# Patient Record
Sex: Male | Born: 1970
Health system: Southern US, Community
[De-identification: ages and names within clinical notes are randomized; demographics above are authoritative.]

## PROBLEM LIST (undated history)

## (undated) DIAGNOSIS — M51369 Other intervertebral disc degeneration, lumbar region without mention of lumbar back pain or lower extremity pain: Secondary | ICD-10-CM

## (undated) DIAGNOSIS — M722 Plantar fascial fibromatosis: Secondary | ICD-10-CM

## (undated) DIAGNOSIS — D1803 Hemangioma of intra-abdominal structures: Secondary | ICD-10-CM

## (undated) DIAGNOSIS — M1990 Unspecified osteoarthritis, unspecified site: Secondary | ICD-10-CM

## (undated) DIAGNOSIS — G4733 Obstructive sleep apnea (adult) (pediatric): Secondary | ICD-10-CM

## (undated) DIAGNOSIS — IMO0002 Reserved for concepts with insufficient information to code with codable children: Secondary | ICD-10-CM

## (undated) DIAGNOSIS — I10 Essential (primary) hypertension: Secondary | ICD-10-CM

## (undated) DIAGNOSIS — B350 Tinea barbae and tinea capitis: Secondary | ICD-10-CM

## (undated) DIAGNOSIS — G4730 Sleep apnea, unspecified: Secondary | ICD-10-CM

## (undated) DIAGNOSIS — M5136 Other intervertebral disc degeneration, lumbar region: Secondary | ICD-10-CM

## (undated) DIAGNOSIS — E663 Overweight: Secondary | ICD-10-CM

## (undated) HISTORY — DX: Other intervertebral disc degeneration, lumbar region without mention of lumbar back pain or lower extremity pain: M51.369

## (undated) HISTORY — PX: SPINE SURGERY: SHX786

## (undated) HISTORY — DX: Tinea barbae and tinea capitis: B35.0

## (undated) HISTORY — DX: Morbid (severe) obesity due to excess calories: E66.01

## (undated) HISTORY — DX: Other intervertebral disc degeneration, lumbar region: M51.36

## (undated) HISTORY — DX: Unspecified osteoarthritis, unspecified site: M19.90

## (undated) HISTORY — DX: Obstructive sleep apnea (adult) (pediatric): G47.33

## (undated) HISTORY — DX: Sleep apnea, unspecified: G47.30

## (undated) HISTORY — DX: Hemangioma of intra-abdominal structures: D18.03

## (undated) HISTORY — DX: Overweight: E66.3

## (undated) HISTORY — PX: APPENDECTOMY: SHX54

---

## 2003-12-27 HISTORY — PX: ROTATOR CUFF REPAIR: SHX139

## 2004-08-06 ENCOUNTER — Emergency Department (HOSPITAL_COMMUNITY): Admission: EM | Admit: 2004-08-06 | Discharge: 2004-08-06 | Payer: Self-pay | Admitting: Emergency Medicine

## 2005-07-15 ENCOUNTER — Ambulatory Visit: Payer: Self-pay | Admitting: Pulmonary Disease

## 2005-12-26 HISTORY — PX: LAPAROSCOPIC APPENDECTOMY: SHX408

## 2006-03-11 ENCOUNTER — Emergency Department (HOSPITAL_COMMUNITY): Admission: AD | Admit: 2006-03-11 | Discharge: 2006-03-11 | Payer: Self-pay | Admitting: Family Medicine

## 2006-05-25 ENCOUNTER — Observation Stay (HOSPITAL_COMMUNITY): Admission: EM | Admit: 2006-05-25 | Discharge: 2006-05-26 | Payer: Self-pay | Admitting: Family Medicine

## 2006-05-25 ENCOUNTER — Encounter (INDEPENDENT_AMBULATORY_CARE_PROVIDER_SITE_OTHER): Payer: Self-pay | Admitting: *Deleted

## 2006-06-02 ENCOUNTER — Encounter: Admission: RE | Admit: 2006-06-02 | Discharge: 2006-06-02 | Payer: Self-pay

## 2007-05-01 ENCOUNTER — Ambulatory Visit: Payer: Self-pay | Admitting: Pulmonary Disease

## 2007-06-13 ENCOUNTER — Ambulatory Visit (HOSPITAL_BASED_OUTPATIENT_CLINIC_OR_DEPARTMENT_OTHER): Admission: RE | Admit: 2007-06-13 | Discharge: 2007-06-13 | Payer: Self-pay | Admitting: Pulmonary Disease

## 2007-06-30 ENCOUNTER — Ambulatory Visit: Payer: Self-pay | Admitting: Pulmonary Disease

## 2007-08-13 ENCOUNTER — Ambulatory Visit: Payer: Self-pay | Admitting: Pulmonary Disease

## 2008-02-25 ENCOUNTER — Emergency Department (HOSPITAL_COMMUNITY): Admission: EM | Admit: 2008-02-25 | Discharge: 2008-02-25 | Payer: Self-pay | Admitting: Emergency Medicine

## 2009-03-04 ENCOUNTER — Ambulatory Visit: Payer: Self-pay | Admitting: Pulmonary Disease

## 2009-03-04 DIAGNOSIS — M51379 Other intervertebral disc degeneration, lumbosacral region without mention of lumbar back pain or lower extremity pain: Secondary | ICD-10-CM | POA: Insufficient documentation

## 2009-03-04 DIAGNOSIS — D1803 Hemangioma of intra-abdominal structures: Secondary | ICD-10-CM

## 2009-03-04 DIAGNOSIS — M5137 Other intervertebral disc degeneration, lumbosacral region: Secondary | ICD-10-CM

## 2009-03-04 DIAGNOSIS — G4733 Obstructive sleep apnea (adult) (pediatric): Secondary | ICD-10-CM | POA: Insufficient documentation

## 2009-03-04 DIAGNOSIS — E663 Overweight: Secondary | ICD-10-CM | POA: Insufficient documentation

## 2009-03-04 DIAGNOSIS — B350 Tinea barbae and tinea capitis: Secondary | ICD-10-CM

## 2009-03-04 DIAGNOSIS — M25569 Pain in unspecified knee: Secondary | ICD-10-CM | POA: Insufficient documentation

## 2009-07-27 ENCOUNTER — Encounter: Payer: Self-pay | Admitting: Pulmonary Disease

## 2009-12-28 ENCOUNTER — Encounter: Admission: RE | Admit: 2009-12-28 | Discharge: 2009-12-28 | Payer: Self-pay | Admitting: Occupational Medicine

## 2009-12-29 ENCOUNTER — Encounter: Admission: RE | Admit: 2009-12-29 | Discharge: 2009-12-29 | Payer: Self-pay | Admitting: Occupational Medicine

## 2010-06-09 ENCOUNTER — Ambulatory Visit: Payer: Self-pay | Admitting: Pulmonary Disease

## 2010-06-23 ENCOUNTER — Encounter: Payer: Self-pay | Admitting: Pulmonary Disease

## 2010-09-06 ENCOUNTER — Telehealth (INDEPENDENT_AMBULATORY_CARE_PROVIDER_SITE_OTHER): Payer: Self-pay | Admitting: *Deleted

## 2011-01-16 ENCOUNTER — Encounter: Payer: Self-pay | Admitting: Pulmonary Disease

## 2011-01-25 NOTE — Progress Notes (Signed)
Summary: requesting rx - LMTCB x 2  Phone Note Call from Patient Call back at [phone]   Caller: Spouse-Laura Call For: Nicholas Stevens Reason for Call: Talk to Nurse Summary of Call: Drainage at throat,light headache, sinus infection, cough.  Can you call something in? [facility] Initial call taken by: Eugene Gavia,  September 06, 2010 3:27 PM  Follow-up for Phone Call        ATC above number - [facility] Crystal Yetta Barre RN  September 06, 2010 3:30 PM  LMTCBx2 at number given. Carron Curie CMA  September 07, 2010 10:38 AM  Pt c/o sinus headache, yellow nasal dranage, dry hacky cough for 2-3 days. Would like RX called to pharmacy. Please advise.Michel Bickers [facility]  September 07, 2010 12:18 PM  Additional Follow-up for Phone Call Additional follow up Details #1::        per SN----ok for pt to have augmentin 875mg   #20  1 by mouth two times a day until gone, use mucinex max otc  1 by mouth two times a day with plenty of fluids.  thanks. Randell Loop [facility]  September 07, 2010 1:42 PM     Additional Follow-up for Phone Call Additional follow up Details #2::    Rx was sent.  Spoke with pt's spouse Vernona Rieger and notified of the above recs per SN. Follow-up by: Vernie Murders,  September 07, 2010 2:00 PM  New/Updated Medications: AUGMENTIN 875-125 MG TABS (AMOXICILLIN-POT CLAVULANATE) 1 by mouth two times a day until gone Prescriptions: AUGMENTIN 875-125 MG TABS (AMOXICILLIN-POT CLAVULANATE) 1 by mouth two times a day until gone  #20 x 0   Entered by:   Vernie Murders   Authorized by:   [name] MD   Signed by:   Vernie Murders on 09/07/2010   Method used:   [facility]        [facility]  [address] [phone] (retail)       [address]       [place], [place]  [address]       Ph: [phone]       Fax: [phone]   RxID:   [id number]
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
## 2011-01-25 NOTE — Letter (Signed)
Summary: [facility]  [facility]   Imported By: Lester Franklin 07/08/2010 09:40:55  _____________________________________________________________________  External Attachment:    Type:   Image     Comment:   External Document

## 2011-04-06 ENCOUNTER — Ambulatory Visit: Payer: Self-pay | Admitting: Pulmonary Disease

## 2011-04-25 ENCOUNTER — Encounter: Payer: Self-pay | Admitting: Pulmonary Disease

## 2011-04-27 ENCOUNTER — Ambulatory Visit (INDEPENDENT_AMBULATORY_CARE_PROVIDER_SITE_OTHER): Payer: 59 | Admitting: Pulmonary Disease

## 2011-04-27 ENCOUNTER — Encounter: Payer: Self-pay | Admitting: Pulmonary Disease

## 2011-04-27 DIAGNOSIS — M5137 Other intervertebral disc degeneration, lumbosacral region: Secondary | ICD-10-CM

## 2011-04-27 DIAGNOSIS — D1803 Hemangioma of intra-abdominal structures: Secondary | ICD-10-CM

## 2011-04-27 DIAGNOSIS — M51379 Other intervertebral disc degeneration, lumbosacral region without mention of lumbar back pain or lower extremity pain: Secondary | ICD-10-CM

## 2011-04-27 DIAGNOSIS — G4733 Obstructive sleep apnea (adult) (pediatric): Secondary | ICD-10-CM

## 2011-04-27 DIAGNOSIS — R41840 Attention and concentration deficit: Secondary | ICD-10-CM | POA: Insufficient documentation

## 2011-04-27 DIAGNOSIS — Z0000 Encounter for general adult medical examination without abnormal findings: Secondary | ICD-10-CM

## 2011-04-27 DIAGNOSIS — M25569 Pain in unspecified knee: Secondary | ICD-10-CM

## 2011-04-27 DIAGNOSIS — E663 Overweight: Secondary | ICD-10-CM

## 2011-04-27 NOTE — Patient Instructions (Signed)
Big-D, it was good seeing you again...  We decided to check your CPAP apparatus & we will ask [facility] to contact you about this...  Please return to our lab one morning soon for FASTING blood work...    Then call the PHONE TREE in a few days for your results...    Dial [id number] & when prompted enter your patient number followed by the # symbol...    Your patient number is:  [id number]#  Try some of our tricks regarding weight loss... Call for any concerns or if you want to proceed w/ the Neurology eval at any time.Marland KitchenMarland Kitchen

## 2011-05-08 ENCOUNTER — Encounter: Payer: Self-pay | Admitting: Pulmonary Disease
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
## 2011-05-10 NOTE — Assessment & Plan Note (Signed)
[facility]                             PULMONARY OFFICE NOTE   NAME:Nicholas Stevens, Bean                  MRN:          [id number]  DATE:08/13/2007                            DOB:          04/18/1971    HISTORY OF PRESENT ILLNESS:  Patient is a very pleasant 40 year old  black gentleman who I have been asked to see for sleep apnea.  Patient  underwent nocturnal polysomnography on June 13, 2007 where he was found  to have an apnea-hypopnea index of 53 events per hour and O2  desaturation as low as 76% with his events.  Patient was placed on CPAP  at a level of 10 and asked to follow up with me.  Prior to CPAP  initiation patient had loud snoring as well as pauses in sleep according  to his wife.  He typically went to bed between 11 and 12 and got up at 4  a.m. to start his day.  He felt rested and that he did not have any  inappropriate daytime sleepiness.  The patient works as a Physicist, medical.  Of note, the patient's weight is up about 40 pounds from 2  years ago.  Patient was started on CPAP at a pressure of 10 with a nasal  CPAP mask.  Despite this he feels like he has smothering.  He has the  ramp button set to 45 minutes but still can not initiate sleep even over  this time period.  He has only used 3 nights over the last 1 month.  Patient feels the mask is comfortable and without significant leaks.   PAST MEDICAL HISTORY:  1. Appendectomy in 2007.  2. Sleep apnea, stated above.  3. Hypertension.   CURRENT MEDICATIONS:  None.   PATIENT HAS NO KNOWN DRUG ALLERGIES.   SOCIAL HISTORY:  He has never smoked, he is married, he works as a  Emergency planning/management officer.   FAMILY HISTORY:  Noncontributory in first degree relatives.   REVIEW OF SYSTEMS:  As per history of present illness.  Also, see  patient intake form documented in the chart.   PHYSICAL EXAMINATION:  GENERAL:  He is an obese black male in no acute  distress.  Blood pressure is 132/94,  pulse 67, temperature 98, weight is  301 pounds, he is 6 foot 2-1/2, O2 saturation on room air is 96%.  HEENT:  Pupils equal, round and reactive to light and accommodation.  Extraocular muscles are intact.  Nares with deviated septum to the left  but patent.  Oropharynx shows moderate elongation of the soft palate  with a normal uvula.  There was significant tissue redundancy in the  posterior pharynx.  NECK:  Supple with JVD or lymphadenopathy, no palpitations thyromegaly.  CHEST:  Totally clear.  CARDIO:  Reveals regular rate and rhythm; no murmurs, rubs or gallops.  ABDOMEN:  Soft, nontender with good bowel sounds.  Genital exam, rectal exam, breast exam was not done and not indicated.  LOWER EXTREMITIES:  Without edema, pulses are intact distally.  NEUROLOGICALLY:  Alert and oriented with no obvious motor deficits.   IMPRESSION:  Severe obstructive sleep apnea.  I have had a long  conversation with him about the pathophysiology of sleep apnea and how  it is related to his weight gain.  I have talked with him about the long  term cardiovascular side effects and that we need to treat this  aggressively.  His best treatment options are continuous positive airway  pressure coupled with weight loss.  At this point in time I think we  need to work on continuous positive airway pressure tolerance and  desensitization.  We also need to optimize his pressure   PLAN:  1. Will use a sedative hypnotic in the form of Ambien 12.5 mg CR      nightly for the next 14 days to help with sleep initiation.  2. Patient is to do desensitization in the early evening hours for      approximately 30-45 minutes while he is watching TV and before he      goes to bed.  3. Will get an auto titrate device for the next 2 weeks to see if he      tolerates that better, but also to optimize his pressure.  4. The patient understands that he needs to work aggressively on      weight loss.  5. I will call the  patient after his 2 week auto titration once I get      his downloaded result.     Barbaraann Share, MD,FCCP  Electronically Signed    [facility]  DD: 08/13/2007  DT: 08/13/2007  Job #: [id number]   cc:   Lonzo Cloud. Kriste Basque, MD

## 2011-05-10 NOTE — Procedures (Signed)
Nicholas, Stevens           ACCOUNT NO.:  [id number]   MEDICAL RECORD NO.:  [id number]          PATIENT TYPE:  OUT   LOCATION:  SLEEP CENTER                 FACILITY[facility]  [facility]   PHYSICIAN:  Barbaraann Share, MD,FCCPDATE OF BIRTH:  November 06[birth year] 1969   DATE OF STUDY:  06/13/2007                            NOCTURNAL POLYSOMNOGRAM   REFERRING PHYSICIAN:  Lonzo Cloud. Kriste Basque, MD   INDICATION FOR STUDY:  Hypersomnia with sleep apnea.   EPWORTH SLEEPINESS SCORE:  1   MEDICATIONS:   SLEEP ARCHITECTURE:  The patient had total sleep time of 335 minutes  with only 4.5 minutes of slow-wave sleep and 50 minutes of REM, which is  decreased.  Sleep onset latency is mildly prolonged at 35 minutes and  REM onset was normal.  Sleep efficiency was fairly good at 92%.   RESPIRATORY DATA:  The patient was found to have 90 obstructive  hypopneas and 173 obstructive apneas with 32 central apneas for an  apnea/hypopnea index of 53 events per hour.  The events were not  positional and there was loud snoring noted throughout.   OXYGEN DATA:  The patient had O2 desaturation as low as 76% with his  obstructive events.   CARDIAC DATA:  No clinically significant cardiac arrhythmias were noted.   MOVEMENT-PARASOMNIA:  No significant leg movements or abnormal behaviors  were noted.   IMPRESSIONS-RECOMMENDATIONS:  Severe obstructive sleep apnea/hypopnea  syndrome with an apnea/hypopnea index of 53 events per hour and O2  desaturation as low as 76%.  Treatment for this degree of sleep apnea  should focus primarily on weight-loss, if applicable, as well as C-PAP.      Barbaraann Share, MD,FCCP  Diplomate, American Board of Sleep  Medicine  Electronically Signed     [facility]  D:  06/28/2007 18:07:39  T:  06/29/2007 10:54:02  Job:  [id number]   cc:   Lonzo Cloud. Kriste Basque, MD  [address]  [place]  [place] [address]

## 2011-05-13 NOTE — Op Note (Signed)
Nicholas Stevens, Nicholas Stevens           ACCOUNT NO.:  [id number]   MEDICAL RECORD NO.:  [id number]          PATIENT TYPE:  INP   LOCATION:  5008                         FACILITY:  [facility]   PHYSICIAN:  Ollen Gross. Vernell Morgans, M.D. DATE OF BIRTH:  October 26, 1971   DATE OF PROCEDURE:  05/28/2006  DATE OF DISCHARGE:  05/26/2006                                 OPERATIVE REPORT   PREOPERATIVE DIAGNOSIS:  Appendicitis and liver lesions.   POSTOPERATIVE DIAGNOSIS:  Appendicitis and liver lesions.   PROCEDURE:  Laparoscopic appendectomy.   SURGEON:  Dr. Carolynne Edouard.   ANESTHESIA:  General endotracheal.   PROCEDURE:  After informed consent was obtained, the patient brought to the  operating room, placed in supine position operating room table.  After  induction of general anesthesia, the patient's abdomen was prepped with  Betadine and draped in usual sterile manner.  The area below the umbilicus  infiltrated with 0.25% Marcaine and a small incision was made with a 15  blade knife.  This incision was carried down through the subcutaneous tissue  bluntly with a hemostat and [employer] retractors until the linea alba was  identified.  The linea alba was incised with 15 blade knife and each side  was grasped Kocher clamps, elevated anteriorly.  The preperitoneal space was  then probed bluntly hemostat to the peritoneum was opened and access was  gained to the abdominal cavity.  The 0 Vicryl pursestring stitch was placed  on the fascia surrounding the opening.  A Hasson cannula was placed through  the opening and anchored in place with the previously placed Vicryl  pursestring stitch.  The abdomen was then insufflated carbon dioxide without  difficulty.  The laparoscope was inserted through the Xylocaine and  initially the liver was examined.  There were no external signs of any  abnormality to the liver, although the patient had several liver lesions on  a CT scan.  At this point the patient was placed in  Trendelenburg position  rotated slightly with the right side up.  The suprapubic region was then  infiltrated 0.25% Marcaine.  A small incision was made with a 15 blade  knife.  A 10 mm port was then placed bluntly through this incision into the  abdominal cavity under direct vision just above the umbilicus.  Another site  was chosen for 5 mV port.  This area was infiltrated 0.25% Marcaine.  A  small stab incision was made with a 15 blade knife and a 5 mm port was  placed bluntly through this incision into the abdominal cavity under direct  vision.  The laparoscope was then moved to the suprapubic port.  The right  lower quadrant was inspected and enlarged.  An inflamed appendix was readily  identified.  It was easily able to be held up in the air with a Glassman  grasper.  The mesoappendix was then taken down sharply with the Harmonic  scalpel until the base of the appendix at its junction with the cecum was  readily identified and cleared of any tissue debris.  Laparoscopic ATB 45  blue load stapler was then placed through  the Hasson cannula and across the  base of the appendix at its junction with the cecum clamped and fired,  thereby dividing the base the appendix between staple lines.  Laparoscopic  bag was then inserted through the Hasson cannula.  The appendix placed  within the bag and bag was sealed.  The abdomen was then irrigated with  copious amounts of saline.  The appendix in the bag was then removed through  the infraumbilical port with the Hasson cannula without difficulty.  The  fascial defect was closed with previously placed Vicryl pursestring stitch  as well with another interrupted figure-of-eight 0 Vicryl stitch.  The rest  of the staple line again was examined and was found be hemostatic and intact  and healthy.  The rest of ports removed under direct vision.  The gas was  allowed to escape.  The skin incisions were all closed with interrupted 4-  0 Monocryl  subcuticular stitches.  Benzoin, Steri-Strips were then applied.  The patient tolerated well.  At the end of the case, all needle, sponge and  instrument counts correct.  The patient was awakened, taken to recovery in  stable condition.      Ollen Gross. Vernell Morgans, M.D.  Electronically Signed     PST/MEDQ  D:  05/28/2006  T:  05/29/2006  Job:  [id number]

## 2011-05-13 NOTE — H&P (Signed)
NAMEROGERIO, BOUTELLE           ACCOUNT NO.:  [id number]   MEDICAL RECORD NO.:  [id number]          PATIENT TYPE:  INP   LOCATION:  2550                         FACILITY:  [facility]   PHYSICIAN:  Ollen Gross. Vernell Morgans, M.D. DATE OF BIRTH:  October 11, 1971   DATE OF ADMISSION:  05/24/2006  DATE OF DISCHARGE:                                HISTORY & PHYSICAL   HISTORY OF PRESENT ILLNESS:  Mr. Probert is a 40 year old black male who  presents tonight with right lower quadrant pain for the last day.  He denies  any fevers at home.  He has had no nausea or vomiting at home.  The pain has  been fairly severe for him, to the point where he came to the emergency  department for more evaluation.  No chest pain or shortness of breath.  No  diarrhea or dysuria.  His other review of systems are unremarkable.   PAST MEDICAL HISTORY:  None.   PAST SURGICAL HISTORY:  Significant for arthroscopic surgery on his right  shoulder.   MEDICATIONS:  None.   ALLERGIES:  No known drug allergies..   SOCIAL HISTORY:  He denies the use of alcohol or tobacco products.   FAMILY HISTORY:  Noncontributory.   PHYSICAL EXAMINATION:  VITAL SIGNS:  Temperature is 97.3, blood pressure  135/87, pulse 75.  GENERAL:  He is a well-developed, well-nourished, black male in no acute  distress.  SKIN:  Warm and dry with no jaundice.  HEENT:  Eyes - extraocular muscles are intact.  Pupils equal, round and  reactive to light.  Sclerae are non-icteric.  LUNGS:  Clear bilaterally with no use of accessory respiratory muscles.  HEART:  Regular rate and rhythm with an impulse in the left chest.  ABDOMEN:  Soft with focal right lower quadrant pain but no evidence of  peritonitis, no palpable mass or hepatosplenomegaly.  EXTREMITIES:  No clubbing, cyanosis, or edema.  Good strength in his arms  and legs.  PSYCHOLOGICALLY:  He is alert and oriented x3 with no history of anxiety or  depression.   LABORATORY DATA:  On review of his lab  work, it was significant for a normal  white count of 5.7, hemoglobin 14.5, platelets 271,000.  Urinalysis was  negative.  CT scan was reviewed with the radiologist and did show  appendicitis.  He also has some fairly large, ill-defined lesions in his  liver.  These are difficult to characterize at this point.   ASSESSMENT AND PLAN:  This is a 40 year old black male with what appears to  be acute appendicitis.  Because of the risk of perforation and sepsis, I do  think he would benefit from having his appendix removed tonight.  I have  explained to him in detail the risks and benefits of the operation to remove  the appendix, as well as some of the technical aspects, and he understands  and wishes to proceed.  He also has some ill-defined liver lesions on the CT  scan.  These will need further work-up with MRI, possibly tomorrow, and will  follow up on this.  Ollen Gross. Vernell Morgans, M.D.  Electronically Signed     PST/MEDQ  D:  05/25/2006  T:  05/25/2006  Job:  [id number]

## 2011-09-28 ENCOUNTER — Telehealth: Payer: Self-pay | Admitting: Pulmonary Disease

## 2011-09-28 NOTE — Telephone Encounter (Signed)
Pt son Farhad Burleson [birth year]03[birth year] scheduled to see SN on 11-02-11 as new patient.  Carron Curie, CMA

## 2011-09-28 NOTE — Telephone Encounter (Signed)
Per SN---ok to see this pt for primary care--we see his dad and mom.  thanks

## 2012-02-15 ENCOUNTER — Telehealth: Payer: Self-pay | Admitting: Pulmonary Disease

## 2012-02-15 MED ORDER — NEOMYCIN-POLYMYXIN-GRAMICIDIN 2.5-10000-0.025 OP SOLN: OPHTHALMIC | Status: DC

## 2012-02-15 NOTE — Telephone Encounter (Signed)
Per SN call in neosporin opthalmic solution 1-2 gtts in the affected eye QID 1 bottle x 1 refill. I have spoke with pt and is aware of SN recs. RX has been called in and nothing further was needed

## 2012-02-15 NOTE — Telephone Encounter (Signed)
Called spoke with patient who c/o left eye pinkness/redness, irritation and drainage with yellow and puss x2days.  Reported has tried an OTC eye allergy solution with no relief.  Requesting rx.  Dr Kriste Basque please advise, thanks.  NKDA.  [facility].  Last ov with SN 5.2.12, follow up in 1 year with no pending appts.

## 2013-03-22 ENCOUNTER — Ambulatory Visit (INDEPENDENT_AMBULATORY_CARE_PROVIDER_SITE_OTHER): Payer: 59 | Admitting: Adult Health

## 2013-03-22 ENCOUNTER — Ambulatory Visit: Payer: 59 | Admitting: Adult Health

## 2013-03-22 ENCOUNTER — Encounter: Payer: Self-pay | Admitting: Adult Health

## 2013-03-22 ENCOUNTER — Other Ambulatory Visit (INDEPENDENT_AMBULATORY_CARE_PROVIDER_SITE_OTHER): Payer: 59

## 2013-03-22 VITALS — BP 142/92 | HR 73 | Temp 98.5°F | Ht 74.5 in | Wt 340.8 lb

## 2013-03-22 DIAGNOSIS — R358 Other polyuria: Secondary | ICD-10-CM

## 2013-03-22 DIAGNOSIS — N529 Male erectile dysfunction, unspecified: Secondary | ICD-10-CM

## 2013-03-22 DIAGNOSIS — R51 Headache: Secondary | ICD-10-CM

## 2013-03-22 DIAGNOSIS — G4733 Obstructive sleep apnea (adult) (pediatric): Secondary | ICD-10-CM

## 2013-03-22 DIAGNOSIS — E663 Overweight: Secondary | ICD-10-CM

## 2013-03-22 LAB — CBC WITH DIFFERENTIAL/PLATELET
Basophils Relative: 0.4 % (ref 0.0–3.0)
Eosinophils Relative: 1.6 % (ref 0.0–5.0)
MCHC: 34.2 g/dL (ref 30.0–36.0)
MCV: 82.4 fl (ref 78.0–100.0)
Monocytes Absolute: 0.4 10*3/uL (ref 0.1–1.0)
Monocytes Relative: 8.7 % (ref 3.0–12.0)
Neutro Abs: 2.6 10*3/uL (ref 1.4–7.7)
Neutrophils Relative %: 50.2 % (ref 43.0–77.0)

## 2013-03-22 LAB — BASIC METABOLIC PANEL
BUN: 9 mg/dL (ref 6–23)
CO2: 28 mEq/L (ref 19–32)
Chloride: 102 mEq/L (ref 96–112)
Glucose, Bld: 89 mg/dL (ref 70–99)

## 2013-03-22 LAB — URINALYSIS, ROUTINE W REFLEX MICROSCOPIC
Specific Gravity, Urine: 1.02 (ref 1.000–1.030)
Total Protein, Urine: NEGATIVE
Urine Glucose: NEGATIVE
Urobilinogen, UA: 0.2 (ref 0.0–1.0)
pH: 7 (ref 5.0–8.0)

## 2013-03-22 LAB — PSA: PSA: 0.56 ng/mL (ref 0.10–4.00)

## 2013-03-22 LAB — HEPATIC FUNCTION PANEL
Alkaline Phosphatase: 66 U/L (ref 39–117)
Bilirubin, Direct: 0.2 mg/dL (ref 0.0–0.3)
Total Bilirubin: 0.7 mg/dL (ref 0.3–1.2)
Total Protein: 7.6 g/dL (ref 6.0–8.3)

## 2013-03-22 LAB — TESTOSTERONE: Testosterone: 250.67 ng/dL — ABNORMAL LOW (ref 350.00–890.00)

## 2013-03-22 LAB — TSH: TSH: 1.57 u[IU]/mL (ref 0.35–5.50)

## 2013-03-22 NOTE — Assessment & Plan Note (Signed)
Cont on CPAP  Cont on wt loss
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
## 2013-03-22 NOTE — Patient Instructions (Addendum)
Check blood pressure 3 times weekly , call if >140/90 on consistent basis.  Avoid decongestants, NSAIDS (ie advil, motrin, aleve, etc)  We will refer you to [facility] for evaluation for weight loss options.  Continue with exercise and diet.  Low salt diet.  I will call with lab results.  follow up Dr. Kriste Basque  In 2months and As needed

## 2013-03-22 NOTE — Assessment & Plan Note (Signed)
Persistent wt gain despite aggressive exercise and diet  Labs pending with tsh and testosterone.  We will refer you to [facility] for evaluation for weight loss options.  Continue with exercise and diet.  Low salt diet.  I will call with lab results.  follow up Dr. Kriste Basque  In 2months and As needed

## 2013-03-22 NOTE — Assessment & Plan Note (Signed)
Isolated episode of ED  Check PSA and testosterone  If persists refer to URO

## 2013-03-27 NOTE — Progress Notes (Signed)
Quick Note:  LMOM TCB x1. ______ 

## 2013-03-28 ENCOUNTER — Telehealth: Payer: Self-pay | Admitting: Adult Health

## 2013-03-28 NOTE — Telephone Encounter (Signed)
Result Note    Labs are ok    BS are nml   UA and PSA ok    Testosterone is slightly low, if continues to have symptoms    Will repeat on return ov with am fasting labs to confirm low level.    Make sure he follows up for CPX with Dr. Kriste Basque    -- I spoke with patient about results and he verbalized understanding and had no questions`

## 2013-04-03 ENCOUNTER — Telehealth: Payer: Self-pay | Admitting: Pulmonary Disease

## 2013-04-03 MED ORDER — TESTOSTERONE 20.25 MG/ACT (1.62%) TD GEL: 2.0000 | Freq: Every day | TRANSDERMAL | Status: DC

## 2013-04-03 MED ORDER — SILDENAFIL CITRATE 100 MG PO TABS: ORAL_TABLET | ORAL | Status: DC

## 2013-04-03 NOTE — Telephone Encounter (Signed)
Erectile dysfunction - Julio Sicks, NP at 03/22/2013  1:48 PM    Status: Written Related Problem: Erectile dysfunction           Isolated episode of ED   Check PSA and testosterone   If persists refer to URO     ----  I spoke with pt and he states he is still have the ED problem and was told to call back if this persists. Since TP is not here please advise SN thanks Pending 05/22/13 APPT

## 2013-04-03 NOTE — Telephone Encounter (Signed)
Per SN----   Testosterone level is low at 250---normal range is 350-890.  PSA was normal.  Needs a trial of  androgel 1.62   2 pumps rubbed into the skin daily and a trial of viagra 100 mg   #10  As needed with 1 refill.   Pt will need rov with SN in 6-8 weeks for recheck.  thanks

## 2013-04-03 NOTE — Addendum Note (Signed)
Addended by: Boone Master E on: 04/03/2013 04:03 PM   Modules accepted: Orders

## 2013-04-03 NOTE — Telephone Encounter (Signed)
Note signed in error Called spoke with patient, advised of SN's recs as stated below Pt okay with these recs and verbalized his understanding Pt has ov already scheduled with SN for 5.28.14 @ 2pm > will keep appt Rx's sent to verified pharmacy Pt to call if anything further is needed

## 2013-04-05 ENCOUNTER — Telehealth: Payer: Self-pay | Admitting: Adult Health

## 2013-04-05 MED ORDER — TESTOSTERONE 20.25 MG/ACT (1.62%) TD GEL: 2.0000 | Freq: Every day | TRANSDERMAL | Status: DC

## 2013-04-05 NOTE — Telephone Encounter (Signed)
rx has been called to the pharmacy---called and spoke with pt and he is aware of rx called to the pharmacy. Nothing further is needed.

## 2013-04-29 ENCOUNTER — Telehealth: Payer: Self-pay | Admitting: Pulmonary Disease

## 2013-04-29 ENCOUNTER — Encounter: Payer: Self-pay | Admitting: Pulmonary Disease

## 2013-04-29 NOTE — Telephone Encounter (Signed)
I spoke with pt. He stated [facility] is requesting SN to fax a letter of medical necessity for weight loss surgery. He has appt with the surgeon on 05/10/13 so he needs the letter  ASAP. Please advise SN thanks

## 2013-04-30 NOTE — Telephone Encounter (Signed)
Per SN---  Letter has been done and pt is aware.  Letter has been faxed over to Dr. Daphine Deutscher.  Nothing further is needed.

## 2013-05-10 ENCOUNTER — Ambulatory Visit (INDEPENDENT_AMBULATORY_CARE_PROVIDER_SITE_OTHER): Payer: 59 | Admitting: Surgery

## 2013-05-10 ENCOUNTER — Other Ambulatory Visit (INDEPENDENT_AMBULATORY_CARE_PROVIDER_SITE_OTHER): Payer: Self-pay

## 2013-05-10 ENCOUNTER — Encounter (INDEPENDENT_AMBULATORY_CARE_PROVIDER_SITE_OTHER): Payer: Self-pay | Admitting: Surgery

## 2013-05-10 VITALS — BP 142/80 | HR 60 | Temp 97.8°F | Resp 16 | Ht 75.0 in | Wt 335.4 lb

## 2013-05-10 DIAGNOSIS — G4733 Obstructive sleep apnea (adult) (pediatric): Secondary | ICD-10-CM

## 2013-05-10 DIAGNOSIS — E663 Overweight: Secondary | ICD-10-CM

## 2013-05-10 DIAGNOSIS — Z6841 Body Mass Index (BMI) 40.0 and over, adult: Secondary | ICD-10-CM

## 2013-05-10 NOTE — Patient Instructions (Signed)
Sleeve Gastrectomy A sleeve gastrectomy is an operation that removes a large portion of your stomach. This operation is performed to help you lose weight. You lose weight with this operation because it restricts the amount of food you can eat. Your stomach will be a narrow tube after the operation (the size of a banana). Your stomach will hold much less food than your normal stomach. Also, the portion of your stomach that is removed produces a hormone that causes hunger. You are a candidate for this operation if you have morbid obesity, defined as a body mass index (BMI) greater than 40. You may also be a candidate if you have severe obesity related diseases such as: diabetes mellitus 2, obstructive sleep apnea, or cardiopulmonary disease (heart and lung) with a BMI greater than 35. You will need to talk with your surgeon and insurance company to find out if this surgery is right for you.  Sleeve gastrectomy is a good alternative to other treatments of obesity (bariatric) operations. It does not require any adjustments after the operation compared with an adjustable gastric band. Also, it is safer than a gastric bypass. RISKS AND COMPLICATIONS Some of the problems that can occur from this procedure include:  Infection. A germ starts growing in the incision sites. This can usually be treated with antibiotics.  Bleeding. This can occur with any surgery. Your surgeon will take all precautions to minimize this risk.  Damage to tissue or organs in the area may occur. If there is excessive damage, the surgeon may need to change to an open surgery. In this case, one large incision will be made in the center of your abdomen.  Leakage. The fluid in your stomach may leak into the abdominal cavity. If this happens, you may need another surgery to fix the leak. BEFORE THE PROCEDURE Before your operation you will meet with your surgeon and their team for the treatment of obesity. Here you will find out if you are  a candidate for bariatric surgery. The risks and the benefits of the operation will be explained. You will also meet a:  Dietician who will guide you with your preoperative and postoperative diet.  An internal medical doctor to manage your obesity related illnesses.  A psychology team to help with cravings or other mental difficulties. In addition:  You will be directed to have certain lab work and x-rays performed.  You will schedule a special test called a manometry. This test evaluates your esophagus and how it moves.  You will be placed on a special liquid diet two to three weeks before your operation. This diet helps you lose weight before the operation and decrease the amount of fat in the abdomen. It makes the operation easier for the surgeon and safer for the patient. The dietician will share the details of this with you. Before your operation:  Make sure you follow your surgeon's instructions exactly. Stop or continue medications they recommend.  Do not eat or drink anything after midnight.  Arrive at the hospital 1 hour before your surgery for check in.  Shower the morning of your operation. PROCEDURE  Most sleeve gastrectomies are performed using a laparoscope. A laparoscope is a thin, lighted, pencil-sized tube. Once you are anesthetized (asleep), the surgeon inflates your belly (abdomen) with a gas (carbon dioxide) that makes room to operate. It also makes your organs easier to see. The laparoscope is inserted into the abdomen through a small incision. Other small instruments are inserted into the abdomen  through other small incisions. During the operation, the stomach is divided using a stapler. Part of the stomach is removed through one of the incisions. The remaining stomach is reinforced using a stitch (suture) and surgical glue to prevent leakage of the gastric contents. At the end of the procedure, the gas is removed from the inside of your abdomen. The incisions are closed  with stitches. These may be covered with a dressing or left open. Because the incisions are small, there is usually minimal discomfort. You will wake up in a recovery room. Once your anesthesia has worn off, you will be moved to your hospital room. AFTER THE PROCEDURE  You will stay in the hospital, on average, for two days.  You will be given pain medication and anti-nausea medication.  You may have a drain from one of the incisions in your abdomen. This drain will stay in place until your first postoperative visit.  The nursing staff will assist you in getting out of bed the day of, or one day after, your surgery.  You will start on a liquid diet, the first day after your operation. The dietician will recommend this diet.  Taking deep breaths and coughing is very important to avoid pneumonia. Document Released: 10/09/2009 Document Revised: 03/05/2012 Document Reviewed: 10/09/2009 [facility] Patient Information 2013 [place], [place].

## 2013-05-10 NOTE — Progress Notes (Signed)
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 After discussions of both with pluses and minuses being laid up he is leaning strongly toward a laparoscopic sleeve gastrectomy. He is oriented a sleep study which the tonsil obstructive sleep apnea. He does not have diabetes. We will begin him on the pathway toward a sleeve gastrectomy.  Past Medical History  Diagnosis Date  . OSA (obstructive sleep apnea)   . Overweight   . Liver hemangioma   . Knee pain   . DDD (degenerative disc disease), lumbar   . Tinea barbae     Past Surgical History  Procedure Laterality Date  . Rotator cuff repair  2005    Dr Lajoyce Corners  . Laparoscopic appendectomy  2007    Dr Carolynne Edouard    Current Outpatient Prescriptions  Medication Sig Dispense Refill  . Multiple Vitamins-Minerals (MENS MULTIVITAMIN PLUS PO) Take 1 tablet by mouth daily.        . sildenafil (VIAGRA) 100 MG tablet Take 1 tablet by mouth when needed 1 hour prior to activity  10 tablet  1  . Testosterone (ANDROGEL PUMP) 20.25 MG/ACT (1.62%) GEL Place 2 Squirts onto the skin daily.  75 g  1   No current facility-administered medications for this visit.   Review of patient's allergies indicates no known allergies. Family History  Problem Relation Age of Onset  . Liver disease Father     ETOH, Hep C  . Hypertension Father   . Other Mother     hx of DJD   Social History:   reports that he has never smoked. He has never used smokeless tobacco. He reports that he does not drink alcohol or use illicit drugs.   REVIEW OF SYSTEMS - PERTINENT POSITIVES ONLY: Noncontributory  Physical Exam:   Blood pressure 142/80, pulse 60, temperature 97.8 F (36.6 C), temperature source Oral, resp. rate 16, height 6\' 3"  (1.905 m), weight 335 lb 6.4 oz  (152.136 kg). Body mass index is 41.92 kg/(m^2).  Gen:  WDWN African American male NAD  Neurological: Alert and oriented to person, place, and time. Motor and sensory function is grossly intact  Head: Normocephalic and atraumatic.  Abdomen: Obese and nontender.   LABORATORY RESULTS: No results found for this or any previous visit (from the past 48 hour(s)).  RADIOLOGY RESULTS: No results found.  Problem List: Patient Active Problem List   Diagnosis Date Noted  . Erectile dysfunction 03/22/2013  . Concentration deficit 04/27/2011  . TINEA BARBAE 03/04/2009  . LIVER HEMANGIOMA 03/04/2009  . OVERWEIGHT 03/04/2009  . OBSTRUCTIVE SLEEP APNEA 03/04/2009  . KNEE PAIN 03/04/2009  . DEGENERATIVE DISC DISEASE, LUMBAR SPINE 03/04/2009    Assessment & Plan: Morbid obesity; will work toward sleeve gastrectomy    [name], MD, [facility] [phone] beeper [phone]  05/10/2013 4:24 PM

## 2013-05-22 ENCOUNTER — Ambulatory Visit: Payer: 59 | Admitting: Pulmonary Disease

## 2013-06-03 ENCOUNTER — Encounter: Payer: 59 | Attending: Surgery | Admitting: *Deleted

## 2013-06-03 ENCOUNTER — Encounter: Payer: Self-pay | Admitting: *Deleted

## 2013-06-03 VITALS — Ht 75.0 in | Wt 327.7 lb

## 2013-06-03 DIAGNOSIS — E663 Overweight: Secondary | ICD-10-CM

## 2013-06-03 DIAGNOSIS — Z713 Dietary counseling and surveillance: Secondary | ICD-10-CM | POA: Insufficient documentation

## 2013-06-03 DIAGNOSIS — Z01818 Encounter for other preprocedural examination: Secondary | ICD-10-CM | POA: Insufficient documentation

## 2013-06-03 NOTE — Patient Instructions (Addendum)
   Follow Pre-Op Nutrition Goals to prepare for Sleeve Gastrectomy Surgery.   Call the [facility] at [phone] once you have been given your surgery date to enrolled in the Pre-Op Nutrition Class. You will need to attend this nutrition class 3-4 weeks prior to your surgery.  

## 2013-06-03 NOTE — Progress Notes (Signed)
  Pre-Op Assessment Visit:  Pre-Operative Sleeve Gastrectomy Surgery  Medical Nutrition Therapy:  Appt start time: 1500   End time:  1600.  Patient was seen on 06/03/2013 for Pre-Operative Sleeve Gastrectomy Nutrition Assessment. Assessment and letter of approval faxed to [facility] Bariatric Surgery Program coordinator on 06/03/2013.  Approval letter sent to [facility] Scan center and will be available in the chart under the media tab.  Handouts given during visit include:  Pre-Op Goals   Bariatric Surgery Protein Shakes  Patient to call for Pre-Op and Post-Op Nutrition Education at the [facility] when surgery is scheduled.

## 2013-06-06 ENCOUNTER — Ambulatory Visit (HOSPITAL_COMMUNITY)
Admission: RE | Admit: 2013-06-06 | Discharge: 2013-06-06 | Disposition: A | Discharge status: Home / Self Care | Payer: 59 | Source: Ambulatory Visit | Attending: Surgery | Admitting: Surgery

## 2013-06-06 ENCOUNTER — Other Ambulatory Visit: Payer: Self-pay

## 2013-06-06 DIAGNOSIS — B350 Tinea barbae and tinea capitis: Secondary | ICD-10-CM | POA: Insufficient documentation

## 2013-06-06 DIAGNOSIS — G4733 Obstructive sleep apnea (adult) (pediatric): Secondary | ICD-10-CM | POA: Insufficient documentation

## 2013-06-06 DIAGNOSIS — D1803 Hemangioma of intra-abdominal structures: Secondary | ICD-10-CM | POA: Insufficient documentation

## 2013-06-06 DIAGNOSIS — Z6841 Body Mass Index (BMI) 40.0 and over, adult: Secondary | ICD-10-CM | POA: Insufficient documentation

## 2013-06-06 DIAGNOSIS — IMO0002 Reserved for concepts with insufficient information to code with codable children: Secondary | ICD-10-CM | POA: Insufficient documentation

## 2013-06-06 DIAGNOSIS — M51379 Other intervertebral disc degeneration, lumbosacral region without mention of lumbar back pain or lower extremity pain: Secondary | ICD-10-CM | POA: Insufficient documentation

## 2013-06-06 DIAGNOSIS — K769 Liver disease, unspecified: Secondary | ICD-10-CM | POA: Insufficient documentation

## 2013-06-06 DIAGNOSIS — M25569 Pain in unspecified knee: Secondary | ICD-10-CM | POA: Insufficient documentation

## 2013-06-06 DIAGNOSIS — M5137 Other intervertebral disc degeneration, lumbosacral region: Secondary | ICD-10-CM | POA: Insufficient documentation

## 2013-06-06 DIAGNOSIS — K7689 Other specified diseases of liver: Secondary | ICD-10-CM | POA: Insufficient documentation

## 2013-06-06 DIAGNOSIS — G4730 Sleep apnea, unspecified: Secondary | ICD-10-CM | POA: Insufficient documentation

## 2013-06-11 ENCOUNTER — Ambulatory Visit: Payer: 59 | Admitting: Pulmonary Disease

## 2013-06-11 ENCOUNTER — Telehealth: Payer: Self-pay | Admitting: Pulmonary Disease

## 2013-06-11 MED ORDER — TESTOSTERONE 20.25 MG/ACT (1.62%) TD GEL: 2.0000 | Freq: Every day | TRANSDERMAL | Status: DC

## 2013-06-11 NOTE — Telephone Encounter (Signed)
Called pt and verified the msg  Pt aware to keep upcoming appt with SN He verbalized understanding and nothing further needed Rx was refilled

## 2013-07-17 ENCOUNTER — Ambulatory Visit (INDEPENDENT_AMBULATORY_CARE_PROVIDER_SITE_OTHER): Payer: 59 | Admitting: Pulmonary Disease

## 2013-07-17 ENCOUNTER — Encounter: Payer: Self-pay | Admitting: Pulmonary Disease

## 2013-07-17 VITALS — BP 140/90 | HR 73 | Temp 98.4°F | Resp 18 | Ht 75.0 in | Wt 323.0 lb

## 2013-07-17 DIAGNOSIS — E663 Overweight: Secondary | ICD-10-CM

## 2013-07-17 DIAGNOSIS — E291 Testicular hypofunction: Secondary | ICD-10-CM | POA: Insufficient documentation

## 2013-07-17 DIAGNOSIS — M5137 Other intervertebral disc degeneration, lumbosacral region: Secondary | ICD-10-CM

## 2013-07-17 DIAGNOSIS — Z0000 Encounter for general adult medical examination without abnormal findings: Secondary | ICD-10-CM

## 2013-07-17 DIAGNOSIS — G4733 Obstructive sleep apnea (adult) (pediatric): Secondary | ICD-10-CM

## 2013-07-17 DIAGNOSIS — D1803 Hemangioma of intra-abdominal structures: Secondary | ICD-10-CM

## 2013-07-17 DIAGNOSIS — M25569 Pain in unspecified knee: Secondary | ICD-10-CM

## 2013-07-17 MED ORDER — TESTOSTERONE 20.25 MG/ACT (1.62%) TD GEL: 2.0000 | Freq: Every day | TRANSDERMAL | Status: DC
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
## 2013-07-17 NOTE — Patient Instructions (Addendum)
Today we updated your med list in our EPIC system...    Continue your current medications the same...    We refilled the meds you requested...  We reviewed your recent lab work, XRays, & EKG...  Good luck w/ the upcoming "sleeve" surgery...  Call for any questions or if we can be of service in any way...  Let's plan a follow up visit in 49yr, sooner if needed for problems.Marland KitchenMarland Kitchen

## 2013-08-20 ENCOUNTER — Ambulatory Visit (HOSPITAL_COMMUNITY)
Admission: RE | Admit: 2013-08-20 | Discharge: 2013-08-20 | Disposition: A | Discharge status: Home / Self Care | Payer: 59 | Source: Ambulatory Visit | Attending: Surgery | Admitting: Surgery

## 2013-08-20 ENCOUNTER — Encounter (HOSPITAL_COMMUNITY)
Admission: RE | Disposition: A | Discharge status: Home / Self Care | Payer: Self-pay | Source: Ambulatory Visit | Attending: Surgery

## 2013-08-20 DIAGNOSIS — Z01818 Encounter for other preprocedural examination: Secondary | ICD-10-CM | POA: Insufficient documentation

## 2013-08-20 HISTORY — PX: BREATH TEK H PYLORI: SHX5422

## 2013-08-20 LAB — CBC WITH DIFFERENTIAL/PLATELET
Basophils Absolute: 0 10*3/uL (ref 0.0–0.1)
Basophils Relative: 1 % (ref 0–1)
Eosinophils Absolute: 0.1 10*3/uL (ref 0.0–0.7)
Eosinophils Relative: 2 % (ref 0–5)
HCT: 44.9 % (ref 39.0–52.0)
Hemoglobin: 15 g/dL (ref 13.0–17.0)
Lymphocytes Relative: 48 % — ABNORMAL HIGH (ref 12–46)
Lymphs Abs: 2.1 10*3/uL (ref 0.7–4.0)
MCH: 27.5 pg (ref 26.0–34.0)
MCHC: 33.4 g/dL (ref 30.0–36.0)
MCV: 82.4 fL (ref 78.0–100.0)
Monocytes Absolute: 0.3 10*3/uL (ref 0.1–1.0)
Monocytes Relative: 8 % (ref 3–12)
Neutro Abs: 1.8 10*3/uL (ref 1.7–7.7)
Neutrophils Relative %: 41 % — ABNORMAL LOW (ref 43–77)
Platelets: 242 10*3/uL (ref 150–400)
RBC: 5.45 MIL/uL (ref 4.22–5.81)
RDW: 15.3 % (ref 11.5–15.5)
WBC: 4.3 10*3/uL (ref 4.0–10.5)

## 2013-08-20 LAB — T4: T4, Total: 6.9 ug/dL (ref 5.0–12.5)

## 2013-08-20 LAB — H. PYLORI ANTIBODY, IGG: H Pylori IgG: <0.4 {ISR}

## 2013-08-20 SURGERY — BREATH TEST, FOR HELICOBACTER PYLORI

## 2013-08-21 ENCOUNTER — Encounter (HOSPITAL_COMMUNITY): Payer: Self-pay | Admitting: Surgery

## 2013-09-16 ENCOUNTER — Telehealth: Payer: Self-pay | Admitting: Pulmonary Disease

## 2013-09-16 NOTE — Telephone Encounter (Signed)
lmomtcb x1 for pt 

## 2013-09-17 NOTE — Telephone Encounter (Signed)
Per SN---  They will do the type and cross prior to his surgery.    Or later donate blood to the red cross and find out type for free.    Wait till next regular lab draw.

## 2013-09-17 NOTE — Telephone Encounter (Signed)
ATC patient no answer LMOMTCB 

## 2013-09-17 NOTE — Telephone Encounter (Signed)
Pt is scheduled to have the sleeve next month Labs likely needed for this LMOM TCB x1 for pt  In the meantime, Dr Kriste Basque may pt have labs to determine blood type?  Thank you.

## 2013-09-18 NOTE — Telephone Encounter (Signed)
Spoke with the pt and notified of recs per SN Pt verbalized understanding

## 2013-09-23 NOTE — Progress Notes (Signed)
Need orders in EPIC.  Surgery scheduled for 10/14.  Preop 10/03/13 at 100pm.  Thank You.

## 2013-09-24 ENCOUNTER — Encounter (HOSPITAL_COMMUNITY): Payer: Self-pay | Admitting: Pharmacy Technician

## 2013-09-25 ENCOUNTER — Other Ambulatory Visit (INDEPENDENT_AMBULATORY_CARE_PROVIDER_SITE_OTHER): Payer: Self-pay | Admitting: Surgery

## 2013-09-25 ENCOUNTER — Ambulatory Visit (INDEPENDENT_AMBULATORY_CARE_PROVIDER_SITE_OTHER): Payer: 59 | Admitting: Surgery

## 2013-09-25 ENCOUNTER — Encounter (INDEPENDENT_AMBULATORY_CARE_PROVIDER_SITE_OTHER): Payer: Self-pay | Admitting: Surgery

## 2013-09-25 VITALS — BP 140/90 | HR 82 | Temp 97.8°F | Resp 16 | Ht 75.0 in | Wt 333.2 lb

## 2013-09-25 DIAGNOSIS — Z6841 Body Mass Index (BMI) 40.0 and over, adult: Secondary | ICD-10-CM

## 2013-09-25 DIAGNOSIS — E663 Overweight: Secondary | ICD-10-CM

## 2013-09-25 NOTE — Progress Notes (Signed)
Chief Complaint:  Morbid obesity with a BMI of 41, weight 333   History of Present Illness:  Nicholas Stevens is an 42 y.o. male who has been to our seminar and is adjusted in a sleeve gastrectomy. His wife floor had a lap band several years ago. After looking at his options he has chosen to have a  sleeve gastrectomy.  On preoperative evaluation he had a negative upper GI series. His ultrasound showed some hemangiomas in both right and left lobes which were noted back several years ago on MR scan were characteristic of hemangiomas.  I went over the procedure with him again in some detail. He shows an awareness and his and his research on this. He is ready to go ahead and proceed with upper scopic sleeve gastrectomy.  Past Medical History  Diagnosis Date  . OSA (obstructive sleep apnea)   . Overweight(278.02)   . Liver hemangioma   . Knee pain   . DDD (degenerative disc disease), lumbar   . Tinea barbae   . Morbid obesity     Past Surgical History  Procedure Laterality Date  . Rotator cuff repair  2005    Dr Duda  . Laparoscopic appendectomy  2007    Dr Toth  . Breath tek h pylori N/A 08/20/2013    Procedure: BREATH TEK H PYLORI;  Surgeon: Yailin Biederman B Annelie Boak, MD;  Location: [facility];  Service: General;  Laterality: N/A;    Current Outpatient Prescriptions  Medication Sig Dispense Refill  . Multiple Vitamin (MULTIVITAMIN WITH MINERALS) TABS tablet Take 1 tablet by mouth daily.      . Testosterone (ANDROGEL PUMP) 20.25 MG/ACT (1.62%) GEL Place 2 Squirts onto the skin daily.  75 g  5   No current facility-administered medications for this visit.   Review of patient's allergies indicates no known allergies. Family History  Problem Relation Age of Onset  . Liver disease Father     ETOH, Hep C  . Hypertension Father   . Other Mother     hx of DJD   Social History:   reports that he has never smoked. He has never used smokeless tobacco. He reports that he does not drink  alcohol or use illicit drugs.   REVIEW OF SYSTEMS - PERTINENT POSITIVES ONLY: Negative for DVT  Physical Exam:   Blood pressure 140/90, pulse 82, temperature 97.8 F (36.6 C), temperature source Oral, resp. rate 16, height 6' 3" (1.905 m), weight 333 lb 3.2 oz (151.139 kg). Body mass index is 41.65 kg/(m^2).  Gen:  WDWN male NAD  Neurological: Alert and oriented to person, place, and time. Motor and sensory function is grossly intact  Head: Normocephalic and atraumatic.  Eyes: Conjunctivae are normal. Pupils are equal, round, and reactive to light. No scleral icterus.  Neck: Normal range of motion. Neck supple. No tracheal deviation or thyromegaly present.  Cardiovascular:  SR without murmurs or gallops.  No carotid bruits Respiratory: Effort normal.  No respiratory distress. No chest wall tenderness. Breath sounds normal.  No wheezes, rales or rhonchi.  Abdomen:  nontender GU: Musculoskeletal: Normal range of motion. Extremities are nontender. No cyanosis, edema or clubbing noted Lymphadenopathy: No cervical, preauricular, postauricular or axillary adenopathy is present Skin: Skin is warm and dry. No rash noted. No diaphoresis. No erythema. No pallor. Pscyh: Normal mood and affect. Behavior is normal. Judgment and thought content normal.   LABORATORY RESULTS: No results found for this or any previous visit (from the past 48 hour(s)).    RADIOLOGY RESULTS: No results found.  Problem List: Patient Active Problem List   Diagnosis Date Noted  . Hypogonadism, male 07/17/2013  . Erectile dysfunction 03/22/2013  . Concentration deficit 04/27/2011  . TINEA BARBAE 03/04/2009  . LIVER HEMANGIOMA 03/04/2009  . OVERWEIGHT-BMI 42 03/04/2009  . OBSTRUCTIVE SLEEP APNEA 03/04/2009  . KNEE PAIN 03/04/2009  . DEGENERATIVE DISC DISEASE, LUMBAR SPINE 03/04/2009    Assessment & Plan: Morbid obesity for sleeve gastrectomy. Patient does proceed on the low carbohydrate diet  preoperatively.    Matt B. Anuhea Gassner, MD, FACS  [facility] [phone] beeper [phone]  09/25/2013 2:32 PM     

## 2013-09-25 NOTE — Patient Instructions (Signed)

## 2013-09-26 ENCOUNTER — Ambulatory Visit (INDEPENDENT_AMBULATORY_CARE_PROVIDER_SITE_OTHER): Payer: 59 | Admitting: Surgery

## 2013-10-03 ENCOUNTER — Encounter (HOSPITAL_COMMUNITY)
Admission: RE | Admit: 2013-10-03 | Discharge: 2013-10-03 | Disposition: A | Discharge status: Home / Self Care | Payer: 59 | Source: Ambulatory Visit | Attending: Surgery | Admitting: Surgery

## 2013-10-03 ENCOUNTER — Encounter (HOSPITAL_COMMUNITY): Payer: Self-pay

## 2013-10-03 DIAGNOSIS — Z01812 Encounter for preprocedural laboratory examination: Secondary | ICD-10-CM | POA: Insufficient documentation

## 2013-10-03 LAB — COMPREHENSIVE METABOLIC PANEL
ALT: 28 U/L (ref 0–53)
AST: 22 U/L (ref 0–37)
Albumin: 4.2 g/dL (ref 3.5–5.2)
CO2: 25 mEq/L (ref 19–32)
Chloride: 102 mEq/L (ref 96–112)
Creatinine, Ser: 0.8 mg/dL (ref 0.50–1.35)
GFR calc Af Amer: >90 mL/min (ref >90)
GFR calc non Af Amer: >90 mL/min (ref >90)
Potassium: 3.9 mEq/L (ref 3.5–5.1)
Sodium: 138 mEq/L (ref 135–145)
Total Bilirubin: 0.4 mg/dL (ref 0.3–1.2)

## 2013-10-03 LAB — CBC WITH DIFFERENTIAL/PLATELET
Basophils Absolute: 0 10*3/uL (ref 0.0–0.1)
Basophils Relative: 1 % (ref 0–1)
HCT: 44.2 % (ref 39.0–52.0)
Lymphocytes Relative: 42 % (ref 12–46)
Lymphs Abs: 1.8 10*3/uL (ref 0.7–4.0)
MCHC: 34.2 g/dL (ref 30.0–36.0)
Neutro Abs: 2.1 10*3/uL (ref 1.7–7.7)
Neutrophils Relative %: 47 % (ref 43–77)
Platelets: 220 10*3/uL (ref 150–400)
RBC: 5.46 MIL/uL (ref 4.22–5.81)
RDW: 14.5 % (ref 11.5–15.5)
WBC: 4.4 10*3/uL (ref 4.0–10.5)

## 2013-10-03 NOTE — Patient Instructions (Signed)
[address] JOSTIN RUE  10/03/2013   Your procedure is scheduled on: 10/08/13  Report to [facility] at 8:15AM.  Call this number if you have problems the morning of surgery 336-: [phone]   Remember:   Do not eat food or drink liquids After Midnight.   Do not wear jewelry, make-up or nail polish.  Do not wear lotions, powders, or perfumes. You may wear deodorant.  Do not shave 48 hours prior to surgery. Men may shave face and neck.  Do not bring valuables to the hospital.  Contacts, dentures or bridgework may not be worn into surgery.  Leave suitcase in the car. After surgery it may be brought to your room.  For patients admitted to the hospital, checkout time is 11:00 AM the day of discharge.    Please read over the following fact sheets that you were given: incentive spirometry fact sheet Birdie Sons, RN  pre op nurse call if needed [phone]    FAILURE TO FOLLOW THESE INSTRUCTIONS MAY RESULT IN CANCELLATION OF YOUR SURGERY   Patient Signature: ___________________________________________

## 2013-10-03 NOTE — Progress Notes (Signed)
Chest x-ray 04/30/13 on EPIC, EKG 06/06/13 on EPIC

## 2013-10-08 ENCOUNTER — Encounter (HOSPITAL_COMMUNITY)
Admission: RE | Disposition: A | Discharge status: Home / Self Care | Payer: Self-pay | Source: Ambulatory Visit | Attending: Surgery

## 2013-10-08 ENCOUNTER — Encounter (HOSPITAL_COMMUNITY): Payer: 59 | Admitting: Anesthesiology

## 2013-10-08 ENCOUNTER — Encounter (HOSPITAL_COMMUNITY): Payer: Self-pay | Admitting: *Deleted

## 2013-10-08 ENCOUNTER — Ambulatory Visit (HOSPITAL_COMMUNITY): Payer: 59 | Admitting: Anesthesiology

## 2013-10-08 ENCOUNTER — Inpatient Hospital Stay (HOSPITAL_COMMUNITY)
Admission: RE | Admit: 2013-10-08 | Discharge: 2013-10-10 | DRG: 621 | Disposition: A | Discharge status: Home / Self Care | Payer: 59 | Source: Ambulatory Visit | Attending: Surgery | Admitting: Surgery

## 2013-10-08 DIAGNOSIS — Z903 Acquired absence of stomach [part of]: Secondary | ICD-10-CM

## 2013-10-08 DIAGNOSIS — Z01812 Encounter for preprocedural laboratory examination: Secondary | ICD-10-CM

## 2013-10-08 DIAGNOSIS — M25569 Pain in unspecified knee: Secondary | ICD-10-CM

## 2013-10-08 DIAGNOSIS — D1803 Hemangioma of intra-abdominal structures: Secondary | ICD-10-CM | POA: Diagnosis present

## 2013-10-08 DIAGNOSIS — IMO0002 Reserved for concepts with insufficient information to code with codable children: Secondary | ICD-10-CM

## 2013-10-08 DIAGNOSIS — Z9884 Bariatric surgery status: Secondary | ICD-10-CM

## 2013-10-08 DIAGNOSIS — G4733 Obstructive sleep apnea (adult) (pediatric): Secondary | ICD-10-CM

## 2013-10-08 DIAGNOSIS — Z6841 Body Mass Index (BMI) 40.0 and over, adult: Secondary | ICD-10-CM

## 2013-10-08 HISTORY — PX: LAPAROSCOPIC GASTRIC SLEEVE RESECTION: SHX5895

## 2013-10-08 LAB — CBC
HCT: 44.1 % (ref 39.0–52.0)
MCH: 27.7 pg (ref 26.0–34.0)
MCV: 82 fL (ref 78.0–100.0)
RBC: 5.38 MIL/uL (ref 4.22–5.81)
RDW: 14.5 % (ref 11.5–15.5)
WBC: 10 10*3/uL (ref 4.0–10.5)

## 2013-10-08 LAB — CREATININE, SERUM
Creatinine, Ser: 0.83 mg/dL (ref 0.50–1.35)
GFR calc Af Amer: >90 mL/min (ref >90)
GFR calc non Af Amer: >90 mL/min (ref >90)

## 2013-10-08 SURGERY — GASTRECTOMY, SLEEVE, LAPAROSCOPIC
Anesthesia: General | Site: Abdomen | Wound class: Clean Contaminated

## 2013-10-08 MED ORDER — LACTATED RINGERS IV SOLN
INTRAVENOUS | Status: DC | PRN
  Administered 2013-10-08 (×3): via INTRAVENOUS

## 2013-10-08 MED ORDER — HYDROMORPHONE HCL PF 1 MG/ML IJ SOLN: 0.2500 mg | INTRAMUSCULAR | Status: DC | PRN

## 2013-10-08 MED ORDER — KCL IN DEXTROSE-NACL 20-5-0.45 MEQ/L-%-% IV SOLN
INTRAVENOUS | Status: DC
  Administered 2013-10-08 – 2013-10-10 (×3): via INTRAVENOUS
  Filled 2013-10-08 (×5): qty 1000

## 2013-10-08 MED ORDER — UNJURY CHOCOLATE CLASSIC POWDER: 2.0000 [oz_av] | Freq: Four times a day (QID) | ORAL | Status: DC

## 2013-10-08 MED ORDER — ACETAMINOPHEN 160 MG/5ML PO SOLN: 650.0000 mg | ORAL | Status: DC | PRN

## 2013-10-08 MED ORDER — CHLORHEXIDINE GLUCONATE 0.12 % MT SOLN
15.0000 mL | Freq: Two times a day (BID) | OROMUCOSAL | Status: DC
  Administered 2013-10-08 – 2013-10-09 (×3): 15 mL via OROMUCOSAL
  Filled 2013-10-08 (×6): qty 15

## 2013-10-08 MED ORDER — LIP MEDEX EX OINT
TOPICAL_OINTMENT | CUTANEOUS | Status: AC
  Administered 2013-10-08: 17:00:00
  Filled 2013-10-08: qty 7

## 2013-10-08 MED ORDER — PROPOFOL 10 MG/ML IV BOLUS
INTRAVENOUS | Status: DC | PRN
  Administered 2013-10-08: 140 mg via INTRAVENOUS
  Administered 2013-10-08: 200 mg via INTRAVENOUS

## 2013-10-08 MED ORDER — BIOTENE DRY MOUTH MT LIQD
15.0000 mL | Freq: Two times a day (BID) | OROMUCOSAL | Status: DC
  Administered 2013-10-08 – 2013-10-09 (×2): 15 mL via OROMUCOSAL

## 2013-10-08 MED ORDER — GLYCOPYRROLATE 0.2 MG/ML IJ SOLN
INTRAMUSCULAR | Status: DC | PRN
  Administered 2013-10-08: 0.6 mg via INTRAVENOUS

## 2013-10-08 MED ORDER — UNJURY CHICKEN SOUP POWDER: 2.0000 [oz_av] | Freq: Four times a day (QID) | ORAL | Status: DC

## 2013-10-08 MED ORDER — UNJURY VANILLA POWDER: 2.0000 [oz_av] | Freq: Four times a day (QID) | ORAL | Status: DC

## 2013-10-08 MED ORDER — SUCCINYLCHOLINE CHLORIDE 20 MG/ML IJ SOLN
INTRAMUSCULAR | Status: DC | PRN
  Administered 2013-10-08: 200 mg via INTRAVENOUS

## 2013-10-08 MED ORDER — NEOSTIGMINE METHYLSULFATE 1 MG/ML IJ SOLN
INTRAMUSCULAR | Status: DC | PRN
Start: 2013-10-08 — End: 2013-10-08
  Administered 2013-10-08: 5 mg via INTRAVENOUS

## 2013-10-08 MED ORDER — 0.9 % SODIUM CHLORIDE (POUR BTL) OPTIME
TOPICAL | Status: DC | PRN
  Administered 2013-10-08: 1000 mL

## 2013-10-08 MED ORDER — ONDANSETRON HCL 4 MG/2ML IJ SOLN
4.0000 mg | INTRAMUSCULAR | Status: DC | PRN
  Administered 2013-10-08: 4 mg via INTRAVENOUS
  Filled 2013-10-08: qty 2

## 2013-10-08 MED ORDER — HEPARIN SODIUM (PORCINE) 5000 UNIT/ML IJ SOLN
5000.0000 [IU] | Freq: Three times a day (TID) | INTRAMUSCULAR | Status: DC
  Administered 2013-10-08 – 2013-10-10 (×5): 5000 [IU] via SUBCUTANEOUS
  Filled 2013-10-08 (×8): qty 1

## 2013-10-08 MED ORDER — FENTANYL CITRATE 0.05 MG/ML IJ SOLN
INTRAMUSCULAR | Status: DC | PRN
  Administered 2013-10-08: 100 ug via INTRAVENOUS
  Administered 2013-10-08: 50 ug via INTRAVENOUS
  Administered 2013-10-08: 100 ug via INTRAVENOUS

## 2013-10-08 MED ORDER — LIDOCAINE HCL (CARDIAC) 20 MG/ML IV SOLN
INTRAVENOUS | Status: DC | PRN
  Administered 2013-10-08: 100 mg via INTRAVENOUS

## 2013-10-08 MED ORDER — TISSEEL VH 10 ML EX KIT
PACK | CUTANEOUS | Status: DC | PRN
  Administered 2013-10-08: 1

## 2013-10-08 MED ORDER — BUPIVACAINE LIPOSOME 1.3 % IJ SUSP
20.0000 mL | Freq: Once | INTRAMUSCULAR | Status: AC
  Administered 2013-10-08: 20 mL
  Filled 2013-10-08: qty 20

## 2013-10-08 MED ORDER — CEFOXITIN SODIUM 1 G IV SOLR
2.0000 g | INTRAVENOUS | Status: AC
  Administered 2013-10-08: 2 g via INTRAVENOUS
  Filled 2013-10-08: qty 2

## 2013-10-08 MED ORDER — DEXTROSE 5 % IV SOLN
INTRAVENOUS | Status: AC
  Filled 2013-10-08 (×2): qty 1

## 2013-10-08 MED ORDER — ROCURONIUM BROMIDE 100 MG/10ML IV SOLN
INTRAVENOUS | Status: DC | PRN
  Administered 2013-10-08: 50 mg via INTRAVENOUS
  Administered 2013-10-08: 10 mg via INTRAVENOUS
  Administered 2013-10-08: 30 mg via INTRAVENOUS
  Administered 2013-10-08: 20 mg via INTRAVENOUS

## 2013-10-08 MED ORDER — LACTATED RINGERS IR SOLN
Status: DC | PRN
  Administered 2013-10-08: 3000 mL

## 2013-10-08 MED ORDER — LACTATED RINGERS IV SOLN: INTRAVENOUS | Status: DC

## 2013-10-08 MED ORDER — ONDANSETRON HCL 4 MG/2ML IJ SOLN
INTRAMUSCULAR | Status: DC | PRN
  Administered 2013-10-08: 4 mg via INTRAMUSCULAR

## 2013-10-08 MED ORDER — HEPARIN SODIUM (PORCINE) 5000 UNIT/ML IJ SOLN
5000.0000 [IU] | INTRAMUSCULAR | Status: AC
  Administered 2013-10-08: 5000 [IU] via SUBCUTANEOUS
  Filled 2013-10-08: qty 1

## 2013-10-08 MED ORDER — TISSEEL VH 10 ML EX KIT
PACK | CUTANEOUS | Status: AC
  Filled 2013-10-08: qty 2

## 2013-10-08 MED ORDER — MIDAZOLAM HCL 5 MG/5ML IJ SOLN
INTRAMUSCULAR | Status: DC | PRN
  Administered 2013-10-08: 2 mg via INTRAVENOUS

## 2013-10-08 MED ORDER — MORPHINE SULFATE 2 MG/ML IJ SOLN
2.0000 mg | INTRAMUSCULAR | Status: DC | PRN
  Administered 2013-10-08 (×2): 4 mg via INTRAVENOUS
  Administered 2013-10-09 (×3): 6 mg via INTRAVENOUS
  Administered 2013-10-09: 4 mg via INTRAVENOUS
  Filled 2013-10-08 (×2): qty 2
  Filled 2013-10-08: qty 3
  Filled 2013-10-08: qty 2
  Filled 2013-10-08 (×2): qty 3

## 2013-10-08 MED ORDER — HYDROMORPHONE HCL PF 1 MG/ML IJ SOLN
INTRAMUSCULAR | Status: DC | PRN
  Administered 2013-10-08 (×2): 1 mg via INTRAVENOUS

## 2013-10-08 SURGICAL SUPPLY — 49 items
APPLICATOR COTTON TIP 6IN STRL (MISCELLANEOUS) ×2 IMPLANT
APPLIER CLIP ROT 10 11.4 M/L (STAPLE)
CABLE HIGH FREQUENCY MONO STRZ (ELECTRODE) ×2 IMPLANT
CANISTER SUCTION 2500CC (MISCELLANEOUS) ×4 IMPLANT
CLIP APPLIE ROT 10 11.4 M/L (STAPLE) IMPLANT
DERMABOND ADVANCED (GAUZE/BANDAGES/DRESSINGS) ×2
DERMABOND ADVANCED .7 DNX12 (GAUZE/BANDAGES/DRESSINGS) ×2 IMPLANT
DEVICE SUTURE ENDOST 10MM (ENDOMECHANICALS) IMPLANT
DEVICE TROCAR PUNCTURE CLOSURE (ENDOMECHANICALS) ×2 IMPLANT
DRAIN CHANNEL 19F RND (DRAIN) IMPLANT
DRAPE CAMERA CLOSED 9X96 (DRAPES) ×2 IMPLANT
ELECT REM PT RETURN 9FT ADLT (ELECTROSURGICAL) ×2
ELECTRODE REM PT RTRN 9FT ADLT (ELECTROSURGICAL) ×1 IMPLANT
EVACUATOR DRAINAGE 10X20 100CC (DRAIN) IMPLANT
EVACUATOR SILICONE 100CC (DRAIN)
GLOVE BIO SURGEON STRL SZ8 (GLOVE) ×2 IMPLANT
GOWN STRL REIN XL XLG (GOWN DISPOSABLE) ×8 IMPLANT
HOVERMATT SINGLE USE (MISCELLANEOUS) ×2 IMPLANT
KIT BASIN OR (CUSTOM PROCEDURE TRAY) ×2 IMPLANT
MARKER SKIN DUAL TIP RULER LAB (MISCELLANEOUS) ×2 IMPLANT
NEEDLE SPNL 22GX3.5 QUINCKE BK (NEEDLE) ×2 IMPLANT
NS IRRIG 1000ML POUR BTL (IV SOLUTION) ×2 IMPLANT
PACK UNIVERSAL I (CUSTOM PROCEDURE TRAY) ×2 IMPLANT
PENCIL BUTTON HOLSTER BLD 10FT (ELECTRODE) IMPLANT
POUCH SPECIMEN RETRIEVAL 10MM (ENDOMECHANICALS) IMPLANT
RELOAD BLUE (STAPLE) ×12 IMPLANT
RELOAD ENDO STITCH (ENDOMECHANICALS) IMPLANT
RELOAD GREEN (STAPLE) ×2 IMPLANT
SCISSORS LAP 5X45 EPIX DISP (ENDOMECHANICALS) ×2 IMPLANT
SEALANT SURGICAL APPL DUAL CAN (MISCELLANEOUS) IMPLANT
SET IRRIG TUBING LAPAROSCOPIC (IRRIGATION / IRRIGATOR) ×2 IMPLANT
SHEARS CURVED HARMONIC AC 45CM (MISCELLANEOUS) ×2 IMPLANT
SLEEVE XCEL OPT CAN 5 100 (ENDOMECHANICALS) ×8 IMPLANT
SOLUTION ANTI FOG 6CC (MISCELLANEOUS) ×2 IMPLANT
SPONGE GAUZE 4X4 12PLY (GAUZE/BANDAGES/DRESSINGS) IMPLANT
SPONGE LAP 18X18 X RAY DECT (DISPOSABLE) ×2 IMPLANT
STAPLE ECHEON FLEX 60 POW ENDO (STAPLE) ×2 IMPLANT
SUT ETHILON 2 0 PS N (SUTURE) IMPLANT
SUT VIC AB 0 UR5 27 (SUTURE) ×2 IMPLANT
SUT VIC AB 4-0 SH 18 (SUTURE) ×2 IMPLANT
SYR 50ML LL SCALE MARK (SYRINGE) ×2 IMPLANT
TRAY FOLEY CATH 14FRSI W/METER (CATHETERS) ×2 IMPLANT
TRAY LAP CHOLE (CUSTOM PROCEDURE TRAY) ×2 IMPLANT
TROCAR BLADELESS 15MM (ENDOMECHANICALS) ×2 IMPLANT
TROCAR XCEL 12X100 BLDLESS (ENDOMECHANICALS) ×2 IMPLANT
TROCAR XCEL NON-BLD 5MMX100MML (ENDOMECHANICALS) ×2 IMPLANT
TUBING CONNECTING 10 (TUBING) ×2 IMPLANT
TUBING ENDO SMARTCAP (MISCELLANEOUS) ×2 IMPLANT
TUBING FILTER THERMOFLATOR (ELECTROSURGICAL) ×2 IMPLANT

## 2013-10-08 NOTE — Anesthesia Preprocedure Evaluation (Addendum)
Anesthesia Evaluation  Patient identified by MRN, date of birth, ID band Patient awake    Reviewed: Allergy & Precautions, H&P , NPO status , Patient's Chart, lab work & pertinent test results  Airway Mallampati: III TM Distance: >3 FB Neck ROM: full    Dental no notable dental hx. (+) Teeth Intact and Dental Advisory Given   Pulmonary sleep apnea and Continuous Positive Airway Pressure Ventilation ,  breath sounds clear to auscultation  Pulmonary exam normal       Cardiovascular Exercise Tolerance: Good negative cardio ROS  Rhythm:regular Rate:Normal     Neuro/Psych negative neurological ROS  negative psych ROS   GI/Hepatic negative GI ROS, Neg liver ROS,   Endo/Other  negative endocrine ROSMorbid obesity  Renal/GU negative Renal ROS  negative genitourinary   Musculoskeletal   Abdominal (+) + obese,   Peds  Hematology negative hematology ROS (+)   Anesthesia Other Findings   Reproductive/Obstetrics negative OB ROS                         Anesthesia Physical Anesthesia Plan  ASA: III  Anesthesia Plan: General   Post-op Pain Management:    Induction: Intravenous  Airway Management Planned: Oral ETT  Additional Equipment:   Intra-op Plan:   Post-operative Plan: Extubation in OR  Informed Consent: I have reviewed the patients History and Physical, chart, labs and discussed the procedure including the risks, benefits and alternatives for the proposed anesthesia with the patient or authorized representative who has indicated his/her understanding and acceptance.   Dental Advisory Given  Plan Discussed with: CRNA and Surgeon  Anesthesia Plan Comments:         Anesthesia Quick Evaluation

## 2013-10-08 NOTE — Anesthesia Postprocedure Evaluation (Signed)
  Anesthesia Post-op Note  Patient: Nicholas Stevens  Procedure(s) Performed: Procedure(s) (LRB): LAPAROSCOPIC SLEEVE GASTRECTOMY (N/A)  Patient Location: PACU  Anesthesia Type: General  Level of Consciousness: awake and alert   Airway and Oxygen Therapy: Patient Spontanous Breathing  Post-op Pain: mild  Post-op Assessment: Post-op Vital signs reviewed, Patient's Cardiovascular Status Stable, Respiratory Function Stable, Patent Airway and No signs of Nausea or vomiting  Last Vitals:  Filed Vitals:   10/08/13 1500  BP: 149/89  Pulse: 97  Temp: 37.2 C  Resp: 18    Post-op Vital Signs: stable   Complications: No apparent anesthesia complications

## 2013-10-08 NOTE — Interval H&P Note (Signed)
History and Physical Interval Note:  10/08/2013 10:57 AM  Nicholas Stevens  has presented today for surgery, with the diagnosis of MORBID OBESITY  The various methods of treatment have been discussed with the patient and family. After consideration of risks, benefits and other options for treatment, the patient has consented to  Procedure(s): LAPAROSCOPIC SLEEVE GASTRECTOMY (N/A) as a surgical intervention .  The patient's history has been reviewed, patient examined, no change in status, stable for surgery.  I have reviewed the patient's chart and labs.  Questions were answered to the patient's satisfaction.     Shaelin Lalley B

## 2013-10-08 NOTE — Op Note (Signed)
Surgeon: Wenda Low, MD, FACS  Asst:  Jaclynn Guarneri, M.D. FACS  Anes:  Gen.  Procedure: Laparoscopic sleeve gastrectomy  Diagnosis: Morbid obesity and large scrotal hemangiomata the caudate lobe  Complications: none  EBL:   25  cc  Description of Procedure:  The patient was taken to or 1 and given general anesthesia. The abdomen was prepped with PCMX and draped sterilely. Access to the abdomen was achieved to the left upper quadrant using a 5 mm 0 Optiview technique without difficulty. Following insufflation the abdomen was evaluated and a total of 7 trochars were used including a 15 slightly to the left of midline 12 the rod midline the rest fives.  Nathanson retractor was inserted and these large left lateral segment elevated. To our surprise there was a very ugly-looking mass visible through the gastrohepatic window which seemed to be pedunculated and appeared to be coming off the caudate lobe. We reviewed his per previous MR scan which showed this to be probably a hemangioma although it has grown. He elected not to biopsy it. Dr. Donell Beers  came by and looked at it and we will repeat the MRI as an outpatient.  Next we measured about 5 cm from the pylorus and began opening along the greater curvature. This dissection was done with the harmonic scalpel and went all the way to the left crus. When this had been completed we began the procedure. The large mass and the caudate lobe was actually creating some angulation at the incisura. He fired into the along the greater curvature with a green load and then passed the 36 dilator into the pylorus. With the dilator in place we then began marching up the stomach staying about 1 bougie distance from the obvious margins and each time check for movement of the bougie to make sure we were not pinching it. I used an Energy manager with the green load as mentioned before and then multiple firings using blue load. Once detached it was completed Dr. Johna Sheriff  performed an upper endoscopy revealing a nice uniform sleeve without any evidence of hourglass or pinching. Staple lines did not appear to be bleeding. And there were no bubbles or evidence of leak on the inside the abdomen.  The port sites were injected with Exparel. Tisseel was applied to the staple line. The Satira Mccallum was removed and abdomen was deflated. Wounds were closed 4-0 Vicryl and Dermabond.  [name], MD, [facility], [place] [phone]

## 2013-10-08 NOTE — H&P (View-Only) (Signed)
Chief Complaint:  Morbid obesity with a BMI of 41, weight 333   History of Present Illness:  Nicholas Stevens is an 42 y.o. male who has been to our seminar and is adjusted in a sleeve gastrectomy. His wife floor had a lap band several years ago. After looking at his options he has chosen to have a  sleeve gastrectomy.  On preoperative evaluation he had a negative upper GI series. His ultrasound showed some hemangiomas in both right and left lobes which were noted back several years ago on MR scan were characteristic of hemangiomas.  I went over the procedure with him again in some detail. He shows an awareness and his and his research on this. He is ready to go ahead and proceed with upper scopic sleeve gastrectomy.  Past Medical History  Diagnosis Date  . OSA (obstructive sleep apnea)   . Overweight(278.02)   . Liver hemangioma   . Knee pain   . DDD (degenerative disc disease), lumbar   . Tinea barbae   . Morbid obesity     Past Surgical History  Procedure Laterality Date  . Rotator cuff repair  2005    Dr Lajoyce Corners  . Laparoscopic appendectomy  2007    Dr Carolynne Edouard  . Breath tek h pylori N/A 08/20/2013    Procedure: BREATH TEK H PYLORI;  Surgeon: Valarie Merino, MD;  Location: [facility];  Service: General;  Laterality: N/A;    Current Outpatient Prescriptions  Medication Sig Dispense Refill  . Multiple Vitamin (MULTIVITAMIN WITH MINERALS) TABS tablet Take 1 tablet by mouth daily.      . Testosterone (ANDROGEL PUMP) 20.25 MG/ACT (1.62%) GEL Place 2 Squirts onto the skin daily.  75 g  5   No current facility-administered medications for this visit.   Review of patient's allergies indicates no known allergies. Family History  Problem Relation Age of Onset  . Liver disease Father     ETOH, Hep C  . Hypertension Father   . Other Mother     hx of DJD   Social History:   reports that he has never smoked. He has never used smokeless tobacco. He reports that he does not drink  alcohol or use illicit drugs.   REVIEW OF SYSTEMS - PERTINENT POSITIVES ONLY: Negative for DVT  Physical Exam:   Blood pressure 140/90, pulse 82, temperature 97.8 F (36.6 C), temperature source Oral, resp. rate 16, height 6\' 3"  (1.905 m), weight 333 lb 3.2 oz (151.139 kg). Body mass index is 41.65 kg/(m^2).  Gen:  WDWN male NAD  Neurological: Alert and oriented to person, place, and time. Motor and sensory function is grossly intact  Head: Normocephalic and atraumatic.  Eyes: Conjunctivae are normal. Pupils are equal, round, and reactive to light. No scleral icterus.  Neck: Normal range of motion. Neck supple. No tracheal deviation or thyromegaly present.  Cardiovascular:  SR without murmurs or gallops.  No carotid bruits Respiratory: Effort normal.  No respiratory distress. No chest wall tenderness. Breath sounds normal.  No wheezes, rales or rhonchi.  Abdomen:  nontender GU: Musculoskeletal: Normal range of motion. Extremities are nontender. No cyanosis, edema or clubbing noted Lymphadenopathy: No cervical, preauricular, postauricular or axillary adenopathy is present Skin: Skin is warm and dry. No rash noted. No diaphoresis. No erythema. No pallor. Pscyh: Normal mood and affect. Behavior is normal. Judgment and thought content normal.   LABORATORY RESULTS: No results found for this or any previous visit (from the past 48 hour(s)).  RADIOLOGY RESULTS: No results found.  Problem List: Patient Active Problem List   Diagnosis Date Noted  . Hypogonadism, male 07/17/2013  . Erectile dysfunction 03/22/2013  . Concentration deficit 04/27/2011  . TINEA BARBAE 03/04/2009  . LIVER HEMANGIOMA 03/04/2009  . OVERWEIGHT-BMI 42 03/04/2009  . OBSTRUCTIVE SLEEP APNEA 03/04/2009  . KNEE PAIN 03/04/2009  . DEGENERATIVE DISC DISEASE, LUMBAR SPINE 03/04/2009    Assessment & Plan: Morbid obesity for sleeve gastrectomy. Patient does proceed on the low carbohydrate diet  preoperatively.    [name], MD, [facility] [phone] beeper [phone]  09/25/2013 2:32 PM

## 2013-10-08 NOTE — Transfer of Care (Signed)
Immediate Anesthesia Transfer of Care Note  Patient: Nicholas Stevens  Procedure(s) Performed: Procedure(s): LAPAROSCOPIC SLEEVE GASTRECTOMY (N/A)  Patient Location: PACU  Anesthesia Type:General  Level of Consciousness: awake and alert   Airway & Oxygen Therapy: Patient Spontanous Breathing and Patient connected to face mask oxygen  Post-op Assessment: Report given to PACU RN and Post -op Vital signs reviewed and stable  Post vital signs: Reviewed and stable  Complications: No apparent anesthesia complications

## 2013-10-08 NOTE — Anesthesia Procedure Notes (Signed)
Procedure Name: Intubation Date/Time: 10/08/2013 11:29 AM Performed by: [place], Nhi Butrum C Pre-anesthesia Checklist: Timeout performed, Patient identified, Emergency Drugs available, Suction available and Patient being monitored Patient Re-evaluated:Patient Re-evaluated prior to inductionOxygen Delivery Method: Circle system utilized Preoxygenation: Pre-oxygenation with 100% oxygen Intubation Type: Combination inhalational/ intravenous induction Ventilation: Mask ventilation without difficulty and Oral airway inserted - appropriate to patient size Laryngoscope Size: Mac and 4 Tube type: Oral Tube size: 7.5 mm Number of attempts: 3 Airway Equipment and Method: Video-laryngoscopy and Rigid stylet Placement Confirmation: ETT inserted through vocal cords under direct vision,  breath sounds checked- equal and bilateral,  positive ETCO2 and CO2 detector Secured at: 22 cm Tube secured with: Tape Dental Injury: Teeth and Oropharynx as per pre-operative assessment  Difficulty Due To: Difficult Airway- due to large tongue Future Recommendations: Recommend- induction with short-acting agent, and alternative techniques readily available Comments: DL x 1 by CRNA, epiglottis only seen.  DL x 1 by Dr. Leta Jungling MAC 4, unable to visualize VC. DL with Mac 4 Glidescope.  Cords visualized, 7,5 Ett placed with rigid stylet in place.

## 2013-10-09 ENCOUNTER — Encounter (HOSPITAL_COMMUNITY): Payer: Self-pay | Admitting: Surgery

## 2013-10-09 ENCOUNTER — Inpatient Hospital Stay (HOSPITAL_COMMUNITY): Payer: 59

## 2013-10-09 LAB — CBC WITH DIFFERENTIAL/PLATELET
Basophils Absolute: 0 10*3/uL (ref 0.0–0.1)
Basophils Relative: 0 % (ref 0–1)
Eosinophils Absolute: 0 10*3/uL (ref 0.0–0.7)
HCT: 42.3 % (ref 39.0–52.0)
Hemoglobin: 14.4 g/dL (ref 13.0–17.0)
Lymphocytes Relative: 16 % (ref 12–46)
MCH: 27.7 pg (ref 26.0–34.0)
MCHC: 34 g/dL (ref 30.0–36.0)
MCV: 81.3 fL (ref 78.0–100.0)
Monocytes Absolute: 0.6 10*3/uL (ref 0.1–1.0)
Neutro Abs: 6.5 10*3/uL (ref 1.7–7.7)
Neutrophils Relative %: 77 % (ref 43–77)
RDW: 14.6 % (ref 11.5–15.5)
WBC: 8.4 10*3/uL (ref 4.0–10.5)

## 2013-10-09 LAB — HEMOGLOBIN AND HEMATOCRIT, BLOOD: HCT: 43 % (ref 39.0–52.0)

## 2013-10-09 MED ORDER — HYDROCODONE-ACETAMINOPHEN 7.5-325 MG/15ML PO SOLN
10.0000 mL | ORAL | Status: DC | PRN
  Administered 2013-10-09 – 2013-10-10 (×4): 10 mL via ORAL
  Administered 2013-10-10: 13:00:00 via ORAL
  Filled 2013-10-09 (×5): qty 15

## 2013-10-09 MED ORDER — HYDROCODONE-ACETAMINOPHEN 5-325 MG PO TABS: 1.0000 | ORAL_TABLET | ORAL | Status: DC | PRN

## 2013-10-09 NOTE — Care Management Note (Signed)
    Page 1 of 1   10/09/2013     11:04:30 AM   CARE MANAGEMENT NOTE 10/09/2013  Patient:  Nicholas Stevens, Nicholas Stevens   Account Number:  [id number]  Date Initiated:  10/09/2013  Documentation initiated by:  Lorenda Ishihara  Subjective/Objective Assessment:   41 yo male admitted s/p lap gastric sleeve. PTA lived at home with spouse.     Action/Plan:   Home when stable   Anticipated DC Date:  10/10/2013   Anticipated DC Plan:  HOME/SELF CARE      DC Planning Services  CM consult      Choice offered to / List presented to:             Status of service:  Completed, signed off Medicare Important Message given?   (If response is "NO", the following Medicare IM given date fields will be blank) Date Medicare IM given:   Date Additional Medicare IM given:    Discharge Disposition:  HOME/SELF CARE  Per UR Regulation:  Reviewed for med. necessity/level of care/duration of stay  If discussed at Long Length of Stay Meetings, dates discussed:    Comments:

## 2013-10-09 NOTE — Progress Notes (Signed)
Verbal order from [facility] for norco tabs 5/325 as needed every 4 hours for pain Stanford Breed RN 10-09-2013 12;07pm

## 2013-10-09 NOTE — Progress Notes (Signed)
Patient ID: Nicholas Stevens, male   DOB: [birth year], 42 y.o.   MRN: [id number] [facility] Progress Note:   1 Day Post-Op  Subjective: Mental status is clear.   Objective: Vital signs in last 24 hours: Temp:  [98.1 F (36.7 C)-98.9 F (37.2 C)] 98.7 F (37.1 C) (10/15 1000) Pulse Rate:  [90-116] 90 (10/15 1000) Resp:  [14-21] 17 (10/15 1000) BP: (133-180)/(72-108) 160/99 mmHg (10/15 1000) SpO2:  [96 %-100 %] 98 % (10/15 1000) Weight:  [329 lb 8.3 oz (149.469 kg)] 329 lb 8.3 oz (149.469 kg) (10/14 1548)  Intake/Output from previous day: 10/14 0701 - 10/15 0700 In: 4046.7 [I.V.:4046.7] Out: 1875 [Urine:1875] Intake/Output this shift: Total I/O In: -  Out: 600 [Urine:600]  Physical Exam: Work of breathing is normal.  Minimal soreness  Lab Results:  Results for orders placed during the hospital encounter of 10/08/13 (from the past 48 hour(s))  CBC     Status: None   Collection Time    10/08/13  4:50 PM      Result Value Range   WBC 10.0  4.0 - 10.5 K/uL   RBC 5.38  4.22 - 5.81 MIL/uL   Hemoglobin 14.9  13.0 - 17.0 g/dL   HCT 78.2  95.6 - 21.3 %   MCV 82.0  78.0 - 100.0 fL   MCH 27.7  26.0 - 34.0 pg   MCHC 33.8  30.0 - 36.0 g/dL   RDW 08.6  57.8 - 46.9 %   Platelets 241  150 - 400 K/uL  CREATININE, SERUM     Status: None   Collection Time    10/08/13  4:50 PM      Result Value Range   Creatinine, Ser 0.83  0.50 - 1.35 mg/dL   GFR calc non Af Amer >90  >90 mL/min   GFR calc Af Amer >90  >90 mL/min   Comment: (NOTE)     The eGFR has been calculated using the CKD EPI equation.     This calculation has not been validated in all clinical situations.     eGFR's persistently <90 mL/min signify possible Chronic Kidney     Disease.  CBC WITH DIFFERENTIAL     Status: None   Collection Time    10/09/13  4:55 AM      Result Value Range   WBC 8.4  4.0 - 10.5 K/uL   RBC 5.20  4.22 - 5.81 MIL/uL   Hemoglobin 14.4  13.0 - 17.0 g/dL   HCT 62.9  52.8 - 41.3 %   MCV 81.3  78.0 - 100.0 fL   MCH 27.7  26.0 - 34.0 pg   MCHC 34.0  30.0 - 36.0 g/dL   RDW 24.4  01.0 - 27.2 %   Platelets 191  150 - 400 K/uL   Neutrophils Relative % 77  43 - 77 %   Neutro Abs 6.5  1.7 - 7.7 K/uL   Lymphocytes Relative 16  12 - 46 %   Lymphs Abs 1.3  0.7 - 4.0 K/uL   Monocytes Relative 7  3 - 12 %   Monocytes Absolute 0.6  0.1 - 1.0 K/uL   Eosinophils Relative 0  0 - 5 %   Eosinophils Absolute 0.0  0.0 - 0.7 K/uL   Basophils Relative 0  0 - 1 %   Basophils Absolute 0.0  0.0 - 0.1 K/uL    Radiology/Results: Dg Ugi W/water Sol Cm  10/09/2013   CLINICAL DATA:  Status post gastric sleeve procedure with gastrectomy  EXAM: WATER SOLUBLE UPPER GI SERIES  TECHNIQUE: Single-column upper GI series was performed using water soluble contrast.  CONTRAST:  50 cc water-soluble contrast material.  COMPARISON:  06/06/2013  FINDINGS: Fluoro time: 0 min 35 seconds.  Pre-procedure KUB shows mild diffuse gaseous small bowel distention, suggesting a component of underlying ileus.  Water-soluble contrast material was administered PO. Contrast material passes readily into the gastric sleeve. There is no evidence for contrast extravasation from the sleeve to suggest gastric leak. There is prompt emptying through the pylorus into the duodenum C loop. Duodenal motility is sluggish, not unexpected on postoperative day 1.  IMPRESSION: No evidence for contrast extravasation from the stomach. No gastric distention and no evidence for delayed gastric emptying.   Electronically Signed   By: Kennith Center M.D.   On: 10/09/2013 10:58    Anti-infectives: Anti-infectives   Start     Dose/Rate Route Frequency Ordered Stop   10/08/13 0820  cefOXitin (MEFOXIN) 2 g in dextrose 5 % 50 mL IVPB     2 g 100 mL/hr over 30 Minutes Intravenous On call to O.R. 10/08/13 0820 10/08/13 1139      Assessment/Plan: Problem List: Patient Active Problem List   Diagnosis Date Noted  . Laparoscopic sleeve gastrectomy Oct  2014 10/08/2013  . Hypogonadism, male 07/17/2013  . Erectile dysfunction 03/22/2013  . Concentration deficit 04/27/2011  . TINEA BARBAE 03/04/2009  . LIVER HEMANGIOMA 03/04/2009  . OVERWEIGHT-BMI 42 03/04/2009  . OBSTRUCTIVE SLEEP APNEA 03/04/2009  . KNEE PAIN 03/04/2009  . DEGENERATIVE DISC DISEASE, LUMBAR SPINE 03/04/2009    Swallow OK.  Begin PD 1 diet.  1 Day Post-Op    LOS: 1 day   [name], MD, [facility] [phone] beeper [phone]  10/09/2013 11:22 AM

## 2013-10-10 LAB — CBC WITH DIFFERENTIAL/PLATELET
Basophils Absolute: 0 10*3/uL (ref 0.0–0.1)
Hemoglobin: 14.7 g/dL (ref 13.0–17.0)
Lymphocytes Relative: 20 % (ref 12–46)
Lymphs Abs: 1.3 10*3/uL (ref 0.7–4.0)
Neutro Abs: 4.3 10*3/uL (ref 1.7–7.7)
Neutrophils Relative %: 70 % (ref 43–77)
Platelets: 153 10*3/uL (ref 150–400)
RBC: 5.25 MIL/uL (ref 4.22–5.81)
RDW: 14.6 % (ref 11.5–15.5)
WBC: 6.1 10*3/uL (ref 4.0–10.5)

## 2013-10-10 MED ORDER — HYDROCODONE-ACETAMINOPHEN 7.5-325 MG/15ML PO SOLN: 10.0000 mL | ORAL | Status: DC | PRN

## 2013-10-10 NOTE — Discharge Summary (Signed)
Physician Discharge Summary  Patient ID: Nicholas Stevens MRN: [id number] DOB/AGE: 12/27/1970 42 y.o.  Admit date: 10/08/2013 Discharge date: 10/10/2013  Admission Diagnoses:  Morbid obesity  Discharge Diagnoses:  Same with increasing "hemangiomata of the liver"  Active Problems:   Laparoscopic sleeve gastrectomy Oct 2014   Surgery:  Laparoscopic sleeve gastrectomy  Discharged Condition: improved  Hospital Course:   Had surgery.  Found large mass in caudate lobe felt to be prior hemangiomata from MRI in 2007.  Evaluated by Dr. Donell Beers in the OR.  Will work up as outpatient.  UGI was good.  Took liquids and ready for discharge  Consults: none  Significant Diagnostic Studies: UGI-no leaks    Discharge Exam: Blood pressure 152/94, pulse 94, temperature 98.1 F (36.7 C), temperature source Oral, resp. rate 18, height 6\' 3"  (1.905 m), weight 329 lb 8.3 oz (149.469 kg), SpO2 94.00%. Incisions look good-minimal pain  Disposition: 01-Home or Self Care  Discharge Orders   Future Appointments Provider Department Dept Phone   10/22/2013 3:30 PM [facility] Post-Op Class [facility] [phone]   10/25/2013 11:40 AM Valarie Merino, MD [facility], [place] [phone]   Future Orders Complete By Expires   Call MD for:  persistant nausea and vomiting  As directed    Call MD for:  severe uncontrolled pain  As directed    Call MD for:  temperature >100.4  As directed    Diet Carb Modified  As directed    Discharge instructions  As directed    Comments:     Shower and be up and active   Increase activity slowly  As directed    No wound care  As directed        Medication List         HYDROcodone-acetaminophen 7.5-325 mg/15 ml solution  Commonly known as:  HYCET  Take 10 mLs by mouth every 3 (three) hours as needed.     multivitamin with minerals Tabs tablet  Take 1 tablet by mouth daily.     Testosterone 20.25 MG/ACT  (1.62%) Gel  Commonly known as:  ANDROGEL PUMP  Place 2 Squirts onto the skin daily.           Follow-up Information   Follow up with Valarie Merino, MD.   Specialty:  General Surgery   Contact information:   [address] [place] [address] [phone]       Signed: Valarie Merino 10/10/2013, 8:21 AM

## 2013-10-10 NOTE — Progress Notes (Signed)
Patient alert and oriented, pain is controlled. Patient is tolerating fluids, plan to advance to protein shake today.  Reviewed Gastric sleeve discharge instructions with patient and patient is able to articulate understanding. Provided flyers for Support Group's, Outpatient Pharmacy, and BELT program.    GASTRIC BYPASS / SLEEVE  Home Care Instructions  These instructions are to help you care for yourself when you go home.  Call: If you have any problems.   Call [phone] and ask for the surgeon on call   If you need immediate assistance come to the ER at [facility]. Tell the ER staff that you are a new post-op gastric bypass or gastric sleeve patient   Signs and symptoms to report:   Severe vomiting or nausea o If you cannot handle clear liquids for longer than 1 day, call your surgeon    Abdominal pain which does not get better after taking your pain medication   Fever greater than 100.4 F and chills   Heart rate over 100 beats a minute   Trouble breathing   Chest pain    Redness, swelling, drainage, or foul odor at incision (surgical) sites    If your incisions open or pull apart   Swelling or pain in calf (lower leg)   Diarrhea (Loose bowel movements that happen often), frequent watery, uncontrolled bowel movements   Constipation, (no bowel movements for 3 days) if this happens:  o Take Milk of Magnesia, 2 tablespoons by mouth, 3 times a day for 2 days if needed o Stop taking Milk of Magnesia once you have had a bowel movement o Call your doctor if constipation continues Or o Take Miralax  (instead of Milk of Magnesia) following the label instructions o Stop taking Miralax once you have had a bowel movement o Call your doctor if constipation continues   Anything you think is "abnormal for you"   Normal side effects after surgery:   Unable to sleep at night or unable to concentrate   Irritability   Being tearful (crying) or depressed These are common complaints, possibly  related to your anesthesia, stress of surgery and change in lifestyle, that usually go away a few weeks after surgery.  If these feelings continue, call your medical doctor.  Wound Care: You may have surgical glue, steri-strips, or staples over your incisions after surgery   Surgical glue:  Looks like a clear film over your incisions and will wear off a little at a time   Steri-strips : Adhesive strips of tape over your incisions. You may notice a yellowish color on the skin under the steri-strips. This is used to make the   steri-strips stick better. Do not pull the steri-strips off - let them fall off   Staples: Staples may be removed before you leave the hospital o If you go home with staples, call [facility] at for an appointment with your surgeon's nurse to have staples removed 10 days after surgery, [phone]   Showering: You may shower two (2) days after your surgery unless your surgeon tells you differently o Wash gently around incisions with warm soapy water, rinse well, and gently pat dry  o If you have a drain (tube from your incision), you may need someone to hold this while you shower  o No tub baths until staples are removed and incisions are healed     Medications:   Medications should be liquid or crushed if larger than the size of a dime  Extended release pills (medication that releases a little bit at a time through the day) should not be crushed   Depending on the size and number of medications you take, you may need to space (take a few throughout the day)/change the time you take your medications so that you do not over-fill your pouch (smaller stomach)   Make sure you follow-up with your primary care physician to make medication changes needed during rapid weight loss and life-style changes   If you have diabetes, follow up with the doctor that orders your diabetes medication(s) within one week after surgery and check your blood sugar regularly.   Do not  drive while taking narcotics (pain medications)   Do not take acetaminophen (Tylenol) and Roxicet or Lortab Elixir at the same time since these pain medications contain acetaminophen  Diet:                    First 2 Weeks  You will see the nutritionist about two (2) weeks after your surgery. The nutritionist will increase the types of foods you can eat if you are handling liquids well:   If you have severe vomiting or nausea and cannot handle clear liquids lasting longer than 1 day, call your surgeon  Protein Shake   Drink at least 2 ounces of shake 5-6 times per day   Each serving of protein shakes (usually 8 - 12 ounces) should have a minimum of:  o 15 grams of protein  o And no more than 5 grams of carbohydrate    Goal for protein each day: o Men = 80 grams per day o Women = 60 grams per day   Protein powder may be added to fluids such as non-fat milk or Lactaid milk or Soy milk (limit to 35 grams added protein powder per serving)  Hydration   Slowly increase the amount of water and other clear liquids as tolerated (See Acceptable Fluids)   Slowly increase the amount of protein shake as tolerated     Sip fluids slowly and throughout the day   May use sugar substitutes in small amounts (no more than 6 - 8 packets per day; i.e. Splenda)  Fluid Goal   The first goal is to drink at least 8 ounces of protein shake/drink per day (or as directed by the nutritionist); some examples of protein shakes are [employer], Dillard's, EAS Edge HP, and Unjury. See handout from pre-op Bariatric Education Class: o Slowly increase the amount of protein shake you drink as tolerated o You may find it easier to slowly sip shakes throughout the day o It is important to get your proteins in first   Your fluid goal is to drink 64 - 100 ounces of fluid daily o It may take a few weeks to build up to this   32 oz (or more) should be clear liquids  And    32 oz (or more) should be full liquids (see  below for examples)   Liquids should not contain sugar, caffeine, or carbonation  Clear Liquids:   Water or Sugar-free flavored water (i.e. Fruit H2O, Propel)   Decaffeinated coffee or tea (sugar-free)   Crystal Lite, Wyler's Lite, Minute Maid Lite   Sugar-free Jell-O   Bouillon or broth   Sugar-free Popsicle:   *Less than 20 calories each; Limit 1 per day  Full Liquids: Protein Shakes/Drinks + 2 choices per day of other full liquids   Full liquids must be: o  No More Than 12 grams of Carbs per serving  o No More Than 3 grams of Fat per serving   Strained low-fat cream soup   Non-Fat milk   Fat-free Lactaid Milk   Sugar-free yogurt (Dannon Lite & Fit, Greek yogurt)      Vitamins and Minerals   Start 1 day after surgery unless otherwise directed by your surgeon   2 Chewable Multivitamin / Multimineral Supplement with iron (i.e. Centrum for Adults)   Vitamin B-12, 350 - 500 micrograms sub-lingual (place tablet under the tongue) each day   Chewable Calcium Citrate with Vitamin D-3 (Example: 3 Chewable Calcium Plus 600 with Vitamin D-3) o Take 500 mg three (3) times a day for a total of 1500 mg each day o Do not take all 3 doses of calcium at one time as it may cause constipation, and you can only absorb 500 mg  at a time  o Do not mix multivitamins containing iron with calcium supplements; take 2 hours apart o Do not substitute Tums (calcium carbonate) for your calcium   Menstruating women and those at risk for anemia (a blood disease that causes weakness) may need extra iron o Talk with your doctor to see if you need more iron   If you need extra iron: Total daily Iron recommendation (including Vitamins) is 50 to 100 mg Iron/day   Do not stop taking or change any vitamins or minerals until you talk to your nutritionist or surgeon   Your nutritionist and/or surgeon must approve all vitamin and mineral supplements   Activity and Exercise: It is important to continue walking at home.   Limit your physical activity as instructed by your doctor.  During this time, use these guidelines:   Do not lift anything greater than ten (10) pounds for at least two (2) weeks   Do not go back to work or drive until Designer, industrial/product says you can   You may have sex when you feel comfortable  o It is VERY important for male patients to use a reliable birth control method; fertility often increases after surgery  o Do not get pregnant for at least 18 months   Start exercising as soon as your doctor tells you that you can o Make sure your doctor approves any physical activity   Start with a simple walking program   Walk 5-15 minutes each day, 7 days per week.    Slowly increase until you are walking 30-45 minutes per day Consider joining our BELT program. [phone] or email [email or web address]   Special Instructions Things to remember:   Free counseling is available for you and your family through collaboration between [facility] and [place]. Please call [phone] and leave a message   Use your CPAP when sleeping if this applies to you   [facility] has a free Bariatric Surgery Support Group that meets monthly, the 3rd Thursday, 6 pm, [facility] Classrooms You can see classes online at [email or web address]   It is very important to keep all follow up appointments with your surgeon, nutritionist, primary care physician, and behavioral health practitioner o After the first year, please follow up with your bariatric surgeon and nutritionist at least once a year in order to maintain best weight loss results [facility]: [phone] [facility]: [phone] Bariatric Nurse Coordinator: [phone]

## 2013-10-25 ENCOUNTER — Ambulatory Visit (INDEPENDENT_AMBULATORY_CARE_PROVIDER_SITE_OTHER): Payer: 59 | Admitting: Surgery

## 2013-10-25 ENCOUNTER — Telehealth (INDEPENDENT_AMBULATORY_CARE_PROVIDER_SITE_OTHER): Payer: Self-pay | Admitting: *Deleted

## 2013-10-25 ENCOUNTER — Encounter (INDEPENDENT_AMBULATORY_CARE_PROVIDER_SITE_OTHER): Payer: Self-pay | Admitting: Surgery

## 2013-10-25 VITALS — BP 138/96 | HR 62 | Temp 98.1°F | Resp 16 | Ht 75.0 in | Wt 308.4 lb

## 2013-10-25 DIAGNOSIS — D1803 Hemangioma of intra-abdominal structures: Secondary | ICD-10-CM

## 2013-10-25 NOTE — Patient Instructions (Signed)
Sleeve Gastrectomy Care After Refer to this sheet in the next few weeks. These discharge instructions provide you with general information on caring for yourself after you leave the hospital. Your caregiver may also give you specific instructions. While your treatment has been planned according to the most current medical practices available, unavoidable complications sometimes occur. If you have any problems or questions after discharge, please call your caregiver. HOME CARE INSTRUCTIONS Activity  Take frequent rest periods throughout the day.  Take frequent walks throughout the day. This will help prevent blood clots.  Continue to do your coughing and deep breathing exercises once you get home. This will help prevent pneumonia.  No strenuous activities such as heavy lifting, pushing or pulling until after your follow-up visit with your caregiver. Do not lift anything heavier than 10 pounds (4.5 Kilograms).  Talk with your caregiver about when you may return to work and about your exercise routine.  Do not drive while taking prescription pain medicine. Nutrition It is very important that you drink in between meals. When you get home, you should drink one ounce of fluid as often as you can. Drink sugar free, clear liquid items after the surgery. You should stay on a full liquid diet until your follow-up doctor's visit. Items that you can drink include:  Fruit juices: apple, grape, cranberry (no juice cocktails - ONLY 100% juices).  Broths.  Others: Sugar-free Jell-O, sugar-free Popsicles, sugar-free frozen juice bars, water.  Decaffeinated coffee or tea  Nonfat or skim milk, protein shakes, sugar-free protein powder mix  You may need to take:  A multivitamin.  B-12.  A calcium supplement.  A dietician may also give you specific instructions. Hygiene  You may shower and wash your hair 1 day after surgery.  Pat incisions dry. Do not rub incisions with washcloth or  towel. Pain control  You may feel some chest, shoulder, or abdominal discomfort.  Assuming a knee-to-chest position may help.  Only take over-the-counter or prescription medicines for pain, discomfort, or fever as directed by your caregiver.  If the pain is not relieved by your medicine, you have difficulty breathing, or have severe calf pain, call your caregiver. Incision care  If there is no dressing, the incision should be open to air and kept dry.  You may have a dressing. Keep the dressing clean and dry. Remove the dressing according to your caregiver's instructions.  Your caregiver may use a drain to help you heal. If you have a drain:  Keep the area of the drain clean and dry.  Follow the instructions your healthcare team gave you at discharge for emptying the drain and recording the amount of fluid daily.  If the drain is accidentally pulled out, call your surgeon.  Remember to keep the drain supported by attaching it to your clothes by a safety pin to prevent premature accidental removal.  The normal drainage will be a light red color. If you notice the drainage to have a foul smell, turn green, or have bright red blood, call your surgeon. SEEK MEDICAL CARE IF:  If you feel feverish or have shaking chills, take your temperature. If your temperature is 101 F (38.3 C) or above, call your caregiver. The fever may mean there is an infection.  Check your incisions and surrounding area daily for any redness, swelling, discoloration, drainage or bleeding. If any of these are present, call your doctor. (Dark red, dried blood may appear under these coverings - this is normal). Document Released: 10/08/2009  Document Revised: 03/05/2012 Document Reviewed: 10/08/2009 [facility] Patient Information 2014 [place], [place].

## 2013-10-25 NOTE — Telephone Encounter (Signed)
I called and spoke with pt to inform him of his appt for his MRI at [facility] on 11/03/13 with an arrival time 3:30pm.  Pt agreeable.

## 2013-10-25 NOTE — Progress Notes (Signed)
Nicholas Stevens 42 y.o.  Body mass index is 38.55 kg/(m^2).  Patient Active Problem List   Diagnosis Date Noted  . Laparoscopic sleeve gastrectomy Oct 2014 10/08/2013  . Hypogonadism, male 07/17/2013  . Erectile dysfunction 03/22/2013  . Concentration deficit 04/27/2011  . TINEA BARBAE 03/04/2009  . LIVER HEMANGIOMA 03/04/2009  . OVERWEIGHT-BMI 42 03/04/2009  . OBSTRUCTIVE SLEEP APNEA 03/04/2009  . KNEE PAIN 03/04/2009  . DEGENERATIVE DISC DISEASE, LUMBAR SPINE 03/04/2009    No Known Allergies  Past Surgical History  Procedure Laterality Date  . Rotator cuff repair Right 2005    Dr Lajoyce Corners  . Laparoscopic appendectomy  2007    Dr Carolynne Edouard  . Breath tek h pylori N/A 08/20/2013    Procedure: BREATH TEK H PYLORI;  Surgeon: Valarie Merino, MD;  Location: [facility];  Service: General;  Laterality: N/A;  . Laparoscopic gastric sleeve resection N/A 10/08/2013    Procedure: LAPAROSCOPIC SLEEVE GASTRECTOMY;  Surgeon: Valarie Merino, MD;  Location: [facility];  Service: General;  Laterality: N/A;   [name], MD 1. Liver hemangioma     Post sleeve gastrectomy doing well.he has some incisional pain over his gastrectomy extraction site. There is a sutured therapy nothing is probably hurting him in a week holes. He is back to work. I'll see him back in a couple months. I've gone ahead and ordered an MR of his liver the we will to ask him how long we need to wait postop before he has an MR of his liver.  Return 2 months [name], MD, [facility] [phone] beeper [phone]  10/25/2013 12:12 PM

## 2013-10-29 ENCOUNTER — Encounter: Payer: 59 | Attending: Surgery | Admitting: Dietician

## 2013-10-29 VITALS — Ht 75.0 in | Wt 309.0 lb

## 2013-10-29 DIAGNOSIS — E669 Obesity, unspecified: Secondary | ICD-10-CM

## 2013-10-29 DIAGNOSIS — Z713 Dietary counseling and surveillance: Secondary | ICD-10-CM | POA: Insufficient documentation

## 2013-10-29 NOTE — Progress Notes (Signed)
Bariatric Class:  Appt start time: 1530 end time:  1630.  2 Week Post-Operative Nutrition Class  Patient was seen on 10/29/13 for Post-Operative Nutrition education [facility].   Surgery date: 10/14 Surgery type: Sleeve Gastrectomy  [facility] Weight at [facility]: 327 lbs on 6/9 Weight today: 309.0 lbs Weight change: 18 lbs Total weight lost: 18 lbs  TANITA  BODY COMP RESULTS  10/29/13   BMI (kg/m^2) 38.6   Fat Mass (lbs) 125.0   Fat Free Mass (lbs) 184.0   Total Body Water (lbs) 134.5   The following the learning objectives were met by the patient during this course:  Identifies Phase 3A (Soft, High Proteins) Dietary Goals and will begin from 2 weeks post-operatively to 2 months post-operatively  Identifies appropriate sources of fluids and proteins   States protein recommendations and appropriate sources post-operatively  Identifies the need for appropriate texture modifications, mastication, and bite sizes when consuming solids  Identifies appropriate multivitamin and calcium sources post-operatively  Describes the need for physical activity post-operatively and will follow MD recommendations  States when to call healthcare provider regarding medication questions or post-operative complications  Handouts given during class include:  Phase 3A: Soft, High Protein Diet Handout  Band Fill Guidelines Handout  Follow-Up Plan: Patient will follow-up at [facility] in 6 weeks for 8 week post-op nutrition visit for diet advancement per MD.

## 2013-10-29 NOTE — Patient Instructions (Signed)
Goals:  Follow Phase 3A: Soft High Protein Phase  Eat 3-6 small meals/snacks, every 3-5 hrs  Increase lean protein foods to meet 80g goal  Increase fluid intake to 64oz +  Avoid drinking 15 minutes before, during and 30 minutes after eating  Aim for >30 min of physical activity daily per MD   

## 2013-11-03 ENCOUNTER — Inpatient Hospital Stay: Admission: RE | Admit: 2013-11-03 | Payer: 59 | Source: Ambulatory Visit

## 2013-11-10 ENCOUNTER — Ambulatory Visit
Admission: RE | Admit: 2013-11-10 | Discharge: 2013-11-10 | Disposition: A | Discharge status: Home / Self Care | Payer: 59 | Source: Ambulatory Visit | Attending: Surgery | Admitting: Surgery

## 2013-11-10 DIAGNOSIS — D1803 Hemangioma of intra-abdominal structures: Secondary | ICD-10-CM

## 2013-11-10 MED ORDER — GADOBENATE DIMEGLUMINE 529 MG/ML IV SOLN
20.0000 mL | Freq: Once | INTRAVENOUS | Status: AC | PRN
  Administered 2013-11-10: 20 mL via INTRAVENOUS

## 2013-11-12 ENCOUNTER — Encounter (INDEPENDENT_AMBULATORY_CARE_PROVIDER_SITE_OTHER): Payer: 59 | Admitting: Surgery

## 2013-12-10 ENCOUNTER — Ambulatory Visit: Payer: 59 | Admitting: Dietician

## 2014-01-08 ENCOUNTER — Ambulatory Visit (INDEPENDENT_AMBULATORY_CARE_PROVIDER_SITE_OTHER): Payer: 59 | Admitting: Surgery

## 2014-01-21 ENCOUNTER — Telehealth: Payer: Self-pay | Admitting: Pulmonary Disease

## 2014-01-21 MED ORDER — TESTOSTERONE 20.25 MG/1.25GM (1.62%) TD GEL: 2.0000 | Freq: Every day | TRANSDERMAL | Status: DC

## 2014-01-21 NOTE — Telephone Encounter (Signed)
Pt is currently on Androgel daily. This had been removed from his active medications by another office. SN does prescribe this for him. Last OV was 06/2013. Refill has been called in. Nothing further was needed.

## 2014-01-29 ENCOUNTER — Ambulatory Visit (INDEPENDENT_AMBULATORY_CARE_PROVIDER_SITE_OTHER): Payer: 59 | Admitting: Surgery

## 2014-01-29 VITALS — BP 124/88 | HR 60 | Resp 14 | Ht 75.0 in | Wt 285.8 lb

## 2014-01-29 DIAGNOSIS — Z9884 Bariatric surgery status: Secondary | ICD-10-CM

## 2014-01-29 DIAGNOSIS — D1803 Hemangioma of intra-abdominal structures: Secondary | ICD-10-CM

## 2014-01-29 NOTE — Progress Notes (Signed)
Nicholas Stevens 43 y.o.  Body mass index is 35.72 kg/(m^2).  Patient Active Problem List   Diagnosis Date Noted  . Laparoscopic sleeve gastrectomy Oct 2014 10/08/2013  . Hypogonadism, male 07/17/2013  . Erectile dysfunction 03/22/2013  . Concentration deficit 04/27/2011  . TINEA BARBAE 03/04/2009  . LIVER HEMANGIOMA 03/04/2009  . OVERWEIGHT-BMI 42 03/04/2009  . OBSTRUCTIVE SLEEP APNEA 03/04/2009  . KNEE PAIN 03/04/2009  . DEGENERATIVE DISC DISEASE, LUMBAR SPINE 03/04/2009    No Known Allergies  Past Surgical History  Procedure Laterality Date  . Rotator cuff repair Right 2005    Dr Sharol Given  . Laparoscopic appendectomy  2007    Dr Marlou Starks  . Breath tek h pylori N/A 08/20/2013    Procedure: BREATH TEK H PYLORI;  Surgeon: Pedro Earls, MD;  Location: [facility];  Service: General;  Laterality: N/A;  . Laparoscopic gastric sleeve resection N/A 10/08/2013    Procedure: LAPAROSCOPIC SLEEVE GASTRECTOMY;  Surgeon: Pedro Earls, MD;  Location: [facility];  Service: General;  Laterality: N/A;   Noralee Space, MD No diagnosis found.  Has lost 47 lbs since sleeve gastrectomy.  Doing very well.  Will discuss long-term management of liver hemangiomata with Dr. Barry Dienes.    MRI report:  Multiple lesions scattered throughout the liver parenchyma with  imaging features consistent with cavernous hemangiomata. The largest  lesion is a giant hemangioma which essentially replaces much of the  right hepatic lobe and generates compression and mass effect on the  right portal vein. This lesion has progressed in the interval since  the prior study.  A 2nd progressive lesion replaces and expands the caudate lobe and  is also consistent with benign cavernous hemangioma. Despite the  proximity to the inferior vena cava and porta hepatis, this lesion  generates no substantial mass effect on the portal vein or IVC.  There is no associated intrahepatic biliary duct dilatation.  Other scattered smaller  lesions within the liver parenchyma also  have imaging features consistent with hemangiomata.  Matt B. Hassell Done, MD, [facility] [phone] beeper [phone]  01/29/2014 9:35 AM

## 2014-01-29 NOTE — Patient Instructions (Signed)
Thanks for your patience.  If you need further assistance after leaving the office, please call our office and speak with a [facility] nurse.  [phone].  If you want to leave a message for Dr. Hassell Done, please call his office phone at [phone]., and an

## 2014-05-27 ENCOUNTER — Encounter: Payer: 59 | Admitting: Internal Medicine

## 2014-06-13 ENCOUNTER — Ambulatory Visit
Admission: RE | Admit: 2014-06-13 | Discharge: 2014-06-13 | Disposition: A | Discharge status: Home / Self Care | Payer: Worker's Compensation | Source: Ambulatory Visit | Attending: Family | Admitting: Family

## 2014-06-13 ENCOUNTER — Other Ambulatory Visit: Payer: Self-pay | Admitting: Family

## 2014-06-13 DIAGNOSIS — T1490XA Injury, unspecified, initial encounter: Secondary | ICD-10-CM

## 2014-06-13 DIAGNOSIS — R52 Pain, unspecified: Secondary | ICD-10-CM

## 2014-06-13 DIAGNOSIS — R609 Edema, unspecified: Secondary | ICD-10-CM

## 2014-09-04 ENCOUNTER — Other Ambulatory Visit: Payer: Self-pay | Admitting: Pulmonary Disease

## 2014-11-04 ENCOUNTER — Ambulatory Visit (INDEPENDENT_AMBULATORY_CARE_PROVIDER_SITE_OTHER): Payer: 59 | Admitting: Internal Medicine

## 2014-11-04 ENCOUNTER — Encounter: Payer: Self-pay | Admitting: Internal Medicine

## 2014-11-04 VITALS — BP 122/80 | HR 77 | Temp 98.1°F | Ht 74.5 in | Wt 278.0 lb

## 2014-11-04 DIAGNOSIS — Z23 Encounter for immunization: Secondary | ICD-10-CM

## 2014-11-04 DIAGNOSIS — E291 Testicular hypofunction: Secondary | ICD-10-CM

## 2014-11-04 DIAGNOSIS — Z0000 Encounter for general adult medical examination without abnormal findings: Secondary | ICD-10-CM

## 2014-11-04 DIAGNOSIS — G4733 Obstructive sleep apnea (adult) (pediatric): Secondary | ICD-10-CM

## 2014-11-04 DIAGNOSIS — B350 Tinea barbae and tinea capitis: Secondary | ICD-10-CM

## 2014-11-04 DIAGNOSIS — Z9884 Bariatric surgery status: Secondary | ICD-10-CM

## 2014-11-04 MED ORDER — METRONIDAZOLE 0.75 % EX LOTN: TOPICAL_LOTION | CUTANEOUS | Status: DC

## 2014-11-04 MED ORDER — VITAMIN D 1000 UNITS PO TABS: 1000.0000 [IU] | ORAL_TABLET | Freq: Every day | ORAL | Status: AC

## 2014-11-04 NOTE — Progress Notes (Signed)
Pre visit review using our clinic review tool, if applicable. No additional management support is needed unless otherwise documented below in the visit note. 

## 2014-11-04 NOTE — Assessment & Plan Note (Signed)
Doing well 

## 2014-11-04 NOTE — Assessment & Plan Note (Signed)
We discussed age appropriate health related issues, including available/recomended screening tests and vaccinations. We discussed a need for adhering to healthy diet and exercise. Labs/EKG were reviewed/ordered. All questions were answered.  Declined flu tDAP

## 2014-11-04 NOTE — Progress Notes (Signed)
   Subjective:    Patient ID: Nicholas Stevens, male    DOB: 25-Jun-1971, 43 y.o.   MRN: [id number]  HPI  Former Dr Jeannine Kitten pt New pt  The patient is here for a wellness exam. The patient has been doing well overall without major physical or psychological issues going on lately. F/u hypogonadism - out of Androgel x 2 mo  Pt lost 50 lbs after a sleeve procedure in Oct 2014 Dr Roseanne Kaufman Readings from Last 3 Encounters:  11/04/14 278 lb (126.1 kg)  01/29/14 285 lb 12.8 oz (129.638 kg)  10/29/13 309 lb (140.161 kg)   BP Readings from Last 3 Encounters:  11/04/14 122/80  01/29/14 124/88  10/25/13 138/96    Review of Systems  Constitutional: Negative for appetite change, fatigue and unexpected weight change.  HENT: Negative for congestion, nosebleeds, sneezing, sore throat and trouble swallowing.   Eyes: Negative for itching and visual disturbance.  Respiratory: Negative for cough.   Cardiovascular: Negative for chest pain, palpitations and leg swelling.  Gastrointestinal: Negative for nausea, diarrhea, blood in stool and abdominal distention.  Genitourinary: Negative for frequency and hematuria.  Musculoskeletal: Negative for back pain, joint swelling, gait problem and neck pain.  Skin: Negative for rash.  Neurological: Negative for dizziness, tremors, speech difficulty and weakness.  Psychiatric/Behavioral: Negative for sleep disturbance, dysphoric mood and agitation. The patient is not nervous/anxious.        Objective:   Physical Exam  Constitutional: He is oriented to person, place, and time. He appears well-developed. No distress.  NAD  HENT:  Mouth/Throat: Oropharynx is clear and moist.  Eyes: Conjunctivae are normal. Pupils are equal, round, and reactive to light.  Neck: Normal range of motion. No JVD present. No thyromegaly present.  Cardiovascular: Normal rate, regular rhythm, normal heart sounds and intact distal pulses.  Exam reveals no gallop and no friction  rub.   No murmur heard. Pulmonary/Chest: Effort normal and breath sounds normal. No respiratory distress. He has no wheezes. He has no rales. He exhibits no tenderness.  Abdominal: Soft. Bowel sounds are normal. He exhibits no distension and no mass. There is no tenderness. There is no rebound and no guarding.  Musculoskeletal: Normal range of motion. He exhibits no edema or tenderness.  Lymphadenopathy:    He has no cervical adenopathy.  Neurological: He is alert and oriented to person, place, and time. He has normal reflexes. No cranial nerve deficit. He exhibits normal muscle tone. He displays a negative Romberg sign. Coordination and gait normal.  No meningeal signs  Skin: Skin is warm and dry. No rash noted.  Psychiatric: He has a normal mood and affect. His behavior is normal. Judgment and thought content normal.    Lab Results  Component Value Date   WBC 6.1 10/10/2013   HGB 14.7 10/10/2013   HCT 43.3 10/10/2013   PLT 153 10/10/2013   GLUCOSE 86 10/03/2013   ALT 28 10/03/2013   AST 22 10/03/2013   NA 138 10/03/2013   K 3.9 10/03/2013   CL 102 10/03/2013   CREATININE 0.83 10/08/2013   BUN 8 10/03/2013   CO2 25 10/03/2013   TSH 1.245 08/19/2013   PSA 0.56 03/22/2013         Assessment & Plan:

## 2014-11-04 NOTE — Assessment & Plan Note (Signed)
Chronic Off CPAP after his sleeve procedure 2014

## 2014-11-04 NOTE — Patient Instructions (Signed)

## 2014-11-04 NOTE — Assessment & Plan Note (Signed)
Copy  [employer] lotion Rx qhs

## 2014-11-04 NOTE — Assessment & Plan Note (Signed)
Re-check testosterone after wt loss

## 2014-11-21 ENCOUNTER — Other Ambulatory Visit (INDEPENDENT_AMBULATORY_CARE_PROVIDER_SITE_OTHER): Payer: 59

## 2014-11-21 DIAGNOSIS — Z0000 Encounter for general adult medical examination without abnormal findings: Secondary | ICD-10-CM

## 2014-11-21 DIAGNOSIS — Z9884 Bariatric surgery status: Secondary | ICD-10-CM

## 2014-11-21 DIAGNOSIS — E291 Testicular hypofunction: Secondary | ICD-10-CM

## 2014-11-21 LAB — BASIC METABOLIC PANEL
BUN: 9 mg/dL (ref 6–23)
CO2: 30 meq/L (ref 19–32)
Calcium: 9.5 mg/dL (ref 8.4–10.5)
Chloride: 105 mEq/L (ref 96–112)
Creatinine, Ser: 0.7 mg/dL (ref 0.4–1.5)
GFR: 147.05 mL/min (ref >60.00)
GLUCOSE: 78 mg/dL (ref 70–99)
Potassium: 4.5 mEq/L (ref 3.5–5.1)
Sodium: 140 mEq/L (ref 135–145)

## 2014-11-21 LAB — TSH: TSH: 0.87 u[IU]/mL (ref 0.35–4.50)

## 2014-11-21 LAB — VITAMIN D 25 HYDROXY (VIT D DEFICIENCY, FRACTURES): VITD: 21.54 ng/mL — AB (ref 30.00–100.00)

## 2014-11-21 LAB — URINALYSIS
Bilirubin Urine: NEGATIVE
Hgb urine dipstick: NEGATIVE
Ketones, ur: NEGATIVE
Leukocytes, UA: NEGATIVE
NITRITE: NEGATIVE
PH: 8 (ref 5.0–8.0)
SPECIFIC GRAVITY, URINE: 1.015 (ref 1.000–1.030)
Total Protein, Urine: NEGATIVE
UROBILINOGEN UA: 0.2 (ref 0.0–1.0)
Urine Glucose: NEGATIVE

## 2014-11-21 LAB — HEPATIC FUNCTION PANEL
ALBUMIN: 4.2 g/dL (ref 3.5–5.2)
ALT: 38 U/L (ref 0–53)
AST: 28 U/L (ref 0–37)
Alkaline Phosphatase: 62 U/L (ref 39–117)
BILIRUBIN TOTAL: 0.9 mg/dL (ref 0.2–1.2)
Bilirubin, Direct: 0.1 mg/dL (ref 0.0–0.3)
Total Protein: 7.2 g/dL (ref 6.0–8.3)

## 2014-11-21 LAB — CBC WITH DIFFERENTIAL/PLATELET
BASOS ABS: 0 10*3/uL (ref 0.0–0.1)
Basophils Relative: 0.3 % (ref 0.0–3.0)
EOS ABS: 0.1 10*3/uL (ref 0.0–0.7)
Eosinophils Relative: 1.9 % (ref 0.0–5.0)
HCT: 44 % (ref 39.0–52.0)
HEMOGLOBIN: 14.3 g/dL (ref 13.0–17.0)
Lymphocytes Relative: 32.4 % (ref 12.0–46.0)
Lymphs Abs: 1.5 10*3/uL (ref 0.7–4.0)
MCHC: 32.4 g/dL (ref 30.0–36.0)
MCV: 85.6 fl (ref 78.0–100.0)
MONOS PCT: 8.2 % (ref 3.0–12.0)
Monocytes Absolute: 0.4 10*3/uL (ref 0.1–1.0)
NEUTROS ABS: 2.7 10*3/uL (ref 1.4–7.7)
Neutrophils Relative %: 57.2 % (ref 43.0–77.0)
PLATELETS: 246 10*3/uL (ref 150.0–400.0)
RBC: 5.14 Mil/uL (ref 4.22–5.81)
RDW: 13.7 % (ref 11.5–15.5)
WBC: 4.6 10*3/uL (ref 4.0–10.5)

## 2014-11-21 LAB — LIPID PANEL
CHOLESTEROL: 163 mg/dL (ref 0–200)
HDL: 36.7 mg/dL — ABNORMAL LOW (ref >39.00)
LDL CALC: 112 mg/dL — AB (ref 0–99)
NonHDL: 126.3
TRIGLYCERIDES: 73 mg/dL (ref 0.0–149.0)
Total CHOL/HDL Ratio: 4
VLDL: 14.6 mg/dL (ref 0.0–40.0)

## 2014-11-21 LAB — VITAMIN B12: Vitamin B-12: 1382 pg/mL — ABNORMAL HIGH (ref 211–911)

## 2014-11-21 LAB — PSA: PSA: 0.41 ng/mL (ref 0.10–4.00)

## 2014-11-23 ENCOUNTER — Other Ambulatory Visit: Payer: Self-pay | Admitting: Internal Medicine

## 2014-11-23 MED ORDER — ERGOCALCIFEROL 1.25 MG (50000 UT) PO CAPS
50000.0000 [IU] | ORAL_CAPSULE | ORAL | Status: DC
Start: 2014-11-23 — End: 2015-04-15

## 2014-11-24 LAB — TESTOSTERONE, FREE, TOTAL, SHBG
SEX HORMONE BINDING: 37 nmol/L (ref 13–71)
TESTOSTERONE FREE: 68.2 pg/mL (ref 47.0–244.0)
TESTOSTERONE-% FREE: 1.9 % (ref 1.6–2.9)
Testosterone: 364 ng/dL (ref 300–890)

## 2015-04-08 ENCOUNTER — Telehealth: Payer: Self-pay | Admitting: Internal Medicine

## 2015-04-15 ENCOUNTER — Ambulatory Visit (INDEPENDENT_AMBULATORY_CARE_PROVIDER_SITE_OTHER): Payer: 59 | Admitting: Internal Medicine

## 2015-04-15 ENCOUNTER — Encounter: Payer: Self-pay | Admitting: Internal Medicine

## 2015-04-15 VITALS — BP 130/96 | HR 66 | Wt 288.0 lb

## 2015-04-15 DIAGNOSIS — G4733 Obstructive sleep apnea (adult) (pediatric): Secondary | ICD-10-CM | POA: Diagnosis not present

## 2015-04-15 DIAGNOSIS — E291 Testicular hypofunction: Secondary | ICD-10-CM

## 2015-04-15 DIAGNOSIS — G4700 Insomnia, unspecified: Secondary | ICD-10-CM

## 2015-04-15 MED ORDER — TRAZODONE HCL 50 MG PO TABS: 25.0000 mg | ORAL_TABLET | Freq: Every evening | ORAL | Status: DC | PRN

## 2015-04-15 NOTE — Patient Instructions (Addendum)
Try Valerian root for insomnia  There are natural ways to boost your testosterone:  1. Lose Weight If you're overweight, shedding the excess pounds may increase your testosterone levels, according to multiple research. Overweight men are more likely to have low testosterone levels to begin with, so this is an important trick to increase your body's testosterone production when you need it most.   2. Strength Training    Strength training is also known to boost testosterone levels, provided you are doing so intensely enough. When strength training to boost testosterone, you'll want to increase the weight and lower your number of reps, and then focus on exercises that work a large number of muscles.  3. Optimize Your Vitamin D Levels Vitamin D, a steroid hormone, is essential for the healthy development of the nucleus of the sperm cell, and helps maintain semen quality and sperm count. Vitamin D also increases levels of testosterone, which may boost libido. In one study, overweight men who were given vitamin D supplements had a significant increase in testosterone levels after one year.  4. Reduce Stress When you're under a lot of stress, your body releases high levels of the stress hormone cortisol. This hormone actually blocks the effects of testosterone, presumably because, from a biological standpoint, testosterone-associated behaviors (mating, competing, aggression) may have lowered your chances of survival in an emergency (hence, the "fight or flight" response is dominant, courtesy of cortisol).  5. Limit or Eliminate Sugar from Your Diet Testosterone levels decrease after you eat sugar, which is likely because the sugar leads to a high insulin level, another factor leading to low testosterone.  6. Eat Healthy Fats By healthy, this means not only mon- and polyunsaturated fats, like that found in avocadoes and nuts, but also saturated, as these are essential for building testosterone.  Research shows that a diet with less than 40 percent of energy as fat (and that mainly from animal sources, i.e. saturated) lead to a decrease in testosterone levels.  It's important to understand that your body requires saturated fats from animal and vegetable sources (such as meat, dairy, certain oils, and tropical plants like coconut) for optimal functioning, and if you neglect this important food group in favor of sugar, grains and other starchy carbs, your health and weight are almost guaranteed to suffer. Examples of healthy fats you can eat more of to give your testosterone levels a boost include:  Olives and Olive oil  Coconuts and coconut oil Butter made from organic milk  Raw nuts, such as, almonds or pecans Eggs Avocados   Meats Palm oil Unheated organic nut oils   7. "Testosterone boosters" containing Vitamin D-3, Niacin, Vitamin B-6, Vitamin B-12, Magnesium, Zinc, Selenium, D-Aspartic Acid, Fenugreed Seed Extract, Oystershell, [place] Extract, [place] Ginseng may be helpful as well.

## 2015-04-15 NOTE — Assessment & Plan Note (Signed)
Doing well 

## 2015-04-15 NOTE — Assessment & Plan Note (Signed)
4/16: insomnia x 3 mo - waking up a lot. No urinary issues and no nocturia. Working day shift. No caffeine. Will try Trazodone at hs Side effects discussed

## 2015-04-15 NOTE — Assessment & Plan Note (Signed)
See diet/supplements recommendations, wt loss Ff Androgel now

## 2015-04-15 NOTE — Progress Notes (Signed)
Pre visit review using our clinic review tool, if applicable. No additional management support is needed unless otherwise documented below in the visit note. 

## 2015-04-15 NOTE — Progress Notes (Signed)
   Subjective:    HPI  C/o insomnia x 3 mo - waking up a lot. No urinary issues and no nocturia. Working day shift. No caffeine. F/u decreased testosterone  Pt lost 50 lbs after a sleeve procedure in Oct 2014 Dr Roseanne Kaufman Readings from Last 3 Encounters:  04/15/15 288 lb (130.636 kg)  11/04/14 278 lb (126.1 kg)  01/29/14 285 lb 12.8 oz (129.638 kg)   BP Readings from Last 3 Encounters:  04/15/15 130/96  11/04/14 122/80  01/29/14 124/88    Review of Systems  Constitutional: Negative for appetite change, fatigue and unexpected weight change.  HENT: Negative for congestion, nosebleeds, sneezing, sore throat and trouble swallowing.   Eyes: Negative for itching and visual disturbance.  Respiratory: Negative for cough.   Cardiovascular: Negative for chest pain, palpitations and leg swelling.  Gastrointestinal: Negative for nausea, diarrhea, blood in stool and abdominal distention.  Genitourinary: Negative for frequency and hematuria.  Musculoskeletal: Negative for back pain, joint swelling, gait problem and neck pain.  Skin: Negative for rash.  Neurological: Negative for dizziness, tremors, speech difficulty and weakness.  Psychiatric/Behavioral: Positive for sleep disturbance. Negative for dysphoric mood and agitation. The patient is not nervous/anxious.        Objective:   Physical Exam  Constitutional: He is oriented to person, place, and time. He appears well-developed. No distress.  NAD  HENT:  Mouth/Throat: Oropharynx is clear and moist.  Eyes: Conjunctivae are normal. Pupils are equal, round, and reactive to light.  Neck: Normal range of motion. No JVD present. No thyromegaly present.  Cardiovascular: Normal rate, regular rhythm, normal heart sounds and intact distal pulses.  Exam reveals no gallop and no friction rub.   No murmur heard. Pulmonary/Chest: Effort normal and breath sounds normal. No respiratory distress. He has no wheezes. He has no rales. He exhibits no  tenderness.  Abdominal: Soft. Bowel sounds are normal. He exhibits no distension and no mass. There is no tenderness. There is no rebound and no guarding.  Musculoskeletal: Normal range of motion. He exhibits no edema or tenderness.  Lymphadenopathy:    He has no cervical adenopathy.  Neurological: He is alert and oriented to person, place, and time. He has normal reflexes. No cranial nerve deficit. He exhibits normal muscle tone. He displays a negative Romberg sign. Coordination and gait normal.  Skin: Skin is warm and dry. No rash noted.  Psychiatric: He has a normal mood and affect. His behavior is normal. Judgment and thought content normal.    Lab Results  Component Value Date   WBC 4.6 11/21/2014   HGB 14.3 11/21/2014   HCT 44.0 11/21/2014   PLT 246.0 11/21/2014   GLUCOSE 78 11/21/2014   CHOL 163 11/21/2014   TRIG 73.0 11/21/2014   HDL 36.70* 11/21/2014   LDLCALC 112* 11/21/2014   ALT 38 11/21/2014   AST 28 11/21/2014   NA 140 11/21/2014   K 4.5 11/21/2014   CL 105 11/21/2014   CREATININE 0.7 11/21/2014   BUN 9 11/21/2014   CO2 30 11/21/2014   TSH 0.87 11/21/2014   PSA 0.41 11/21/2014         Assessment & Plan:  Patient ID: Nicholas Stevens, male   DOB: [birth year], 44 y.o.   MRN: [id number]

## 2015-08-04 ENCOUNTER — Emergency Department (HOSPITAL_COMMUNITY): Payer: Worker's Compensation

## 2015-08-04 ENCOUNTER — Emergency Department (HOSPITAL_COMMUNITY)
Admission: EM | Admit: 2015-08-04 | Discharge: 2015-08-04 | Disposition: A | Discharge status: Home / Self Care | Payer: Worker's Compensation | Attending: Emergency Medicine | Admitting: Emergency Medicine

## 2015-08-04 ENCOUNTER — Encounter (HOSPITAL_COMMUNITY): Payer: Self-pay

## 2015-08-04 DIAGNOSIS — Z8669 Personal history of other diseases of the nervous system and sense organs: Secondary | ICD-10-CM | POA: Insufficient documentation

## 2015-08-04 DIAGNOSIS — S40811A Abrasion of right upper arm, initial encounter: Secondary | ICD-10-CM | POA: Insufficient documentation

## 2015-08-04 DIAGNOSIS — Z79899 Other long term (current) drug therapy: Secondary | ICD-10-CM | POA: Insufficient documentation

## 2015-08-04 DIAGNOSIS — Y998 Other external cause status: Secondary | ICD-10-CM | POA: Insufficient documentation

## 2015-08-04 DIAGNOSIS — S50312A Abrasion of left elbow, initial encounter: Secondary | ICD-10-CM | POA: Diagnosis not present

## 2015-08-04 DIAGNOSIS — Z862 Personal history of diseases of the blood and blood-forming organs and certain disorders involving the immune mechanism: Secondary | ICD-10-CM | POA: Insufficient documentation

## 2015-08-04 DIAGNOSIS — Z8619 Personal history of other infectious and parasitic diseases: Secondary | ICD-10-CM | POA: Diagnosis not present

## 2015-08-04 DIAGNOSIS — S50311A Abrasion of right elbow, initial encounter: Secondary | ICD-10-CM | POA: Diagnosis not present

## 2015-08-04 DIAGNOSIS — Y9389 Activity, other specified: Secondary | ICD-10-CM | POA: Diagnosis not present

## 2015-08-04 DIAGNOSIS — Y9289 Other specified places as the place of occurrence of the external cause: Secondary | ICD-10-CM | POA: Insufficient documentation

## 2015-08-04 DIAGNOSIS — S80212A Abrasion, left knee, initial encounter: Secondary | ICD-10-CM | POA: Diagnosis not present

## 2015-08-04 DIAGNOSIS — S40212A Abrasion of left shoulder, initial encounter: Secondary | ICD-10-CM | POA: Diagnosis not present

## 2015-08-04 DIAGNOSIS — S4991XA Unspecified injury of right shoulder and upper arm, initial encounter: Secondary | ICD-10-CM | POA: Diagnosis present

## 2015-08-04 MED ORDER — BACITRACIN ZINC 500 UNIT/GM EX OINT
1.0000 "application " | TOPICAL_OINTMENT | Freq: Two times a day (BID) | CUTANEOUS | Status: DC
  Administered 2015-08-04: 1 via TOPICAL

## 2015-08-04 MED ORDER — BACITRACIN ZINC 500 UNIT/GM EX OINT
TOPICAL_OINTMENT | CUTANEOUS | Status: AC
  Administered 2015-08-04: 1 via TOPICAL
  Filled 2015-08-04: qty 0.9

## 2015-08-04 MED ORDER — BACITRACIN ZINC 500 UNIT/GM EX OINT: 1.0000 "application " | TOPICAL_OINTMENT | Freq: Two times a day (BID) | CUTANEOUS | Status: DC
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
## 2015-08-04 NOTE — Discharge Instructions (Signed)

## 2015-08-04 NOTE — ED Notes (Signed)
MD at bedside. 

## 2015-09-05 ENCOUNTER — Emergency Department (HOSPITAL_COMMUNITY)
Admission: EM | Admit: 2015-09-05 | Discharge: 2015-09-05 | Disposition: A | Discharge status: Home / Self Care | Payer: 59 | Attending: Emergency Medicine | Admitting: Emergency Medicine

## 2015-09-05 ENCOUNTER — Encounter (HOSPITAL_COMMUNITY): Payer: Self-pay | Admitting: Emergency Medicine

## 2015-09-05 DIAGNOSIS — Z79899 Other long term (current) drug therapy: Secondary | ICD-10-CM | POA: Insufficient documentation

## 2015-09-05 DIAGNOSIS — Z86018 Personal history of other benign neoplasm: Secondary | ICD-10-CM | POA: Insufficient documentation

## 2015-09-05 DIAGNOSIS — E663 Overweight: Secondary | ICD-10-CM | POA: Insufficient documentation

## 2015-09-05 DIAGNOSIS — Z8619 Personal history of other infectious and parasitic diseases: Secondary | ICD-10-CM | POA: Insufficient documentation

## 2015-09-05 DIAGNOSIS — M5431 Sciatica, right side: Secondary | ICD-10-CM

## 2015-09-05 DIAGNOSIS — G588 Other specified mononeuropathies: Secondary | ICD-10-CM | POA: Insufficient documentation

## 2015-09-05 DIAGNOSIS — Z792 Long term (current) use of antibiotics: Secondary | ICD-10-CM | POA: Insufficient documentation

## 2015-09-05 DIAGNOSIS — I10 Essential (primary) hypertension: Secondary | ICD-10-CM

## 2015-09-05 MED ORDER — HYDROMORPHONE HCL 1 MG/ML IJ SOLN
2.0000 mg | Freq: Once | INTRAMUSCULAR | Status: AC
  Administered 2015-09-05: 2 mg via INTRAMUSCULAR
  Filled 2015-09-05: qty 2

## 2015-09-05 MED ORDER — ONDANSETRON 4 MG PO TBDP
8.0000 mg | ORAL_TABLET | Freq: Once | ORAL | Status: AC
  Administered 2015-09-05: 8 mg via ORAL
  Filled 2015-09-05: qty 2

## 2015-09-05 MED ORDER — OXYCODONE-ACETAMINOPHEN 5-325 MG PO TABS: 1.0000 | ORAL_TABLET | ORAL | Status: DC | PRN

## 2015-09-05 MED ORDER — DEXAMETHASONE SODIUM PHOSPHATE 10 MG/ML IJ SOLN
10.0000 mg | Freq: Once | INTRAMUSCULAR | Status: AC
  Administered 2015-09-05: 10 mg via INTRAMUSCULAR
  Filled 2015-09-05: qty 1

## 2015-09-05 MED ORDER — CYCLOBENZAPRINE HCL 10 MG PO TABS: 10.0000 mg | ORAL_TABLET | Freq: Two times a day (BID) | ORAL | Status: DC | PRN

## 2015-09-05 MED ORDER — NAPROXEN 500 MG PO TABS: 500.0000 mg | ORAL_TABLET | Freq: Two times a day (BID) | ORAL | Status: DC

## 2015-09-05 NOTE — ED Notes (Signed)
Pt reports right lower back pain that shoots down his right leg. Pt saw a chiropractor twice with relief. Pt decided to sleep on the floor last night because he thought it might be his mattress. Pt woke up with increased back pain and now his right foot is numb.
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
## 2015-09-05 NOTE — ED Notes (Signed)
pts vital signs updated pt awaiting discharge paperwork at bedside.  

## 2015-09-05 NOTE — Discharge Instructions (Signed)
Dr. Vickey Huger [place] [address] [phone] Please tell them you were referred by Margarita Mail   Sciatica Sciatica is pain, weakness, numbness, or tingling along the path of the sciatic nerve. The nerve starts in the lower back and runs down the back of each leg. The nerve controls the muscles in the lower leg and in the back of the knee, while also providing sensation to the back of the thigh, lower leg, and the sole of your foot. Sciatica is a symptom of another medical condition. For instance, nerve damage or certain conditions, such as a herniated disk or bone spur on the spine, pinch or put pressure on the sciatic nerve. This causes the pain, weakness, or other sensations normally associated with sciatica. Generally, sciatica only affects one side of the body. CAUSES   Herniated or slipped disc.  Degenerative disk disease.  A pain disorder involving the narrow muscle in the buttocks (piriformis syndrome).  Pelvic injury or fracture.  Pregnancy.  Tumor (rare). SYMPTOMS  Symptoms can vary from mild to very severe. The symptoms usually travel from the low back to the buttocks and down the back of the leg. Symptoms can include:  Mild tingling or dull aches in the lower back, leg, or hip.  Numbness in the back of the calf or sole of the foot.  Burning sensations in the lower back, leg, or hip.  Sharp pains in the lower back, leg, or hip.  Leg weakness.  Severe back pain inhibiting movement. These symptoms may get worse with coughing, sneezing, laughing, or prolonged sitting or standing. Also, being overweight may worsen symptoms. DIAGNOSIS  Your caregiver will perform a physical exam to look for common symptoms of sciatica. He or she may ask you to do certain movements or activities that would trigger sciatic nerve pain. Other tests may be performed to find the cause of the sciatica. These may include:  Blood tests.  X-rays.  Imaging tests,  such as an MRI or CT scan. TREATMENT  Treatment is directed at the cause of the sciatic pain. Sometimes, treatment is not necessary and the pain and discomfort goes away on its own. If treatment is needed, your caregiver may suggest:  Over-the-counter medicines to relieve pain.  Prescription medicines, such as anti-inflammatory medicine, muscle relaxants, or narcotics.  Applying heat or ice to the painful area.  Steroid injections to lessen pain, irritation, and inflammation around the nerve.  Reducing activity during periods of pain.  Exercising and stretching to strengthen your abdomen and improve flexibility of your spine. Your caregiver may suggest losing weight if the extra weight makes the back pain worse.  Physical therapy.  Surgery to eliminate what is pressing or pinching the nerve, such as a bone spur or part of a herniated disk. HOME CARE INSTRUCTIONS   Only take over-the-counter or prescription medicines for pain or discomfort as directed by your caregiver.  Apply ice to the affected area for 20 minutes, 3-4 times a day for the first 48-72 hours. Then try heat in the same way.  Exercise, stretch, or perform your usual activities if these do not aggravate your pain.  Attend physical therapy sessions as directed by your caregiver.  Keep all follow-up appointments as directed by your caregiver.  Do not wear high heels or shoes that do not provide proper support.  Check your mattress to see if it is too soft. A firm mattress may lessen your pain and discomfort. SEEK IMMEDIATE MEDICAL CARE IF:  You lose control of your bowel or bladder (incontinence).  You have increasing weakness in the lower back, pelvis, buttocks, or legs.  You have redness or swelling of your back.  You have a burning sensation when you urinate.  You have pain that gets worse when you lie down or awakens you at night.  Your pain is worse than you have experienced in the past.  Your pain is  lasting longer than 4 weeks.  You are suddenly losing weight without reason. MAKE SURE YOU:  Understand these instructions.  Will watch your condition.  Will get help right away if you are not doing well or get worse. Document Released: 12/06/2001 Document Revised: 06/12/2012 Document Reviewed: 04/22/2012 [facility] Patient Information 2015 [place], [place]. This information is not intended to replace advice given to you by your health care provider. Make sure you discuss any questions you have with your health care provider.  Sciatica with Rehab The sciatic nerve runs from the back down the leg and is responsible for sensation and control of the muscles in the back (posterior) side of the thigh, lower leg, and foot. Sciatica is a condition that is characterized by inflammation of this nerve.  SYMPTOMS   Signs of nerve damage, including numbness and/or weakness along the posterior side of the lower extremity.  Pain in the back of the thigh that may also travel down the leg.  Pain that worsens when sitting for long periods of time.  Occasionally, pain in the back or buttock. CAUSES  Inflammation of the sciatic nerve is the cause of sciatica. The inflammation is due to something irritating the nerve. Common sources of irritation include:  Sitting for long periods of time.  Direct trauma to the nerve.  Arthritis of the spine.  Herniated or ruptured disk.  Slipping of the vertebrae (spondylolisthesis).  Pressure from soft tissues, such as muscles or ligament-like tissue (fascia). RISK INCREASES WITH:  Sports that place pressure or stress on the spine (football or weightlifting).  Poor strength and flexibility.  Failure to warm up properly before activity.  Family history of low back pain or disk disorders.  Previous back injury or surgery.  Poor body mechanics, especially when lifting, or poor posture. PREVENTION   Warm up and stretch properly before  activity.  Maintain physical fitness:  Strength, flexibility, and endurance.  Cardiovascular fitness.  Learn and use proper technique, especially with posture and lifting. When possible, have coach correct improper technique.  Avoid activities that place stress on the spine. PROGNOSIS If treated properly, then sciatica usually resolves within 6 weeks. However, occasionally surgery is necessary.  RELATED COMPLICATIONS   Permanent nerve damage, including pain, numbness, tingle, or weakness.  Chronic back pain.  Risks of surgery: infection, bleeding, nerve damage, or damage to surrounding tissues. TREATMENT Treatment initially involves resting from any activities that aggravate your symptoms. The use of ice and medication may help reduce pain and inflammation. The use of strengthening and stretching exercises may help reduce pain with activity. These exercises may be performed at home or with referral to a therapist. A therapist may recommend further treatments, such as transcutaneous electronic nerve stimulation (TENS) or ultrasound. Your caregiver may recommend corticosteroid injections to help reduce inflammation of the sciatic nerve. If symptoms persist despite non-surgical (conservative) treatment, then surgery may be recommended. MEDICATION  If pain medication is necessary, then nonsteroidal anti-inflammatory medications, such as aspirin and ibuprofen, or other minor pain relievers, such as acetaminophen, are often recommended.  Do not take  pain medication for 7 days before surgery.  Prescription pain relievers may be given if deemed necessary by your caregiver. Use only as directed and only as much as you need.  Ointments applied to the skin may be helpful.  Corticosteroid injections may be given by your caregiver. These injections should be reserved for the most serious cases, because they may only be given a certain number of times. HEAT AND COLD  Cold treatment (icing)  relieves pain and reduces inflammation. Cold treatment should be applied for 10 to 15 minutes every 2 to 3 hours for inflammation and pain and immediately after any activity that aggravates your symptoms. Use ice packs or massage the area with a piece of ice (ice massage).  Heat treatment may be used prior to performing the stretching and strengthening activities prescribed by your caregiver, physical therapist, or athletic trainer. Use a heat pack or soak the injury in warm water. SEEK MEDICAL CARE IF:  Treatment seems to offer no benefit, or the condition worsens.  Any medications produce adverse side effects. EXERCISES  RANGE OF MOTION (ROM) AND STRETCHING EXERCISES - Sciatica Most people with sciatic will find that their symptoms worsen with either excessive bending forward (flexion) or arching at the low back (extension). The exercises which will help resolve your symptoms will focus on the opposite motion. Your physician, physical therapist or athletic trainer will help you determine which exercises will be most helpful to resolve your low back pain. Do not complete any exercises without first consulting with your clinician. Discontinue any exercises which worsen your symptoms until you speak to your clinician. If you have pain, numbness or tingling which travels down into your buttocks, leg or foot, the goal of the therapy is for these symptoms to move closer to your back and eventually resolve. Occasionally, these leg symptoms will get better, but your low back pain may worsen; this is typically an indication of progress in your rehabilitation. Be certain to be very alert to any changes in your symptoms and the activities in which you participated in the 24 hours prior to the change. Sharing this information with your clinician will allow him/her to most efficiently treat your condition. These exercises may help you when beginning to rehabilitate your injury. Your symptoms may resolve with or  without further involvement from your physician, physical therapist or athletic trainer. While completing these exercises, remember:   Restoring tissue flexibility helps normal motion to return to the joints. This allows healthier, less painful movement and activity.  An effective stretch should be held for at least 30 seconds.  A stretch should never be painful. You should only feel a gentle lengthening or release in the stretched tissue. FLEXION RANGE OF MOTION AND STRETCHING EXERCISES: STRETCH - Flexion, Single Knee to Chest   Lie on a firm bed or floor with both legs extended in front of you.  Keeping one leg in contact with the floor, bring your opposite knee to your chest. Hold your leg in place by either grabbing behind your thigh or at your knee.  Pull until you feel a gentle stretch in your low back. Hold __________ seconds.  Slowly release your grasp and repeat the exercise with the opposite side. Repeat __________ times. Complete this exercise __________ times per day.  STRETCH - Flexion, Double Knee to Chest  Lie on a firm bed or floor with both legs extended in front of you.  Keeping one leg in contact with the floor, bring your opposite knee  to your chest.  Tense your stomach muscles to support your back and then lift your other knee to your chest. Hold your legs in place by either grabbing behind your thighs or at your knees.  Pull both knees toward your chest until you feel a gentle stretch in your low back. Hold __________ seconds.  Tense your stomach muscles and slowly return one leg at a time to the floor. Repeat __________ times. Complete this exercise __________ times per day.  STRETCH - Low Trunk Rotation   Lie on a firm bed or floor. Keeping your legs in front of you, bend your knees so they are both pointed toward the ceiling and your feet are flat on the floor.  Extend your arms out to the side. This will stabilize your upper body by keeping your shoulders  in contact with the floor.  Gently and slowly drop both knees together to one side until you feel a gentle stretch in your low back. Hold for __________ seconds.  Tense your stomach muscles to support your low back as you bring your knees back to the starting position. Repeat the exercise to the other side. Repeat __________ times. Complete this exercise __________ times per day  EXTENSION RANGE OF MOTION AND FLEXIBILITY EXERCISES: STRETCH - Extension, Prone on Elbows  Lie on your stomach on the floor, a bed will be too soft. Place your palms about shoulder width apart and at the height of your head.  Place your elbows under your shoulders. If this is too painful, stack pillows under your chest.  Allow your body to relax so that your hips drop lower and make contact more completely with the floor.  Hold this position for __________ seconds.  Slowly return to lying flat on the floor. Repeat __________ times. Complete this exercise __________ times per day.  RANGE OF MOTION - Extension, Prone Press Ups  Lie on your stomach on the floor, a bed will be too soft. Place your palms about shoulder width apart and at the height of your head.  Keeping your back as relaxed as possible, slowly straighten your elbows while keeping your hips on the floor. You may adjust the placement of your hands to maximize your comfort. As you gain motion, your hands will come more underneath your shoulders.  Hold this position __________ seconds.  Slowly return to lying flat on the floor. Repeat __________ times. Complete this exercise __________ times per day.  STRENGTHENING EXERCISES - Sciatica  These exercises may help you when beginning to rehabilitate your injury. These exercises should be done near your "sweet spot." This is the neutral, low-back arch, somewhere between fully rounded and fully arched, that is your least painful position. When performed in this safe range of motion, these exercises can be  used for people who have either a flexion or extension based injury. These exercises may resolve your symptoms with or without further involvement from your physician, physical therapist or athletic trainer. While completing these exercises, remember:   Muscles can gain both the endurance and the strength needed for everyday activities through controlled exercises.  Complete these exercises as instructed by your physician, physical therapist or athletic trainer. Progress with the resistance and repetition exercises only as your caregiver advises.  You may experience muscle soreness or fatigue, but the pain or discomfort you are trying to eliminate should never worsen during these exercises. If this pain does worsen, stop and make certain you are following the directions exactly. If the pain is  still present after adjustments, discontinue the exercise until you can discuss the trouble with your clinician. STRENGTHENING - Deep Abdominals, Pelvic Tilt   Lie on a firm bed or floor. Keeping your legs in front of you, bend your knees so they are both pointed toward the ceiling and your feet are flat on the floor.  Tense your lower abdominal muscles to press your low back into the floor. This motion will rotate your pelvis so that your tail bone is scooping upwards rather than pointing at your feet or into the floor.  With a gentle tension and even breathing, hold this position for __________ seconds. Repeat __________ times. Complete this exercise __________ times per day.  STRENGTHENING - Abdominals, Crunches   Lie on a firm bed or floor. Keeping your legs in front of you, bend your knees so they are both pointed toward the ceiling and your feet are flat on the floor. Cross your arms over your chest.  Slightly tip your chin down without bending your neck.  Tense your abdominals and slowly lift your trunk high enough to just clear your shoulder blades. Lifting higher can put excessive stress on the  low back and does not further strengthen your abdominal muscles.  Control your return to the starting position. Repeat __________ times. Complete this exercise __________ times per day.  STRENGTHENING - Quadruped, Opposite UE/LE Lift  Assume a hands and knees position on a firm surface. Keep your hands under your shoulders and your knees under your hips. You may place padding under your knees for comfort.  Find your neutral spine and gently tense your abdominal muscles so that you can maintain this position. Your shoulders and hips should form a rectangle that is parallel with the floor and is not twisted.  Keeping your trunk steady, lift your right hand no higher than your shoulder and then your left leg no higher than your hip. Make sure you are not holding your breath. Hold this position __________ seconds.  Continuing to keep your abdominal muscles tense and your back steady, slowly return to your starting position. Repeat with the opposite arm and leg. Repeat __________ times. Complete this exercise __________ times per day.  STRENGTHENING - Abdominals and Quadriceps, Straight Leg Raise   Lie on a firm bed or floor with both legs extended in front of you.  Keeping one leg in contact with the floor, bend the other knee so that your foot can rest flat on the floor.  Find your neutral spine, and tense your abdominal muscles to maintain your spinal position throughout the exercise.  Slowly lift your straight leg off the floor about 6 inches for a count of 15, making sure to not hold your breath.  Still keeping your neutral spine, slowly lower your leg all the way to the floor. Repeat this exercise with each leg __________ times. Complete this exercise __________ times per day. POSTURE AND BODY MECHANICS CONSIDERATIONS - Sciatica Keeping correct posture when sitting, standing or completing your activities will reduce the stress put on different body tissues, allowing injured tissues a  chance to heal and limiting painful experiences. The following are general guidelines for improved posture. Your physician or physical therapist will provide you with any instructions specific to your needs. While reading these guidelines, remember:  The exercises prescribed by your provider will help you have the flexibility and strength to maintain correct postures.  The correct posture provides the optimal environment for your joints to work. All of your joints  have less wear and tear when properly supported by a spine with good posture. This means you will experience a healthier, less painful body.  Correct posture must be practiced with all of your activities, especially prolonged sitting and standing. Correct posture is as important when doing repetitive low-stress activities (typing) as it is when doing a single heavy-load activity (lifting). RESTING POSITIONS Consider which positions are most painful for you when choosing a resting position. If you have pain with flexion-based activities (sitting, bending, stooping, squatting), choose a position that allows you to rest in a less flexed posture. You would want to avoid curling into a fetal position on your side. If your pain worsens with extension-based activities (prolonged standing, working overhead), avoid resting in an extended position such as sleeping on your stomach. Most people will find more comfort when they rest with their spine in a more neutral position, neither too rounded nor too arched. Lying on a non-sagging bed on your side with a pillow between your knees, or on your back with a pillow under your knees will often provide some relief. Keep in mind, being in any one position for a prolonged period of time, no matter how correct your posture, can still lead to stiffness. PROPER SITTING POSTURE In order to minimize stress and discomfort on your spine, you must sit with correct posture Sitting with good posture should be effortless for  a healthy body. Returning to good posture is a gradual process. Many people can work toward this most comfortably by using various supports until they have the flexibility and strength to maintain this posture on their own. When sitting with proper posture, your ears will fall over your shoulders and your shoulders will fall over your hips. You should use the back of the chair to support your upper back. Your low back will be in a neutral position, just slightly arched. You may place a small pillow or folded towel at the base of your low back for support.  When working at a desk, create an environment that supports good, upright posture. Without extra support, muscles fatigue and lead to excessive strain on joints and other tissues. Keep these recommendations in mind: CHAIR:   A chair should be able to slide under your desk when your back makes contact with the back of the chair. This allows you to work closely.  The chair's height should allow your eyes to be level with the upper part of your monitor and your hands to be slightly lower than your elbows. BODY POSITION  Your feet should make contact with the floor. If this is not possible, use a foot rest.  Keep your ears over your shoulders. This will reduce stress on your neck and low back. INCORRECT SITTING POSTURES   If you are feeling tired and unable to assume a healthy sitting posture, do not slouch or slump. This puts excessive strain on your back tissues, causing more damage and pain. Healthier options include:  Using more support, like a lumbar pillow.  Switching tasks to something that requires you to be upright or walking.  Talking a brief walk.  Lying down to rest in a neutral-spine position. PROLONGED STANDING WHILE SLIGHTLY LEANING FORWARD  When completing a task that requires you to lean forward while standing in one place for a long time, place either foot up on a stationary 2-4 inch high object to help maintain the best  posture. When both feet are on the ground, the low back tends to  lose its slight inward curve. If this curve flattens (or becomes too large), then the back and your other joints will experience too much stress, fatigue more quickly and can cause pain.  CORRECT STANDING POSTURES Proper standing posture should be assumed with all daily activities, even if they only take a few moments, like when brushing your teeth. As in sitting, your ears should fall over your shoulders and your shoulders should fall over your hips. You should keep a slight tension in your abdominal muscles to brace your spine. Your tailbone should point down to the ground, not behind your body, resulting in an over-extended swayback posture.  INCORRECT STANDING POSTURES  Common incorrect standing postures include a forward head, locked knees and/or an excessive swayback. WALKING Walk with an upright posture. Your ears, shoulders and hips should all line-up. PROLONGED ACTIVITY IN A FLEXED POSITION When completing a task that requires you to bend forward at your waist or lean over a low surface, try to find a way to stabilize 3 of 4 of your limbs. You can place a hand or elbow on your thigh or rest a knee on the surface you are reaching across. This will provide you more stability so that your muscles do not fatigue as quickly. By keeping your knees relaxed, or slightly bent, you will also reduce stress across your low back. CORRECT LIFTING TECHNIQUES DO :   Assume a wide stance. This will provide you more stability and the opportunity to get as close as possible to the object which you are lifting.  Tense your abdominals to brace your spine; then bend at the knees and hips. Keeping your back locked in a neutral-spine position, lift using your leg muscles. Lift with your legs, keeping your back straight.  Test the weight of unknown objects before attempting to lift them.  Try to keep your elbows locked down at your sides in order get  the best strength from your shoulders when carrying an object.  Always ask for help when lifting heavy or awkward objects. INCORRECT LIFTING TECHNIQUES DO NOT:   Lock your knees when lifting, even if it is a small object.  Bend and twist. Pivot at your feet or move your feet when needing to change directions.  Assume that you cannot safely pick up a paperclip without proper posture. Document Released: 12/12/2005 Document Revised: 04/28/2014 Document Reviewed: 03/26/2009 [facility] Patient Information 2015 [place], [place]. This information is not intended to replace advice given to you by your health care provider. Make sure you discuss any questions you have with your health care provider.

## 2015-09-11 NOTE — Progress Notes (Signed)
Called patient to schedule pre-op appointment and patient states he wants to finish medications on Sunday , has an appointment to come back to see Dr. Tonita Cong on Tuesday 09/15/2015 to see if medication helped him. Offerred to schedule a pre-op appointment for Tuesday am but patient and wife did not want to make an appointment as wife states he has not been approved for surgery by [employer]. Patient refused any pre-op appointments until visit with Dr.  Tonita Cong.

## 2015-09-14 ENCOUNTER — Ambulatory Visit: Payer: Self-pay | Admitting: Orthopedic Surgery

## 2015-09-14 ENCOUNTER — Other Ambulatory Visit (HOSPITAL_COMMUNITY): Payer: Self-pay | Admitting: *Deleted

## 2015-09-14 NOTE — H&P (Signed)
Nicholas Stevens is an 44 y.o. male.   Chief Complaint: back and R leg pain, weakness HPI: The patient is a 45 year old male who presents today for follow up of their back. The patient is being followed for their back pain. They are now 2 weeks and 4 days out from when symptoms began. Symptoms reported today include: pain, numbness (right foot) and leg pain (right). The patient states that they are doing 30 percent better. Current treatment includes: activity modification, pain medications and Prednisone and use of ice. The following medication has been used for pain control: Percocet. The patient reports their current pain level to be moderate. The patient presents today following MRI. Note for "Follow-up back": The patient is out of work.  Rashied Shipes follows up with his wife. He is two days from finishing his Sterapred dosepak. He still reports no changes in his numbness in the foot, weakness, less pain. He is taking Percocet, which is better than the Norco. He has been out of work.  REVIEW OF SYSTEMS Review of systems is negative for fevers, chest pain, shortness of breath, unexplained recent weight loss, loss of bowel or bladder function, burning with urination, joint swelling, rashes, weakness or numbness, difficulty with balance, easy bruising, excessive thirst or frequent urination.  Past Medical History  Diagnosis Date  . OSA (obstructive sleep apnea)   . Overweight(278.02)   . Liver hemangioma   . DDD (degenerative disc disease), lumbar   . Tinea barbae   . Morbid obesity     Past Surgical History  Procedure Laterality Date  . Rotator cuff repair Right 2005    Dr Sharol Given  . Laparoscopic appendectomy  2007    Dr Marlou Starks  . Breath tek h pylori N/A 08/20/2013    Procedure: BREATH TEK H PYLORI;  Surgeon: Pedro Earls, MD;  Location: [facility];  Service: General;  Laterality: N/A;  . Laparoscopic gastric sleeve resection N/A 10/08/2013    Procedure: LAPAROSCOPIC SLEEVE  GASTRECTOMY;  Surgeon: Pedro Earls, MD;  Location: [facility];  Service: General;  Laterality: N/A;    Family History  Problem Relation Age of Onset  . Liver disease Father     ETOH, Hep C  . Hypertension Father   . Other Mother     hx of DJD   Social History:  reports that he has never smoked. He has never used smokeless tobacco. He reports that he does not drink alcohol or use illicit drugs.  Allergies: No Known Allergies   (Not in a hospital admission)  No results found for this or any previous visit (from the past 48 hour(s)). No results found.  Review of Systems  Constitutional: Negative.   HENT: Negative.   Eyes: Negative.   Respiratory: Negative.   Cardiovascular: Negative.   Gastrointestinal: Negative.   Genitourinary: Negative.   Musculoskeletal: Positive for back pain.  Skin: Negative.   Neurological: Positive for sensory change and focal weakness.    There were no vitals taken for this visit. Physical Exam  Constitutional: He is oriented to person, place, and time. He appears well-developed. He appears distressed.  HENT:  Head: Normocephalic.  Eyes: Pupils are equal, round, and reactive to light.  Neck: Normal range of motion.  Cardiovascular: Normal rate.   Respiratory: Effort normal.  GI: Soft.  Musculoskeletal:  Moderate to severe distress, walks with an antalgic gait. Mood and affect is anxious. Tender on the right proximal sciatic notch. His straight leg raise produces buttock,  thigh and calf pain on the right, exacerbated with dorsal augmentation maneuver. Negative on the left. He has decreased sensation. He has significant numbness in the S1 dermatome, particularly in the lateral aspect of the foot and the heel. He has an absent Achilles reflex on the left, 2+ on the right, 1+ in the knees bilaterally. His EHL is 3+/5 on the right compared to the left. His plantar flexion is 4/5 on the right compared to the left as well. His abductor strength is 5-/5 on  the left compared to the right. No instability to hips, knees and ankles. Pulses are intact. No flank pain with percussion. Abdomen is soft and nontender  Neurological: He is alert and oriented to person, place, and time.    MRI demonstrates a large extruded fragment at L5-S1, compressing the S1 nerve root. There is some disc degeneration noted at L5-S1, no pars defect.  Assessment/Plan HNP L5-S1 S1 radiculopathy with myotomal weakness, S1 dermatomal dysesthesias secondary to large disc herniation at L5-S1 compressing the S1 nerve root.  Given the persistence of his symptoms with the presence of profound neurologic deficit, a large neurocompressive lesion noted by MRI, I discussed with Mr. Liebman it would be wise to consider and proceed with lumbar decompression at this point in time given that he has been refractory to rest, activity modification, home exercise and prednisone pack.  I had an extensive discussion of the risks and benefits of the lumbar decompression with the patient including bleeding, infection, damage to neurovascular structures, epidural fibrosis, CSF leak requiring repair. We also discussed increase in pain, adjacent segment disease, recurrent disc herniation, need for future surgery including repeat decompression and/or fusion. We also discussed risks of postoperative hematoma, paralysis, anesthetic complications including DVT, PE, death, cardiopulmonary dysfunction. In addition, the perioperative and postoperative courses were discussed in detail including the rehabilitative time and return to functional activity and work. I provided the patient with an illustrated handout and utilized the appropriate surgical models.  I would like to reexamine him on Monday or Tuesday. We will proceed on scheduling him on Wednesday or Thursday. If by chance, he has a remarkable recovery after final completion of his Dosepak and rest, we can consider nonsurgical option; however, again, given the  persistence of his neurologic deficit, I would recommend urgent decompression. We discussed with he and his wife in extensive detail and provided them with handout. If he has any changes in the interim, he is to call. He has no bowel or bladder dysfunction at this point in time.  Plan microlumbar decompression L5-S1  Lacie Draft PA-C for Dr. Tonita Cong  09/14/2015, 8:57 AM

## 2015-09-14 NOTE — Patient Instructions (Addendum)
Makael MONTEY EBEL  09/14/2015   Your procedure is scheduled on: Wednesday September 21st, 2016  Report to [facility]  Entrance take [place]  elevators to 3rd floor to  [place] at  900 AM.  Call this number if you have problems the morning of surgery [phone]   Remember: ONLY 1 PERSON MAY GO WITH YOU TO SHORT STAY TO GET  READY MORNING OF [place].  Do not eat food or drink liquids :After Midnight.     Take these medicines the morning of surgery with A SIP OF WATER: oxyccodone if needed                               You may not have any metal on your body including hair pins and              piercings  Do not wear jewelry, make-up, lotions, powders or perfumes, deodorant             Do not wear nail polish.  Do not shave  48 hours prior to surgery.              Men may shave face and neck.   Do not bring valuables to the hospital. [place].  Contacts, dentures or bridgework may not be worn into surgery.  Leave suitcase in the car. After surgery it may be brought to your room.     Patients discharged the day of surgery will not be allowed to drive home.  Name and phone number of your driver:  Special Instructions: N/A              Please read over the following fact sheets you were given: _____________________________________________________________________             [facility] - Preparing for Surgery Before surgery, you can play an important role.  Because skin is not sterile, your skin needs to be as free of germs as possible.  You can reduce the number of germs on your skin by washing with CHG (chlorahexidine gluconate) soap before surgery.  CHG is an antiseptic cleaner which kills germs and bonds with the skin to continue killing germs even after washing. Please DO NOT use if you have an allergy to CHG or antibacterial soaps.  If your skin becomes reddened/irritated stop using the  CHG and inform your nurse when you arrive at Short Stay. Do not shave (including legs and underarms) for at least 48 hours prior to the first CHG shower.  You may shave your face/neck. Please follow these instructions carefully:  1.  Shower with CHG Soap the night before surgery and the  morning of Surgery.  2.  If you choose to wash your hair, wash your hair first as usual with your  normal  shampoo.  3.  After you shampoo, rinse your hair and body thoroughly to remove the  shampoo.                           4.  Use CHG as you would any other liquid soap.  You can apply chg directly  to the skin and wash  Gently with a scrungie or clean washcloth.  5.  Apply the CHG Soap to your body ONLY FROM THE NECK DOWN.   Do not use on face/ open                           Wound or open sores. Avoid contact with eyes, ears mouth and genitals (private parts).                       Wash face,  Genitals (private parts) with your normal soap.             6.  Wash thoroughly, paying special attention to the area where your surgery  will be performed.  7.  Thoroughly rinse your body with warm water from the neck down.  8.  DO NOT shower/wash with your normal soap after using and rinsing off  the CHG Soap.                9.  Pat yourself dry with a clean towel.            10.  Wear clean pajamas.            11.  Place clean sheets on your bed the night of your first shower and do not  sleep with pets. Day of Surgery : Do not apply any lotions/deodorants the morning of surgery.  Please wear clean clothes to the hospital/surgery center.  FAILURE TO FOLLOW THESE INSTRUCTIONS MAY RESULT IN THE CANCELLATION OF YOUR SURGERY PATIENT SIGNATURE_________________________________  NURSE SIGNATURE__________________________________  ________________________________________________________________________   Adam [place]ix  An incentive spirometer is a tool that can help keep your lungs clear  and active. This tool measures how well you are filling your lungs with each breath. Taking long deep breaths may help reverse or decrease the chance of developing breathing (pulmonary) problems (especially infection) following:  A long period of time when you are unable to move or be active. BEFORE THE PROCEDURE   If the spirometer includes an indicator to show your best effort, your nurse or respiratory therapist will set it to a desired goal.  If possible, sit up straight or lean slightly forward. Try not to slouch.  Hold the incentive spirometer in an upright position. INSTRUCTIONS FOR USE  1. Sit on the edge of your bed if possible, or sit up as far as you can in bed or on a chair. 2. Hold the incentive spirometer in an upright position. 3. Breathe out normally. 4. Place the mouthpiece in your mouth and seal your lips tightly around it. 5. Breathe in slowly and as deeply as possible, raising the piston or the ball toward the top of the column. 6. Hold your breath for 3-5 seconds or for as long as possible. Allow the piston or ball to fall to the bottom of the column. 7. Remove the mouthpiece from your mouth and breathe out normally. 8. Rest for a few seconds and repeat Steps 1 through 7 at least 10 times every 1-2 hours when you are awake. Take your time and take a few normal breaths between deep breaths. 9. The spirometer may include an indicator to show your best effort. Use the indicator as a goal to work toward during each repetition. 10. After each set of 10 deep breaths, practice coughing to be sure your lungs are clear. If you have an incision (the cut made at the time of surgery),  support your incision when coughing by placing a pillow or rolled up towels firmly against it. Once you are able to get out of bed, walk around indoors and cough well. You may stop using the incentive spirometer when instructed by your caregiver.  RISKS AND COMPLICATIONS  Take your time so you do not  get dizzy or light-headed.  If you are in pain, you may need to take or ask for pain medication before doing incentive spirometry. It is harder to take a deep breath if you are having pain. AFTER USE  Rest and breathe slowly and easily.  It can be helpful to keep track of a log of your progress. Your caregiver can provide you with a simple table to help with this. If you are using the spirometer at home, follow these instructions: [place] IF:   You are having difficultly using the spirometer.  You have trouble using the spirometer as often as instructed.  Your pain medication is not giving enough relief while using the spirometer.  You develop fever of 100.5 F (38.1 C) or higher. SEEK IMMEDIATE MEDICAL CARE IF:   You cough up bloody sputum that had not been present before.  You develop fever of 102 F (38.9 C) or greater.  You develop worsening pain at or near the incision site. MAKE SURE YOU:   Understand these instructions.  Will watch your condition.  Will get help right away if you are not doing well or get worse. Document Released: 04/24/2007 Document Revised: 03/05/2012 Document Reviewed: 06/25/2007 [facility] Patient Information 2014 [place], [place].   ________________________________________________________________________

## 2015-09-15 ENCOUNTER — Encounter (HOSPITAL_COMMUNITY)
Admission: RE | Admit: 2015-09-15 | Discharge: 2015-09-15 | Disposition: A | Discharge status: Home / Self Care | Payer: Commercial Managed Care - HMO | Source: Ambulatory Visit | Attending: Specialist | Admitting: Specialist

## 2015-09-15 ENCOUNTER — Other Ambulatory Visit (HOSPITAL_COMMUNITY): Payer: Self-pay

## 2015-09-15 ENCOUNTER — Encounter (HOSPITAL_COMMUNITY): Payer: Self-pay

## 2015-09-15 ENCOUNTER — Ambulatory Visit (HOSPITAL_COMMUNITY)
Admission: RE | Admit: 2015-09-15 | Discharge: 2015-09-15 | Disposition: A | Discharge status: Home / Self Care | Payer: Commercial Managed Care - HMO | Source: Ambulatory Visit | Attending: Orthopedic Surgery | Admitting: Orthopedic Surgery

## 2015-09-15 DIAGNOSIS — M5126 Other intervertebral disc displacement, lumbar region: Secondary | ICD-10-CM | POA: Insufficient documentation

## 2015-09-15 DIAGNOSIS — I10 Essential (primary) hypertension: Secondary | ICD-10-CM | POA: Insufficient documentation

## 2015-09-15 DIAGNOSIS — Q7649 Other congenital malformations of spine, not associated with scoliosis: Secondary | ICD-10-CM | POA: Insufficient documentation

## 2015-09-15 DIAGNOSIS — R9431 Abnormal electrocardiogram [ECG] [EKG]: Secondary | ICD-10-CM | POA: Insufficient documentation

## 2015-09-15 DIAGNOSIS — Z01812 Encounter for preprocedural laboratory examination: Secondary | ICD-10-CM | POA: Diagnosis not present

## 2015-09-15 DIAGNOSIS — Z01818 Encounter for other preprocedural examination: Secondary | ICD-10-CM | POA: Insufficient documentation

## 2015-09-15 HISTORY — DX: Essential (primary) hypertension: I10

## 2015-09-15 HISTORY — DX: Plantar fascial fibromatosis: M72.2

## 2015-09-15 HISTORY — DX: Reserved for concepts with insufficient information to code with codable children: IMO0002

## 2015-09-15 LAB — SURGICAL PCR SCREEN
MRSA, PCR: NEGATIVE
Staphylococcus aureus: NEGATIVE

## 2015-09-15 LAB — BASIC METABOLIC PANEL
Anion gap: 9 (ref 5–15)
BUN: 11 mg/dL (ref 6–20)
CHLORIDE: 102 mmol/L (ref 101–111)
CO2: 28 mmol/L (ref 22–32)
Calcium: 10 mg/dL (ref 8.9–10.3)
Creatinine, Ser: 0.8 mg/dL (ref 0.61–1.24)
GFR calc non Af Amer: >60 mL/min (ref >60)
Glucose, Bld: 99 mg/dL (ref 65–99)
Potassium: 3.7 mmol/L (ref 3.5–5.1)
Sodium: 139 mmol/L (ref 135–145)

## 2015-09-15 LAB — CBC
HCT: 47.7 % (ref 39.0–52.0)
Hemoglobin: 15.9 g/dL (ref 13.0–17.0)
MCH: 28.3 pg (ref 26.0–34.0)
MCHC: 33.3 g/dL (ref 30.0–36.0)
MCV: 84.9 fL (ref 78.0–100.0)
Platelets: 269 10*3/uL (ref 150–400)
RBC: 5.62 MIL/uL (ref 4.22–5.81)
RDW: 13.7 % (ref 11.5–15.5)
WBC: 5.9 10*3/uL (ref 4.0–10.5)

## 2015-09-15 MED ORDER — DEXTROSE 5 % IV SOLN
3.0000 g | INTRAVENOUS | Status: AC
  Administered 2015-09-16: 3 g via INTRAVENOUS
  Filled 2015-09-15 (×2): qty 3000

## 2015-09-16 ENCOUNTER — Ambulatory Visit (HOSPITAL_COMMUNITY): Payer: 59 | Admitting: Certified Registered Nurse Anesthetist

## 2015-09-16 ENCOUNTER — Encounter (HOSPITAL_COMMUNITY): Payer: Self-pay

## 2015-09-16 ENCOUNTER — Ambulatory Visit (HOSPITAL_COMMUNITY): Payer: 59

## 2015-09-16 ENCOUNTER — Encounter (HOSPITAL_COMMUNITY)
Admission: RE | Disposition: A | Discharge status: Home / Self Care | Payer: Self-pay | Source: Ambulatory Visit | Attending: Specialist

## 2015-09-16 ENCOUNTER — Ambulatory Visit (HOSPITAL_COMMUNITY)
Admission: RE | Admit: 2015-09-16 | Discharge: 2015-09-17 | Disposition: A | Discharge status: Home / Self Care | Payer: 59 | Source: Ambulatory Visit | Attending: Specialist | Admitting: Specialist

## 2015-09-16 DIAGNOSIS — M1990 Unspecified osteoarthritis, unspecified site: Secondary | ICD-10-CM | POA: Insufficient documentation

## 2015-09-16 DIAGNOSIS — G4733 Obstructive sleep apnea (adult) (pediatric): Secondary | ICD-10-CM | POA: Insufficient documentation

## 2015-09-16 DIAGNOSIS — M4806 Spinal stenosis, lumbar region: Secondary | ICD-10-CM | POA: Diagnosis not present

## 2015-09-16 DIAGNOSIS — X58XXXA Exposure to other specified factors, initial encounter: Secondary | ICD-10-CM | POA: Diagnosis not present

## 2015-09-16 DIAGNOSIS — Z6835 Body mass index (BMI) 35.0-35.9, adult: Secondary | ICD-10-CM | POA: Diagnosis not present

## 2015-09-16 DIAGNOSIS — M5136 Other intervertebral disc degeneration, lumbar region: Secondary | ICD-10-CM | POA: Diagnosis not present

## 2015-09-16 DIAGNOSIS — Y9389 Activity, other specified: Secondary | ICD-10-CM | POA: Insufficient documentation

## 2015-09-16 DIAGNOSIS — I10 Essential (primary) hypertension: Secondary | ICD-10-CM | POA: Diagnosis not present

## 2015-09-16 DIAGNOSIS — M549 Dorsalgia, unspecified: Secondary | ICD-10-CM

## 2015-09-16 DIAGNOSIS — M5126 Other intervertebral disc displacement, lumbar region: Secondary | ICD-10-CM | POA: Insufficient documentation

## 2015-09-16 DIAGNOSIS — Y9289 Other specified places as the place of occurrence of the external cause: Secondary | ICD-10-CM | POA: Diagnosis not present

## 2015-09-16 DIAGNOSIS — M48061 Spinal stenosis, lumbar region without neurogenic claudication: Secondary | ICD-10-CM | POA: Diagnosis present

## 2015-09-16 DIAGNOSIS — Y990 Civilian activity done for income or pay: Secondary | ICD-10-CM | POA: Diagnosis not present

## 2015-09-16 HISTORY — PX: LUMBAR LAMINECTOMY/DECOMPRESSION MICRODISCECTOMY: SHX5026

## 2015-09-16 SURGERY — LUMBAR LAMINECTOMY/DECOMPRESSION MICRODISCECTOMY
Anesthesia: General | Site: Back

## 2015-09-16 MED ORDER — HYDROMORPHONE HCL 1 MG/ML IJ SOLN: 0.5000 mg | INTRAMUSCULAR | Status: DC | PRN

## 2015-09-16 MED ORDER — LACTATED RINGERS IV SOLN
INTRAVENOUS | Status: DC
  Administered 2015-09-16 (×2): via INTRAVENOUS
  Administered 2015-09-16: 1000 mL via INTRAVENOUS

## 2015-09-16 MED ORDER — ROCURONIUM BROMIDE 100 MG/10ML IV SOLN
INTRAVENOUS | Status: AC
  Filled 2015-09-16: qty 1

## 2015-09-16 MED ORDER — HYDROMORPHONE HCL 1 MG/ML IJ SOLN
INTRAMUSCULAR | Status: AC
  Filled 2015-09-16: qty 1

## 2015-09-16 MED ORDER — METHOCARBAMOL 500 MG PO TABS
500.0000 mg | ORAL_TABLET | Freq: Four times a day (QID) | ORAL | Status: DC | PRN
  Administered 2015-09-16 – 2015-09-17 (×2): 500 mg via ORAL
  Filled 2015-09-16 (×2): qty 1

## 2015-09-16 MED ORDER — FENTANYL CITRATE (PF) 100 MCG/2ML IJ SOLN
INTRAMUSCULAR | Status: AC
  Filled 2015-09-16: qty 4

## 2015-09-16 MED ORDER — OXYCODONE-ACETAMINOPHEN 5-325 MG PO TABS: 1.0000 | ORAL_TABLET | ORAL | Status: DC | PRN

## 2015-09-16 MED ORDER — BUPIVACAINE-EPINEPHRINE (PF) 0.5% -1:200000 IJ SOLN
INTRAMUSCULAR | Status: AC
  Filled 2015-09-16: qty 30

## 2015-09-16 MED ORDER — NEOSTIGMINE METHYLSULFATE 10 MG/10ML IV SOLN
INTRAVENOUS | Status: AC
  Filled 2015-09-16: qty 1

## 2015-09-16 MED ORDER — ONDANSETRON HCL 4 MG/2ML IJ SOLN
4.0000 mg | INTRAMUSCULAR | Status: DC | PRN
Start: 2015-09-16 — End: 2015-09-17

## 2015-09-16 MED ORDER — BUPIVACAINE-EPINEPHRINE 0.5% -1:200000 IJ SOLN
INTRAMUSCULAR | Status: DC | PRN
  Administered 2015-09-16: 14 mL

## 2015-09-16 MED ORDER — PROPOFOL 10 MG/ML IV BOLUS
INTRAVENOUS | Status: DC | PRN
  Administered 2015-09-16: 200 mg via INTRAVENOUS
  Administered 2015-09-16 (×2): 50 mg via INTRAVENOUS

## 2015-09-16 MED ORDER — RISAQUAD PO CAPS
1.0000 | ORAL_CAPSULE | Freq: Every day | ORAL | Status: DC
  Administered 2015-09-16 – 2015-09-17 (×2): 1 via ORAL
  Filled 2015-09-16 (×2): qty 1

## 2015-09-16 MED ORDER — MIDAZOLAM HCL 2 MG/2ML IJ SOLN
INTRAMUSCULAR | Status: AC
  Filled 2015-09-16: qty 4

## 2015-09-16 MED ORDER — THROMBIN 5000 UNITS EX SOLR
OROMUCOSAL | Status: DC | PRN
  Administered 2015-09-16: 13:00:00 via TOPICAL

## 2015-09-16 MED ORDER — OXYCODONE HCL 5 MG/5ML PO SOLN: 5.0000 mg | Freq: Once | ORAL | Status: DC | PRN

## 2015-09-16 MED ORDER — METHOCARBAMOL 1000 MG/10ML IJ SOLN
500.0000 mg | Freq: Four times a day (QID) | INTRAVENOUS | Status: DC | PRN
  Administered 2015-09-16: 500 mg via INTRAVENOUS
  Filled 2015-09-16 (×2): qty 5

## 2015-09-16 MED ORDER — FENTANYL CITRATE (PF) 100 MCG/2ML IJ SOLN
INTRAMUSCULAR | Status: DC | PRN
  Administered 2015-09-16 (×6): 50 ug via INTRAVENOUS

## 2015-09-16 MED ORDER — EPHEDRINE SULFATE 50 MG/ML IJ SOLN
INTRAMUSCULAR | Status: AC
  Filled 2015-09-16: qty 1

## 2015-09-16 MED ORDER — SENNOSIDES-DOCUSATE SODIUM 8.6-50 MG PO TABS: 1.0000 | ORAL_TABLET | Freq: Every evening | ORAL | Status: DC | PRN

## 2015-09-16 MED ORDER — DEXAMETHASONE SODIUM PHOSPHATE 10 MG/ML IJ SOLN
INTRAMUSCULAR | Status: AC
  Filled 2015-09-16: qty 1

## 2015-09-16 MED ORDER — ACETAMINOPHEN 650 MG RE SUPP: 650.0000 mg | RECTAL | Status: DC | PRN

## 2015-09-16 MED ORDER — CEFAZOLIN SODIUM-DEXTROSE 2-3 GM-% IV SOLR
2.0000 g | Freq: Three times a day (TID) | INTRAVENOUS | Status: AC
  Administered 2015-09-16 – 2015-09-17 (×3): 2 g via INTRAVENOUS
  Filled 2015-09-16 (×3): qty 50

## 2015-09-16 MED ORDER — DEXAMETHASONE SODIUM PHOSPHATE 10 MG/ML IJ SOLN
INTRAMUSCULAR | Status: DC | PRN
Start: 2015-09-16 — End: 2015-09-16
  Administered 2015-09-16: 10 mg via INTRAVENOUS

## 2015-09-16 MED ORDER — PROPOFOL 10 MG/ML IV BOLUS
INTRAVENOUS | Status: AC
  Filled 2015-09-16: qty 20

## 2015-09-16 MED ORDER — NEOSTIGMINE METHYLSULFATE 10 MG/10ML IV SOLN
INTRAVENOUS | Status: DC | PRN
  Administered 2015-09-16: 4 mg via INTRAVENOUS

## 2015-09-16 MED ORDER — LACTATED RINGERS IV SOLN: INTRAVENOUS | Status: DC

## 2015-09-16 MED ORDER — ONDANSETRON HCL 4 MG/2ML IJ SOLN
INTRAMUSCULAR | Status: DC | PRN
  Administered 2015-09-16: 4 mg via INTRAVENOUS

## 2015-09-16 MED ORDER — DOCUSATE SODIUM 100 MG PO CAPS
100.0000 mg | ORAL_CAPSULE | Freq: Two times a day (BID) | ORAL | Status: DC
  Administered 2015-09-16 – 2015-09-17 (×2): 100 mg via ORAL

## 2015-09-16 MED ORDER — SODIUM CHLORIDE 0.9 % IR SOLN
Status: DC | PRN
  Administered 2015-09-16: 500 mL

## 2015-09-16 MED ORDER — SODIUM CHLORIDE 0.9 % IR SOLN
Status: AC
  Filled 2015-09-16: qty 1

## 2015-09-16 MED ORDER — GLYCOPYRROLATE 0.2 MG/ML IJ SOLN
INTRAMUSCULAR | Status: AC
  Filled 2015-09-16: qty 3

## 2015-09-16 MED ORDER — EPHEDRINE SULFATE 50 MG/ML IJ SOLN
INTRAMUSCULAR | Status: DC | PRN
  Administered 2015-09-16 (×5): 10 mg via INTRAVENOUS

## 2015-09-16 MED ORDER — ACETAMINOPHEN 325 MG PO TABS: 650.0000 mg | ORAL_TABLET | ORAL | Status: DC | PRN

## 2015-09-16 MED ORDER — ONDANSETRON HCL 4 MG/2ML IJ SOLN
INTRAMUSCULAR | Status: AC
  Filled 2015-09-16: qty 2

## 2015-09-16 MED ORDER — BISACODYL 5 MG PO TBEC: 5.0000 mg | DELAYED_RELEASE_TABLET | Freq: Every day | ORAL | Status: DC | PRN

## 2015-09-16 MED ORDER — MENTHOL 3 MG MT LOZG: 1.0000 | LOZENGE | OROMUCOSAL | Status: DC | PRN

## 2015-09-16 MED ORDER — SUCCINYLCHOLINE CHLORIDE 20 MG/ML IJ SOLN
INTRAMUSCULAR | Status: DC | PRN
  Administered 2015-09-16: 100 mg via INTRAVENOUS

## 2015-09-16 MED ORDER — ACETAMINOPHEN 160 MG/5ML PO SOLN: 325.0000 mg | ORAL | Status: DC | PRN

## 2015-09-16 MED ORDER — KCL IN DEXTROSE-NACL 20-5-0.45 MEQ/L-%-% IV SOLN
INTRAVENOUS | Status: DC
Start: 2015-09-16 — End: 2015-09-17
  Administered 2015-09-16: 17:00:00 via INTRAVENOUS
  Filled 2015-09-16 (×2): qty 1000

## 2015-09-16 MED ORDER — ALUM & MAG HYDROXIDE-SIMETH 200-200-20 MG/5ML PO SUSP: 30.0000 mL | Freq: Four times a day (QID) | ORAL | Status: DC | PRN

## 2015-09-16 MED ORDER — GLYCOPYRROLATE 0.2 MG/ML IJ SOLN
INTRAMUSCULAR | Status: DC | PRN
  Administered 2015-09-16: .5 mg via INTRAVENOUS

## 2015-09-16 MED ORDER — TRIAMTERENE-HCTZ 37.5-25 MG PO CAPS
1.0000 | ORAL_CAPSULE | Freq: Every morning | ORAL | Status: DC
  Administered 2015-09-17: 1 via ORAL
  Filled 2015-09-16: qty 1

## 2015-09-16 MED ORDER — ROCURONIUM BROMIDE 100 MG/10ML IV SOLN
INTRAVENOUS | Status: DC | PRN
  Administered 2015-09-16: 30 mg via INTRAVENOUS
  Administered 2015-09-16 (×3): 10 mg via INTRAVENOUS

## 2015-09-16 MED ORDER — HYDROMORPHONE HCL 1 MG/ML IJ SOLN
0.2500 mg | INTRAMUSCULAR | Status: DC | PRN
  Administered 2015-09-16 (×2): 0.5 mg via INTRAVENOUS

## 2015-09-16 MED ORDER — DOCUSATE SODIUM 100 MG PO CAPS: 100.0000 mg | ORAL_CAPSULE | Freq: Two times a day (BID) | ORAL | Status: DC | PRN

## 2015-09-16 MED ORDER — PHENYLEPHRINE HCL 10 MG/ML IJ SOLN
INTRAMUSCULAR | Status: DC | PRN
  Administered 2015-09-16: 40 ug via INTRAVENOUS
  Administered 2015-09-16: 120 ug via INTRAVENOUS
  Administered 2015-09-16: 40 ug via INTRAVENOUS
  Administered 2015-09-16 (×2): 80 ug via INTRAVENOUS

## 2015-09-16 MED ORDER — TRAZODONE HCL 50 MG PO TABS: 25.0000 mg | ORAL_TABLET | Freq: Every evening | ORAL | Status: DC | PRN

## 2015-09-16 MED ORDER — OXYCODONE HCL 5 MG PO TABS: 5.0000 mg | ORAL_TABLET | Freq: Once | ORAL | Status: DC | PRN

## 2015-09-16 MED ORDER — ACETAMINOPHEN 325 MG PO TABS
325.0000 mg | ORAL_TABLET | ORAL | Status: DC | PRN
Start: 2015-09-16 — End: 2015-09-16

## 2015-09-16 MED ORDER — METHOCARBAMOL 500 MG PO TABS: 500.0000 mg | ORAL_TABLET | Freq: Three times a day (TID) | ORAL | Status: DC

## 2015-09-16 MED ORDER — PHENYLEPHRINE 40 MCG/ML (10ML) SYRINGE FOR IV PUSH (FOR BLOOD PRESSURE SUPPORT)
PREFILLED_SYRINGE | INTRAVENOUS | Status: AC
  Filled 2015-09-16: qty 10

## 2015-09-16 MED ORDER — FENTANYL CITRATE (PF) 250 MCG/5ML IJ SOLN
INTRAMUSCULAR | Status: AC
  Filled 2015-09-16: qty 25

## 2015-09-16 MED ORDER — HYDROCODONE-ACETAMINOPHEN 5-325 MG PO TABS
1.0000 | ORAL_TABLET | ORAL | Status: DC | PRN
  Administered 2015-09-16 – 2015-09-17 (×5): 1 via ORAL
  Administered 2015-09-17: 2 via ORAL
  Filled 2015-09-16: qty 2
  Filled 2015-09-16 (×3): qty 1
  Filled 2015-09-16: qty 2

## 2015-09-16 MED ORDER — MIDAZOLAM HCL 5 MG/5ML IJ SOLN
INTRAMUSCULAR | Status: DC | PRN
  Administered 2015-09-16: 2 mg via INTRAVENOUS

## 2015-09-16 MED ORDER — THROMBIN 5000 UNITS EX SOLR
CUTANEOUS | Status: AC
  Filled 2015-09-16: qty 10000

## 2015-09-16 MED ORDER — PHENOL 1.4 % MT LIQD
1.0000 | OROMUCOSAL | Status: DC | PRN
  Filled 2015-09-16: qty 177

## 2015-09-16 SURGICAL SUPPLY — 44 items
BAG ZIPLOCK 12X15 (MISCELLANEOUS) IMPLANT
CLOSURE WOUND 1/2 X4 (GAUZE/BANDAGES/DRESSINGS) ×1
CLOTH 2% CHLOROHEXIDINE 3PK (PERSONAL CARE ITEMS) ×3 IMPLANT
DRAPE MICROSCOPE LEICA (MISCELLANEOUS) ×3 IMPLANT
DRAPE SHEET LG 3/4 BI-LAMINATE (DRAPES) ×3 IMPLANT
DRAPE SURG 17X11 SM STRL (DRAPES) ×3 IMPLANT
DRAPE UTILITY XL STRL (DRAPES) ×3 IMPLANT
DRSG AQUACEL AG ADV 3.5X 4 (GAUZE/BANDAGES/DRESSINGS) IMPLANT
DRSG AQUACEL AG ADV 3.5X 6 (GAUZE/BANDAGES/DRESSINGS) ×3 IMPLANT
DRSG TELFA 3X8 NADH (GAUZE/BANDAGES/DRESSINGS) ×3 IMPLANT
DURAPREP 26ML APPLICATOR (WOUND CARE) ×3 IMPLANT
DURASEAL SPINE SEALANT 3ML (MISCELLANEOUS) IMPLANT
ELECT BLADE TIP CTD 4 INCH (ELECTRODE) ×3 IMPLANT
ELECT REM PT RETURN 9FT ADLT (ELECTROSURGICAL) ×3
ELECTRODE REM PT RTRN 9FT ADLT (ELECTROSURGICAL) ×1 IMPLANT
GLOVE BIOGEL PI IND STRL 7.0 (GLOVE) ×1 IMPLANT
GLOVE BIOGEL PI INDICATOR 7.0 (GLOVE) ×2
GLOVE SURG SS PI 7.0 STRL IVOR (GLOVE) ×3 IMPLANT
GLOVE SURG SS PI 7.5 STRL IVOR (GLOVE) IMPLANT
GLOVE SURG SS PI 8.0 STRL IVOR (GLOVE) ×6 IMPLANT
GOWN STRL REUS W/TWL XL LVL3 (GOWN DISPOSABLE) ×6 IMPLANT
IV CATH 14GX2 1/4 (CATHETERS) ×3 IMPLANT
KIT BASIN OR (CUSTOM PROCEDURE TRAY) ×3 IMPLANT
KIT POSITIONING SURG ANDREWS (MISCELLANEOUS) ×3 IMPLANT
MANIFOLD NEPTUNE II (INSTRUMENTS) ×3 IMPLANT
NEEDLE SPNL 18GX3.5 QUINCKE PK (NEEDLE) ×6 IMPLANT
PACK LAMINECTOMY ORTHO (CUSTOM PROCEDURE TRAY) ×3 IMPLANT
PATTIES SURGICAL .5 X.5 (GAUZE/BANDAGES/DRESSINGS) IMPLANT
PATTIES SURGICAL .75X.75 (GAUZE/BANDAGES/DRESSINGS) ×3 IMPLANT
RUBBERBAND STERILE (MISCELLANEOUS) ×3 IMPLANT
SPONGE SURGIFOAM ABS GEL 100 (HEMOSTASIS) ×3 IMPLANT
STAPLER VISISTAT (STAPLE) IMPLANT
STRIP CLOSURE SKIN 1/2X4 (GAUZE/BANDAGES/DRESSINGS) ×2 IMPLANT
SUT NURALON 4 0 TR CR/8 (SUTURE) IMPLANT
SUT PROLENE 3 0 PS 2 (SUTURE) IMPLANT
SUT VIC AB 1 CT1 27 (SUTURE)
SUT VIC AB 1 CT1 27XBRD ANTBC (SUTURE) IMPLANT
SUT VIC AB 1-0 CT2 27 (SUTURE) ×3 IMPLANT
SUT VIC AB 2-0 CT1 27 (SUTURE)
SUT VIC AB 2-0 CT1 TAPERPNT 27 (SUTURE) IMPLANT
SUT VIC AB 2-0 CT2 27 (SUTURE) ×3 IMPLANT
SYR 3ML LL SCALE MARK (SYRINGE) ×3 IMPLANT
TOWEL OR 17X26 10 PK STRL BLUE (TOWEL DISPOSABLE) ×3 IMPLANT
YANKAUER SUCT BULB TIP NO VENT (SUCTIONS) ×3 IMPLANT

## 2015-09-16 NOTE — Brief Op Note (Addendum)
09/16/2015  1:05 PM  PATIENT:  Nicholas Stevens  44 y.o. male  PRE-OPERATIVE DIAGNOSIS:  HNP L5 - S1  POST-OPERATIVE DIAGNOSIS:  HNP L5 - S1  PROCEDURE:  Procedure(s): MICRO LUMBAR DECOMPRESSION, MICRODISCECTOMY L5 - S1 (N/A)  SURGEON:  Surgeon(s) and Role:    * Susa Day, MD - Primary  PHYSICIAN ASSISTANT:   ASSISTANTS: [place]   ANESTHESIA:   general  EBL:  Total I/O In: 1000 [I.V.:1000] Out: -   BLOOD ADMINISTERED:none  DRAINS: none   LOCAL MEDICATIONS USED:  MARCAINE     SPECIMEN:  Source of Specimen:  L5S1  DISPOSITION OF SPECIMEN:  PATHOLOGY  COUNTS:  YES  TOURNIQUET:  * No tourniquets in log *  DICTATION: .Other Dictation: Dictation Number  [id number]   PLAN OF CARE: Admit for overnight observation  PATIENT DISPOSITION:  PACU - hemodynamically stable.   Delay start of Pharmacological VTE agent (>24hrs) due to surgical blood loss or risk of bleeding: yes

## 2015-09-16 NOTE — Interval H&P Note (Signed)
History and Physical Interval Note:  09/16/2015 10:24 AM  Nicholas Stevens  has presented today for surgery, with the diagnosis of HNP L5 - S1  The various methods of treatment have been discussed with the patient and family. After consideration of risks, benefits and other options for treatment, the patient has consented to  Procedure(s): MICRO LUMBAR DECOMPRESSION L5 - S1 (N/A) as a surgical intervention .  The patient's history has been reviewed, patient examined, no change in status, stable for surgery.  I have reviewed the patient's chart and labs.  Questions were answered to the patient's satisfaction.     BEANE,JEFFREY C

## 2015-09-16 NOTE — Plan of Care (Signed)
Problem: Consults Goal: Diagnosis - Spinal Surgery Lumbar Laminectomy (Complex)     

## 2015-09-16 NOTE — Anesthesia Procedure Notes (Addendum)
Procedure Name: Intubation Date/Time: 09/16/2015 11:27 AM Performed by: Deliah Boston Pre-anesthesia Checklist: Patient identified, Emergency Drugs available, Suction available and Patient being monitored Patient Re-evaluated:Patient Re-evaluated prior to inductionOxygen Delivery Method: Circle System Utilized Preoxygenation: Pre-oxygenation with 100% oxygen Intubation Type: IV induction Ventilation: Mask ventilation without difficulty and Oral airway inserted - appropriate to patient size Laryngoscope Size: Mac and 4 Grade View: Grade III Tube type: Oral Tube size: 8.0 mm Number of attempts: 2 Airway Equipment and Method: Oral airway and Bougie stylet Placement Confirmation: positive ETCO2 and breath sounds checked- equal and bilateral Secured at: 22 cm Tube secured with: Tape Dental Injury: Teeth and Oropharynx as per pre-operative assessment  Difficulty Due To: Difficulty was anticipated, Difficult Airway- due to anterior larynx and Difficult Airway- due to large tongue Comments: DVL with MAC 4 by CRNA , visualize base of arytenoids attempt to pass ETT with bougie, - EtCo2, - breath sounds. ETT removed pt bagged with 100 % O2, DVL with Mac 4 by Dr. Ermalene Postin, grade 3 view with intubation with Bougie stylet, + EtCo2, + bil breath sounds.

## 2015-09-16 NOTE — Discharge Instructions (Signed)
Walk As Tolerated utilizing back precautions.  No bending, twisting, or lifting.  No driving for 2 weeks.   °Aquacel dressing may remain in place until follow up. May shower with aquacel dressing in place. If the dressing peels off or becomes saturated, you may remove aquacel dressing and place gauze and tape dressing which should be kept clean and dry and changed daily. Do not remove steri-strips if they are present. °See Dr. Casandra Dallaire in office in 10 to 14 days. Begin taking aspirin 81mg per day starting 4 days after your surgery if not allergic to aspirin or on another blood thinner. °Walk daily even outside. Use a cane or walker only if necessary. °Avoid sitting on soft sofas. ° °

## 2015-09-16 NOTE — H&P (View-Only) (Signed)
Nicholas Stevens is an 44 y.o. male.   Chief Complaint: back and R leg pain, weakness HPI: The patient is a 44 year old male who presents today for follow up of their back. The patient is being followed for their back pain. They are now 2 weeks and 4 days out from when symptoms began. Symptoms reported today include: pain, numbness (right foot) and leg pain (right). The patient states that they are doing 30 percent better. Current treatment includes: activity modification, pain medications and Prednisone and use of ice. The following medication has been used for pain control: Percocet. The patient reports their current pain level to be moderate. The patient presents today following MRI. Note for "Follow-up back": The patient is out of work.  Nicholas Stevens follows up with his wife. He is two days from finishing his Sterapred dosepak. He still reports no changes in his numbness in the foot, weakness, less pain. He is taking Percocet, which is better than the Norco. He has been out of work.  REVIEW OF SYSTEMS Review of systems is negative for fevers, chest pain, shortness of breath, unexplained recent weight loss, loss of bowel or bladder function, burning with urination, joint swelling, rashes, weakness or numbness, difficulty with balance, easy bruising, excessive thirst or frequent urination.  Past Medical History  Diagnosis Date  . OSA (obstructive sleep apnea)   . Overweight(278.02)   . Liver hemangioma   . DDD (degenerative disc disease), lumbar   . Tinea barbae   . Morbid obesity     Past Surgical History  Procedure Laterality Date  . Rotator cuff repair Right 2005    Dr Sharol Given  . Laparoscopic appendectomy  2007    Dr Marlou Starks  . Breath tek h pylori N/A 08/20/2013    Procedure: BREATH TEK H PYLORI;  Surgeon: Pedro Earls, MD;  Location: [facility];  Service: General;  Laterality: N/A;  . Laparoscopic gastric sleeve resection N/A 10/08/2013    Procedure: LAPAROSCOPIC SLEEVE  GASTRECTOMY;  Surgeon: Pedro Earls, MD;  Location: [facility];  Service: General;  Laterality: N/A;    Family History  Problem Relation Age of Onset  . Liver disease Father     ETOH, Hep C  . Hypertension Father   . Other Mother     hx of DJD   Social History:  reports that he has never smoked. He has never used smokeless tobacco. He reports that he does not drink alcohol or use illicit drugs.  Allergies: No Known Allergies   (Not in a hospital admission)  No results found for this or any previous visit (from the past 48 hour(s)). No results found.  Review of Systems  Constitutional: Negative.   HENT: Negative.   Eyes: Negative.   Respiratory: Negative.   Cardiovascular: Negative.   Gastrointestinal: Negative.   Genitourinary: Negative.   Musculoskeletal: Positive for back pain.  Skin: Negative.   Neurological: Positive for sensory change and focal weakness.    There were no vitals taken for this visit. Physical Exam  Constitutional: He is oriented to person, place, and time. He appears well-developed. He appears distressed.  HENT:  Head: Normocephalic.  Eyes: Pupils are equal, round, and reactive to light.  Neck: Normal range of motion.  Cardiovascular: Normal rate.   Respiratory: Effort normal.  GI: Soft.  Musculoskeletal:  Moderate to severe distress, walks with an antalgic gait. Mood and affect is anxious. Tender on the right proximal sciatic notch. His straight leg raise produces buttock,  thigh and calf pain on the right, exacerbated with dorsal augmentation maneuver. Negative on the left. He has decreased sensation. He has significant numbness in the S1 dermatome, particularly in the lateral aspect of the foot and the heel. He has an absent Achilles reflex on the left, 2+ on the right, 1+ in the knees bilaterally. His EHL is 3+/5 on the right compared to the left. His plantar flexion is 4/5 on the right compared to the left as well. His abductor strength is 5-/5 on  the left compared to the right. No instability to hips, knees and ankles. Pulses are intact. No flank pain with percussion. Abdomen is soft and nontender  Neurological: He is alert and oriented to person, place, and time.    MRI demonstrates a large extruded fragment at L5-S1, compressing the S1 nerve root. There is some disc degeneration noted at L5-S1, no pars defect.  Assessment/Plan HNP L5-S1 S1 radiculopathy with myotomal weakness, S1 dermatomal dysesthesias secondary to large disc herniation at L5-S1 compressing the S1 nerve root.  Given the persistence of his symptoms with the presence of profound neurologic deficit, a large neurocompressive lesion noted by MRI, I discussed with Nicholas Stevens it would be wise to consider and proceed with lumbar decompression at this point in time given that he has been refractory to rest, activity modification, home exercise and prednisone pack.  I had an extensive discussion of the risks and benefits of the lumbar decompression with the patient including bleeding, infection, damage to neurovascular structures, epidural fibrosis, CSF leak requiring repair. We also discussed increase in pain, adjacent segment disease, recurrent disc herniation, need for future surgery including repeat decompression and/or fusion. We also discussed risks of postoperative hematoma, paralysis, anesthetic complications including DVT, PE, death, cardiopulmonary dysfunction. In addition, the perioperative and postoperative courses were discussed in detail including the rehabilitative time and return to functional activity and work. I provided the patient with an illustrated handout and utilized the appropriate surgical models.  I would like to reexamine him on Monday or Tuesday. We will proceed on scheduling him on Wednesday or Thursday. If by chance, he has a remarkable recovery after final completion of his Dosepak and rest, we can consider nonsurgical option; however, again, given the  persistence of his neurologic deficit, I would recommend urgent decompression. We discussed with he and his wife in extensive detail and provided them with handout. If he has any changes in the interim, he is to call. He has no bowel or bladder dysfunction at this point in time.  Plan microlumbar decompression L5-S1  Lacie Draft PA-C for Dr. Tonita Cong  09/14/2015, 8:57 AM

## 2015-09-16 NOTE — Anesthesia Preprocedure Evaluation (Addendum)
Anesthesia Evaluation    Reviewed: Allergy & Precautions, NPO status , Patient's Chart, lab work & pertinent test results  History of Anesthesia Complications Negative for: history of anesthetic complications  Airway Mallampati: IV  TM Distance: >3 FB Neck ROM: Full    Dental  (+) Teeth Intact   Pulmonary neg shortness of breath, sleep apnea , neg COPD, neg recent URI,    breath sounds clear to auscultation       Cardiovascular hypertension, Pt. on medications (-) angina(-) Past MI and (-) CHF  Rhythm:Regular     Neuro/Psych neg Seizures  Neuromuscular disease negative psych ROS   GI/Hepatic negative GI ROS, Neg liver ROS,   Endo/Other  Morbid obesity  Renal/GU negative Renal ROS     Musculoskeletal  (+) Arthritis ,   Abdominal   Peds  Hematology negative hematology ROS (+)   Anesthesia Other Findings   Reproductive/Obstetrics                            Anesthesia Physical Anesthesia Plan  ASA: II  Anesthesia Plan: General   Post-op Pain Management:    Induction: Intravenous  Airway Management Planned: Oral ETT  Additional Equipment: None  Intra-op Plan:   Post-operative Plan: Extubation in OR  Informed Consent: I have reviewed the patients History and Physical, chart, labs and discussed the procedure including the risks, benefits and alternatives for the proposed anesthesia with the patient or authorized representative who has indicated his/her understanding and acceptance.   Dental advisory given  Plan Discussed with: CRNA and Surgeon  Anesthesia Plan Comments:        Anesthesia Quick Evaluation

## 2015-09-16 NOTE — Transfer of Care (Signed)
Immediate Anesthesia Transfer of Care Note  Patient: Nicholas Stevens  Procedure(s) Performed: Procedure(s): MICRO LUMBAR DECOMPRESSION, MICRODISCECTOMY L5 - S1 (N/A)  Patient Location: PACU  Anesthesia Type:General  Level of Consciousness: Patient easily awoken, sedated, comfortable, cooperative, following commands, responds to stimulation.   Airway & Oxygen Therapy: Patient spontaneously breathing, ventilating well, oxygen via simple oxygen mask.  Post-op Assessment: Report given to PACU RN, vital signs reviewed and stable, moving all extremities.   Post vital signs: Reviewed and stable.  Complications: No apparent anesthesia complications

## 2015-09-17 DIAGNOSIS — M5126 Other intervertebral disc displacement, lumbar region: Secondary | ICD-10-CM | POA: Diagnosis not present

## 2015-09-17 NOTE — Progress Notes (Signed)
Subjective: 1 Day Post-Op Procedure(s) (LRB): MICRO LUMBAR DECOMPRESSION, MICRODISCECTOMY L5 - S1 (N/A) Patient reports pain as mild.   Reports incisional back pain. Leg pain improved. Numbness improved, almost entirely resolved, still in 3 of the toes. Weakness feels better. Voiding without difficulty. No other c/o. Feels ready for D/C home.  Objective: Vital signs in last 24 hours: Temp:  [97.3 F (36.3 C)-99.2 F (37.3 C)] 99 F (37.2 C) (09/22 0943) Pulse Rate:  [70-108] 90 (09/22 0943) Resp:  [10-16] 16 (09/22 0943) BP: (125-147)/(66-98) 147/91 mmHg (09/22 0943) SpO2:  [96 %-100 %] 97 % (09/22 0943)  Intake/Output from previous day: 09/21 0701 - 09/22 0700 In: 3530 [P.O.:480; I.V.:2950; IV Piggyback:100] Out: 2200 [Urine:2200] Intake/Output this shift:     Recent Labs  09/15/15 1520  HGB 15.9    Recent Labs  09/15/15 1520  WBC 5.9  RBC 5.62  HCT 47.7  PLT 269    Recent Labs  09/15/15 1520  NA 139  K 3.7  CL 102  CO2 28  BUN 11  CREATININE 0.80  GLUCOSE 99  CALCIUM 10.0   No results for input(s): LABPT, INR in the last 72 hours.  Neurologically intact ABD soft Neurovascular intact Sensation intact distally Intact pulses distally Dorsiflexion/Plantar flexion intact Incision: dressing C/D/I and no drainage No cellulitis present Compartment soft no sign of DVT  EHL 4-/5 now improved from 3/5 pre-op Improved distal sensation  Assessment/Plan: 1 Day Post-Op Procedure(s) (LRB): MICRO LUMBAR DECOMPRESSION, MICRODISCECTOMY L5 - S1 (N/A) Advance diet Up with therapy D/C IV fluids  Discussed D/C instructions, Lspine precautions D/C home today F/up in 2 weeks for suture removal Seen by myself and Dr. Mliss Fritz, [place] Rolls. 09/17/2015, 9:58 AM

## 2015-09-17 NOTE — Plan of Care (Signed)
Problem: Consults Goal: Diagnosis - Spinal Surgery Outcome: Completed/Met Date Met:  09/17/15 Microdiscectomy

## 2015-09-17 NOTE — Evaluation (Signed)
Physical Therapy Evaluation Patient Details Name: Nicholas Stevens MRN: [id number] DOB: March 17, 1971 Today's Date: 09/17/2015   History of Present Illness  L5 - S1 micro-lumbar decompression  Clinical Impression  Pt s/p back surgery presents with functional mobility limitations 2* post op pain and back precautions.  Pt mobilizing well and with good awareness of post op restrictions.  Pt plans dc home this date with family assist.   Follow Up Recommendations No PT follow up    Equipment Recommendations  None recommended by PT    Recommendations for Other Services       Precautions / Restrictions Precautions Precautions: Back Precaution Booklet Issued: Yes (comment) Precaution Comments: Back precautions reviewed x 2 Restrictions Weight Bearing Restrictions: No      Mobility  Bed Mobility Overal bed mobility: Needs Assistance Bed Mobility: Supine to Sit     Supine to sit: Supervision     General bed mobility comments: cues for sequence and correct log roll technique  Transfers Overall transfer level: Needs assistance Equipment used: None Transfers: Sit to/from Stand Sit to Stand: Min guard;Supervision         General transfer comment: cues for transition position, use of UEs to self assist and adherence to back precautions  Ambulation/Gait Ambulation/Gait assistance: Min guard;Supervision Ambulation Distance (Feet): 500 Feet Assistive device: None Gait Pattern/deviations: Step-through pattern     General Gait Details: mod pace  Stairs Stairs: Yes Stairs assistance: Supervision Stair Management: One rail Left;Alternating pattern;Forwards Number of Stairs: 4    Wheelchair Mobility    Modified Rankin (Stroke Patients Only)       Balance                                             Pertinent Vitals/Pain Pain Assessment: 0-10 Pain Score: 3  Pain Location: Back with min residual numbness L LE Pain Descriptors / Indicators:  Sore Pain Intervention(s): Limited activity within patient's tolerance;Monitored during session;Premedicated before session    Home Living Family/patient expects to be discharged to:: Private residence Living Arrangements: Spouse/significant other Available Help at Discharge: Family Type of Home: House Home Access: Stairs to enter   Technical brewer of Steps: 1 Home Layout: Two level Home Equipment: None      Prior Function Level of Independence: Independent               Hand Dominance        Extremity/Trunk Assessment   Upper Extremity Assessment: Overall WFL for tasks assessed           Lower Extremity Assessment: Overall WFL for tasks assessed      Cervical / Trunk Assessment: Normal  Communication   Communication: No difficulties  Cognition Arousal/Alertness: Awake/alert Behavior During Therapy: WFL for tasks assessed/performed Overall Cognitive Status: Within Functional Limits for tasks assessed                      General Comments      Exercises        Assessment/Plan    PT Assessment Patent does not need any further PT services  PT Diagnosis Difficulty walking   PT Problem List    PT Treatment Interventions     PT Goals (Current goals can be found in the Care Plan section) Acute Rehab PT Goals Patient Stated Goal: Resume active lifestye with decreased pain PT Goal Formulation:  All assessment and education complete, DC therapy    Frequency     Barriers to discharge        Co-evaluation               End of Session   Activity Tolerance: Patient tolerated treatment well Patient left: in chair;with call bell/phone within reach;with family/visitor present Nurse Communication: Mobility status    Functional Assessment Tool Used: clinical judgement Functional Limitation: Mobility: Walking and moving around Mobility: Walking and Moving Around Current Status (J8841): At least 1 percent but less than 20 percent  impaired, limited or restricted Mobility: Walking and Moving Around Goal Status [phone]): At least 1 percent but less than 20 percent impaired, limited or restricted Mobility: Walking and Moving Around Discharge Status [phone]): At least 1 percent but less than 20 percent impaired, limited or restricted    Time: 0905-0921 PT Time Calculation (min) (ACUTE ONLY): 16 min   Charges:   PT Evaluation $Initial PT Evaluation Tier I: 1 Procedure     PT G Codes:   PT G-Codes **NOT FOR INPATIENT CLASS** Functional Assessment Tool Used: clinical judgement Functional Limitation: Mobility: Walking and moving around Mobility: Walking and Moving Around Current Status (U9323): At least 1 percent but less than 20 percent impaired, limited or restricted Mobility: Walking and Moving Around Goal Status [phone]): At least 1 percent but less than 20 percent impaired, limited or restricted Mobility: Walking and Moving Around Discharge Status [phone]): At least 1 percent but less than 20 percent impaired, limited or restricted    BRADSHAW,HUNTER 09/17/2015, 10:05 AM

## 2015-09-17 NOTE — Evaluation (Signed)
Occupational Therapy One Time Evaluation Patient Details Name: Nicholas Stevens MRN: [id number] DOB: 06-27-1971 Today's Date: 09/17/2015    History of Present Illness L5 - S1 micro-lumbar decompression   Clinical Impression   Pt doing very well--overall at supervision level with ADL. Wife able to assist PRN at home. All education completed this session. Reviewed all back precautions and handout reviewed.    Follow Up Recommendations  Supervision - Intermittent;No OT follow up    Equipment Recommendations  None recommended by OT    Recommendations for Other Services       Precautions / Restrictions Precautions Precautions: Back Precaution Booklet Issued: Yes (comment) Precaution Comments: handout in room. reviewed handout and all precautions. Pt stated 3/3 on his own. Restrictions Weight Bearing Restrictions: No      Mobility Bed Mobility           General bed mobility comments: in chair.   Transfers Overall transfer level: Needs assistance Equipment used: None Transfers: Sit to/from Stand Sit to Stand: Supervision         General transfer comment: cues for back precautions.    Balance                                            ADL Overall ADL's : Needs assistance/impaired Eating/Feeding: Independent;Sitting   Grooming: Wash/dry hands;Set up;Sitting   Upper Body Bathing: Set up;Sitting   Lower Body Bathing: Supervison/ safety;Sit to/from stand   Upper Body Dressing : Set up;Supervision/safety;Standing   Lower Body Dressing: Supervision/safety;Sit to/from stand   Toilet Transfer: Supervision/safety;Ambulation;Comfort height toilet;Grab bars   Toileting- Clothing Manipulation and Hygiene: Supervision/safety;Sit to/from stand   Tub/ Shower Transfer: Walk-in shower;Supervision/safety     General ADL Comments: Wife was present for session. Reviewed all back precautions and handout information. Pt able to cross LEs up to don  elastic waistband shorts today and don shirt in standing with min cues. Discussed LHS to make washing LEs eaiser in shower. Pt able to descend to and stand from comfort height commode with bar without difficulty. He has a vanity next to commode at home he states he can use and he states he doesnt think a lower commode will be any problem at home. He did well with side stepping in and out of shower. Min cues for correct technique to scoot forward in chair.      Vision     Perception     Praxis      Pertinent Vitals/Pain Pain Assessment: 0-10 Pain Score: 4  Pain Location: back Pain Descriptors / Indicators: Sore Pain Intervention(s): Repositioned;Monitored during session     Hand Dominance     Extremity/Trunk Assessment Upper Extremity Assessment Upper Extremity Assessment: Overall WFL for tasks assessed      Cervical / Trunk Assessment Cervical / Trunk Assessment: Normal   Communication Communication Communication: No difficulties   Cognition Arousal/Alertness: Awake/alert Behavior During Therapy: WFL for tasks assessed/performed Overall Cognitive Status: Within Functional Limits for tasks assessed                     General Comments       Exercises       Shoulder Instructions      Home Living Family/patient expects to be discharged to:: [facility] home Living Arrangements: Spouse/significant other Available Help at Discharge: Family Type of Home: House Home Access: Stairs to enter  Entrance Stairs-Number of Steps: 1   Home Layout: Two level Alternate Level Stairs-Number of Steps: 14 Alternate Level Stairs-Rails: Left Bathroom Shower/Tub: Occupational psychologist: Standard     Home Equipment: None          Prior Functioning/Environment Level of Independence: Independent             OT Diagnosis: Generalized weakness   OT Problem List:     OT Treatment/Interventions:      OT Goals(Current goals can be found in the care plan  section) Acute Rehab OT Goals Patient Stated Goal: home OT Goal Formulation: With patient  OT Frequency:     Barriers to D/C:            Co-evaluation              End of Session    Activity Tolerance: Patient tolerated treatment well Patient left: in chair;with call bell/phone within reach;with family/visitor present   Time: 0981-1914 OT Time Calculation (min): 17 min Charges:  OT General Charges $OT Visit: 1 Procedure OT Evaluation $Initial OT Evaluation Tier I: 1 Procedure G-Codes: OT G-codes **NOT FOR INPATIENT CLASS** Functional Assessment Tool Used: clinical judgement Functional Limitation: Self care Self Care Current Status (N8295): At least 1 percent but less than 20 percent impaired, limited or restricted Self Care Goal Status (A2130): At least 1 percent but less than 20 percent impaired, limited or restricted Self Care Discharge Status [phone] At least 1 percent but less than 20 percent impaired, limited or restricted  Jules Schick  [phone] 09/17/2015, 11:38 AM

## 2015-09-17 NOTE — Discharge Summary (Signed)
Physician Discharge Summary   Patient ID: Nicholas Stevens MRN: [id number] DOB/AGE: [birth year] 46 y.o.  Admit date: 09/16/2015 Discharge date: 09/17/2015  Primary Diagnosis:   HNP L5 - S1  Admission Diagnoses:  Past Medical History  Diagnosis Date  . Overweight(278.02)   . Liver hemangioma   . DDD (degenerative disc disease), lumbar   . Tinea barbae   . Morbid obesity   . Hypertension   . OSA (obstructive sleep apnea)     no cpap used since weight loss  . HNP (herniated nucleus pulposus)   . Plantar fasciitis of right foot    Discharge Diagnoses:   Active Problems:   Spinal stenosis of lumbar region  Procedure:  Procedure(s) (LRB): MICRO LUMBAR DECOMPRESSION, MICRODISCECTOMY L5 - S1 (N/A)   Consults: None  HPI:  see H&P    Laboratory Data: Hospital Outpatient Visit on 09/15/2015  Component Date Value Ref Range Status  . MRSA, PCR 09/15/2015 NEGATIVE  NEGATIVE Final  . Staphylococcus aureus 09/15/2015 NEGATIVE  NEGATIVE Final   Comment:        The Xpert SA Assay (FDA approved for NASAL specimens in patients over 77 years of age), is one component of a comprehensive surveillance program.  Test performance has been validated by [facility] for patients greater than or equal to 15 year old. It is not intended to diagnose infection nor to guide or monitor treatment.   . Sodium 09/15/2015 139  135 - 145 mmol/L Final  . Potassium 09/15/2015 3.7  3.5 - 5.1 mmol/L Final  . Chloride 09/15/2015 102  101 - 111 mmol/L Final  . CO2 09/15/2015 28  22 - 32 mmol/L Final  . Glucose, Bld 09/15/2015 99  65 - 99 mg/dL Final  . BUN 09/15/2015 11  6 - 20 mg/dL Final  . Creatinine, Ser 09/15/2015 0.80  0.61 - 1.24 mg/dL Final  . Calcium 09/15/2015 10.0  8.9 - 10.3 mg/dL Final  . GFR calc non Af Amer 09/15/2015 >60  >60 mL/min Final  . GFR calc Af Amer 09/15/2015 >60  >60 mL/min Final   Comment: (NOTE) The eGFR has been calculated using the CKD EPI equation. This  calculation has not been validated in all clinical situations. eGFR's persistently <60 mL/min signify possible Chronic Kidney Disease.   . Anion gap 09/15/2015 9  5 - 15 Final  . WBC 09/15/2015 5.9  4.0 - 10.5 K/uL Final  . RBC 09/15/2015 5.62  4.22 - 5.81 MIL/uL Final  . Hemoglobin 09/15/2015 15.9  13.0 - 17.0 g/dL Final  . HCT 09/15/2015 47.7  39.0 - 52.0 % Final  . MCV 09/15/2015 84.9  78.0 - 100.0 fL Final  . MCH 09/15/2015 28.3  26.0 - 34.0 pg Final  . MCHC 09/15/2015 33.3  30.0 - 36.0 g/dL Final  . RDW 09/15/2015 13.7  11.5 - 15.5 % Final  . Platelets 09/15/2015 269  150 - 400 K/uL Final    Recent Labs  09/15/15 1520  HGB 15.9    Recent Labs  09/15/15 1520  WBC 5.9  RBC 5.62  HCT 47.7  PLT 269    Recent Labs  09/15/15 1520  NA 139  K 3.7  CL 102  CO2 28  BUN 11  CREATININE 0.80  GLUCOSE 99  CALCIUM 10.0   No results for input(s): LABPT, INR in the last 72 hours.  X-Rays:Dg Lumbar Spine 2-3 Views  09/15/2015   CLINICAL DATA:  44 year old male with herniated nucleus pulposis of the  lumbar spine. Preoperative radiograph prior to surgery.  EXAM: LUMBAR SPINE - 2-3 VIEW  COMPARISON:  None.  FINDINGS: Transitional anatomy with sacralization of the L5 vertebral body. No evidence of acute fracture or malalignment. Mild loss of disc space height at L4-L5. Unremarkable bowel gas pattern. Normal bony mineralization without evidence of lytic or sclerotic lesion.  IMPRESSION: 1. Transitional anatomy with sacralization at L5. 2. Loss of disc space height at L4-L5 consistent with degenerative disc disease.   Electronically Signed   By: Jacqulynn Cadet M.D.   On: 09/15/2015 17:00   Dg Spine Portable 1 View  09/16/2015   CLINICAL DATA:  Surgical level L5-S1. Dr Tonita Cong request the spaces between the vertebrae to be #.  EXAM: PORTABLE SPINE - 1 VIEW  COMPARISON:  Earlier the same date.  Radiographs 09/15/2015.  FINDINGS: 1238 hours. Same numbering applied with transitional  partially sacralized L5 segment. The skin spreaders are present posteriorly at the L5 level. Blunt surgical instrument is directed towards the posterior aspect of the L4-5 disc space.  IMPRESSION: Surgical instruments at the L4-5 disc space level. Note the patient has transitional anatomy with L5 designated the transitional segment. Correlation with prior cross-sectional imaging recommended.   Electronically Signed   By: Richardean Sale M.D.   On: 09/16/2015 12:54   Dg Spine Portable 1 View  09/16/2015   CLINICAL DATA:  Surgical level L5-S1. Request spinous processes be numbered  EXAM: PORTABLE SPINE - 1 VIEW  COMPARISON:  Plain film from earlier same day and lumbar spine plain film dated 09/15/2015.  FINDINGS: A single lateral projection of the lumbosacral spine, intraoperative, is provided. Based on the numbering on the AP and lateral lumbar spine exam of 09/15/2015, surgical hardware overlies the L5 spinous process.  IMPRESSION: Based on the numbering of the lumbar spine on exam of 09/15/2015, surgical hardware overlies the L5 spinous process.   Electronically Signed   By: Franki Cabot M.D.   On: 09/16/2015 12:09   Dg Spine Portable 1 View  09/16/2015   CLINICAL DATA:  Surgical level L5-S1.  EXAM: PORTABLE SPINE - 1 VIEW  COMPARISON:  09/15/2015  FINDINGS: Transitional anatomy at the lumbosacral junction. Numbering in the same fashion as on preoperative plain films, the posterior needles are directed at the L5 vertebral body and L4 vertebral body.  IMPRESSION: Transitional anatomy. Numbering follows preoperative plain films. Intraoperative localization as above. Care should be taken to correlate closely with any outside cross-sectional imaging.   Electronically Signed   By: Rolm Baptise M.D.   On: 09/16/2015 11:53    EKG: Orders placed or performed during the hospital encounter of 09/15/15  . EKG 12-Lead  . EKG 12-Lead     Hospital Course: Patient was admitted to [facility] and taken to  the OR and underwent the above state procedure without complications.  Patient tolerated the procedure well and was later transferred to the recovery room and then to the orthopaedic floor for postoperative care.  They were given PO and IV analgesics for pain control following their surgery.  They were given 24 hours of postoperative antibiotics.   PT was consulted postop to assist with mobility and transfers.  The patient was allowed to be WBAT with therapy and was taught back precautions. Discharge planning was consulted to help with postop disposition and equipment needs.  Patient had a good night on the evening of surgery and started to get up OOB with therapy on day one. Patient was seen in  rounds and was ready to go home on day one.  They were given discharge instructions and dressing directions.  They were instructed on when to follow up in the office with Dr. Tonita Cong.   Diet: Regular diet Activity:WBAT; Lspine precautions Follow-up:in 10-14 days Disposition - Home Discharged Condition: good   Discharge Instructions    Call MD / Call 911    Complete by:  As directed   If you experience chest pain or shortness of breath, CALL 911 and be transported to the hospital emergency room.  If you develope a fever above 101 F, pus (white drainage) or increased drainage or redness at the wound, or calf pain, call your surgeon's office.     Constipation Prevention    Complete by:  As directed   Drink plenty of fluids.  Prune juice may be helpful.  You may use a stool softener, such as Colace (over the counter) 100 mg twice a day.  Use MiraLax (over the counter) for constipation as needed.     Diet - low sodium heart healthy    Complete by:  As directed      Increase activity slowly as tolerated    Complete by:  As directed             Medication List    STOP taking these medications        cyclobenzaprine 10 MG tablet  Commonly known as:  FLEXERIL     METRONIDAZOLE (TOPICAL) 0.75 % Lotn      naproxen 500 MG tablet  Commonly known as:  NAPROSYN     traZODone 50 MG tablet  Commonly known as:  DESYREL      TAKE these medications        bacitracin ointment  Apply 1 application topically 2 (two) times daily.     cholecalciferol 1000 UNITS tablet  Commonly known as:  VITAMIN D  Take 1 tablet (1,000 Units total) by mouth daily.     docusate sodium 100 MG capsule  Commonly known as:  COLACE  Take 1 capsule (100 mg total) by mouth 2 (two) times daily as needed for mild constipation.     methocarbamol 500 MG tablet  Commonly known as:  ROBAXIN  Take 1 tablet (500 mg total) by mouth 3 (three) times daily.     multivitamin with minerals Tabs tablet  Take 1 tablet by mouth daily.     oxyCODONE-acetaminophen 5-325 MG per tablet  Commonly known as:  PERCOCET  Take 1 tablet by mouth every 4 (four) hours as needed.     triamterene-hydrochlorothiazide 37.5-25 MG per capsule  Commonly known as:  DYAZIDE  Take 1 capsule by mouth every morning.           Follow-up Information    Follow up with BEANE,JEFFREY C, MD In 2 weeks.   Specialty:  Orthopedic Surgery   Why:  For suture removal   Contact information:   [address] [place] [address] [phone]       Signed: Lacie Draft, PA-C Orthopaedic Surgery 09/17/2015, 10:01 AM
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
## 2015-09-18 NOTE — Anesthesia Postprocedure Evaluation (Signed)
  Anesthesia Post-op Note  Patient: Nicholas Stevens  Procedure(s) Performed: Procedure(s): MICRO LUMBAR DECOMPRESSION, MICRODISCECTOMY L5 - S1 (N/A)  Patient Location: PACU  Anesthesia Type:General  Level of Consciousness: awake  Airway and Oxygen Therapy: Patient Spontanous Breathing  Post-op Pain: mild  Post-op Assessment: Post-op Vital signs reviewed, Patient's Cardiovascular Status Stable, Respiratory Function Stable, Patent Airway, No signs of Nausea or vomiting and Pain level controlled LLE Motor Response: Purposeful movement LLE Sensation: Full sensation RLE Motor Response: Purposeful movement RLE Sensation: Tingling, Full sensation      Post-op Vital Signs: Reviewed and stable  Last Vitals:  Filed Vitals:   09/17/15 0943  BP: 147/91  Pulse: 90  Temp: 37.2 C  Resp: 16    Complications: No apparent anesthesia complications

## 2016-09-30 ENCOUNTER — Encounter (HOSPITAL_COMMUNITY): Payer: Self-pay

## 2016-12-14 ENCOUNTER — Encounter: Payer: Self-pay | Admitting: Internal Medicine

## 2016-12-14 ENCOUNTER — Ambulatory Visit (INDEPENDENT_AMBULATORY_CARE_PROVIDER_SITE_OTHER): Payer: Commercial Managed Care - HMO | Admitting: Internal Medicine

## 2016-12-14 VITALS — BP 122/70 | HR 69 | Resp 20 | Wt 290.0 lb

## 2016-12-14 DIAGNOSIS — H1030 Unspecified acute conjunctivitis, unspecified eye: Secondary | ICD-10-CM | POA: Insufficient documentation

## 2016-12-14 DIAGNOSIS — G4700 Insomnia, unspecified: Secondary | ICD-10-CM | POA: Diagnosis not present

## 2016-12-14 DIAGNOSIS — H1032 Unspecified acute conjunctivitis, left eye: Secondary | ICD-10-CM

## 2016-12-14 MED ORDER — ERYTHROMYCIN 5 MG/GM OP OINT: 1.0000 "application " | TOPICAL_OINTMENT | Freq: Four times a day (QID) | OPHTHALMIC | 0 refills | Status: DC

## 2016-12-14 MED ORDER — ZOLPIDEM TARTRATE 10 MG PO TABS: 10.0000 mg | ORAL_TABLET | Freq: Every evening | ORAL | 2 refills | Status: DC | PRN

## 2016-12-14 NOTE — Patient Instructions (Signed)
Please take all new medication as prescribed - the eye antibiotic, and the ambien as needed for sleep  Please continue all other medications as before, and refills have been done if requested.  Please have the pharmacy call with any other refills you may need.  Please keep your appointments with your specialists as you may have planned

## 2016-12-14 NOTE — Progress Notes (Signed)
Pre visit review using our clinic review tool, if applicable. No additional management support is needed unless otherwise documented below in the visit note. 

## 2016-12-17 NOTE — Assessment & Plan Note (Signed)
Ok for ambien 10 qhs prn,  to f/u any worsening symptoms or concerns 

## 2016-12-17 NOTE — Assessment & Plan Note (Signed)
Mild to mod, for topical antibx course,  to f/u any worsening symptoms or concerns

## 2016-12-17 NOTE — Progress Notes (Signed)
Subjective:    Patient ID: Nicholas Stevens, male    DOB: 06/18/1971, 45 y.o.   MRN: [id number]  HPI  Here with 2-3 days acute onset bilat eye erythema, discomfort and slight d/c without fever, HA, sinus congestion, earache, ST, cough and Pt denies chest pain, increased sob or doe, wheezing, orthopnea, PND, increased LE swelling, palpitations, dizziness or syncope.  Pt denies new neurological symptoms such as new headache, or facial or extremity weakness or numbness   Pt denies polydipsia, polyuria.  Also with difficulty getting to sleep most nights, sometimes wakes up around 3 am as well. Past Medical History:  Diagnosis Date  . DDD (degenerative disc disease), lumbar   . HNP (herniated nucleus pulposus)   . Hypertension   . Liver hemangioma   . Morbid obesity ([place])   . OSA (obstructive sleep apnea)    no cpap used since weight loss  . Overweight(278.02)   . Plantar fasciitis of right foot   . Tinea barbae    Past Surgical History:  Procedure Laterality Date  . BREATH TEK H PYLORI N/A 08/20/2013   Procedure: BREATH TEK H PYLORI;  Surgeon: Pedro Earls, MD;  Location: [facility];  Service: General;  Laterality: N/A;  . LAPAROSCOPIC APPENDECTOMY  2007   Dr Marlou Starks  . LAPAROSCOPIC GASTRIC SLEEVE RESECTION N/A 10/08/2013   Procedure: LAPAROSCOPIC SLEEVE GASTRECTOMY;  Surgeon: Pedro Earls, MD;  Location: [facility];  Service: General;  Laterality: N/A;  . LUMBAR LAMINECTOMY/DECOMPRESSION MICRODISCECTOMY N/A 09/16/2015   Procedure: MICRO LUMBAR DECOMPRESSION, MICRODISCECTOMY L5 - S1;  Surgeon: Susa Day, MD;  Location: [facility];  Service: Orthopedics;  Laterality: N/A;  . ROTATOR CUFF REPAIR Right 2005   Dr Sharol Given    reports that he has never smoked. He has never used smokeless tobacco. He reports that he does not drink alcohol or use drugs. family history includes Hypertension in his father; Liver disease in his father; Other in his mother. No Known Allergies Current Outpatient  Prescriptions on File Prior to Visit  Medication Sig Dispense Refill  . Multiple Vitamin (MULTIVITAMIN WITH MINERALS) TABS tablet Take 1 tablet by mouth daily.    [name] triamterene-hydrochlorothiazide (DYAZIDE) 37.5-25 MG per capsule Take 1 capsule by mouth every morning.    . bacitracin ointment Apply 1 application topically 2 (two) times daily. (Patient not taking: Reported on 12/14/2016) 28 g 0  . docusate sodium (COLACE) 100 MG capsule Take 1 capsule (100 mg total) by mouth 2 (two) times daily as needed for mild constipation. (Patient not taking: Reported on 12/14/2016) 20 capsule 1  . methocarbamol (ROBAXIN) 500 MG tablet Take 1 tablet (500 mg total) by mouth 3 (three) times daily. (Patient not taking: Reported on 12/14/2016) 40 tablet 1  . oxyCODONE-acetaminophen (PERCOCET) 5-325 MG per tablet Take 1 tablet by mouth every 4 (four) hours as needed. (Patient not taking: Reported on 12/14/2016) 40 tablet 0   No current facility-administered medications on file prior to visit.    Review of Systems All otherwise neg per pt    Objective:   Physical Exam BP 122/70   Pulse 69   Resp 20   Wt 290 lb (131.5 kg)   SpO2 97%   BMI 36.74 kg/m  VS noted,  Constitutional: Pt appears in no apparent distress HENT: Head: NCAT.  Right Ear: External ear normal.  Left Ear: External ear normal.  Eyes: . Pupils are equal, round, and reactive to light. Conjunctivae with bilat erythema and small matted  d/c, with slight lid swellings as well without erythema, and EOM are normal Neck: Normal range of motion. Neck supple.  Cardiovascular: Normal rate and regular rhythm.   Pulmonary/Chest: Effort normal and breath sounds without rales or wheezing.  Neurological: Pt is alert. Not confused , motor grossly intact Skin: Skin is warm. No rash, no LE edema Psychiatric: Pt behavior is normal. No agitation.  No other new exam findings     Assessment & Plan:

## 2017-05-09 ENCOUNTER — Encounter (HOSPITAL_COMMUNITY): Payer: Self-pay

## 2017-05-22 ENCOUNTER — Other Ambulatory Visit: Payer: Self-pay | Admitting: Internal Medicine

## 2017-05-29 ENCOUNTER — Other Ambulatory Visit: Payer: Self-pay | Admitting: Internal Medicine

## 2017-06-08 ENCOUNTER — Other Ambulatory Visit: Payer: Self-pay | Admitting: Internal Medicine

## 2017-06-08 NOTE — Telephone Encounter (Signed)
Done hardcopy to [place]  

## 2017-06-09 NOTE — Telephone Encounter (Signed)
faxed

## 2017-06-25 DIAGNOSIS — L738 Other specified follicular disorders: Secondary | ICD-10-CM | POA: Diagnosis not present

## 2017-08-30 ENCOUNTER — Telehealth: Payer: Self-pay | Admitting: Internal Medicine

## 2017-08-30 NOTE — Telephone Encounter (Signed)
error 

## 2017-09-22 DIAGNOSIS — Z0100 Encounter for examination of eyes and vision without abnormal findings: Secondary | ICD-10-CM | POA: Diagnosis not present

## 2017-10-19 DIAGNOSIS — H401131 Primary open-angle glaucoma, bilateral, mild stage: Secondary | ICD-10-CM | POA: Diagnosis not present

## 2017-10-30 ENCOUNTER — Other Ambulatory Visit: Payer: Self-pay | Admitting: Internal Medicine

## 2017-10-31 NOTE — Telephone Encounter (Signed)
Pt called requesting this refill also.

## 2017-11-01 NOTE — Telephone Encounter (Signed)
Patient needs OV before refills will be given

## 2017-11-01 NOTE — Telephone Encounter (Signed)
Appointment scheduled.

## 2017-11-02 ENCOUNTER — Other Ambulatory Visit: Payer: Self-pay | Admitting: Internal Medicine

## 2017-11-02 MED ORDER — ZOLPIDEM TARTRATE 10 MG PO TABS: 10.0000 mg | ORAL_TABLET | Freq: Every evening | ORAL | 0 refills | Status: DC | PRN

## 2017-11-03 ENCOUNTER — Encounter: Payer: Self-pay | Admitting: Internal Medicine

## 2017-11-03 ENCOUNTER — Ambulatory Visit (INDEPENDENT_AMBULATORY_CARE_PROVIDER_SITE_OTHER): Payer: 59 | Admitting: Internal Medicine

## 2017-11-03 ENCOUNTER — Other Ambulatory Visit (INDEPENDENT_AMBULATORY_CARE_PROVIDER_SITE_OTHER): Payer: 59

## 2017-11-03 DIAGNOSIS — Z0000 Encounter for general adult medical examination without abnormal findings: Secondary | ICD-10-CM

## 2017-11-03 DIAGNOSIS — I10 Essential (primary) hypertension: Secondary | ICD-10-CM

## 2017-11-03 DIAGNOSIS — G4700 Insomnia, unspecified: Secondary | ICD-10-CM | POA: Diagnosis not present

## 2017-11-03 LAB — URINALYSIS
Bilirubin Urine: NEGATIVE
Hgb urine dipstick: NEGATIVE
Ketones, ur: NEGATIVE
LEUKOCYTES UA: NEGATIVE
NITRITE: NEGATIVE
PH: 7 (ref 5.0–8.0)
SPECIFIC GRAVITY, URINE: 1.02 (ref 1.000–1.030)
TOTAL PROTEIN, URINE-UPE24: NEGATIVE
Urine Glucose: NEGATIVE
Urobilinogen, UA: 0.2 (ref 0.0–1.0)

## 2017-11-03 LAB — BASIC METABOLIC PANEL
BUN: 10 mg/dL (ref 6–23)
CALCIUM: 10.1 mg/dL (ref 8.4–10.5)
CO2: 31 mEq/L (ref 19–32)
Chloride: 101 mEq/L (ref 96–112)
Creatinine, Ser: 0.89 mg/dL (ref 0.40–1.50)
GFR: 117.32 mL/min (ref >60.00)
Glucose, Bld: 81 mg/dL (ref 70–99)
POTASSIUM: 4.3 meq/L (ref 3.5–5.1)
SODIUM: 138 meq/L (ref 135–145)

## 2017-11-03 LAB — CBC WITH DIFFERENTIAL/PLATELET
Basophils Absolute: 0 10*3/uL (ref 0.0–0.1)
Basophils Relative: 0.3 % (ref 0.0–3.0)
EOS PCT: 1.8 % (ref 0.0–5.0)
Eosinophils Absolute: 0.1 10*3/uL (ref 0.0–0.7)
HEMATOCRIT: 43.7 % (ref 39.0–52.0)
HEMOGLOBIN: 14.4 g/dL (ref 13.0–17.0)
LYMPHS ABS: 1.8 10*3/uL (ref 0.7–4.0)
LYMPHS PCT: 42.3 % (ref 12.0–46.0)
MCHC: 33 g/dL (ref 30.0–36.0)
MCV: 82.9 fl (ref 78.0–100.0)
MONOS PCT: 10.2 % (ref 3.0–12.0)
Monocytes Absolute: 0.4 10*3/uL (ref 0.1–1.0)
Neutro Abs: 1.9 10*3/uL (ref 1.4–7.7)
Neutrophils Relative %: 45.4 % (ref 43.0–77.0)
Platelets: 254 10*3/uL (ref 150.0–400.0)
RBC: 5.27 Mil/uL (ref 4.22–5.81)
RDW: 14.8 % (ref 11.5–15.5)
WBC: 4.2 10*3/uL (ref 4.0–10.5)

## 2017-11-03 LAB — LIPID PANEL
Cholesterol: 180 mg/dL (ref 0–200)
HDL: 38.4 mg/dL — AB (ref >39.00)
LDL Cholesterol: 115 mg/dL — ABNORMAL HIGH (ref 0–99)
NONHDL: 141.77
Total CHOL/HDL Ratio: 5
Triglycerides: 135 mg/dL (ref 0.0–149.0)
VLDL: 27 mg/dL (ref 0.0–40.0)

## 2017-11-03 LAB — HEPATIC FUNCTION PANEL
ALBUMIN: 4.7 g/dL (ref 3.5–5.2)
ALT: 17 U/L (ref 0–53)
AST: 15 U/L (ref 0–37)
Alkaline Phosphatase: 50 U/L (ref 39–117)
Bilirubin, Direct: 0.1 mg/dL (ref 0.0–0.3)
Total Bilirubin: 0.7 mg/dL (ref 0.2–1.2)
Total Protein: 7.9 g/dL (ref 6.0–8.3)

## 2017-11-03 LAB — TSH: TSH: 1.3 u[IU]/mL (ref 0.35–4.50)

## 2017-11-03 MED ORDER — TRIAMTERENE-HCTZ 37.5-25 MG PO CAPS: 1.0000 | ORAL_CAPSULE | Freq: Every morning | ORAL | 3 refills | Status: DC

## 2017-11-03 MED ORDER — ZOLPIDEM TARTRATE 10 MG PO TABS: 10.0000 mg | ORAL_TABLET | Freq: Every evening | ORAL | 1 refills | Status: DC | PRN

## 2017-11-03 MED ORDER — VITAMIN D3 50 MCG (2000 UT) PO CAPS: 2000.0000 [IU] | ORAL_CAPSULE | Freq: Every day | ORAL | 3 refills | Status: DC

## 2017-11-03 NOTE — Progress Notes (Signed)
Subjective:  Patient ID: Nicholas Stevens, male    DOB: 08/26/1971  Age: 46 y.o. MRN: [id number]  CC: No chief complaint on file.   HPI Nicholas Stevens presents for a well exam HTN, insomnia f/u  Outpatient Medications Prior to Visit  Medication Sig Dispense Refill  . erythromycin ophthalmic ointment Place 1 application into the left eye 4 (four) times daily. 3.5 g 0  . Multiple Vitamin (MULTIVITAMIN WITH MINERALS) TABS tablet Take 1 tablet by mouth daily.    [name] triamterene-hydrochlorothiazide (DYAZIDE) 37.5-25 MG per capsule Take 1 capsule by mouth every morning.    . zolpidem (AMBIEN) 10 MG tablet Take 1 tablet (10 mg total) at bedtime as needed by mouth. for sleep 30 tablet 0  . bacitracin ointment Apply 1 application topically 2 (two) times daily. (Patient not taking: Reported on 12/14/2016) 28 g 0  . docusate sodium (COLACE) 100 MG capsule Take 1 capsule (100 mg total) by mouth 2 (two) times daily as needed for mild constipation. (Patient not taking: Reported on 12/14/2016) 20 capsule 1  . methocarbamol (ROBAXIN) 500 MG tablet Take 1 tablet (500 mg total) by mouth 3 (three) times daily. (Patient not taking: Reported on 12/14/2016) 40 tablet 1  . oxyCODONE-acetaminophen (PERCOCET) 5-325 MG per tablet Take 1 tablet by mouth every 4 (four) hours as needed. (Patient not taking: Reported on 12/14/2016) 40 tablet 0   No facility-administered medications prior to visit.     ROS Review of Systems  Constitutional: Negative for appetite change, fatigue and unexpected weight change.  HENT: Negative for congestion, nosebleeds, sneezing, sore throat and trouble swallowing.   Eyes: Negative for itching and visual disturbance.  Respiratory: Negative for cough.   Cardiovascular: Negative for chest pain, palpitations and leg swelling.  Gastrointestinal: Negative for abdominal distention, blood in stool, diarrhea and nausea.  Genitourinary: Negative for frequency and hematuria.    Musculoskeletal: Negative for back pain, gait problem, joint swelling and neck pain.  Skin: Negative for rash.  Neurological: Negative for dizziness, tremors, speech difficulty and weakness.  Psychiatric/Behavioral: Negative for agitation, dysphoric mood and sleep disturbance. The patient is not nervous/anxious.     Objective:  BP (!) 148/96 (BP Location: Left Arm, Patient Position: Sitting, Cuff Size: Large)   Pulse 73   Temp 98.3 F (36.8 C) (Oral)   Ht 6' 2.5" (1.892 m)   Wt 293 lb (132.9 kg)   SpO2 98%   BMI 37.12 kg/m   BP Readings from Last 3 Encounters:  11/03/17 (!) 148/96  12/14/16 122/70  09/17/15 (!) 147/91    Wt Readings from Last 3 Encounters:  11/03/17 293 lb (132.9 kg)  12/14/16 290 lb (131.5 kg)  09/16/15 276 lb (125.2 kg)    Physical Exam  Constitutional: He is oriented to person, place, and time. He appears well-developed. No distress.  NAD  HENT:  Mouth/Throat: Oropharynx is clear and moist.  Eyes: Conjunctivae are normal. Pupils are equal, round, and reactive to light.  Neck: Normal range of motion. No JVD present. No thyromegaly present.  Cardiovascular: Normal rate, regular rhythm, normal heart sounds and intact distal pulses. Exam reveals no gallop and no friction rub.  No murmur heard. Pulmonary/Chest: Effort normal and breath sounds normal. No respiratory distress. He has no wheezes. He has no rales. He exhibits no tenderness.  Abdominal: Soft. Bowel sounds are normal. He exhibits no distension and no mass. There is no tenderness. There is no rebound and no guarding.  Musculoskeletal: Normal range  of motion. He exhibits no edema or tenderness.  Lymphadenopathy:    He has no cervical adenopathy.  Neurological: He is alert and oriented to person, place, and time. He has normal reflexes. No cranial nerve deficit. He exhibits normal muscle tone. He displays a negative Romberg sign. Coordination and gait normal.  Skin: Skin is warm and dry. No rash  noted.  Psychiatric: He has a normal mood and affect. His behavior is normal. Judgment and thought content normal.  obese Testes - self exam  Lab Results  Component Value Date   WBC 5.9 09/15/2015   HGB 15.9 09/15/2015   HCT 47.7 09/15/2015   PLT 269 09/15/2015   GLUCOSE 99 09/15/2015   CHOL 163 11/21/2014   TRIG 73.0 11/21/2014   HDL 36.70 (L) 11/21/2014   LDLCALC 112 (H) 11/21/2014   ALT 38 11/21/2014   AST 28 11/21/2014   NA 139 09/15/2015   K 3.7 09/15/2015   CL 102 09/15/2015   CREATININE 0.80 09/15/2015   BUN 11 09/15/2015   CO2 28 09/15/2015   TSH 0.87 11/21/2014   PSA 0.41 11/21/2014    Dg Lumbar Spine 2-3 Views  Result Date: 09/15/2015 CLINICAL DATA:  46 year old male with herniated nucleus pulposis of the lumbar spine. Preoperative radiograph prior to surgery. EXAM: LUMBAR SPINE - 2-3 VIEW COMPARISON:  None. FINDINGS: Transitional anatomy with sacralization of the L5 vertebral body. No evidence of acute fracture or malalignment. Mild loss of disc space height at L4-L5. Unremarkable bowel gas pattern. Normal bony mineralization without evidence of lytic or sclerotic lesion. IMPRESSION: 1. Transitional anatomy with sacralization at L5. 2. Loss of disc space height at L4-L5 consistent with degenerative disc disease. Electronically Signed   By: Jacqulynn Cadet M.D.   On: 09/15/2015 17:00   Dg Spine Portable 1 View  Result Date: 09/16/2015 CLINICAL DATA:  Surgical level L5-S1. Dr Tonita Cong request the spaces between the vertebrae to be #. EXAM: PORTABLE SPINE - 1 VIEW COMPARISON:  Earlier the same date.  Radiographs 09/15/2015. FINDINGS: 1238 hours. Same numbering applied with transitional partially sacralized L5 segment. The skin spreaders are present posteriorly at the L5 level. Blunt surgical instrument is directed towards the posterior aspect of the L4-5 disc space. IMPRESSION: Surgical instruments at the L4-5 disc space level. Note the patient has transitional anatomy with  L5 designated the transitional segment. Correlation with prior cross-sectional imaging recommended. Electronically Signed   By: Richardean Sale M.D.   On: 09/16/2015 12:54   Dg Spine Portable 1 View  Result Date: 09/16/2015 CLINICAL DATA:  Surgical level L5-S1. Request spinous processes be numbered EXAM: PORTABLE SPINE - 1 VIEW COMPARISON:  Plain film from earlier same day and lumbar spine plain film dated 09/15/2015. FINDINGS: A single lateral projection of the lumbosacral spine, intraoperative, is provided. Based on the numbering on the AP and lateral lumbar spine exam of 09/15/2015, surgical hardware overlies the L5 spinous process. IMPRESSION: Based on the numbering of the lumbar spine on exam of 09/15/2015, surgical hardware overlies the L5 spinous process. Electronically Signed   By: Franki Cabot M.D.   On: 09/16/2015 12:09   Dg Spine Portable 1 View  Result Date: 09/16/2015 CLINICAL DATA:  Surgical level L5-S1. EXAM: PORTABLE SPINE - 1 VIEW COMPARISON:  09/15/2015 FINDINGS: Transitional anatomy at the lumbosacral junction. Numbering in the same fashion as on preoperative plain films, the posterior needles are directed at the L5 vertebral body and L4 vertebral body. IMPRESSION: Transitional anatomy. Numbering follows preoperative plain  films. Intraoperative localization as above. Care should be taken to correlate closely with any outside cross-sectional imaging. Electronically Signed   By: Rolm Baptise M.D.   On: 09/16/2015 11:53    Assessment & Plan:   There are no diagnoses linked to this encounter. I have discontinued Gerron W. Rautio's bacitracin, oxyCODONE-acetaminophen, methocarbamol, and docusate sodium. I have also changed his triamterene-hydrochlorothiazide. Additionally, I am having him maintain his multivitamin with minerals, erythromycin, and zolpidem.  Meds ordered this encounter  Medications  . triamterene-hydrochlorothiazide (DYAZIDE) 37.5-25 MG capsule    Sig: Take 1  each (1 capsule total) every morning by mouth.    Dispense:  90 capsule    Refill:  3  . zolpidem (AMBIEN) 10 MG tablet    Sig: Take 1 tablet (10 mg total) at bedtime as needed by mouth. for sleep    Dispense:  90 tablet    Refill:  1    Not to exceed 3 additional fills before 06/12/2017.     Follow-up: No Follow-up on file.  Walker Kehr, MD

## 2017-11-03 NOTE — Assessment & Plan Note (Signed)
We discussed age appropriate health related issues, including available/recomended screening tests and vaccinations. We discussed a need for adhering to healthy diet and exercise. Labs were ordered to be later reviewed . All questions were answered.  Ophth  - early glaucoma Loose wt

## 2017-11-03 NOTE — Assessment & Plan Note (Signed)
Triamt /HCT to resume Labs

## 2017-11-03 NOTE — Assessment & Plan Note (Signed)
Zolpidem prn  Potential benefits of a long term benzodiazepines  use as well as potential risks  and complications were explained to the patient and were aknowledged. 

## 2018-06-12 DIAGNOSIS — H401131 Primary open-angle glaucoma, bilateral, mild stage: Secondary | ICD-10-CM | POA: Diagnosis not present

## 2018-07-06 DIAGNOSIS — M519 Unspecified thoracic, thoracolumbar and lumbosacral intervertebral disc disorder: Secondary | ICD-10-CM | POA: Diagnosis not present

## 2018-07-06 DIAGNOSIS — M545 Low back pain: Secondary | ICD-10-CM | POA: Diagnosis not present

## 2018-07-06 DIAGNOSIS — M5416 Radiculopathy, lumbar region: Secondary | ICD-10-CM | POA: Diagnosis not present

## 2018-07-16 ENCOUNTER — Telehealth: Payer: Self-pay | Admitting: *Deleted

## 2018-07-16 MED ORDER — ZOLPIDEM TARTRATE 10 MG PO TABS: 10.0000 mg | ORAL_TABLET | Freq: Every evening | ORAL | 0 refills | Status: DC | PRN

## 2018-07-16 NOTE — Telephone Encounter (Signed)
Called pt no answer LMOM MD appro ed refill, but need to set-up annual phyiscal for November.Marland KitchenJohny Stevens

## 2018-07-16 NOTE — Telephone Encounter (Signed)
Ok Rx Pls sch OV - well exam Thx

## 2018-07-16 NOTE — Telephone Encounter (Signed)
Copied from [place] [id number]. Topic: General - Other >> Jul 16, 2018 12:45 PM Oneta Rack wrote: Relation to pt: self  Call back number: [phone] Pharmacy: [id number] - [place], [place] [phone] (Phone) [phone] (Fax)  Reason for call:  Patient requesting zolpidem (AMBIEN) 10 MG tablet, patient informed please allow 48 to 72 hour turn around time, please advise

## 2018-07-30 ENCOUNTER — Encounter: Payer: Self-pay | Admitting: Internal Medicine

## 2018-07-30 ENCOUNTER — Ambulatory Visit: Payer: 59 | Admitting: Internal Medicine

## 2018-07-30 VITALS — BP 134/88 | HR 62 | Temp 98.1°F | Ht 74.5 in | Wt 293.0 lb

## 2018-07-30 DIAGNOSIS — H9201 Otalgia, right ear: Secondary | ICD-10-CM

## 2018-07-30 DIAGNOSIS — H6123 Impacted cerumen, bilateral: Secondary | ICD-10-CM | POA: Diagnosis not present

## 2018-07-30 DIAGNOSIS — N529 Male erectile dysfunction, unspecified: Secondary | ICD-10-CM | POA: Diagnosis not present

## 2018-07-30 DIAGNOSIS — H6120 Impacted cerumen, unspecified ear: Secondary | ICD-10-CM | POA: Insufficient documentation

## 2018-07-30 DIAGNOSIS — H9209 Otalgia, unspecified ear: Secondary | ICD-10-CM | POA: Insufficient documentation

## 2018-07-30 MED ORDER — NEOMYCIN-POLYMYXIN-HC 3.5-10000-1 OT SOLN: 3.0000 [drp] | Freq: Three times a day (TID) | OTIC | 3 refills | Status: AC

## 2018-07-30 MED ORDER — TADALAFIL 20 MG PO TABS: 20.0000 mg | ORAL_TABLET | Freq: Every day | ORAL | 11 refills | Status: DC | PRN

## 2018-07-30 NOTE — Assessment & Plan Note (Signed)
Will irrigate 

## 2018-07-30 NOTE — Patient Instructions (Addendum)
Use otic cortisporin if ears are irritated after the procedure

## 2018-07-30 NOTE — Assessment & Plan Note (Addendum)
See procedure Otic cortisporin if ears are irritated after the procedure

## 2018-07-30 NOTE — Progress Notes (Signed)
Subjective:  Patient ID: [name], male    DOB: [birth year]  Age: 47 y.o. MRN: [id number]  CC: No chief complaint on file.   HPI [place] presents for R ear fullness off and on x 1 month. C/o ED  Outpatient Medications Prior to Visit  Medication Sig Dispense Refill  . Cholecalciferol (VITAMIN D3) 2000 units capsule Take 1 capsule (2,000 Units total) daily by mouth. 100 capsule 3  . erythromycin ophthalmic ointment Place 1 application into the left eye 4 (four) times daily. 3.5 g 0  . latanoprost (XALATAN) 0.005 % ophthalmic solution latanoprost 0.005 % eye drops    . Multiple Vitamin (MULTIVITAMIN WITH MINERALS) TABS tablet Take 1 tablet by mouth daily.    [name] triamterene-hydrochlorothiazide (DYAZIDE) 37.5-25 MG capsule Take 1 each (1 capsule total) every morning by mouth. 90 capsule 3  . zolpidem (AMBIEN) 10 MG tablet Take 1 tablet (10 mg total) by mouth at bedtime as needed. for sleep 90 tablet 0   No facility-administered medications prior to visit.     ROS: Review of Systems  Constitutional: Negative for appetite change, fatigue and unexpected weight change.  HENT: Positive for hearing loss. Negative for congestion, ear pain, nosebleeds, sneezing, sore throat and trouble swallowing.   Eyes: Negative for itching and visual disturbance.  Respiratory: Negative for cough.   Cardiovascular: Negative for chest pain, palpitations and leg swelling.  Gastrointestinal: Negative for abdominal distention, blood in stool, diarrhea and nausea.  Genitourinary: Negative for frequency and hematuria.  Musculoskeletal: Negative for back pain, gait problem, joint swelling and neck pain.  Skin: Negative for rash.  Neurological: Negative for dizziness, tremors, speech difficulty and weakness.  Psychiatric/Behavioral: Negative for agitation, dysphoric mood and sleep disturbance. The patient is not nervous/anxious.     Objective:  BP 134/88 (BP Location: Left Arm, Patient  Position: Sitting, Cuff Size: Large)   Pulse 62   Temp 98.1 F (36.7 C) (Oral)   Ht 6' 2.5" (1.892 m)   Wt 293 lb (132.9 kg)   SpO2 98%   BMI 37.12 kg/m   BP Readings from Last 3 Encounters:  07/30/18 134/88  11/03/17 (!) 148/96  12/14/16 122/70    Wt Readings from Last 3 Encounters:  07/30/18 293 lb (132.9 kg)  11/03/17 293 lb (132.9 kg)  12/14/16 290 lb (131.5 kg)    Physical Exam  Constitutional: He appears well-developed and well-nourished.  HENT:  Head: Normocephalic and atraumatic.  Nose: Nose normal.  Eyes: Pupils are equal, round, and reactive to light.  Neck: Normal range of motion.  Wax R>L   Procedure Note :     Procedure :  Ear irrigation   Indication:  Cerumen impaction   Risks, including pain, dizziness, eardrum perforation, bleeding, infection and others as well as benefits were explained to the patient in detail. Verbal consent was obtained and the patient agreed to proceed.    We used "The Elephant Ear Irrigation Device" filled with lukewarm water for irrigation. A large amount wax was recovered. Procedure has also required manual wax removal with an ear wax curette and ear forceps.   Tolerated well. Complications: None.   Postprocedure instructions :  Call if problems.    Lab Results  Component Value Date   WBC 4.2 11/03/2017   HGB 14.4 11/03/2017   HCT 43.7 11/03/2017   PLT 254.0 11/03/2017   GLUCOSE 81 11/03/2017   CHOL 180 11/03/2017   TRIG 135.0 11/03/2017   HDL 38.40 (L) 11/03/2017  LDLCALC 115 (H) 11/03/2017   ALT 17 11/03/2017   AST 15 11/03/2017   NA 138 11/03/2017   K 4.3 11/03/2017   CL 101 11/03/2017   CREATININE 0.89 11/03/2017   BUN 10 11/03/2017   CO2 31 11/03/2017   TSH 1.30 11/03/2017   PSA 0.41 11/21/2014    Dg Lumbar Spine 2-3 Views  Result Date: 09/15/2015 CLINICAL DATA:  47 year old male with herniated nucleus pulposis of the lumbar spine. Preoperative radiograph prior to surgery. EXAM: LUMBAR SPINE - 2-3  VIEW COMPARISON:  None. FINDINGS: Transitional anatomy with sacralization of the L5 vertebral body. No evidence of acute fracture or malalignment. Mild loss of disc space height at L4-L5. Unremarkable bowel gas pattern. Normal bony mineralization without evidence of lytic or sclerotic lesion. IMPRESSION: 1. Transitional anatomy with sacralization at L5. 2. Loss of disc space height at L4-L5 consistent with degenerative disc disease. Electronically Signed   By: Jacqulynn Cadet M.D.   On: 09/15/2015 17:00   Dg Spine Portable 1 View  Result Date: 09/16/2015 CLINICAL DATA:  Surgical level L5-S1. Dr Tonita Cong request the spaces between the vertebrae to be #. EXAM: PORTABLE SPINE - 1 VIEW COMPARISON:  Earlier the same date.  Radiographs 09/15/2015. FINDINGS: 1238 hours. Same numbering applied with transitional partially sacralized L5 segment. The skin spreaders are present posteriorly at the L5 level. Blunt surgical instrument is directed towards the posterior aspect of the L4-5 disc space. IMPRESSION: Surgical instruments at the L4-5 disc space level. Note the patient has transitional anatomy with L5 designated the transitional segment. Correlation with prior cross-sectional imaging recommended. Electronically Signed   By: Richardean Sale M.D.   On: 09/16/2015 12:54   Dg Spine Portable 1 View  Result Date: 09/16/2015 CLINICAL DATA:  Surgical level L5-S1. Request spinous processes be numbered EXAM: PORTABLE SPINE - 1 VIEW COMPARISON:  Plain film from earlier same day and lumbar spine plain film dated 09/15/2015. FINDINGS: A single lateral projection of the lumbosacral spine, intraoperative, is provided. Based on the numbering on the AP and lateral lumbar spine exam of 09/15/2015, surgical hardware overlies the L5 spinous process. IMPRESSION: Based on the numbering of the lumbar spine on exam of 09/15/2015, surgical hardware overlies the L5 spinous process. Electronically Signed   By: Franki Cabot M.D.   On:  09/16/2015 12:09   Dg Spine Portable 1 View  Result Date: 09/16/2015 CLINICAL DATA:  Surgical level L5-S1. EXAM: PORTABLE SPINE - 1 VIEW COMPARISON:  09/15/2015 FINDINGS: Transitional anatomy at the lumbosacral junction. Numbering in the same fashion as on preoperative plain films, the posterior needles are directed at the L5 vertebral body and L4 vertebral body. IMPRESSION: Transitional anatomy. Numbering follows preoperative plain films. Intraoperative localization as above. Care should be taken to correlate closely with any outside cross-sectional imaging. Electronically Signed   By: Rolm Baptise M.D.   On: 09/16/2015 11:53    Assessment & Plan:   There are no diagnoses linked to this encounter.   No orders of the defined types were placed in this encounter.    Follow-up: No follow-ups on file.  Walker Kehr, MD

## 2018-07-30 NOTE — Assessment & Plan Note (Signed)
Cialis

## 2018-08-22 ENCOUNTER — Telehealth: Payer: Self-pay | Admitting: Internal Medicine

## 2018-08-22 MED ORDER — POLYMYXIN B-TRIMETHOPRIM 10000-0.1 UNIT/ML-% OP SOLN: 1.0000 [drp] | OPHTHALMIC | 0 refills | Status: DC

## 2018-08-22 NOTE — Telephone Encounter (Signed)
Rx-latanoprost (XALATAN) 0.005 % ophthalmic solution

## 2018-08-22 NOTE — Telephone Encounter (Signed)
Please disregard med below, please advise about pink eye medication.

## 2018-08-22 NOTE — Telephone Encounter (Signed)
Copied from [place] [id number]. Topic: General - Other >> Aug 22, 2018  8:21 AM Alfredia Ferguson R wrote: Pt called in and stated that he has pink eye and he gets it every 54mo and he calls in and a eye drop prescription is sent to his pharmacy. Pt doesn't know name of drops and doesn't want to schedule an appt  Cb# [phone]

## 2018-08-22 NOTE — Telephone Encounter (Signed)
Ok Polytrim gtt Thx

## 2018-11-30 ENCOUNTER — Other Ambulatory Visit: Payer: Self-pay | Admitting: Internal Medicine

## 2018-12-01 ENCOUNTER — Other Ambulatory Visit: Payer: Self-pay | Admitting: Internal Medicine

## 2018-12-03 DIAGNOSIS — Z0100 Encounter for examination of eyes and vision without abnormal findings: Secondary | ICD-10-CM | POA: Diagnosis not present

## 2018-12-25 DIAGNOSIS — H401131 Primary open-angle glaucoma, bilateral, mild stage: Secondary | ICD-10-CM | POA: Diagnosis not present

## 2019-01-31 ENCOUNTER — Encounter: Payer: Self-pay | Admitting: Internal Medicine

## 2019-01-31 ENCOUNTER — Ambulatory Visit: Payer: 59 | Admitting: Internal Medicine

## 2019-01-31 VITALS — BP 126/84 | HR 97 | Temp 97.9°F | Ht 74.5 in | Wt 286.0 lb

## 2019-01-31 DIAGNOSIS — R6889 Other general symptoms and signs: Secondary | ICD-10-CM

## 2019-01-31 DIAGNOSIS — J399 Disease of upper respiratory tract, unspecified: Secondary | ICD-10-CM | POA: Diagnosis not present

## 2019-01-31 MED ORDER — AZITHROMYCIN 250 MG PO TABS: ORAL_TABLET | ORAL | 0 refills | Status: DC

## 2019-01-31 NOTE — Progress Notes (Signed)
Subjective:  Patient ID: Nicholas Stevens, male    DOB: [birth year]  Age: 48 y.o. MRN: [id number]  CC: No chief complaint on file.   HPI [name] presents for URI sx's since Sat C/o weakness, mild cough. C/o green sinus d/c T101 today.   Outpatient Medications Prior to Visit  Medication Sig Dispense Refill  . Cholecalciferol (VITAMIN D3) 2000 units capsule Take 1 capsule (2,000 Units total) daily by mouth. 100 capsule 3  . latanoprost (XALATAN) 0.005 % ophthalmic solution latanoprost 0.005 % eye drops    . Multiple Vitamin (MULTIVITAMIN WITH MINERALS) TABS tablet Take 1 tablet by mouth daily.    [name] triamterene-hydrochlorothiazide (DYAZIDE) 37.5-25 MG capsule TAKE ONE CAPSULE EVERY MORNING BY MOUTH. 30 capsule 11  . trimethoprim-polymyxin b (POLYTRIM) ophthalmic solution Place 1 drop into both eyes every 4 (four) hours. (in the affected eye or eyes) for 4-5 days 10 mL 0  . zolpidem (AMBIEN) 10 MG tablet TAKE 1 TABLET BY MOUTH AT BEDTIME AS NEEDED FOR SLEEP 30 tablet 3  . tadalafil (CIALIS) 20 MG tablet Take 1 tablet (20 mg total) by mouth daily as needed for erectile dysfunction. 12 tablet 11   No facility-administered medications prior to visit.     ROS: Review of Systems  Constitutional: Positive for chills, diaphoresis, fatigue and fever. Negative for appetite change and unexpected weight change.  HENT: Positive for postnasal drip, rhinorrhea, sinus pressure, sinus pain and sore throat. Negative for congestion, nosebleeds, sneezing and trouble swallowing.   Eyes: Negative for itching and visual disturbance.  Respiratory: Negative for cough.   Cardiovascular: Negative for chest pain, palpitations and leg swelling.  Gastrointestinal: Negative for abdominal distention, blood in stool, diarrhea and nausea.  Genitourinary: Negative for frequency and hematuria.  Musculoskeletal: Positive for arthralgias and myalgias. Negative for back pain, gait problem, joint swelling  and neck pain.  Skin: Negative for rash.  Neurological: Negative for dizziness, tremors, speech difficulty and weakness.  Psychiatric/Behavioral: Negative for agitation, dysphoric mood and sleep disturbance. The patient is not nervous/anxious.     Objective:  BP 126/84 (BP Location: Left Arm, Patient Position: Sitting, Cuff Size: Large)   Pulse 97   Temp 97.9 F (36.6 C) (Oral)   Ht 6' 2.5" (1.892 m)   Wt 286 lb (129.7 kg)   SpO2 97%   BMI 36.23 kg/m   BP Readings from Last 3 Encounters:  01/31/19 126/84  07/30/18 134/88  11/03/17 (!) 148/96    Wt Readings from Last 3 Encounters:  01/31/19 286 lb (129.7 kg)  07/30/18 293 lb (132.9 kg)  11/03/17 293 lb (132.9 kg)    Physical Exam Constitutional:      General: He is not in acute distress.    Appearance: Normal appearance. He is well-developed. He is diaphoretic. He is not ill-appearing or toxic-appearing.     Comments: NAD  HENT:     Right Ear: There is no impacted cerumen.     Left Ear: There is no impacted cerumen.     Nose: Congestion and rhinorrhea present.     Mouth/Throat:     Pharynx: Posterior oropharyngeal erythema present.  Eyes:     Conjunctiva/sclera: Conjunctivae normal.     Pupils: Pupils are equal, round, and reactive to light.  Neck:     Musculoskeletal: Normal range of motion.     Thyroid: No thyromegaly.     Vascular: No JVD.  Cardiovascular:     Rate and Rhythm: Normal rate and regular rhythm.  Heart sounds: Normal heart sounds. No murmur. No friction rub. No gallop.   Pulmonary:     Effort: Pulmonary effort is normal. No respiratory distress.     Breath sounds: Normal breath sounds. No wheezing or rales.  Chest:     Chest wall: No tenderness.  Abdominal:     General: Bowel sounds are normal. There is no distension.     Palpations: Abdomen is soft. There is no mass.     Tenderness: There is no abdominal tenderness. There is no guarding or rebound.  Musculoskeletal: Normal range of motion.         General: No tenderness.  Lymphadenopathy:     Cervical: No cervical adenopathy.  Skin:    General: Skin is warm.     Findings: No rash.  Neurological:     Mental Status: He is alert and oriented to person, place, and time.     Cranial Nerves: No cranial nerve deficit.     Motor: No abnormal muscle tone.     Coordination: Coordination normal.     Gait: Gait normal.     Deep Tendon Reflexes: Reflexes are normal and symmetric.  Psychiatric:        Behavior: Behavior normal.        Thought Content: Thought content normal.        Judgment: Judgment normal.     Lab Results  Component Value Date   WBC 4.2 11/03/2017   HGB 14.4 11/03/2017   HCT 43.7 11/03/2017   PLT 254.0 11/03/2017   GLUCOSE 81 11/03/2017   CHOL 180 11/03/2017   TRIG 135.0 11/03/2017   HDL 38.40 (L) 11/03/2017   LDLCALC 115 (H) 11/03/2017   ALT 17 11/03/2017   AST 15 11/03/2017   NA 138 11/03/2017   K 4.3 11/03/2017   CL 101 11/03/2017   CREATININE 0.89 11/03/2017   BUN 10 11/03/2017   CO2 31 11/03/2017   TSH 1.30 11/03/2017   PSA 0.41 11/21/2014    Dg Lumbar Spine 2-3 Views  Result Date: 09/15/2015 CLINICAL DATA:  48 year old male with herniated nucleus pulposis of the lumbar spine. Preoperative radiograph prior to surgery. EXAM: LUMBAR SPINE - 2-3 VIEW COMPARISON:  None. FINDINGS: Transitional anatomy with sacralization of the L5 vertebral body. No evidence of acute fracture or malalignment. Mild loss of disc space height at L4-L5. Unremarkable bowel gas pattern. Normal bony mineralization without evidence of lytic or sclerotic lesion. IMPRESSION: 1. Transitional anatomy with sacralization at L5. 2. Loss of disc space height at L4-L5 consistent with degenerative disc disease. Electronically Signed   By: Jacqulynn Cadet M.D.   On: 09/15/2015 17:00   Dg Spine Portable 1 View  Result Date: 09/16/2015 CLINICAL DATA:  Surgical level L5-S1. Dr Tonita Cong request the spaces between the vertebrae to be #.  EXAM: PORTABLE SPINE - 1 VIEW COMPARISON:  Earlier the same date.  Radiographs 09/15/2015. FINDINGS: 1238 hours. Same numbering applied with transitional partially sacralized L5 segment. The skin spreaders are present posteriorly at the L5 level. Blunt surgical instrument is directed towards the posterior aspect of the L4-5 disc space. IMPRESSION: Surgical instruments at the L4-5 disc space level. Note the patient has transitional anatomy with L5 designated the transitional segment. Correlation with prior cross-sectional imaging recommended. Electronically Signed   By: Richardean Sale M.D.   On: 09/16/2015 12:54   Dg Spine Portable 1 View  Result Date: 09/16/2015 CLINICAL DATA:  Surgical level L5-S1. Request spinous processes be numbered EXAM: PORTABLE SPINE -  1 VIEW COMPARISON:  Plain film from earlier same day and lumbar spine plain film dated 09/15/2015. FINDINGS: A single lateral projection of the lumbosacral spine, intraoperative, is provided. Based on the numbering on the AP and lateral lumbar spine exam of 09/15/2015, surgical hardware overlies the L5 spinous process. IMPRESSION: Based on the numbering of the lumbar spine on exam of 09/15/2015, surgical hardware overlies the L5 spinous process. Electronically Signed   By: Franki Cabot M.D.   On: 09/16/2015 12:09   Dg Spine Portable 1 View  Result Date: 09/16/2015 CLINICAL DATA:  Surgical level L5-S1. EXAM: PORTABLE SPINE - 1 VIEW COMPARISON:  09/15/2015 FINDINGS: Transitional anatomy at the lumbosacral junction. Numbering in the same fashion as on preoperative plain films, the posterior needles are directed at the L5 vertebral body and L4 vertebral body. IMPRESSION: Transitional anatomy. Numbering follows preoperative plain films. Intraoperative localization as above. Care should be taken to correlate closely with any outside cross-sectional imaging. Electronically Signed   By: Rolm Baptise M.D.   On: 09/16/2015 11:53    Assessment & Plan:    Diagnoses and all orders for this visit:  Need for influenza vaccination -     POC Influenza A&B (Binax test)     No orders of the defined types were placed in this encounter.    Follow-up: No follow-ups on file.  Walker Kehr, MD

## 2019-01-31 NOTE — Assessment & Plan Note (Addendum)
Viral URI complicated by acute sinusitis Zpac Flu test (-)

## 2019-01-31 NOTE — Patient Instructions (Signed)
Viral URI complicated by acute sinusitis  You can use over-the-counter  "cold" medicines  such as "Afrin" nasal spray for nasal congestion as directed. Use " Delsym" or" Robitussin" cough syrup varietis for cough.  You can use plain "Tylenol" or "Advil" for fever, chills and achyness. Use Halls or Ricola cough drops.     Please, make an appointment if you are not better or if you're worse.

## 2019-02-19 LAB — POC INFLUENZA A&B (BINAX/QUICKVUE)
Influenza A, POC: NEGATIVE
Influenza B, POC: NEGATIVE

## 2019-04-01 DIAGNOSIS — M5416 Radiculopathy, lumbar region: Secondary | ICD-10-CM | POA: Diagnosis not present

## 2019-04-01 DIAGNOSIS — M545 Low back pain: Secondary | ICD-10-CM | POA: Diagnosis not present

## 2019-04-01 DIAGNOSIS — M519 Unspecified thoracic, thoracolumbar and lumbosacral intervertebral disc disorder: Secondary | ICD-10-CM | POA: Diagnosis not present

## 2019-04-03 DIAGNOSIS — M5416 Radiculopathy, lumbar region: Secondary | ICD-10-CM | POA: Diagnosis not present

## 2019-04-17 ENCOUNTER — Telehealth: Payer: Self-pay

## 2019-04-17 DIAGNOSIS — R5383 Other fatigue: Secondary | ICD-10-CM

## 2019-04-17 NOTE — Telephone Encounter (Signed)
LABS ORDERED [place] vov Thx

## 2019-04-17 NOTE — Telephone Encounter (Signed)
Okay to order labs or do you want VOV?  Copied from [place] [id number]. Topic: General - Inquiry >> Apr 17, 2019  2:09 PM Selinda Flavin B, NT wrote: Reason for CRM: Patient calling and states that he has had issues with low testosterone before and was able to change his diet and his levels went back up. Patient states that he is worried that his levels are going back down due to him feeling "sluggish". Would like to know if Dr Alain Marion could place lab orders to get his testosterone?

## 2019-04-18 ENCOUNTER — Ambulatory Visit (INDEPENDENT_AMBULATORY_CARE_PROVIDER_SITE_OTHER): Payer: 59 | Admitting: Internal Medicine

## 2019-04-18 ENCOUNTER — Encounter: Payer: Self-pay | Admitting: Internal Medicine

## 2019-04-18 DIAGNOSIS — N529 Male erectile dysfunction, unspecified: Secondary | ICD-10-CM | POA: Diagnosis not present

## 2019-04-18 DIAGNOSIS — E291 Testicular hypofunction: Secondary | ICD-10-CM | POA: Diagnosis not present

## 2019-04-18 DIAGNOSIS — I10 Essential (primary) hypertension: Secondary | ICD-10-CM | POA: Diagnosis not present

## 2019-04-18 NOTE — Progress Notes (Signed)
Virtual Visit via Telephone Note  I connected with Nicholas Stevens on 04/18/19 at  1:40 PM EDT by telephone and verified that I am speaking with the correct person using two identifiers.   I discussed the limitations, risks, security and privacy concerns of performing an evaluation and management service by telephone and the availability of in person appointments. I also discussed with the patient that there may be a patient responsible charge related to this service. The patient expressed understanding and agreed to proceed.   History of Present Illness:   The patient is a concern of low testosterone level.  He has been feeling tired, problems with erection, decreased exercise tolerance.  He has been on a low-carb diet and lost 8 pounds.  He had a problem with low back pain 2 weeks ago after a round of golf.  He saw Dr.Bean.  He had therapy and a course of prednisone-feeling much better.  He is status post back surgery Observations/Objective:  We had an initial connection.  Then the connection was lost on my end.  In no acute distress Assessment and Plan:  See plan Follow Up Instructions:    I discussed the assessment and treatment plan with the patient. The patient was provided an opportunity to ask questions and all were answered. The patient agreed with the plan and demonstrated an understanding of the instructions.   The patient was advised to call back or seek an in-person evaluation if the symptoms worsen or if the condition fails to improve as anticipated.  I provided 20 minutes of non-face-to-face time during this encounter.   Walker Kehr, MD

## 2019-04-18 NOTE — Assessment & Plan Note (Signed)
Will check testosterone level.  Continue with Cialis as needed

## 2019-04-18 NOTE — Assessment & Plan Note (Signed)
Continue with Maxide and weight loss

## 2019-04-18 NOTE — Telephone Encounter (Signed)
Pt notified, VOV made for today

## 2019-04-18 NOTE — Assessment & Plan Note (Signed)
Obtain lab work.  Continue with low carb diet, exercise, weight loss

## 2019-04-19 ENCOUNTER — Other Ambulatory Visit (INDEPENDENT_AMBULATORY_CARE_PROVIDER_SITE_OTHER): Payer: 59

## 2019-04-19 DIAGNOSIS — R5383 Other fatigue: Secondary | ICD-10-CM | POA: Diagnosis not present

## 2019-04-19 LAB — CBC WITH DIFFERENTIAL/PLATELET
Basophils Absolute: 0 10*3/uL (ref 0.0–0.1)
Basophils Relative: 0.6 % (ref 0.0–3.0)
Eosinophils Absolute: 0.1 10*3/uL (ref 0.0–0.7)
Eosinophils Relative: 2.2 % (ref 0.0–5.0)
HCT: 41 % (ref 39.0–52.0)
Hemoglobin: 13.7 g/dL (ref 13.0–17.0)
Lymphocytes Relative: 39.2 % (ref 12.0–46.0)
Lymphs Abs: 1.6 10*3/uL (ref 0.7–4.0)
MCHC: 33.5 g/dL (ref 30.0–36.0)
MCV: 82.1 fl (ref 78.0–100.0)
Monocytes Absolute: 0.3 10*3/uL (ref 0.1–1.0)
Monocytes Relative: 6.8 % (ref 3.0–12.0)
Neutro Abs: 2.1 10*3/uL (ref 1.4–7.7)
Neutrophils Relative %: 51.2 % (ref 43.0–77.0)
Platelets: 260 10*3/uL (ref 150.0–400.0)
RBC: 5 Mil/uL (ref 4.22–5.81)
RDW: 15.4 % (ref 11.5–15.5)
WBC: 4.1 10*3/uL (ref 4.0–10.5)

## 2019-04-19 LAB — BASIC METABOLIC PANEL
BUN: 12 mg/dL (ref 6–23)
CO2: 31 mEq/L (ref 19–32)
Calcium: 9.5 mg/dL (ref 8.4–10.5)
Chloride: 101 mEq/L (ref 96–112)
Creatinine, Ser: 1.03 mg/dL (ref 0.40–1.50)
GFR: 92.69 mL/min (ref >60.00)
Glucose, Bld: 93 mg/dL (ref 70–99)
Potassium: 3.8 mEq/L (ref 3.5–5.1)
Sodium: 138 mEq/L (ref 135–145)

## 2019-04-19 LAB — TSH: TSH: 1.06 u[IU]/mL (ref 0.35–4.50)

## 2019-04-19 LAB — HEPATIC FUNCTION PANEL
ALT: 20 U/L (ref 0–53)
AST: 14 U/L (ref 0–37)
Albumin: 4.5 g/dL (ref 3.5–5.2)
Alkaline Phosphatase: 57 U/L (ref 39–117)
Bilirubin, Direct: 0.1 mg/dL (ref 0.0–0.3)
Total Bilirubin: 0.7 mg/dL (ref 0.2–1.2)
Total Protein: 7.8 g/dL (ref 6.0–8.3)

## 2019-04-19 LAB — VITAMIN B12: Vitamin B-12: >1515 pg/mL — ABNORMAL HIGH (ref 211–911)

## 2019-04-19 LAB — TESTOSTERONE: Testosterone: 524.18 ng/dL (ref 300.00–890.00)

## 2019-05-18 ENCOUNTER — Other Ambulatory Visit: Payer: Self-pay | Admitting: Internal Medicine

## 2019-07-30 ENCOUNTER — Other Ambulatory Visit: Payer: Self-pay | Admitting: Internal Medicine

## 2019-11-12 ENCOUNTER — Other Ambulatory Visit: Payer: Self-pay | Admitting: Internal Medicine

## 2019-12-12 ENCOUNTER — Other Ambulatory Visit: Payer: Self-pay | Admitting: Internal Medicine

## 2020-01-01 ENCOUNTER — Other Ambulatory Visit: Payer: Self-pay

## 2020-01-01 DIAGNOSIS — Z20822 Contact with and (suspected) exposure to covid-19: Secondary | ICD-10-CM

## 2020-01-02 LAB — NOVEL CORONAVIRUS, NAA: SARS-CoV-2, NAA: NOT DETECTED

## 2020-01-10 ENCOUNTER — Other Ambulatory Visit: Payer: Self-pay | Admitting: Internal Medicine

## 2020-01-30 ENCOUNTER — Ambulatory Visit: Payer: 59 | Admitting: Internal Medicine

## 2020-01-30 ENCOUNTER — Encounter: Payer: Self-pay | Admitting: Internal Medicine

## 2020-01-30 ENCOUNTER — Other Ambulatory Visit: Payer: Self-pay

## 2020-01-30 VITALS — BP 124/82 | HR 99 | Temp 98.3°F | Ht 74.5 in | Wt 273.0 lb

## 2020-01-30 DIAGNOSIS — G4700 Insomnia, unspecified: Secondary | ICD-10-CM | POA: Diagnosis not present

## 2020-01-30 DIAGNOSIS — I10 Essential (primary) hypertension: Secondary | ICD-10-CM | POA: Diagnosis not present

## 2020-01-30 DIAGNOSIS — E291 Testicular hypofunction: Secondary | ICD-10-CM | POA: Diagnosis not present

## 2020-01-30 DIAGNOSIS — Z0000 Encounter for general adult medical examination without abnormal findings: Secondary | ICD-10-CM | POA: Diagnosis not present

## 2020-01-30 DIAGNOSIS — Z125 Encounter for screening for malignant neoplasm of prostate: Secondary | ICD-10-CM | POA: Diagnosis not present

## 2020-01-30 LAB — BASIC METABOLIC PANEL
BUN: 15 mg/dL (ref 6–23)
CO2: 31 mEq/L (ref 19–32)
Calcium: 10.2 mg/dL (ref 8.4–10.5)
Chloride: 101 mEq/L (ref 96–112)
Creatinine, Ser: 1.04 mg/dL (ref 0.40–1.50)
GFR: 91.37 mL/min (ref >60.00)
Glucose, Bld: 100 mg/dL — ABNORMAL HIGH (ref 70–99)
Potassium: 4.4 mEq/L (ref 3.5–5.1)
Sodium: 138 mEq/L (ref 135–145)

## 2020-01-30 LAB — LIPID PANEL
Cholesterol: 153 mg/dL (ref 0–200)
HDL: 36.3 mg/dL — ABNORMAL LOW (ref >39.00)
LDL Cholesterol: 98 mg/dL (ref 0–99)
NonHDL: 116.98
Total CHOL/HDL Ratio: 4
Triglycerides: 95 mg/dL (ref 0.0–149.0)
VLDL: 19 mg/dL (ref 0.0–40.0)

## 2020-01-30 LAB — PSA: PSA: 0.73 ng/mL (ref 0.10–4.00)

## 2020-01-30 MED ORDER — TRIAMTERENE-HCTZ 37.5-25 MG PO CAPS: 1.0000 | ORAL_CAPSULE | Freq: Every day | ORAL | 3 refills | Status: DC

## 2020-01-30 MED ORDER — ZOLPIDEM TARTRATE 10 MG PO TABS: 10.0000 mg | ORAL_TABLET | Freq: Every evening | ORAL | 1 refills | Status: DC | PRN

## 2020-01-30 NOTE — Assessment & Plan Note (Signed)
Zolpidem prn  Potential benefits of a long term benzodiazepines  use as well as potential risks  and complications were explained to the patient and were aknowledged. 

## 2020-01-30 NOTE — Patient Instructions (Addendum)
Wt Readings from Last 3 Encounters:  01/30/20 273 lb (123.8 kg)  01/31/19 286 lb (129.7 kg)  07/30/18 293 lb (132.9 kg)      These suggestions will probably help you to improve your metabolism if you are not overweight and to lose weight if you are overweight: 1.  Reduce your consumption of sugars and starches.  Eliminate high fructose corn syrup from your diet.  Reduce your consumption of processed foods.  For desserts try to have seasonal fruits, berries, nuts, cheeses or dark chocolate with more than 70% cacao. 2.  Do not snack 3.  You do not have to eat breakfast.  If you choose to have breakfast-eat plain greek yogurt, eggs, oatmeal (without sugar) 4.  Drink water, freshly brewed unsweetened tea (green, black or herbal) or coffee.  Do not drink sodas including diet sodas , juices, beverages sweetened with artificial sweeteners. 5.  Reduce your consumption of refined grains. 6.  Avoid protein drinks such as Optifast, Slim fast etc. Eat chicken, fish, meat, dairy and beans for your sources of protein 7.  Natural unprocessed fats like cold pressed virgin olive oil, butter, coconut oil are good for you.  Eat avocados 8.  Increase your consumption of fiber.  Fruits, berries, vegetables, whole grains, flaxseeds, Chia seeds, beans, popcorn, nuts, oatmeal are good sources of fiber 9.  Use vinegar in your diet, i.e. apple cider vinegar, red wine or balsamic vinegar 10.  You can try fasting.  For example you can skip breakfast and lunch every other day (24-hour fast) 11.  Stress reduction, good night sleep, relaxation, meditation, yoga and other physical activity is likely to help you to maintain low weight too. 12.  If you drink alcohol, limit your alcohol intake to no more than 2 drinks a day.     Cardiac CT calcium scoring test $150 Tel # is [phone]   Computed tomography, more commonly known as a CT or CAT scan, is a diagnostic medical imaging test. Like traditional x-rays, it  produces multiple images or pictures of the inside of the body. The cross-sectional images generated during a CT scan can be reformatted in multiple planes. They can even generate three-dimensional images. These images can be viewed on a computer monitor, printed on film or by a 3D printer, or transferred to a CD or DVD. CT images of internal organs, bones, soft tissue and blood vessels provide greater detail than traditional x-rays, particularly of soft tissues and blood vessels. A cardiac CT scan for coronary calcium is a non-invasive way of obtaining information about the presence, location and extent of calcified plaque in the coronary arteries--the vessels that supply oxygen-containing blood to the heart muscle. Calcified plaque results when there is a build-up of fat and other substances under the inner layer of the artery. This material can calcify which signals the presence of atherosclerosis, a disease of the vessel wall, also called coronary artery disease (CAD). People with this disease have an increased risk for heart attacks. In addition, over time, progression of plaque build up (CAD) can narrow the arteries or even close off blood flow to the heart. The result may be chest pain, sometimes called "angina," or a heart attack. Because calcium is a marker of CAD, the amount of calcium detected on a cardiac CT scan is a helpful prognostic tool. The findings on cardiac CT are expressed as a calcium score. Another name for this test is coronary artery calcium scoring.  What are some common uses of  the procedure? The goal of cardiac CT scan for calcium scoring is to determine if CAD is present and to what extent, even if there are no symptoms. It is a screening study that may be recommended by a physician for patients with risk factors for CAD but no clinical symptoms. The major risk factors for CAD are: . high blood cholesterol levels  . family history of heart attacks  . diabetes  . high blood  pressure  . cigarette smoking  . overweight or obese  . physical inactivity   A negative cardiac CT scan for calcium scoring shows no calcification within the coronary arteries. This suggests that CAD is absent or so minimal it cannot be seen by this technique. The chance of having a heart attack over the next two to five years is very low under these circumstances. A positive test means that CAD is present, regardless of whether or not the patient is experiencing any symptoms. The amount of calcification--expressed as the calcium score--may help to predict the likelihood of a myocardial infarction (heart attack) in the coming years and helps your medical doctor or cardiologist decide whether the patient may need to take preventive medicine or undertake other measures such as diet and exercise to lower the risk for heart attack. The extent of CAD is graded according to your calcium score:  Calcium Score  Presence of CAD (coronary artery disease)  0 No evidence of CAD   1-10 Minimal evidence of CAD  11-100 Mild evidence of CAD  101-400 Moderate evidence of CAD  Over 400 Extensive evidence of CAD

## 2020-01-30 NOTE — Progress Notes (Signed)
Subjective:  Patient ID: Nicholas Stevens, male    DOB: 06-04-1971  Age: 49 y.o. MRN: [id number]  CC: No chief complaint on file.   HPI Trejan W Kean presents for HTN, dyslipidemia, insomnia f/u Lost wt on diet/fasting/exercise  Outpatient Medications Prior to Visit  Medication Sig Dispense Refill  . Cholecalciferol (VITAMIN D3) 2000 units capsule Take 1 capsule (2,000 Units total) daily by mouth. 100 capsule 3  . latanoprost (XALATAN) 0.005 % ophthalmic solution latanoprost 0.005 % eye drops    . Multiple Vitamin (MULTIVITAMIN WITH MINERALS) TABS tablet Take 1 tablet by mouth daily.    . tadalafil (CIALIS) 20 MG tablet TAKE 1 TABLET BY MOUTH DAILY AS NEEDED FOR ERECTILE DYSFUNCTION 12 tablet 11  . triamterene-hydrochlorothiazide (DYAZIDE) 37.5-25 MG capsule Take 1 each (1 capsule total) by mouth daily. Office visit needed before refills will be given 30 capsule 0  . trimethoprim-polymyxin b (POLYTRIM) ophthalmic solution Place 1 drop into both eyes every 4 (four) hours. (in the affected eye or eyes) for 4-5 days 10 mL 0  . zolpidem (AMBIEN) 10 MG tablet Take 1 tablet (10 mg total) by mouth at bedtime as needed for sleep. Office visit needed before refills will be given 30 tablet 3   No facility-administered medications prior to visit.    ROS: Review of Systems  Constitutional: Negative for appetite change, fatigue and unexpected weight change.  HENT: Negative for congestion, nosebleeds, sneezing, sore throat and trouble swallowing.   Eyes: Negative for itching and visual disturbance.  Respiratory: Negative for cough.   Cardiovascular: Negative for chest pain, palpitations and leg swelling.  Gastrointestinal: Negative for abdominal distention, blood in stool, diarrhea and nausea.  Genitourinary: Negative for frequency and hematuria.  Musculoskeletal: Negative for back pain, gait problem, joint swelling and neck pain.  Skin: Negative for rash.  Neurological: Negative for  dizziness, tremors, speech difficulty and weakness.  Psychiatric/Behavioral: Negative for agitation, dysphoric mood and sleep disturbance. The patient is not nervous/anxious.     Objective:  BP 124/82 (BP Location: Left Arm, Patient Position: Sitting, Cuff Size: Large)   Pulse 99   Temp 98.3 F (36.8 C) (Oral)   Ht 6' 2.5" (1.892 m)   Wt 273 lb (123.8 kg)   SpO2 99%   BMI 34.58 kg/m   BP Readings from Last 3 Encounters:  01/30/20 124/82  01/31/19 126/84  07/30/18 134/88    Wt Readings from Last 3 Encounters:  01/30/20 273 lb (123.8 kg)  01/31/19 286 lb (129.7 kg)  07/30/18 293 lb (132.9 kg)    Physical Exam Constitutional:      General: He is not in acute distress.    Appearance: He is well-developed.     Comments: NAD  Eyes:     Conjunctiva/sclera: Conjunctivae normal.     Pupils: Pupils are equal, round, and reactive to light.  Neck:     Thyroid: No thyromegaly.     Vascular: No JVD.  Cardiovascular:     Rate and Rhythm: Normal rate and regular rhythm.     Heart sounds: Normal heart sounds. No murmur. No friction rub. No gallop.   Pulmonary:     Effort: Pulmonary effort is normal. No respiratory distress.     Breath sounds: Normal breath sounds. No wheezing or rales.  Chest:     Chest wall: No tenderness.  Abdominal:     General: Bowel sounds are normal. There is no distension.     Palpations: Abdomen is soft. There is  no mass.     Tenderness: There is no abdominal tenderness. There is no guarding or rebound.  Musculoskeletal:        General: No tenderness. Normal range of motion.     Cervical back: Normal range of motion.  Lymphadenopathy:     Cervical: No cervical adenopathy.  Skin:    General: Skin is warm and dry.     Findings: No rash.  Neurological:     Mental Status: He is alert and oriented to person, place, and time.     Cranial Nerves: No cranial nerve deficit.     Motor: No abnormal muscle tone.     Coordination: Coordination normal.      Gait: Gait normal.     Deep Tendon Reflexes: Reflexes are normal and symmetric.  Psychiatric:        Behavior: Behavior normal.        Thought Content: Thought content normal.        Judgment: Judgment normal.     Lab Results  Component Value Date   WBC 4.1 04/19/2019   HGB 13.7 04/19/2019   HCT 41.0 04/19/2019   PLT 260.0 04/19/2019   GLUCOSE 93 04/19/2019   CHOL 180 11/03/2017   TRIG 135.0 11/03/2017   HDL 38.40 (L) 11/03/2017   LDLCALC 115 (H) 11/03/2017   ALT 20 04/19/2019   AST 14 04/19/2019   NA 138 04/19/2019   K 3.8 04/19/2019   CL 101 04/19/2019   CREATININE 1.03 04/19/2019   BUN 12 04/19/2019   CO2 31 04/19/2019   TSH 1.06 04/19/2019   PSA 0.41 11/21/2014    DG Lumbar Spine 2-3 Views  Result Date: 09/15/2015 CLINICAL DATA:  49 year old male with herniated nucleus pulposis of the lumbar spine. Preoperative radiograph prior to surgery. EXAM: LUMBAR SPINE - 2-3 VIEW COMPARISON:  None. FINDINGS: Transitional anatomy with sacralization of the L5 vertebral body. No evidence of acute fracture or malalignment. Mild loss of disc space height at L4-L5. Unremarkable bowel gas pattern. Normal bony mineralization without evidence of lytic or sclerotic lesion. IMPRESSION: 1. Transitional anatomy with sacralization at L5. 2. Loss of disc space height at L4-L5 consistent with degenerative disc disease. Electronically Signed   By: Jacqulynn Cadet M.D.   On: 09/15/2015 17:00   DG Spine Portable 1 View  Result Date: 09/16/2015 CLINICAL DATA:  Surgical level L5-S1. Dr Tonita Cong request the spaces between the vertebrae to be #. EXAM: PORTABLE SPINE - 1 VIEW COMPARISON:  Earlier the same date.  Radiographs 09/15/2015. FINDINGS: 1238 hours. Same numbering applied with transitional partially sacralized L5 segment. The skin spreaders are present posteriorly at the L5 level. Blunt surgical instrument is directed towards the posterior aspect of the L4-5 disc space. IMPRESSION: Surgical instruments  at the L4-5 disc space level. Note the patient has transitional anatomy with L5 designated the transitional segment. Correlation with prior cross-sectional imaging recommended. Electronically Signed   By: Richardean Sale M.D.   On: 09/16/2015 12:54   DG Spine Portable 1 View  Result Date: 09/16/2015 CLINICAL DATA:  Surgical level L5-S1. Request spinous processes be numbered EXAM: PORTABLE SPINE - 1 VIEW COMPARISON:  Plain film from earlier same day and lumbar spine plain film dated 09/15/2015. FINDINGS: A single lateral projection of the lumbosacral spine, intraoperative, is provided. Based on the numbering on the AP and lateral lumbar spine exam of 09/15/2015, surgical hardware overlies the L5 spinous process. IMPRESSION: Based on the numbering of the lumbar spine on exam of 09/15/2015,  surgical hardware overlies the L5 spinous process. Electronically Signed   By: Franki Cabot M.D.   On: 09/16/2015 12:09   DG Spine Portable 1 View  Result Date: 09/16/2015 CLINICAL DATA:  Surgical level L5-S1. EXAM: PORTABLE SPINE - 1 VIEW COMPARISON:  09/15/2015 FINDINGS: Transitional anatomy at the lumbosacral junction. Numbering in the same fashion as on preoperative plain films, the posterior needles are directed at the L5 vertebral body and L4 vertebral body. IMPRESSION: Transitional anatomy. Numbering follows preoperative plain films. Intraoperative localization as above. Care should be taken to correlate closely with any outside cross-sectional imaging. Electronically Signed   By: Rolm Baptise M.D.   On: 09/16/2015 11:53    Assessment & Plan:   There are no diagnoses linked to this encounter.   No orders of the defined types were placed in this encounter.    Follow-up: No follow-ups on file.  Walker Kehr, MD

## 2020-01-30 NOTE — Assessment & Plan Note (Addendum)
Triamt HCT  Lost wt on diet/fasting/exercise

## 2020-01-30 NOTE — Assessment & Plan Note (Signed)
Lost wt on diet/fasting/exercise

## 2020-02-14 ENCOUNTER — Ambulatory Visit: Payer: 59 | Attending: Internal Medicine

## 2020-02-14 DIAGNOSIS — Z23 Encounter for immunization: Secondary | ICD-10-CM | POA: Insufficient documentation

## 2020-02-14 NOTE — Progress Notes (Signed)
   Covid-19 Vaccination Clinic  Name:  Nicholas Stevens    MRN: [id number] DOB: 1971/11/10  02/14/2020  Mr. Nicholas Stevens was observed post Covid-19 immunization for 15 minutes without incidence. He was provided with Vaccine Information Sheet and instruction to access the V-Safe system.   Mr. Nicholas Stevens was instructed to call 911 with any severe reactions post vaccine: [name] Difficulty breathing  . Swelling of your face and throat  . A fast heartbeat  . A bad rash all over your body  . Dizziness and weakness    Immunizations Administered    Name Date Dose VIS Date Route   Pfizer COVID-19 Vaccine 02/14/2020 11:10 AM 0.3 mL 12/06/2019 Intramuscular   Manufacturer: [place]   Lot: [id number]   [place]: [id number]

## 2020-02-21 ENCOUNTER — Ambulatory Visit (INDEPENDENT_AMBULATORY_CARE_PROVIDER_SITE_OTHER): Payer: 59 | Admitting: Family

## 2020-02-21 DIAGNOSIS — J019 Acute sinusitis, unspecified: Secondary | ICD-10-CM

## 2020-02-21 DIAGNOSIS — R05 Cough: Secondary | ICD-10-CM

## 2020-02-21 DIAGNOSIS — R058 Other specified cough: Secondary | ICD-10-CM

## 2020-02-21 MED ORDER — FLUTICASONE PROPIONATE 50 MCG/ACT NA SUSP: 2.0000 | Freq: Every day | NASAL | 6 refills | Status: DC

## 2020-02-21 MED ORDER — AMOXICILLIN-POT CLAVULANATE 875-125 MG PO TABS: 1.0000 | ORAL_TABLET | Freq: Two times a day (BID) | ORAL | 0 refills | Status: AC

## 2020-02-21 MED ORDER — BENZONATATE 200 MG PO CAPS: 200.0000 mg | ORAL_CAPSULE | Freq: Three times a day (TID) | ORAL | 0 refills | Status: DC | PRN

## 2020-02-21 NOTE — Progress Notes (Signed)
Nicholas Stevens is a 49 y.o. male with the following history as recorded in Epic[employer]:  Patient Active Problem List   Diagnosis Date Noted  . Upper respiratory disease 01/31/2019  . Cerumen impaction 07/30/2018  . Ear discomfort 07/30/2018  . HTN (hypertension) 11/03/2017  . Acute conjunctivitis 12/14/2016  . Spinal stenosis of lumbar region 09/16/2015  . Insomnia 04/15/2015  . Well adult exam 11/04/2014  . Laparoscopic sleeve gastrectomy Oct 2014 10/08/2013  . Hypogonadism, male 07/17/2013  . Erectile dysfunction 03/22/2013  . Concentration deficit 04/27/2011  . TINEA BARBAE 03/04/2009  . LIVER HEMANGIOMA 03/04/2009  . OVERWEIGHT-BMI 42 03/04/2009  . Obstructive sleep apnea 03/04/2009  . KNEE PAIN 03/04/2009  . DEGENERATIVE DISC DISEASE, LUMBAR SPINE 03/04/2009    Current Outpatient Medications  Medication Sig Dispense Refill  . amoxicillin-clavulanate (AUGMENTIN) 875-125 MG tablet Take 1 tablet by mouth 2 (two) times daily for 10 days. 20 tablet 0  . benzonatate (TESSALON) 200 MG capsule Take 1 capsule (200 mg total) by mouth 3 (three) times daily as needed. 30 capsule 0  . Cholecalciferol (VITAMIN D3) 2000 units capsule Take 1 capsule (2,000 Units total) daily by mouth. 100 capsule 3  . fluticasone (FLONASE) 50 MCG/ACT nasal spray Place 2 sprays into both nostrils daily. 16 g 6  . latanoprost (XALATAN) 0.005 % ophthalmic solution latanoprost 0.005 % eye drops    . Multiple Vitamin (MULTIVITAMIN WITH MINERALS) TABS tablet Take 1 tablet by mouth daily.    . tadalafil (CIALIS) 20 MG tablet TAKE 1 TABLET BY MOUTH DAILY AS NEEDED FOR ERECTILE DYSFUNCTION 12 tablet 11  . triamterene-hydrochlorothiazide (DYAZIDE) 37.5-25 MG capsule Take 1 each (1 capsule total) by mouth daily. Office visit needed before refills will be given 90 capsule 3  . trimethoprim-polymyxin b (POLYTRIM) ophthalmic solution Place 1 drop into both eyes every 4 (four) hours. (in the affected eye or eyes) for 4-5  days 10 mL 0  . zolpidem (AMBIEN) 10 MG tablet Take 1 tablet (10 mg total) by mouth at bedtime as needed for sleep. Office visit needed before refills will be given 90 tablet 1   No current facility-administered medications for this visit.    Allergies: Patient has no known allergies.  Past Medical History:  Diagnosis Date  . DDD (degenerative disc disease), lumbar   . HNP (herniated nucleus pulposus)   . Hypertension   . Liver hemangioma   . Morbid obesity ([place])   . OSA (obstructive sleep apnea)    no cpap used since weight loss  . Overweight(278.02)   . Plantar fasciitis of right foot   . Tinea barbae     Past Surgical History:  Procedure Laterality Date  . BREATH TEK H PYLORI N/A 08/20/2013   Procedure: BREATH TEK H PYLORI;  Surgeon: Pedro Earls, MD;  Location: [facility];  Service: General;  Laterality: N/A;  . LAPAROSCOPIC APPENDECTOMY  2007   Dr Marlou Starks  . LAPAROSCOPIC GASTRIC SLEEVE RESECTION N/A 10/08/2013   Procedure: LAPAROSCOPIC SLEEVE GASTRECTOMY;  Surgeon: Pedro Earls, MD;  Location: [facility];  Service: General;  Laterality: N/A;  . LUMBAR LAMINECTOMY/DECOMPRESSION MICRODISCECTOMY N/A 09/16/2015   Procedure: MICRO LUMBAR DECOMPRESSION, MICRODISCECTOMY L5 - S1;  Surgeon: Susa Day, MD;  Location: [facility];  Service: Orthopedics;  Laterality: N/A;  . ROTATOR CUFF REPAIR Right 2005   Dr Sharol Given    Family History  Problem Relation Age of Onset  . Liver disease Father        ETOH, Hep  C  . Hypertension Father   . Other Mother        hx of DJD    Social History   Tobacco Use  . Smoking status: Never Smoker  . Smokeless tobacco: Never Used  Substance Use Topics  . Alcohol use: No    Subjective:    I connected with Nicholas Stevens on 02/21/20 at  3:00 PM EST by a video enabled telemedicine application and verified that I am speaking with the correct person using two identifiers.   I discussed the limitations of evaluation and management by  telemedicine and the availability of in person appointments. The patient expressed understanding and agreed to proceed.  Provider in office/ patient is in his car; provider and patient are only 2 people on video call.   5-6 week history of dry cough; "ticking sensation in the back of my throat." Saw his PCP earlier this month but was using Tessalon Perles and getting relief; Did not remember to mention to his provider at time of check up; However is concerned due to persisting symptoms;  Sleeping okay; no chest pain or shortness of breath; wife had COVID in January; no fevers; no history of asthma, allergies or acid reflux;    Objective:  There were no vitals filed for this visit.  General: Well developed, well nourished, in no acute distress  Head: Normocephalic and atraumatic  Lungs: Respirations unlabored;  Neurologic: Alert and oriented; speech intact; face symmetrical;   Assessment:  1. Acute sinusitis, recurrence not specified, unspecified location   2. Cough present for greater than 3 weeks     Plan:  Will plan to treat for underlying sinus infection/ suspected PND; however, if symptoms persist x 1 more week- he will need to be seen in the office and plan to get a CXR; patient agrees; Rx for Augmentin 875 mg bid x 10 days, Tessalon 200 mg tid, Flonase NS; increase fluids, rest and follow-up worse, no better.   No follow-ups on file.  No orders of the defined types were placed in this encounter.   Requested Prescriptions   Signed Prescriptions Disp Refills  . amoxicillin-clavulanate (AUGMENTIN) 875-125 MG tablet 20 tablet 0    Sig: Take 1 tablet by mouth 2 (two) times daily for 10 days.  . benzonatate (TESSALON) 200 MG capsule 30 capsule 0    Sig: Take 1 capsule (200 mg total) by mouth 3 (three) times daily as needed.  . fluticasone (FLONASE) 50 MCG/ACT nasal spray 16 g 6    Sig: Place 2 sprays into both nostrils daily.

## 2020-03-05 ENCOUNTER — Other Ambulatory Visit: Payer: Self-pay | Admitting: Family

## 2020-03-05 ENCOUNTER — Telehealth: Payer: Self-pay

## 2020-03-05 DIAGNOSIS — R053 Chronic cough: Secondary | ICD-10-CM

## 2020-03-05 DIAGNOSIS — R05 Cough: Secondary | ICD-10-CM

## 2020-03-05 NOTE — Telephone Encounter (Signed)
Order in place; will be in touch after X-ray results to discuss next steps.

## 2020-03-05 NOTE — Telephone Encounter (Signed)
Spoke with patient and info given 

## 2020-03-05 NOTE — Telephone Encounter (Signed)
New message  The patient calling asking for a chest x-ray due to a continuous cough.   Seen Jodi Mourning 2.26.21

## 2020-03-06 ENCOUNTER — Ambulatory Visit (INDEPENDENT_AMBULATORY_CARE_PROVIDER_SITE_OTHER): Payer: 59

## 2020-03-06 DIAGNOSIS — R05 Cough: Secondary | ICD-10-CM | POA: Diagnosis not present

## 2020-03-06 DIAGNOSIS — R053 Chronic cough: Secondary | ICD-10-CM

## 2020-03-09 ENCOUNTER — Other Ambulatory Visit: Payer: Self-pay | Admitting: Family

## 2020-03-09 ENCOUNTER — Telehealth: Payer: Self-pay | Admitting: Internal Medicine

## 2020-03-09 MED ORDER — PREDNISONE 20 MG PO TABS: 40.0000 mg | ORAL_TABLET | Freq: Every day | ORAL | 0 refills | Status: DC

## 2020-03-09 MED ORDER — AZITHROMYCIN 250 MG PO TABS: ORAL_TABLET | ORAL | 0 refills | Status: DC

## 2020-03-09 NOTE — Telephone Encounter (Signed)
Spoke with patient and results given. 

## 2020-03-09 NOTE — Telephone Encounter (Signed)
New Message:   Pt is calling and would like to get the results of his chest x-ray from Friday. Pt states he received a notification from the pharmacy for 2 new medications but is not sure what they are for. Mickel Baas ordered this test so that's why i'm sending this to you.

## 2020-03-10 ENCOUNTER — Ambulatory Visit: Payer: 59 | Attending: Internal Medicine

## 2020-03-10 DIAGNOSIS — Z23 Encounter for immunization: Secondary | ICD-10-CM

## 2020-03-10 NOTE — Progress Notes (Signed)
   Covid-19 Vaccination Clinic  Name:  SYMON STROHMEYER    MRN: [id number] DOB: 18-Feb-1971  03/10/2020  Mr. Kruzan was observed post Covid-19 immunization for 15 minutes without incident. He was provided with Vaccine Information Sheet and instruction to access the V-Safe system.   Mr. Fiqueroa was instructed to call 911 with any severe reactions post vaccine: [name] Difficulty breathing  . Swelling of face and throat  . A fast heartbeat  . A bad rash all over body  . Dizziness and weakness   Immunizations Administered    Name Date Dose VIS Date Route   Pfizer COVID-19 Vaccine 03/10/2020 10:26 AM 0.3 mL 12/06/2019 Intramuscular   Manufacturer: [place]   Lot: [id number]   [place]: [id number]

## 2020-03-24 ENCOUNTER — Other Ambulatory Visit: Payer: Self-pay

## 2020-03-24 ENCOUNTER — Ambulatory Visit: Payer: 59 | Admitting: Internal Medicine

## 2020-03-24 ENCOUNTER — Encounter: Payer: Self-pay | Admitting: Internal Medicine

## 2020-03-24 DIAGNOSIS — R059 Cough, unspecified: Secondary | ICD-10-CM

## 2020-03-24 DIAGNOSIS — J399 Disease of upper respiratory tract, unspecified: Secondary | ICD-10-CM

## 2020-03-24 DIAGNOSIS — R05 Cough: Secondary | ICD-10-CM | POA: Diagnosis not present

## 2020-03-24 LAB — CBC WITH DIFFERENTIAL/PLATELET
Basophils Absolute: 0 10*3/uL (ref 0.0–0.1)
Basophils Relative: 0.5 % (ref 0.0–3.0)
Eosinophils Absolute: 0.2 10*3/uL (ref 0.0–0.7)
Eosinophils Relative: 3.4 % (ref 0.0–5.0)
HCT: 37.3 % — ABNORMAL LOW (ref 39.0–52.0)
Hemoglobin: 12.5 g/dL — ABNORMAL LOW (ref 13.0–17.0)
Lymphocytes Relative: 32.5 % (ref 12.0–46.0)
Lymphs Abs: 1.7 10*3/uL (ref 0.7–4.0)
MCHC: 33.4 g/dL (ref 30.0–36.0)
MCV: 82.7 fl (ref 78.0–100.0)
Monocytes Absolute: 0.4 10*3/uL (ref 0.1–1.0)
Monocytes Relative: 7.8 % (ref 3.0–12.0)
Neutro Abs: 2.8 10*3/uL (ref 1.4–7.7)
Neutrophils Relative %: 55.8 % (ref 43.0–77.0)
Platelets: 229 10*3/uL (ref 150.0–400.0)
RBC: 4.51 Mil/uL (ref 4.22–5.81)
RDW: 15.4 % (ref 11.5–15.5)
WBC: 5.1 10*3/uL (ref 4.0–10.5)

## 2020-03-24 LAB — BASIC METABOLIC PANEL
BUN: 13 mg/dL (ref 6–23)
CO2: 32 mEq/L (ref 19–32)
Calcium: 9.4 mg/dL (ref 8.4–10.5)
Chloride: 103 mEq/L (ref 96–112)
Creatinine, Ser: 0.96 mg/dL (ref 0.40–1.50)
GFR: 100.15 mL/min (ref >60.00)
Glucose, Bld: 86 mg/dL (ref 70–99)
Potassium: 3.9 mEq/L (ref 3.5–5.1)
Sodium: 138 mEq/L (ref 135–145)

## 2020-03-24 MED ORDER — PANTOPRAZOLE SODIUM 40 MG PO TBEC: 40.0000 mg | DELAYED_RELEASE_TABLET | Freq: Every day | ORAL | 11 refills | Status: DC

## 2020-03-24 NOTE — Progress Notes (Signed)
Subjective:  Patient ID: Nicholas Stevens, male    DOB: Aug 26, 1971  Age: 49 y.o. MRN: [id number]  CC: No chief complaint on file.   HPI Nicholas Stevens presents for chronic dry cough worse before meals since Feb - not better C/o occasional cough w/mucus Finished the abx - felling better a little No SOB, no wheezing S/p sleeve procedure in 2014  Outpatient Medications Prior to Visit  Medication Sig Dispense Refill  . azithromycin (ZITHROMAX) 250 MG tablet 2 tabs po qd x 1 day; 1 tablet per day x 4 days; 6 tablet 0  . benzonatate (TESSALON) 200 MG capsule Take 1 capsule (200 mg total) by mouth 3 (three) times daily as needed. 30 capsule 0  . Cholecalciferol (VITAMIN D3) 2000 units capsule Take 1 capsule (2,000 Units total) daily by mouth. 100 capsule 3  . fluticasone (FLONASE) 50 MCG/ACT nasal spray Place 2 sprays into both nostrils daily. 16 g 6  . latanoprost (XALATAN) 0.005 % ophthalmic solution latanoprost 0.005 % eye drops    . Multiple Vitamin (MULTIVITAMIN WITH MINERALS) TABS tablet Take 1 tablet by mouth daily.    . predniSONE (DELTASONE) 20 MG tablet Take 2 tablets (40 mg total) by mouth daily with breakfast. 10 tablet 0  . tadalafil (CIALIS) 20 MG tablet TAKE 1 TABLET BY MOUTH DAILY AS NEEDED FOR ERECTILE DYSFUNCTION 12 tablet 11  . triamterene-hydrochlorothiazide (DYAZIDE) 37.5-25 MG capsule Take 1 each (1 capsule total) by mouth daily. Office visit needed before refills will be given 90 capsule 3  . trimethoprim-polymyxin b (POLYTRIM) ophthalmic solution Place 1 drop into both eyes every 4 (four) hours. (in the affected eye or eyes) for 4-5 days 10 mL 0  . zolpidem (AMBIEN) 10 MG tablet Take 1 tablet (10 mg total) by mouth at bedtime as needed for sleep. Office visit needed before refills will be given 90 tablet 1   No facility-administered medications prior to visit.    ROS: Review of Systems  Constitutional: Negative for appetite change, fatigue and unexpected  weight change.  HENT: Negative for congestion, nosebleeds, sneezing, sore throat and trouble swallowing.   Eyes: Negative for itching and visual disturbance.  Respiratory: Positive for cough. Negative for chest tightness, shortness of breath, wheezing and stridor.   Cardiovascular: Negative for chest pain, palpitations and leg swelling.  Gastrointestinal: Negative for abdominal distention, blood in stool, diarrhea and nausea.  Genitourinary: Negative for frequency and hematuria.  Musculoskeletal: Negative for back pain, gait problem, joint swelling and neck pain.  Skin: Negative for rash.  Neurological: Negative for dizziness, tremors, speech difficulty and weakness.  Psychiatric/Behavioral: Negative for agitation, dysphoric mood and sleep disturbance. The patient is not nervous/anxious.     Objective:  BP 124/80 (BP Location: Left Arm, Patient Position: Sitting, Cuff Size: Large)   Pulse 78   Temp 98.2 F (36.8 C) (Oral)   Ht 6' 2.5" (1.892 m)   Wt 287 lb (130.2 kg)   SpO2 98%   BMI 36.36 kg/m   BP Readings from Last 3 Encounters:  03/24/20 124/80  01/30/20 124/82  01/31/19 126/84    Wt Readings from Last 3 Encounters:  03/24/20 287 lb (130.2 kg)  01/30/20 273 lb (123.8 kg)  01/31/19 286 lb (129.7 kg)    Physical Exam Constitutional:      General: He is not in acute distress.    Appearance: He is well-developed.     Comments: NAD  Eyes:     Conjunctiva/sclera: Conjunctivae normal.  Pupils: Pupils are equal, round, and reactive to light.  Neck:     Thyroid: No thyromegaly.     Vascular: No JVD.  Cardiovascular:     Rate and Rhythm: Normal rate and regular rhythm.     Heart sounds: Normal heart sounds. No murmur. No friction rub. No gallop.   Pulmonary:     Effort: Pulmonary effort is normal. No respiratory distress.     Breath sounds: Normal breath sounds. No wheezing or rales.  Chest:     Chest wall: No tenderness.  Abdominal:     General: Bowel sounds are  normal. There is no distension.     Palpations: Abdomen is soft. There is no mass.     Tenderness: There is no abdominal tenderness. There is no guarding or rebound.  Musculoskeletal:        General: No tenderness. Normal range of motion.     Cervical back: Normal range of motion.  Lymphadenopathy:     Cervical: No cervical adenopathy.  Skin:    General: Skin is warm and dry.     Findings: No rash.  Neurological:     Mental Status: He is alert and oriented to person, place, and time.     Cranial Nerves: No cranial nerve deficit.     Motor: No abnormal muscle tone.     Coordination: Coordination normal.     Gait: Gait normal.     Deep Tendon Reflexes: Reflexes are normal and symmetric.  Psychiatric:        Behavior: Behavior normal.        Thought Content: Thought content normal.        Judgment: Judgment normal.     Lab Results  Component Value Date   WBC 4.1 04/19/2019   HGB 13.7 04/19/2019   HCT 41.0 04/19/2019   PLT 260.0 04/19/2019   GLUCOSE 100 (H) 01/30/2020   CHOL 153 01/30/2020   TRIG 95.0 01/30/2020   HDL 36.30 (L) 01/30/2020   LDLCALC 98 01/30/2020   ALT 20 04/19/2019   AST 14 04/19/2019   NA 138 01/30/2020   K 4.4 01/30/2020   CL 101 01/30/2020   CREATININE 1.04 01/30/2020   BUN 15 01/30/2020   CO2 31 01/30/2020   TSH 1.06 04/19/2019   PSA 0.73 01/30/2020    DG Lumbar Spine 2-3 Views  Result Date: 09/15/2015 CLINICAL DATA:  49 year old male with herniated nucleus pulposis of the lumbar spine. Preoperative radiograph prior to surgery. EXAM: LUMBAR SPINE - 2-3 VIEW COMPARISON:  None. FINDINGS: Transitional anatomy with sacralization of the L5 vertebral body. No evidence of acute fracture or malalignment. Mild loss of disc space height at L4-L5. Unremarkable bowel gas pattern. Normal bony mineralization without evidence of lytic or sclerotic lesion. IMPRESSION: 1. Transitional anatomy with sacralization at L5. 2. Loss of disc space height at L4-L5 consistent  with degenerative disc disease. Electronically Signed   By: Jacqulynn Cadet M.D.   On: 09/15/2015 17:00   DG Spine Portable 1 View  Result Date: 09/16/2015 CLINICAL DATA:  Surgical level L5-S1. Dr Tonita Cong request the spaces between the vertebrae to be #. EXAM: PORTABLE SPINE - 1 VIEW COMPARISON:  Earlier the same date.  Radiographs 09/15/2015. FINDINGS: 1238 hours. Same numbering applied with transitional partially sacralized L5 segment. The skin spreaders are present posteriorly at the L5 level. Blunt surgical instrument is directed towards the posterior aspect of the L4-5 disc space. IMPRESSION: Surgical instruments at the L4-5 disc space level. Note the patient  has transitional anatomy with L5 designated the transitional segment. Correlation with prior cross-sectional imaging recommended. Electronically Signed   By: Richardean Sale M.D.   On: 09/16/2015 12:54   DG Spine Portable 1 View  Result Date: 09/16/2015 CLINICAL DATA:  Surgical level L5-S1. Request spinous processes be numbered EXAM: PORTABLE SPINE - 1 VIEW COMPARISON:  Plain film from earlier same day and lumbar spine plain film dated 09/15/2015. FINDINGS: A single lateral projection of the lumbosacral spine, intraoperative, is provided. Based on the numbering on the AP and lateral lumbar spine exam of 09/15/2015, surgical hardware overlies the L5 spinous process. IMPRESSION: Based on the numbering of the lumbar spine on exam of 09/15/2015, surgical hardware overlies the L5 spinous process. Electronically Signed   By: Franki Cabot M.D.   On: 09/16/2015 12:09   DG Spine Portable 1 View  Result Date: 09/16/2015 CLINICAL DATA:  Surgical level L5-S1. EXAM: PORTABLE SPINE - 1 VIEW COMPARISON:  09/15/2015 FINDINGS: Transitional anatomy at the lumbosacral junction. Numbering in the same fashion as on preoperative plain films, the posterior needles are directed at the L5 vertebral body and L4 vertebral body. IMPRESSION: Transitional anatomy.  Numbering follows preoperative plain films. Intraoperative localization as above. Care should be taken to correlate closely with any outside cross-sectional imaging. Electronically Signed   By: Rolm Baptise M.D.   On: 09/16/2015 11:53    Assessment & Plan:   There are no diagnoses linked to this encounter.   No orders of the defined types were placed in this encounter.    Follow-up: No follow-ups on file.  Walker Kehr, MD

## 2020-03-27 DIAGNOSIS — R05 Cough: Secondary | ICD-10-CM | POA: Insufficient documentation

## 2020-03-27 DIAGNOSIS — R059 Cough, unspecified: Secondary | ICD-10-CM | POA: Insufficient documentation

## 2020-03-27 NOTE — Assessment & Plan Note (Signed)
Persistent cough with unusual cough pattern.  Will obtain CT scan of the chest.  Differential diagnosis is broad. Empiric Protonix daily

## 2020-03-27 NOTE — Assessment & Plan Note (Signed)
Slightly better.

## 2020-04-03 ENCOUNTER — Ambulatory Visit (INDEPENDENT_AMBULATORY_CARE_PROVIDER_SITE_OTHER)
Admission: RE | Admit: 2020-04-03 | Discharge: 2020-04-03 | Disposition: A | Discharge status: Home / Self Care | Payer: 59 | Source: Ambulatory Visit | Attending: Internal Medicine | Admitting: Internal Medicine

## 2020-04-03 ENCOUNTER — Other Ambulatory Visit: Payer: Self-pay

## 2020-04-03 DIAGNOSIS — R059 Cough, unspecified: Secondary | ICD-10-CM

## 2020-04-03 DIAGNOSIS — R05 Cough: Secondary | ICD-10-CM

## 2020-04-03 MED ORDER — IOHEXOL 300 MG/ML  SOLN
80.0000 mL | Freq: Once | INTRAMUSCULAR | Status: AC | PRN
  Administered 2020-04-03: 80 mL via INTRAVENOUS

## 2020-08-05 ENCOUNTER — Other Ambulatory Visit: Payer: Self-pay | Admitting: Internal Medicine

## 2020-08-05 MED ORDER — TADALAFIL 20 MG PO TABS: ORAL_TABLET | ORAL | 11 refills | Status: DC

## 2020-08-05 MED ORDER — ZOLPIDEM TARTRATE 10 MG PO TABS: 10.0000 mg | ORAL_TABLET | Freq: Every evening | ORAL | 0 refills | Status: DC | PRN

## 2020-10-26 ENCOUNTER — Encounter: Payer: Self-pay | Admitting: Internal Medicine

## 2020-10-26 ENCOUNTER — Other Ambulatory Visit: Payer: Self-pay

## 2020-10-26 ENCOUNTER — Other Ambulatory Visit (INDEPENDENT_AMBULATORY_CARE_PROVIDER_SITE_OTHER): Payer: 59

## 2020-10-26 ENCOUNTER — Ambulatory Visit (INDEPENDENT_AMBULATORY_CARE_PROVIDER_SITE_OTHER): Payer: 59 | Admitting: Internal Medicine

## 2020-10-26 VITALS — BP 128/84 | HR 82 | Temp 98.4°F | Ht 74.5 in | Wt 292.8 lb

## 2020-10-26 DIAGNOSIS — M7710 Lateral epicondylitis, unspecified elbow: Secondary | ICD-10-CM | POA: Insufficient documentation

## 2020-10-26 DIAGNOSIS — Z9884 Bariatric surgery status: Secondary | ICD-10-CM | POA: Diagnosis not present

## 2020-10-26 DIAGNOSIS — G4700 Insomnia, unspecified: Secondary | ICD-10-CM

## 2020-10-26 DIAGNOSIS — M25561 Pain in right knee: Secondary | ICD-10-CM

## 2020-10-26 DIAGNOSIS — M7711 Lateral epicondylitis, right elbow: Secondary | ICD-10-CM

## 2020-10-26 DIAGNOSIS — Z125 Encounter for screening for malignant neoplasm of prostate: Secondary | ICD-10-CM | POA: Diagnosis not present

## 2020-10-26 DIAGNOSIS — Z0000 Encounter for general adult medical examination without abnormal findings: Secondary | ICD-10-CM | POA: Diagnosis not present

## 2020-10-26 DIAGNOSIS — M48061 Spinal stenosis, lumbar region without neurogenic claudication: Secondary | ICD-10-CM | POA: Diagnosis not present

## 2020-10-26 DIAGNOSIS — M25562 Pain in left knee: Secondary | ICD-10-CM

## 2020-10-26 DIAGNOSIS — I10 Essential (primary) hypertension: Secondary | ICD-10-CM

## 2020-10-26 LAB — COMPREHENSIVE METABOLIC PANEL
ALT: 23 U/L (ref 0–53)
AST: 21 U/L (ref 0–37)
Albumin: 4.6 g/dL (ref 3.5–5.2)
Alkaline Phosphatase: 59 U/L (ref 39–117)
BUN: 14 mg/dL (ref 6–23)
CO2: 31 mEq/L (ref 19–32)
Calcium: 9.7 mg/dL (ref 8.4–10.5)
Chloride: 100 mEq/L (ref 96–112)
Creatinine, Ser: 0.98 mg/dL (ref 0.40–1.50)
GFR: 89.6 mL/min (ref >60.00)
Glucose, Bld: 88 mg/dL (ref 70–99)
Potassium: 3.7 mEq/L (ref 3.5–5.1)
Sodium: 139 mEq/L (ref 135–145)
Total Bilirubin: 0.4 mg/dL (ref 0.2–1.2)
Total Protein: 7.6 g/dL (ref 6.0–8.3)

## 2020-10-26 LAB — CBC WITH DIFFERENTIAL/PLATELET
Basophils Absolute: 0 10*3/uL (ref 0.0–0.1)
Basophils Relative: 0.4 % (ref 0.0–3.0)
Eosinophils Absolute: 0.1 10*3/uL (ref 0.0–0.7)
Eosinophils Relative: 2.7 % (ref 0.0–5.0)
HCT: 42.3 % (ref 39.0–52.0)
Hemoglobin: 14.3 g/dL (ref 13.0–17.0)
Lymphocytes Relative: 36.4 % (ref 12.0–46.0)
Lymphs Abs: 1.9 10*3/uL (ref 0.7–4.0)
MCHC: 33.8 g/dL (ref 30.0–36.0)
MCV: 80.8 fl (ref 78.0–100.0)
Monocytes Absolute: 0.3 10*3/uL (ref 0.1–1.0)
Monocytes Relative: 6 % (ref 3.0–12.0)
Neutro Abs: 2.8 10*3/uL (ref 1.4–7.7)
Neutrophils Relative %: 54.5 % (ref 43.0–77.0)
Platelets: 268 10*3/uL (ref 150.0–400.0)
RBC: 5.23 Mil/uL (ref 4.22–5.81)
RDW: 15.6 % — ABNORMAL HIGH (ref 11.5–15.5)
WBC: 5.2 10*3/uL (ref 4.0–10.5)

## 2020-10-26 LAB — LDL CHOLESTEROL, DIRECT: Direct LDL: 116 mg/dL

## 2020-10-26 LAB — URINALYSIS
Bilirubin Urine: NEGATIVE
Hgb urine dipstick: NEGATIVE
Ketones, ur: NEGATIVE
Leukocytes,Ua: NEGATIVE
Nitrite: NEGATIVE
Specific Gravity, Urine: 1.02 (ref 1.000–1.030)
Total Protein, Urine: NEGATIVE
Urine Glucose: NEGATIVE
Urobilinogen, UA: 0.2 (ref 0.0–1.0)
pH: 7 (ref 5.0–8.0)

## 2020-10-26 LAB — LIPID PANEL
Cholesterol: 181 mg/dL (ref 0–200)
HDL: 34.3 mg/dL — ABNORMAL LOW (ref >39.00)
Total CHOL/HDL Ratio: 5
Triglycerides: 451 mg/dL — ABNORMAL HIGH (ref 0.0–149.0)

## 2020-10-26 LAB — VITAMIN D 25 HYDROXY (VIT D DEFICIENCY, FRACTURES): VITD: 41.91 ng/mL (ref 30.00–100.00)

## 2020-10-26 LAB — VITAMIN B12: Vitamin B-12: >1526 pg/mL — ABNORMAL HIGH (ref 211–911)

## 2020-10-26 LAB — TSH: TSH: 1.05 u[IU]/mL (ref 0.35–4.50)

## 2020-10-26 LAB — PSA: PSA: 0.5 ng/mL (ref 0.10–4.00)

## 2020-10-26 MED ORDER — TRIAMTERENE-HCTZ 37.5-25 MG PO CAPS: 1.0000 | ORAL_CAPSULE | Freq: Every day | ORAL | 3 refills | Status: DC

## 2020-10-26 MED ORDER — ZOLPIDEM TARTRATE 10 MG PO TABS: 10.0000 mg | ORAL_TABLET | Freq: Every evening | ORAL | 1 refills | Status: DC | PRN

## 2020-10-26 NOTE — Assessment & Plan Note (Signed)
Zolpidem prn  Potential benefits of a long term benzodiazepines  use as well as potential risks  and complications were explained to the patient and were aknowledged. 

## 2020-10-26 NOTE — Assessment & Plan Note (Signed)
Theraband flexbar Ice - heat Voltaren gel

## 2020-10-26 NOTE — Progress Notes (Signed)
Subjective:  Patient ID: Nicholas Stevens, male    DOB: 15-Feb-1971  Age: 49 y.o. MRN: [id number]  CC: Annual Exam   HPI [name] presents for a well exam She is complaining of right elbow pain of several weeks duration Follow-up on insomnia, hypertension, status post sleeve gastrectomy  Outpatient Medications Prior to Visit  Medication Sig Dispense Refill  . Cholecalciferol (VITAMIN D3) 2000 units capsule Take 1 capsule (2,000 Units total) daily by mouth. 100 capsule 3  . latanoprost (XALATAN) 0.005 % ophthalmic solution latanoprost 0.005 % eye drops    . Multiple Vitamin (MULTIVITAMIN WITH MINERALS) TABS tablet Take 1 tablet by mouth daily.    . tadalafil (CIALIS) 20 MG tablet TAKE 1 TABLET BY MOUTH DAILY AS NEEDED FOR ERECTILE DYSFUNCTION 12 tablet 11  . triamterene-hydrochlorothiazide (DYAZIDE) 37.5-25 MG capsule Take 1 each (1 capsule total) by mouth daily. Office visit needed before refills will be given 90 capsule 3  . trimethoprim-polymyxin b (POLYTRIM) ophthalmic solution Place 1 drop into both eyes every 4 (four) hours. (in the affected eye or eyes) for 4-5 days 10 mL 0  . zolpidem (AMBIEN) 10 MG tablet Take 1 tablet (10 mg total) by mouth at bedtime as needed for sleep. Office visit needed before refills will be given 90 tablet 0  . azithromycin (ZITHROMAX) 250 MG tablet 2 tabs po qd x 1 day; 1 tablet per day x 4 days; 6 tablet 0  . benzonatate (TESSALON) 200 MG capsule Take 1 capsule (200 mg total) by mouth 3 (three) times daily as needed. 30 capsule 0  . fluticasone (FLONASE) 50 MCG/ACT nasal spray Place 2 sprays into both nostrils daily. 16 g 6  . pantoprazole (PROTONIX) 40 MG tablet Take 1 tablet (40 mg total) by mouth daily. 30 tablet 11  . predniSONE (DELTASONE) 20 MG tablet Take 2 tablets (40 mg total) by mouth daily with breakfast. 10 tablet 0   No facility-administered medications prior to visit.    ROS: Review of Systems  Constitutional: Positive  for unexpected weight change. Negative for appetite change and fatigue.  HENT: Negative for congestion, nosebleeds, sneezing, sore throat and trouble swallowing.   Eyes: Negative for itching and visual disturbance.  Respiratory: Negative for cough.   Cardiovascular: Negative for chest pain, palpitations and leg swelling.  Gastrointestinal: Negative for abdominal distention, blood in stool, diarrhea and nausea.  Genitourinary: Negative for frequency and hematuria.  Musculoskeletal: Positive for arthralgias. Negative for back pain, gait problem, joint swelling and neck pain.  Skin: Negative for rash.  Neurological: Negative for dizziness, tremors, speech difficulty and weakness.  Psychiatric/Behavioral: Negative for agitation, dysphoric mood and sleep disturbance. The patient is not nervous/anxious.     Objective:  BP 128/84 (BP Location: Left Arm)   Pulse 82   Temp 98.4 F (36.9 C) (Oral)   Ht 6' 2.5" (1.892 m)   Wt 292 lb 12.8 oz (132.8 kg)   SpO2 97%   BMI 37.09 kg/m   BP Readings from Last 3 Encounters:  10/26/20 128/84  03/24/20 124/80  01/30/20 124/82    Wt Readings from Last 3 Encounters:  10/26/20 292 lb 12.8 oz (132.8 kg)  03/24/20 287 lb (130.2 kg)  01/30/20 273 lb (123.8 kg)    Physical Exam Constitutional:      General: He is not in acute distress.    Appearance: He is well-developed. He is obese.     Comments: NAD  Eyes:     Conjunctiva/sclera: Conjunctivae  normal.     Pupils: Pupils are equal, round, and reactive to light.  Neck:     Thyroid: No thyromegaly.     Vascular: No JVD.  Cardiovascular:     Rate and Rhythm: Normal rate and regular rhythm.     Heart sounds: Normal heart sounds. No murmur heard.  No friction rub. No gallop.   Pulmonary:     Effort: Pulmonary effort is normal. No respiratory distress.     Breath sounds: Normal breath sounds. No wheezing or rales.  Chest:     Chest wall: No tenderness.  Abdominal:     General: Bowel sounds  are normal. There is no distension.     Palpations: Abdomen is soft. There is no mass.     Tenderness: There is no abdominal tenderness. There is no guarding or rebound.  Musculoskeletal:        General: No tenderness. Normal range of motion.     Cervical back: Normal range of motion.  Lymphadenopathy:     Cervical: No cervical adenopathy.  Skin:    General: Skin is warm and dry.     Findings: No rash.  Neurological:     Mental Status: He is alert and oriented to person, place, and time.     Cranial Nerves: No cranial nerve deficit.     Motor: No abnormal muscle tone.     Coordination: Coordination normal.     Gait: Gait normal.     Deep Tendon Reflexes: Reflexes are normal and symmetric.  Psychiatric:        Behavior: Behavior normal.        Thought Content: Thought content normal.        Judgment: Judgment normal.   Right lateral epicondyle is painful I spent 22 minutes in addition to time for CPX wellness examination in preparing to see the patient by review of recent labs, imaging and procedures, obtaining and reviewing separately obtained history, communicating with the patient, ordering medications, tests or procedures, and documenting clinical information in the EHR including the differential diagnosis, treatment, and any further evaluation and other management of  insomnia, obesity, tennis elbow.         Lab Results  Component Value Date   WBC 5.1 03/24/2020   HGB 12.5 (L) 03/24/2020   HCT 37.3 (L) 03/24/2020   PLT 229.0 03/24/2020   GLUCOSE 86 03/24/2020   CHOL 153 01/30/2020   TRIG 95.0 01/30/2020   HDL 36.30 (L) 01/30/2020   LDLCALC 98 01/30/2020   ALT 20 04/19/2019   AST 14 04/19/2019   NA 138 03/24/2020   K 3.9 03/24/2020   CL 103 03/24/2020   CREATININE 0.96 03/24/2020   BUN 13 03/24/2020   CO2 32 03/24/2020   TSH 1.06 04/19/2019   PSA 0.73 01/30/2020    CT Chest W Contrast  Result Date: 04/03/2020 CLINICAL DATA:  Cough EXAM: CT CHEST WITH CONTRAST  TECHNIQUE: Multidetector CT imaging of the chest was performed during intravenous contrast administration. CONTRAST:  1mL OMNIPAQUE IOHEXOL 300 MG/ML  SOLN COMPARISON:  Correlation made with MRI abdomen 2014 FINDINGS: Cardiovascular: Normal heart size. No pericardial effusion. Thoracic aorta is unremarkable. Mediastinum/Nodes: There are no enlarged lymph nodes. Imaged thyroid is unremarkable. Esophagus is unremarkable. Lungs/Pleura: No consolidation or mass. No pleural effusion. Central airways are patent. There is no peribronchial thickening. Upper Abdomen: Liver lesions are again identified with dominant lesions essentially replacing the right hepatic and caudate lobes with peripheral foci of enhancement consistent with cavernous  hemangiomas. Musculoskeletal: No acute osseous abnormality. IMPRESSION: No significant abnormality in the chest. Large liver cavernous hemangiomas as seen on prior imaging. Electronically Signed   By: Macy Mis M.D.   On: 04/03/2020 16:54    Assessment & Plan:    Follow-up: No follow-ups on file.  Walker Kehr, MD

## 2020-10-26 NOTE — Assessment & Plan Note (Signed)
Triamt/HCT

## 2020-10-26 NOTE — Patient Instructions (Addendum)
Theraband flexbar Ice - heat Voltaren gel  Tennis Elbow Tennis elbow is swelling (inflammation) in your outer forearm, near your elbow. Swelling affects the tissues that connect muscle to bone (tendons). Tennis elbow can happen in any sport or job in which you use your elbow too much. It is caused by doing the same motion over and over. Tennis elbow can cause:  Pain and tenderness in your forearm and the outer part of your elbow. You may have pain all the time, or only when using the arm.  A burning feeling. This runs from your elbow through your arm.  Weak grip in your hand. Follow these instructions at home: Activity  Rest your elbow and wrist. Avoid activities that cause problems, as told by your doctor.  If told by your doctor, wear an elbow strap to reduce stress on the area.  Do physical therapy exercises as told.  If you lift an object, lift it with your palm facing up. This is easier on your elbow. Lifestyle  If your tennis elbow is caused by sports, check your equipment and make sure that: ? You are using it correctly. ? It fits you well.  If your tennis elbow is caused by work or by using a computer, take breaks often to stretch your arm. Talk with your manager about how you can manage your condition at work. If you have a brace:  Wear the brace as told by your doctor. Remove it only as told by your doctor.  Loosen the brace if your fingers tingle, get numb, or turn cold and blue.  Keep the brace clean.  If the brace is not waterproof, ask your doctor if you may take the brace off for bathing. If you must keep the brace on while bathing: ? Do not let it get wet. ? Cover it with a watertight covering when you take a bath or a shower. General instructions   If told, put ice on the painful area: ? Put ice in a plastic bag. ? Place a towel between your skin and the bag. ? Leave the ice on for 20 minutes, 2-3 times a day.  Take over-the-counter and prescription  medicines only as told by your doctor.  Keep all follow-up visits as told by your doctor. This is important. Contact a doctor if:  Your pain does not get better with treatment.  Your pain gets worse.  You have weakness in your forearm, hand, or fingers.  You cannot feel your forearm, hand, or fingers. Summary  Tennis elbow is swelling (inflammation) in your outer forearm, near your elbow.  Tennis elbow is caused by doing the same motion over and over.  Rest your elbow and wrist. Avoid activities that cause problems, as told by your doctor.  If told, put ice on the painful area for 20 minutes, 2-3 times a day. This information is not intended to replace advice given to you by your health care provider. Make sure you discuss any questions you have with your health care provider. Document Revised: 09/07/2018 Document Reviewed: 09/26/2017 Elsevier Patient Education  [employer].   These suggestions will probably help you to improve your metabolism if you are not overweight and to lose weight if you are overweight: 1.  Reduce your consumption of sugars and starches.  Eliminate high fructose corn syrup from your diet.  Reduce your consumption of processed foods.  For desserts try to have seasonal fruits, berries, nuts, cheeses or dark chocolate with more than 70%  cacao. 2.  Do not snack 3.  You do not have to eat breakfast.  If you choose to have breakfast - eat plain greek yogurt, eggs, oatmeal (without sugar) - use honey if you need to. 4.  Drink water, freshly brewed unsweetened tea (green, black or herbal) or coffee.  Do not drink sodas including diet sodas , juices, beverages sweetened with artificial sweeteners. 5.  Reduce your consumption of refined grains. 6.  Avoid protein drinks such as Optifast, Slim fast etc. Eat chicken, fish, meat, dairy and beans for your sources of protein. 7.  Natural unprocessed fats like cold pressed virgin olive oil, butter, coconut oil are good  for you.  Eat avocados. 8.  Increase your consumption of fiber.  Fruits, berries, vegetables, whole grains, flaxseed, chia seeds, beans, popcorn, nuts, oatmeal are good sources of fiber 9.  Use vinegar in your diet, i.e. apple cider vinegar, red wine or balsamic vinegar 10.  You can try fasting.  For example you can skip breakfast and lunch every other day (24-hour fast) 11.  Stress reduction, good night sleep, relaxation, meditation, yoga and other physical activity is likely to help you to maintain low weight too. 12.  If you drink alcohol, limit your alcohol intake to no more than 2 drinks a day.   Mediterranean diet is good for you. ([employer] has a typical Mediterranean cuisine menu) The Mediterranean diet is a way of eating based on the traditional cuisine of countries bordering the [employer]. While there is no single definition of the Mediterranean diet, it is typically high in vegetables, fruits, whole grains, beans, nut and seeds, and olive oil. The main components of Mediterranean diet include: [name] Daily consumption of vegetables, fruits, whole grains and healthy fats  . Weekly intake of fish, poultry, beans and eggs  . Moderate portions of dairy products  . Limited intake of red meat Other important elements of the Mediterranean diet are sharing meals with family and friends, enjoying a glass of red wine and being physically active. Health benefits of a Mediterranean diet: A traditional Mediterranean diet consisting of large quantities of fresh fruits and vegetables, nuts, fish and olive oil--coupled with physical activity--can reduce your risk of serious mental and physical health problems by: Preventing heart disease and strokes. Following a Mediterranean diet limits your intake of refined breads, processed foods, and red meat, and encourages drinking red wine instead of hard liquor--all factors that can help prevent heart disease and stroke. Keeping you agile. If you're an  older adult, the nutrients gained with a Mediterranean diet may reduce your risk of developing muscle weakness and other signs of frailty by about 70 percent. Reducing the risk of Alzheimer's. Research suggests that the [place] diet may improve cholesterol, blood sugar levels, and overall blood vessel health, which in turn may reduce your risk of Alzheimer's disease or dementia. Halving the risk of Parkinson's disease. The high levels of antioxidants in the Mediterranean diet can prevent cells from undergoing a damaging process called oxidative stress, thereby cutting the risk of Parkinson's disease in half. Increasing longevity. By reducing your risk of developing heart disease or cancer with the Mediterranean diet, you're reducing your risk of death at any age by 20%. Protecting against type 2 diabetes. A Mediterranean diet is rich in fiber which digests slowly, prevents huge swings in blood sugar, and can help you maintain a healthy weight.    Cabbage soup recipe that will not make you gain weight: Take 1 small  head of cabbage, 1 average pack of celery, 4 green peppers, 4 onions, 2 cans diced tomatoes (they are not available without salt), salt and spices to taste.  Chop cabbage, celery, peppers and onions.  And tomatoes and 2-2.5 liters (2.5 quarts) of water so that it would just cover the vegetables.  Bring to boil.  Add spices and salt.  Turn heat to low/medium and simmer for 20-25 minutes.  Naturally, you can make a smaller batch and change some of the ingredients.

## 2020-10-26 NOTE — Assessment & Plan Note (Signed)
Vit D and B12

## 2020-11-02 ENCOUNTER — Ambulatory Visit: Payer: 59 | Attending: Internal Medicine

## 2020-11-02 DIAGNOSIS — Z23 Encounter for immunization: Secondary | ICD-10-CM

## 2020-11-02 NOTE — Assessment & Plan Note (Signed)
Weight loss should help

## 2020-11-02 NOTE — Progress Notes (Signed)
   Covid-19 Vaccination [facility]  Name:  Nicholas Stevens    MRN: [id number] DOB: [birth year]  11/02/2020  [name] was observed post Covid-19 immunization for 15 minutes without incident. He was provided with Vaccine Information Sheet and instruction to access the V-Safe system.   [name] was instructed to call 911 with any severe reactions post vaccine: [name] Difficulty breathing  . Swelling of face and throat  . A fast heartbeat  . A bad rash all over body  . Dizziness and weakness

## 2020-11-23 IMAGING — CT CT CHEST W/ CM
2 of 4 series · 15 of 36 positions shown, 18 images · IV contrast (omnipaque)
Comparison: Correlation made with MRI abdomen [id number]

CLINICAL DATA: Cough

EXAM:
CT CHEST WITH CONTRAST
TECHNIQUE: Multidetector CT imaging of the chest was performed during
intravenous contrast administration.
CONTRAST:  80mL OMNIPAQUE IOHEXOL 300 MG/ML  SOLN

[Series 2: thorax · axial · 0.83mm/px · z∈[-326,-44]mm · 12 of 167 slices shown, 15 images]
[im 13/167  mediastinal]
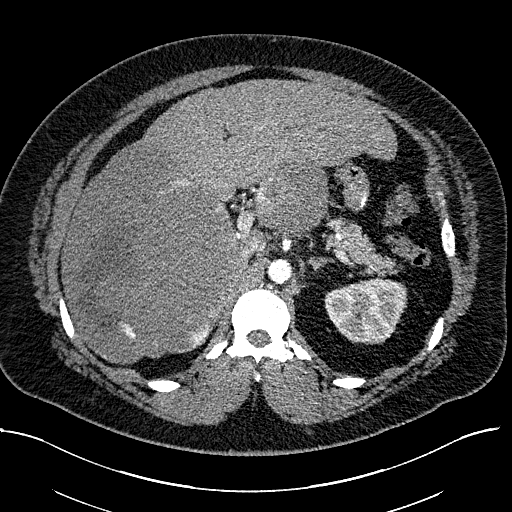
[im 13/167  lung]
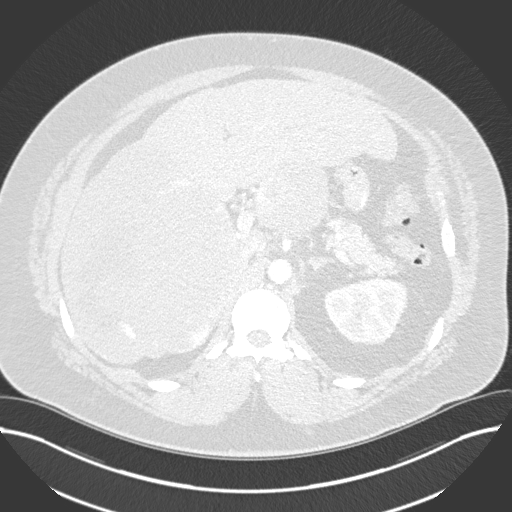
[im 26/167  lung]
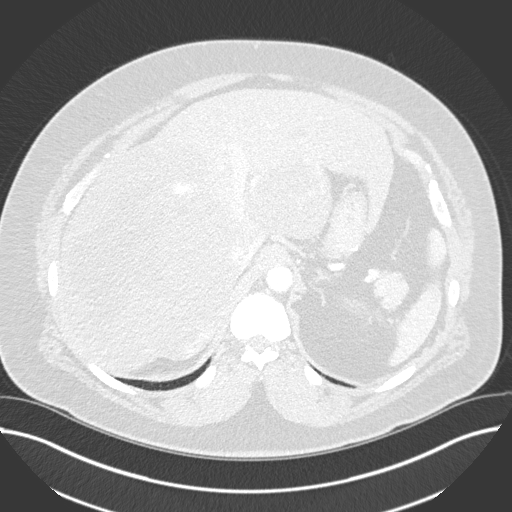
[im 39/167  lung]
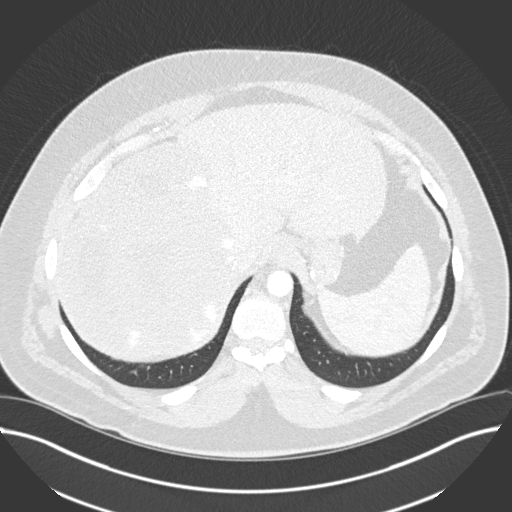
[im 52/167  lung]
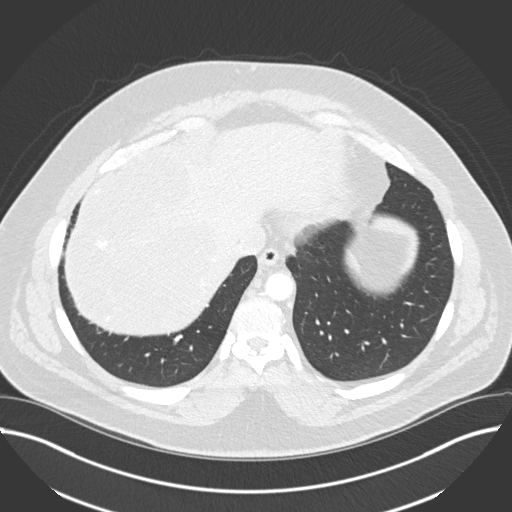
[im 64/167  mediastinal]
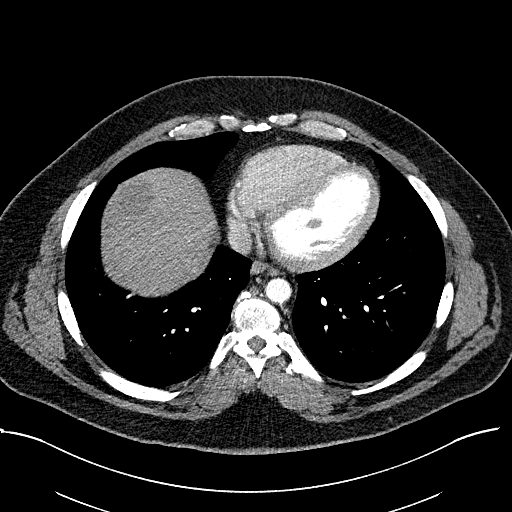
[im 64/167  lung]
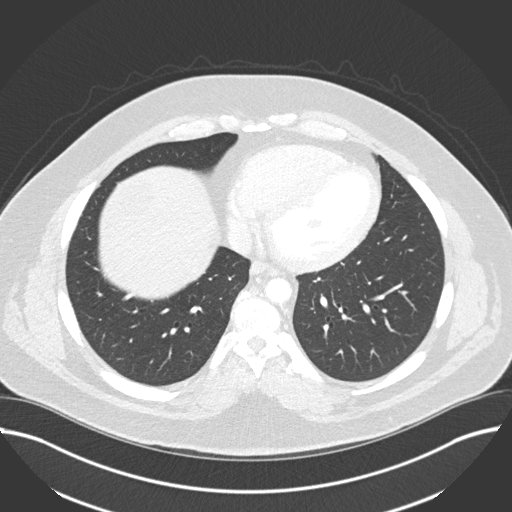
[im 77/167  lung]
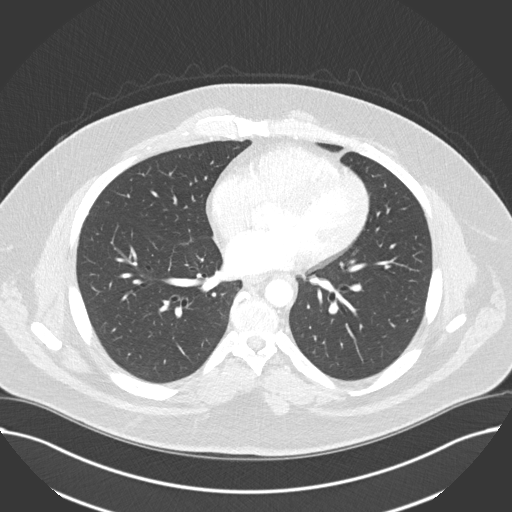
[im 90/167  lung]
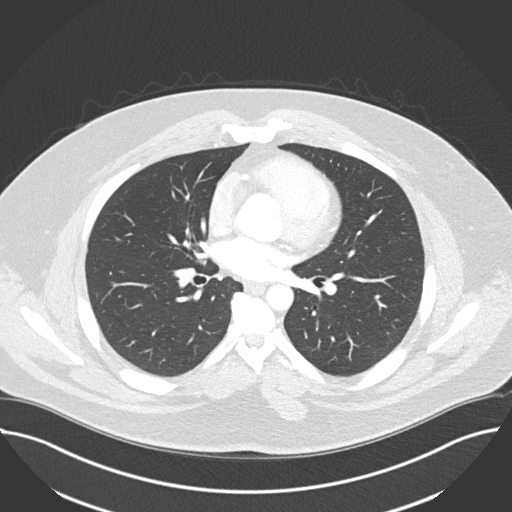
[im 103/167  lung]
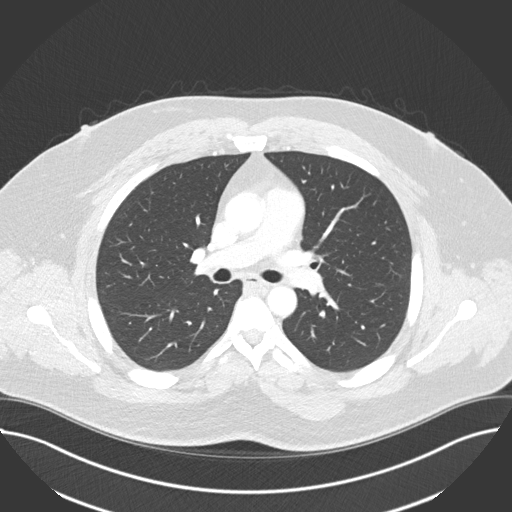
[im 115/167  mediastinal]
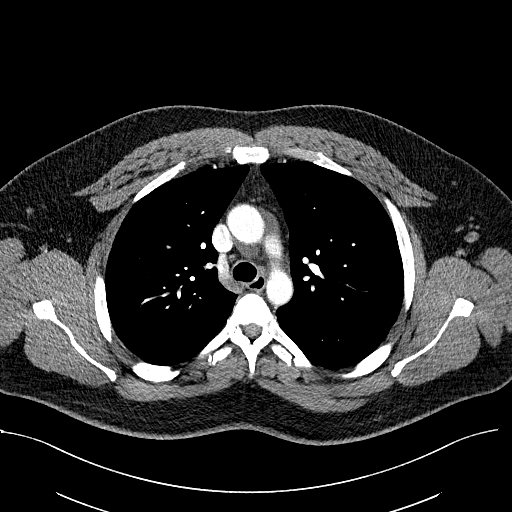
[im 115/167  lung]
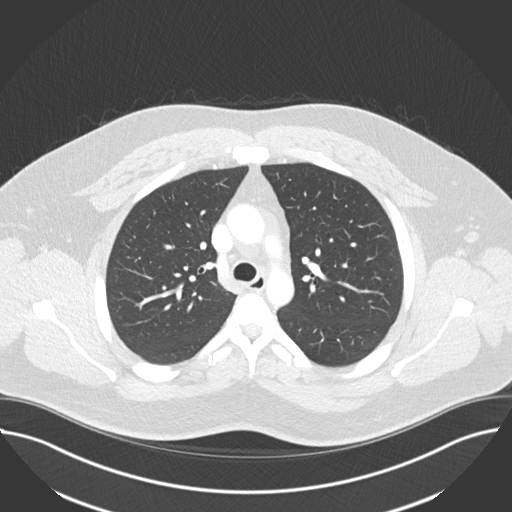
[im 128/167  lung]
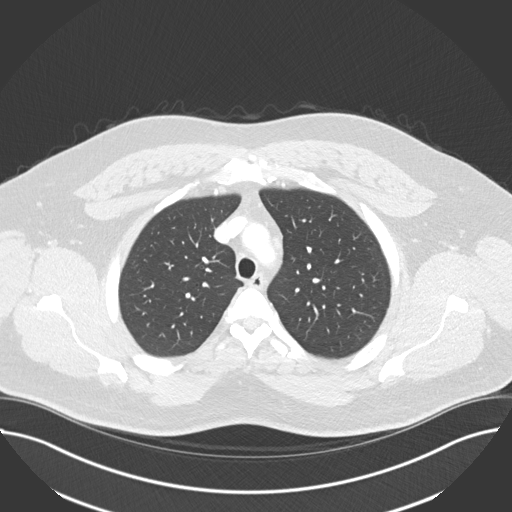
[im 141/167  lung]
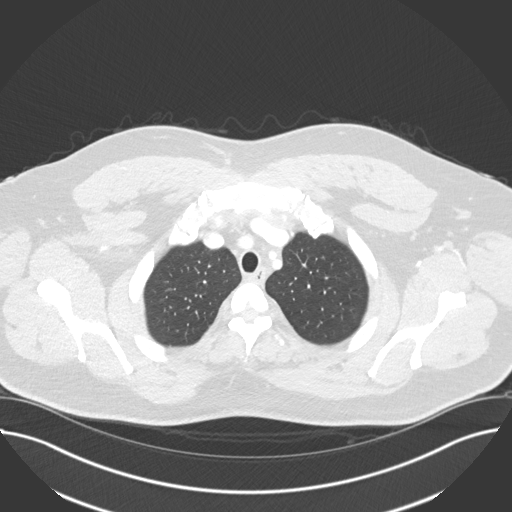
[im 154/167  lung]
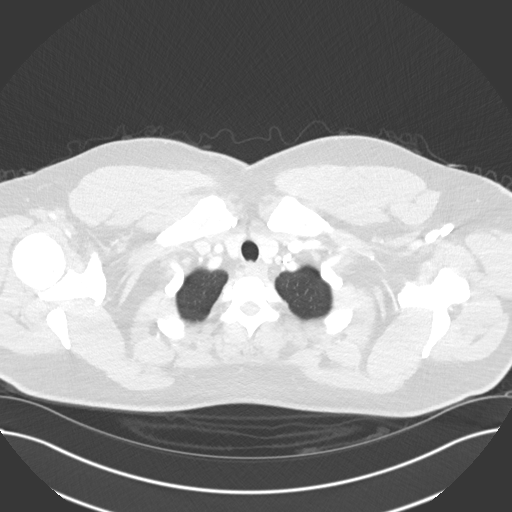

[Series 5: coronal · coronal · 0.65mm/px · 3 of 151 slices shown]
[im 31/151  lung]
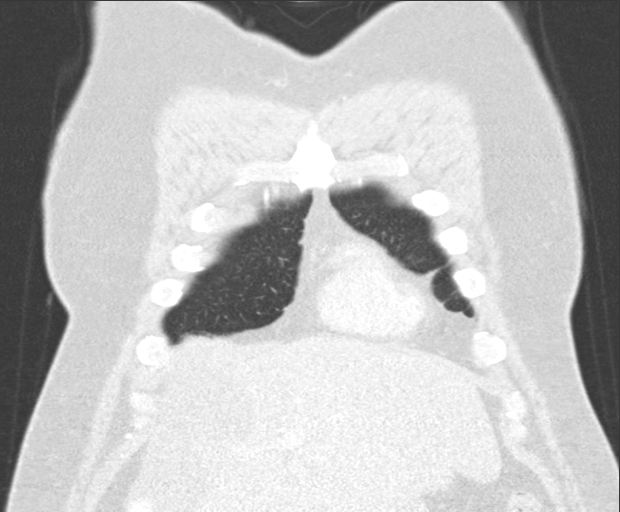
[im 61/151  lung]
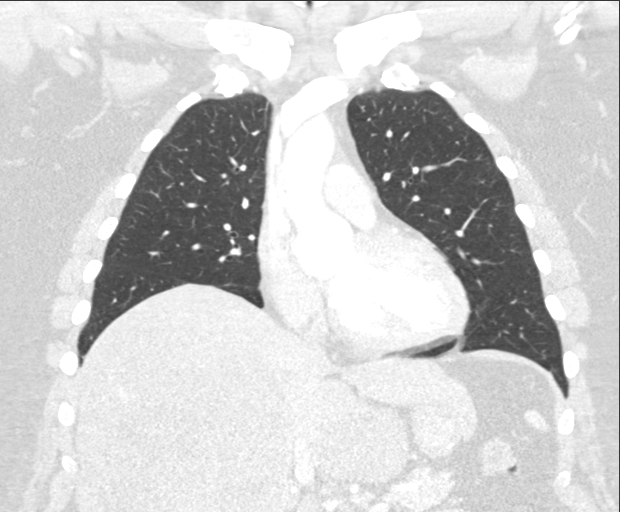
[im 91/151  lung]
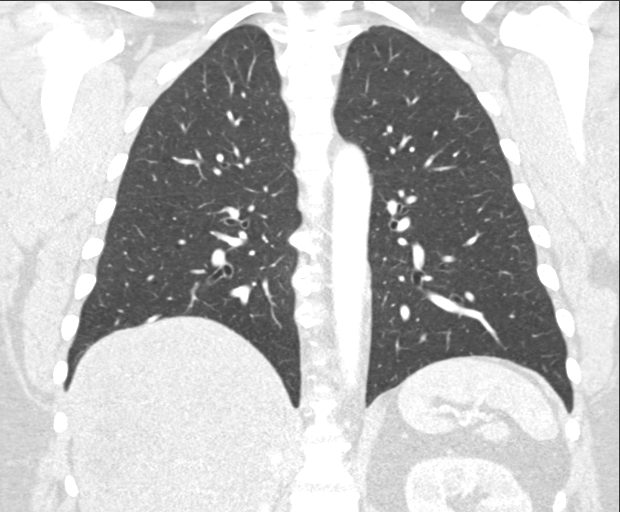

[15 of 36 positions shown; findings below may reference images not displayed]

FINDINGS: Cardiovascular: Normal heart size. No pericardial effusion. Thoracic
aorta is unremarkable.

Mediastinum/Nodes: There are no enlarged lymph nodes. Imaged thyroid
is unremarkable. Esophagus is unremarkable.

Lungs/Pleura: No consolidation or mass. No pleural effusion. Central
airways are patent. There is no peribronchial thickening.

Upper Abdomen: Liver lesions are again identified with dominant
lesions essentially replacing the right hepatic and caudate lobes
with peripheral foci of enhancement consistent with cavernous
hemangiomas.

Musculoskeletal: No acute osseous abnormality.
IMPRESSION: No significant abnormality in the chest.

Large liver cavernous hemangiomas as seen on prior imaging.

## 2020-12-11 ENCOUNTER — Other Ambulatory Visit: Payer: Self-pay | Admitting: Internal Medicine

## 2021-01-21 ENCOUNTER — Other Ambulatory Visit: Payer: Self-pay | Admitting: Internal Medicine

## 2021-08-04 ENCOUNTER — Encounter: Payer: Self-pay | Admitting: Internal Medicine

## 2021-08-04 ENCOUNTER — Other Ambulatory Visit: Payer: Self-pay

## 2021-08-04 ENCOUNTER — Ambulatory Visit: Payer: 59 | Admitting: Internal Medicine

## 2021-08-04 VITALS — BP 130/82 | HR 76 | Temp 98.2°F | Ht 74.5 in | Wt 279.4 lb

## 2021-08-04 DIAGNOSIS — N529 Male erectile dysfunction, unspecified: Secondary | ICD-10-CM | POA: Diagnosis not present

## 2021-08-04 DIAGNOSIS — E291 Testicular hypofunction: Secondary | ICD-10-CM

## 2021-08-04 DIAGNOSIS — Z0000 Encounter for general adult medical examination without abnormal findings: Secondary | ICD-10-CM

## 2021-08-04 DIAGNOSIS — Z1211 Encounter for screening for malignant neoplasm of colon: Secondary | ICD-10-CM | POA: Diagnosis not present

## 2021-08-04 LAB — CBC WITH DIFFERENTIAL/PLATELET
Basophils Absolute: 0 10*3/uL (ref 0.0–0.1)
Basophils Relative: 0.5 % (ref 0.0–3.0)
Eosinophils Absolute: 0.1 10*3/uL (ref 0.0–0.7)
Eosinophils Relative: 2.4 % (ref 0.0–5.0)
HCT: 40.7 % (ref 39.0–52.0)
Hemoglobin: 13.4 g/dL (ref 13.0–17.0)
Lymphocytes Relative: 41.7 % (ref 12.0–46.0)
Lymphs Abs: 1.6 10*3/uL (ref 0.7–4.0)
MCHC: 33 g/dL (ref 30.0–36.0)
MCV: 81.6 fl (ref 78.0–100.0)
Monocytes Absolute: 0.3 10*3/uL (ref 0.1–1.0)
Monocytes Relative: 8 % (ref 3.0–12.0)
Neutro Abs: 1.8 10*3/uL (ref 1.4–7.7)
Neutrophils Relative %: 47.4 % (ref 43.0–77.0)
Platelets: 234 10*3/uL (ref 150.0–400.0)
RBC: 4.98 Mil/uL (ref 4.22–5.81)
RDW: 15 % (ref 11.5–15.5)
WBC: 3.8 10*3/uL — ABNORMAL LOW (ref 4.0–10.5)

## 2021-08-04 NOTE — Assessment & Plan Note (Signed)
ED-he continues to have difficulty maintaining erection for over 10 minutes.  Has been taking Cialis 20 mg every 3 days.  He did see urologist in the past. He was advised to take Cialis as needed every 3 days Other options for ED treatments discussed Obtain testosterone level and other labs

## 2021-08-04 NOTE — Progress Notes (Signed)
Subjective:  Patient ID: Nicholas Stevens, male    DOB: November 16, 1971  Age: 50 y.o. MRN: [id number]  CC: Follow-up (Want to get testosterone level check)   HPI Zedric W Blissett presents for ED-he continues to have difficulty maintaining erection for over 10 minutes.  Has been taking Cialis 20 mg every 3 days.  Follow-up on a history of low testosterone The patient lost weight on diet.  He has been exercising regular  Outpatient Medications Prior to Visit  Medication Sig Dispense Refill   Cholecalciferol (VITAMIN D3) 2000 units capsule Take 1 capsule (2,000 Units total) daily by mouth. 100 capsule 3   latanoprost (XALATAN) 0.005 % ophthalmic solution latanoprost 0.005 % eye drops     Multiple Vitamin (MULTIVITAMIN WITH MINERALS) TABS tablet Take 1 tablet by mouth daily.     tadalafil (CIALIS) 20 MG tablet TAKE 1 TABLET BY MOUTH DAILY AS NEEDED FOR ERECTILE DYSFUNCTION 12 tablet 11   triamterene-hydrochlorothiazide (DYAZIDE) 37.5-25 MG capsule Take 1 each (1 capsule total) by mouth daily. Office visit needed before refills will be given 90 capsule 3   zolpidem (AMBIEN) 10 MG tablet TAKE 1 TABLET BY MOUTH EVERY DAY AT BEDTIME *NEED OFFICE VISIT FOR MORE REFILLS* 30 tablet 5   trimethoprim-polymyxin b (POLYTRIM) ophthalmic solution Place 1 drop into both eyes every 4 (four) hours. (in the affected eye or eyes) for 4-5 days 10 mL 0   No facility-administered medications prior to visit.    ROS: Review of Systems  Constitutional:  Negative for appetite change, fatigue and unexpected weight change.  HENT:  Negative for congestion, nosebleeds, sneezing, sore throat and trouble swallowing.   Eyes:  Negative for itching and visual disturbance.  Respiratory:  Negative for cough.   Cardiovascular:  Negative for chest pain, palpitations and leg swelling.  Gastrointestinal:  Negative for abdominal distention, blood in stool, diarrhea and nausea.  Genitourinary:  Negative for frequency and  hematuria.  Musculoskeletal:  Negative for back pain, gait problem, joint swelling and neck pain.  Skin:  Negative for rash.  Neurological:  Negative for dizziness, tremors, speech difficulty and weakness.  Psychiatric/Behavioral:  Negative for agitation, dysphoric mood and sleep disturbance. The patient is not nervous/anxious.    Objective:  BP 130/82 (BP Location: Left Arm)   Pulse 76   Temp 98.2 F (36.8 C) (Oral)   Ht 6' 2.5" (1.892 m)   Wt 279 lb 6.4 oz (126.7 kg)   SpO2 97%   BMI 35.39 kg/m   BP Readings from Last 3 Encounters:  08/04/21 130/82  10/26/20 128/84  03/24/20 124/80    Wt Readings from Last 3 Encounters:  08/04/21 279 lb 6.4 oz (126.7 kg)  10/26/20 292 lb 12.8 oz (132.8 kg)  03/24/20 287 lb (130.2 kg)    Physical Exam Constitutional:      General: He is not in acute distress.    Appearance: He is well-developed. He is obese. He is not ill-appearing.     Comments: NAD  Eyes:     Conjunctiva/sclera: Conjunctivae normal.     Pupils: Pupils are equal, round, and reactive to light.  Neck:     Thyroid: No thyromegaly.     Vascular: No JVD.  Cardiovascular:     Rate and Rhythm: Normal rate and regular rhythm.     Heart sounds: Normal heart sounds. No murmur heard.   No friction rub. No gallop.  Pulmonary:     Effort: Pulmonary effort is normal. No respiratory distress.  Breath sounds: Normal breath sounds. No wheezing or rales.  Chest:     Chest wall: No tenderness.  Abdominal:     General: Bowel sounds are normal. There is no distension.     Palpations: Abdomen is soft. There is no mass.     Tenderness: There is no abdominal tenderness. There is no guarding or rebound.  Musculoskeletal:        General: No tenderness. Normal range of motion.     Cervical back: Normal range of motion.  Lymphadenopathy:     Cervical: No cervical adenopathy.  Skin:    General: Skin is warm and dry.     Findings: No rash.  Neurological:     Mental Status: He is  alert and oriented to person, place, and time.     Cranial Nerves: No cranial nerve deficit.     Motor: No abnormal muscle tone.     Coordination: Coordination normal.     Gait: Gait normal.     Deep Tendon Reflexes: Reflexes are normal and symmetric.  Psychiatric:        Behavior: Behavior normal.        Thought Content: Thought content normal.        Judgment: Judgment normal.    Lab Results  Component Value Date   WBC 5.2 10/26/2020   HGB 14.3 10/26/2020   HCT 42.3 10/26/2020   PLT 268.0 10/26/2020   GLUCOSE 88 10/26/2020   CHOL 181 10/26/2020   TRIG (H) 10/26/2020    451.0 Triglyceride is over 400; calculations on Lipids are invalid.   HDL 34.30 (L) 10/26/2020   LDLDIRECT 116.0 10/26/2020   LDLCALC 98 01/30/2020   ALT 23 10/26/2020   AST 21 10/26/2020   NA 139 10/26/2020   K 3.7 10/26/2020   CL 100 10/26/2020   CREATININE 0.98 10/26/2020   BUN 14 10/26/2020   CO2 31 10/26/2020   TSH 1.05 10/26/2020   PSA 0.50 10/26/2020    CT Chest W Contrast  Result Date: 04/03/2020 CLINICAL DATA:  Cough EXAM: CT CHEST WITH CONTRAST TECHNIQUE: Multidetector CT imaging of the chest was performed during intravenous contrast administration. CONTRAST:  4m OMNIPAQUE IOHEXOL 300 MG/ML  SOLN COMPARISON:  Correlation made with MRI abdomen 2014 FINDINGS: Cardiovascular: Normal heart size. No pericardial effusion. Thoracic aorta is unremarkable. Mediastinum/Nodes: There are no enlarged lymph nodes. Imaged thyroid is unremarkable. Esophagus is unremarkable. Lungs/Pleura: No consolidation or mass. No pleural effusion. Central airways are patent. There is no peribronchial thickening. Upper Abdomen: Liver lesions are again identified with dominant lesions essentially replacing the right hepatic and caudate lobes with peripheral foci of enhancement consistent with cavernous hemangiomas. Musculoskeletal: No acute osseous abnormality. IMPRESSION: No significant abnormality in the chest. Large liver  cavernous hemangiomas as seen on prior imaging. Electronically Signed   By: PMacy MisM.D.   On: 04/03/2020 16:54    Assessment & Plan:     AWalker Kehr MD

## 2021-08-04 NOTE — Assessment & Plan Note (Signed)
Continue with good diet, weight loss, exercise Obtain testosterone level

## 2021-08-05 LAB — URINALYSIS
Bilirubin Urine: NEGATIVE
Hgb urine dipstick: NEGATIVE
Ketones, ur: NEGATIVE
Leukocytes,Ua: NEGATIVE
Nitrite: NEGATIVE
Specific Gravity, Urine: 1.02 (ref 1.000–1.030)
Total Protein, Urine: NEGATIVE
Urine Glucose: NEGATIVE
Urobilinogen, UA: 0.2 (ref 0.0–1.0)
pH: 6 (ref 5.0–8.0)

## 2021-08-05 LAB — COMPREHENSIVE METABOLIC PANEL
ALT: 23 U/L (ref 0–53)
AST: 22 U/L (ref 0–37)
Albumin: 4.6 g/dL (ref 3.5–5.2)
Alkaline Phosphatase: 49 U/L (ref 39–117)
BUN: 13 mg/dL (ref 6–23)
CO2: 23 mEq/L (ref 19–32)
Calcium: 10.3 mg/dL (ref 8.4–10.5)
Chloride: 102 mEq/L (ref 96–112)
Creatinine, Ser: 1.13 mg/dL (ref 0.40–1.50)
GFR: 75.11 mL/min (ref >60.00)
Glucose, Bld: 79 mg/dL (ref 70–99)
Potassium: 4.4 mEq/L (ref 3.5–5.1)
Sodium: 142 mEq/L (ref 135–145)
Total Bilirubin: 0.7 mg/dL (ref 0.2–1.2)
Total Protein: 7.6 g/dL (ref 6.0–8.3)

## 2021-08-05 LAB — LIPID PANEL
Cholesterol: 158 mg/dL (ref 0–200)
HDL: 40.2 mg/dL (ref >39.00)
LDL Cholesterol: 102 mg/dL — ABNORMAL HIGH (ref 0–99)
NonHDL: 118.27
Total CHOL/HDL Ratio: 4
Triglycerides: 80 mg/dL (ref 0.0–149.0)
VLDL: 16 mg/dL (ref 0.0–40.0)

## 2021-08-05 LAB — PSA: PSA: 0.61 ng/mL (ref 0.10–4.00)

## 2021-08-05 LAB — TSH: TSH: 0.72 u[IU]/mL (ref 0.35–5.50)

## 2021-08-05 LAB — TESTOSTERONE: Testosterone: 442.28 ng/dL (ref 300.00–890.00)

## 2021-08-19 ENCOUNTER — Other Ambulatory Visit: Payer: Self-pay | Admitting: Internal Medicine

## 2021-09-18 ENCOUNTER — Other Ambulatory Visit: Payer: Self-pay | Admitting: Internal Medicine

## 2021-10-11 ENCOUNTER — Other Ambulatory Visit: Payer: Self-pay | Admitting: Internal Medicine

## 2021-11-02 ENCOUNTER — Other Ambulatory Visit: Payer: Self-pay | Admitting: Internal Medicine

## 2021-11-14 ENCOUNTER — Other Ambulatory Visit: Payer: Self-pay | Admitting: Internal Medicine

## 2021-11-17 ENCOUNTER — Other Ambulatory Visit: Payer: Self-pay | Admitting: *Deleted

## 2021-11-17 MED ORDER — TRIAMTERENE-HCTZ 37.5-25 MG PO CAPS: ORAL_CAPSULE | ORAL | 3 refills | Status: DC

## 2021-11-17 MED ORDER — PANTOPRAZOLE SODIUM 40 MG PO TBEC: 40.0000 mg | DELAYED_RELEASE_TABLET | Freq: Every day | ORAL | 3 refills | Status: DC

## 2021-11-28 ENCOUNTER — Other Ambulatory Visit: Payer: Self-pay | Admitting: Internal Medicine

## 2021-12-15 ENCOUNTER — Encounter: Payer: Self-pay | Admitting: Gastroenterology

## 2021-12-30 ENCOUNTER — Encounter: Payer: 59 | Admitting: Internal Medicine

## 2022-01-03 ENCOUNTER — Encounter: Payer: Self-pay | Admitting: Internal Medicine

## 2022-01-03 NOTE — Telephone Encounter (Signed)
Pharmacy has been updated.[name] Pls advise on refill.Marland KitchenJohny Chess

## 2022-01-04 ENCOUNTER — Other Ambulatory Visit: Payer: Self-pay | Admitting: Internal Medicine

## 2022-01-04 MED ORDER — ZOLPIDEM TARTRATE 10 MG PO TABS: ORAL_TABLET | ORAL | 2 refills | Status: DC

## 2022-01-18 ENCOUNTER — Telehealth: Payer: Self-pay | Admitting: *Deleted

## 2022-01-18 ENCOUNTER — Encounter: Payer: Self-pay | Admitting: *Deleted

## 2022-01-18 DIAGNOSIS — T884XXA Failed or difficult intubation, initial encounter: Secondary | ICD-10-CM | POA: Insufficient documentation

## 2022-01-18 NOTE — Telephone Encounter (Signed)
JOHN, 2016 DIFFICULTY AS WELL  Difficulty Due To: Difficulty was anticipated, Difficult Airway- due to anterior larynx and Difficult Airway- due to large tongue Comments: DVL with MAC 4 by CRNA , visualize base of arytenoids attempt to pass ETT with bougie, - EtCo2, - breath sounds. ETT removed pt bagged with 100 % O2, DVL with Mac 4 by Dr. Ermalene Postin, grade 3 view with intubation with Bougie stylet, + EtCo2, + bil breath sounds.

## 2022-01-18 NOTE — Telephone Encounter (Signed)
LINDA,   CAN YOU PLEASE SCHEDULE PT FOR A DIRECT AT [facility] FOR A HX OF DIFFICULT INTUBATION   THX, MARIE

## 2022-01-18 NOTE — Telephone Encounter (Signed)
JOHN, PLEASE ADVISE ABOUT [facility] CASE FOR PT AS PER ANESTHESIA NOTE  BELOW-  DR Candis Schatz PT   Procedure Name: Intubation Date/Time: 10/08/2013 11:29 AM Performed by: [place] STEPHANIE C Pre-anesthesia Checklist: Timeout performed, Patient identified, Emergency Drugs available, Suction available and Patient being monitored Patient Re-evaluated:Patient Re-evaluated prior to inductionOxygen Delivery Method: Circle system utilized Preoxygenation: Pre-oxygenation with 100% oxygen Intubation Type: Combination inhalational/ intravenous induction Ventilation: Mask ventilation without difficulty and Oral airway inserted - appropriate to patient size Laryngoscope Size: Mac and 4 Tube type: Oral Tube size: 7.5 mm Number of attempts: 3 Airway Equipment and Method: Video-laryngoscopy and Rigid stylet Placement Confirmation: ETT inserted through vocal cords under direct vision,  breath sounds checked- equal and bilateral,  positive ETCO2 and CO2 detector Secured at: 22 cm Tube secured with: Tape Dental Injury: Teeth and Oropharynx as per pre-operative assessment  Difficulty Due To: Difficult Airway- due to large tongue Future Recommendations: Recommend- induction with short-acting agent, and alternative techniques readily available Comments: DL x 1 by CRNA, epiglottis only seen.  DL x 1 by Dr. Landry Dyke MAC 4, unable to visualize VC. DL with Mac 4 Glidescope.  Cords visualized, 7,5 Ett placed with rigid stylet in place.

## 2022-01-18 NOTE — Telephone Encounter (Signed)
Called pt- LM to return my call  Lelan Pons

## 2022-01-18 NOTE — Telephone Encounter (Signed)
Dr Candis Schatz,  This pt has been scheduled for a screening colon, no GI hx- in reviewing his notes, he has had 2 difficult intubations, one 2014, one 2016- per Osvaldo Angst, CRNA- pt needs hospital colon- do you want him to have an OV or direct at [facility]?  Please advise, Marijean Niemann

## 2022-02-08 NOTE — Telephone Encounter (Signed)
Thanks. He does not have any dates available in April correct?

## 2022-02-08 NOTE — Telephone Encounter (Signed)
Maya do you have this pt on the list for hospital procedures?

## 2022-02-25 ENCOUNTER — Encounter: Payer: 59 | Admitting: Gastroenterology

## 2022-03-03 ENCOUNTER — Telehealth: Payer: Self-pay

## 2022-03-03 ENCOUNTER — Other Ambulatory Visit: Payer: Self-pay

## 2022-03-03 DIAGNOSIS — Z1211 Encounter for screening for malignant neoplasm of colon: Secondary | ICD-10-CM

## 2022-03-03 NOTE — Telephone Encounter (Signed)
Pt scheduled for previsit 04/11/22 at 10:30am. Pt scheduled for colon at [facility] 05/05/22 at 10:45am, pt to arrive at [facility] at 9:15am. Pt aware of appts. ?

## 2022-04-11 ENCOUNTER — Ambulatory Visit (AMBULATORY_SURGERY_CENTER): Payer: Commercial Managed Care - PPO | Admitting: *Deleted

## 2022-04-11 VITALS — Ht 74.5 in | Wt 278.0 lb

## 2022-04-11 DIAGNOSIS — Z1211 Encounter for screening for malignant neoplasm of colon: Secondary | ICD-10-CM

## 2022-04-11 MED ORDER — NA SULFATE-K SULFATE-MG SULF 17.5-3.13-1.6 GM/177ML PO SOLN: 1.0000 | ORAL | 0 refills | Status: DC

## 2022-04-11 NOTE — Progress Notes (Signed)
Patient's pre-visit was done today over the phone with the patient. Name,DOB and address verified. Patient denies any allergies to Eggs and Soy. Patient denies any problems with anesthesia/sedation. Difficult intubation per chart 2014 & 2016. Patient is not taking any diet pills or blood thinners. No home Oxygen. Insurance confirmed with patient. ? ?Prep instructions sent to pt's MyChart-pt is aware. Patient understands to call us back with any questions or concerns. Patient is aware of our care-partner policy.  ? ?EMMI education assigned to the patient for the procedure, sent to [facility].  ? ?The patient is COVID-19 vaccinated.   ?

## 2022-05-04 ENCOUNTER — Encounter: Payer: Self-pay | Admitting: Internal Medicine

## 2022-05-05 ENCOUNTER — Encounter (HOSPITAL_COMMUNITY): Payer: Self-pay | Admitting: Internal Medicine

## 2022-05-05 ENCOUNTER — Ambulatory Visit (HOSPITAL_COMMUNITY): Payer: Commercial Managed Care - PPO | Admitting: Certified Registered"

## 2022-05-05 ENCOUNTER — Ambulatory Visit (HOSPITAL_BASED_OUTPATIENT_CLINIC_OR_DEPARTMENT_OTHER): Payer: Commercial Managed Care - PPO | Admitting: Certified Registered"

## 2022-05-05 ENCOUNTER — Ambulatory Visit (HOSPITAL_COMMUNITY)
Admission: RE | Admit: 2022-05-05 | Discharge: 2022-05-05 | Disposition: A | Discharge status: Home / Self Care | Payer: Commercial Managed Care - PPO | Source: Ambulatory Visit | Attending: Internal Medicine | Admitting: Internal Medicine

## 2022-05-05 ENCOUNTER — Encounter (HOSPITAL_COMMUNITY)
Admission: RE | Disposition: A | Discharge status: Home / Self Care | Payer: Self-pay | Source: Ambulatory Visit | Attending: Internal Medicine

## 2022-05-05 ENCOUNTER — Other Ambulatory Visit: Payer: Self-pay

## 2022-05-05 DIAGNOSIS — I10 Essential (primary) hypertension: Secondary | ICD-10-CM

## 2022-05-05 DIAGNOSIS — D120 Benign neoplasm of cecum: Secondary | ICD-10-CM | POA: Diagnosis not present

## 2022-05-05 DIAGNOSIS — K5730 Diverticulosis of large intestine without perforation or abscess without bleeding: Secondary | ICD-10-CM | POA: Insufficient documentation

## 2022-05-05 DIAGNOSIS — Z1211 Encounter for screening for malignant neoplasm of colon: Secondary | ICD-10-CM

## 2022-05-05 DIAGNOSIS — D122 Benign neoplasm of ascending colon: Secondary | ICD-10-CM | POA: Diagnosis not present

## 2022-05-05 DIAGNOSIS — D123 Benign neoplasm of transverse colon: Secondary | ICD-10-CM | POA: Insufficient documentation

## 2022-05-05 DIAGNOSIS — K635 Polyp of colon: Secondary | ICD-10-CM

## 2022-05-05 HISTORY — PX: POLYPECTOMY: SHX5525

## 2022-05-05 HISTORY — PX: COLONOSCOPY WITH PROPOFOL: SHX5780

## 2022-05-05 SURGERY — COLONOSCOPY WITH PROPOFOL
Anesthesia: Monitor Anesthesia Care

## 2022-05-05 MED ORDER — ESMOLOL HCL 100 MG/10ML IV SOLN
INTRAVENOUS | Status: DC | PRN
  Administered 2022-05-05: 30 mg via INTRAVENOUS

## 2022-05-05 MED ORDER — LACTATED RINGERS IV SOLN
INTRAVENOUS | Status: DC
  Administered 2022-05-05: 1000 mL via INTRAVENOUS

## 2022-05-05 MED ORDER — PROPOFOL 10 MG/ML IV BOLUS
INTRAVENOUS | Status: DC | PRN
  Administered 2022-05-05: 60 mg via INTRAVENOUS

## 2022-05-05 MED ORDER — PROPOFOL 500 MG/50ML IV EMUL
INTRAVENOUS | Status: DC | PRN
Start: 2022-05-05 — End: 2022-05-05
  Administered 2022-05-05: 100 ug/kg/min via INTRAVENOUS

## 2022-05-05 MED ORDER — LIDOCAINE 2% (20 MG/ML) 5 ML SYRINGE
INTRAMUSCULAR | Status: DC | PRN
Start: 2022-05-05 — End: 2022-05-05
  Administered 2022-05-05: 60 mg via INTRAVENOUS

## 2022-05-05 SURGICAL SUPPLY — 22 items

## 2022-05-05 NOTE — Op Note (Signed)
[facility] ?Patient Name: Nicholas Stevens ?Procedure Date: 05/05/2022 ?MRN: [id number] ?Attending MD: Jerene Bears , MD ?Date of Birth: 1971-12-07 ?CSN: [id number] ?Age: 52 ?Admit Type: Outpatient ?Procedure:                Colonoscopy ?Indications:              Screening for colorectal malignant neoplasm, This  ?                          is the patient's first colonoscopy ?Providers:                Lajuan Lines. Dong Nimmons, MD, Allayne Gitelman, RN, Frazier Richards,  ?                          Technician ?Referring MD:             Evie Lacks. Plotnikov, MD ?Medicines:                Monitored Anesthesia Care ?Complications:            No immediate complications. ?Estimated Blood Loss:     Estimated blood loss: none. ?Procedure:                Pre-Anesthesia Assessment: ?                          - Prior to the procedure, a History and Physical  ?                          was performed, and patient medications and  ?                          allergies were reviewed. The patient's tolerance of  ?                          previous anesthesia was also reviewed. The risks  ?                          and benefits of the procedure and the sedation  ?                          options and risks were discussed with the patient.  ?                          All questions were answered, and informed consent  ?                          was obtained. Prior Anticoagulants: The patient has  ?                          taken no previous anticoagulant or antiplatelet  ?                          agents. ASA Grade Assessment: II - A patient with  ?  mild systemic disease. After reviewing the risks  ?                          and benefits, the patient was deemed in  ?                          satisfactory condition to undergo the procedure. ?                          After obtaining informed consent, the colonoscope  ?                          was passed under direct vision. Throughout the  ?                           procedure, the patient's blood pressure, pulse, and  ?                          oxygen saturations were monitored continuously. The  ?                          CF-HQ190L ([id number]) Olympus colonoscope was  ?                          introduced through the anus and advanced to the  ?                          cecum, identified by appendiceal orifice and  ?                          ileocecal valve. The colonoscopy was performed  ?                          without difficulty. The patient tolerated the  ?                          procedure well. The quality of the bowel  ?                          preparation was excellent. The ileocecal valve,  ?                          appendiceal orifice, and rectum were photographed. ?Scope In: 10:29:56 AM ?Scope Out: 10:45:01 AM ?Scope Withdrawal Time: 0 hours 13 minutes 48 seconds  ?Total Procedure Duration: 0 hours 15 minutes 5 seconds  ?Findings: ?     The digital rectal exam was normal. ?     A 3 mm polyp was found in the cecum. The polyp was sessile. The polyp  ?     was removed with a cold snare. Resection and retrieval were complete. ?     A 4 mm polyp was found in the ascending colon. The polyp was sessile.  ?     The polyp was removed with a cold snare. Resection and retrieval were  ?     complete. ?  A 6 mm polyp was found in the proximal transverse colon. The polyp was  ?     sessile. The polyp was removed with a cold snare. Resection and  ?     retrieval were complete. ?     A few small-mouthed diverticula were found in the sigmoid colon. ?     The retroflexed view of the distal rectum and anal verge was normal and  ?     showed no anal or rectal abnormalities. ?Impression:               - One 3 mm polyp in the cecum, removed with a cold  ?                          snare. Resected and retrieved. ?                          - One 4 mm polyp in the ascending colon, removed  ?                          with a cold snare. Resected and retrieved. ?                           - One 6 mm polyp in the proximal transverse colon,  ?                          removed with a cold snare. Resected and retrieved. ?                          - Diverticulosis in the sigmoid colon. ?                          - The distal rectum and anal verge are normal on  ?                          retroflexion view. ?Moderate Sedation: ?     N/A ?Recommendation:           - Patient has a contact number available for  ?                          emergencies. The signs and symptoms of potential  ?                          delayed complications were discussed with the  ?                          patient. Return to normal activities tomorrow.  ?                          Written discharge instructions were provided to the  ?                          patient. ?                          - Resume previous  diet. ?                          - Continue present medications. ?                          - Await pathology results. ?                          - Repeat colonoscopy is recommended for  ?                          surveillance. The colonoscopy date will be  ?                          determined after pathology results from today's  ?                          exam become available for review. ?Procedure Code(s):        --- Professional --- ?                          [phone], Colonoscopy, flexible; with removal of  ?                          tumor(s), polyp(s), or other lesion(s) by snare  ?                          technique ?Diagnosis Code(s):        --- Professional --- ?                          Z12.11, Encounter for screening for malignant  ?                          neoplasm of colon ?                          K63.5, Polyp of colon ?                          K57.30, Diverticulosis of large intestine without  ?                          perforation or abscess without bleeding ?CPT copyright 2019 American Medical Association. All rights reserved. ?The codes documented in this report are preliminary and upon coder review may   ?be revised to meet current compliance requirements. ?Jerene Bears, MD ?05/05/2022 10:47:56 AM ?This report has been signed electronically. ?Number of Addenda: 0 ?

## 2022-05-05 NOTE — Transfer of Care (Signed)
Immediate Anesthesia Transfer of Care Note ? ?Patient: Nicholas Stevens ? ?Procedure(s) Performed: COLONOSCOPY WITH PROPOFOL ?POLYPECTOMY ? ?Patient Location: PACU ? ?Anesthesia Type:MAC ? ?Level of Consciousness: awake, alert , oriented and patient cooperative ? ?Airway & Oxygen Therapy: Patient Spontanous Breathing and Patient connected to face mask oxygen ? ?Post-op Assessment: Report given to RN and Post -op Vital signs reviewed and stable ? ?Post vital signs: Reviewed and stable ? ?Last Vitals:  ?Vitals Value Taken Time  ?BP    ?Temp    ?Pulse 82 05/05/22 1051  ?Resp 15 05/05/22 1051  ?SpO2 100 % 05/05/22 1051  ?Vitals shown include unvalidated device data. ? ?Last Pain:  ?Vitals:  ? 05/05/22 0955  ?TempSrc: Temporal  ?PainSc: 0-No pain  ?   ? ?  ? ?Complications: No notable events documented. ?

## 2022-05-05 NOTE — H&P (Signed)
?  ? ?GASTROENTEROLOGY PROCEDURE H&P NOTE  ? ?Primary Care Physician: ?Plotnikov, Evie Lacks, MD ? ? ? ?Reason for Procedure:  Colon cancer screening ? ?Plan:    Colonoscopy ? ?Patient is appropriate for endoscopic procedure(s) in the outpatient hospital setting due to history of difficult intubation. ? ?The nature of the procedure, as well as the risks, benefits, and alternatives were carefully and thoroughly reviewed with the patient. Ample time for discussion and questions allowed. The patient understood, was satisfied, and agreed to proceed.  ? ? ? ?HPI: ?Nicholas Stevens is a 51 y.o. male who presents for colonoscopy.  Medical history as below.  Tolerated the prep.  No recent chest pain or shortness of breath.  No abdominal pain today. ? ?Past Medical History:  ?Diagnosis Date  ? DDD (degenerative disc disease), lumbar   ? HNP (herniated nucleus pulposus)   ? Hypertension   ? Liver hemangioma   ? Morbid obesity ([place])   ? OSA (obstructive sleep apnea)   ? no cpap used since weight loss  ? Overweight(278.02)   ? Plantar fasciitis of right foot   ? Sleep apnea   ? Tinea barbae   ? ? ?Past Surgical History:  ?Procedure Laterality Date  ? BREATH TEK H PYLORI N/A 08/20/2013  ? Procedure: BREATH TEK H PYLORI;  Surgeon: Pedro Earls, MD;  Location: [facility];  Service: General;  Laterality: N/A;  ? LAPAROSCOPIC APPENDECTOMY  2007  ? Dr Marlou Starks  ? LAPAROSCOPIC GASTRIC SLEEVE RESECTION N/A 10/08/2013  ? Procedure: LAPAROSCOPIC SLEEVE GASTRECTOMY;  Surgeon: Pedro Earls, MD;  Location: [facility];  Service: General;  Laterality: N/A;  ? LUMBAR LAMINECTOMY/DECOMPRESSION MICRODISCECTOMY N/A 09/16/2015  ? Procedure: MICRO LUMBAR DECOMPRESSION, MICRODISCECTOMY L5 - S1;  Surgeon: Susa Day, MD;  Location: [facility];  Service: Orthopedics;  Laterality: N/A;  ? ROTATOR CUFF REPAIR Right 2005  ? Dr Sharol Given  ? ? ?Prior to Admission medications   ?Medication Sig Start Date End Date Taking? Authorizing Provider  ?anastrozole  (ARIMIDEX) 1 MG tablet Take 1 mg by mouth once a week. 02/10/22  Yes [provider]  ?Cholecalciferol (VITAMIN D3) 2000 units capsule Take 1 capsule (2,000 Units total) daily by mouth. 11/03/17  Yes Plotnikov, Evie Lacks, MD  ?Cyanocobalamin (B-12 PO) Take 1 tablet by mouth daily.   Yes [provider]  ?Ginger, Zingiber officinalis, (GINGER PO) Take 1 capsule by mouth daily.   Yes [provider]  ?latanoprost (XALATAN) 0.005 % ophthalmic solution Place 1 drop into both eyes at bedtime.   Yes [provider]  ?Multiple Vitamin (MULTIVITAMIN WITH MINERALS) TABS tablet Take 1 tablet by mouth daily.   Yes [provider]  ?Omega-3 Fatty Acids (FISH OIL) 1000 MG CAPS Take 1,000 mg by mouth daily.   Yes [provider]  ?tadalafil (CIALIS) 20 MG tablet TAKE 1 TABLET BY MOUTH DAILY AS NEEDED FOR ERECTILE DYSFUNCTION 09/18/21  Yes Plotnikov, Evie Lacks, MD  ?testosterone cypionate (DEPOTESTOSTERONE CYPIONATE) 200 MG/ML injection Inject into the muscle once a week. 08/11/21  Yes [provider]  ?triamterene-hydrochlorothiazide (DYAZIDE) 37.5-25 MG capsule TAKE 1 CAPSULE BY MOUTH DAILY 11/17/21  Yes Plotnikov, Evie Lacks, MD  ?TURMERIC PO Take 1 capsule by mouth daily.   Yes [provider]  ?zolpidem (AMBIEN) 10 MG tablet TAKE 1 TABLET BY MOUTH EVERYDAY AT BEDTIME 01/04/22  Yes Plotnikov, Evie Lacks, MD  ?Na Sulfate-K Sulfate-Mg Sulf 17.5-3.13-1.6 GM/177ML SOLN Take 1 kit by mouth as directed. May  use generic Suprep 04/11/22   Liston Thum, Lajuan Lines, MD  ? ? ?Current Facility-Administered Medications  ?Medication Dose Route Frequency Provider Last Rate Last Admin  ? lactated ringers infusion   Intravenous Continuous Kyara Boxer, Lajuan Lines, MD 10 mL/hr at 05/05/22 1002 1,000 mL at 05/05/22 1002  ? ? ?Allergies as of 03/03/2022 - Review Complete 08/04/2021  ?Allergen Reaction Noted  ? No known allergies  10/26/2020  ? ? ?Family History  ?Problem Relation Age of Onset  ? Other  Mother   ?     hx of DJD  ? Liver disease Father   ?     ETOH, Hep C  ? Hypertension Father   ? Colon cancer Neg Hx   ? Colon polyps Neg Hx   ? Esophageal cancer Neg Hx   ? Rectal cancer Neg Hx   ? Stomach cancer Neg Hx   ? ? ?Social History  ? ?Socioeconomic History  ? Marital status: Married  ?  Spouse name: Damyn Weitzel  ? Number of children: 3  ? Years of education: Not on file  ? Highest education level: Not on file  ?Occupational History  ? Occupation: [employer]  ?  Employer: UNEMPLOYED  ?Tobacco Use  ? Smoking status: Never  ? Smokeless tobacco: Never  ?Vaping Use  ? Vaping Use: Never used  ?Substance and Sexual Activity  ? Alcohol use: No  ? Drug use: No  ? Sexual activity: Not on file  ?Other Topics Concern  ? Not on file  ?Social History Narrative  ? Married 18+ years  ? 3 children - ages approx 46, 9 and 5...daughter age 74 w/ DM type 1  ? ?Social Determinants of Health  ? ?Financial Resource Strain: Not on file  ?Food Insecurity: Not on file  ?Transportation Needs: Not on file  ?Physical Activity: Not on file  ?Stress: Not on file  ?Social Connections: Not on file  ?Intimate Partner Violence: Not on file  ? ? ?Physical Exam: ?Vital signs in last 24 hours: ?$RemoveBe'@BP'agPFZWXRO$  (!) 156/92   Pulse 69   Temp 98.4 ?F (36.9 ?C) (Temporal)   Resp 14   Ht 6' 2.5" (1.892 m)   Wt 123.4 kg   SpO2 100%   BMI 34.46 kg/m?  ?GEN: NAD ?EYE: Sclerae anicteric ?ENT: MMM ?CV: Non-tachycardic ?Pulm: CTA b/l ?GI: Soft, NT/ND ?NEURO:  Alert & Oriented x 3 ? ? ?Zenovia Jarred, MD ?[facility] ? ?05/05/2022 10:19 AM ? ?

## 2022-05-05 NOTE — Anesthesia Postprocedure Evaluation (Signed)
Anesthesia Post Note ? ?Patient: GLENWOOD REVOIR ? ?Procedure(s) Performed: COLONOSCOPY WITH PROPOFOL ?POLYPECTOMY ? ?  ? ?Patient location during evaluation: Endoscopy ?Anesthesia Type: MAC ?Level of consciousness: awake and alert ?Pain management: pain level controlled ?Vital Signs Assessment: post-procedure vital signs reviewed and stable ?Respiratory status: spontaneous breathing, nonlabored ventilation and respiratory function stable ?Cardiovascular status: blood pressure returned to baseline and stable ?Postop Assessment: no apparent nausea or vomiting ?Anesthetic complications: no ? ? ?No notable events documented. ? ?Last Vitals:  ?Vitals:  ? 05/05/22 1111 05/05/22 1121  ?BP: (!) 176/142 (!) 168/107  ?Pulse: 81   ?Resp: 16   ?Temp:    ?SpO2: 99%   ?  ?Last Pain:  ?Vitals:  ? 05/05/22 1121  ?TempSrc:   ?PainSc: 0-No pain  ? ? ?  ?  ?  ?  ?  ?  ? ?Merlinda Frederick ? ? ? ? ?

## 2022-05-05 NOTE — Discharge Instructions (Signed)

## 2022-05-05 NOTE — Anesthesia Preprocedure Evaluation (Signed)
Anesthesia Evaluation  ?Patient identified by MRN, date of birth, ID band ?Patient awake ? ? ? ?Reviewed: ?Allergy & Precautions, NPO status , Patient's Chart, lab work & pertinent test results ? ?Airway ?Mallampati: III ? ?TM Distance: >3 FB ?Neck ROM: Full ? ? ? Dental ?no notable dental hx. ? ?  ?Pulmonary ?sleep apnea ,  ?  ?Pulmonary exam normal ?breath sounds clear to auscultation ? ? ? ? ? ? Cardiovascular ?hypertension, Pt. on medications ?Normal cardiovascular exam ?Rhythm:Regular Rate:Normal ? ? ?  ?Neuro/Psych ?negative neurological ROS ? negative psych ROS  ? GI/Hepatic ?negative GI ROS, Neg liver ROS,   ?Endo/Other  ?negative endocrine ROS ? Renal/GU ?negative Renal ROS  ?negative genitourinary ?  ?Musculoskeletal ? ?(+) Arthritis , Osteoarthritis,   ? Abdominal ?  ?Peds ?negative pediatric ROS ?(+)  Hematology ?negative hematology ROS ?(+)   ?Anesthesia Other Findings ? ? Reproductive/Obstetrics ?negative OB ROS ? ?  ? ? ? ? ? ? ? ? ? ? ? ? ? ?  ?  ? ? ? ? ? ? ? ? ?Anesthesia Physical ?Anesthesia Plan ? ?ASA: 3 ? ?Anesthesia Plan: MAC  ? ?Post-op Pain Management:   ? ?Induction: Intravenous ? ?PONV Risk Score and Plan: 1 and Propofol infusion, TIVA and Treatment may vary due to age or medical condition ? ?Airway Management Planned: Natural Airway and Simple Face Mask ? ?Additional Equipment: None ? ?Intra-op Plan:  ? ?Post-operative Plan: Extubation in OR ? ?Informed Consent: I have reviewed the patients History and Physical, chart, labs and discussed the procedure including the risks, benefits and alternatives for the proposed anesthesia with the patient or authorized representative who has indicated his/her understanding and acceptance.  ? ? ? ?Dental advisory given ? ?Plan Discussed with: CRNA and Anesthesiologist ? ?Anesthesia Plan Comments:   ? ? ? ? ? ? ?Anesthesia Quick Evaluation ? ?

## 2022-05-06 LAB — SURGICAL PATHOLOGY

## 2022-05-09 ENCOUNTER — Encounter: Payer: Self-pay | Admitting: Internal Medicine

## 2022-05-30 ENCOUNTER — Encounter: Payer: Self-pay | Admitting: Internal Medicine

## 2022-05-30 DIAGNOSIS — G4733 Obstructive sleep apnea (adult) (pediatric): Secondary | ICD-10-CM

## 2022-06-08 NOTE — Telephone Encounter (Signed)
Faxed over DME for CPAP to [facility] [phone].Marland KitchenJohny Chess

## 2022-06-08 NOTE — Telephone Encounter (Signed)
Generated DME for CPAP machine faxed over to [facility]Marland KitchenJohny Stevens

## 2022-06-09 ENCOUNTER — Other Ambulatory Visit: Payer: Self-pay | Admitting: Internal Medicine

## 2022-06-09 NOTE — Telephone Encounter (Signed)
Nicholas Stevens with [facility] calls today regarding this RX. She states that she needs more information before proceeding with getting PT the CPAP. States there were no settings, no diagnostics on PT's sleep study, PT's insurance was not listed, and they do not have any clinical documents on hand.  She also states that the documents need to say "Patients is using and benefiting from the CPAP machine and is compliant"  CB at: [phone] FAX: [phone]

## 2022-06-13 NOTE — Telephone Encounter (Signed)
Check [place] registry last filled 05/12/2022.Marland KitchenJohny Stevens

## 2022-06-13 NOTE — Telephone Encounter (Signed)
Pt called in and states that he is out of the medication.   Requesting a call back from assistant in regards to refill.

## 2022-06-14 NOTE — Telephone Encounter (Signed)
Pt is wanting a referral for sleep study.Marland KitchenJohny Stevens

## 2022-06-15 ENCOUNTER — Other Ambulatory Visit: Payer: Self-pay | Admitting: Internal Medicine

## 2022-06-15 DIAGNOSIS — G4733 Obstructive sleep apnea (adult) (pediatric): Secondary | ICD-10-CM

## 2022-06-23 ENCOUNTER — Ambulatory Visit: Payer: Commercial Managed Care - PPO | Admitting: Internal Medicine

## 2022-07-27 ENCOUNTER — Encounter: Payer: Self-pay | Admitting: Internal Medicine

## 2022-07-27 ENCOUNTER — Ambulatory Visit: Payer: Commercial Managed Care - PPO | Admitting: Internal Medicine

## 2022-07-27 VITALS — BP 130/80 | HR 86 | Temp 99.0°F | Ht 74.5 in | Wt 282.0 lb

## 2022-07-27 DIAGNOSIS — E291 Testicular hypofunction: Secondary | ICD-10-CM

## 2022-07-27 DIAGNOSIS — I10 Essential (primary) hypertension: Secondary | ICD-10-CM | POA: Diagnosis not present

## 2022-07-27 DIAGNOSIS — G4700 Insomnia, unspecified: Secondary | ICD-10-CM

## 2022-07-27 DIAGNOSIS — Z9884 Bariatric surgery status: Secondary | ICD-10-CM

## 2022-07-27 MED ORDER — ZOLPIDEM TARTRATE 10 MG PO TABS: ORAL_TABLET | ORAL | 1 refills | Status: DC

## 2022-07-27 NOTE — Assessment & Plan Note (Signed)
He goes to [facility] for weekly testosterone inj and weekly Arimidex 1/2 tab + labs

## 2022-07-27 NOTE — Assessment & Plan Note (Signed)
Cont w/vitamins

## 2022-07-27 NOTE — Progress Notes (Signed)
Subjective:  Patient ID: Nicholas Stevens, male    DOB: 1971/08/13  Age: 51 y.o. MRN: [id number]  CC: No chief complaint on file.   HPI Nicholas Stevens presents for OSA, insomnia, hypogonadism  Outpatient Medications Prior to Visit  Medication Sig Dispense Refill   anastrozole (ARIMIDEX) 1 MG tablet Take 1 mg by mouth once a week.     Cholecalciferol (VITAMIN D3) 2000 units capsule Take 1 capsule (2,000 Units total) daily by mouth. 100 capsule 3   Cyanocobalamin (B-12 PO) Take 1 tablet by mouth daily.     Ginger, Zingiber officinalis, (GINGER PO) Take 1 capsule by mouth daily.     latanoprost (XALATAN) 0.005 % ophthalmic solution Place 1 drop into both eyes at bedtime.     LORazepam (ATIVAN) 1 MG tablet Take 1 tablet to 2 tablets 90 minutes prior to MRI     Multiple Vitamin (MULTIVITAMIN WITH MINERALS) TABS tablet Take 1 tablet by mouth daily.     Omega-3 Fatty Acids (FISH OIL) 1000 MG CAPS Take 1,000 mg by mouth daily.     pantoprazole (PROTONIX) 40 MG tablet Take 1 tablet by mouth daily.     tadalafil (CIALIS) 20 MG tablet TAKE 1 TABLET BY MOUTH DAILY AS NEEDED FOR ERECTILE DYSFUNCTION 12 tablet 5   testosterone cypionate (DEPOTESTOSTERONE CYPIONATE) 200 MG/ML injection Inject into the muscle once a week.     triamterene-hydrochlorothiazide (DYAZIDE) 37.5-25 MG capsule TAKE 1 CAPSULE BY MOUTH DAILY 90 capsule 3   TURMERIC PO Take 1 capsule by mouth daily.     zolpidem (AMBIEN) 10 MG tablet TAKE 1 TABLET BY MOUTH EVERYDAY AT BEDTIME 30 tablet 0   No facility-administered medications prior to visit.    ROS: Review of Systems  Constitutional:  Positive for fatigue. Negative for appetite change and unexpected weight change.  HENT:  Negative for congestion, nosebleeds, sneezing, sore throat and trouble swallowing.   Eyes:  Negative for itching and visual disturbance.  Respiratory:  Negative for cough.   Cardiovascular:  Negative for chest pain, palpitations and leg swelling.   Gastrointestinal:  Negative for abdominal distention, blood in stool, diarrhea and nausea.  Genitourinary:  Negative for frequency and hematuria.  Musculoskeletal:  Positive for gait problem. Negative for back pain, joint swelling and neck pain.  Skin:  Negative for rash.  Neurological:  Negative for dizziness, tremors, speech difficulty and weakness.  Psychiatric/Behavioral:  Negative for agitation, dysphoric mood, sleep disturbance and suicidal ideas. The patient is not nervous/anxious.     Objective:  BP 130/80 (BP Location: Left Arm, Patient Position: Sitting, Cuff Size: Normal)   Pulse 86   Temp 99 F (37.2 C) (Oral)   Ht 6' 2.5" (1.892 m)   Wt 282 lb (127.9 kg)   SpO2 97%   BMI 35.72 kg/m   BP Readings from Last 3 Encounters:  07/27/22 130/80  05/05/22 (!) 168/107  08/04/21 130/82    Wt Readings from Last 3 Encounters:  07/27/22 282 lb (127.9 kg)  05/05/22 272 lb (123.4 kg)  04/11/22 278 lb (126.1 kg)    Physical Exam Constitutional:      General: He is not in acute distress.    Appearance: He is well-developed. He is obese.     Comments: NAD  Eyes:     Conjunctiva/sclera: Conjunctivae normal.     Pupils: Pupils are equal, round, and reactive to light.  Neck:     Thyroid: No thyromegaly.     Vascular: No JVD.  Cardiovascular:     Rate and Rhythm: Normal rate and regular rhythm.     Heart sounds: Normal heart sounds. No murmur heard.    No friction rub. No gallop.  Pulmonary:     Effort: Pulmonary effort is normal. No respiratory distress.     Breath sounds: Normal breath sounds. No wheezing or rales.  Chest:     Chest wall: No tenderness.  Abdominal:     General: Bowel sounds are normal. There is no distension.     Palpations: Abdomen is soft. There is no mass.     Tenderness: There is no abdominal tenderness. There is no guarding or rebound.  Musculoskeletal:        General: No tenderness. Normal range of motion.     Cervical back: Normal range of  motion.  Lymphadenopathy:     Cervical: No cervical adenopathy.  Skin:    General: Skin is warm and dry.     Findings: No rash.  Neurological:     Mental Status: He is alert and oriented to person, place, and time.     Cranial Nerves: No cranial nerve deficit.     Motor: No abnormal muscle tone.     Coordination: Coordination normal.     Gait: Gait normal.     Deep Tendon Reflexes: Reflexes are normal and symmetric.  Psychiatric:        Behavior: Behavior normal.        Thought Content: Thought content normal.        Judgment: Judgment normal.     Lab Results  Component Value Date   WBC 3.8 (L) 08/04/2021   HGB 13.4 08/04/2021   HCT 40.7 08/04/2021   PLT 234.0 08/04/2021   GLUCOSE 79 08/04/2021   CHOL 158 08/04/2021   TRIG 80.0 08/04/2021   HDL 40.20 08/04/2021   LDLDIRECT 116.0 10/26/2020   LDLCALC 102 (H) 08/04/2021   ALT 23 08/04/2021   AST 22 08/04/2021   NA 142 08/04/2021   K 4.4 08/04/2021   CL 102 08/04/2021   CREATININE 1.13 08/04/2021   BUN 13 08/04/2021   CO2 23 08/04/2021   TSH 0.72 08/04/2021   PSA 0.61 08/04/2021    No results found.  Assessment & Plan:   Problem List Items Addressed This Visit     HTN (hypertension)    Cont on Triamt/HCTZ      Relevant Orders   TSH   Urinalysis   CBC with Differential/Platelet   Lipid panel   PSA   Comprehensive metabolic panel   Testosterone   Hypogonadism, male - Primary    He goes to [facility] for weekly testosterone inj and weekly Arimidex 1/2 tab + labs      Relevant Orders   TSH   Urinalysis   CBC with Differential/Platelet   Lipid panel   PSA   Comprehensive metabolic panel   Testosterone   Insomnia   Relevant Medications   zolpidem (AMBIEN) 10 MG tablet   Other Relevant Orders   TSH   Urinalysis   CBC with Differential/Platelet   Lipid panel   PSA   Comprehensive metabolic panel   Testosterone   Laparoscopic sleeve gastrectomy Oct 2014    Cont w/vitamins         Meds  ordered this encounter  Medications   zolpidem (AMBIEN) 10 MG tablet    Sig: TAKE 1 TABLET BY MOUTH EVERYDAY AT BEDTIME    Dispense:  90 tablet    Refill:  1    Not to exceed 3 additional fills before 07/03/2022      Follow-up: Return in about 6 months (around 01/27/2023) for Wellness Exam.  Nicholas Kehr, MD

## 2022-07-27 NOTE — Assessment & Plan Note (Signed)
Cont on Triamt/HCTZ 

## 2022-08-25 ENCOUNTER — Encounter: Payer: Self-pay | Admitting: Pulmonary Disease

## 2022-08-25 ENCOUNTER — Ambulatory Visit (INDEPENDENT_AMBULATORY_CARE_PROVIDER_SITE_OTHER): Payer: Commercial Managed Care - PPO | Admitting: Pulmonary Disease

## 2022-08-25 DIAGNOSIS — G4733 Obstructive sleep apnea (adult) (pediatric): Secondary | ICD-10-CM

## 2022-08-25 NOTE — Addendum Note (Signed)
Addended by: Vanessa Barbara on: 08/25/2022 03:18 PM   Modules accepted: Orders

## 2022-08-25 NOTE — Patient Instructions (Signed)
X prescription for auto CPAP 8 to 12 cm [facility]  X schedule home sleep test

## 2022-08-25 NOTE — Assessment & Plan Note (Signed)
I have reviewed his prior sleep study from 2008.  He has lost 30 pounds since then but still has significant OSA based on history. We will go ahead and provide him with a replacement auto CPAP 8 to 12 cm.  Previously he needed pressure of 10 cm. We will obtain download after 1 month of use and tweak settings as needed.  He was very compliant and CPAP is certainly helped improve his daytime somnolence and fatigue  Weight loss encouraged, compliance with goal of at least 4-6 hrs every night is the expectation. Advised against medications with sedative side effects Cautioned against driving when sleepy - understanding that sleepiness will vary on a day to day basis
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
## 2022-08-31 ENCOUNTER — Telehealth: Payer: Self-pay | Admitting: Nurse Practitioner

## 2022-09-02 NOTE — Telephone Encounter (Signed)
According to Dr. Bari Mantis note, he reviewed sleep study from 2008. I am not able to locate that study in epic.    Dr. Elsworth Soho, do you have a copy of this sleep study?

## 2022-09-06 NOTE — Telephone Encounter (Addendum)
I have sent community message to [place] [facility] with this info.  Nothing further needed on this message.

## 2022-09-06 NOTE — Telephone Encounter (Signed)
Rigoberto Noel, MD  Claudette Head A, CMA; [facility] Triage Pool 3 hours ago (8:19 AM)    Chart review >> notes >> scroll down to 04/27/2011 to locate study     Routing to PCCs.

## 2022-09-29 ENCOUNTER — Encounter: Payer: Self-pay | Admitting: Pulmonary Disease

## 2022-11-10 ENCOUNTER — Encounter: Payer: Self-pay | Admitting: Internal Medicine

## 2022-11-11 MED ORDER — TRIAMTERENE-HCTZ 37.5-25 MG PO CAPS
ORAL_CAPSULE | ORAL | 2 refills | Status: DC
Start: 2022-11-11 — End: 2023-08-17

## 2022-12-08 ENCOUNTER — Ambulatory Visit: Payer: Commercial Managed Care - PPO | Admitting: Nurse Practitioner

## 2022-12-13 ENCOUNTER — Ambulatory Visit: Payer: Commercial Managed Care - PPO | Admitting: Primary Care

## 2023-01-19 ENCOUNTER — Ambulatory Visit (INDEPENDENT_AMBULATORY_CARE_PROVIDER_SITE_OTHER): Payer: Commercial Managed Care - PPO

## 2023-01-19 ENCOUNTER — Encounter: Payer: Self-pay | Admitting: Internal Medicine

## 2023-01-19 ENCOUNTER — Telehealth: Payer: Self-pay | Admitting: Internal Medicine

## 2023-01-19 ENCOUNTER — Ambulatory Visit: Payer: Commercial Managed Care - PPO | Admitting: Internal Medicine

## 2023-01-19 VITALS — BP 130/82 | HR 75 | Temp 98.0°F | Ht 74.5 in | Wt 283.0 lb

## 2023-01-19 DIAGNOSIS — I10 Essential (primary) hypertension: Secondary | ICD-10-CM

## 2023-01-19 DIAGNOSIS — Z6835 Body mass index (BMI) 35.0-35.9, adult: Secondary | ICD-10-CM

## 2023-01-19 DIAGNOSIS — G4700 Insomnia, unspecified: Secondary | ICD-10-CM

## 2023-01-19 DIAGNOSIS — E66812 Obesity, class 2: Secondary | ICD-10-CM

## 2023-01-19 DIAGNOSIS — J399 Disease of upper respiratory tract, unspecified: Secondary | ICD-10-CM | POA: Diagnosis not present

## 2023-01-19 DIAGNOSIS — E291 Testicular hypofunction: Secondary | ICD-10-CM

## 2023-01-19 DIAGNOSIS — L709 Acne, unspecified: Secondary | ICD-10-CM | POA: Insufficient documentation

## 2023-01-19 DIAGNOSIS — E669 Obesity, unspecified: Secondary | ICD-10-CM | POA: Insufficient documentation

## 2023-01-19 LAB — URINALYSIS
Bilirubin Urine: NEGATIVE
Hgb urine dipstick: NEGATIVE
Ketones, ur: NEGATIVE
Leukocytes,Ua: NEGATIVE
Nitrite: NEGATIVE
Specific Gravity, Urine: 1.015 (ref 1.000–1.030)
Total Protein, Urine: NEGATIVE
Urine Glucose: NEGATIVE
Urobilinogen, UA: 0.2 (ref 0.0–1.0)
pH: 7 (ref 5.0–8.0)

## 2023-01-19 LAB — COMPREHENSIVE METABOLIC PANEL
ALT: 35 U/L (ref 0–53)
AST: 25 U/L (ref 0–37)
Albumin: 4.7 g/dL (ref 3.5–5.2)
Alkaline Phosphatase: 52 U/L (ref 39–117)
BUN: 12 mg/dL (ref 6–23)
CO2: 33 mEq/L — ABNORMAL HIGH (ref 19–32)
Calcium: 10.3 mg/dL (ref 8.4–10.5)
Chloride: 98 mEq/L (ref 96–112)
Creatinine, Ser: 1.02 mg/dL (ref 0.40–1.50)
GFR: 84.07 mL/min (ref >60.00)
Glucose, Bld: 93 mg/dL (ref 70–99)
Potassium: 4.5 mEq/L (ref 3.5–5.1)
Sodium: 139 mEq/L (ref 135–145)
Total Bilirubin: 0.7 mg/dL (ref 0.2–1.2)
Total Protein: 8.3 g/dL (ref 6.0–8.3)

## 2023-01-19 LAB — PSA: PSA: 1.01 ng/mL (ref 0.10–4.00)

## 2023-01-19 LAB — CBC WITH DIFFERENTIAL/PLATELET
Basophils Absolute: 0 10*3/uL (ref 0.0–0.1)
Basophils Relative: 1.1 % (ref 0.0–3.0)
Eosinophils Absolute: 0.2 10*3/uL (ref 0.0–0.7)
Eosinophils Relative: 5.1 % — ABNORMAL HIGH (ref 0.0–5.0)
HCT: 47.6 % (ref 39.0–52.0)
Hemoglobin: 15.6 g/dL (ref 13.0–17.0)
Lymphocytes Relative: 36.7 % (ref 12.0–46.0)
Lymphs Abs: 1.2 10*3/uL (ref 0.7–4.0)
MCHC: 32.7 g/dL (ref 30.0–36.0)
MCV: 74.4 fl — ABNORMAL LOW (ref 78.0–100.0)
Monocytes Absolute: 0.4 10*3/uL (ref 0.1–1.0)
Monocytes Relative: 11.3 % (ref 3.0–12.0)
Neutro Abs: 1.5 10*3/uL (ref 1.4–7.7)
Neutrophils Relative %: 45.8 % (ref 43.0–77.0)
Platelets: 255 10*3/uL (ref 150.0–400.0)
RBC: 6.4 Mil/uL — ABNORMAL HIGH (ref 4.22–5.81)
RDW: 17.7 % — ABNORMAL HIGH (ref 11.5–15.5)
WBC: 3.3 10*3/uL — ABNORMAL LOW (ref 4.0–10.5)

## 2023-01-19 LAB — LIPID PANEL
Cholesterol: 171 mg/dL (ref 0–200)
HDL: 29.1 mg/dL — ABNORMAL LOW (ref >39.00)
LDL Cholesterol: 119 mg/dL — ABNORMAL HIGH (ref 0–99)
NonHDL: 142.19
Total CHOL/HDL Ratio: 6
Triglycerides: 117 mg/dL (ref 0.0–149.0)
VLDL: 23.4 mg/dL (ref 0.0–40.0)

## 2023-01-19 LAB — TSH: TSH: 0.84 u[IU]/mL (ref 0.35–5.50)

## 2023-01-19 LAB — TESTOSTERONE: Testosterone: 1074.55 ng/dL — ABNORMAL HIGH (ref 300.00–890.00)

## 2023-01-19 MED ORDER — HYDROCODONE BIT-HOMATROP MBR 5-1.5 MG/5ML PO SOLN: 5.0000 mL | Freq: Four times a day (QID) | ORAL | 0 refills | Status: DC | PRN

## 2023-01-19 MED ORDER — WEGOVY 1 MG/0.5ML ~~LOC~~ SOAJ: 1.0000 mg | SUBCUTANEOUS | 3 refills | Status: DC

## 2023-01-19 MED ORDER — CEFDINIR 300 MG PO CAPS: 300.0000 mg | ORAL_CAPSULE | Freq: Two times a day (BID) | ORAL | 0 refills | Status: DC

## 2023-01-19 NOTE — Assessment & Plan Note (Signed)
?  CAP Start Cefdinir Hycodan prn CXR

## 2023-01-19 NOTE — Assessment & Plan Note (Signed)
Zolpidem prn 

## 2023-01-19 NOTE — Assessment & Plan Note (Signed)
Cont on Triamt/HCTZ

## 2023-01-19 NOTE — Telephone Encounter (Signed)
Patient called and said that the hycodan cough syrup is out of stock.  Patient called 5 different pharmacies and could not find it.  Please have Dr. Alain Marion [facility] [address].  Patient was here today

## 2023-01-19 NOTE — Assessment & Plan Note (Signed)
Wegovy

## 2023-01-19 NOTE — Telephone Encounter (Signed)
Submitted PA w/ (Key: [id number]) PA sent to [facility].Marland KitchenJohny Chess

## 2023-01-19 NOTE — Progress Notes (Signed)
Subjective:  Patient ID: Nicholas Stevens, male    DOB: [birth year]  Age: 52 y.o. MRN: [id number]  CC: chest congestion  (Having cough and nasal drainage )   HPI Nicholas Stevens presents for COVID x 5 d - Dec 23, 2022 C/o sinusitis, cough Complains of obesity, insomnia  Outpatient Medications Prior to Visit  Medication Sig Dispense Refill   anastrozole (ARIMIDEX) 1 MG tablet Take 1 mg by mouth once a week.     Cholecalciferol (VITAMIN D3) 2000 units capsule Take 1 capsule (2,000 Units total) daily by mouth. 100 capsule 3   Cyanocobalamin (B-12 PO) Take 1 tablet by mouth daily.     Ginger, Zingiber officinalis, (GINGER PO) Take 1 capsule by mouth daily.     latanoprost (XALATAN) 0.005 % ophthalmic solution Place 1 drop into both eyes at bedtime.     Multiple Vitamin (MULTIVITAMIN WITH MINERALS) TABS tablet Take 1 tablet by mouth daily.     Omega-3 Fatty Acids (FISH OIL) 1000 MG CAPS Take 1,000 mg by mouth daily.     tadalafil (CIALIS) 20 MG tablet TAKE 1 TABLET BY MOUTH DAILY AS NEEDED FOR ERECTILE DYSFUNCTION 12 tablet 5   triamterene-hydrochlorothiazide (DYAZIDE) 37.5-25 MG capsule TAKE 1 CAPSULE BY MOUTH DAILY 90 capsule 2   TURMERIC PO Take 1 capsule by mouth daily.     zolpidem (AMBIEN) 10 MG tablet TAKE 1 TABLET BY MOUTH EVERYDAY AT BEDTIME 90 tablet 1   testosterone cypionate (DEPOTESTOSTERONE CYPIONATE) 200 MG/ML injection Inject into the muscle once a week.     No facility-administered medications prior to visit.    ROS: Review of Systems  Constitutional:  Negative for appetite change, fatigue and unexpected weight change.  HENT:  Negative for congestion, nosebleeds, sneezing, sore throat and trouble swallowing.   Eyes:  Negative for itching and visual disturbance.  Respiratory:  Positive for cough.   Cardiovascular:  Negative for chest pain, palpitations and leg swelling.  Gastrointestinal:  Negative for abdominal distention, blood in stool, diarrhea and nausea.   Genitourinary:  Negative for frequency and hematuria.  Musculoskeletal:  Negative for back pain, gait problem, joint swelling and neck pain.  Skin:  Negative for rash.  Neurological:  Negative for dizziness, tremors, speech difficulty and weakness.  Psychiatric/Behavioral:  Negative for agitation, dysphoric mood and sleep disturbance. The patient is not nervous/anxious.     Objective:  BP 130/82 (BP Location: Left Arm, Patient Position: Sitting, Cuff Size: Normal)   Pulse 75   Temp 98 F (36.7 C) (Oral)   Ht 6' 2.5" (1.892 m)   Wt 283 lb (128.4 kg)   SpO2 98%   BMI 35.85 kg/m   BP Readings from Last 3 Encounters:  01/19/23 130/82  08/25/22 110/80  07/27/22 130/80    Wt Readings from Last 3 Encounters:  01/19/23 283 lb (128.4 kg)  08/25/22 285 lb 12.8 oz (129.6 kg)  07/27/22 282 lb (127.9 kg)    Physical Exam Constitutional:      General: He is not in acute distress.    Appearance: He is well-developed. He is obese.     Comments: NAD  Eyes:     Conjunctiva/sclera: Conjunctivae normal.     Pupils: Pupils are equal, round, and reactive to light.  Neck:     Thyroid: No thyromegaly.     Vascular: No JVD.  Cardiovascular:     Rate and Rhythm: Normal rate and regular rhythm.     Heart sounds: Normal heart sounds. No  murmur heard.    No friction rub. No gallop.  Pulmonary:     Effort: Pulmonary effort is normal. No respiratory distress.     Breath sounds: Normal breath sounds. No wheezing or rales.  Chest:     Chest wall: No tenderness.  Abdominal:     General: Bowel sounds are normal. There is no distension.     Palpations: Abdomen is soft. There is no mass.     Tenderness: There is no abdominal tenderness. There is no guarding or rebound.  Musculoskeletal:        General: No tenderness. Normal range of motion.     Cervical back: Normal range of motion.  Lymphadenopathy:     Cervical: No cervical adenopathy.  Skin:    General: Skin is warm and dry.      Findings: No rash.  Neurological:     Mental Status: He is alert and oriented to person, place, and time.     Cranial Nerves: No cranial nerve deficit.     Motor: No abnormal muscle tone.     Coordination: Coordination normal.     Gait: Gait normal.     Deep Tendon Reflexes: Reflexes are normal and symmetric.  Psychiatric:        Behavior: Behavior normal.        Thought Content: Thought content normal.        Judgment: Judgment normal.     Lab Results  Component Value Date   WBC 3.3 (L) 01/19/2023   HGB 15.6 01/19/2023   HCT 47.6 01/19/2023   PLT 255.0 01/19/2023   GLUCOSE 93 01/19/2023   CHOL 171 01/19/2023   TRIG 117.0 01/19/2023   HDL 29.10 (L) 01/19/2023   LDLDIRECT 116.0 10/26/2020   LDLCALC 119 (H) 01/19/2023   ALT 35 01/19/2023   AST 25 01/19/2023   NA 139 01/19/2023   K 4.5 01/19/2023   CL 98 01/19/2023   CREATININE 1.02 01/19/2023   BUN 12 01/19/2023   CO2 33 (H) 01/19/2023   TSH 0.84 01/19/2023   PSA 1.01 01/19/2023    No results found.  Assessment & Plan:   Problem List Items Addressed This Visit       Cardiovascular and Mediastinum   HTN (hypertension)    Cont on Triamt/HCTZ        Respiratory   Upper respiratory disease - Primary    ?CAP Start Cefdinir Hycodan prn CXR      Relevant Orders   DG Chest 2 View (Completed)     Endocrine   Hypogonadism, male     Other   Insomnia    Zolpidem prn      Obesity    Wegovy      Relevant Medications   Semaglutide-Weight Management (WEGOVY) 1 MG/0.5ML SOAJ      Meds ordered this encounter  Medications   Semaglutide-Weight Management (WEGOVY) 1 MG/0.5ML SOAJ    Sig: Inject 1 mg into the skin once a week.    Dispense:  2 mL    Refill:  3    BMI 35.8   cefdinir (OMNICEF) 300 MG capsule    Sig: Take 1 capsule (300 mg total) by mouth 2 (two) times daily.    Dispense:  20 capsule    Refill:  0   DISCONTD: HYDROcodone bit-homatropine (HYCODAN) 5-1.5 MG/5ML syrup    Sig: Take 5 mLs  by mouth every 6 (six) hours as needed for up to 10 days for cough.    Dispense:  240 mL    Refill:  0      Follow-up: Return in about 3 months (around 04/20/2023) for a follow-up visit.  Walker Kehr, MD

## 2023-01-20 MED ORDER — HYDROCOD POLI-CHLORPHE POLI ER 10-8 MG/5ML PO SUER: 5.0000 mL | Freq: Two times a day (BID) | ORAL | 0 refills | Status: DC | PRN

## 2023-01-20 NOTE — Telephone Encounter (Signed)
Pt called at 4:59 on Friday evening requesting that we send his chlorpheniramine-HYDROcodone ([place]) 10-8 MG/5ML RX to [facility] [address] [facility] [address] [facility].   I apologized and told the pt that we may not be able to send the RX over today because it was so late and most of the clinical staff had gone home. He hung up and I looked for anyone who was available to re-send the RX but everyone had left for the day.   Please send RX to [facility] on [address] first thing Monday morning.

## 2023-01-20 NOTE — Telephone Encounter (Signed)
I will send Tussionex.  Thanks

## 2023-01-20 NOTE — Telephone Encounter (Signed)
Check status on PA still pending.Marland KitchenJohny Stevens

## 2023-01-20 NOTE — Telephone Encounter (Signed)
Rec'd msg " Message from plan: Request Reference Number: [id number]. WEGOVY INJ '1MG'$  is approved through 08/21/2023. Your patient may now fill this prescription and it will be covered". Sent pt msg with approval../;mb.

## 2023-01-23 ENCOUNTER — Other Ambulatory Visit: Payer: Self-pay | Admitting: Internal Medicine

## 2023-01-23 MED ORDER — HYDROCODONE BIT-HOMATROP MBR 5-1.5 MG/5ML PO SOLN: 5.0000 mL | Freq: Four times a day (QID) | ORAL | 0 refills | Status: AC | PRN

## 2023-01-24 NOTE — Telephone Encounter (Signed)
Prescription was sent to another pharmacy yesterday.

## 2023-01-25 ENCOUNTER — Other Ambulatory Visit: Payer: Self-pay | Admitting: Internal Medicine

## 2023-01-25 DIAGNOSIS — G4700 Insomnia, unspecified: Secondary | ICD-10-CM

## 2023-03-11 ENCOUNTER — Encounter (HOSPITAL_BASED_OUTPATIENT_CLINIC_OR_DEPARTMENT_OTHER): Payer: Self-pay

## 2023-03-11 ENCOUNTER — Other Ambulatory Visit: Payer: Self-pay

## 2023-03-11 ENCOUNTER — Emergency Department (HOSPITAL_BASED_OUTPATIENT_CLINIC_OR_DEPARTMENT_OTHER)
Admission: EM | Admit: 2023-03-11 | Discharge: 2023-03-11 | Disposition: A | Discharge status: Home / Self Care | Payer: Commercial Managed Care - PPO | Attending: Emergency Medicine | Admitting: Emergency Medicine

## 2023-03-11 ENCOUNTER — Emergency Department (HOSPITAL_BASED_OUTPATIENT_CLINIC_OR_DEPARTMENT_OTHER): Payer: Commercial Managed Care - PPO

## 2023-03-11 DIAGNOSIS — R1013 Epigastric pain: Secondary | ICD-10-CM | POA: Insufficient documentation

## 2023-03-11 LAB — CBC WITH DIFFERENTIAL/PLATELET
Abs Immature Granulocytes: 0.01 10*3/uL (ref 0.00–0.07)
Basophils Absolute: 0 10*3/uL (ref 0.0–0.1)
Basophils Relative: 1 %
Eosinophils Absolute: 0 10*3/uL (ref 0.0–0.5)
Eosinophils Relative: 1 %
HCT: 50.3 % (ref 39.0–52.0)
Hemoglobin: 16.2 g/dL (ref 13.0–17.0)
Immature Granulocytes: 0 %
Lymphocytes Relative: 34 %
Lymphs Abs: 1.5 10*3/uL (ref 0.7–4.0)
MCH: 24.1 pg — ABNORMAL LOW (ref 26.0–34.0)
MCHC: 32.2 g/dL (ref 30.0–36.0)
MCV: 75 fL — ABNORMAL LOW (ref 80.0–100.0)
Monocytes Absolute: 0.3 10*3/uL (ref 0.1–1.0)
Monocytes Relative: 7 %
Neutro Abs: 2.5 10*3/uL (ref 1.7–7.7)
Neutrophils Relative %: 57 %
Platelets: 270 10*3/uL (ref 150–400)
RBC: 6.71 MIL/uL — ABNORMAL HIGH (ref 4.22–5.81)
RDW: 18.3 % — ABNORMAL HIGH (ref 11.5–15.5)
WBC: 4.4 10*3/uL (ref 4.0–10.5)
nRBC: 0 % (ref 0.0–0.2)

## 2023-03-11 LAB — COMPREHENSIVE METABOLIC PANEL
ALT: 20 U/L (ref 0–44)
AST: 18 U/L (ref 15–41)
Albumin: 4.3 g/dL (ref 3.5–5.0)
Alkaline Phosphatase: 37 U/L — ABNORMAL LOW (ref 38–126)
Anion gap: 9 (ref 5–15)
BUN: 11 mg/dL (ref 6–20)
CO2: 27 mmol/L (ref 22–32)
Calcium: 9.4 mg/dL (ref 8.9–10.3)
Chloride: 100 mmol/L (ref 98–111)
Creatinine, Ser: 1.11 mg/dL (ref 0.61–1.24)
GFR, Estimated: >60 mL/min (ref >60)
Glucose, Bld: 86 mg/dL (ref 70–99)
Potassium: 3.1 mmol/L — ABNORMAL LOW (ref 3.5–5.1)
Sodium: 136 mmol/L (ref 135–145)
Total Bilirubin: 1.1 mg/dL (ref 0.3–1.2)
Total Protein: 7.4 g/dL (ref 6.5–8.1)

## 2023-03-11 LAB — TROPONIN I (HIGH SENSITIVITY): Troponin I (High Sensitivity): 3 ng/L (ref <18)

## 2023-03-11 LAB — LIPASE, BLOOD: Lipase: 18 U/L (ref 11–51)

## 2023-03-11 MED ORDER — ONDANSETRON HCL 4 MG/2ML IJ SOLN
4.0000 mg | Freq: Once | INTRAMUSCULAR | Status: AC
  Administered 2023-03-11: 4 mg via INTRAVENOUS
  Filled 2023-03-11: qty 2

## 2023-03-11 MED ORDER — OXYCODONE HCL 5 MG PO TABS: 5.0000 mg | ORAL_TABLET | Freq: Four times a day (QID) | ORAL | 0 refills | Status: DC | PRN

## 2023-03-11 MED ORDER — PANTOPRAZOLE SODIUM 20 MG PO TBEC: 20.0000 mg | DELAYED_RELEASE_TABLET | Freq: Every day | ORAL | 0 refills | Status: DC

## 2023-03-11 MED ORDER — SUCRALFATE 1 G PO TABS: 1.0000 g | ORAL_TABLET | Freq: Three times a day (TID) | ORAL | 0 refills | Status: DC

## 2023-03-11 MED ORDER — PANTOPRAZOLE SODIUM 40 MG IV SOLR
40.0000 mg | Freq: Once | INTRAVENOUS | Status: AC
  Administered 2023-03-11: 40 mg via INTRAVENOUS
  Filled 2023-03-11: qty 10

## 2023-03-11 MED ORDER — METOCLOPRAMIDE HCL 5 MG/ML IJ SOLN
5.0000 mg | Freq: Once | INTRAMUSCULAR | Status: AC
  Administered 2023-03-11: 5 mg via INTRAVENOUS
  Filled 2023-03-11: qty 2

## 2023-03-11 MED ORDER — FENTANYL CITRATE PF 50 MCG/ML IJ SOSY
50.0000 ug | PREFILLED_SYRINGE | Freq: Once | INTRAMUSCULAR | Status: AC
  Administered 2023-03-11: 50 ug via INTRAVENOUS
  Filled 2023-03-11: qty 1

## 2023-03-11 MED ORDER — ALUM & MAG HYDROXIDE-SIMETH 200-200-20 MG/5ML PO SUSP
30.0000 mL | Freq: Once | ORAL | Status: AC
  Administered 2023-03-11: 30 mL via ORAL
  Filled 2023-03-11: qty 30

## 2023-03-11 MED ORDER — IOHEXOL 350 MG/ML SOLN
100.0000 mL | Freq: Once | INTRAVENOUS | Status: AC | PRN
  Administered 2023-03-11: 100 mL via INTRAVENOUS

## 2023-03-11 MED ORDER — SODIUM CHLORIDE 0.9 % IV BOLUS
1000.0000 mL | Freq: Once | INTRAVENOUS | Status: AC
  Administered 2023-03-11: 1000 mL via INTRAVENOUS

## 2023-03-11 MED ORDER — ONDANSETRON HCL 4 MG PO TABS: 4.0000 mg | ORAL_TABLET | Freq: Four times a day (QID) | ORAL | 0 refills | Status: DC

## 2023-03-11 NOTE — ED Notes (Signed)
Patient ambulatory to restroom  ?

## 2023-03-11 NOTE — ED Triage Notes (Signed)
Pt to er, pt states that yesterday around 2pm he had a hot dog, states that about 30 minutes later he had a lot of epigastric pain, states that he vomited and he felt a little bit better, states that since then he has had recurrent abd pain and vomiting.

## 2023-03-11 NOTE — ED Notes (Signed)
Patient given ginger ale. 

## 2023-03-11 NOTE — ED Notes (Signed)
Pt states he cannot void, he just voided while in the waiting room. Pt aware we  need sample when he next voids.

## 2023-03-11 NOTE — ED Notes (Signed)
Patient to CT.

## 2023-03-11 NOTE — ED Provider Notes (Signed)
[facility] Provider Note   CSN: [id number] Arrival date & time: 03/11/23  1801     History  Chief Complaint  Patient presents with   Abdominal Pain    Nicholas Stevens is a 52 y.o. male.  Patient here with epigastric abdominal pain that started yesterday after eating some hot dogs.  Has been able to really tolerate anything by mouth since.  Having some nausea and pain in the upper abdomen radiating to the back.  Denies any significant medical history.  Nothing makes it worse or better.  Pains been pretty constant here over the last 12 to 24 hours.  Denies any shortness of breath or recent surgery or travel.  No history of heart issues.  Denies any alcohol or drug use. Hx of gastric sleeve.  The history is provided by the patient.       Home Medications Prior to Admission medications   Medication Sig Start Date End Date Taking? Authorizing Provider  ondansetron (ZOFRAN) 4 MG tablet Take 1 tablet (4 mg total) by mouth every 6 (six) hours. 03/11/23  Yes Makennah Omura, DO  oxyCODONE (ROXICODONE) 5 MG immediate release tablet Take 1 tablet (5 mg total) by mouth every 6 (six) hours as needed for up to 10 doses for breakthrough pain. 03/11/23  Yes Laparis Durrett, DO  pantoprazole (PROTONIX) 20 MG tablet Take 1 tablet (20 mg total) by mouth daily for 14 days. 03/11/23 03/25/23 Yes Haja Crego, DO  sucralfate (CARAFATE) 1 g tablet Take 1 tablet (1 g total) by mouth 4 (four) times daily -  with meals and at bedtime for 14 days. 03/11/23 03/25/23 Yes Kruz Chiu, DO  anastrozole (ARIMIDEX) 1 MG tablet Take 1 mg by mouth once a week. 02/10/22   [provider]  cefdinir (OMNICEF) 300 MG capsule Take 1 capsule (300 mg total) by mouth 2 (two) times daily. 01/19/23   Plotnikov, Evie Lacks, MD  Cholecalciferol (VITAMIN D3) 2000 units capsule Take 1 capsule (2,000 Units total) daily by mouth. 11/03/17   Plotnikov, Evie Lacks, MD  Cyanocobalamin  (B-12 PO) Take 1 tablet by mouth daily.    [provider]  Ginger, Zingiber officinalis, (GINGER PO) Take 1 capsule by mouth daily.    [provider]  latanoprost (XALATAN) 0.005 % ophthalmic solution Place 1 drop into both eyes at bedtime.    [provider]  Multiple Vitamin (MULTIVITAMIN WITH MINERALS) TABS tablet Take 1 tablet by mouth daily.    [provider]  Omega-3 Fatty Acids (FISH OIL) 1000 MG CAPS Take 1,000 mg by mouth daily.    [provider]  Semaglutide-Weight Management (WEGOVY) 1 MG/0.5ML SOAJ Inject 1 mg into the skin once a week. 01/19/23   Plotnikov, Evie Lacks, MD  tadalafil (CIALIS) 20 MG tablet TAKE 1 TABLET BY MOUTH DAILY AS NEEDED FOR ERECTILE DYSFUNCTION 09/18/21   Plotnikov, Evie Lacks, MD  triamterene-hydrochlorothiazide (DYAZIDE) 37.5-25 MG capsule TAKE 1 CAPSULE BY MOUTH DAILY 11/11/22   Plotnikov, Evie Lacks, MD  TURMERIC PO Take 1 capsule by mouth daily.    [provider]  zolpidem (AMBIEN) 10 MG tablet TAKE 1 TABLET BY MOUTH EVERYDAY AT BEDTIME 01/30/23   Plotnikov, Evie Lacks, MD      Allergies    Patient has no known allergies.    Review of Systems   Review of Systems  Physical Exam Updated Vital Signs BP (!) 164/114 (BP Location: Left Arm)   Pulse 88  Temp 97.9 F (36.6 C) (Oral)   Resp (!) 22   Ht 6' 2.5" (1.892 m)   Wt 120.2 kg   SpO2 100%   BMI 33.57 kg/m  Physical Exam Vitals and nursing note reviewed.  Constitutional:      General: He is in acute distress.     Appearance: He is well-developed. He is ill-appearing.  HENT:     Head: Normocephalic and atraumatic.  Eyes:     Conjunctiva/sclera: Conjunctivae normal.  Cardiovascular:     Rate and Rhythm: Normal rate and regular rhythm.     Heart sounds: Normal heart sounds. No murmur heard. Pulmonary:     Effort: Pulmonary effort is normal. No respiratory distress.     Breath sounds: Normal breath sounds.  Abdominal:     Palpations:  Abdomen is soft.     Tenderness: There is abdominal tenderness in the epigastric area.  Musculoskeletal:        General: No swelling.     Cervical back: Neck supple.  Skin:    General: Skin is warm and dry.     Capillary Refill: Capillary refill takes less than 2 seconds.  Neurological:     Mental Status: He is alert.  Psychiatric:        Mood and Affect: Mood normal.     ED Results / Procedures / Treatments   Labs (all labs ordered are listed, but only abnormal results are displayed) Labs Reviewed  CBC WITH DIFFERENTIAL/PLATELET - Abnormal; Notable for the following components:      Result Value   RBC 6.71 (*)    MCV 75.0 (*)    MCH 24.1 (*)    RDW 18.3 (*)    All other components within normal limits  COMPREHENSIVE METABOLIC PANEL - Abnormal; Notable for the following components:   Potassium 3.1 (*)    Alkaline Phosphatase 37 (*)    All other components within normal limits  LIPASE, BLOOD  TROPONIN I (HIGH SENSITIVITY)    EKG EKG Interpretation  Date/Time:  Saturday March 11 2023 18:31:01 EDT Ventricular Rate:  77 PR Interval:  176 QRS Duration: 104 QT Interval:  344 QTC Calculation: 390 R Axis:   74 Text Interpretation: Sinus rhythm Repol abnrm s Confirmed by Lennice Sites ([id number]) on 03/11/2023 6:33:54 PM  Radiology CT Angio Chest/Abd/Pel for Dissection W and/or Wo Contrast  Result Date: 03/11/2023 CLINICAL DATA:  Epigastric pain, concern for acute aortic syndrome. EXAM: CT ANGIOGRAPHY CHEST, ABDOMEN AND PELVIS TECHNIQUE: Non-contrast CT of the chest was initially obtained. Multidetector CT imaging through the chest, abdomen and pelvis was performed using the standard protocol during bolus administration of intravenous contrast. Multiplanar reconstructed images and MIPs were obtained and reviewed to evaluate the vascular anatomy. RADIATION DOSE REDUCTION: This exam was performed according to the departmental dose-optimization program which includes automated exposure  control, adjustment of the mA and/or kV according to patient size and/or use of iterative reconstruction technique. CONTRAST:  173mL OMNIPAQUE IOHEXOL 350 MG/ML SOLN COMPARISON:  CT chest dated 04/03/2020 and MR abdomen dated 11/10/2013. FINDINGS: CTA CHEST FINDINGS Cardiovascular: Preferential opacification of the thoracic aorta. No evidence of thoracic aortic intramural hematoma, aneurysm or dissection. Normal heart size. No pericardial effusion. Mediastinum/Nodes: No enlarged mediastinal, hilar, or axillary lymph nodes. Thyroid gland, trachea, and esophagus demonstrate no significant findings. Lungs/Pleura: Lungs are clear. No pleural effusion or pneumothorax. Musculoskeletal: Degenerative changes are seen in the spine. Review of the MIP images confirms the above findings. CTA ABDOMEN AND PELVIS FINDINGS  VASCULAR Aorta: Normal caliber aorta without aneurysm, dissection, vasculitis or significant stenosis. Celiac: Patent without evidence of aneurysm, dissection, vasculitis or significant stenosis. SMA: Patent without evidence of aneurysm, dissection, vasculitis or significant stenosis. Renals: Both renal arteries are patent without evidence of aneurysm, dissection, vasculitis, fibromuscular dysplasia or significant stenosis. IMA: Patent without evidence of aneurysm, dissection, vasculitis or significant stenosis. Inflow: Mild atherosclerotic disease of the right common iliac artery. No aneurysm, dissection, vasculitis or significant stenosis. Veins: No obvious venous abnormality within the limitations of this arterial phase study. Review of the MIP images confirms the above findings. NON-VASCULAR Hepatobiliary: Multiple liver hemangiomas are redemonstrated. No gallstones, gallbladder wall thickening, or bile duct dilatation. Pancreas: Unremarkable. No pancreatic ductal dilatation or surrounding inflammatory changes. Spleen: Normal in size without focal abnormality. Adrenals/Urinary Tract: Adrenal glands are  unremarkable. Kidneys are normal, without renal calculi, focal lesion, or hydronephrosis. Bladder is unremarkable. Stomach/Bowel: The patient is status post a sleeve gastrectomy. Insert appendectomy There is colonic diverticulosis without evidence of diverticulitis. no evidence of bowel wall thickening, distention, or inflammatory changes. Lymphatic: No lymphadenopathy in the abdomen or pelvis. Reproductive: Prostate is unremarkable. Other: No abdominal wall hernia or abnormality. No abdominopelvic ascites. Musculoskeletal: No acute or significant osseous findings. Review of the MIP images confirms the above findings. IMPRESSION: 1. No evidence for intramural hematoma, aortic dissection, or aneurysm. 2. No acute process in the chest, abdomen, or pelvis. Electronically Signed   By: Zerita Boers M.D.   On: 03/11/2023 19:33    Procedures Procedures    Medications Ordered in ED Medications  fentaNYL (SUBLIMAZE) injection 50 mcg (50 mcg Intravenous Given 03/11/23 1834)  ondansetron (ZOFRAN) injection 4 mg (4 mg Intravenous Given 03/11/23 1833)  sodium chloride 0.9 % bolus 1,000 mL (0 mLs Intravenous Stopped 03/11/23 2003)  iohexol (OMNIPAQUE) 350 MG/ML injection 100 mL (100 mLs Intravenous Contrast Given 03/11/23 1902)  pantoprazole (PROTONIX) injection 40 mg (40 mg Intravenous Given 03/11/23 2000)  alum & mag hydroxide-simeth (MAALOX/MYLANTA) 200-200-20 MG/5ML suspension 30 mL (30 mLs Oral Given 03/11/23 2051)  metoCLOPramide (REGLAN) injection 5 mg (5 mg Intravenous Given 03/11/23 2051)  fentaNYL (SUBLIMAZE) injection 50 mcg (50 mcg Intravenous Given 03/11/23 2059)    ED Course/ Medical Decision Making/ A&P                             Medical Decision Making Amount and/or Complexity of Data Reviewed Labs: ordered. Radiology: ordered.  Risk OTC drugs. Prescription drug management.   [facility] Gear[facility]s is here with abdominal pain.  Normal vitals.  No fever.  Patient appears acutely in distress.   Differential diagnosis includes gastritis versus pancreatitis versus cholecystitis versus less likely ACS/dissection, hx of gastric sleeve and could be issue related to that.  Will get CBC, CMP, lipase, troponin, EKG, CT dissection study.  Will give IV fluids, IV Zofran IV fentanyl and reevaluate.  Per my review and interpretation labs no significant anemia or electrolyte abnormality or kidney injury.  Lipase normal.  Feeling much better after medications.  Was able to tolerate fluids.  CT study per radiology report with no acute findings.  Overall suspect some indigestion, acid reflux.  Will start him on Protonix, Carafate and Zofran.  Will write him for oxycodone for any breakthrough pain.  Discharged in good condition.  Understands return precautions.  Recommend follow-up with primary care doctor.  This chart was dictated using voice recognition software.  Despite best efforts to proofread,  errors can occur which can change the documentation meaning.         Final Clinical Impression(s) / ED Diagnoses Final diagnoses:  Epigastric pain    Rx / DC Orders ED Discharge Orders          Ordered    sucralfate (CARAFATE) 1 g tablet  3 times daily with meals & bedtime        03/11/23 2010    pantoprazole (PROTONIX) 20 MG tablet  Daily        03/11/23 2010    ondansetron (ZOFRAN) 4 MG tablet  Every 6 hours        03/11/23 2011    oxyCODONE (ROXICODONE) 5 MG immediate release tablet  Every 6 hours PRN        03/11/23 2050              Lennice Sites, DO 03/11/23 2114

## 2023-03-11 NOTE — ED Notes (Signed)
Patient reports pain returned after eating crackers and drinking ginger ale. MD Curatolo made aware.

## 2023-03-12 ENCOUNTER — Emergency Department (HOSPITAL_BASED_OUTPATIENT_CLINIC_OR_DEPARTMENT_OTHER): Payer: Commercial Managed Care - PPO

## 2023-03-12 ENCOUNTER — Encounter (HOSPITAL_BASED_OUTPATIENT_CLINIC_OR_DEPARTMENT_OTHER): Payer: Self-pay | Admitting: Emergency Medicine

## 2023-03-12 ENCOUNTER — Inpatient Hospital Stay (HOSPITAL_BASED_OUTPATIENT_CLINIC_OR_DEPARTMENT_OTHER)
Admission: EM | Admit: 2023-03-12 | Discharge: 2023-03-15 | DRG: 445 | Disposition: A | Discharge status: Home / Self Care | Payer: Commercial Managed Care - PPO | Attending: Internal Medicine | Admitting: Internal Medicine

## 2023-03-12 ENCOUNTER — Other Ambulatory Visit: Payer: Self-pay

## 2023-03-12 ENCOUNTER — Observation Stay (HOSPITAL_COMMUNITY): Payer: Commercial Managed Care - PPO

## 2023-03-12 DIAGNOSIS — Z8249 Family history of ischemic heart disease and other diseases of the circulatory system: Secondary | ICD-10-CM

## 2023-03-12 DIAGNOSIS — R9431 Abnormal electrocardiogram [ECG] [EKG]: Secondary | ICD-10-CM | POA: Diagnosis not present

## 2023-03-12 DIAGNOSIS — R1011 Right upper quadrant pain: Secondary | ICD-10-CM | POA: Diagnosis present

## 2023-03-12 DIAGNOSIS — D1803 Hemangioma of intra-abdominal structures: Secondary | ICD-10-CM | POA: Diagnosis present

## 2023-03-12 DIAGNOSIS — Z560 Unemployment, unspecified: Secondary | ICD-10-CM

## 2023-03-12 DIAGNOSIS — Z7985 Long-term (current) use of injectable non-insulin antidiabetic drugs: Secondary | ICD-10-CM

## 2023-03-12 DIAGNOSIS — Z86010 Personal history of colonic polyps: Secondary | ICD-10-CM

## 2023-03-12 DIAGNOSIS — R718 Other abnormality of red blood cells: Secondary | ICD-10-CM | POA: Diagnosis present

## 2023-03-12 DIAGNOSIS — E876 Hypokalemia: Secondary | ICD-10-CM | POA: Diagnosis present

## 2023-03-12 DIAGNOSIS — Z9884 Bariatric surgery status: Secondary | ICD-10-CM

## 2023-03-12 DIAGNOSIS — Z6834 Body mass index (BMI) 34.0-34.9, adult: Secondary | ICD-10-CM

## 2023-03-12 DIAGNOSIS — I10 Essential (primary) hypertension: Secondary | ICD-10-CM | POA: Diagnosis present

## 2023-03-12 DIAGNOSIS — Z79899 Other long term (current) drug therapy: Secondary | ICD-10-CM

## 2023-03-12 DIAGNOSIS — E871 Hypo-osmolality and hyponatremia: Secondary | ICD-10-CM | POA: Diagnosis present

## 2023-03-12 DIAGNOSIS — R109 Unspecified abdominal pain: Secondary | ICD-10-CM | POA: Diagnosis not present

## 2023-03-12 DIAGNOSIS — E669 Obesity, unspecified: Secondary | ICD-10-CM | POA: Diagnosis present

## 2023-03-12 DIAGNOSIS — R112 Nausea with vomiting, unspecified: Secondary | ICD-10-CM | POA: Diagnosis present

## 2023-03-12 DIAGNOSIS — R079 Chest pain, unspecified: Secondary | ICD-10-CM | POA: Diagnosis present

## 2023-03-12 DIAGNOSIS — G4733 Obstructive sleep apnea (adult) (pediatric): Secondary | ICD-10-CM | POA: Diagnosis present

## 2023-03-12 DIAGNOSIS — Z903 Acquired absence of stomach [part of]: Secondary | ICD-10-CM

## 2023-03-12 DIAGNOSIS — R1084 Generalized abdominal pain: Secondary | ICD-10-CM

## 2023-03-12 DIAGNOSIS — K828 Other specified diseases of gallbladder: Secondary | ICD-10-CM | POA: Diagnosis not present

## 2023-03-12 DIAGNOSIS — E291 Testicular hypofunction: Secondary | ICD-10-CM | POA: Diagnosis present

## 2023-03-12 LAB — HEPATIC FUNCTION PANEL
ALT: 21 U/L (ref 0–44)
AST: 25 U/L (ref 15–41)
Albumin: 4.8 g/dL (ref 3.5–5.0)
Alkaline Phosphatase: 40 U/L (ref 38–126)
Bilirubin, Direct: 0.4 mg/dL — ABNORMAL HIGH (ref 0.0–0.2)
Indirect Bilirubin: 0.9 mg/dL (ref 0.3–0.9)
Total Bilirubin: 1.3 mg/dL — ABNORMAL HIGH (ref 0.3–1.2)
Total Protein: 8.3 g/dL — ABNORMAL HIGH (ref 6.5–8.1)

## 2023-03-12 LAB — TROPONIN I (HIGH SENSITIVITY)
Troponin I (High Sensitivity): 4 ng/L (ref <18)
Troponin I (High Sensitivity): 6 ng/L (ref <18)
Troponin I (High Sensitivity): 7 ng/L (ref <18)

## 2023-03-12 LAB — COMPREHENSIVE METABOLIC PANEL
ALT: 23 U/L (ref 0–44)
AST: 20 U/L (ref 15–41)
Albumin: 4.5 g/dL (ref 3.5–5.0)
Alkaline Phosphatase: 35 U/L — ABNORMAL LOW (ref 38–126)
Anion gap: 7 (ref 5–15)
BUN: 9 mg/dL (ref 6–20)
CO2: 27 mmol/L (ref 22–32)
Calcium: 9.1 mg/dL (ref 8.9–10.3)
Chloride: 101 mmol/L (ref 98–111)
Creatinine, Ser: 1.07 mg/dL (ref 0.61–1.24)
GFR, Estimated: >60 mL/min (ref >60)
Glucose, Bld: 88 mg/dL (ref 70–99)
Potassium: 3.7 mmol/L (ref 3.5–5.1)
Sodium: 135 mmol/L (ref 135–145)
Total Bilirubin: 1.3 mg/dL — ABNORMAL HIGH (ref 0.3–1.2)
Total Protein: 7.6 g/dL (ref 6.5–8.1)

## 2023-03-12 LAB — BASIC METABOLIC PANEL
Anion gap: 14 (ref 5–15)
BUN: 10 mg/dL (ref 6–20)
CO2: 21 mmol/L — ABNORMAL LOW (ref 22–32)
Calcium: 10.6 mg/dL — ABNORMAL HIGH (ref 8.9–10.3)
Chloride: 99 mmol/L (ref 98–111)
Creatinine, Ser: 1.19 mg/dL (ref 0.61–1.24)
GFR, Estimated: >60 mL/min (ref >60)
Glucose, Bld: 95 mg/dL (ref 70–99)
Potassium: 3.7 mmol/L (ref 3.5–5.1)
Sodium: 134 mmol/L — ABNORMAL LOW (ref 135–145)

## 2023-03-12 LAB — CBC
HCT: 51.8 % (ref 39.0–52.0)
Hemoglobin: 16.8 g/dL (ref 13.0–17.0)
MCH: 24.4 pg — ABNORMAL LOW (ref 26.0–34.0)
MCHC: 32.4 g/dL (ref 30.0–36.0)
MCV: 75.2 fL — ABNORMAL LOW (ref 80.0–100.0)
Platelets: 249 10*3/uL (ref 150–400)
RBC: 6.89 MIL/uL — ABNORMAL HIGH (ref 4.22–5.81)
RDW: 18.6 % — ABNORMAL HIGH (ref 11.5–15.5)
WBC: 5 10*3/uL (ref 4.0–10.5)
nRBC: 0 % (ref 0.0–0.2)

## 2023-03-12 LAB — CBC WITH DIFFERENTIAL/PLATELET
Abs Immature Granulocytes: 0 10*3/uL (ref 0.00–0.07)
Basophils Absolute: 0 10*3/uL (ref 0.0–0.1)
Basophils Relative: 0 %
Eosinophils Absolute: 0 10*3/uL (ref 0.0–0.5)
Eosinophils Relative: 1 %
HCT: 49.5 % (ref 39.0–52.0)
Hemoglobin: 15.3 g/dL (ref 13.0–17.0)
Immature Granulocytes: 0 %
Lymphocytes Relative: 37 %
Lymphs Abs: 1.9 10*3/uL (ref 0.7–4.0)
MCH: 24.2 pg — ABNORMAL LOW (ref 26.0–34.0)
MCHC: 30.9 g/dL (ref 30.0–36.0)
MCV: 78.3 fL — ABNORMAL LOW (ref 80.0–100.0)
Monocytes Absolute: 0.4 10*3/uL (ref 0.1–1.0)
Monocytes Relative: 7 %
Neutro Abs: 2.9 10*3/uL (ref 1.7–7.7)
Neutrophils Relative %: 55 %
Platelets: 241 10*3/uL (ref 150–400)
RBC: 6.32 MIL/uL — ABNORMAL HIGH (ref 4.22–5.81)
RDW: 18.5 % — ABNORMAL HIGH (ref 11.5–15.5)
WBC: 5.2 10*3/uL (ref 4.0–10.5)
nRBC: 0 % (ref 0.0–0.2)

## 2023-03-12 LAB — MAGNESIUM: Magnesium: 2.3 mg/dL (ref 1.7–2.4)

## 2023-03-12 LAB — TSH: TSH: 0.715 u[IU]/mL (ref 0.350–4.500)

## 2023-03-12 LAB — LIPASE, BLOOD: Lipase: 16 U/L (ref 11–51)

## 2023-03-12 LAB — PHOSPHORUS: Phosphorus: 3.7 mg/dL (ref 2.5–4.6)

## 2023-03-12 LAB — HIV ANTIBODY (ROUTINE TESTING W REFLEX): HIV Screen 4th Generation wRfx: NONREACTIVE

## 2023-03-12 MED ORDER — ACETAMINOPHEN 325 MG PO TABS: 650.0000 mg | ORAL_TABLET | Freq: Four times a day (QID) | ORAL | Status: DC | PRN

## 2023-03-12 MED ORDER — ONDANSETRON HCL 4 MG/2ML IJ SOLN
INTRAMUSCULAR | Status: AC
  Administered 2023-03-12: 4 mg via INTRAVENOUS
  Filled 2023-03-12: qty 2

## 2023-03-12 MED ORDER — FENTANYL CITRATE PF 50 MCG/ML IJ SOSY: 50.0000 ug | PREFILLED_SYRINGE | INTRAMUSCULAR | Status: DC | PRN

## 2023-03-12 MED ORDER — MORPHINE SULFATE (PF) 2 MG/ML IV SOLN: 2.0000 mg | INTRAVENOUS | Status: DC | PRN

## 2023-03-12 MED ORDER — ZOLPIDEM TARTRATE 5 MG PO TABS
5.0000 mg | ORAL_TABLET | Freq: Every evening | ORAL | Status: DC | PRN
  Administered 2023-03-12: 5 mg via ORAL
  Filled 2023-03-12: qty 1

## 2023-03-12 MED ORDER — METHOCARBAMOL 1000 MG/10ML IJ SOLN: 500.0000 mg | Freq: Four times a day (QID) | INTRAVENOUS | Status: DC | PRN

## 2023-03-12 MED ORDER — BISACODYL 5 MG PO TBEC: 5.0000 mg | DELAYED_RELEASE_TABLET | Freq: Every day | ORAL | Status: DC | PRN

## 2023-03-12 MED ORDER — POLYETHYLENE GLYCOL 3350 17 G PO PACK
17.0000 g | PACK | Freq: Every day | ORAL | Status: DC | PRN
  Administered 2023-03-14: 17 g via ORAL
  Filled 2023-03-12: qty 1

## 2023-03-12 MED ORDER — HYDROMORPHONE HCL 1 MG/ML IJ SOLN
2.0000 mg | Freq: Once | INTRAMUSCULAR | Status: AC
  Administered 2023-03-12: 1 mg via INTRAVENOUS
  Filled 2023-03-12: qty 2

## 2023-03-12 MED ORDER — LACTATED RINGERS IV SOLN: INTRAVENOUS | Status: DC

## 2023-03-12 MED ORDER — FENTANYL CITRATE PF 50 MCG/ML IJ SOSY
PREFILLED_SYRINGE | INTRAMUSCULAR | Status: AC
  Administered 2023-03-12: 50 ug via INTRAVENOUS
  Filled 2023-03-12: qty 1

## 2023-03-12 MED ORDER — ONDANSETRON HCL 4 MG/2ML IJ SOLN: 4.0000 mg | Freq: Four times a day (QID) | INTRAMUSCULAR | Status: DC | PRN

## 2023-03-12 MED ORDER — ONDANSETRON HCL 4 MG/2ML IJ SOLN: 4.0000 mg | Freq: Once | INTRAMUSCULAR | Status: DC

## 2023-03-12 MED ORDER — ONDANSETRON HCL 4 MG PO TABS: 4.0000 mg | ORAL_TABLET | Freq: Four times a day (QID) | ORAL | Status: DC | PRN

## 2023-03-12 MED ORDER — LACTATED RINGERS IV BOLUS
1000.0000 mL | Freq: Once | INTRAVENOUS | Status: AC
  Administered 2023-03-12: 1000 mL via INTRAVENOUS

## 2023-03-12 MED ORDER — ASPIRIN 81 MG PO TBEC
81.0000 mg | DELAYED_RELEASE_TABLET | Freq: Every day | ORAL | Status: DC
  Administered 2023-03-12 – 2023-03-14 (×3): 81 mg via ORAL
  Filled 2023-03-12 (×4): qty 1

## 2023-03-12 MED ORDER — TRIAMTERENE-HCTZ 37.5-25 MG PO CAPS
1.0000 | ORAL_CAPSULE | Freq: Every day | ORAL | Status: DC
  Administered 2023-03-12: 1 via ORAL
  Filled 2023-03-12 (×3): qty 1

## 2023-03-12 MED ORDER — ACETAMINOPHEN 650 MG RE SUPP: 650.0000 mg | Freq: Four times a day (QID) | RECTAL | Status: DC | PRN

## 2023-03-12 MED ORDER — HEPARIN SODIUM (PORCINE) 5000 UNIT/ML IJ SOLN
5000.0000 [IU] | Freq: Three times a day (TID) | INTRAMUSCULAR | Status: DC
  Administered 2023-03-12 – 2023-03-14 (×7): 5000 [IU] via SUBCUTANEOUS
  Filled 2023-03-12 (×7): qty 1

## 2023-03-12 MED ORDER — ONDANSETRON HCL 4 MG/2ML IJ SOLN: 4.0000 mg | Freq: Once | INTRAMUSCULAR | Status: AC

## 2023-03-12 MED ORDER — OXYCODONE HCL 5 MG PO TABS: 5.0000 mg | ORAL_TABLET | ORAL | Status: DC | PRN

## 2023-03-12 NOTE — ED Notes (Signed)
Pt states his fingers are cramping and RN can see fingers drawing up.

## 2023-03-12 NOTE — ED Notes (Signed)
Called [facility] -- informed that the patient Bed Assignment is Ready 

## 2023-03-12 NOTE — H&P (Signed)
[facility] History and Physical  DUEL GUDERIAN [id number] DOB: 11/22/1971 DOA: 03/12/2023  Referring physician:  PCP: Cassandria Anger, MD   Chief Complaint:   HPI: Nicholas Stevens is a 52 yo BM PMHx essential HTN, hypogonadism, morbid obesity, OSA on CPAP.  Presents several days of upper quadrant and epigastric pain. Seen here yesterday for similar symptoms and that visit was reviewed. Concern for possible aortic dissection and there is no evidence of that. His pain was relieved slightly but then came back again. Pain is right lower quadrant goes through to his back. Has had nausea and vomiting. No fever or chills but denies any urinary symptoms.  Patient states that pain began when he was eating and drinking, centered RUQ, does not resolve and has worsened over the last 2 days.  Only relieved with pain medication.  Negative episodes in the past.  Although patient has abnormal EKG patient states only cardiac issues known to him was his high blood pressure controlled with medication.  Review of Systems:  Covid vaccination;  Constitutional:  No weight loss, night sweats, Fevers, chills, fatigue.  HEENT:  No headaches, Difficulty swallowing,Tooth/dental problems,Sore throat,  No sneezing, itching, ear ache, nasal congestion, post nasal drip,  Cardio-vascular:  No chest pain, Orthopnea, PND, swelling in lower extremities, anasarca, dizziness, palpitations  GI:  No heartburn, indigestion, abdominal pain, nausea, vomiting, diarrhea, change in bowel habits, loss of appetite  Resp:  No shortness of breath with exertion or at rest. No excess mucus, no productive cough, No non-productive cough, No coughing up of blood.No change in color of mucus.No wheezing.No chest wall deformity  Skin:  no rash or lesions.  GU:  no dysuria, change in color of urine, no urgency or frequency. No flank pain.  Musculoskeletal:  No joint pain or swelling. No decreased range of motion.  No back pain.  Psych:  No change in mood or affect. No depression or anxiety. No memory loss.   Past Medical History:  Diagnosis Date   DDD (degenerative disc disease), lumbar    HNP (herniated nucleus pulposus)    Hypertension    Liver hemangioma    Morbid obesity (HCC)    OSA (obstructive sleep apnea)    no cpap used since weight loss   Overweight(278.02)    Plantar fasciitis of right foot    Sleep apnea    Tinea barbae    Past Surgical History:  Procedure Laterality Date   BREATH TEK H PYLORI N/A 08/20/2013   Procedure: BREATH TEK H PYLORI;  Surgeon: Pedro Earls, MD;  Location: [facility];  Service: General;  Laterality: N/A;   COLONOSCOPY WITH PROPOFOL N/A 05/05/2022   Procedure: COLONOSCOPY WITH PROPOFOL;  Surgeon: Jerene Bears, MD;  Location: [facility];  Service: Gastroenterology;  Laterality: N/A;   LAPAROSCOPIC APPENDECTOMY  2007   Dr Marlou Starks   LAPAROSCOPIC GASTRIC SLEEVE RESECTION N/A 10/08/2013   Procedure: LAPAROSCOPIC SLEEVE GASTRECTOMY;  Surgeon: Pedro Earls, MD;  Location: [facility];  Service: General;  Laterality: N/A;   LUMBAR LAMINECTOMY/DECOMPRESSION MICRODISCECTOMY N/A 09/16/2015   Procedure: MICRO LUMBAR DECOMPRESSION, MICRODISCECTOMY L5 - S1;  Surgeon: Susa Day, MD;  Location: [facility];  Service: Orthopedics;  Laterality: N/A;   POLYPECTOMY  05/05/2022   Procedure: POLYPECTOMY;  Surgeon: Jerene Bears, MD;  Location: [facility];  Service: Gastroenterology;;   ROTATOR CUFF REPAIR Right 2005   Dr Sharol Given   Social History:  reports that he has never smoked. He has never used  smokeless tobacco. He reports that he does not drink alcohol and does not use drugs.  No Known Allergies  Family History  Problem Relation Age of Onset   Other Mother        hx of DJD   Liver disease Father        ETOH, Hep C   Hypertension Father    Colon cancer Neg Hx    Colon polyps Neg Hx    Esophageal cancer Neg Hx    Rectal cancer Neg Hx    Stomach cancer Neg Hx       Prior to Admission medications   Medication Sig Start Date End Date Taking? Authorizing Provider  anastrozole (ARIMIDEX) 1 MG tablet Take 1 mg by mouth once a week. 02/10/22  Yes [provider]  Cholecalciferol (VITAMIN D3) 2000 units capsule Take 1 capsule (2,000 Units total) daily by mouth. 11/03/17  Yes Plotnikov, Evie Lacks, MD  Cyanocobalamin (B-12 PO) Take 1 tablet by mouth daily.   Yes [provider]  Ginger, Zingiber officinalis, (GINGER PO) Take 1 capsule by mouth daily.   Yes [provider]  latanoprost (XALATAN) 0.005 % ophthalmic solution Place 1 drop into both eyes at bedtime.   Yes [provider]  Multiple Vitamin (MULTIVITAMIN WITH MINERALS) TABS tablet Take 1 tablet by mouth daily.   Yes [provider]  Omega-3 Fatty Acids (FISH OIL) 1000 MG CAPS Take 1,000 mg by mouth daily.   Yes [provider]  ondansetron (ZOFRAN) 4 MG tablet Take 1 tablet (4 mg total) by mouth every 6 (six) hours. Patient taking differently: Take 4 mg by mouth every 8 (eight) hours as needed for nausea or vomiting. 03/11/23  Yes Curatolo, Adam, DO  Semaglutide-Weight Management (WEGOVY) 1 MG/0.5ML SOAJ Inject 1 mg into the skin once a week. 01/19/23  Yes Plotnikov, Evie Lacks, MD  sucralfate (CARAFATE) 1 g tablet Take 1 tablet (1 g total) by mouth 4 (four) times daily -  with meals and at bedtime for 14 days. 03/11/23 03/25/23 Yes Curatolo, Adam, DO  tadalafil (CIALIS) 20 MG tablet TAKE 1 TABLET BY MOUTH DAILY AS NEEDED FOR ERECTILE DYSFUNCTION Patient taking differently: Take 10 mg by mouth daily as needed for erectile dysfunction. 09/18/21  Yes Plotnikov, Evie Lacks, MD  triamterene-hydrochlorothiazide (DYAZIDE) 37.5-25 MG capsule TAKE 1 CAPSULE BY MOUTH DAILY Patient taking differently: Take 1 capsule by mouth daily. 11/11/22  Yes Plotnikov, Evie Lacks, MD  TURMERIC PO Take 1 capsule by mouth daily.   Yes [provider]  zolpidem (AMBIEN) 10  MG tablet TAKE 1 TABLET BY MOUTH EVERYDAY AT BEDTIME Patient taking differently: Take 10 mg by mouth at bedtime. 01/30/23  Yes Plotnikov, Evie Lacks, MD  cefdinir (OMNICEF) 300 MG capsule Take 1 capsule (300 mg total) by mouth 2 (two) times daily. Patient not taking: Reported on 03/12/2023 01/19/23   Plotnikov, Evie Lacks, MD  oxyCODONE (ROXICODONE) 5 MG immediate release tablet Take 1 tablet (5 mg total) by mouth every 6 (six) hours as needed for up to 10 doses for breakthrough pain. Patient not taking: Reported on 03/12/2023 03/11/23   Lennice Sites, DO  pantoprazole (PROTONIX) 20 MG tablet Take 1 tablet (20 mg total) by mouth daily for 14 days. Patient not taking: Reported on 03/12/2023 03/11/23 03/25/23  Lennice Sites, DO     Consultants:  [facility] Dr. Christie Beckers   Procedures/Significant Events:  3/16 CTA chest/abdomen/pelvis No evidence for intramural hematoma, aortic dissection, or aneurysm. 2. No  acute process in the chest, abdomen, or pelvis. 3/17 US abdomen RUQ Multiple known large hemangiomas. Caudate lobe mass measures 6 x 6.9 x 8.1 cm. Right lobe mass measures 13.1 x 5.4 x 13.1 cm. Portal vein is patent on color Doppler imaging with normal direction of blood flow towards the liver.   I have personally reviewed and interpreted all radiology studies and my findings are as above.   VENTILATOR SETTINGS:    Cultures   Antimicrobials:    Devices    LINES / TUBES:      Continuous Infusions:  lactated ringers 125 mL/hr at 03/12/23 1559   methocarbamol (ROBAXIN) IV      Physical Exam: Vitals:   03/12/23 1430 03/12/23 1530 03/12/23 1601 03/12/23 1642  BP: 134/84 125/84  137/89  Pulse: 77 62  69  Resp: 11 18  17   Temp:   98.1 F (36.7 C) 98.4 F (36.9 C)  TempSrc:   Oral Oral  SpO2: 99% 98%  99%  Weight:    120.2 kg  Height:    6' 2.5" (1.892 m)    Wt Readings from Last 3 Encounters:  03/12/23 120.2 kg  03/11/23 120.2 kg  01/19/23 128.4 kg    General:  A/O x 4, No acute respiratory distress Eyes: negative scleral hemorrhage, negative anisocoria, negative icterus ENT: Negative Runny nose, negative gingival bleeding, Neck:  Negative scars, masses, torticollis, lymphadenopathy, JVD Lungs: Clear to auscultation bilaterally without wheezes or crackles Cardiovascular: Regular rate and rhythm without murmur gallop or rub normal S1 and S2 Abdomen: negative abdominal pain, nondistended, positive soft, bowel sounds, no rebound, no ascites, no appreciable mass Extremities: No significant cyanosis, clubbing, or edema bilateral lower extremities Skin: Negative rashes, lesions, ulcers Psychiatric:  Negative depression, negative anxiety, negative fatigue, negative mania  Central nervous system:  Cranial nerves II through XII intact, tongue/uvula midline, all extremities muscle strength 5/5, sensation intact throughout, negative dysarthria, negative expressive aphasia, negative receptive aphasia.        Labs on Admission:  Basic Metabolic Panel: Recent Labs  Lab 03/11/23 2013 03/12/23 1131 03/12/23 1812  NA 136 134* 135  K 3.1* 3.7 3.7  CL 100 99 101  CO2 27 21* 27  GLUCOSE 86 95 88  BUN 11 10 9   CREATININE 1.11 1.19 1.07  CALCIUM 9.4 10.6* 9.1  MG  --   --  2.3  PHOS  --   --  3.7   Liver Function Tests: Recent Labs  Lab 03/11/23 2013 03/12/23 1206 03/12/23 1812  AST 18 25 20   ALT 20 21 23   ALKPHOS 37* 40 35*  BILITOT 1.1 1.3* 1.3*  PROT 7.4 8.3* 7.6  ALBUMIN 4.3 4.8 4.5   Recent Labs  Lab 03/11/23 2013 03/12/23 1131  LIPASE 18 16   No results for input(s): "AMMONIA" in the last 168 hours. CBC: Recent Labs  Lab 03/11/23 1831 03/12/23 1131 03/12/23 1812  WBC 4.4 5.0 5.2  NEUTROABS 2.5  --  2.9  HGB 16.2 16.8 15.3  HCT 50.3 51.8 49.5  MCV 75.0* 75.2* 78.3*  PLT 270 249 241   Cardiac Enzymes: No results for input(s): "CKTOTAL", "CKMB", "CKMBINDEX", "TROPONINI" in the last 168 hours.  BNP (last 3 results) No  results for input(s): "BNP" in the last 8760 hours.  ProBNP (last 3 results) No results for input(s): "PROBNP" in the last 8760 hours.  CBG: No results for input(s): "GLUCAP" in the last 168 hours.  Radiological Exams on Admission: US Abdomen Limited  RUQ (LIVER/GB)  Result Date: 03/12/2023 CLINICAL DATA:  Right upper quadrant pain, nausea and vomiting. EXAM: ULTRASOUND ABDOMEN LIMITED RIGHT UPPER QUADRANT COMPARISON:  06/06/2013, CT 03/11/2023 and MRI abdomen 11/11/2023 FINDINGS: Gallbladder: No gallstones or wall thickening visualized. No sonographic Murphy sign noted by sonographer. Common bile duct: Not visualized. Liver: Multiple known large hemangiomas. Caudate lobe mass measures 6 x 6.9 x 8.1 cm. Right lobe mass measures 13.1 x 5.4 x 13.1 cm. Portal vein is patent on color Doppler imaging with normal direction of blood flow towards the liver. Other: No free fluid. IMPRESSION: 1. No acute hepatobiliary disease. 2. Multiple known large hemangiomas. Electronically Signed   By: Marin Olp M.D.   On: 03/12/2023 13:20   CT Angio Chest/Abd/Pel for Dissection W and/or Wo Contrast  Result Date: 03/11/2023 CLINICAL DATA:  Epigastric pain, concern for acute aortic syndrome. EXAM: CT ANGIOGRAPHY CHEST, ABDOMEN AND PELVIS TECHNIQUE: Non-contrast CT of the chest was initially obtained. Multidetector CT imaging through the chest, abdomen and pelvis was performed using the standard protocol during bolus administration of intravenous contrast. Multiplanar reconstructed images and MIPs were obtained and reviewed to evaluate the vascular anatomy. RADIATION DOSE REDUCTION: This exam was performed according to the departmental dose-optimization program which includes automated exposure control, adjustment of the mA and/or kV according to patient size and/or use of iterative reconstruction technique. CONTRAST:  167mL OMNIPAQUE IOHEXOL 350 MG/ML SOLN COMPARISON:  CT chest dated 04/03/2020 and MR abdomen dated  11/10/2013. FINDINGS: CTA CHEST FINDINGS Cardiovascular: Preferential opacification of the thoracic aorta. No evidence of thoracic aortic intramural hematoma, aneurysm or dissection. Normal heart size. No pericardial effusion. Mediastinum/Nodes: No enlarged mediastinal, hilar, or axillary lymph nodes. Thyroid gland, trachea, and esophagus demonstrate no significant findings. Lungs/Pleura: Lungs are clear. No pleural effusion or pneumothorax. Musculoskeletal: Degenerative changes are seen in the spine. Review of the MIP images confirms the above findings. CTA ABDOMEN AND PELVIS FINDINGS VASCULAR Aorta: Normal caliber aorta without aneurysm, dissection, vasculitis or significant stenosis. Celiac: Patent without evidence of aneurysm, dissection, vasculitis or significant stenosis. SMA: Patent without evidence of aneurysm, dissection, vasculitis or significant stenosis. Renals: Both renal arteries are patent without evidence of aneurysm, dissection, vasculitis, fibromuscular dysplasia or significant stenosis. IMA: Patent without evidence of aneurysm, dissection, vasculitis or significant stenosis. Inflow: Mild atherosclerotic disease of the right common iliac artery. No aneurysm, dissection, vasculitis or significant stenosis. Veins: No obvious venous abnormality within the limitations of this arterial phase study. Review of the MIP images confirms the above findings. NON-VASCULAR Hepatobiliary: Multiple liver hemangiomas are redemonstrated. No gallstones, gallbladder wall thickening, or bile duct dilatation. Pancreas: Unremarkable. No pancreatic ductal dilatation or surrounding inflammatory changes. Spleen: Normal in size without focal abnormality. Adrenals/Urinary Tract: Adrenal glands are unremarkable. Kidneys are normal, without renal calculi, focal lesion, or hydronephrosis. Bladder is unremarkable. Stomach/Bowel: The patient is status post a sleeve gastrectomy. Insert appendectomy There is colonic diverticulosis  without evidence of diverticulitis. no evidence of bowel wall thickening, distention, or inflammatory changes. Lymphatic: No lymphadenopathy in the abdomen or pelvis. Reproductive: Prostate is unremarkable. Other: No abdominal wall hernia or abnormality. No abdominopelvic ascites. Musculoskeletal: No acute or significant osseous findings. Review of the MIP images confirms the above findings. IMPRESSION: 1. No evidence for intramural hematoma, aortic dissection, or aneurysm. 2. No acute process in the chest, abdomen, or pelvis. Electronically Signed   By: Zerita Boers M.D.   On: 03/11/2023 19:33    EKG: Pending, however [facility] ED EKG abnormal.  T wave inversions in  2 3 aVF as well as V5-V6.  EKG changes similar to 03/11/2023 however different from last EKG of 09/15/2015.   Assessment/Plan Principal Problem:   RUQ pain Active Problems:   OSA (obstructive sleep apnea)   HTN (hypertension)   Obesity (BMI 30-39.9)   Abnormal EKG   Chest pain  RUQ pain - CTA chest, abdomen, pelvis negative for aortic aneurysm. - [place] RUQ multiple large hemangiomas see above.  Surgery felt this may be because of pain and requested that we admit.  Discussed case with Dr. Christie Beckers, CCS, will see  Essential HTN - Currently controlled. - Continue home BP medication  Abnormal EKG - Echocardiogram pending  OSA - CPAP per respiratory.  Instructed can bring his mask in from home.  Obesity 33.57 kg/m. - Address with PCP    Mobility Assessment (last 72 hours)     Mobility Assessment     Row Name 03/12/23 1600           Does patient have an order for bedrest or is patient medically unstable No - Continue assessment       What is the highest level of mobility based on the progressive mobility assessment? Level 6 (Walks independently in room and hall) - Balance while walking in room without assist - Complete                Code Status: Full (DVT Prophylaxis: Subcu heparin Family Communication: 3/17  family at bedside for discussion plan of care all questions answered Status is: Inpatient    Dispo: The patient is from:               Anticipated d/c is to: Home              Anticipated d/c date is: 3 days              Patient currently is not medically stable to d/c.     Data Reviewed: Care during the described time interval was provided by me .  I have reviewed this patient's available data, including medical history, events of note, physical examination, and all test results as part of my evaluation.   The patient is critically ill with multiple organ systems failure and requires high complexity decision making for assessment and support, frequent evaluation and titration of therapies, application of advanced monitoring technologies and extensive interpretation of multiple databases. Critical Care Time devoted to patient care services described in this note  Time spent: 70 minutes   Zekiah Caruth, [facility]

## 2023-03-12 NOTE — ED Provider Notes (Signed)
[facility] Provider Note   CSN: [id number] Arrival date & time: 03/12/23  1031     History  No chief complaint on file.   [place] Viona Gilmore Kerin is a 52 y.o. male.  52 year old male who presents with several days of upper quadrant and epigastric Donnell pain.  Seen here yesterday for similar symptoms and that visit was reviewed.  Concern for possible aortic dissection and there is no evidence of that.  His pain was relieved slightly but then came back again.  Pain is right lower quadrant goes through to his back.  Has had nausea and vomiting.  No fever or chills but denies any urinary symptoms.       Home Medications Prior to Admission medications   Medication Sig Start Date End Date Taking? Authorizing Provider  anastrozole (ARIMIDEX) 1 MG tablet Take 1 mg by mouth once a week. 02/10/22   [provider]  cefdinir (OMNICEF) 300 MG capsule Take 1 capsule (300 mg total) by mouth 2 (two) times daily. 01/19/23   Plotnikov, Evie Lacks, MD  Cholecalciferol (VITAMIN D3) 2000 units capsule Take 1 capsule (2,000 Units total) daily by mouth. 11/03/17   Plotnikov, Evie Lacks, MD  Cyanocobalamin (B-12 PO) Take 1 tablet by mouth daily.    [provider]  Ginger, Zingiber officinalis, (GINGER PO) Take 1 capsule by mouth daily.    [provider]  latanoprost (XALATAN) 0.005 % ophthalmic solution Place 1 drop into both eyes at bedtime.    [provider]  Multiple Vitamin (MULTIVITAMIN WITH MINERALS) TABS tablet Take 1 tablet by mouth daily.    [provider]  Omega-3 Fatty Acids (FISH OIL) 1000 MG CAPS Take 1,000 mg by mouth daily.    [provider]  ondansetron (ZOFRAN) 4 MG tablet Take 1 tablet (4 mg total) by mouth every 6 (six) hours. 03/11/23   Curatolo, Adam, DO  oxyCODONE (ROXICODONE) 5 MG immediate release tablet Take 1 tablet (5 mg total) by mouth every 6 (six) hours as needed for up to 10 doses  for breakthrough pain. 03/11/23   Curatolo, Adam, DO  pantoprazole (PROTONIX) 20 MG tablet Take 1 tablet (20 mg total) by mouth daily for 14 days. 03/11/23 03/25/23  Lennice Sites, DO  Semaglutide-Weight Management (WEGOVY) 1 MG/0.5ML SOAJ Inject 1 mg into the skin once a week. 01/19/23   Plotnikov, Evie Lacks, MD  sucralfate (CARAFATE) 1 g tablet Take 1 tablet (1 g total) by mouth 4 (four) times daily -  with meals and at bedtime for 14 days. 03/11/23 03/25/23  Curatolo, Adam, DO  tadalafil (CIALIS) 20 MG tablet TAKE 1 TABLET BY MOUTH DAILY AS NEEDED FOR ERECTILE DYSFUNCTION 09/18/21   Plotnikov, Evie Lacks, MD  triamterene-hydrochlorothiazide (DYAZIDE) 37.5-25 MG capsule TAKE 1 CAPSULE BY MOUTH DAILY 11/11/22   Plotnikov, Evie Lacks, MD  TURMERIC PO Take 1 capsule by mouth daily.    [provider]  zolpidem (AMBIEN) 10 MG tablet TAKE 1 TABLET BY MOUTH EVERYDAY AT BEDTIME 01/30/23   Plotnikov, Evie Lacks, MD      Allergies    Patient has no known allergies.    Review of Systems   Review of Systems  All other systems reviewed and are negative.   Physical Exam Updated Vital Signs BP (!) 147/92   Pulse 89   Temp 98 F (36.7 C) (Oral)   Resp (!) 26   SpO2 100%  Physical Exam Vitals and nursing note reviewed.  Constitutional:      General: He is not in acute distress.    Appearance: Normal appearance. He is well-developed. He is not toxic-appearing.  HENT:     Head: Normocephalic and atraumatic.  Eyes:     General: Lids are normal.     Conjunctiva/sclera: Conjunctivae normal.     Pupils: Pupils are equal, round, and reactive to light.  Neck:     Thyroid: No thyroid mass.     Trachea: No tracheal deviation.  Cardiovascular:     Rate and Rhythm: Normal rate and regular rhythm.     Heart sounds: Normal heart sounds. No murmur heard.    No gallop.  Pulmonary:     Effort: Pulmonary effort is normal. No respiratory distress.     Breath sounds: Normal breath sounds. No stridor. No  decreased breath sounds, wheezing, rhonchi or rales.  Abdominal:     General: There is no distension.     Palpations: Abdomen is soft.     Tenderness: There is abdominal tenderness in the right upper quadrant. There is guarding. There is no rebound.    Musculoskeletal:        General: No tenderness. Normal range of motion.     Cervical back: Normal range of motion and neck supple.  Skin:    General: Skin is warm and dry.     Findings: No abrasion or rash.  Neurological:     Mental Status: He is alert and oriented to person, place, and time. Mental status is at baseline.     GCS: GCS eye subscore is 4. GCS verbal subscore is 5. GCS motor subscore is 6.     Cranial Nerves: No cranial nerve deficit.     Sensory: No sensory deficit.     Motor: Motor function is intact.  Psychiatric:        Attention and Perception: Attention normal.        Speech: Speech normal.        Behavior: Behavior normal.     ED Results / Procedures / Treatments   Labs (all labs ordered are listed, but only abnormal results are displayed) Labs Reviewed  CBC - Abnormal; Notable for the following components:      Result Value   RBC 6.89 (*)    MCV 75.2 (*)    MCH 24.4 (*)    RDW 18.6 (*)    All other components within normal limits  BASIC METABOLIC PANEL  LIPASE, BLOOD    EKG None  Radiology CT Angio Chest/Abd/Pel for Dissection W and/or Wo Contrast  Result Date: 03/11/2023 CLINICAL DATA:  Epigastric pain, concern for acute aortic syndrome. EXAM: CT ANGIOGRAPHY CHEST, ABDOMEN AND PELVIS TECHNIQUE: Non-contrast CT of the chest was initially obtained. Multidetector CT imaging through the chest, abdomen and pelvis was performed using the standard protocol during bolus administration of intravenous contrast. Multiplanar reconstructed images and MIPs were obtained and reviewed to evaluate the vascular anatomy. RADIATION DOSE REDUCTION: This exam was performed according to the departmental dose-optimization  program which includes automated exposure control, adjustment of the mA and/or kV according to patient size and/or use of iterative reconstruction technique. CONTRAST:  117mL OMNIPAQUE IOHEXOL 350 MG/ML SOLN COMPARISON:  CT chest dated 04/03/2020 and MR abdomen dated 11/10/2013. FINDINGS: CTA CHEST FINDINGS Cardiovascular: Preferential opacification of the thoracic aorta. No evidence of thoracic aortic intramural hematoma, aneurysm or dissection. Normal heart size. No pericardial effusion. Mediastinum/Nodes: No enlarged mediastinal, hilar, or axillary lymph nodes. Thyroid gland, trachea, and  esophagus demonstrate no significant findings. Lungs/Pleura: Lungs are clear. No pleural effusion or pneumothorax. Musculoskeletal: Degenerative changes are seen in the spine. Review of the MIP images confirms the above findings. CTA ABDOMEN AND PELVIS FINDINGS VASCULAR Aorta: Normal caliber aorta without aneurysm, dissection, vasculitis or significant stenosis. Celiac: Patent without evidence of aneurysm, dissection, vasculitis or significant stenosis. SMA: Patent without evidence of aneurysm, dissection, vasculitis or significant stenosis. Renals: Both renal arteries are patent without evidence of aneurysm, dissection, vasculitis, fibromuscular dysplasia or significant stenosis. IMA: Patent without evidence of aneurysm, dissection, vasculitis or significant stenosis. Inflow: Mild atherosclerotic disease of the right common iliac artery. No aneurysm, dissection, vasculitis or significant stenosis. Veins: No obvious venous abnormality within the limitations of this arterial phase study. Review of the MIP images confirms the above findings. NON-VASCULAR Hepatobiliary: Multiple liver hemangiomas are redemonstrated. No gallstones, gallbladder wall thickening, or bile duct dilatation. Pancreas: Unremarkable. No pancreatic ductal dilatation or surrounding inflammatory changes. Spleen: Normal in size without focal abnormality.  Adrenals/Urinary Tract: Adrenal glands are unremarkable. Kidneys are normal, without renal calculi, focal lesion, or hydronephrosis. Bladder is unremarkable. Stomach/Bowel: The patient is status post a sleeve gastrectomy. Insert appendectomy There is colonic diverticulosis without evidence of diverticulitis. no evidence of bowel wall thickening, distention, or inflammatory changes. Lymphatic: No lymphadenopathy in the abdomen or pelvis. Reproductive: Prostate is unremarkable. Other: No abdominal wall hernia or abnormality. No abdominopelvic ascites. Musculoskeletal: No acute or significant osseous findings. Review of the MIP images confirms the above findings. IMPRESSION: 1. No evidence for intramural hematoma, aortic dissection, or aneurysm. 2. No acute process in the chest, abdomen, or pelvis. Electronically Signed   By: Zerita Boers M.D.   On: 03/11/2023 19:33    Procedures Procedures    Medications Ordered in ED Medications  lactated ringers bolus 1,000 mL (has no administration in time range)  lactated ringers infusion (has no administration in time range)  ondansetron (ZOFRAN) injection 4 mg (has no administration in time range)  ondansetron (ZOFRAN) injection 4 mg (4 mg Intravenous Given 03/12/23 1133)  HYDROmorphone (DILAUDID) injection 2 mg (1 mg Intravenous Given 03/12/23 1141)    ED Course/ Medical Decision Making/ A&P                             Medical Decision Making Amount and/or Complexity of Data Reviewed Labs: ordered. Radiology: ordered.  Risk Prescription drug management.   Patient medicated for pain here resting comfortably.  Abdominal Performed and Shows Large Hemangiomas in His Liver.  Suspect This Could Be the Etiology of His Pain.  Discussed with General Surgeon On-Call, Dr. Grandville Silos, Recommends Admission for Further Evaluation.  Will Consult Hospitalist Team        Final Clinical Impression(s) / ED Diagnoses Final diagnoses:  None    Rx / DC  Orders ED Discharge Orders     None         Lacretia Leigh, MD 03/12/23 1422

## 2023-03-12 NOTE — ED Triage Notes (Signed)
Pt was seen yesterday, received dx of GERD, was feeling betterupon discharge after we had medicated and given fluids. About 2 hours after he arrived home, pain returned and has intensified. States his mother has hx of gallbladder stones. Pt is in obvious pain ,can hardly stand due to pain. Pain is on both sides today

## 2023-03-12 NOTE — Plan of Care (Signed)
Plan of Care Note for accepted transfer   Patient: Nicholas Stevens MRN: [id number]   DOA: 03/12/2023  Facility requesting transfer: [facility] Requesting Provider: Dr. Zenia Resides Reason for transfer: Right upper quadrant pain Facility course: Patient presented to the ED with several days of right upper quadrant abdominal and epigastric pain.  Patient seen in the ED 03/11/2023 with similar symptoms which states occurred after eating some hot dogs.  Patient with nausea and epigastric and upper abdominal pain radiating to the back.  CT angiogram chest abdomen and pelvis done on 03/11/2023 with no acute findings noted.  Lipase noted to be normal.  Patient treated with Protonix Carafate Zofran with some improvement in symptoms and discharged home.  Patient presenting back with reoccurrence of significant abdominal pain.  Vitals noted to be stable.  Basic metabolic profile with a sodium of 134 bicarb of 21, calcium of 10.6 otherwise within normal limits.  Hepatic panel with a bilirubin of 1.3 otherwise within normal limits.  EKG done with T wave inversions in 2 3 aVF as well as V5-V6.  EKG changes similar to 03/11/2023 however different from last EKG of 09/15/2015.  Right upper quadrant ultrasound done with no acute hepatobiliary disease, multiple known large hemangiomas noted.  Patient given IV pain medications of Dilaudid, IV antiemetics and placed on IV fluids.  EDP discussed case with general surgery and he was recommended patient be admitted to the hospitalist service for further evaluation and general surgery to assess/consult on patient for further evaluation.  It was felt by EDP that abdominal pain could likely be secondary to large hemangiomas noted in the liver on right upper quadrant ultrasound.  Plan of care: The patient is accepted for admission to Telemetry unit, at [facility].. -Please follow-up on cardiac enzymes as patient noted to have EKG changes.  Unsure as to whether formal consult was  obtained so once patient gets here touch base with cardiology to see whether patient is on the list and if not obtain a formal consult for further evaluation.  Author: Irine Seal, MD 03/12/2023  Check [email or web address] for on-call coverage.  Nursing staff, Please call [facility] number on Amion as soon as patient's arrival, so appropriate admitting provider can evaluate the pt.

## 2023-03-12 NOTE — Consult Note (Signed)
Reason for Consult: Abdominal pain Referring Physician: Sherral Hammers MD  Nicholas Stevens is an 52 y.o. male.  HPI: Pleasant 52 year old male with a 2-day history of epigastric abdominal pain.  Started Friday after eating a hot dog.  He developed what felt like epigastric discomfort and something being stuck.  This caused significant epigastric abdominal pain.  He was seen in the emergency room and sent home after workup was negative except for hemangioma in his liver.  He returned with similar pain after going home.  He was transferred to [facility] for evaluation.  CT scan shows a large right lobe hemangioma.  Otherwise remainder of his workup was negative.  Ultrasound did not show any gallstones.  This is the first time he had this pain.  Is quite severe.  It seems to have lessened since he is arrived here.  History of gastric sleeve with Dr. Hassell Done in 2014.  Past Medical History:  Diagnosis Date   DDD (degenerative disc disease), lumbar    HNP (herniated nucleus pulposus)    Hypertension    Liver hemangioma    Morbid obesity (HCC)    OSA (obstructive sleep apnea)    no cpap used since weight loss   Overweight(278.02)    Plantar fasciitis of right foot    Sleep apnea    Tinea barbae     Past Surgical History:  Procedure Laterality Date   BREATH TEK H PYLORI N/A 08/20/2013   Procedure: BREATH TEK H PYLORI;  Surgeon: Pedro Earls, MD;  Location: [facility];  Service: General;  Laterality: N/A;   COLONOSCOPY WITH PROPOFOL N/A 05/05/2022   Procedure: COLONOSCOPY WITH PROPOFOL;  Surgeon: Jerene Bears, MD;  Location: [facility];  Service: Gastroenterology;  Laterality: N/A;   LAPAROSCOPIC APPENDECTOMY  2007   Dr Marlou Starks   LAPAROSCOPIC GASTRIC SLEEVE RESECTION N/A 10/08/2013   Procedure: LAPAROSCOPIC SLEEVE GASTRECTOMY;  Surgeon: Pedro Earls, MD;  Location: [facility];  Service: General;  Laterality: N/A;   LUMBAR LAMINECTOMY/DECOMPRESSION MICRODISCECTOMY N/A 09/16/2015   Procedure:  MICRO LUMBAR DECOMPRESSION, MICRODISCECTOMY L5 - S1;  Surgeon: Susa Day, MD;  Location: [facility];  Service: Orthopedics;  Laterality: N/A;   POLYPECTOMY  05/05/2022   Procedure: POLYPECTOMY;  Surgeon: Jerene Bears, MD;  Location: [facility];  Service: Gastroenterology;;   ROTATOR CUFF REPAIR Right 2005   Dr Sharol Given    Family History  Problem Relation Age of Onset   Other Mother        hx of DJD   Liver disease Father        ETOH, Hep C   Hypertension Father    Colon cancer Neg Hx    Colon polyps Neg Hx    Esophageal cancer Neg Hx    Rectal cancer Neg Hx    Stomach cancer Neg Hx     Social History:  reports that he has never smoked. He has never used smokeless tobacco. He reports that he does not drink alcohol and does not use drugs.  Allergies: No Known Allergies  Medications: I have reviewed the patient's current medications.  Results for orders placed or performed during the hospital encounter of 03/12/23 (from the past 48 hour(s))  CBC     Status: Abnormal   Collection Time: 03/12/23 11:31 AM  Result Value Ref Range   WBC 5.0 4.0 - 10.5 K/uL   RBC 6.89 (H) 4.22 - 5.81 MIL/uL   Hemoglobin 16.8 13.0 - 17.0 g/dL   HCT 51.8 39.0 -  52.0 %   MCV 75.2 (L) 80.0 - 100.0 fL   MCH 24.4 (L) 26.0 - 34.0 pg   MCHC 32.4 30.0 - 36.0 g/dL   RDW 18.6 (H) 11.5 - 15.5 %   Platelets 249 150 - 400 K/uL   nRBC 0.0 0.0 - 0.2 %    Comment: Performed at [facility], [address], [place], [place] [id number]  Basic metabolic panel     Status: Abnormal   Collection Time: 03/12/23 11:31 AM  Result Value Ref Range   Sodium 134 (L) 135 - 145 mmol/L   Potassium 3.7 3.5 - 5.1 mmol/L   Chloride 99 98 - 111 mmol/L   CO2 21 (L) 22 - 32 mmol/L   Glucose, Bld 95 70 - 99 mg/dL    Comment: Glucose reference range applies only to samples taken after fasting for at least 8 hours.   BUN 10 6 - 20 mg/dL   Creatinine, Ser 1.19 0.61 - 1.24 mg/dL   Calcium 10.6 (H) 8.9 - 10.3 mg/dL    GFR, Estimated >60 >60 mL/min    Comment: (NOTE) Calculated using the CKD-EPI Creatinine Equation (2021)    Anion gap 14 5 - 15    Comment: Performed at [facility], [place], [place] [address]  Lipase, blood     Status: None   Collection Time: 03/12/23 11:31 AM  Result Value Ref Range   Lipase 16 11 - 51 U/L    Comment: Performed at [facility], [place], [place] [address]  Troponin I (High Sensitivity)     Status: None   Collection Time: 03/12/23 11:31 AM  Result Value Ref Range   Troponin I (High Sensitivity) 4 <18 ng/L    Comment: (NOTE) Elevated high sensitivity troponin I (hsTnI) values and significant  changes across serial measurements may suggest ACS but many other  chronic and acute conditions are known to elevate hsTnI results.  Refer to the "Links" section for chest pain algorithms and additional  guidance. Performed at [facility], [address], [place], [place] [address]   Hepatic function panel     Status: Abnormal   Collection Time: 03/12/23 12:06 PM  Result Value Ref Range   Total Protein 8.3 (H) 6.5 - 8.1 g/dL   Albumin 4.8 3.5 - 5.0 g/dL   AST 25 15 - 41 U/L   ALT 21 0 - 44 U/L   Alkaline Phosphatase 40 38 - 126 U/L   Total Bilirubin 1.3 (H) 0.3 - 1.2 mg/dL   Bilirubin, Direct 0.4 (H) 0.0 - 0.2 mg/dL   Indirect Bilirubin 0.9 0.3 - 0.9 mg/dL    Comment: Performed at [facility], [address] [place], [place] [address]  Comprehensive metabolic panel     Status: Abnormal   Collection Time: 03/12/23  6:12 PM  Result Value Ref Range   Sodium 135 135 - 145 mmol/L   Potassium 3.7 3.5 - 5.1 mmol/L   Chloride 101 98 - 111 mmol/L   CO2 27 22 - 32 mmol/L   Glucose, Bld 88 70 - 99 mg/dL    Comment: Glucose reference range applies only to samples taken after fasting for at least 8 hours.   BUN 9 6 - 20 mg/dL   Creatinine, Ser 1.07 0.61 - 1.24  mg/dL   Calcium 9.1 8.9 - 10.3 mg/dL   Total Protein 7.6 6.5 - 8.1 g/dL   Albumin 4.5 3.5 - 5.0 g/dL  AST 20 15 - 41 U/L   ALT 23 0 - 44 U/L   Alkaline Phosphatase 35 (L) 38 - 126 U/L   Total Bilirubin 1.3 (H) 0.3 - 1.2 mg/dL   GFR, Estimated >60 >60 mL/min    Comment: (NOTE) Calculated using the CKD-EPI Creatinine Equation (2021)    Anion gap 7 5 - 15    Comment: Performed at [facility], [place] [address], [place], [place] [address]  Magnesium     Status: None   Collection Time: 03/12/23  6:12 PM  Result Value Ref Range   Magnesium 2.3 1.7 - 2.4 mg/dL    Comment: Performed at [facility], [place] [address], [place], [place] [address]  Phosphorus     Status: None   Collection Time: 03/12/23  6:12 PM  Result Value Ref Range   Phosphorus 3.7 2.5 - 4.6 mg/dL    Comment: Performed at [facility], [place] [address], [place], [place] [address]  TSH     Status: None   Collection Time: 03/12/23  6:12 PM  Result Value Ref Range   TSH 0.715 0.350 - 4.500 uIU/mL    Comment: Performed by a 3rd Generation assay with a functional sensitivity of <=0.01 uIU/mL. Performed at [facility], [place] [address] [place], [place] [address]   CBC with Differential/Platelet     Status: Abnormal   Collection Time: 03/12/23  6:12 PM  Result Value Ref Range   WBC 5.2 4.0 - 10.5 K/uL   RBC 6.32 (H) 4.22 - 5.81 MIL/uL   Hemoglobin 15.3 13.0 - 17.0 g/dL   HCT 49.5 39.0 - 52.0 %   MCV 78.3 (L) 80.0 - 100.0 fL   MCH 24.2 (L) 26.0 - 34.0 pg   MCHC 30.9 30.0 - 36.0 g/dL   RDW 18.5 (H) 11.5 - 15.5 %   Platelets 241 150 - 400 K/uL   nRBC 0.0 0.0 - 0.2 %   Neutrophils Relative % 55 %   Neutro Abs 2.9 1.7 - 7.7 K/uL   Lymphocytes Relative 37 %   Lymphs Abs 1.9 0.7 - 4.0 K/uL   Monocytes Relative 7 %   Monocytes Absolute 0.4 0.1 - 1.0 K/uL   Eosinophils Relative 1 %   Eosinophils Absolute 0.0 0.0 - 0.5 K/uL   Basophils Relative 0 %    Basophils Absolute 0.0 0.0 - 0.1 K/uL   Immature Granulocytes 0 %   Abs Immature Granulocytes 0.00 0.00 - 0.07 K/uL    Comment: Performed at [facility], [place] [address] [place], [place] [address]    US Abdomen Limited RUQ (LIVER/GB)  Result Date: 03/12/2023 CLINICAL DATA:  Right upper quadrant pain, nausea and vomiting. EXAM: ULTRASOUND ABDOMEN LIMITED RIGHT UPPER QUADRANT COMPARISON:  06/06/2013, CT 03/11/2023 and MRI abdomen 11/11/2023 FINDINGS: Gallbladder: No gallstones or wall thickening visualized. No sonographic Murphy sign noted by sonographer. Common bile duct: Not visualized. Liver: Multiple known large hemangiomas. Caudate lobe mass measures 6 x 6.9 x 8.1 cm. Right lobe mass measures 13.1 x 5.4 x 13.1 cm. Portal vein is patent on color Doppler imaging with normal direction of blood flow towards the liver. Other: No free fluid. IMPRESSION: 1. No acute hepatobiliary disease. 2. Multiple known large hemangiomas. Electronically Signed   By: Marin Olp M.D.   On: 03/12/2023 13:20   CT Angio Chest/Abd/Pel for Dissection W and/or Wo Contrast  Result Date: 03/11/2023 CLINICAL DATA:  Epigastric pain, concern for acute  aortic syndrome. EXAM: CT ANGIOGRAPHY CHEST, ABDOMEN AND PELVIS TECHNIQUE: Non-contrast CT of the chest was initially obtained. Multidetector CT imaging through the chest, abdomen and pelvis was performed using the standard protocol during bolus administration of intravenous contrast. Multiplanar reconstructed images and MIPs were obtained and reviewed to evaluate the vascular anatomy. RADIATION DOSE REDUCTION: This exam was performed according to the departmental dose-optimization program which includes automated exposure control, adjustment of the mA and/or kV according to patient size and/or use of iterative reconstruction technique. CONTRAST:  179mL OMNIPAQUE IOHEXOL 350 MG/ML SOLN COMPARISON:  CT chest dated 04/03/2020 and MR abdomen dated 11/10/2013.  FINDINGS: CTA CHEST FINDINGS Cardiovascular: Preferential opacification of the thoracic aorta. No evidence of thoracic aortic intramural hematoma, aneurysm or dissection. Normal heart size. No pericardial effusion. Mediastinum/Nodes: No enlarged mediastinal, hilar, or axillary lymph nodes. Thyroid gland, trachea, and esophagus demonstrate no significant findings. Lungs/Pleura: Lungs are clear. No pleural effusion or pneumothorax. Musculoskeletal: Degenerative changes are seen in the spine. Review of the MIP images confirms the above findings. CTA ABDOMEN AND PELVIS FINDINGS VASCULAR Aorta: Normal caliber aorta without aneurysm, dissection, vasculitis or significant stenosis. Celiac: Patent without evidence of aneurysm, dissection, vasculitis or significant stenosis. SMA: Patent without evidence of aneurysm, dissection, vasculitis or significant stenosis. Renals: Both renal arteries are patent without evidence of aneurysm, dissection, vasculitis, fibromuscular dysplasia or significant stenosis. IMA: Patent without evidence of aneurysm, dissection, vasculitis or significant stenosis. Inflow: Mild atherosclerotic disease of the right common iliac artery. No aneurysm, dissection, vasculitis or significant stenosis. Veins: No obvious venous abnormality within the limitations of this arterial phase study. Review of the MIP images confirms the above findings. NON-VASCULAR Hepatobiliary: Multiple liver hemangiomas are redemonstrated. No gallstones, gallbladder wall thickening, or bile duct dilatation. Pancreas: Unremarkable. No pancreatic ductal dilatation or surrounding inflammatory changes. Spleen: Normal in size without focal abnormality. Adrenals/Urinary Tract: Adrenal glands are unremarkable. Kidneys are normal, without renal calculi, focal lesion, or hydronephrosis. Bladder is unremarkable. Stomach/Bowel: The patient is status post a sleeve gastrectomy. Insert appendectomy There is colonic diverticulosis without  evidence of diverticulitis. no evidence of bowel wall thickening, distention, or inflammatory changes. Lymphatic: No lymphadenopathy in the abdomen or pelvis. Reproductive: Prostate is unremarkable. Other: No abdominal wall hernia or abnormality. No abdominopelvic ascites. Musculoskeletal: No acute or significant osseous findings. Review of the MIP images confirms the above findings. IMPRESSION: 1. No evidence for intramural hematoma, aortic dissection, or aneurysm. 2. No acute process in the chest, abdomen, or pelvis. Electronically Signed   By: Zerita Boers M.D.   On: 03/11/2023 19:33    Review of Systems  Constitutional: Negative.   HENT: Negative.    Respiratory: Negative.    Cardiovascular: Negative.   Gastrointestinal:  Positive for abdominal pain, nausea and vomiting.  Genitourinary: Negative.   Musculoskeletal: Negative.   Skin: Negative.   Hematological: Negative.   Psychiatric/Behavioral: Negative.     Blood pressure 137/89, pulse 69, temperature 98.4 F (36.9 C), temperature source Oral, resp. rate 17, height 6' 2.5" (1.892 m), weight 120.2 kg, SpO2 99 %. Physical Exam HENT:     Head: Normocephalic.  Cardiovascular:     Rate and Rhythm: Normal rate.  Pulmonary:     Effort: Pulmonary effort is normal.  Abdominal:     General: Abdomen is flat.     Tenderness: There is abdominal tenderness in the epigastric area.     Comments: Mild epigastric tenderness  Musculoskeletal:        General: Normal range of motion.  Neurological:     General: No focal deficit present.     Mental Status: He is alert.  Psychiatric:        Mood and Affect: Mood normal.        Behavior: Behavior normal.     Assessment/Plan: Abdominal pain  Hx of  gastric sleeve  Recommend HIDA study, upper GI to evaluate sleeve and also to evaluate for dyskinesia as Patient flank pain given the correlation with eating.  May need GI consult and EGD at some point  CCS to follow-up in a.m.  Okay to allow  clear liquids for tonight  Joyice Faster Krizia Flight MD 03/12/2023, 7:05 PM      I reviewed recent lab values, vitals, and notes.  This care required moderate level of medical decision making.

## 2023-03-12 NOTE — Plan of Care (Signed)
  Problem: Education: Goal: Knowledge of General Education information will improve Description: Including pain rating scale, medication(s)/side effects and non-pharmacologic comfort measures Outcome: Progressing   Problem: Activity: Goal: Risk for activity intolerance will decrease Outcome: Progressing   Problem: Pain Managment: Goal: General experience of comfort will improve Outcome: Progressing   

## 2023-03-13 ENCOUNTER — Observation Stay (HOSPITAL_COMMUNITY): Payer: Commercial Managed Care - PPO

## 2023-03-13 ENCOUNTER — Observation Stay (HOSPITAL_BASED_OUTPATIENT_CLINIC_OR_DEPARTMENT_OTHER): Payer: Commercial Managed Care - PPO

## 2023-03-13 DIAGNOSIS — E291 Testicular hypofunction: Secondary | ICD-10-CM | POA: Diagnosis not present

## 2023-03-13 DIAGNOSIS — R9431 Abnormal electrocardiogram [ECG] [EKG]: Secondary | ICD-10-CM | POA: Diagnosis not present

## 2023-03-13 DIAGNOSIS — Z7985 Long-term (current) use of injectable non-insulin antidiabetic drugs: Secondary | ICD-10-CM | POA: Diagnosis not present

## 2023-03-13 DIAGNOSIS — R1011 Right upper quadrant pain: Secondary | ICD-10-CM | POA: Diagnosis present

## 2023-03-13 DIAGNOSIS — E669 Obesity, unspecified: Secondary | ICD-10-CM | POA: Diagnosis not present

## 2023-03-13 DIAGNOSIS — Z79899 Other long term (current) drug therapy: Secondary | ICD-10-CM | POA: Diagnosis not present

## 2023-03-13 DIAGNOSIS — R109 Unspecified abdominal pain: Secondary | ICD-10-CM | POA: Diagnosis not present

## 2023-03-13 DIAGNOSIS — R079 Chest pain, unspecified: Secondary | ICD-10-CM | POA: Diagnosis not present

## 2023-03-13 DIAGNOSIS — E871 Hypo-osmolality and hyponatremia: Secondary | ICD-10-CM | POA: Diagnosis not present

## 2023-03-13 DIAGNOSIS — Z6834 Body mass index (BMI) 34.0-34.9, adult: Secondary | ICD-10-CM | POA: Diagnosis not present

## 2023-03-13 DIAGNOSIS — Z560 Unemployment, unspecified: Secondary | ICD-10-CM | POA: Diagnosis not present

## 2023-03-13 DIAGNOSIS — I10 Essential (primary) hypertension: Secondary | ICD-10-CM | POA: Diagnosis not present

## 2023-03-13 DIAGNOSIS — Z86010 Personal history of colonic polyps: Secondary | ICD-10-CM | POA: Diagnosis not present

## 2023-03-13 DIAGNOSIS — Z9884 Bariatric surgery status: Secondary | ICD-10-CM | POA: Diagnosis not present

## 2023-03-13 DIAGNOSIS — E876 Hypokalemia: Secondary | ICD-10-CM | POA: Diagnosis not present

## 2023-03-13 DIAGNOSIS — K828 Other specified diseases of gallbladder: Secondary | ICD-10-CM | POA: Diagnosis not present

## 2023-03-13 DIAGNOSIS — Z903 Acquired absence of stomach [part of]: Secondary | ICD-10-CM

## 2023-03-13 DIAGNOSIS — G4733 Obstructive sleep apnea (adult) (pediatric): Secondary | ICD-10-CM | POA: Diagnosis not present

## 2023-03-13 DIAGNOSIS — I5021 Acute systolic (congestive) heart failure: Secondary | ICD-10-CM | POA: Diagnosis not present

## 2023-03-13 DIAGNOSIS — Z8249 Family history of ischemic heart disease and other diseases of the circulatory system: Secondary | ICD-10-CM | POA: Diagnosis not present

## 2023-03-13 DIAGNOSIS — R112 Nausea with vomiting, unspecified: Secondary | ICD-10-CM | POA: Diagnosis not present

## 2023-03-13 DIAGNOSIS — D1803 Hemangioma of intra-abdominal structures: Secondary | ICD-10-CM | POA: Diagnosis not present

## 2023-03-13 LAB — ECHOCARDIOGRAM COMPLETE
Area-P 1/2: 3.05 cm2
Calc EF: 57.1 %
Height: 74.5 in
S' Lateral: 3 cm
Single Plane A2C EF: 56.5 %
Single Plane A4C EF: 59.7 %
Weight: 4380.98 oz

## 2023-03-13 LAB — COMPREHENSIVE METABOLIC PANEL
ALT: 19 U/L (ref 0–44)
AST: 19 U/L (ref 15–41)
Albumin: 3.6 g/dL (ref 3.5–5.0)
Alkaline Phosphatase: 32 U/L — ABNORMAL LOW (ref 38–126)
Anion gap: 6 (ref 5–15)
BUN: 8 mg/dL (ref 6–20)
CO2: 24 mmol/L (ref 22–32)
Calcium: 8.7 mg/dL — ABNORMAL LOW (ref 8.9–10.3)
Chloride: 102 mmol/L (ref 98–111)
Creatinine, Ser: 0.92 mg/dL (ref 0.61–1.24)
GFR, Estimated: >60 mL/min (ref >60)
Glucose, Bld: 84 mg/dL (ref 70–99)
Potassium: 3.4 mmol/L — ABNORMAL LOW (ref 3.5–5.1)
Sodium: 132 mmol/L — ABNORMAL LOW (ref 135–145)
Total Bilirubin: 1.2 mg/dL (ref 0.3–1.2)
Total Protein: 6.6 g/dL (ref 6.5–8.1)

## 2023-03-13 LAB — CBC WITH DIFFERENTIAL/PLATELET
Abs Immature Granulocytes: 0 10*3/uL (ref 0.00–0.07)
Basophils Absolute: 0 10*3/uL (ref 0.0–0.1)
Basophils Relative: 1 %
Eosinophils Absolute: 0.1 10*3/uL (ref 0.0–0.5)
Eosinophils Relative: 3 %
HCT: 49.6 % (ref 39.0–52.0)
Hemoglobin: 15 g/dL (ref 13.0–17.0)
Immature Granulocytes: 0 %
Lymphocytes Relative: 39 %
Lymphs Abs: 1.4 10*3/uL (ref 0.7–4.0)
MCH: 23.7 pg — ABNORMAL LOW (ref 26.0–34.0)
MCHC: 30.2 g/dL (ref 30.0–36.0)
MCV: 78.5 fL — ABNORMAL LOW (ref 80.0–100.0)
Monocytes Absolute: 0.3 10*3/uL (ref 0.1–1.0)
Monocytes Relative: 8 %
Neutro Abs: 1.8 10*3/uL (ref 1.7–7.7)
Neutrophils Relative %: 49 %
Platelets: 241 10*3/uL (ref 150–400)
RBC: 6.32 MIL/uL — ABNORMAL HIGH (ref 4.22–5.81)
RDW: 18.6 % — ABNORMAL HIGH (ref 11.5–15.5)
WBC: 3.6 10*3/uL — ABNORMAL LOW (ref 4.0–10.5)
nRBC: 0 % (ref 0.0–0.2)

## 2023-03-13 LAB — TROPONIN I (HIGH SENSITIVITY): Troponin I (High Sensitivity): 7 ng/L (ref <18)

## 2023-03-13 LAB — MAGNESIUM: Magnesium: 1.9 mg/dL (ref 1.7–2.4)

## 2023-03-13 LAB — PHOSPHORUS: Phosphorus: 3 mg/dL (ref 2.5–4.6)

## 2023-03-13 MED ORDER — PANTOPRAZOLE SODIUM 20 MG PO TBEC
20.0000 mg | DELAYED_RELEASE_TABLET | Freq: Every day | ORAL | Status: DC
  Administered 2023-03-13 – 2023-03-15 (×3): 20 mg via ORAL
  Filled 2023-03-13 (×3): qty 1

## 2023-03-13 MED ORDER — PERFLUTREN LIPID MICROSPHERE
1.0000 mL | INTRAVENOUS | Status: AC | PRN
  Administered 2023-03-13: 2 mL via INTRAVENOUS

## 2023-03-13 MED ORDER — ONDANSETRON HCL 4 MG PO TABS: 4.0000 mg | ORAL_TABLET | Freq: Three times a day (TID) | ORAL | Status: DC | PRN

## 2023-03-13 MED ORDER — SUCRALFATE 1 G PO TABS
1.0000 g | ORAL_TABLET | Freq: Three times a day (TID) | ORAL | Status: DC
  Administered 2023-03-13 – 2023-03-15 (×6): 1 g via ORAL
  Filled 2023-03-13 (×6): qty 1

## 2023-03-13 MED ORDER — TECHNETIUM TC 99M MEBROFENIN IV KIT
5.2000 | PACK | Freq: Once | INTRAVENOUS | Status: AC | PRN
  Administered 2023-03-13: 5.2 via INTRAVENOUS

## 2023-03-13 MED ORDER — ZOLPIDEM TARTRATE 10 MG PO TABS
10.0000 mg | ORAL_TABLET | Freq: Every day | ORAL | Status: DC
  Administered 2023-03-13 – 2023-03-14 (×2): 10 mg via ORAL
  Filled 2023-03-13 (×2): qty 1

## 2023-03-13 MED ORDER — POTASSIUM CHLORIDE CRYS ER 20 MEQ PO TBCR
40.0000 meq | EXTENDED_RELEASE_TABLET | Freq: Two times a day (BID) | ORAL | Status: AC
  Administered 2023-03-13 – 2023-03-14 (×2): 40 meq via ORAL
  Filled 2023-03-13 (×2): qty 2

## 2023-03-13 MED ORDER — MORPHINE SULFATE (PF) 2 MG/ML IV SOLN: 2.0000 mg | INTRAVENOUS | Status: DC | PRN

## 2023-03-13 MED ORDER — TRIAMTERENE-HCTZ 37.5-25 MG PO TABS
1.0000 | ORAL_TABLET | Freq: Every day | ORAL | Status: DC
  Administered 2023-03-13 – 2023-03-15 (×3): 1 via ORAL
  Filled 2023-03-13 (×3): qty 1

## 2023-03-13 NOTE — Progress Notes (Signed)
  Echocardiogram 2D Echocardiogram has been performed.  Nicholas Stevens 03/13/2023, 8:46 AM

## 2023-03-13 NOTE — Plan of Care (Signed)
  Problem: Coping: Goal: Level of anxiety will decrease Outcome: Progressing   Problem: Pain Managment: Goal: General experience of comfort will improve Outcome: Progressing   

## 2023-03-13 NOTE — Progress Notes (Signed)
RUQ pain  Subjective: Pain better today.  So far w/u has been negative.  He feels much better today  Objective: Vital signs in last 24 hours: Temp:  [98 F (36.7 C)-98.6 F (37 C)] 98.5 F (36.9 C) (03/18 0513) Pulse Rate:  [62-91] 85 (03/18 0513) Resp:  [11-28] 18 (03/18 0513) BP: (117-148)/(69-105) 136/83 (03/18 0513) SpO2:  [96 %-100 %] 99 % (03/18 0513) Weight:  [120.2 kg-124.2 kg] 124.2 kg (03/18 0500) Last BM Date : 03/10/23  Intake/Output from previous day: 03/17 0701 - 03/18 0700 In: 2101 [P.O.:360; I.V.:1741] Out: 800 [Urine:800] Intake/Output this shift: No intake/output data recorded.  General appearance: alert and cooperative GI: normal findings: soft  Lab Results:  Results for orders placed or performed during the hospital encounter of 03/12/23 (from the past 24 hour(s))  CBC     Status: Abnormal   Collection Time: 03/12/23 11:31 AM  Result Value Ref Range   WBC 5.0 4.0 - 10.5 K/uL   RBC 6.89 (H) 4.22 - 5.81 MIL/uL   Hemoglobin 16.8 13.0 - 17.0 g/dL   HCT 51.8 39.0 - 52.0 %   MCV 75.2 (L) 80.0 - 100.0 fL   MCH 24.4 (L) 26.0 - 34.0 pg   MCHC 32.4 30.0 - 36.0 g/dL   RDW 18.6 (H) 11.5 - 15.5 %   Platelets 249 150 - 400 K/uL   nRBC 0.0 0.0 - 0.2 %  Basic metabolic panel     Status: Abnormal   Collection Time: 03/12/23 11:31 AM  Result Value Ref Range   Sodium 134 (L) 135 - 145 mmol/L   Potassium 3.7 3.5 - 5.1 mmol/L   Chloride 99 98 - 111 mmol/L   CO2 21 (L) 22 - 32 mmol/L   Glucose, Bld 95 70 - 99 mg/dL   BUN 10 6 - 20 mg/dL   Creatinine, Ser 1.19 0.61 - 1.24 mg/dL   Calcium 10.6 (H) 8.9 - 10.3 mg/dL   GFR, Estimated >60 >60 mL/min   Anion gap 14 5 - 15  Lipase, blood     Status: None   Collection Time: 03/12/23 11:31 AM  Result Value Ref Range   Lipase 16 11 - 51 U/L  Troponin I (High Sensitivity)     Status: None   Collection Time: 03/12/23 11:31 AM  Result Value Ref Range   Troponin I (High Sensitivity) 4 <18 ng/L  Hepatic function panel      Status: Abnormal   Collection Time: 03/12/23 12:06 PM  Result Value Ref Range   Total Protein 8.3 (H) 6.5 - 8.1 g/dL   Albumin 4.8 3.5 - 5.0 g/dL   AST 25 15 - 41 U/L   ALT 21 0 - 44 U/L   Alkaline Phosphatase 40 38 - 126 U/L   Total Bilirubin 1.3 (H) 0.3 - 1.2 mg/dL   Bilirubin, Direct 0.4 (H) 0.0 - 0.2 mg/dL   Indirect Bilirubin 0.9 0.3 - 0.9 mg/dL  HIV Antibody (routine testing w rflx)     Status: None   Collection Time: 03/12/23  6:12 PM  Result Value Ref Range   HIV Screen 4th Generation wRfx Non Reactive Non Reactive  Comprehensive metabolic panel     Status: Abnormal   Collection Time: 03/12/23  6:12 PM  Result Value Ref Range   Sodium 135 135 - 145 mmol/L   Potassium 3.7 3.5 - 5.1 mmol/L   Chloride 101 98 - 111 mmol/L   CO2 27 22 - 32 mmol/L   Glucose,  Bld 88 70 - 99 mg/dL   BUN 9 6 - 20 mg/dL   Creatinine, Ser 1.07 0.61 - 1.24 mg/dL   Calcium 9.1 8.9 - 10.3 mg/dL   Total Protein 7.6 6.5 - 8.1 g/dL   Albumin 4.5 3.5 - 5.0 g/dL   AST 20 15 - 41 U/L   ALT 23 0 - 44 U/L   Alkaline Phosphatase 35 (L) 38 - 126 U/L   Total Bilirubin 1.3 (H) 0.3 - 1.2 mg/dL   GFR, Estimated >60 >60 mL/min   Anion gap 7 5 - 15  Magnesium     Status: None   Collection Time: 03/12/23  6:12 PM  Result Value Ref Range   Magnesium 2.3 1.7 - 2.4 mg/dL  Phosphorus     Status: None   Collection Time: 03/12/23  6:12 PM  Result Value Ref Range   Phosphorus 3.7 2.5 - 4.6 mg/dL  TSH     Status: None   Collection Time: 03/12/23  6:12 PM  Result Value Ref Range   TSH 0.715 0.350 - 4.500 uIU/mL  CBC with Differential/Platelet     Status: Abnormal   Collection Time: 03/12/23  6:12 PM  Result Value Ref Range   WBC 5.2 4.0 - 10.5 K/uL   RBC 6.32 (H) 4.22 - 5.81 MIL/uL   Hemoglobin 15.3 13.0 - 17.0 g/dL   HCT 49.5 39.0 - 52.0 %   MCV 78.3 (L) 80.0 - 100.0 fL   MCH 24.2 (L) 26.0 - 34.0 pg   MCHC 30.9 30.0 - 36.0 g/dL   RDW 18.5 (H) 11.5 - 15.5 %   Platelets 241 150 - 400 K/uL   nRBC 0.0 0.0 -  0.2 %   Neutrophils Relative % 55 %   Neutro Abs 2.9 1.7 - 7.7 K/uL   Lymphocytes Relative 37 %   Lymphs Abs 1.9 0.7 - 4.0 K/uL   Monocytes Relative 7 %   Monocytes Absolute 0.4 0.1 - 1.0 K/uL   Eosinophils Relative 1 %   Eosinophils Absolute 0.0 0.0 - 0.5 K/uL   Basophils Relative 0 %   Basophils Absolute 0.0 0.0 - 0.1 K/uL   Immature Granulocytes 0 %   Abs Immature Granulocytes 0.00 0.00 - 0.07 K/uL  Troponin I (High Sensitivity)     Status: None   Collection Time: 03/12/23  6:12 PM  Result Value Ref Range   Troponin I (High Sensitivity) 6 <18 ng/L  Troponin I (High Sensitivity)     Status: None   Collection Time: 03/12/23  9:31 PM  Result Value Ref Range   Troponin I (High Sensitivity) 7 <18 ng/L  Troponin I (High Sensitivity)     Status: None   Collection Time: 03/12/23 11:33 PM  Result Value Ref Range   Troponin I (High Sensitivity) 7 <18 ng/L  Comprehensive metabolic panel     Status: Abnormal   Collection Time: 03/13/23  3:19 AM  Result Value Ref Range   Sodium 132 (L) 135 - 145 mmol/L   Potassium 3.4 (L) 3.5 - 5.1 mmol/L   Chloride 102 98 - 111 mmol/L   CO2 24 22 - 32 mmol/L   Glucose, Bld 84 70 - 99 mg/dL   BUN 8 6 - 20 mg/dL   Creatinine, Ser 0.92 0.61 - 1.24 mg/dL   Calcium 8.7 (L) 8.9 - 10.3 mg/dL   Total Protein 6.6 6.5 - 8.1 g/dL   Albumin 3.6 3.5 - 5.0 g/dL   AST 19 15 - 41  U/L   ALT 19 0 - 44 U/L   Alkaline Phosphatase 32 (L) 38 - 126 U/L   Total Bilirubin 1.2 0.3 - 1.2 mg/dL   GFR, Estimated >60 >60 mL/min   Anion gap 6 5 - 15  Magnesium     Status: None   Collection Time: 03/13/23  3:19 AM  Result Value Ref Range   Magnesium 1.9 1.7 - 2.4 mg/dL  Phosphorus     Status: None   Collection Time: 03/13/23  3:19 AM  Result Value Ref Range   Phosphorus 3.0 2.5 - 4.6 mg/dL     Studies/Results Radiology     MEDS, Scheduled  aspirin EC  81 mg Oral Daily   heparin  5,000 Units Subcutaneous Q8H   ondansetron  4 mg Intravenous Once    triamterene-hydrochlorothiazide  1 tablet Oral Daily     Assessment: RUQ pain    Plan: HIDA ordered to complete GB eval. UGI ordered to eval gastric sleeve better Will await these findings before making any further recs   LOS: 0 days    [name], MD [facility]  Patient's medical decision making was straightforward (25 mins met or exceeded with patient care and documentation).   03/13/2023 8:49 AM

## 2023-03-13 NOTE — Progress Notes (Signed)
Heart Failure Navigator Progress Note  Assessed for Heart & Vascular TOC clinic readiness.  Patient EF 55-60%, admission not for Acute Heart failure.   Navigator will sign off at this time.   Earnestine Leys, BSN, Clinical cytogeneticist Only

## 2023-03-13 NOTE — Progress Notes (Signed)
PROGRESS NOTE    Nicholas Stevens  [id number] DOB: Oct 14, 1971 DOA: 03/12/2023 PCP: Cassandria Anger, MD     Brief Narrative:  52 yo BM PMHx gastric sleeve, essential HTN, hypogonadism, morbid obesity, OSA on CPAP.   Presents several days of upper quadrant and epigastric pain. Seen here yesterday for similar symptoms and that visit was reviewed. Concern for possible aortic dissection and there is no evidence of that. His pain was relieved slightly but then came back again. Pain is right lower quadrant goes through to his back. Has had nausea and vomiting. No fever or chills but denies any urinary symptoms.  Patient states that pain began when he was eating and drinking, centered RUQ, does not resolve and has worsened over the last 2 days.  Only relieved with pain medication.  Negative episodes in the past.  Although patient has abnormal EKG patient states only cardiac issues known to him was his high blood pressure controlled with medication.     Subjective: A/O x 4, negative abdominal pain, negative nausea.   Assessment & Plan: Covid vaccination;   Principal Problem:   RUQ pain Active Problems:   OSA (obstructive sleep apnea)   H/O gastric sleeve   HTN (hypertension)   Obesity (BMI 30-39.9)   Abnormal EKG   Chest pain   RUQ pain -S/p gastric sleeve - CTA chest, abdomen, pelvis negative for aortic aneurysm. -3/17 LR 157ml/hr - [place] RUQ multiple large hemangiomas see above.  Surgery felt this may be because of pain and  requested that we admit.  Discussed case with Dr. Christie Beckers, [facility], will see -3/18 per [facility] recommend  Recommend HIDA study, upper GI to evaluate sleeve and also to evaluate for dyskinesia as Patient flank pain given the correlation with eating.  May need GI consult and EGD at some point -3/18 UGI and HIDA without significant findings.  See results below - Per surgery advance diet as tolerated .  -3/18 decrease LR 34ml/hr -3/18 decrease morphine 2  mg q4 PRN  H/O Gastric Sleeve -CTA chest/abdomen/pelvis documents s/p sleeve gastrectomy.  However does not mention any problems   Essential HTN - Currently controlled. - Continue home BP medication   Abnormal EKG - Echocardiogram pending  Hypokalemia - Potassium goal> 4 - Potassium IV 60 mEq  Hypomagnesmia -Magnesium goal> 2 - Magnesium IV 2 g   OSA - CPAP per respiratory.  Instructed can bring his mask in from home.   Obesity 34.68 kg/m.. - Address with PCP          Mobility Assessment (last 72 hours)     Mobility Assessment     Row Name 03/12/23 2300 03/12/23 1600         Does patient have an order for bedrest or is patient medically unstable No - Continue assessment No - Continue assessment      What is the highest level of mobility based on the progressive mobility assessment? Level 6 (Walks independently in room and hall) - Balance while walking in room without assist - Complete Level 6 (Walks independently in room and hall) - Balance while walking in room without assist - Complete                     DVT prophylaxis: Subcu heparin Code Status: Full Family Communication: 3/18 wife at bedside for discussion plan of care all questions answered Status is: Inpatient    Dispo: The patient is from: Home  Anticipated d/c is to: Home              Anticipated d/c date is: 2 days              Patient currently is not medically stable to d/c.      Consultants:  [facility] Dr. Christie Beckers   Procedures/Significant Events:  3/16 CTA chest/abdomen/pelvis No evidence for intramural hematoma, aortic dissection, or aneurysm. 2. No acute process in the chest, abdomen, or pelvis. 3/17 US abdomen RUQ Multiple known large hemangiomas. Caudate lobe mass measures 6 x 6.9 x 8.1 cm. Right lobe mass measures 13.1 x 5.4 x 13.1 cm. Portal vein is patent on color Doppler imaging with normal direction of blood flow towards the liver. 3/18 NM Hepato   W/EF -Normal hepatobiliary scan, demonstrating patency of both the cystic and common bile ducts. -Low gallbladder ejection fraction of 28%. 3/18DG UGI No significant findings or explanation for the patient's symptoms. No complication of previous gastric sleeve surgery identified.   I have personally reviewed and interpreted all radiology studies and my findings are as above.  VENTILATOR SETTINGS:    Cultures   Antimicrobials:    Devices    LINES / TUBES:      Continuous Infusions:  lactated ringers 125 mL/hr at 03/13/23 0540   methocarbamol (ROBAXIN) IV       Objective: Vitals:   03/12/23 2113 03/13/23 0036 03/13/23 0500 03/13/23 0513  BP: 123/80 117/74  136/83  Pulse: 71 86  85  Resp: 18 18  18   Temp: 98.4 F (36.9 C) 98.6 F (37 C)  98.5 F (36.9 C)  TempSrc: Oral Oral  Oral  SpO2: 97% 96%  99%  Weight:   124.2 kg   Height:        Intake/Output Summary (Last 24 hours) at 03/13/2023 0840 Last data filed at 03/13/2023 0600 Gross per 24 hour  Intake 2100.97 ml  Output 800 ml  Net 1300.97 ml   Filed Weights   03/12/23 1642 03/13/23 0500  Weight: 120.2 kg 124.2 kg    Examination:  General: A/O x 4, No acute respiratory distress Eyes: negative scleral hemorrhage, negative anisocoria, negative icterus ENT: Negative Runny nose, negative gingival bleeding, Neck:  Negative scars, masses, torticollis, lymphadenopathy, JVD Lungs: Clear to auscultation bilaterally without wheezes or crackles Cardiovascular: Regular rate and rhythm without murmur gallop or rub normal S1 and S2 Abdomen: OBESE, negative abdominal pain, nondistended, positive soft, bowel sounds, no rebound, no ascites, no appreciable mass Extremities: No significant cyanosis, clubbing, or edema bilateral lower extremities Skin: Negative rashes, lesions, ulcers Psychiatric:  Negative depression, negative anxiety, negative fatigue, negative mania  Central nervous system:  Cranial nerves II  through XII intact, tongue/uvula midline, all extremities muscle strength 5/5, sensation intact throughout, negative dysarthria, negative expressive aphasia, negative receptive aphasia.  .     Data Reviewed: Care during the described time interval was provided by me .  I have reviewed this patient's available data, including medical history, events of note, physical examination, and all test results as part of my evaluation.  CBC: Recent Labs  Lab 03/11/23 1831 03/12/23 1131 03/12/23 1812  WBC 4.4 5.0 5.2  NEUTROABS 2.5  --  2.9  HGB 16.2 16.8 15.3  HCT 50.3 51.8 49.5  MCV 75.0* 75.2* 78.3*  PLT 270 249 [id number]   Basic Metabolic Panel: Recent Labs  Lab 03/11/23 2013 03/12/23 1131 03/12/23 1812 03/13/23 0319  NA 136 134* 135 132*  K 3.1* 3.7  3.7 3.4*  CL 100 99 101 102  CO2 27 21* 27 24  GLUCOSE 86 95 88 84  BUN 11 10 9 8   CREATININE 1.11 1.19 1.07 0.92  CALCIUM 9.4 10.6* 9.1 8.7*  MG  --   --  2.3 1.9  PHOS  --   --  3.7 3.0   GFR: Estimated Creatinine Clearance: 130.9 mL/min (by C-G formula based on SCr of 0.92 mg/dL). Liver Function Tests: Recent Labs  Lab 03/11/23 2013 03/12/23 1206 03/12/23 1812 03/13/23 0319  AST 18 25 20 19   ALT 20 21 23 19   ALKPHOS 37* 40 35* 32*  BILITOT 1.1 1.3* 1.3* 1.2  PROT 7.4 8.3* 7.6 6.6  ALBUMIN 4.3 4.8 4.5 3.6   Recent Labs  Lab 03/11/23 2013 03/12/23 1131  LIPASE 18 16   No results for input(s): "AMMONIA" in the last 168 hours. Coagulation Profile: No results for input(s): "INR", "PROTIME" in the last 168 hours. Cardiac Enzymes: No results for input(s): "CKTOTAL", "CKMB", "CKMBINDEX", "TROPONINI" in the last 168 hours. BNP (last 3 results) No results for input(s): "PROBNP" in the last 8760 hours. HbA1C: No results for input(s): "HGBA1C" in the last 72 hours. CBG: No results for input(s): "GLUCAP" in the last 168 hours. Lipid Profile: No results for input(s): "CHOL", "HDL", "LDLCALC", "TRIG", "CHOLHDL", "LDLDIRECT"  in the last 72 hours. Thyroid Function Tests: Recent Labs    03/12/23 1812  TSH 0.715   Anemia Panel: No results for input(s): "VITAMINB12", "FOLATE", "FERRITIN", "TIBC", "IRON", "RETICCTPCT" in the last 72 hours. Sepsis Labs: No results for input(s): "PROCALCITON", "LATICACIDVEN" in the last 168 hours.  No results found for this or any previous visit (from the past 240 hour(s)).       Radiology Studies: DG Chest 2 View  Result Date: 03/12/2023 CLINICAL DATA:  Chest pain. EXAM: CHEST - 2 VIEW COMPARISON:  Chest CTA yesterday FINDINGS: The cardiomediastinal contours are normal. The lungs are clear. Pulmonary vasculature is normal. No consolidation, pleural effusion, or pneumothorax. No acute osseous abnormalities are seen. IMPRESSION: Negative radiographs of the chest. Electronically Signed   By: Keith Rake M.D.   On: 03/12/2023 19:52   US Abdomen Limited RUQ (LIVER/GB)  Result Date: 03/12/2023 CLINICAL DATA:  Right upper quadrant pain, nausea and vomiting. EXAM: ULTRASOUND ABDOMEN LIMITED RIGHT UPPER QUADRANT COMPARISON:  06/06/2013, CT 03/11/2023 and MRI abdomen 11/11/2023 FINDINGS: Gallbladder: No gallstones or wall thickening visualized. No sonographic Murphy sign noted by sonographer. Common bile duct: Not visualized. Liver: Multiple known large hemangiomas. Caudate lobe mass measures 6 x 6.9 x 8.1 cm. Right lobe mass measures 13.1 x 5.4 x 13.1 cm. Portal vein is patent on color Doppler imaging with normal direction of blood flow towards the liver. Other: No free fluid. IMPRESSION: 1. No acute hepatobiliary disease. 2. Multiple known large hemangiomas. Electronically Signed   By: Marin Olp M.D.   On: 03/12/2023 13:20   CT Angio Chest/Abd/Pel for Dissection W and/or Wo Contrast  Result Date: 03/11/2023 CLINICAL DATA:  Epigastric pain, concern for acute aortic syndrome. EXAM: CT ANGIOGRAPHY CHEST, ABDOMEN AND PELVIS TECHNIQUE: Non-contrast CT of the chest was initially  obtained. Multidetector CT imaging through the chest, abdomen and pelvis was performed using the standard protocol during bolus administration of intravenous contrast. Multiplanar reconstructed images and MIPs were obtained and reviewed to evaluate the vascular anatomy. RADIATION DOSE REDUCTION: This exam was performed according to the departmental dose-optimization program which includes automated exposure control, adjustment of the mA and/or kV  according to patient size and/or use of iterative reconstruction technique. CONTRAST:  164mL OMNIPAQUE IOHEXOL 350 MG/ML SOLN COMPARISON:  CT chest dated 04/03/2020 and MR abdomen dated 11/10/2013. FINDINGS: CTA CHEST FINDINGS Cardiovascular: Preferential opacification of the thoracic aorta. No evidence of thoracic aortic intramural hematoma, aneurysm or dissection. Normal heart size. No pericardial effusion. Mediastinum/Nodes: No enlarged mediastinal, hilar, or axillary lymph nodes. Thyroid gland, trachea, and esophagus demonstrate no significant findings. Lungs/Pleura: Lungs are clear. No pleural effusion or pneumothorax. Musculoskeletal: Degenerative changes are seen in the spine. Review of the MIP images confirms the above findings. CTA ABDOMEN AND PELVIS FINDINGS VASCULAR Aorta: Normal caliber aorta without aneurysm, dissection, vasculitis or significant stenosis. Celiac: Patent without evidence of aneurysm, dissection, vasculitis or significant stenosis. SMA: Patent without evidence of aneurysm, dissection, vasculitis or significant stenosis. Renals: Both renal arteries are patent without evidence of aneurysm, dissection, vasculitis, fibromuscular dysplasia or significant stenosis. IMA: Patent without evidence of aneurysm, dissection, vasculitis or significant stenosis. Inflow: Mild atherosclerotic disease of the right common iliac artery. No aneurysm, dissection, vasculitis or significant stenosis. Veins: No obvious venous abnormality within the limitations of this  arterial phase study. Review of the MIP images confirms the above findings. NON-VASCULAR Hepatobiliary: Multiple liver hemangiomas are redemonstrated. No gallstones, gallbladder wall thickening, or bile duct dilatation. Pancreas: Unremarkable. No pancreatic ductal dilatation or surrounding inflammatory changes. Spleen: Normal in size without focal abnormality. Adrenals/Urinary Tract: Adrenal glands are unremarkable. Kidneys are normal, without renal calculi, focal lesion, or hydronephrosis. Bladder is unremarkable. Stomach/Bowel: The patient is status post a sleeve gastrectomy. Insert appendectomy There is colonic diverticulosis without evidence of diverticulitis. no evidence of bowel wall thickening, distention, or inflammatory changes. Lymphatic: No lymphadenopathy in the abdomen or pelvis. Reproductive: Prostate is unremarkable. Other: No abdominal wall hernia or abnormality. No abdominopelvic ascites. Musculoskeletal: No acute or significant osseous findings. Review of the MIP images confirms the above findings. IMPRESSION: 1. No evidence for intramural hematoma, aortic dissection, or aneurysm. 2. No acute process in the chest, abdomen, or pelvis. Electronically Signed   By: Zerita Boers M.D.   On: 03/11/2023 19:33        Scheduled Meds:  aspirin EC  81 mg Oral Daily   heparin  5,000 Units Subcutaneous Q8H   ondansetron  4 mg Intravenous Once   triamterene-hydrochlorothiazide  1 tablet Oral Daily   Continuous Infusions:  lactated ringers 125 mL/hr at 03/13/23 0540   methocarbamol (ROBAXIN) IV       LOS: 0 days    Time spent:40 min    Hiliana Eilts, Geraldo Docker, MD [facility]   If 7PM-7AM, please contact night-coverage 03/13/2023, 8:40 AM

## 2023-03-14 DIAGNOSIS — R1013 Epigastric pain: Secondary | ICD-10-CM

## 2023-03-14 DIAGNOSIS — R9431 Abnormal electrocardiogram [ECG] [EKG]: Secondary | ICD-10-CM | POA: Diagnosis not present

## 2023-03-14 DIAGNOSIS — R718 Other abnormality of red blood cells: Secondary | ICD-10-CM | POA: Diagnosis present

## 2023-03-14 DIAGNOSIS — Z903 Acquired absence of stomach [part of]: Secondary | ICD-10-CM | POA: Diagnosis not present

## 2023-03-14 DIAGNOSIS — D1803 Hemangioma of intra-abdominal structures: Secondary | ICD-10-CM

## 2023-03-14 DIAGNOSIS — R079 Chest pain, unspecified: Secondary | ICD-10-CM | POA: Diagnosis not present

## 2023-03-14 DIAGNOSIS — R1011 Right upper quadrant pain: Secondary | ICD-10-CM | POA: Diagnosis not present

## 2023-03-14 LAB — CBC WITH DIFFERENTIAL/PLATELET
Abs Immature Granulocytes: 0.01 10*3/uL (ref 0.00–0.07)
Basophils Absolute: 0 10*3/uL (ref 0.0–0.1)
Basophils Relative: 0 %
Eosinophils Absolute: 0.1 10*3/uL (ref 0.0–0.5)
Eosinophils Relative: 3 %
HCT: 47.8 % (ref 39.0–52.0)
Hemoglobin: 15.1 g/dL (ref 13.0–17.0)
Immature Granulocytes: 0 %
Lymphocytes Relative: 46 %
Lymphs Abs: 1.7 10*3/uL (ref 0.7–4.0)
MCH: 24.4 pg — ABNORMAL LOW (ref 26.0–34.0)
MCHC: 31.6 g/dL (ref 30.0–36.0)
MCV: 77.2 fL — ABNORMAL LOW (ref 80.0–100.0)
Monocytes Absolute: 0.4 10*3/uL (ref 0.1–1.0)
Monocytes Relative: 9 %
Neutro Abs: 1.5 10*3/uL — ABNORMAL LOW (ref 1.7–7.7)
Neutrophils Relative %: 42 %
Platelets: 233 10*3/uL (ref 150–400)
RBC: 6.19 MIL/uL — ABNORMAL HIGH (ref 4.22–5.81)
RDW: 18.2 % — ABNORMAL HIGH (ref 11.5–15.5)
WBC: 3.7 10*3/uL — ABNORMAL LOW (ref 4.0–10.5)
nRBC: 0 % (ref 0.0–0.2)

## 2023-03-14 LAB — COMPREHENSIVE METABOLIC PANEL
ALT: 21 U/L (ref 0–44)
AST: 15 U/L (ref 15–41)
Albumin: 3.7 g/dL (ref 3.5–5.0)
Alkaline Phosphatase: 34 U/L — ABNORMAL LOW (ref 38–126)
Anion gap: 10 (ref 5–15)
BUN: 8 mg/dL (ref 6–20)
CO2: 25 mmol/L (ref 22–32)
Calcium: 9.1 mg/dL (ref 8.9–10.3)
Chloride: 102 mmol/L (ref 98–111)
Creatinine, Ser: 0.94 mg/dL (ref 0.61–1.24)
GFR, Estimated: >60 mL/min (ref >60)
Glucose, Bld: 82 mg/dL (ref 70–99)
Potassium: 3.7 mmol/L (ref 3.5–5.1)
Sodium: 137 mmol/L (ref 135–145)
Total Bilirubin: 0.8 mg/dL (ref 0.3–1.2)
Total Protein: 6.8 g/dL (ref 6.5–8.1)

## 2023-03-14 LAB — MAGNESIUM: Magnesium: 2 mg/dL (ref 1.7–2.4)

## 2023-03-14 LAB — PHOSPHORUS: Phosphorus: 3.6 mg/dL (ref 2.5–4.6)

## 2023-03-14 NOTE — Progress Notes (Signed)
PROGRESS NOTE    Nicholas Stevens  [id number] DOB: [birth year] DOA: 03/12/2023 PCP: Cassandria Anger, MD     Brief Narrative:  52 yo BM PMHx gastric sleeve, essential HTN, hypogonadism, morbid obesity, OSA on CPAP.   Presents several days of upper quadrant and epigastric pain. Seen here yesterday for similar symptoms and that visit was reviewed. Concern for possible aortic dissection and there is no evidence of that. His pain was relieved slightly but then came back again. Pain is right lower quadrant goes through to his back. Has had nausea and vomiting. No fever or chills but denies any urinary symptoms.  Patient states that pain began when he was eating and drinking, centered RUQ, does not resolve and has worsened over the last 2 days.  Only relieved with pain medication.  Negative episodes in the past.  Although patient has abnormal EKG patient states only cardiac issues known to him was his high blood pressure controlled with medication.     Subjective: A/O x 4, negative abdominal pain, negative nausea postprandial.   Assessment & Plan: Covid vaccination;   Principal Problem:   RUQ pain Active Problems:   OSA (obstructive sleep apnea)   H/O gastric sleeve   HTN (hypertension)   Obesity (BMI 30-39.9)   Abnormal EKG   Chest pain   RUQ pain -S/p gastric sleeve - CTA chest, abdomen, pelvis negative for aortic aneurysm. -3/17 LR 181ml/hr - [place] RUQ multiple large hemangiomas see above.  Surgery felt this may be because of pain and  requested that we admit.  Discussed case with Dr. Christie Beckers, CCS, will see -3/18 per [facility]CS recommend  Recommend HIDA study, upper GI to evaluate sleeve and also to evaluate for dyskinesia as Patient flank pain given the correlation with eating.  May need GI consult and EGD at some point -3/18 UGI and HIDA without significant findings.  See results below - Per surgery advance diet as tolerated .  -3/18 decrease LR 14ml/hr -3/18 decrease  morphine 2 mg q4 PRN -3/19 surgery  if he tolerates soft diet without symptoms he can discharge  -3/19 spoke with surgery concerning my concern of biliary dyskinesia, they stated understood concern however given his gallbladder ejection fraction is not critically low requested I obtain official GI consult. - 3/19 discussed case with Dr. Collene Mares GI, she will have Dr. Benson Norway her partner see patient await  Recommendations  Biliary Dyskinesia -HIDA scan showed Low gallbladder ejection fraction of 28%.  Most likely cause of his RUQ pain   H/O Gastric Sleeve -CTA chest/abdomen/pelvis documents s/p sleeve gastrectomy.  However does not mention any problems   Essential HTN - Currently controlled. - Continue home BP medication   Abnormal EKG - Echocardiogram: Normal see findings below  Hypokalemia - Potassium goal> 4 - Potassium IV 60 mEq  Hypomagnesmia -Magnesium goal> 2 - Magnesium IV 2 g   OSA - CPAP per respiratory.  Instructed can bring his mask in from home.   Obesity 34.68 kg/m.. - Address with PCP          Mobility Assessment (last 72 hours)     Mobility Assessment     Row Name 03/14/23 1019 03/13/23 2200 03/13/23 0800 03/12/23 2300 03/12/23 1600   Does patient have an order for bedrest or is patient medically unstable No - Continue assessment No - Continue assessment No - Continue assessment No - Continue assessment No - Continue assessment   What is the highest level of mobility based on the  progressive mobility assessment? Level 6 (Walks independently in room and hall) - Balance while walking in room without assist - Complete Level 6 (Walks independently in room and hall) - Balance while walking in room without assist - Complete Level 6 (Walks independently in room and hall) - Balance while walking in room without assist - Complete Level 6 (Walks independently in room and hall) - Balance while walking in room without assist - Complete Level 6 (Walks independently in room  and hall) - Balance while walking in room without assist - Complete                  DVT prophylaxis: Subcu heparin Code Status: Full Family Communication: 3/19 wife at bedside for discussion plan of care all questions answered Status is: Inpatient    Dispo: The patient is from: Home              Anticipated d/c is to: Home              Anticipated d/c date is: 2 days              Patient currently is not medically stable to d/c.      Consultants:  [facility]S Dr. Christie Beckers   Procedures/Significant Events:  3/16 CTA chest/abdomen/pelvis No evidence for intramural hematoma, aortic dissection, or aneurysm. 2. No acute process in the chest, abdomen, or pelvis. 3/17 US abdomen RUQ Multiple known large hemangiomas. Caudate lobe mass measures 6 x 6.9 x 8.1 cm. Right lobe mass measures 13.1 x 5.4 x 13.1 cm. Portal vein is patent on color Doppler imaging with normal direction of blood flow towards the liver. 3/18 NM Hepato  W/EF -Normal hepatobiliary scan, demonstrating patency of both the cystic and common bile ducts. -Low gallbladder ejection fraction of 28%. 3/18DG UGI No significant findings or explanation for the patient's symptoms. No complication of previous gastric sleeve surgery identified. 3/18 Echocardiogram  Left Ventricle: Left ventricular ejection fraction, by estimation, is 55  to 60%. The left ventricle has normal function. The left ventricle has no  regional wall motion abnormalities. Definity contrast agent was given IV  to delineate the left ventricular   endocardial borders. The left ventricular internal cavity size was normal  in size. There is no left ventricular hypertrophy. Left ventricular  diastolic parameters were normal.     I have personally reviewed and interpreted all radiology studies and my findings are as above.  VENTILATOR SETTINGS:    Cultures   Antimicrobials:    Devices    LINES / TUBES:      Continuous  Infusions:  lactated ringers 75 mL/hr at 03/13/23 1830   methocarbamol (ROBAXIN) IV       Objective: Vitals:   03/13/23 1458 03/13/23 2017 03/14/23 0500 03/14/23 0626  BP: (!) 142/90 (!) 140/90  138/87  Pulse: 82 65  65  Resp: 16 18  18   Temp: 98.6 F (37 C) 98.4 F (36.9 C)  98 F (36.7 C)  TempSrc:  Oral  Oral  SpO2: 99% 99%  99%  Weight:   124.7 kg   Height:        Intake/Output Summary (Last 24 hours) at 03/14/2023 1026 Last data filed at 03/14/2023 1023 Gross per 24 hour  Intake 3134.28 ml  Output 1050 ml  Net 2084.28 ml    Filed Weights   03/12/23 1642 03/13/23 0500 03/14/23 0500  Weight: 120.2 kg 124.2 kg 124.7 kg   Physical Exam:  General: A/O x  4, No acute respiratory distress Eyes: negative scleral hemorrhage, negative anisocoria, negative icterus ENT: Negative Runny nose, negative gingival bleeding, Neck:  Negative scars, masses, torticollis, lymphadenopathy, JVD Lungs: Clear to auscultation bilaterally without wheezes or crackles Cardiovascular: Regular rate and rhythm without murmur gallop or rub normal S1 and S2 Abdomen: negative abdominal pain, nondistended, positive soft, bowel sounds, no rebound, no ascites, no appreciable mass Extremities: No significant cyanosis, clubbing, or edema bilateral lower extremities Skin: Negative rashes, lesions, ulcers Psychiatric:  Negative depression, negative anxiety, negative fatigue, negative mania  Central nervous system:  Cranial nerves II through XII intact, tongue/uvula midline, all extremities muscle strength 5/5, sensation intact throughout, negative dysarthria, negative expressive aphasia, negative receptive aphasia.       Data Reviewed: Care during the described time interval was provided by me .  I have reviewed this patient's available data, including medical history, events of note, physical examination, and all test results as part of my evaluation.  CBC: Recent Labs  Lab 03/11/23 1831  03/12/23 1131 03/12/23 1812 03/13/23 0917 03/14/23 0333  WBC 4.4 5.0 5.2 3.6* 3.7*  NEUTROABS 2.5  --  2.9 1.8 1.5*  HGB 16.2 16.8 15.3 15.0 15.1  HCT 50.3 51.8 49.5 49.6 47.8  MCV 75.0* 75.2* 78.3* 78.5* 77.2*  PLT 270 249 241 241 0000000    Basic Metabolic Panel: Recent Labs  Lab 03/11/23 2013 03/12/23 1131 03/12/23 1812 03/13/23 0319 03/14/23 0333  NA 136 134* 135 132* 137  K 3.1* 3.7 3.7 3.4* 3.7  CL 100 99 101 102 102  CO2 27 21* 27 24 25   GLUCOSE 86 95 88 84 82  BUN 11 10 9 8 8   CREATININE 1.11 1.19 1.07 0.92 0.94  CALCIUM 9.4 10.6* 9.1 8.7* 9.1  MG  --   --  2.3 1.9 2.0  PHOS  --   --  3.7 3.0 3.6    GFR: Estimated Creatinine Clearance: 128.4 mL/min (by C-G formula based on SCr of 0.94 mg/dL). Liver Function Tests: Recent Labs  Lab 03/11/23 2013 03/12/23 1206 03/12/23 1812 03/13/23 0319 03/14/23 0333  AST 18 25 20 19 15   ALT 20 21 23 19 21   ALKPHOS 37* 40 35* 32* 34*  BILITOT 1.1 1.3* 1.3* 1.2 0.8  PROT 7.4 8.3* 7.6 6.6 6.8  ALBUMIN 4.3 4.8 4.5 3.6 3.7    Recent Labs  Lab 03/11/23 2013 03/12/23 1131  LIPASE 18 16    No results for input(s): "AMMONIA" in the last 168 hours. Coagulation Profile: No results for input(s): "INR", "PROTIME" in the last 168 hours. Cardiac Enzymes: No results for input(s): "CKTOTAL", "CKMB", "CKMBINDEX", "TROPONINI" in the last 168 hours. BNP (last 3 results) No results for input(s): "PROBNP" in the last 8760 hours. HbA1C: No results for input(s): "HGBA1C" in the last 72 hours. CBG: No results for input(s): "GLUCAP" in the last 168 hours. Lipid Profile: No results for input(s): "CHOL", "HDL", "LDLCALC", "TRIG", "CHOLHDL", "LDLDIRECT" in the last 72 hours. Thyroid Function Tests: Recent Labs    03/12/23 1812  TSH 0.715    Anemia Panel: No results for input(s): "VITAMINB12", "FOLATE", "FERRITIN", "TIBC", "IRON", "RETICCTPCT" in the last 72 hours. Sepsis Labs: No results for input(s): "PROCALCITON",  "LATICACIDVEN" in the last 168 hours.  No results found for this or any previous visit (from the past 240 hour(s)).       Radiology Studies: NM Hepato W/EF  Result Date: 03/13/2023 CLINICAL DATA:  Postprandial abdominal pain and nausea and vomiting. Previous sleeve gastrectomy.  EXAM: NUCLEAR MEDICINE HEPATOBILIARY IMAGING WITH GALLBLADDER EF TECHNIQUE: Sequential images of the abdomen were obtained out to 60 minutes following intravenous administration of radiopharmaceutical. After oral ingestion of Ensure, gallbladder ejection fraction was determined. At 60 min, normal ejection fraction is greater than 33%. RADIOPHARMACEUTICALS:  5.2 mCi Tc-33m  Choletec IV COMPARISON:  None Available. FINDINGS: Prompt uptake and biliary excretion of activity by the liver is seen. Gallbladder activity is visualized, consistent with patency of cystic duct. Biliary activity passes into small bowel, consistent with patent common bile duct. Calculated gallbladder ejection fraction is 28%. (Normal gallbladder ejection fraction with Ensure is greater than 33%.) IMPRESSION: Normal hepatobiliary scan, demonstrating patency of both the cystic and common bile ducts. Low gallbladder ejection fraction of 28%. Electronically Signed   By: Marlaine Hind M.D.   On: 03/13/2023 15:33   DG UGI W SINGLE CM (SOL OR THIN BA)  Result Date: 03/13/2023 CLINICAL DATA:  51 year old male with history of gastric sleeve creation in 2014 admitted with epigastric abdominal pain for 2 days after ingesting a hot dog. EXAM: DG UGI W SINGLE CM TECHNIQUE: Single contrast examination was performed using thin liquid barium. This exam was performed by Brynda Greathouse PA-C, and was supervised and interpreted by Richardean Sale, MD. FLUOROSCOPY: Radiation Exposure Index (as provided by the fluoroscopic device): 61.1 mGy Kerma COMPARISON:  CT Chest, Abdomen and pelvis 03/11/23 FINDINGS: Esophagus: Normal appearance. Esophageal motility: No evidence of esophageal  mass, stricture or ulceration. No evidence of impacted foreign body. Gastroesophageal reflux: None visualized. Ingested 69mm barium tablet: Passed normally into the stomach. Stomach: Expected appearance status post gastric sleeve resection. No mucosal ulceration or leak. Gastric emptying: Normal. Duodenum: Normal appearance. Other:  None. IMPRESSION: No significant findings or explanation for the patient's symptoms. No complication of previous gastric sleeve surgery identified. Electronically Signed   By: Richardean Sale M.D.   On: 03/13/2023 15:32   ECHOCARDIOGRAM COMPLETE  Result Date: 03/13/2023    ECHOCARDIOGRAM REPORT   Patient Name:   ROGER KETTLES Date of Exam: 03/13/2023 Medical Rec #:  [id number]           Height:       74.5 in Accession #:    [id number]          Weight:       273.8 lb Date of Birth:  [birth year]          BSA:          2.495 m Patient Age:    1 years            BP:           136/83 mmHg Patient Gender: M                   HR:           65 bpm. Exam Location:  Inpatient Procedure: 2D Echo, Color Doppler, Cardiac Doppler and Intracardiac            Opacification Agent Indications:    CHF - Acute Systolic  History:        Patient has no prior history of Echocardiogram examinations.                 Risk Factors:Sleep Apnea, Obesity and Hypertension.  Sonographer:    Eartha Inch Referring Phys: [id number] [place] Comments: Image acquisition challenging due to patient body habitus and Image acquisition challenging due to respiratory motion. IMPRESSIONS  1.  Left ventricular ejection fraction, by estimation, is 55 to 60%. The left ventricle has normal function. The left ventricle has no regional wall motion abnormalities. Left ventricular diastolic parameters were normal.  2. Right ventricular systolic function is normal. The right ventricular size is normal. Tricuspid regurgitation signal is inadequate for assessing PA pressure.  3. The mitral valve is grossly normal.  Trivial mitral valve regurgitation. No evidence of mitral stenosis.  4. The aortic valve is tricuspid. Aortic valve regurgitation is not visualized. No aortic stenosis is present.  5. The inferior vena cava is normal in size with greater than 50% respiratory variability, suggesting right atrial pressure of 3 mmHg. FINDINGS  Left Ventricle: Left ventricular ejection fraction, by estimation, is 55 to 60%. The left ventricle has normal function. The left ventricle has no regional wall motion abnormalities. Definity contrast agent was given IV to delineate the left ventricular  endocardial borders. The left ventricular internal cavity size was normal in size. There is no left ventricular hypertrophy. Left ventricular diastolic parameters were normal. Right Ventricle: The right ventricular size is normal. No increase in right ventricular wall thickness. Right ventricular systolic function is normal. Tricuspid regurgitation signal is inadequate for assessing PA pressure. Left Atrium: Left atrial size was normal in size. Right Atrium: Right atrial size was normal in size. Pericardium: There is no evidence of pericardial effusion. Mitral Valve: The mitral valve is grossly normal. Trivial mitral valve regurgitation. No evidence of mitral valve stenosis. Tricuspid Valve: The tricuspid valve is grossly normal. Tricuspid valve regurgitation is trivial. No evidence of tricuspid stenosis. Aortic Valve: The aortic valve is tricuspid. Aortic valve regurgitation is not visualized. No aortic stenosis is present. Pulmonic Valve: The pulmonic valve was grossly normal. Pulmonic valve regurgitation is trivial. No evidence of pulmonic stenosis. Aorta: The aortic root and ascending aorta are structurally normal, with no evidence of dilitation. Venous: The inferior vena cava is normal in size with greater than 50% respiratory variability, suggesting right atrial pressure of 3 mmHg. IAS/Shunts: The atrial septum is grossly normal.  LEFT  VENTRICLE PLAX 2D LVIDd:         4.30 cm      Diastology LVIDs:         3.00 cm      LV e' medial:    8.16 cm/s LV PW:         1.00 cm      LV E/e' medial:  10.2 LV IVS:        1.00 cm      LV e' lateral:   6.96 cm/s LVOT diam:     2.50 cm      LV E/e' lateral: 12.0 LV SV:         92 LV SV Index:   37 LVOT Area:     4.91 cm  LV Volumes (MOD) LV vol d, MOD A2C: 82.8 ml LV vol d, MOD A4C: 153.0 ml LV vol s, MOD A2C: 36.0 ml LV vol s, MOD A4C: 61.6 ml LV SV MOD A2C:     46.8 ml LV SV MOD A4C:     153.0 ml LV SV MOD BP:      65.3 ml RIGHT VENTRICLE             IVC RV S prime:     11.50 cm/s  IVC diam: 2.00 cm TAPSE (M-mode): 2.3 cm LEFT ATRIUM             Index  RIGHT ATRIUM           Index LA diam:        3.20 cm 1.28 cm/m   RA Area:     12.30 cm LA Vol (A2C):   47.5 ml 19.04 ml/m  RA Volume:   23.80 ml  9.54 ml/m LA Vol (A4C):   46.5 ml 18.63 ml/m LA Biplane Vol: 47.3 ml 18.96 ml/m  AORTIC VALVE LVOT Vmax:   83.60 cm/s LVOT Vmean:  60.200 cm/s LVOT VTI:    0.188 m  AORTA Ao Root diam: 3.70 cm Ao Asc diam:  3.40 cm MITRAL VALVE MV Area (PHT): 3.05 cm    SHUNTS MV Decel Time: 249 msec    Systemic VTI:  0.19 m MV E velocity: 83.20 cm/s  Systemic Diam: 2.50 cm MV A velocity: 64.30 cm/s MV E/A ratio:  1.29 Eleonore Chiquito MD Electronically signed by Eleonore Chiquito MD Signature Date/Time: 03/13/2023/9:19:09 AM    Final    DG Chest 2 View  Result Date: 03/12/2023 CLINICAL DATA:  Chest pain. EXAM: CHEST - 2 VIEW COMPARISON:  Chest CTA yesterday FINDINGS: The cardiomediastinal contours are normal. The lungs are clear. Pulmonary vasculature is normal. No consolidation, pleural effusion, or pneumothorax. No acute osseous abnormalities are seen. IMPRESSION: Negative radiographs of the chest. Electronically Signed   By: Keith Rake M.D.   On: 03/12/2023 19:52   US Abdomen Limited RUQ (LIVER/GB)  Result Date: 03/12/2023 CLINICAL DATA:  Right upper quadrant pain, nausea and vomiting. EXAM: ULTRASOUND ABDOMEN  LIMITED RIGHT UPPER QUADRANT COMPARISON:  06/06/2013, CT 03/11/2023 and MRI abdomen 11/11/2023 FINDINGS: Gallbladder: No gallstones or wall thickening visualized. No sonographic Murphy sign noted by sonographer. Common bile duct: Not visualized. Liver: Multiple known large hemangiomas. Caudate lobe mass measures 6 x 6.9 x 8.1 cm. Right lobe mass measures 13.1 x 5.4 x 13.1 cm. Portal vein is patent on color Doppler imaging with normal direction of blood flow towards the liver. Other: No free fluid. IMPRESSION: 1. No acute hepatobiliary disease. 2. Multiple known large hemangiomas. Electronically Signed   By: Marin Olp M.D.   On: 03/12/2023 13:20        Scheduled Meds:  aspirin EC  81 mg Oral Daily   heparin  5,000 Units Subcutaneous Q8H   ondansetron  4 mg Intravenous Once   pantoprazole  20 mg Oral Daily   sucralfate  1 g Oral TID WC & HS   triamterene-hydrochlorothiazide  1 tablet Oral Daily   zolpidem  10 mg Oral QHS   Continuous Infusions:  lactated ringers 75 mL/hr at 03/13/23 1830   methocarbamol (ROBAXIN) IV       LOS: 0 days    Time spent:40 min    Lucella Pommier, Geraldo Docker, MD [facility]   If 7PM-7AM, please contact night-coverage 03/14/2023, 10:26 AM

## 2023-03-14 NOTE — TOC CM/SW Note (Signed)
  Transition of Care [facility]) Screening Note   Patient Details  Name: Nicholas Stevens Date of Birth: 12/06/1971   Transition of Care [facility]) CM/SW Contact:    Lennart Pall, LCSW Phone Number: 03/14/2023, 3:14 PM    Transition of Care Department [facility]) has reviewed patient and no TOC needs have been identified at this time. We will continue to monitor patient advancement through interdisciplinary progression rounds. If new patient transition needs arise, please place a TOC consult.

## 2023-03-14 NOTE — Progress Notes (Signed)
RUQ pain  Subjective: No pain today and has been eating full liquids. Has not tried soft diet yet. Wife is at bedside  Objective: Vital signs in last 24 hours: Temp:  [97.9 F (36.6 C)-98.6 F (37 C)] 98 F (36.7 C) (03/19 0626) Pulse Rate:  [58-82] 65 (03/19 0626) Resp:  [16-18] 18 (03/19 0626) BP: (138-144)/(87-98) 138/87 (03/19 0626) SpO2:  [98 %-99 %] 99 % (03/19 0626) Weight:  [124.7 kg] 124.7 kg (03/19 0500) Last BM Date : 03/10/23  Intake/Output from previous day: 03/18 0701 - 03/19 0700 In: 2565.6 [P.O.:750; I.V.:1815.6] Out: 1050 [Urine:1050] Intake/Output this shift: No intake/output data recorded.  General appearance: alert and cooperative GI: normal findings: soft  Lab Results:  Results for orders placed or performed during the hospital encounter of 03/12/23 (from the past 24 hour(s))  CBC with Differential/Platelet     Status: Abnormal   Collection Time: 03/13/23  9:17 AM  Result Value Ref Range   WBC 3.6 (L) 4.0 - 10.5 K/uL   RBC 6.32 (H) 4.22 - 5.81 MIL/uL   Hemoglobin 15.0 13.0 - 17.0 g/dL   HCT 49.6 39.0 - 52.0 %   MCV 78.5 (L) 80.0 - 100.0 fL   MCH 23.7 (L) 26.0 - 34.0 pg   MCHC 30.2 30.0 - 36.0 g/dL   RDW 18.6 (H) 11.5 - 15.5 %   Platelets 241 150 - 400 K/uL   nRBC 0.0 0.0 - 0.2 %   Neutrophils Relative % 49 %   Neutro Abs 1.8 1.7 - 7.7 K/uL   Lymphocytes Relative 39 %   Lymphs Abs 1.4 0.7 - 4.0 K/uL   Monocytes Relative 8 %   Monocytes Absolute 0.3 0.1 - 1.0 K/uL   Eosinophils Relative 3 %   Eosinophils Absolute 0.1 0.0 - 0.5 K/uL   Basophils Relative 1 %   Basophils Absolute 0.0 0.0 - 0.1 K/uL   Immature Granulocytes 0 %   Abs Immature Granulocytes 0.00 0.00 - 0.07 K/uL  Comprehensive metabolic panel     Status: Abnormal   Collection Time: 03/14/23  3:33 AM  Result Value Ref Range   Sodium 137 135 - 145 mmol/L   Potassium 3.7 3.5 - 5.1 mmol/L   Chloride 102 98 - 111 mmol/L   CO2 25 22 - 32 mmol/L   Glucose, Bld 82 70 - 99 mg/dL   BUN  8 6 - 20 mg/dL   Creatinine, Ser 0.94 0.61 - 1.24 mg/dL   Calcium 9.1 8.9 - 10.3 mg/dL   Total Protein 6.8 6.5 - 8.1 g/dL   Albumin 3.7 3.5 - 5.0 g/dL   AST 15 15 - 41 U/L   ALT 21 0 - 44 U/L   Alkaline Phosphatase 34 (L) 38 - 126 U/L   Total Bilirubin 0.8 0.3 - 1.2 mg/dL   GFR, Estimated >60 >60 mL/min   Anion gap 10 5 - 15  Magnesium     Status: None   Collection Time: 03/14/23  3:33 AM  Result Value Ref Range   Magnesium 2.0 1.7 - 2.4 mg/dL  Phosphorus     Status: None   Collection Time: 03/14/23  3:33 AM  Result Value Ref Range   Phosphorus 3.6 2.5 - 4.6 mg/dL  CBC with Differential/Platelet     Status: Abnormal   Collection Time: 03/14/23  3:33 AM  Result Value Ref Range   WBC 3.7 (L) 4.0 - 10.5 K/uL   RBC 6.19 (H) 4.22 - 5.81 MIL/uL   Hemoglobin  15.1 13.0 - 17.0 g/dL   HCT 47.8 39.0 - 52.0 %   MCV 77.2 (L) 80.0 - 100.0 fL   MCH 24.4 (L) 26.0 - 34.0 pg   MCHC 31.6 30.0 - 36.0 g/dL   RDW 18.2 (H) 11.5 - 15.5 %   Platelets 233 150 - 400 K/uL   nRBC 0.0 0.0 - 0.2 %   Neutrophils Relative % 42 %   Neutro Abs 1.5 (L) 1.7 - 7.7 K/uL   Lymphocytes Relative 46 %   Lymphs Abs 1.7 0.7 - 4.0 K/uL   Monocytes Relative 9 %   Monocytes Absolute 0.4 0.1 - 1.0 K/uL   Eosinophils Relative 3 %   Eosinophils Absolute 0.1 0.0 - 0.5 K/uL   Basophils Relative 0 %   Basophils Absolute 0.0 0.0 - 0.1 K/uL   Immature Granulocytes 0 %   Abs Immature Granulocytes 0.01 0.00 - 0.07 K/uL     Studies/Results Radiology     MEDS, Scheduled  aspirin EC  81 mg Oral Daily   heparin  5,000 Units Subcutaneous Q8H   ondansetron  4 mg Intravenous Once   pantoprazole  20 mg Oral Daily   potassium chloride  40 mEq Oral BID   sucralfate  1 g Oral TID WC & HS   triamterene-hydrochlorothiazide  1 tablet Oral Daily   zolpidem  10 mg Oral QHS     Assessment: RUQ pain   Plan: - Labs overall WNL. UGI negative and HIDA overall WNL - EF minimally low but would not recommend surgical  intervention at this time given acuity of symptoms and current improvement.  - Soft diet today and if tolerating could discharge today from surgical standpoint - Recommend GI referral/follow up outpatient for further work up   LOS: 0 days   I reviewed last 24 h vitals and pain scores, last 48 h intake and output, last 24 h labs and trends, and last 24 h imaging results.  This care required low level of medical decision making.   Winferd Humphrey, [facility] 03/14/2023, 8:53 AM Please see Amion for pager number during day hours 7:00am-4:30pm    03/14/2023 8:27 AM

## 2023-03-14 NOTE — Consult Note (Addendum)
Consultation  Referring Provider: [facility]/ Sherral Hammers Primary Care Physician:  Cassandria Anger, MD Primary Gastroenterologist:  Dr.Pyrtle  Reason for Consultation: Severe abdominal pain, x 4 days, nausea and vomiting   HPI: Nicholas Stevens is a 52 y.o. male, known to Dr. Hilarie Fredrickson from screening colonoscopy that had been done in May 2023.  This was done at the hospital due to concern for difficult intubation.  He was found to have 3 colon polyps largest 6 mm and sigmoid diverticulosis.  Path consistent with 2 tubular adenomas. Patient has history of morbid obesity, sleep apnea, hypertension, and herniated lumbar disc.  He underwent bariatric surgery 3 with Dr. Hassell Done in 2014 with gastric sleeve.  He has done very well since and has not had any problems with the gastric sleeve. He did have an MRI in 2014 after surgery that showed multiple lesions scattered throughout the liver parenchyma with features consistent with cavernous hemangioma the largest lesion was a giant hemangioma essentially replacing much of the right hepatic lobe and generating compression and mass effect on the right portal vein this lesion had progressed in size in the interval since the prior study.  It was a second progressive lesion replacing and expanding the caudate lobe also consistent with a benign cavernous hemangioma and despite proximity to the inferior vena cava and porta hepatis was not exerting any substantial mass effect.  There were other scattered smaller lesions noted as well consistent with hemangiomata.  Patient says he had onset of what he describes as sharp constant right upper quadrant pain last Thursday which was constant after onset and at times with radiation more towards the epigastrium and into his back.  He had associated nausea and episodes of vomiting.  He says he would feel a little bit better after vomiting and then the pain would return.  Fever or chills, no diarrhea.  No hematemesis. He presented  to the emergency room on 03/11/2023-CT angio of the chest abdomen and pelvis which again showed the multiple large attic hemangiomas, no gallstones were noted no gallbladder wall thickening or biliary ductal dilation.  Sleeve gastrectomy noted and diverticulosis. His pain resolved after analgesics and he was allowed discharge to home.  Unfortunately the pain returned the following day despite just being on very small amounts of clear liquids.  He was initially given fentanyl which did not help and then required Dilaudid and then finally his pain settled down.  Since then he has not had any significant recurrence of pain and has been tolerating very small amounts of food without nausea or vomiting.  Has been evaluated by surgery, and underwent HIDA scan which showed a patent cystic duct but EF of 28%. Upper GI was done yesterday and unremarkable with normal emptying.  Labs on 03/11/2022 showed T. bili of 1.3 liver enzymes otherwise unremarkable WBC 5.2/hemoglobin 15.9/hematocrit 49.5/MCV of 78  Today's labs with normal LFTs WBC 3.7/hemoglobin 15/hematocrit 47.8/MCV 77  No regular  EtOH use, no aspirin or NSAID use at home, no regular PPI. Has been on Wegovy recently    Past Medical History:  Diagnosis Date   DDD (degenerative disc disease), lumbar    HNP (herniated nucleus pulposus)    Hypertension    Liver hemangioma    Morbid obesity (HCC)    OSA (obstructive sleep apnea)    no cpap used since weight loss   Overweight(278.02)    Plantar fasciitis of right foot    Sleep apnea    Tinea barbae  Past Surgical History:  Procedure Laterality Date   BREATH TEK H PYLORI N/A 08/20/2013   Procedure: BREATH TEK H PYLORI;  Surgeon: Pedro Earls, MD;  Location: [facility];  Service: General;  Laterality: N/A;   COLONOSCOPY WITH PROPOFOL N/A 05/05/2022   Procedure: COLONOSCOPY WITH PROPOFOL;  Surgeon: Jerene Bears, MD;  Location: [facility];  Service: Gastroenterology;  Laterality:  N/A;   LAPAROSCOPIC APPENDECTOMY  2007   Dr Marlou Starks   LAPAROSCOPIC GASTRIC SLEEVE RESECTION N/A 10/08/2013   Procedure: LAPAROSCOPIC SLEEVE GASTRECTOMY;  Surgeon: Pedro Earls, MD;  Location: [facility];  Service: General;  Laterality: N/A;   LUMBAR LAMINECTOMY/DECOMPRESSION MICRODISCECTOMY N/A 09/16/2015   Procedure: MICRO LUMBAR DECOMPRESSION, MICRODISCECTOMY L5 - S1;  Surgeon: Susa Day, MD;  Location: [facility];  Service: Orthopedics;  Laterality: N/A;   POLYPECTOMY  05/05/2022   Procedure: POLYPECTOMY;  Surgeon: Jerene Bears, MD;  Location: [facility];  Service: Gastroenterology;;   ROTATOR CUFF REPAIR Right 2005   Dr Sharol Given    Prior to Admission medications   Medication Sig Start Date End Date Taking? Authorizing Provider  anastrozole (ARIMIDEX) 1 MG tablet Take 1 mg by mouth once a week. 02/10/22  Yes [provider]  Cholecalciferol (VITAMIN D3) 2000 units capsule Take 1 capsule (2,000 Units total) daily by mouth. 11/03/17  Yes Plotnikov, Evie Lacks, MD  Cyanocobalamin (B-12 PO) Take 1 tablet by mouth daily.   Yes [provider]  Ginger, Zingiber officinalis, (GINGER PO) Take 1 capsule by mouth daily.   Yes [provider]  latanoprost (XALATAN) 0.005 % ophthalmic solution Place 1 drop into both eyes at bedtime.   Yes [provider]  Multiple Vitamin (MULTIVITAMIN WITH MINERALS) TABS tablet Take 1 tablet by mouth daily.   Yes [provider]  Omega-3 Fatty Acids (FISH OIL) 1000 MG CAPS Take 1,000 mg by mouth daily.   Yes [provider]  ondansetron (ZOFRAN) 4 MG tablet Take 1 tablet (4 mg total) by mouth every 6 (six) hours. Patient taking differently: Take 4 mg by mouth every 8 (eight) hours as needed for nausea or vomiting. 03/11/23  Yes Curatolo, Adam, DO  Semaglutide-Weight Management (WEGOVY) 1 MG/0.5ML SOAJ Inject 1 mg into the skin once a week. 01/19/23  Yes Plotnikov, Evie Lacks, MD  sucralfate (CARAFATE) 1 g tablet Take 1  tablet (1 g total) by mouth 4 (four) times daily -  with meals and at bedtime for 14 days. 03/11/23 03/25/23 Yes Curatolo, Adam, DO  tadalafil (CIALIS) 20 MG tablet TAKE 1 TABLET BY MOUTH DAILY AS NEEDED FOR ERECTILE DYSFUNCTION Patient taking differently: Take 10 mg by mouth daily as needed for erectile dysfunction. 09/18/21  Yes Plotnikov, Evie Lacks, MD  triamterene-hydrochlorothiazide (DYAZIDE) 37.5-25 MG capsule TAKE 1 CAPSULE BY MOUTH DAILY Patient taking differently: Take 1 capsule by mouth daily. 11/11/22  Yes Plotnikov, Evie Lacks, MD  TURMERIC PO Take 1 capsule by mouth daily.   Yes [provider]  zolpidem (AMBIEN) 10 MG tablet TAKE 1 TABLET BY MOUTH EVERYDAY AT BEDTIME Patient taking differently: Take 10 mg by mouth at bedtime. 01/30/23  Yes Plotnikov, Evie Lacks, MD  cefdinir (OMNICEF) 300 MG capsule Take 1 capsule (300 mg total) by mouth 2 (two) times daily. Patient not taking: Reported on 03/12/2023 01/19/23   Plotnikov, Evie Lacks, MD  oxyCODONE (ROXICODONE) 5 MG immediate release tablet Take 1 tablet (5 mg total) by mouth every 6 (six) hours as needed for up to 10 doses  for breakthrough pain. Patient not taking: Reported on 03/12/2023 03/11/23   Lennice Sites, DO  pantoprazole (PROTONIX) 20 MG tablet Take 1 tablet (20 mg total) by mouth daily for 14 days. Patient not taking: Reported on 03/12/2023 03/11/23 03/25/23  Lennice Sites, DO    Current Facility-Administered Medications  Medication Dose Route Frequency Provider Last Rate Last Admin   acetaminophen (TYLENOL) tablet 650 mg  650 mg Oral Q6H PRN Allie Bossier, MD       Or   acetaminophen (TYLENOL) suppository 650 mg  650 mg Rectal Q6H PRN Allie Bossier, MD       aspirin EC tablet 81 mg  81 mg Oral Daily Allie Bossier, MD   81 mg at 03/14/23 [id number]   bisacodyl (DULCOLAX) EC tablet 5 mg  5 mg Oral Daily PRN Allie Bossier, MD       heparin injection 5,000 Units  5,000 Units Subcutaneous Q8H Allie Bossier, MD   5,000 Units  at 03/14/23 1437   lactated ringers infusion   Intravenous Continuous Allie Bossier, MD 75 mL/hr at 03/13/23 1830 Rate Change at 03/13/23 1830   methocarbamol (ROBAXIN) 500 mg in dextrose 5 % 50 mL IVPB  500 mg Intravenous Q6H PRN Allie Bossier, MD       morphine (PF) 2 MG/ML injection 2 mg  2 mg Intravenous Q4H PRN Allie Bossier, MD       ondansetron [facility]) injection 4 mg  4 mg Intravenous Once Lacretia Leigh, MD       ondansetron [facility]) tablet 4 mg  4 mg Oral Q6H PRN Allie Bossier, MD       Or   ondansetron [facility]) injection 4 mg  4 mg Intravenous Q6H PRN Allie Bossier, MD       oxyCODONE (Oxy IR/ROXICODONE) immediate release tablet 5 mg  5 mg Oral Q4H PRN Allie Bossier, MD       pantoprazole (PROTONIX) EC tablet 20 mg  20 mg Oral Daily Allie Bossier, MD   20 mg at 03/14/23 [id number]   polyethylene glycol (MIRALAX / GLYCOLAX) packet 17 g  17 g Oral Daily PRN Allie Bossier, MD   17 g at 03/14/23 [id number]   sucralfate (CARAFATE) tablet 1 g  1 g Oral TID WC & HS Allie Bossier, MD   1 g at 03/14/23 1140   triamterene-hydrochlorothiazide (MAXZIDE-25) 37.5-25 MG per tablet 1 tablet  1 tablet Oral Daily Allie Bossier, MD   1 tablet at 03/14/23 [id number]   zolpidem (AMBIEN) tablet 10 mg  10 mg Oral QHS Allie Bossier, MD   10 mg at 03/13/23 2106   zolpidem (AMBIEN) tablet 5 mg  5 mg Oral QHS PRN,MR X 1 Allie Bossier, MD   5 mg at 03/12/23 2329    Allergies as of 03/12/2023   (No Known Allergies)    Family History  Problem Relation Age of Onset   Other Mother        hx of DJD   Liver disease Father        ETOH, Hep C   Hypertension Father    Colon cancer Neg Hx    Colon polyps Neg Hx    Esophageal cancer Neg Hx    Rectal cancer Neg Hx    Stomach cancer Neg Hx     Social History   Socioeconomic History   Marital status: Married    Spouse name: Saish Weatherwax  Number of children: 3   Years of education: Not on file   Highest education level: Not on file  Occupational  History   Occupation: [employer]    Employer: UNEMPLOYED  Tobacco Use   Smoking status: Never   Smokeless tobacco: Never  Vaping Use   Vaping Use: Never used  Substance and Sexual Activity   Alcohol use: No   Drug use: No   Sexual activity: Not on file  Other Topics Concern   Not on file  Social History Narrative   Married 18+ years   3 children - ages approx 87, 32 and 5...daughter age 24 w/ DM type 1   Social Determinants of Health   Financial Resource Strain: Not on file  Food Insecurity: Not on file  Transportation Needs: Not on file  Physical Activity: Not on file  Stress: Not on file  Social Connections: Not on file  Intimate Partner Violence: Not on file    Review of Systems: Pertinent positive and negative review of systems were noted in the above HPI section.  All other review of systems was otherwise negative.  Physical Exam: Vital signs in last 24 hours: Temp:  [98 F (36.7 C)-98.4 F (36.9 C)] 98 F (36.7 C) (03/19 1408) Pulse Rate:  [65-69] 69 (03/19 1408) Resp:  [16-18] 16 (03/19 1408) BP: (138-141)/(87-99) 141/99 (03/19 1408) SpO2:  [97 %-99 %] 97 % (03/19 1408) Weight:  [124.7 kg] 124.7 kg (03/19 0500) Last BM Date : 03/10/23 General:   Alert,  Well-developed, well-nourished, older African-American male pleasant and cooperative in NAD, wife at bedside Head:  Normocephalic and atraumatic. Eyes:  Sclera clear, no icterus.   Conjunctiva pink. Ears:  Normal auditory acuity. Nose:  No deformity, discharge,  or lesions. Mouth:  No deformity or lesions.   Neck:  Supple; no masses or thyromegaly. Lungs:  Clear throughout to auscultation.   No wheezes, crackles, or rhonchi.  Heart:  Regular rate and rhythm; no murmurs, clicks, rubs,  or gallops. Abdomen:  Soft, mild tenderness in the right upper quadrant, guarding or rebound BS active,nonpalp mass or hsm.   Rectal:  not done Msk:  Symmetrical without gross deformities. . Pulses:  Normal pulses  noted. Extremities:  Without clubbing or edema. Neurologic:  Alert and  oriented x4;  grossly normal neurologically. Skin:  Intact without significant lesions or rashes.. Psych:  Alert and cooperative. Normal mood and affect.  Intake/Output from previous day: 03/18 0701 - 03/19 0700 In: 2565.6 [P.O.:750; I.V.:1815.6] Out: 1050 [Urine:1050] Intake/Output this shift: Total I/O In: 928.7 [P.O.:600; I.V.:328.7] Out: -   Lab Results: Recent Labs    03/12/23 1812 03/13/23 0917 03/14/23 0333  WBC 5.2 3.6* 3.7*  HGB 15.3 15.0 15.1  HCT 49.5 49.6 47.8  PLT 241 241 233   BMET Recent Labs    03/12/23 1812 03/13/23 0319 03/14/23 0333  NA 135 132* 137  K 3.7 3.4* 3.7  CL 101 102 102  CO2 27 24 25   GLUCOSE 88 84 82  BUN 9 8 8   CREATININE 1.07 0.92 0.94  CALCIUM 9.1 8.7* 9.1   LFT Recent Labs    03/12/23 1206 03/12/23 1812 03/14/23 0333  PROT 8.3*   < > 6.8  ALBUMIN 4.8   < > 3.7  AST 25   < > 15  ALT 21   < > 21  ALKPHOS 40   < > 34*  BILITOT 1.3*   < > 0.8  BILIDIR 0.4*  --   --  IBILI 0.9  --   --    < > = values in this interval not displayed.     IMPRESSION:  #79 52 year old African-American male admitted after 3-day history of acute right upper quadrant pain described as constant, sharp with some radiation to the epigastrium and at times into the back, associated with nausea and vomiting, fever or chills. He is status post gastric sleeve 2014  Had a fairly extensive workup with upper abdominal ultrasound, CT angio of the chest abdomen and pelvis HIDA scan, and upper GI.  None of these have been positive for a definite source for his pain. Fortunately pain has for the most part resolved at present  Need to consider ulcer disease, possible anastomotic ulceration not seen on other imaging studies. Most pertinent thing on imaging is that of huge hepatic hemangiomas occupying much of the caudate lobe and right lobe of the liver.  These were very large when  initially noted on MRI in 2014.  Size not calculated on current CT, but looks stable on ultrasound Certainly these are a known cause for abdominal pain, wonder if he had had some mild bleeding into 1 of these lesions or around one of the lesions though not seen on CT.  Also wonder if could be causing compression of surrounding vascular structures.  #2 hypertension 3.  Sleep apnea 4.  History of adenomatous colon polyps 5.  Obesity, on Wegovy 6.  Mild microcytic indices, normal hemoglobin-will check iron studies  Plan-will discuss with Dr. Carlean Purl, I think he should have MRI of the liver Will also schedule for upper endoscopy with Dr. Carlean Purl for tomorrow afternoon. N.p.o. after midnight. Further recommendations, pending results of EGD tomorrow.    Amy Esterwood PA-C 03/14/2023, 4:42 PM     [facility] Attending   I have taken an interval history, reviewed the chart and examined the patient. I agree with the Advanced Practitioner's note, impression and recommendations with the following additions:  Etiology of his pain is not clear but it is related to eating so I do not think it is the hemangiomas and I do not think he needs an MRI at this point.  He needs an EGD to evaluate further.  He has microcytic indices I am suspicious he could have iron deficiency which could be nutritional/malabsorption after sleeve surgery.  Colonoscopy was recent so do not suspect any issues there.  He is on semaglutide but does not have any biochemical features to suggest that that is the problem but keep that in mind.  He has a special situation and that he has a gastric sleeve and the delayed emptying from semaglutide could be involved I suppose.  If the EGD fails to reveal a cause then consider MRI for more accurate measurement and recharacterization of hemangiomas though it would have to be true true and unrelated that the pain began with ingestion of a hot dog.   Gatha Mayer, MD, [facility] See Shea Evans on call - gastroenterology for best contact person 03/14/2023 6:06 PM

## 2023-03-14 NOTE — Plan of Care (Signed)

## 2023-03-14 NOTE — H&P (View-Only) (Signed)
Consultation  Referring Provider: [facility]/ Sherral Hammers Primary Care Physician:  Cassandria Anger, MD Primary Gastroenterologist:  Dr.Pyrtle  Reason for Consultation: Severe abdominal pain, x 4 days, nausea and vomiting   HPI: Nicholas Stevens is a 52 y.o. male, known to Dr. Hilarie Fredrickson from screening colonoscopy that had been done in May 2023.  This was done at the hospital due to concern for difficult intubation.  He was found to have 3 colon polyps largest 6 mm and sigmoid diverticulosis.  Path consistent with 2 tubular adenomas. Patient has history of morbid obesity, sleep apnea, hypertension, and herniated lumbar disc.  He underwent bariatric surgery 3 with Dr. Hassell Done in 2014 with gastric sleeve.  He has done very well since and has not had any problems with the gastric sleeve. He did have an MRI in 2014 after surgery that showed multiple lesions scattered throughout the liver parenchyma with features consistent with cavernous hemangioma the largest lesion was a giant hemangioma essentially replacing much of the right hepatic lobe and generating compression and mass effect on the right portal vein this lesion had progressed in size in the interval since the prior study.  It was a second progressive lesion replacing and expanding the caudate lobe also consistent with a benign cavernous hemangioma and despite proximity to the inferior vena cava and porta hepatis was not exerting any substantial mass effect.  There were other scattered smaller lesions noted as well consistent with hemangiomata.  Patient says he had onset of what he describes as sharp constant right upper quadrant pain last Thursday which was constant after onset and at times with radiation more towards the epigastrium and into his back.  He had associated nausea and episodes of vomiting.  He says he would feel a little bit better after vomiting and then the pain would return.  Fever or chills, no diarrhea.  No hematemesis. He presented  to the emergency room on 03/11/2023-CT angio of the chest abdomen and pelvis which again showed the multiple large attic hemangiomas, no gallstones were noted no gallbladder wall thickening or biliary ductal dilation.  Sleeve gastrectomy noted and diverticulosis. His pain resolved after analgesics and he was allowed discharge to home.  Unfortunately the pain returned the following day despite just being on very small amounts of clear liquids.  He was initially given fentanyl which did not help and then required Dilaudid and then finally his pain settled down.  Since then he has not had any significant recurrence of pain and has been tolerating very small amounts of food without nausea or vomiting.  Has been evaluated by surgery, and underwent HIDA scan which showed a patent cystic duct but EF of 28%. Upper GI was done yesterday and unremarkable with normal emptying.  Labs on 03/11/2022 showed T. bili of 1.3 liver enzymes otherwise unremarkable WBC 5.2/hemoglobin 15.9/hematocrit 49.5/MCV of 78  Today's labs with normal LFTs WBC 3.7/hemoglobin 15/hematocrit 47.8/MCV 77  No regular  EtOH use, no aspirin or NSAID use at home, no regular PPI. Has been on Wegovy recently    Past Medical History:  Diagnosis Date   DDD (degenerative disc disease), lumbar    HNP (herniated nucleus pulposus)    Hypertension    Liver hemangioma    Morbid obesity (HCC)    OSA (obstructive sleep apnea)    no cpap used since weight loss   Overweight(278.02)    Plantar fasciitis of right foot    Sleep apnea    Tinea barbae  Past Surgical History:  Procedure Laterality Date   BREATH TEK H PYLORI N/A 08/20/2013   Procedure: BREATH TEK H PYLORI;  Surgeon: Pedro Earls, MD;  Location: [facility];  Service: General;  Laterality: N/A;   COLONOSCOPY WITH PROPOFOL N/A 05/05/2022   Procedure: COLONOSCOPY WITH PROPOFOL;  Surgeon: Jerene Bears, MD;  Location: [facility];  Service: Gastroenterology;  Laterality:  N/A;   LAPAROSCOPIC APPENDECTOMY  2007   Dr Marlou Starks   LAPAROSCOPIC GASTRIC SLEEVE RESECTION N/A 10/08/2013   Procedure: LAPAROSCOPIC SLEEVE GASTRECTOMY;  Surgeon: Pedro Earls, MD;  Location: [facility];  Service: General;  Laterality: N/A;   LUMBAR LAMINECTOMY/DECOMPRESSION MICRODISCECTOMY N/A 09/16/2015   Procedure: MICRO LUMBAR DECOMPRESSION, MICRODISCECTOMY L5 - S1;  Surgeon: Susa Day, MD;  Location: [facility];  Service: Orthopedics;  Laterality: N/A;   POLYPECTOMY  05/05/2022   Procedure: POLYPECTOMY;  Surgeon: Jerene Bears, MD;  Location: [facility];  Service: Gastroenterology;;   ROTATOR CUFF REPAIR Right 2005   Dr Sharol Given    Prior to Admission medications   Medication Sig Start Date End Date Taking? Authorizing Provider  anastrozole (ARIMIDEX) 1 MG tablet Take 1 mg by mouth once a week. 02/10/22  Yes [provider]  Cholecalciferol (VITAMIN D3) 2000 units capsule Take 1 capsule (2,000 Units total) daily by mouth. 11/03/17  Yes Plotnikov, Evie Lacks, MD  Cyanocobalamin (B-12 PO) Take 1 tablet by mouth daily.   Yes [provider]  Ginger, Zingiber officinalis, (GINGER PO) Take 1 capsule by mouth daily.   Yes [provider]  latanoprost (XALATAN) 0.005 % ophthalmic solution Place 1 drop into both eyes at bedtime.   Yes [provider]  Multiple Vitamin (MULTIVITAMIN WITH MINERALS) TABS tablet Take 1 tablet by mouth daily.   Yes [provider]  Omega-3 Fatty Acids (FISH OIL) 1000 MG CAPS Take 1,000 mg by mouth daily.   Yes [provider]  ondansetron (ZOFRAN) 4 MG tablet Take 1 tablet (4 mg total) by mouth every 6 (six) hours. Patient taking differently: Take 4 mg by mouth every 8 (eight) hours as needed for nausea or vomiting. 03/11/23  Yes Curatolo, Adam, DO  Semaglutide-Weight Management (WEGOVY) 1 MG/0.5ML SOAJ Inject 1 mg into the skin once a week. 01/19/23  Yes Plotnikov, Evie Lacks, MD  sucralfate (CARAFATE) 1 g tablet Take 1  tablet (1 g total) by mouth 4 (four) times daily -  with meals and at bedtime for 14 days. 03/11/23 03/25/23 Yes Curatolo, Adam, DO  tadalafil (CIALIS) 20 MG tablet TAKE 1 TABLET BY MOUTH DAILY AS NEEDED FOR ERECTILE DYSFUNCTION Patient taking differently: Take 10 mg by mouth daily as needed for erectile dysfunction. 09/18/21  Yes Plotnikov, Evie Lacks, MD  triamterene-hydrochlorothiazide (DYAZIDE) 37.5-25 MG capsule TAKE 1 CAPSULE BY MOUTH DAILY Patient taking differently: Take 1 capsule by mouth daily. 11/11/22  Yes Plotnikov, Evie Lacks, MD  TURMERIC PO Take 1 capsule by mouth daily.   Yes [provider]  zolpidem (AMBIEN) 10 MG tablet TAKE 1 TABLET BY MOUTH EVERYDAY AT BEDTIME Patient taking differently: Take 10 mg by mouth at bedtime. 01/30/23  Yes Plotnikov, Evie Lacks, MD  cefdinir (OMNICEF) 300 MG capsule Take 1 capsule (300 mg total) by mouth 2 (two) times daily. Patient not taking: Reported on 03/12/2023 01/19/23   Plotnikov, Evie Lacks, MD  oxyCODONE (ROXICODONE) 5 MG immediate release tablet Take 1 tablet (5 mg total) by mouth every 6 (six) hours as needed for up to 10 doses  for breakthrough pain. Patient not taking: Reported on 03/12/2023 03/11/23   Lennice Sites, DO  pantoprazole (PROTONIX) 20 MG tablet Take 1 tablet (20 mg total) by mouth daily for 14 days. Patient not taking: Reported on 03/12/2023 03/11/23 03/25/23  Lennice Sites, DO    Current Facility-Administered Medications  Medication Dose Route Frequency Provider Last Rate Last Admin   acetaminophen (TYLENOL) tablet 650 mg  650 mg Oral Q6H PRN Allie Bossier, MD       Or   acetaminophen (TYLENOL) suppository 650 mg  650 mg Rectal Q6H PRN Allie Bossier, MD       aspirin EC tablet 81 mg  81 mg Oral Daily Allie Bossier, MD   81 mg at 03/14/23 [id number]   bisacodyl (DULCOLAX) EC tablet 5 mg  5 mg Oral Daily PRN Allie Bossier, MD       heparin injection 5,000 Units  5,000 Units Subcutaneous Q8H Allie Bossier, MD   5,000 Units  at 03/14/23 1437   lactated ringers infusion   Intravenous Continuous Allie Bossier, MD 75 mL/hr at 03/13/23 1830 Rate Change at 03/13/23 1830   methocarbamol (ROBAXIN) 500 mg in dextrose 5 % 50 mL IVPB  500 mg Intravenous Q6H PRN Allie Bossier, MD       morphine (PF) 2 MG/ML injection 2 mg  2 mg Intravenous Q4H PRN Allie Bossier, MD       ondansetron [facility]) injection 4 mg  4 mg Intravenous Once Lacretia Leigh, MD       ondansetron [facility]) tablet 4 mg  4 mg Oral Q6H PRN Allie Bossier, MD       Or   ondansetron [facility]) injection 4 mg  4 mg Intravenous Q6H PRN Allie Bossier, MD       oxyCODONE (Oxy IR/ROXICODONE) immediate release tablet 5 mg  5 mg Oral Q4H PRN Allie Bossier, MD       pantoprazole (PROTONIX) EC tablet 20 mg  20 mg Oral Daily Allie Bossier, MD   20 mg at 03/14/23 [id number]   polyethylene glycol (MIRALAX / GLYCOLAX) packet 17 g  17 g Oral Daily PRN Allie Bossier, MD   17 g at 03/14/23 [id number]   sucralfate (CARAFATE) tablet 1 g  1 g Oral TID WC & HS Allie Bossier, MD   1 g at 03/14/23 1140   triamterene-hydrochlorothiazide (MAXZIDE-25) 37.5-25 MG per tablet 1 tablet  1 tablet Oral Daily Allie Bossier, MD   1 tablet at 03/14/23 [id number]   zolpidem (AMBIEN) tablet 10 mg  10 mg Oral QHS Allie Bossier, MD   10 mg at 03/13/23 2106   zolpidem (AMBIEN) tablet 5 mg  5 mg Oral QHS PRN,MR X 1 Allie Bossier, MD   5 mg at 03/12/23 2329    Allergies as of 03/12/2023   (No Known Allergies)    Family History  Problem Relation Age of Onset   Other Mother        hx of DJD   Liver disease Father        ETOH, Hep C   Hypertension Father    Colon cancer Neg Hx    Colon polyps Neg Hx    Esophageal cancer Neg Hx    Rectal cancer Neg Hx    Stomach cancer Neg Hx     Social History   Socioeconomic History   Marital status: Married    Spouse name: Nicholas Stevens  Number of children: 3   Years of education: Not on file   Highest education level: Not on file  Occupational  History   Occupation: [employer]    Employer: UNEMPLOYED  Tobacco Use   Smoking status: Never   Smokeless tobacco: Never  Vaping Use   Vaping Use: Never used  Substance and Sexual Activity   Alcohol use: No   Drug use: No   Sexual activity: Not on file  Other Topics Concern   Not on file  Social History Narrative   Married 18+ years   3 children - ages approx 57, 30 and 5...daughter age 28 w/ DM type 1   Social Determinants of Health   Financial Resource Strain: Not on file  Food Insecurity: Not on file  Transportation Needs: Not on file  Physical Activity: Not on file  Stress: Not on file  Social Connections: Not on file  Intimate Partner Violence: Not on file    Review of Systems: Pertinent positive and negative review of systems were noted in the above HPI section.  All other review of systems was otherwise negative.  Physical Exam: Vital signs in last 24 hours: Temp:  [98 F (36.7 C)-98.4 F (36.9 C)] 98 F (36.7 C) (03/19 1408) Pulse Rate:  [65-69] 69 (03/19 1408) Resp:  [16-18] 16 (03/19 1408) BP: (138-141)/(87-99) 141/99 (03/19 1408) SpO2:  [97 %-99 %] 97 % (03/19 1408) Weight:  [124.7 kg] 124.7 kg (03/19 0500) Last BM Date : 03/10/23 General:   Alert,  Well-developed, well-nourished, older African-American male pleasant and cooperative in NAD, wife at bedside Head:  Normocephalic and atraumatic. Eyes:  Sclera clear, no icterus.   Conjunctiva pink. Ears:  Normal auditory acuity. Nose:  No deformity, discharge,  or lesions. Mouth:  No deformity or lesions.   Neck:  Supple; no masses or thyromegaly. Lungs:  Clear throughout to auscultation.   No wheezes, crackles, or rhonchi.  Heart:  Regular rate and rhythm; no murmurs, clicks, rubs,  or gallops. Abdomen:  Soft, mild tenderness in the right upper quadrant, guarding or rebound BS active,nonpalp mass or hsm.   Rectal:  not done Msk:  Symmetrical without gross deformities. . Pulses:  Normal pulses  noted. Extremities:  Without clubbing or edema. Neurologic:  Alert and  oriented x4;  grossly normal neurologically. Skin:  Intact without significant lesions or rashes.. Psych:  Alert and cooperative. Normal mood and affect.  Intake/Output from previous day: 03/18 0701 - 03/19 0700 In: 2565.6 [P.O.:750; I.V.:1815.6] Out: 1050 [Urine:1050] Intake/Output this shift: Total I/O In: 928.7 [P.O.:600; I.V.:328.7] Out: -   Lab Results: Recent Labs    03/12/23 1812 03/13/23 0917 03/14/23 0333  WBC 5.2 3.6* 3.7*  HGB 15.3 15.0 15.1  HCT 49.5 49.6 47.8  PLT 241 241 233   BMET Recent Labs    03/12/23 1812 03/13/23 0319 03/14/23 0333  NA 135 132* 137  K 3.7 3.4* 3.7  CL 101 102 102  CO2 27 24 25   GLUCOSE 88 84 82  BUN 9 8 8   CREATININE 1.07 0.92 0.94  CALCIUM 9.1 8.7* 9.1   LFT Recent Labs    03/12/23 1206 03/12/23 1812 03/14/23 0333  PROT 8.3*   < > 6.8  ALBUMIN 4.8   < > 3.7  AST 25   < > 15  ALT 21   < > 21  ALKPHOS 40   < > 34*  BILITOT 1.3*   < > 0.8  BILIDIR 0.4*  --   --  IBILI 0.9  --   --    < > = values in this interval not displayed.     IMPRESSION:  #52 52 year old African-American male admitted after 3-day history of acute right upper quadrant pain described as constant, sharp with some radiation to the epigastrium and at times into the back, associated with nausea and vomiting, fever or chills. He is status post gastric sleeve 2014  Had a fairly extensive workup with upper abdominal ultrasound, CT angio of the chest abdomen and pelvis HIDA scan, and upper GI.  None of these have been positive for a definite source for his pain. Fortunately pain has for the most part resolved at present  Need to consider ulcer disease, possible anastomotic ulceration not seen on other imaging studies. Most pertinent thing on imaging is that of huge hepatic hemangiomas occupying much of the caudate lobe and right lobe of the liver.  These were very large when  initially noted on MRI in 2014.  Size not calculated on current CT, but looks stable on ultrasound Certainly these are a known cause for abdominal pain, wonder if he had had some mild bleeding into 1 of these lesions or around one of the lesions though not seen on CT.  Also wonder if could be causing compression of surrounding vascular structures.  #2 hypertension 3.  Sleep apnea 4.  History of adenomatous colon polyps 5.  Obesity, on Wegovy 6.  Mild microcytic indices, normal hemoglobin-will check iron studies  Plan-will discuss with Dr. Carlean Purl, I think he should have MRI of the liver Will also schedule for upper endoscopy with Dr. Carlean Purl for tomorrow afternoon. N.p.o. after midnight. Further recommendations, pending results of EGD tomorrow.    Amy Esterwood PA-C 03/14/2023, 4:42 PM     [facility] Attending   I have taken an interval history, reviewed the chart and examined the patient. I agree with the Advanced Practitioner's note, impression and recommendations with the following additions:  Etiology of his pain is not clear but it is related to eating so I do not think it is the hemangiomas and I do not think he needs an MRI at this point.  He needs an EGD to evaluate further.  He has microcytic indices I am suspicious he could have iron deficiency which could be nutritional/malabsorption after sleeve surgery.  Colonoscopy was recent so do not suspect any issues there.  He is on semaglutide but does not have any biochemical features to suggest that that is the problem but keep that in mind.  He has a special situation and that he has a gastric sleeve and the delayed emptying from semaglutide could be involved I suppose.  If the EGD fails to reveal a cause then consider MRI for more accurate measurement and recharacterization of hemangiomas though it would have to be true true and unrelated that the pain began with ingestion of a hot dog.   Gatha Mayer, MD, [facility] See Shea Evans on call - gastroenterology for best contact person 03/14/2023 6:06 PM

## 2023-03-15 ENCOUNTER — Inpatient Hospital Stay (HOSPITAL_COMMUNITY): Payer: Commercial Managed Care - PPO | Admitting: Anesthesiology

## 2023-03-15 ENCOUNTER — Encounter (HOSPITAL_COMMUNITY): Payer: Self-pay | Admitting: Internal Medicine

## 2023-03-15 ENCOUNTER — Encounter (HOSPITAL_COMMUNITY)
Admission: EM | Disposition: A | Discharge status: Home / Self Care | Payer: Self-pay | Source: Home / Self Care | Attending: Internal Medicine

## 2023-03-15 DIAGNOSIS — G4733 Obstructive sleep apnea (adult) (pediatric): Secondary | ICD-10-CM | POA: Diagnosis present

## 2023-03-15 DIAGNOSIS — E871 Hypo-osmolality and hyponatremia: Secondary | ICD-10-CM | POA: Diagnosis present

## 2023-03-15 DIAGNOSIS — G4730 Sleep apnea, unspecified: Secondary | ICD-10-CM | POA: Diagnosis not present

## 2023-03-15 DIAGNOSIS — R9431 Abnormal electrocardiogram [ECG] [EKG]: Secondary | ICD-10-CM | POA: Diagnosis present

## 2023-03-15 DIAGNOSIS — K828 Other specified diseases of gallbladder: Secondary | ICD-10-CM | POA: Diagnosis present

## 2023-03-15 DIAGNOSIS — E669 Obesity, unspecified: Secondary | ICD-10-CM | POA: Diagnosis present

## 2023-03-15 DIAGNOSIS — E876 Hypokalemia: Secondary | ICD-10-CM | POA: Diagnosis present

## 2023-03-15 DIAGNOSIS — E291 Testicular hypofunction: Secondary | ICD-10-CM | POA: Diagnosis present

## 2023-03-15 DIAGNOSIS — Z9884 Bariatric surgery status: Secondary | ICD-10-CM | POA: Diagnosis not present

## 2023-03-15 DIAGNOSIS — I10 Essential (primary) hypertension: Secondary | ICD-10-CM | POA: Diagnosis present

## 2023-03-15 DIAGNOSIS — R112 Nausea with vomiting, unspecified: Secondary | ICD-10-CM | POA: Diagnosis present

## 2023-03-15 DIAGNOSIS — R109 Unspecified abdominal pain: Secondary | ICD-10-CM | POA: Diagnosis present

## 2023-03-15 DIAGNOSIS — R1013 Epigastric pain: Secondary | ICD-10-CM

## 2023-03-15 DIAGNOSIS — Z6834 Body mass index (BMI) 34.0-34.9, adult: Secondary | ICD-10-CM | POA: Diagnosis not present

## 2023-03-15 DIAGNOSIS — R1011 Right upper quadrant pain: Secondary | ICD-10-CM | POA: Diagnosis present

## 2023-03-15 DIAGNOSIS — Z7985 Long-term (current) use of injectable non-insulin antidiabetic drugs: Secondary | ICD-10-CM | POA: Diagnosis not present

## 2023-03-15 DIAGNOSIS — Z86010 Personal history of colonic polyps: Secondary | ICD-10-CM | POA: Diagnosis not present

## 2023-03-15 DIAGNOSIS — D1803 Hemangioma of intra-abdominal structures: Secondary | ICD-10-CM | POA: Diagnosis present

## 2023-03-15 DIAGNOSIS — Z79899 Other long term (current) drug therapy: Secondary | ICD-10-CM | POA: Diagnosis not present

## 2023-03-15 DIAGNOSIS — Z903 Acquired absence of stomach [part of]: Secondary | ICD-10-CM | POA: Diagnosis not present

## 2023-03-15 DIAGNOSIS — Z8249 Family history of ischemic heart disease and other diseases of the circulatory system: Secondary | ICD-10-CM | POA: Diagnosis not present

## 2023-03-15 DIAGNOSIS — R079 Chest pain, unspecified: Secondary | ICD-10-CM | POA: Diagnosis present

## 2023-03-15 DIAGNOSIS — Z560 Unemployment, unspecified: Secondary | ICD-10-CM | POA: Diagnosis not present

## 2023-03-15 HISTORY — PX: ESOPHAGOGASTRODUODENOSCOPY (EGD) WITH PROPOFOL: SHX5813

## 2023-03-15 LAB — IRON AND TIBC
Iron: 34 ug/dL — ABNORMAL LOW (ref 45–182)
Saturation Ratios: 8 % — ABNORMAL LOW (ref 17.9–39.5)
TIBC: 434 ug/dL (ref 250–450)
UIBC: 400 ug/dL

## 2023-03-15 LAB — COMPREHENSIVE METABOLIC PANEL
ALT: 20 U/L (ref 0–44)
AST: 16 U/L (ref 15–41)
Albumin: 3.9 g/dL (ref 3.5–5.0)
Alkaline Phosphatase: 33 U/L — ABNORMAL LOW (ref 38–126)
Anion gap: 6 (ref 5–15)
BUN: 8 mg/dL (ref 6–20)
CO2: 29 mmol/L (ref 22–32)
Calcium: 8.7 mg/dL — ABNORMAL LOW (ref 8.9–10.3)
Chloride: 102 mmol/L (ref 98–111)
Creatinine, Ser: 1.15 mg/dL (ref 0.61–1.24)
GFR, Estimated: >60 mL/min (ref >60)
Glucose, Bld: 92 mg/dL (ref 70–99)
Potassium: 3.8 mmol/L (ref 3.5–5.1)
Sodium: 137 mmol/L (ref 135–145)
Total Bilirubin: 0.9 mg/dL (ref 0.3–1.2)
Total Protein: 7 g/dL (ref 6.5–8.1)

## 2023-03-15 LAB — CBC WITH DIFFERENTIAL/PLATELET
Abs Immature Granulocytes: 0.02 10*3/uL (ref 0.00–0.07)
Basophils Absolute: 0 10*3/uL (ref 0.0–0.1)
Basophils Relative: 1 %
Eosinophils Absolute: 0.1 10*3/uL (ref 0.0–0.5)
Eosinophils Relative: 3 %
HCT: 47.2 % (ref 39.0–52.0)
Hemoglobin: 14.7 g/dL (ref 13.0–17.0)
Immature Granulocytes: 1 %
Lymphocytes Relative: 44 %
Lymphs Abs: 1.8 10*3/uL (ref 0.7–4.0)
MCH: 24.2 pg — ABNORMAL LOW (ref 26.0–34.0)
MCHC: 31.1 g/dL (ref 30.0–36.0)
MCV: 77.8 fL — ABNORMAL LOW (ref 80.0–100.0)
Monocytes Absolute: 0.4 10*3/uL (ref 0.1–1.0)
Monocytes Relative: 9 %
Neutro Abs: 1.6 10*3/uL — ABNORMAL LOW (ref 1.7–7.7)
Neutrophils Relative %: 42 %
Platelets: 231 10*3/uL (ref 150–400)
RBC: 6.07 MIL/uL — ABNORMAL HIGH (ref 4.22–5.81)
RDW: 18.1 % — ABNORMAL HIGH (ref 11.5–15.5)
WBC: 3.9 10*3/uL — ABNORMAL LOW (ref 4.0–10.5)
nRBC: 0 % (ref 0.0–0.2)

## 2023-03-15 LAB — MAGNESIUM: Magnesium: 2.1 mg/dL (ref 1.7–2.4)

## 2023-03-15 LAB — PHOSPHORUS: Phosphorus: 3.4 mg/dL (ref 2.5–4.6)

## 2023-03-15 LAB — FERRITIN: Ferritin: 14 ng/mL — ABNORMAL LOW (ref 24–336)

## 2023-03-15 SURGERY — ESOPHAGOGASTRODUODENOSCOPY (EGD) WITH PROPOFOL
Anesthesia: Monitor Anesthesia Care

## 2023-03-15 MED ORDER — PROPOFOL 500 MG/50ML IV EMUL
INTRAVENOUS | Status: DC | PRN
  Administered 2023-03-15: 200 ug/kg/min via INTRAVENOUS

## 2023-03-15 MED ORDER — FERROUS SULFATE 325 (65 FE) MG PO TBEC: 325.0000 mg | DELAYED_RELEASE_TABLET | Freq: Every day | ORAL | 0 refills | Status: DC

## 2023-03-15 MED ORDER — LIDOCAINE 2% (20 MG/ML) 5 ML SYRINGE
INTRAMUSCULAR | Status: DC | PRN
  Administered 2023-03-15: 100 mg via INTRAVENOUS

## 2023-03-15 MED ORDER — PANTOPRAZOLE SODIUM 20 MG PO TBEC: 20.0000 mg | DELAYED_RELEASE_TABLET | Freq: Every day | ORAL | 0 refills | Status: DC

## 2023-03-15 MED ORDER — PROPOFOL 10 MG/ML IV BOLUS
INTRAVENOUS | Status: DC | PRN
  Administered 2023-03-15: 50 mg via INTRAVENOUS

## 2023-03-15 MED ORDER — POLYETHYLENE GLYCOL 3350 17 G PO PACK: 17.0000 g | PACK | Freq: Every day | ORAL | 0 refills | Status: DC | PRN

## 2023-03-15 SURGICAL SUPPLY — 15 items

## 2023-03-15 NOTE — Anesthesia Procedure Notes (Signed)
Procedure Name: MAC Date/Time: 03/15/2023 1:21 PM  Performed by: Renato Shin, CRNAPre-anesthesia Checklist: Patient identified, Emergency Drugs available, Suction available and Patient being monitored Patient Re-evaluated:Patient Re-evaluated prior to induction Oxygen Delivery Method: Nasal cannula Preoxygenation: Pre-oxygenation with 100% oxygen Induction Type: IV induction Placement Confirmation: positive ETCO2 and breath sounds checked- equal and bilateral Dental Injury: Teeth and Oropharynx as per pre-operative assessment

## 2023-03-15 NOTE — Interval H&P Note (Signed)
History and Physical Interval Note:  03/15/2023 12:41 PM  Nicholas Stevens  has presented today for surgery, with the diagnosis of Epigastric/right upper quadrant pain-is post remote gastric sleeve.  The various methods of treatment have been discussed with the patient and family. After consideration of risks, benefits and other options for treatment, the patient has consented to  Procedure(s): ESOPHAGOGASTRODUODENOSCOPY (EGD) WITH PROPOFOL (N/A) as a surgical intervention.  The patient's history has been reviewed, patient examined, no change in status, stable for surgery.  I have reviewed the patient's chart and labs.  Questions were answered to the patient's satisfaction.     Silvano Rusk

## 2023-03-15 NOTE — Progress Notes (Signed)
Progress Note     Subjective: Pt denies abdominal pain, nausea or vomiting. GI planning EGD today. Wife at bedside as well.   Objective: Vital signs in last 24 hours: Temp:  [98 F (36.7 C)-98.7 F (37.1 C)] 98.1 F (36.7 C) (03/20 0529) Pulse Rate:  [69-70] 69 (03/20 0529) Resp:  [16-18] 17 (03/20 0529) BP: (122-141)/(84-99) 122/88 (03/20 0529) SpO2:  [97 %] 97 % (03/20 0529) Last BM Date : 03/10/23  Intake/Output from previous day: 03/19 0701 - 03/20 0700 In: 2468.4 [P.O.:840; I.V.:1628.4] Out: -  Intake/Output this shift: Total I/O In: 265.8 [I.V.:265.8] Out: 1000 [Urine:1000]  PE: General: pleasant, WD, WN male who is sitting up in NAD Lungs:  Respiratory effort nonlabored Abd: soft, NT, ND Psych: A&Ox3 with an appropriate affect.    Lab Results:  Recent Labs    03/14/23 0333 03/15/23 0331  WBC 3.7* 3.9*  HGB 15.1 14.7  HCT 47.8 47.2  PLT 233 231   BMET Recent Labs    03/14/23 0333 03/15/23 0331  NA 137 137  K 3.7 3.8  CL 102 102  CO2 25 29  GLUCOSE 82 92  BUN 8 8  CREATININE 0.94 1.15  CALCIUM 9.1 8.7*   PT/INR No results for input(s): "LABPROT", "INR" in the last 72 hours. CMP     Component Value Date/Time   NA 137 03/15/2023 0331   K 3.8 03/15/2023 0331   CL 102 03/15/2023 0331   CO2 29 03/15/2023 0331   GLUCOSE 92 03/15/2023 0331   BUN 8 03/15/2023 0331   CREATININE 1.15 03/15/2023 0331   CALCIUM 8.7 (L) 03/15/2023 0331   PROT 7.0 03/15/2023 0331   ALBUMIN 3.9 03/15/2023 0331   AST 16 03/15/2023 0331   ALT 20 03/15/2023 0331   ALKPHOS 33 (L) 03/15/2023 0331   BILITOT 0.9 03/15/2023 0331   GFRNONAA >60 03/15/2023 0331   GFRAA >60 09/15/2015 1520   Lipase     Component Value Date/Time   LIPASE 16 03/12/2023 1131       Studies/Results: NM Hepato W/EF  Result Date: 03/13/2023 CLINICAL DATA:  Postprandial abdominal pain and nausea and vomiting. Previous sleeve gastrectomy. EXAM: NUCLEAR MEDICINE HEPATOBILIARY IMAGING  WITH GALLBLADDER EF TECHNIQUE: Sequential images of the abdomen were obtained out to 60 minutes following intravenous administration of radiopharmaceutical. After oral ingestion of Ensure, gallbladder ejection fraction was determined. At 60 min, normal ejection fraction is greater than 33%. RADIOPHARMACEUTICALS:  5.2 mCi Tc-53m  Choletec IV COMPARISON:  None Available. FINDINGS: Prompt uptake and biliary excretion of activity by the liver is seen. Gallbladder activity is visualized, consistent with patency of cystic duct. Biliary activity passes into small bowel, consistent with patent common bile duct. Calculated gallbladder ejection fraction is 28%. (Normal gallbladder ejection fraction with Ensure is greater than 33%.) IMPRESSION: Normal hepatobiliary scan, demonstrating patency of both the cystic and common bile ducts. Low gallbladder ejection fraction of 28%. Electronically Signed   By: Marlaine Hind M.D.   On: 03/13/2023 15:33   DG UGI W SINGLE CM (SOL OR THIN BA)  Result Date: 03/13/2023 CLINICAL DATA:  52 year old male with history of gastric sleeve creation in 2014 admitted with epigastric abdominal pain for 2 days after ingesting a hot dog. EXAM: DG UGI W SINGLE CM TECHNIQUE: Single contrast examination was performed using thin liquid barium. This exam was performed by Brynda Greathouse PA-C, and was supervised and interpreted by Richardean Sale, MD. FLUOROSCOPY: Radiation Exposure Index (as provided by the  fluoroscopic device): 61.1 mGy Kerma COMPARISON:  CT Chest, Abdomen and pelvis 03/11/23 FINDINGS: Esophagus: Normal appearance. Esophageal motility: No evidence of esophageal mass, stricture or ulceration. No evidence of impacted foreign body. Gastroesophageal reflux: None visualized. Ingested 98mm barium tablet: Passed normally into the stomach. Stomach: Expected appearance status post gastric sleeve resection. No mucosal ulceration or leak. Gastric emptying: Normal. Duodenum: Normal appearance. Other:   None. IMPRESSION: No significant findings or explanation for the patient's symptoms. No complication of previous gastric sleeve surgery identified. Electronically Signed   By: Richardean Sale M.D.   On: 03/13/2023 15:32    Anti-infectives: Anti-infectives (From admission, onward)    None        Assessment/Plan  Abdominal pain with nausea and vomiting Hx of gastric sleeve Liver hemangiomas - currently no pain and was tolerating diet  - Labs overall WNL. UGI negative and HIDA overall WNL - EF minimally low but would not recommend surgical intervention at this time given acuity of symptoms and current improvement.  - HIDA is also a dynamic study and so low EF on one study could be suggestive of biliary dyskinesia with appropriate history as well but not definitive - appreciate GI consult and will follow up results of EGD today, agree with ruling out other sources of symptoms prior to considering surgical intervention   FEN: NPO for EGD VTE: SQH ID: no current abx  - per [facility] -  HTN Abnormal EKG - echo 3/18 with EF 55-60% OSA Obesity class I - BMI 34.68  LOS: 0 days   I reviewed Consultant GI notes, hospitalist notes, last 24 h vitals and pain scores, last 48 h intake and output, and last 24 h labs and trends.    Norm Parcel, [facility] 03/15/2023, 10:47 AM Please see Amion for pager number during day hours 7:00am-4:30pm

## 2023-03-15 NOTE — Anesthesia Preprocedure Evaluation (Signed)
Anesthesia Evaluation  Patient identified by MRN, date of birth, ID band Patient awake    Reviewed: Allergy & Precautions, H&P , NPO status , Patient's Chart, lab work & pertinent test results  Airway Mallampati: II  TM Distance: >3 FB Neck ROM: Full    Dental no notable dental hx.    Pulmonary sleep apnea    Pulmonary exam normal breath sounds clear to auscultation       Cardiovascular hypertension, Normal cardiovascular exam Rhythm:Regular Rate:Normal     Neuro/Psych negative neurological ROS  negative psych ROS   GI/Hepatic negative GI ROS, Neg liver ROS,,,  Endo/Other  negative endocrine ROS    Renal/GU negative Renal ROS  negative genitourinary   Musculoskeletal negative musculoskeletal ROS (+)    Abdominal   Peds negative pediatric ROS (+)  Hematology negative hematology ROS (+)   Anesthesia Other Findings   Reproductive/Obstetrics negative OB ROS                             Anesthesia Physical Anesthesia Plan  ASA: 2  Anesthesia Plan: MAC   Post-op Pain Management: Minimal or no pain anticipated   Induction: Intravenous  PONV Risk Score and Plan: 2 and Propofol infusion and Treatment may vary due to age or medical condition  Airway Management Planned: Simple Face Mask  Additional Equipment:   Intra-op Plan:   Post-operative Plan:   Informed Consent: I have reviewed the patients History and Physical, chart, labs and discussed the procedure including the risks, benefits and alternatives for the proposed anesthesia with the patient or authorized representative who has indicated his/her understanding and acceptance.     Dental advisory given  Plan Discussed with: CRNA and Surgeon  Anesthesia Plan Comments:        Anesthesia Quick Evaluation

## 2023-03-15 NOTE — Op Note (Signed)
[facility] Patient Name: Nicholas Stevens Procedure Date: 03/15/2023 MRN: [id number] Attending MD: Gatha Mayer , MD, [id number] Date of Birth: 03-01-1971 CSN: [id number] Age: 52 Admit Type: Inpatient Procedure:                Upper GI endoscopy Indications:              Epigastric abdominal pain, Abdominal pain in the                            right upper quadrant Providers:                Gatha Mayer, MD, Jaci Carrel, RN, William Dalton, Technician Referring MD:              Medicines:                Monitored Anesthesia Care Complications:            No immediate complications. Estimated Blood Loss:     Estimated blood loss: none. Procedure:                Pre-Anesthesia Assessment:                           - Prior to the procedure, a History and Physical                            was performed, and patient medications and                            allergies were reviewed. The patient's tolerance of                            previous anesthesia was also reviewed. The risks                            and benefits of the procedure and the sedation                            options and risks were discussed with the patient.                            All questions were answered, and informed consent                            was obtained. Prior Anticoagulants: The patient                            last took heparin 1 day prior to the procedure. ASA                            Grade Assessment: II - A patient with mild systemic  disease. After reviewing the risks and benefits,                            the patient was deemed in satisfactory condition to                            undergo the procedure.                           After obtaining informed consent, the endoscope was                            passed under direct vision. Throughout the                            procedure, the  patient's blood pressure, pulse, and                            oxygen saturations were monitored continuously. The                            [id number] Olympus endoscope was introduced                            through the mouth, and advanced to the second part                            of duodenum. The upper GI endoscopy was                            accomplished without difficulty. The patient                            tolerated the procedure well. Scope In: Scope Out: Findings:      The examined esophagus was normal.      Evidence of a sleeve gastrectomy was found in the stomach.      The examined duodenum was normal.      The cardia and gastric fundus were otherwise normal on retroflexion. Impression:               - Normal esophagus.                           - A sleeve gastrectomy was found. Antrum seemed                            normal in size                           - Normal examined duodenum.                           - No specimens collected. NO CAUSE OF 2 DAYS OF  PAIN THAT STARTED AFTER EATING AND REGURGITATING A                            HOT DOG Moderate Sedation:      Not Applicable - Patient had care per Anesthesia. Recommendation:           - OK to dc home                           no role for PPI or carafate                           If symptoms recur reassess - he had a borderline GB                            EF at 28%                           Hepatic hemangiomas did not show evidence for                            hemorrhage                           Still needs iron studies - ? if collected                            (microcytosis)                           recommend MVI with iron qd                           f/U GI as needed does not need office visit at dc Procedure Code(s):        --- Professional ---                           [id number], Esophagogastroduodenoscopy, flexible,                            transoral;  diagnostic, including collection of                            specimen(s) by brushing or washing, when performed                            (separate procedure) Diagnosis Code(s):        --- Professional ---                           P38.25, Bariatric surgery status                           R10.13, Epigastric pain                           R10.11, Right upper quadrant pain CPT copyright 2022 [employer]. All rights reserved. The codes documented in this report are preliminary and upon coder review may  be revised to meet current compliance requirements. Gatha Mayer, MD 03/15/2023 1:35:18 PM This report has been signed electronically. Number of Addenda: 0

## 2023-03-15 NOTE — Transfer of Care (Signed)
Immediate Anesthesia Transfer of Care Note  Patient: Nicholas Stevens  Procedure(s) Performed: ESOPHAGOGASTRODUODENOSCOPY (EGD) WITH PROPOFOL  Patient Location: PACU and Endoscopy Unit  Anesthesia Type:MAC  Level of Consciousness: drowsy and patient cooperative  Airway & Oxygen Therapy: Patient Spontanous Breathing and Patient connected to nasal cannula oxygen  Post-op Assessment: Report given to RN and Post -op Vital signs reviewed and stable  Post vital signs: Reviewed and stable  Last Vitals:  Vitals Value Taken Time  BP    Temp    Pulse    Resp    SpO2      Last Pain:  Vitals:   03/15/23 1225  TempSrc: Temporal  PainSc: 0-No pain      Patients Stated Pain Goal: 2 ([id number])  Complications: No notable events documented.

## 2023-03-15 NOTE — Plan of Care (Signed)
  Problem: Education: Goal: Knowledge of General Education information will improve Description Including pain rating scale, medication(s)/side effects and non-pharmacologic comfort measures Outcome: Progressing   Problem: Activity: Goal: Risk for activity intolerance will decrease Outcome: Progressing   Problem: Safety: Goal: Ability to remain free from injury will improve Outcome: Progressing   

## 2023-03-15 NOTE — Anesthesia Postprocedure Evaluation (Signed)
Anesthesia Post Note  Patient: Nicholas Stevens  Procedure(s) Performed: ESOPHAGOGASTRODUODENOSCOPY (EGD) WITH PROPOFOL     Patient location during evaluation: PACU Anesthesia Type: MAC Level of consciousness: awake and alert Pain management: pain level controlled Vital Signs Assessment: post-procedure vital signs reviewed and stable Respiratory status: spontaneous breathing, nonlabored ventilation, respiratory function stable and patient connected to nasal cannula oxygen Cardiovascular status: stable and blood pressure returned to baseline Postop Assessment: no apparent nausea or vomiting Anesthetic complications: no  No notable events documented.  Last Vitals:  Vitals:   03/15/23 1225 03/15/23 1330  BP: (!) 165/95   Pulse: 64   Resp: 14   Temp: 36.5 C (!) (P) 36.4 C  SpO2: 100%     Last Pain:  Vitals:   03/15/23 1330  TempSrc: (P) Temporal  PainSc: (P) 0-No pain                 Junelle Hashemi S

## 2023-03-15 NOTE — Discharge Summary (Signed)
Physician Discharge Summary  Nicholas Stevens [id number] DOB: 07-09-1971 DOA: 03/12/2023  PCP: Cassandria Anger, MD  Admit date: 03/12/2023 Discharge date: 03/15/2023  Admitted From: Home Disposition: Home  Recommendations for Outpatient Follow-up:  Follow up with PCP in 1 week with repeat CBC/BMP Outpatient follow-up with GI and general surgery Follow up in ED if symptoms worsen or new appear   Home Health: No Equipment/Devices: None  Discharge Condition: Stable CODE STATUS: Full Diet recommendation: Heart healthy  Brief/Interim Summary: 52 year old male with history of gastric sleeve surgery, essential hypertension, hypogonadism, morbid HCC, OSA on CPAP presented with worsening upper quadrant and epigastric pain.  On presentation, CTA chest, abdomen, pelvis was negative for aortic aneurysm.  Right upper quadrant ultrasound showed multiple large hemangiomas.  General surgery was consulted.  UGI and HIDA scan were without any significant findings.  GI was consulted and patient underwent EGD on 03/15/2023.  Subsequently, he has been cleared for discharge by GI and general surgery.  He will be discharged home today.  Outpatient follow-up with PCP, GI and general surgery.  Discharge Diagnoses:   Right upper quadrant pain History of gastric sleeve Liver hemangiomas -On presentation, CTA chest, abdomen, pelvis was negative for aortic aneurysm.  Right upper quadrant ultrasound showed multiple large hemangiomas.  General surgery was consulted.  UGI and HIDA scan were without any significant findings.  GI was consulted and patient underwent EGD on 03/15/2023.  Subsequently, he has been cleared for discharge by GI and general surgery.  He will be discharged home today.  Outpatient follow-up with PCP, GI and general surgery. -GI recommended to stop protonix and carafate and use MVI and oral iron supplement on discharge.  Essential hypertension--blood pressure currently controlled.   Continue current regimen.  Outpatient follow-up  Hypokalemia Hyponatremia Hypomagnesemia -Resolved  OSA -Continue CPAP at night  Obesity -Outpatient follow-up  Discharge Instructions   Allergies as of 03/15/2023   No Known Allergies      Medication List     STOP taking these medications    cefdinir 300 MG capsule Commonly known as: OMNICEF   oxyCODONE 5 MG immediate release tablet Commonly known as: Roxicodone       TAKE these medications    anastrozole 1 MG tablet Commonly known as: ARIMIDEX Take 1 mg by mouth once a week.   B-12 PO Take 1 tablet by mouth daily.   Fish Oil 1000 MG Caps Take 1,000 mg by mouth daily.   GINGER PO Take 1 capsule by mouth daily.   latanoprost 0.005 % ophthalmic solution Commonly known as: XALATAN Place 1 drop into both eyes at bedtime.   multivitamin with minerals Tabs tablet Take 1 tablet by mouth daily.   ondansetron 4 MG tablet Commonly known as: ZOFRAN Take 1 tablet (4 mg total) by mouth every 6 (six) hours. What changed:  when to take this reasons to take this   pantoprazole 20 MG tablet Commonly known as: PROTONIX Take 1 tablet (20 mg total) by mouth daily.   polyethylene glycol 17 g packet Commonly known as: MIRALAX / GLYCOLAX Take 17 g by mouth daily as needed for mild constipation.   sucralfate 1 g tablet Commonly known as: Carafate Take 1 tablet (1 g total) by mouth 4 (four) times daily -  with meals and at bedtime for 14 days.   tadalafil 20 MG tablet Commonly known as: CIALIS TAKE 1 TABLET BY MOUTH DAILY AS NEEDED FOR ERECTILE DYSFUNCTION What changed: See the new instructions.  triamterene-hydrochlorothiazide 37.5-25 MG capsule Commonly known as: DYAZIDE TAKE 1 CAPSULE BY MOUTH DAILY What changed:  how much to take how to take this when to take this additional instructions   TURMERIC PO Take 1 capsule by mouth daily.   Vitamin D3 50 MCG (2000 UT) Caps Generic drug:  Cholecalciferol Take 1 capsule (2,000 Units total) daily by mouth.   Wegovy 1 MG/0.5ML Soaj Generic drug: Semaglutide-Weight Management Inject 1 mg into the skin once a week.   zolpidem 10 MG tablet Commonly known as: AMBIEN TAKE 1 TABLET BY MOUTH EVERYDAY AT BEDTIME What changed: See the new instructions.        No Known Allergies  Consultations: General surgery/GI   Procedures/Studies: NM Hepato W/EF  Result Date: 03/13/2023 CLINICAL DATA:  Postprandial abdominal pain and nausea and vomiting. Previous sleeve gastrectomy. EXAM: NUCLEAR MEDICINE HEPATOBILIARY IMAGING WITH GALLBLADDER EF TECHNIQUE: Sequential images of the abdomen were obtained out to 60 minutes following intravenous administration of radiopharmaceutical. After oral ingestion of Ensure, gallbladder ejection fraction was determined. At 60 min, normal ejection fraction is greater than 33%. RADIOPHARMACEUTICALS:  5.2 mCi Tc-74m  Choletec IV COMPARISON:  None Available. FINDINGS: Prompt uptake and biliary excretion of activity by the liver is seen. Gallbladder activity is visualized, consistent with patency of cystic duct. Biliary activity passes into small bowel, consistent with patent common bile duct. Calculated gallbladder ejection fraction is 28%. (Normal gallbladder ejection fraction with Ensure is greater than 33%.) IMPRESSION: Normal hepatobiliary scan, demonstrating patency of both the cystic and common bile ducts. Low gallbladder ejection fraction of 28%. Electronically Signed   By: Marlaine Hind M.D.   On: 03/13/2023 15:33   DG UGI W SINGLE CM (SOL OR THIN BA)  Result Date: 03/13/2023 CLINICAL DATA:  52 year old male with history of gastric sleeve creation in 2014 admitted with epigastric abdominal pain for 2 days after ingesting a hot dog. EXAM: DG UGI W SINGLE CM TECHNIQUE: Single contrast examination was performed using thin liquid barium. This exam was performed by Brynda Greathouse PA-C, and was supervised and  interpreted by Richardean Sale, MD. FLUOROSCOPY: Radiation Exposure Index (as provided by the fluoroscopic device): 61.1 mGy Kerma COMPARISON:  CT Chest, Abdomen and pelvis 03/11/23 FINDINGS: Esophagus: Normal appearance. Esophageal motility: No evidence of esophageal mass, stricture or ulceration. No evidence of impacted foreign body. Gastroesophageal reflux: None visualized. Ingested 85mm barium tablet: Passed normally into the stomach. Stomach: Expected appearance status post gastric sleeve resection. No mucosal ulceration or leak. Gastric emptying: Normal. Duodenum: Normal appearance. Other:  None. IMPRESSION: No significant findings or explanation for the patient's symptoms. No complication of previous gastric sleeve surgery identified. Electronically Signed   By: Richardean Sale M.D.   On: 03/13/2023 15:32   ECHOCARDIOGRAM COMPLETE  Result Date: 03/13/2023    ECHOCARDIOGRAM REPORT   Patient Name:   CALDEN DORSEY Date of Exam: 03/13/2023 Medical Rec #:  [id number]           Height:       74.5 in Accession #:    [id number]          Weight:       273.8 lb Date of Birth:  1971-01-15          BSA:          2.495 m Patient Age:    52 years            BP:           136/83  mmHg Patient Gender: M                   HR:           65 bpm. Exam Location:  Inpatient Procedure: 2D Echo, Color Doppler, Cardiac Doppler and Intracardiac            Opacification Agent Indications:    CHF - Acute Systolic  History:        Patient has no prior history of Echocardiogram examinations.                 Risk Factors:Sleep Apnea, Obesity and Hypertension.  Sonographer:    Eartha Inch Referring Phys: [place]:[id number] [place] Comments: Image acquisition challenging due to patient body habitus and Image acquisition challenging due to respiratory motion. IMPRESSIONS  1. Left ventricular ejection fraction, by estimation, is 55 to 60%. The left ventricle has normal function. The left ventricle has no regional wall motion  abnormalities. Left ventricular diastolic parameters were normal.  2. Right ventricular systolic function is normal. The right ventricular size is normal. Tricuspid regurgitation signal is inadequate for assessing PA pressure.  3. The mitral valve is grossly normal. Trivial mitral valve regurgitation. No evidence of mitral stenosis.  4. The aortic valve is tricuspid. Aortic valve regurgitation is not visualized. No aortic stenosis is present.  5. The inferior vena cava is normal in size with greater than 50% respiratory variability, suggesting right atrial pressure of 3 mmHg. FINDINGS  Left Ventricle: Left ventricular ejection fraction, by estimation, is 55 to 60%. The left ventricle has normal function. The left ventricle has no regional wall motion abnormalities. Definity contrast agent was given IV to delineate the left ventricular  endocardial borders. The left ventricular internal cavity size was normal in size. There is no left ventricular hypertrophy. Left ventricular diastolic parameters were normal. Right Ventricle: The right ventricular size is normal. No increase in right ventricular wall thickness. Right ventricular systolic function is normal. Tricuspid regurgitation signal is inadequate for assessing PA pressure. Left Atrium: Left atrial size was normal in size. Right Atrium: Right atrial size was normal in size. Pericardium: There is no evidence of pericardial effusion. Mitral Valve: The mitral valve is grossly normal. Trivial mitral valve regurgitation. No evidence of mitral valve stenosis. Tricuspid Valve: The tricuspid valve is grossly normal. Tricuspid valve regurgitation is trivial. No evidence of tricuspid stenosis. Aortic Valve: The aortic valve is tricuspid. Aortic valve regurgitation is not visualized. No aortic stenosis is present. Pulmonic Valve: The pulmonic valve was grossly normal. Pulmonic valve regurgitation is trivial. No evidence of pulmonic stenosis. Aorta: The aortic root and  ascending aorta are structurally normal, with no evidence of dilitation. Venous: The inferior vena cava is normal in size with greater than 50% respiratory variability, suggesting right atrial pressure of 3 mmHg. IAS/Shunts: The atrial septum is grossly normal.  LEFT VENTRICLE PLAX 2D LVIDd:         4.30 cm      Diastology LVIDs:         3.00 cm      LV e' medial:    8.16 cm/s LV PW:         1.00 cm      LV E/e' medial:  10.2 LV IVS:        1.00 cm      LV e' lateral:   6.96 cm/s LVOT diam:     2.50 cm      LV E/e'  lateral: 12.0 LV SV:         92 LV SV Index:   37 LVOT Area:     4.91 cm  LV Volumes (MOD) LV vol d, MOD A2C: 82.8 ml LV vol d, MOD A4C: 153.0 ml LV vol s, MOD A2C: 36.0 ml LV vol s, MOD A4C: 61.6 ml LV SV MOD A2C:     46.8 ml LV SV MOD A4C:     153.0 ml LV SV MOD BP:      65.3 ml RIGHT VENTRICLE             IVC RV S prime:     11.50 cm/s  IVC diam: 2.00 cm TAPSE (M-mode): 2.3 cm LEFT ATRIUM             Index        RIGHT ATRIUM           Index LA diam:        3.20 cm 1.28 cm/m   RA Area:     12.30 cm LA Vol (A2C):   47.5 ml 19.04 ml/m  RA Volume:   23.80 ml  9.54 ml/m LA Vol (A4C):   46.5 ml 18.63 ml/m LA Biplane Vol: 47.3 ml 18.96 ml/m  AORTIC VALVE LVOT Vmax:   83.60 cm/s LVOT Vmean:  60.200 cm/s LVOT VTI:    0.188 m  AORTA Ao Root diam: 3.70 cm Ao Asc diam:  3.40 cm MITRAL VALVE MV Area (PHT): 3.05 cm    SHUNTS MV Decel Time: 249 msec    Systemic VTI:  0.19 m MV E velocity: 83.20 cm/s  Systemic Diam: 2.50 cm MV A velocity: 64.30 cm/s MV E/A ratio:  1.29 Eleonore Chiquito MD Electronically signed by Eleonore Chiquito MD Signature Date/Time: 03/13/2023/9:19:09 AM    Final    DG Chest 2 View  Result Date: 03/12/2023 CLINICAL DATA:  Chest pain. EXAM: CHEST - 2 VIEW COMPARISON:  Chest CTA yesterday FINDINGS: The cardiomediastinal contours are normal. The lungs are clear. Pulmonary vasculature is normal. No consolidation, pleural effusion, or pneumothorax. No acute osseous abnormalities are seen.  IMPRESSION: Negative radiographs of the chest. Electronically Signed   By: Keith Rake M.D.   On: 03/12/2023 19:52   US Abdomen Limited RUQ (LIVER/GB)  Result Date: 03/12/2023 CLINICAL DATA:  Right upper quadrant pain, nausea and vomiting. EXAM: ULTRASOUND ABDOMEN LIMITED RIGHT UPPER QUADRANT COMPARISON:  06/06/2013, CT 03/11/2023 and MRI abdomen 11/11/2023 FINDINGS: Gallbladder: No gallstones or wall thickening visualized. No sonographic Murphy sign noted by sonographer. Common bile duct: Not visualized. Liver: Multiple known large hemangiomas. Caudate lobe mass measures 6 x 6.9 x 8.1 cm. Right lobe mass measures 13.1 x 5.4 x 13.1 cm. Portal vein is patent on color Doppler imaging with normal direction of blood flow towards the liver. Other: No free fluid. IMPRESSION: 1. No acute hepatobiliary disease. 2. Multiple known large hemangiomas. Electronically Signed   By: Marin Olp M.D.   On: 03/12/2023 13:20   CT Angio Chest/Abd/Pel for Dissection W and/or Wo Contrast  Result Date: 03/11/2023 CLINICAL DATA:  Epigastric pain, concern for acute aortic syndrome. EXAM: CT ANGIOGRAPHY CHEST, ABDOMEN AND PELVIS TECHNIQUE: Non-contrast CT of the chest was initially obtained. Multidetector CT imaging through the chest, abdomen and pelvis was performed using the standard protocol during bolus administration of intravenous contrast. Multiplanar reconstructed images and MIPs were obtained and reviewed to evaluate the vascular anatomy. RADIATION DOSE REDUCTION: This exam was performed according to the departmental dose-optimization  program which includes automated exposure control, adjustment of the mA and/or kV according to patient size and/or use of iterative reconstruction technique. CONTRAST:  163mL OMNIPAQUE IOHEXOL 350 MG/ML SOLN COMPARISON:  CT chest dated 04/03/2020 and MR abdomen dated 11/10/2013. FINDINGS: CTA CHEST FINDINGS Cardiovascular: Preferential opacification of the thoracic aorta. No evidence of  thoracic aortic intramural hematoma, aneurysm or dissection. Normal heart size. No pericardial effusion. Mediastinum/Nodes: No enlarged mediastinal, hilar, or axillary lymph nodes. Thyroid gland, trachea, and esophagus demonstrate no significant findings. Lungs/Pleura: Lungs are clear. No pleural effusion or pneumothorax. Musculoskeletal: Degenerative changes are seen in the spine. Review of the MIP images confirms the above findings. CTA ABDOMEN AND PELVIS FINDINGS VASCULAR Aorta: Normal caliber aorta without aneurysm, dissection, vasculitis or significant stenosis. Celiac: Patent without evidence of aneurysm, dissection, vasculitis or significant stenosis. SMA: Patent without evidence of aneurysm, dissection, vasculitis or significant stenosis. Renals: Both renal arteries are patent without evidence of aneurysm, dissection, vasculitis, fibromuscular dysplasia or significant stenosis. IMA: Patent without evidence of aneurysm, dissection, vasculitis or significant stenosis. Inflow: Mild atherosclerotic disease of the right common iliac artery. No aneurysm, dissection, vasculitis or significant stenosis. Veins: No obvious venous abnormality within the limitations of this arterial phase study. Review of the MIP images confirms the above findings. NON-VASCULAR Hepatobiliary: Multiple liver hemangiomas are redemonstrated. No gallstones, gallbladder wall thickening, or bile duct dilatation. Pancreas: Unremarkable. No pancreatic ductal dilatation or surrounding inflammatory changes. Spleen: Normal in size without focal abnormality. Adrenals/Urinary Tract: Adrenal glands are unremarkable. Kidneys are normal, without renal calculi, focal lesion, or hydronephrosis. Bladder is unremarkable. Stomach/Bowel: The patient is status post a sleeve gastrectomy. Insert appendectomy There is colonic diverticulosis without evidence of diverticulitis. no evidence of bowel wall thickening, distention, or inflammatory changes. Lymphatic:  No lymphadenopathy in the abdomen or pelvis. Reproductive: Prostate is unremarkable. Other: No abdominal wall hernia or abnormality. No abdominopelvic ascites. Musculoskeletal: No acute or significant osseous findings. Review of the MIP images confirms the above findings. IMPRESSION: 1. No evidence for intramural hematoma, aortic dissection, or aneurysm. 2. No acute process in the chest, abdomen, or pelvis. Electronically Signed   By: Zerita Boers M.D.   On: 03/11/2023 19:33      Subjective: Patient seen and examined at bedside.  No fever, worsening vomiting, chest pain or shortness of breath reported.  Discharge Exam: Vitals:   03/14/23 2222 03/15/23 0529  BP: 135/84 122/88  Pulse: 70 69  Resp: 18 17  Temp: 98.7 F (37.1 C) 98.1 F (36.7 C)  SpO2: 97% 97%    General: Pt is alert, awake, not in acute distress.  On room air. Cardiovascular: rate controlled, S1/S2 + Respiratory: bilateral decreased breath sounds at bases Abdominal: Soft, obese, NT, ND, bowel sounds + Extremities: no edema, no cyanosis    The results of significant diagnostics from this hospitalization (including imaging, microbiology, ancillary and laboratory) are listed below for reference.     Microbiology: No results found for this or any previous visit (from the past 240 hour(s)).   Labs: BNP (last 3 results) No results for input(s): "BNP" in the last 8760 hours. Basic Metabolic Panel: Recent Labs  Lab 03/12/23 1131 03/12/23 1812 03/13/23 0319 03/14/23 0333 03/15/23 0331  NA 134* 135 132* 137 137  K 3.7 3.7 3.4* 3.7 3.8  CL 99 101 102 102 102  CO2 21* 27 24 25 29   GLUCOSE 95 88 84 82 92  BUN 10 9 8 8 8   CREATININE 1.19 1.07 0.92 0.94 1.15  CALCIUM 10.6* 9.1 8.7* 9.1 8.7*  MG  --  2.3 1.9 2.0 2.1  PHOS  --  3.7 3.0 3.6 3.4   Liver Function Tests: Recent Labs  Lab 03/12/23 1206 03/12/23 1812 03/13/23 0319 03/14/23 0333 03/15/23 0331  AST 25 20 19 15 16   ALT 21 23 19 21 20   ALKPHOS 40  35* 32* 34* 33*  BILITOT 1.3* 1.3* 1.2 0.8 0.9  PROT 8.3* 7.6 6.6 6.8 7.0  ALBUMIN 4.8 4.5 3.6 3.7 3.9   Recent Labs  Lab 03/11/23 2013 03/12/23 1131  LIPASE 18 16   No results for input(s): "AMMONIA" in the last 168 hours. CBC: Recent Labs  Lab 03/11/23 1831 03/12/23 1131 03/12/23 1812 03/13/23 0917 03/14/23 0333 03/15/23 0331  WBC 4.4 5.0 5.2 3.6* 3.7* 3.9*  NEUTROABS 2.5  --  2.9 1.8 1.5* 1.6*  HGB 16.2 16.8 15.3 15.0 15.1 14.7  HCT 50.3 51.8 49.5 49.6 47.8 47.2  MCV 75.0* 75.2* 78.3* 78.5* 77.2* 77.8*  PLT 270 249 241 241 233 231   Cardiac Enzymes: No results for input(s): "CKTOTAL", "CKMB", "CKMBINDEX", "TROPONINI" in the last 168 hours. BNP: Invalid input(s): "POCBNP" CBG: No results for input(s): "GLUCAP" in the last 168 hours. D-Dimer No results for input(s): "DDIMER" in the last 72 hours. Hgb A1c No results for input(s): "HGBA1C" in the last 72 hours. Lipid Profile No results for input(s): "CHOL", "HDL", "LDLCALC", "TRIG", "CHOLHDL", "LDLDIRECT" in the last 72 hours. Thyroid function studies Recent Labs    03/12/23 1812  TSH 0.715   Anemia work up No results for input(s): "VITAMINB12", "FOLATE", "FERRITIN", "TIBC", "IRON", "RETICCTPCT" in the last 72 hours. Urinalysis    Component Value Date/Time   COLORURINE YELLOW 01/19/2023 1026   APPEARANCEUR CLEAR 01/19/2023 1026   LABSPEC 1.015 01/19/2023 1026   PHURINE 7.0 01/19/2023 1026   GLUCOSEU NEGATIVE 01/19/2023 1026   HGBUR NEGATIVE 01/19/2023 1026   BILIRUBINUR NEGATIVE 01/19/2023 1026   KETONESUR NEGATIVE 01/19/2023 1026   UROBILINOGEN 0.2 01/19/2023 1026   NITRITE NEGATIVE 01/19/2023 1026   LEUKOCYTESUR NEGATIVE 01/19/2023 1026   Sepsis Labs Recent Labs  Lab 03/12/23 1812 03/13/23 0917 03/14/23 0333 03/15/23 0331  WBC 5.2 3.6* 3.7* 3.9*   Microbiology No results found for this or any previous visit (from the past 240 hour(s)).   Time coordinating discharge: 35  minutes  SIGNED:   Aline August, MD  [facility] 03/15/2023, 11:46 AM

## 2023-03-16 ENCOUNTER — Encounter (HOSPITAL_COMMUNITY): Payer: Self-pay | Admitting: Internal Medicine

## 2023-03-16 ENCOUNTER — Telehealth: Payer: Self-pay

## 2023-03-16 NOTE — Transitions of Care (Post Inpatient/ED Visit) (Signed)
   03/16/2023  Name: Nicholas Stevens MRN: [id number] DOB: 03-21-1971  Today's TOC FU Call Status: Today's TOC FU Call Status:: Successful TOC FU Call Competed TOC FU Call Complete Date: 03/16/23  Transition Care Management Follow-up Telephone Call Date of Discharge: 03/15/23 Discharge Facility: [facility]) Type of Discharge: Inpatient Admission Primary Inpatient Discharge Diagnosis:: Right upper quadrant pain How have you been since you were released from the hospital?: Better Any questions or concerns?: No  Items Reviewed: Did you receive and understand the discharge instructions provided?: Yes Medications obtained and verified?: Yes (Medications Reviewed) Any new allergies since your discharge?: No Dietary orders reviewed?: NA Do you have support at home?: Yes People in Home: significant other  Home Care and Equipment/Supplies: [place] Ordered?: NA Any new equipment or medical supplies ordered?: NA  Functional Questionnaire: Do you need assistance with bathing/showering or dressing?: No Do you need assistance with meal preparation?: No Do you need assistance with eating?: No Do you have difficulty maintaining continence: No Do you need assistance with getting out of bed/getting out of a chair/moving?: No Do you have difficulty managing or taking your medications?: No  Follow up appointments reviewed: PCP Follow-up appointment confirmed?: Yes Date of PCP follow-up appointment?: 03/29/23 Follow-up Provider: [facility] Follow-up appointment confirmed?: Yes Date of Specialist follow-up appointment?: 03/22/23 Follow-Up Specialty Provider:: Dr Hassell Done Do you need transportation to your follow-up appointment?: No Do you understand care options if your condition(s) worsen?: Yes-patient verbalized understanding    [place] LPN [place] Direct Dial [phone]

## 2023-03-18 NOTE — Telephone Encounter (Signed)
Noted. Thanks.

## 2023-03-29 ENCOUNTER — Ambulatory Visit: Payer: Commercial Managed Care - PPO | Admitting: Internal Medicine

## 2023-04-06 ENCOUNTER — Encounter: Payer: Self-pay | Admitting: Internal Medicine

## 2023-04-06 ENCOUNTER — Ambulatory Visit: Payer: Commercial Managed Care - PPO | Admitting: Internal Medicine

## 2023-04-06 VITALS — BP 120/78 | HR 75 | Temp 98.3°F | Ht 74.5 in | Wt 268.0 lb

## 2023-04-06 DIAGNOSIS — R1011 Right upper quadrant pain: Secondary | ICD-10-CM | POA: Diagnosis not present

## 2023-04-06 DIAGNOSIS — E611 Iron deficiency: Secondary | ICD-10-CM

## 2023-04-06 DIAGNOSIS — Z903 Acquired absence of stomach [part of]: Secondary | ICD-10-CM | POA: Diagnosis not present

## 2023-04-06 DIAGNOSIS — E669 Obesity, unspecified: Secondary | ICD-10-CM | POA: Diagnosis not present

## 2023-04-06 MED ORDER — HYOSCYAMINE SULFATE 0.125 MG PO TABS: 0.1250 mg | ORAL_TABLET | ORAL | 1 refills | Status: DC | PRN

## 2023-04-06 MED ORDER — WEGOVY 1 MG/0.5ML ~~LOC~~ SOAJ: 1.0000 mg | SUBCUTANEOUS | 3 refills | Status: DC

## 2023-04-06 NOTE — Assessment & Plan Note (Signed)
Cont to use [facility] w/caution. Increase the dose in 2 wks if ok

## 2023-04-06 NOTE — Assessment & Plan Note (Signed)
Mild - likely due to gastric sleeve - on po iron

## 2023-04-06 NOTE — Progress Notes (Signed)
Subjective:  Patient ID: Nicholas Stevens, male    DOB: [birth year]  Age: 52 y.o. MRN: [id number]  CC: No chief complaint on file.   HPI Nicholas Stevens presents for RUQ abd pain - resolved after 2 days. He was on Wegovy x 1-2 mo prior. No pain  - using Wegovy ok.  Outpatient Medications Prior to Visit  Medication Sig Dispense Refill   anastrozole (ARIMIDEX) 1 MG tablet Take 1 mg by mouth once a week.     Cholecalciferol (VITAMIN D3) 2000 units capsule Take 1 capsule (2,000 Units total) daily by mouth. 100 capsule 3   Cyanocobalamin (B-12 PO) Take 1 tablet by mouth daily.     FEROSUL 325 (65 Fe) MG tablet Take by mouth.     Ginger, Zingiber officinalis, (GINGER PO) Take 1 capsule by mouth daily.     latanoprost (XALATAN) 0.005 % ophthalmic solution Place 1 drop into both eyes at bedtime.     Multiple Vitamin (MULTIVITAMIN WITH MINERALS) TABS tablet Take 1 tablet by mouth daily.     Omega-3 Fatty Acids (FISH OIL) 1000 MG CAPS Take 1,000 mg by mouth daily.     pantoprazole (PROTONIX) 20 MG tablet Take 20 mg by mouth daily.     Semaglutide-Weight Management (WEGOVY) 1 MG/0.5ML SOAJ Inject 1 mg into the skin once a week. 2 mL 3   tadalafil (CIALIS) 20 MG tablet TAKE 1 TABLET BY MOUTH DAILY AS NEEDED FOR ERECTILE DYSFUNCTION (Patient taking differently: Take 10 mg by mouth daily as needed for erectile dysfunction.) 12 tablet 5   triamterene-hydrochlorothiazide (DYAZIDE) 37.5-25 MG capsule TAKE 1 CAPSULE BY MOUTH DAILY (Patient taking differently: Take 1 capsule by mouth daily.) 90 capsule 2   TURMERIC PO Take 1 capsule by mouth daily.     zolpidem (AMBIEN) 10 MG tablet TAKE 1 TABLET BY MOUTH EVERYDAY AT BEDTIME (Patient taking differently: Take 10 mg by mouth at bedtime.) 90 tablet 1   ferrous sulfate 325 (65 FE) MG EC tablet Take 1 tablet (325 mg total) by mouth daily with breakfast. 30 tablet 0   ondansetron (ZOFRAN) 4 MG tablet Take 1 tablet (4 mg total) by mouth every 6 (six) hours.  (Patient taking differently: Take 4 mg by mouth every 8 (eight) hours as needed for nausea or vomiting.) 12 tablet 0   polyethylene glycol (MIRALAX / GLYCOLAX) 17 g packet Take 17 g by mouth daily as needed for mild constipation. 14 each 0   No facility-administered medications prior to visit.    ROS: Review of Systems  Constitutional:  Negative for appetite change, fatigue and unexpected weight change.  HENT:  Negative for congestion, nosebleeds, sneezing, sore throat and trouble swallowing.   Eyes:  Negative for itching and visual disturbance.  Respiratory:  Negative for cough.   Cardiovascular:  Negative for chest pain, palpitations and leg swelling.  Gastrointestinal:  Negative for abdominal distention, blood in stool, diarrhea and nausea.  Genitourinary:  Negative for frequency and hematuria.  Musculoskeletal:  Negative for back pain, gait problem, joint swelling and neck pain.  Skin:  Negative for rash.  Neurological:  Negative for dizziness, tremors, speech difficulty and weakness.  Psychiatric/Behavioral:  Negative for agitation, dysphoric mood and sleep disturbance. The patient is not nervous/anxious.     Objective:  BP 120/78 (BP Location: Left Arm, Patient Position: Sitting, Cuff Size: Large)   Pulse 75   Temp 98.3 F (36.8 C) (Oral)   Ht 6' 2.5" (1.892 m)   [employer]  268 lb (121.6 kg)   SpO2 97%   BMI 33.95 kg/m   BP Readings from Last 3 Encounters:  04/06/23 120/78  03/15/23 (!) 150/100  03/11/23 123/86    Wt Readings from Last 3 Encounters:  04/06/23 268 lb (121.6 kg)  03/15/23 274 lb 14.6 oz (124.7 kg)  03/11/23 265 lb (120.2 kg)    Physical Exam Constitutional:      General: He is not in acute distress.    Appearance: He is well-developed.     Comments: NAD  Eyes:     Conjunctiva/sclera: Conjunctivae normal.     Pupils: Pupils are equal, round, and reactive to light.  Neck:     Thyroid: No thyromegaly.     Vascular: No JVD.  Cardiovascular:     Rate  and Rhythm: Normal rate and regular rhythm.     Heart sounds: Normal heart sounds. No murmur heard.    No friction rub. No gallop.  Pulmonary:     Effort: Pulmonary effort is normal. No respiratory distress.     Breath sounds: Normal breath sounds. No wheezing or rales.  Chest:     Chest wall: No tenderness.  Abdominal:     General: Bowel sounds are normal. There is no distension.     Palpations: Abdomen is soft. There is no mass.     Tenderness: There is no abdominal tenderness. There is no guarding or rebound.  Musculoskeletal:        General: No tenderness. Normal range of motion.     Cervical back: Normal range of motion.  Lymphadenopathy:     Cervical: No cervical adenopathy.  Skin:    General: Skin is warm and dry.     Findings: No rash.  Neurological:     Mental Status: He is alert and oriented to person, place, and time.     Cranial Nerves: No cranial nerve deficit.     Motor: No abnormal muscle tone.     Coordination: Coordination normal.     Gait: Gait normal.     Deep Tendon Reflexes: Reflexes are normal and symmetric.  Psychiatric:        Behavior: Behavior normal.        Thought Content: Thought content normal.        Judgment: Judgment normal.     Lab Results  Component Value Date   WBC 3.9 (L) 03/15/2023   HGB 14.7 03/15/2023   HCT 47.2 03/15/2023   PLT 231 03/15/2023   GLUCOSE 92 03/15/2023   CHOL 171 01/19/2023   TRIG 117.0 01/19/2023   HDL 29.10 (L) 01/19/2023   LDLDIRECT 116.0 10/26/2020   LDLCALC 119 (H) 01/19/2023   ALT 20 03/15/2023   AST 16 03/15/2023   NA 137 03/15/2023   K 3.8 03/15/2023   CL 102 03/15/2023   CREATININE 1.15 03/15/2023   BUN 8 03/15/2023   CO2 29 03/15/2023   TSH 0.715 03/12/2023   PSA 1.01 01/19/2023    NM Hepato W/EF  Result Date: 03/13/2023 CLINICAL DATA:  Postprandial abdominal pain and nausea and vomiting. Previous sleeve gastrectomy. EXAM: NUCLEAR MEDICINE HEPATOBILIARY IMAGING WITH GALLBLADDER EF TECHNIQUE:  Sequential images of the abdomen were obtained out to 60 minutes following intravenous administration of radiopharmaceutical. After oral ingestion of Ensure, gallbladder ejection fraction was determined. At 60 min, normal ejection fraction is greater than 33%. RADIOPHARMACEUTICALS:  5.2 mCi Tc-41m  Choletec IV COMPARISON:  None Available. FINDINGS: Prompt uptake and biliary excretion of activity by the liver  is seen. Gallbladder activity is visualized, consistent with patency of cystic duct. Biliary activity passes into small bowel, consistent with patent common bile duct. Calculated gallbladder ejection fraction is 28%. (Normal gallbladder ejection fraction with Ensure is greater than 33%.) IMPRESSION: Normal hepatobiliary scan, demonstrating patency of both the cystic and common bile ducts. Low gallbladder ejection fraction of 28%. Electronically Signed   By: Danae Orleans M.D.   On: 03/13/2023 15:33   DG UGI W SINGLE CM (SOL OR THIN BA)  Result Date: 03/13/2023 CLINICAL DATA:  52 year old male with history of gastric sleeve creation in 2014 admitted with epigastric abdominal pain for 2 days after ingesting a hot dog. EXAM: DG UGI W SINGLE CM TECHNIQUE: Single contrast examination was performed using thin liquid barium. This exam was performed by Loyce Dys PA-C, and was supervised and interpreted by Carey Bullocks, MD. FLUOROSCOPY: Radiation Exposure Index (as provided by the fluoroscopic device): 61.1 mGy Kerma COMPARISON:  CT Chest, Abdomen and pelvis 03/11/23 FINDINGS: Esophagus: Normal appearance. Esophageal motility: No evidence of esophageal mass, stricture or ulceration. No evidence of impacted foreign body. Gastroesophageal reflux: None visualized. Ingested 13mm barium tablet: Passed normally into the stomach. Stomach: Expected appearance status post gastric sleeve resection. No mucosal ulceration or leak. Gastric emptying: Normal. Duodenum: Normal appearance. Other:  None. IMPRESSION: No  significant findings or explanation for the patient's symptoms. No complication of previous gastric sleeve surgery identified. Electronically Signed   By: Carey Bullocks M.D.   On: 03/13/2023 15:32   ECHOCARDIOGRAM COMPLETE  Result Date: 03/13/2023    ECHOCARDIOGRAM REPORT   Patient Name:   Nicholas Stevens Date of Exam: 03/13/2023 Medical Rec #:  [id number]           Height:       74.5 in Accession #:    [id number]          Weight:       273.8 lb Date of Birth:  [birth year]          BSA:          2.495 m Patient Age:    51 years            BP:           136/83 mmHg Patient Gender: M                   HR:           65 bpm. Exam Location:  Inpatient Procedure: 2D Echo, Color Doppler, Cardiac Doppler and Intracardiac            Opacification Agent Indications:    CHF - Acute Systolic  History:        Patient has no prior history of Echocardiogram examinations.                 Risk Factors:Sleep Apnea, Obesity and Hypertension.  Sonographer:    Milda Smart Referring Phys: [id number] CURTIS J WOODS  Sonographer Comments: Image acquisition challenging due to patient body habitus and Image acquisition challenging due to respiratory motion. IMPRESSIONS  1. Left ventricular ejection fraction, by estimation, is 55 to 60%. The left ventricle has normal function. The left ventricle has no regional wall motion abnormalities. Left ventricular diastolic parameters were normal.  2. Right ventricular systolic function is normal. The right ventricular size is normal. Tricuspid regurgitation signal is inadequate for assessing PA pressure.  3. The mitral valve is grossly normal. Trivial mitral valve regurgitation. No evidence  of mitral stenosis.  4. The aortic valve is tricuspid. Aortic valve regurgitation is not visualized. No aortic stenosis is present.  5. The inferior vena cava is normal in size with greater than 50% respiratory variability, suggesting right atrial pressure of 3 mmHg. FINDINGS  Left Ventricle: Left  ventricular ejection fraction, by estimation, is 55 to 60%. The left ventricle has normal function. The left ventricle has no regional wall motion abnormalities. Definity contrast agent was given IV to delineate the left ventricular  endocardial borders. The left ventricular internal cavity size was normal in size. There is no left ventricular hypertrophy. Left ventricular diastolic parameters were normal. Right Ventricle: The right ventricular size is normal. No increase in right ventricular wall thickness. Right ventricular systolic function is normal. Tricuspid regurgitation signal is inadequate for assessing PA pressure. Left Atrium: Left atrial size was normal in size. Right Atrium: Right atrial size was normal in size. Pericardium: There is no evidence of pericardial effusion. Mitral Valve: The mitral valve is grossly normal. Trivial mitral valve regurgitation. No evidence of mitral valve stenosis. Tricuspid Valve: The tricuspid valve is grossly normal. Tricuspid valve regurgitation is trivial. No evidence of tricuspid stenosis. Aortic Valve: The aortic valve is tricuspid. Aortic valve regurgitation is not visualized. No aortic stenosis is present. Pulmonic Valve: The pulmonic valve was grossly normal. Pulmonic valve regurgitation is trivial. No evidence of pulmonic stenosis. Aorta: The aortic root and ascending aorta are structurally normal, with no evidence of dilitation. Venous: The inferior vena cava is normal in size with greater than 50% respiratory variability, suggesting right atrial pressure of 3 mmHg. IAS/Shunts: The atrial septum is grossly normal.  LEFT VENTRICLE PLAX 2D LVIDd:         4.30 cm      Diastology LVIDs:         3.00 cm      LV e' medial:    8.16 cm/s LV PW:         1.00 cm      LV E/e' medial:  10.2 LV IVS:        1.00 cm      LV e' lateral:   6.96 cm/s LVOT diam:     2.50 cm      LV E/e' lateral: 12.0 LV SV:         92 LV SV Index:   37 LVOT Area:     4.91 cm  LV Volumes (MOD) LV vol  d, MOD A2C: 82.8 ml LV vol d, MOD A4C: 153.0 ml LV vol s, MOD A2C: 36.0 ml LV vol s, MOD A4C: 61.6 ml LV SV MOD A2C:     46.8 ml LV SV MOD A4C:     153.0 ml LV SV MOD BP:      65.3 ml RIGHT VENTRICLE             IVC RV S prime:     11.50 cm/s  IVC diam: 2.00 cm TAPSE (M-mode): 2.3 cm LEFT ATRIUM             Index        RIGHT ATRIUM           Index LA diam:        3.20 cm 1.28 cm/m   RA Area:     12.30 cm LA Vol (A2C):   47.5 ml 19.04 ml/m  RA Volume:   23.80 ml  9.54 ml/m LA Vol (A4C):   46.5 ml 18.63 ml/m LA Biplane  Vol: 47.3 ml 18.96 ml/m  AORTIC VALVE LVOT Vmax:   83.60 cm/s LVOT Vmean:  60.200 cm/s LVOT VTI:    0.188 m  AORTA Ao Root diam: 3.70 cm Ao Asc diam:  3.40 cm MITRAL VALVE MV Area (PHT): 3.05 cm    SHUNTS MV Decel Time: 249 msec    Systemic VTI:  0.19 m MV E velocity: 83.20 cm/s  Systemic Diam: 2.50 cm MV A velocity: 64.30 cm/s MV E/A ratio:  1.29 Lennie Odor MD Electronically signed by Lennie Odor MD Signature Date/Time: 03/13/2023/9:19:09 AM    Final    DG Chest 2 View  Result Date: 03/12/2023 CLINICAL DATA:  Chest pain. EXAM: CHEST - 2 VIEW COMPARISON:  Chest CTA yesterday FINDINGS: The cardiomediastinal contours are normal. The lungs are clear. Pulmonary vasculature is normal. No consolidation, pleural effusion, or pneumothorax. No acute osseous abnormalities are seen. IMPRESSION: Negative radiographs of the chest. Electronically Signed   By: Narda Rutherford M.D.   On: 03/12/2023 19:52   US Abdomen Limited RUQ (LIVER/GB)  Result Date: 03/12/2023 CLINICAL DATA:  Right upper quadrant pain, nausea and vomiting. EXAM: ULTRASOUND ABDOMEN LIMITED RIGHT UPPER QUADRANT COMPARISON:  06/06/2013, CT 03/11/2023 and MRI abdomen 11/11/2023 FINDINGS: Gallbladder: No gallstones or wall thickening visualized. No sonographic Murphy sign noted by sonographer. Common bile duct: Not visualized. Liver: Multiple known large hemangiomas. Caudate lobe mass measures 6 x 6.9 x 8.1 cm. Right lobe mass  measures 13.1 x 5.4 x 13.1 cm. Portal vein is patent on color Doppler imaging with normal direction of blood flow towards the liver. Other: No free fluid. IMPRESSION: 1. No acute hepatobiliary disease. 2. Multiple known large hemangiomas. Electronically Signed   By: Elberta Fortis M.D.   On: 03/12/2023 13:20    Assessment & Plan:   Problem List Items Addressed This Visit       Other   Abdominal pain - Primary    RUQ abd pain - resolved after 2 days. He was on Wegovy x 1-2 mo prior. No pain  - using Wegovy ok. Resolved --  ?etiology (likely a food poisoning - started 20 min after eating a hot dog; n/v) OK to cont Wegovy - increase the dose in 2 wks if ok       H/O gastric sleeve    Cont to use [facility] w/caution. On PO iron      Iron deficiency    Mild - likely due to gastric sleeve - on po iron      Obesity (BMI 30-39.9)    Cont to use [facility] w/caution. Increase the dose in 2 wks if ok      Relevant Medications   Semaglutide-Weight Management (WEGOVY) 1 MG/0.5ML SOAJ      Meds ordered this encounter  Medications   Semaglutide-Weight Management (WEGOVY) 1 MG/0.5ML SOAJ    Sig: Inject 1 mg into the skin once a week.    Dispense:  2 mL    Refill:  3   hyoscyamine (LEVSIN) 0.125 MG tablet    Sig: Take 1-2 tablets (0.125-0.25 mg total) by mouth every 4 (four) hours as needed for cramping.    Dispense:  100 tablet    Refill:  1      Follow-up: Return in about 3 months (around 07/06/2023) for a follow-up visit.  Sonda Primes, MD

## 2023-04-06 NOTE — Assessment & Plan Note (Signed)
Cont to use [facility] w/caution. On PO iron

## 2023-04-06 NOTE — Assessment & Plan Note (Addendum)
RUQ abd pain - resolved after 2 days. He was on Wegovy x 1-2 mo prior. No pain  - using Wegovy ok. Resolved --  ?etiology (likely a food poisoning - started 20 min after eating a hot dog; n/v) OK to cont Wegovy - increase the dose in 2 wks if ok

## 2023-04-26 ENCOUNTER — Encounter: Payer: Self-pay | Admitting: Internal Medicine

## 2023-05-01 ENCOUNTER — Other Ambulatory Visit: Payer: Self-pay | Admitting: Internal Medicine

## 2023-05-01 DIAGNOSIS — L731 Pseudofolliculitis barbae: Secondary | ICD-10-CM | POA: Insufficient documentation

## 2023-05-01 MED ORDER — WEGOVY 0.5 MG/0.5ML ~~LOC~~ SOAJ: 0.5000 mg | SUBCUTANEOUS | 3 refills | Status: DC

## 2023-07-13 ENCOUNTER — Ambulatory Visit: Payer: Commercial Managed Care - PPO | Admitting: Dermatology

## 2023-07-13 DIAGNOSIS — L731 Pseudofolliculitis barbae: Secondary | ICD-10-CM

## 2023-07-13 DIAGNOSIS — L249 Irritant contact dermatitis, unspecified cause: Secondary | ICD-10-CM

## 2023-07-13 DIAGNOSIS — L309 Dermatitis, unspecified: Secondary | ICD-10-CM

## 2023-07-13 MED ORDER — HYDROCORTISONE 2.5 % EX CREA: TOPICAL_CREAM | Freq: Two times a day (BID) | CUTANEOUS | 1 refills | Status: DC | PRN

## 2023-07-13 MED ORDER — DOXYCYCLINE HYCLATE 100 MG PO TABS: 100.0000 mg | ORAL_TABLET | Freq: Every day | ORAL | 4 refills | Status: DC

## 2023-07-13 NOTE — Progress Notes (Signed)
   New Patient Visit   Subjective  Nicholas Stevens is a 52 y.o. male who presents for the following: Pseudofolliculitis barbae   Patient states he  has face and underneath located at the face that he  would like to have examined. Patient reports the areas have been there for  4  month(s). He  reports the areas itchy and burning bothersome. He rates the itchy sensation 7 out of 10. He states that the areas have not spread. Patient reports has not previously been treated for these areas but he has tried some prescription Nystatin Cream from his wife and that seems to help with his sxs. Currently the last shave was on yesterday. Patient reports he currently uses Art of shaving creams, and razor blades (Reports he currently uses the same razor about 5 times prior to disregarding). He also reports, he uses the same razor to shave his head. After he shave he applies skin tight (which he has used for the past 20-35 years). Patient reports he never had acne prone skin but feels his skin is naturally oily. Patient denies Hx of bx. Patient denies family history of skin cancer(s).    The following portions of the chart were reviewed this encounter and updated as appropriate: medications, allergies, medical history  Review of Systems:  No other skin or systemic complaints except as noted in HPI or Assessment and Plan.  Objective  Well appearing patient in no apparent distress; mood and affect are within normal limits.  A focused examination was performed of the following areas: Face and neck  Relevant exam findings are noted in the Assessment and Plan.         Assessment & Plan   IRRITANT CONTACT DERMATITIS Exam: Scaly pink papules and/or plaques  Treatment Plan: - Hydrocortisone 2.5% apply 2 times daily for 1 week - Advise to replace razor blade 3 times then discard - Recommended trying Aveno Shaving Gel  Pseudofolliculitis barbae (PFB) Exam:  keloidal papules on neck and  jawline  Treatment Plan: - Epiduo Cream Only use (M,W,F) - Mix with Cerave Lotion - Doxycycline 100mg  every day with HEAVY Meal for 1 month, afterward for flares you will take doxycylinc   Pseudofolliculitis barbae  Related Medications hydrocortisone 2.5 % cream Apply topically 2 (two) times daily as needed (Rash).  doxycycline (VIBRA-TABS) 100 MG tablet Take 1 tablet (100 mg total) by mouth daily. TAKE WITH HEAVY FOOD    Return in about 3 months (around 10/13/2023) for PFB & Contact Derm F/U.  Documentation: I have reviewed the above documentation for accuracy and completeness, and I agree with the above.  Stasia Cavalier, am acting as scribe for Langston Reusing, DO.  Langston Reusing, DO

## 2023-07-13 NOTE — Patient Instructions (Addendum)
Thank you for visiting my office today. I appreciate your commitment to improving your skin health and addressing the concerns with your razor bumps. Here is a summary of the key instructions we discussed:  - Medications Prescribed:   - Hydrocortisone 2.5% Cream: Apply twice daily for the next week to treat active inflammation.   - Epiduo Cream (Benzoyl Peroxide and Adapalene): Apply three nights a week (Monday, Wednesday, Friday). Mix with CeraVe lotion to prevent dryness.   - Optional Doxycycline: Take orally with dinner once daily for a month to help from the inside out. May be repeated for a week during flare-ups.  - Shaving Recommendations:   - Use a [employer] Mach 5 Fusion razor for sensitive skin. Replace the blade every two to three days.   - Use Aveeno shaving gel for a gentle and soothing shave.  - Follow-Up Appointment:   - Please return for a follow-up in three months to assess progress and make any necessary adjustments to your treatment plan.  Please follow these instructions carefully and do not hesitate to contact our office if you have any questions or concerns.    Due to recent changes in healthcare laws, you may see results of your pathology and/or laboratory studies on MyChart before the doctors have had a chance to review them. We understand that in some cases there may be results that are confusing or concerning to you. Please understand that not all results are received at the same time and often the doctors may need to interpret multiple results in order to provide you with the best plan of care or course of treatment. Therefore, we ask that you please give [place] 2 business days to thoroughly review all your results before contacting the office for clarification. Should we see a critical lab result, you will be contacted sooner.   If You Need Anything After Your Visit  If you have any questions or concerns for your doctor, please call our main line at [phone] If no one  answers, please leave a voicemail as directed and we will return your call as soon as possible. Messages left after 4 pm will be answered the following business day.   You may also send [place] a message via MyChart. We typically respond to MyChart messages within 1-2 business days.  For prescription refills, please ask your pharmacy to contact our office. Our fax number is [phone].  If you have an urgent issue when the clinic is closed that cannot wait until the next business day, you can page your doctor at the number below.    Please note that while we do our best to be available for urgent issues outside of office hours, we are not available 24/7.   If you have an urgent issue and are unable to reach [place], you may choose to seek medical care at your doctor's office, retail clinic, urgent care center, or emergency room.  If you have a medical emergency, please immediately call 911 or go to the emergency department. In the event of inclement weather, please call our main line at [phone] for an update on the status of any delays or closures.  Dermatology Medication Tips: Please keep the boxes that topical medications come in in order to help keep track of the instructions about where and how to use these. Pharmacies typically print the medication instructions only on the boxes and not directly on the medication tubes.   If your medication is too expensive, please contact our office at  [phone] or send [place] a message through MyChart.   We are unable to tell what your co-pay for medications will be in advance as this is different depending on your insurance coverage. However, we may be able to find a substitute medication at lower cost or fill out paperwork to get insurance to cover a needed medication.   If a prior authorization is required to get your medication covered by your insurance company, please allow [place] 1-2 business days to complete this process.  Drug prices often vary  depending on where the prescription is filled and some pharmacies may offer cheaper prices.  The website www.goodrx.com contains coupons for medications through different pharmacies. The prices here do not account for what the cost may be with help from insurance (it may be cheaper with your insurance), but the website can give you the price if you did not use any insurance.  - You can print the associated coupon and take it with your prescription to the pharmacy.  - You may also stop by our office during regular business hours and pick up a GoodRx coupon card.  - If you need your prescription sent electronically to a different pharmacy, notify our office through [facility] or by phone at [phone]

## 2023-07-15 ENCOUNTER — Encounter: Payer: Self-pay | Admitting: Dermatology

## 2023-07-17 MED ORDER — ADAPALENE-BENZOYL PEROXIDE 0.1-2.5 % EX GEL: 1.0000 "application " | Freq: Every day | CUTANEOUS | 3 refills | Status: DC

## 2023-07-17 NOTE — Telephone Encounter (Signed)
Hi Hollie,  It looks like [place] forgot to send the rx for epiduo, can you please add the order to the note and let pt know we sent it?  Thanks!!

## 2023-07-23 ENCOUNTER — Encounter: Payer: Self-pay | Admitting: Dermatology

## 2023-08-01 ENCOUNTER — Encounter: Payer: Self-pay | Admitting: Internal Medicine

## 2023-08-08 ENCOUNTER — Other Ambulatory Visit: Payer: Self-pay | Admitting: Internal Medicine

## 2023-08-08 DIAGNOSIS — G4700 Insomnia, unspecified: Secondary | ICD-10-CM

## 2023-08-08 MED ORDER — ZOLPIDEM TARTRATE 10 MG PO TABS: ORAL_TABLET | ORAL | 1 refills | Status: DC

## 2023-08-17 ENCOUNTER — Other Ambulatory Visit: Payer: Self-pay | Admitting: Internal Medicine

## 2023-09-20 ENCOUNTER — Other Ambulatory Visit (HOSPITAL_COMMUNITY): Payer: Self-pay

## 2023-09-21 ENCOUNTER — Other Ambulatory Visit (HOSPITAL_COMMUNITY): Payer: Self-pay

## 2023-09-21 ENCOUNTER — Telehealth: Payer: Self-pay

## 2023-09-21 NOTE — Telephone Encounter (Signed)
Pharmacy Patient Advocate Encounter   Received notification from Physician's Office that prior authorization for [facility] is required/requested.   Insurance verification completed.   The patient is insured through [facility] [facility] [employer] .   Per test claim: PA required; PA submitted to [facility] via CoverMyMeds Key/confirmation #/EOC Key: [id number]   Status is pending

## 2023-09-22 ENCOUNTER — Other Ambulatory Visit (HOSPITAL_COMMUNITY): Payer: Self-pay

## 2023-09-22 NOTE — Telephone Encounter (Signed)
Pharmacy Patient Advocate Encounter  Received notification from [facility] that Prior Authorization for [facility] 0.5MG /0.5ML auto-injectors has been APPROVED from 9.26.24 to 3.26.25. Ran test claim, Copay is $24.99. This test claim was processed through [facility]- copay amounts may vary at other pharmacies due to pharmacy/plan contracts, or as the patient moves through the different stages of their insurance plan.   PA #/Case ID/Reference #: (Key: [id number])

## 2023-09-25 ENCOUNTER — Encounter: Payer: Self-pay | Admitting: Radiology

## 2023-09-26 ENCOUNTER — Other Ambulatory Visit (HOSPITAL_COMMUNITY): Payer: Self-pay

## 2023-09-29 ENCOUNTER — Other Ambulatory Visit: Payer: Self-pay

## 2023-09-29 MED ORDER — WEGOVY 0.5 MG/0.5ML ~~LOC~~ SOAJ: 0.5000 mg | SUBCUTANEOUS | 3 refills | Status: DC

## 2023-10-12 ENCOUNTER — Ambulatory Visit: Payer: Commercial Managed Care - PPO | Admitting: Dermatology

## 2023-10-12 ENCOUNTER — Encounter: Payer: Self-pay | Admitting: Dermatology

## 2023-10-12 VITALS — BP 134/91 | HR 83

## 2023-10-12 DIAGNOSIS — L731 Pseudofolliculitis barbae: Secondary | ICD-10-CM | POA: Diagnosis not present

## 2023-10-12 DIAGNOSIS — B078 Other viral warts: Secondary | ICD-10-CM | POA: Diagnosis not present

## 2023-10-12 MED ORDER — DOXYCYCLINE HYCLATE 100 MG PO TABS: 100.0000 mg | ORAL_TABLET | Freq: Every day | ORAL | 4 refills | Status: DC

## 2023-10-12 MED ORDER — ADAPALENE-BENZOYL PEROXIDE 0.1-2.5 % EX GEL: 1.0000 "application " | Freq: Every day | CUTANEOUS | 3 refills | Status: DC

## 2023-10-12 MED ORDER — PIMECROLIMUS 1 % EX CREA: TOPICAL_CREAM | Freq: Two times a day (BID) | CUTANEOUS | 0 refills | Status: DC

## 2023-10-12 NOTE — Progress Notes (Signed)
   Follow-Up Visit   Subjective  Nicholas Stevens is a 52 y.o. male who presents for the following: PFB and Contact Dermatitis  Patient present today for follow up visit for PFB and Contact Dermatitis. Patient was last evaluated on 07/13/23. Patient reports sxs are better. Patient denies medication changes.  The following portions of the chart were reviewed this encounter and updated as appropriate: medications, allergies, medical history  Review of Systems:  No other skin or systemic complaints except as noted in HPI or Assessment and Plan.  Objective  Well appearing patient in no apparent distress; mood and affect are within normal limits.  A focused examination was performed of the following areas: Neck  Relevant exam findings are noted in the Assessment and Plan.  Right Buccal Cheek x2, right upper eyelid  and left upper eyelid (6) Verrucous papules        Assessment & Plan   Pseudofolliculitis barbae (PFB) Exam:  keloidal papules on neck and jawline   Treatment plan:  - Doxycycline 100 mg daily for 3 months with heavy meal - We will prescribe Pimecrolimus Daily - Continue applying EPIDUO every other night     WART Exam: verrucous flesh colored papule(s) Filiform Wart (7) (2 R eyelids, 1 left eyelid 2 right cheek)  Counseling Discussed viral / HPV (Human Papilloma Virus) etiology and risk of spread /infectivity to other areas of body as well as to other people.  Multiple treatments and methods may be required to clear warts and it is possible treatment may not be successful.  Treatment risks include discoloration; scarring and there is still potential for wart recurrence.  Treatment Plan: -   Pseudofolliculitis barbae  Related Medications hydrocortisone 2.5 % cream Apply topically 2 (two) times daily as needed (Rash).  Adapalene-Benzoyl Peroxide (EPIDUO) 0.1-2.5 % gel Apply 1 application  topically at bedtime. Apply to affected area 3 nights per week  (Monday, Wednesday, Friday)  pimecrolimus (ELIDEL) 1 % cream Apply topically 2 (two) times daily.  doxycycline (VIBRA-TABS) 100 MG tablet Take 1 tablet (100 mg total) by mouth daily. TAKE WITH HEAVY FOOD  Other viral warts (6) Right Buccal Cheek x2, right upper eyelid  and left upper eyelid  Destruction of lesion - Right Buccal Cheek x2, right upper eyelid  and left upper eyelid (6) Complexity: simple   Destruction method: cryotherapy   Informed consent: discussed and consent obtained   Timeout:  patient name, date of birth, surgical site, and procedure verified Lesion destroyed using liquid nitrogen: Yes   Region frozen until ice ball extended beyond lesion: Yes   Outcome: patient tolerated procedure well with no complications   Post-procedure details: wound care instructions given      Return in about 3 months (around 01/12/2024).    Documentation: I have reviewed the above documentation for accuracy and completeness, and I agree with the above.  Stasia Cavalier, am acting as scribe for Langston Reusing, DO.  Langston Reusing, DO

## 2023-10-12 NOTE — Patient Instructions (Addendum)
Hello Mr. Carton,  Thank you for visiting my office today. Your dedication to improving your health and managing your pseudofolliculitis barbae is greatly appreciated. Here is a summary of the key instructions from today's consultation:  - Medications:   - Epiduo with Lotion: Continue using Epiduo mixed with lotion every other night.   - Pimecrolimus Cream: Transition from hydrocortisone cream to Pimecrolimus cream. Apply every morning.   - Doxycycline: Start taking Doxycycline 100 mg once daily with food.  - Lifestyle Adjustments:   - Aquaphor: Apply Aquaphor as follow-up care for the treated warts.  - Follow-Up Care:   - Wart Treatment: The warts treated with liquid nitrogen today should fall off in about 2-3 weeks. We will assess and possibly retreat any remaining warts at your next visit.   - Next Appointment: Please schedule a follow-up appointment in 3 months to monitor progress and adjust treatment as necessary.  Thank you for your cooperation and adherence to the treatment plan. Looking forward to seeing the improvements at your next appointment.  Best regards,  Dr. Langston Reusing,  Dermatology      Cryotherapy Aftercare  Wash gently with soap and water everyday.   Apply Vaseline and Band-Aid daily until healed.   Important Information   Due to recent changes in healthcare laws, you may see results of your pathology and/or laboratory studies on MyChart before the doctors have had a chance to review them. We understand that in some cases there may be results that are confusing or concerning to you. Please understand that not all results are received at the same time and often the doctors may need to interpret multiple results in order to provide you with the best plan of care or course of treatment. Therefore, we ask that you please give [place] 2 business days to thoroughly review all your results before contacting the office for clarification. Should we see a critical lab  result, you will be contacted sooner.     If You Need Anything After Your Visit   If you have any questions or concerns for your doctor, please call our main line at [phone]. If no one answers, please leave a voicemail as directed and we will return your call as soon as possible. Messages left after 4 pm will be answered the following business day.    You may also send [place] a message via MyChart. We typically respond to MyChart messages within 1-2 business days.  For prescription refills, please ask your pharmacy to contact our office. Our fax number is [phone].  If you have an urgent issue when the clinic is closed that cannot wait until the next business day, you can page your doctor at the number below.     Please note that while we do our best to be available for urgent issues outside of office hours, we are not available 24/7.    If you have an urgent issue and are unable to reach [place], you may choose to seek medical care at your doctor's office, retail clinic, urgent care center, or emergency room.   If you have a medical emergency, please immediately call 911 or go to the emergency department. In the event of inclement weather, please call our main line at [phone] for an update on the status of any delays or closures.  Dermatology Medication Tips: Please keep the boxes that topical medications come in in order to help keep track of the instructions about where and how to use these. Pharmacies typically  print the medication instructions only on the boxes and not directly on the medication tubes.   If your medication is too expensive, please contact our office at [phone] or send [place] a message through MyChart.    We are unable to tell what your co-pay for medications will be in advance as this is different depending on your insurance coverage. However, we may be able to find a substitute medication at lower cost or fill out paperwork to get insurance to cover a needed  medication.    If a prior authorization is required to get your medication covered by your insurance company, please allow [place] 1-2 business days to complete this process.   Drug prices often vary depending on where the prescription is filled and some pharmacies may offer cheaper prices.   The website www.goodrx.com contains coupons for medications through different pharmacies. The prices here do not account for what the cost may be with help from insurance (it may be cheaper with your insurance), but the website can give you the price if you did not use any insurance.  - You can print the associated coupon and take it with your prescription to the pharmacy.  - You may also stop by our office during regular business hours and pick up a GoodRx coupon card.  - If you need your prescription sent electronically to a different pharmacy, notify our office through [facility] or by phone at [phone]

## 2023-12-05 ENCOUNTER — Ambulatory Visit (HOSPITAL_BASED_OUTPATIENT_CLINIC_OR_DEPARTMENT_OTHER): Payer: Commercial Managed Care - PPO | Admitting: Pulmonary Disease

## 2023-12-05 ENCOUNTER — Encounter (HOSPITAL_BASED_OUTPATIENT_CLINIC_OR_DEPARTMENT_OTHER): Payer: Self-pay | Admitting: Pulmonary Disease

## 2023-12-05 VITALS — BP 118/86 | HR 100 | Resp 16 | Ht 74.5 in | Wt 266.4 lb

## 2023-12-05 DIAGNOSIS — G4733 Obstructive sleep apnea (adult) (pediatric): Secondary | ICD-10-CM

## 2023-12-05 NOTE — Progress Notes (Addendum)
Subjective:    Patient ID: Nicholas Stevens, male    DOB: 1971/06/08, 52 y.o.   MRN: [id number]  HPI  52 yo [place] [employer] for [employer] He retired after 30 years with the [employer] and now works for the [place] [employer]  -on CPAP since 2008  PMH -hypertension Gastric sleeve surgery 2014  Chief Complaint  Patient presents with   Acute Visit    Chest burns when using CPAP.This is the only time he has it. Been going on for about 2 months. Took him forever to figure out what it was.    After his initial office visit with [place] in 07/2022   we got him a new auto CPAP 8 to 12 cm  Discussed the use of AI scribe software for clinical note transcription with the patient, who gave verbal consent to proceed.  History of Present Illness   The patient, with a history of sleep apnea, presents with a new onset of chest discomfort and pressure associated with CPAP use. The discomfort, described as a 'burning sensation and pressure,' has been occurring intermittently for the past two months, often waking the patient from sleep. The patient denies any known heart issues and has not experienced similar symptoms in the past. The discomfort subsides after removing the CPAP mask and sitting up for a period of time. The patient also reports a reduction in symptoms when not using the CPAP machine for several nights while traveling.  In addition to the chest discomfort, the patient reports a sensation of mucus buildup in the throat in the mornings, which he attributes to the CPAP machine's humidity settings. The patient notes that coughing seems to alleviate the chest discomfort.  The patient is also currently on [facility] for an unspecified condition and Doxycycline for a rash, both of which were started approximately seven months ago. The patient denies any changes in medication dosage or new medications.      CPAP download shows excellent control of events on auto settings 8 to 12 cm with average  pressure of 10.5 and max pressure of 11 cm.  He is very compliant, leak is minimal   Significant tests/ events reviewed NPSG 6/ 2008 w/ AHI=53 & desat to 76%  Review of Systems neg for any significant sore throat, dysphagia, itching, sneezing, nasal congestion or excess/ purulent secretions, fever, chills, sweats, unintended wt loss, pleuritic or exertional cp, hempoptysis, orthopnea pnd or change in chronic leg swelling. Also denies presyncope, palpitations, heartburn, abdominal pain, nausea, vomiting, diarrhea or change in bowel or urinary habits, dysuria,hematuria, rash, arthralgias, visual complaints, headache, numbness weakness or ataxia.     Objective:   Physical Exam  Gen. Pleasant, obese, in no distress ENT - no lesions, no post nasal drip Neck: No JVD, no thyromegaly, no carotid bruits Lungs: no use of accessory muscles, no dullness to percussion, decreased without rales or rhonchi  Cardiovascular: Rhythm regular, heart sounds  normal, no murmurs or gallops, no peripheral edema Musculoskeletal: No deformities, no cyanosis or clubbing , no tremors        Assessment & Plan:    Assessment and Plan    OSA on CPAP Chest Pain Intermittent burning sensation and pressure in the chest, primarily nocturnal and associated with CPAP use for two months. Differential includes CPAP-related pressure issues, medication side effects (Wegovy, doxycycline), and potential GI effects. No history of heartburn or cardiac issues. Symptoms alleviated by coughing, suggesting mucus involvement. Discussed risks of high CPAP pressure, including  bloating and gas. Advised that lowering pressure and humidity may alleviate symptoms. Discussed monitoring potential triggers such as Wegovy, food intake, and doxycycline. - Lower CPAP pressure range to 5-11 cm H2O - Lower CPAP humidity setting by 5 points - Monitor symptoms and keep a diary of potential triggers [facility], food intake, doxycycline) - Follow up if  symptoms persist  Mucus Buildup Reports morning mucus buildup, potentially related to CPAP humidity. Symptoms include throat clearing for the first couple of hours after waking. Discussed that CPAP humidity can increase mucus production. - Lower CPAP humidity setting by 5 points - Monitor for changes in mucus production  Follow-up - Follow up if symptoms persist - Consider repeating sleep study if symptoms continue.

## 2023-12-05 NOTE — Patient Instructions (Signed)
x change CPAP to auto settings 5-11 centimeters  Lower humidity on the machine  Alternative causes could be reflux or related to medication such as Wegovy/doxycycline

## 2023-12-15 ENCOUNTER — Other Ambulatory Visit: Payer: Self-pay

## 2023-12-15 ENCOUNTER — Emergency Department (HOSPITAL_COMMUNITY): Payer: Commercial Managed Care - PPO

## 2023-12-15 ENCOUNTER — Emergency Department (HOSPITAL_BASED_OUTPATIENT_CLINIC_OR_DEPARTMENT_OTHER): Payer: Commercial Managed Care - PPO

## 2023-12-15 ENCOUNTER — Inpatient Hospital Stay (HOSPITAL_BASED_OUTPATIENT_CLINIC_OR_DEPARTMENT_OTHER)
Admission: EM | Admit: 2023-12-15 | Discharge: 2023-12-26 | DRG: 025 | Disposition: A | Discharge status: Rehabilitation Facility | Payer: Commercial Managed Care - PPO | Attending: Internal Medicine | Admitting: Internal Medicine

## 2023-12-15 ENCOUNTER — Inpatient Hospital Stay (HOSPITAL_COMMUNITY): Payer: Commercial Managed Care - PPO

## 2023-12-15 DIAGNOSIS — I6789 Other cerebrovascular disease: Secondary | ICD-10-CM | POA: Diagnosis present

## 2023-12-15 DIAGNOSIS — R739 Hyperglycemia, unspecified: Secondary | ICD-10-CM | POA: Diagnosis not present

## 2023-12-15 DIAGNOSIS — Z9884 Bariatric surgery status: Secondary | ICD-10-CM

## 2023-12-15 DIAGNOSIS — Z6834 Body mass index (BMI) 34.0-34.9, adult: Secondary | ICD-10-CM | POA: Diagnosis not present

## 2023-12-15 DIAGNOSIS — W010XXA Fall on same level from slipping, tripping and stumbling without subsequent striking against object, initial encounter: Secondary | ICD-10-CM | POA: Diagnosis present

## 2023-12-15 DIAGNOSIS — E876 Hypokalemia: Secondary | ICD-10-CM | POA: Diagnosis present

## 2023-12-15 DIAGNOSIS — M79605 Pain in left leg: Secondary | ICD-10-CM | POA: Diagnosis not present

## 2023-12-15 DIAGNOSIS — Z8249 Family history of ischemic heart disease and other diseases of the circulatory system: Secondary | ICD-10-CM

## 2023-12-15 DIAGNOSIS — I10 Essential (primary) hypertension: Secondary | ICD-10-CM | POA: Diagnosis present

## 2023-12-15 DIAGNOSIS — G935 Compression of brain: Secondary | ICD-10-CM | POA: Diagnosis present

## 2023-12-15 DIAGNOSIS — Z79899 Other long term (current) drug therapy: Secondary | ICD-10-CM

## 2023-12-15 DIAGNOSIS — Z7985 Long-term (current) use of injectable non-insulin antidiabetic drugs: Secondary | ICD-10-CM

## 2023-12-15 DIAGNOSIS — E669 Obesity, unspecified: Secondary | ICD-10-CM | POA: Diagnosis present

## 2023-12-15 DIAGNOSIS — D496 Neoplasm of unspecified behavior of brain: Secondary | ICD-10-CM | POA: Diagnosis present

## 2023-12-15 DIAGNOSIS — E611 Iron deficiency: Secondary | ICD-10-CM | POA: Diagnosis present

## 2023-12-15 DIAGNOSIS — G8104 Flaccid hemiplegia affecting left nondominant side: Secondary | ICD-10-CM | POA: Diagnosis present

## 2023-12-15 DIAGNOSIS — R0689 Other abnormalities of breathing: Secondary | ICD-10-CM | POA: Diagnosis not present

## 2023-12-15 DIAGNOSIS — R34 Anuria and oliguria: Secondary | ICD-10-CM | POA: Diagnosis not present

## 2023-12-15 DIAGNOSIS — M7989 Other specified soft tissue disorders: Secondary | ICD-10-CM | POA: Diagnosis not present

## 2023-12-15 DIAGNOSIS — T380X5A Adverse effect of glucocorticoids and synthetic analogues, initial encounter: Secondary | ICD-10-CM | POA: Diagnosis not present

## 2023-12-15 DIAGNOSIS — D72829 Elevated white blood cell count, unspecified: Secondary | ICD-10-CM | POA: Diagnosis present

## 2023-12-15 DIAGNOSIS — D751 Secondary polycythemia: Secondary | ICD-10-CM | POA: Diagnosis present

## 2023-12-15 DIAGNOSIS — R531 Weakness: Secondary | ICD-10-CM | POA: Diagnosis not present

## 2023-12-15 DIAGNOSIS — I629 Nontraumatic intracranial hemorrhage, unspecified: Principal | ICD-10-CM

## 2023-12-15 DIAGNOSIS — R339 Retention of urine, unspecified: Secondary | ICD-10-CM | POA: Diagnosis not present

## 2023-12-15 DIAGNOSIS — K5900 Constipation, unspecified: Secondary | ICD-10-CM | POA: Diagnosis present

## 2023-12-15 DIAGNOSIS — G936 Cerebral edema: Secondary | ICD-10-CM | POA: Diagnosis present

## 2023-12-15 DIAGNOSIS — C719 Malignant neoplasm of brain, unspecified: Secondary | ICD-10-CM | POA: Diagnosis not present

## 2023-12-15 DIAGNOSIS — I82452 Acute embolism and thrombosis of left peroneal vein: Secondary | ICD-10-CM | POA: Diagnosis not present

## 2023-12-15 DIAGNOSIS — E66811 Obesity, class 1: Secondary | ICD-10-CM | POA: Diagnosis present

## 2023-12-15 DIAGNOSIS — G4733 Obstructive sleep apnea (adult) (pediatric): Secondary | ICD-10-CM | POA: Diagnosis present

## 2023-12-15 DIAGNOSIS — I96 Gangrene, not elsewhere classified: Secondary | ICD-10-CM | POA: Diagnosis present

## 2023-12-15 DIAGNOSIS — R29810 Facial weakness: Secondary | ICD-10-CM | POA: Diagnosis present

## 2023-12-15 DIAGNOSIS — Z48811 Encounter for surgical aftercare following surgery on the nervous system: Secondary | ICD-10-CM | POA: Diagnosis not present

## 2023-12-15 DIAGNOSIS — G9389 Other specified disorders of brain: Secondary | ICD-10-CM

## 2023-12-15 DIAGNOSIS — C711 Malignant neoplasm of frontal lobe: Principal | ICD-10-CM | POA: Diagnosis present

## 2023-12-15 DIAGNOSIS — I612 Nontraumatic intracerebral hemorrhage in hemisphere, unspecified: Secondary | ICD-10-CM | POA: Diagnosis present

## 2023-12-15 DIAGNOSIS — R066 Hiccough: Secondary | ICD-10-CM | POA: Diagnosis present

## 2023-12-15 LAB — MAGNESIUM: Magnesium: 1.9 mg/dL (ref 1.7–2.4)

## 2023-12-15 LAB — COMPREHENSIVE METABOLIC PANEL
ALT: 22 U/L (ref 0–44)
AST: 17 U/L (ref 15–41)
Albumin: 4.5 g/dL (ref 3.5–5.0)
Alkaline Phosphatase: 49 U/L (ref 38–126)
Anion gap: 8 (ref 5–15)
BUN: 9 mg/dL (ref 6–20)
CO2: 30 mmol/L (ref 22–32)
Calcium: 9.9 mg/dL (ref 8.9–10.3)
Chloride: 98 mmol/L (ref 98–111)
Creatinine, Ser: 1.05 mg/dL (ref 0.61–1.24)
GFR, Estimated: >60 mL/min (ref >60)
Glucose, Bld: 128 mg/dL — ABNORMAL HIGH (ref 70–99)
Potassium: 3.5 mmol/L (ref 3.5–5.1)
Sodium: 136 mmol/L (ref 135–145)
Total Bilirubin: 1 mg/dL (ref <1.2)
Total Protein: 8.3 g/dL — ABNORMAL HIGH (ref 6.5–8.1)

## 2023-12-15 LAB — HEMOGLOBIN A1C
Hgb A1c MFr Bld: 5.4 % (ref 4.8–5.6)
Mean Plasma Glucose: 108.28 mg/dL

## 2023-12-15 LAB — CBC WITH DIFFERENTIAL/PLATELET
Abs Immature Granulocytes: 0.02 10*3/uL (ref 0.00–0.07)
Basophils Absolute: 0 10*3/uL (ref 0.0–0.1)
Basophils Relative: 0 %
Eosinophils Absolute: 0.1 10*3/uL (ref 0.0–0.5)
Eosinophils Relative: 2 %
HCT: 53 % — ABNORMAL HIGH (ref 39.0–52.0)
Hemoglobin: 17.3 g/dL — ABNORMAL HIGH (ref 13.0–17.0)
Immature Granulocytes: 0 %
Lymphocytes Relative: 19 %
Lymphs Abs: 1 10*3/uL (ref 0.7–4.0)
MCH: 27 pg (ref 26.0–34.0)
MCHC: 32.6 g/dL (ref 30.0–36.0)
MCV: 82.8 fL (ref 80.0–100.0)
Monocytes Absolute: 0.4 10*3/uL (ref 0.1–1.0)
Monocytes Relative: 8 %
Neutro Abs: 3.6 10*3/uL (ref 1.7–7.7)
Neutrophils Relative %: 71 %
Platelets: 247 10*3/uL (ref 150–400)
RBC: 6.4 MIL/uL — ABNORMAL HIGH (ref 4.22–5.81)
RDW: 17.7 % — ABNORMAL HIGH (ref 11.5–15.5)
WBC: 5.1 10*3/uL (ref 4.0–10.5)
nRBC: 0 % (ref 0.0–0.2)

## 2023-12-15 LAB — CBG MONITORING, ED
Glucose-Capillary: 170 mg/dL — ABNORMAL HIGH (ref 70–99)
Glucose-Capillary: 148 mg/dL — ABNORMAL HIGH (ref 70–99)

## 2023-12-15 LAB — PROTIME-INR
INR: 1 (ref 0.8–1.2)
Prothrombin Time: 13.6 s (ref 11.4–15.2)

## 2023-12-15 LAB — URINALYSIS, W/ REFLEX TO CULTURE (INFECTION SUSPECTED)
Bacteria, UA: NONE SEEN
Bilirubin Urine: NEGATIVE
Glucose, UA: NEGATIVE mg/dL
Hgb urine dipstick: NEGATIVE
Ketones, ur: NEGATIVE mg/dL
Leukocytes,Ua: NEGATIVE
Nitrite: NEGATIVE
Protein, ur: NEGATIVE mg/dL
Specific Gravity, Urine: 1.019 (ref 1.005–1.030)
pH: 7.5 (ref 5.0–8.0)

## 2023-12-15 LAB — GLUCOSE, CAPILLARY
Glucose-Capillary: 149 mg/dL — ABNORMAL HIGH (ref 70–99)
Glucose-Capillary: 143 mg/dL — ABNORMAL HIGH (ref 70–99)

## 2023-12-15 LAB — APTT: aPTT: 28 s (ref 24–36)

## 2023-12-15 LAB — RAPID URINE DRUG SCREEN, HOSP PERFORMED
Amphetamines: NOT DETECTED
Barbiturates: NOT DETECTED
Benzodiazepines: NOT DETECTED
Cocaine: NOT DETECTED
Opiates: NOT DETECTED
Tetrahydrocannabinol: NOT DETECTED

## 2023-12-15 LAB — PHOSPHORUS: Phosphorus: 1.5 mg/dL — ABNORMAL LOW (ref 2.5–4.6)

## 2023-12-15 LAB — MRSA NEXT GEN BY PCR, NASAL: MRSA by PCR Next Gen: NOT DETECTED

## 2023-12-15 LAB — ETHANOL: Alcohol, Ethyl (B): <10 mg/dL (ref <10)

## 2023-12-15 MED ORDER — CHLORHEXIDINE GLUCONATE CLOTH 2 % EX PADS
6.0000 | MEDICATED_PAD | Freq: Every day | CUTANEOUS | Status: DC
  Administered 2023-12-15 – 2023-12-21 (×7): 6 via TOPICAL

## 2023-12-15 MED ORDER — ZOLPIDEM TARTRATE 5 MG PO TABS
5.0000 mg | ORAL_TABLET | Freq: Once | ORAL | Status: AC
  Administered 2023-12-15: 5 mg via ORAL
  Filled 2023-12-15: qty 1

## 2023-12-15 MED ORDER — DOCUSATE SODIUM 100 MG PO CAPS: 100.0000 mg | ORAL_CAPSULE | Freq: Two times a day (BID) | ORAL | Status: DC | PRN

## 2023-12-15 MED ORDER — FAMOTIDINE IN NACL 20-0.9 MG/50ML-% IV SOLN
20.0000 mg | Freq: Two times a day (BID) | INTRAVENOUS | Status: DC
Start: 2023-12-15 — End: 2023-12-15
  Administered 2023-12-15: 20 mg via INTRAVENOUS
  Filled 2023-12-15: qty 50

## 2023-12-15 MED ORDER — DEXAMETHASONE SODIUM PHOSPHATE 10 MG/ML IJ SOLN
6.0000 mg | Freq: Four times a day (QID) | INTRAMUSCULAR | Status: DC
  Administered 2023-12-15 – 2023-12-18 (×10): 6 mg via INTRAVENOUS
  Filled 2023-12-15 (×9): qty 1

## 2023-12-15 MED ORDER — DEXAMETHASONE SODIUM PHOSPHATE 4 MG/ML IJ SOLN
4.0000 mg | Freq: Four times a day (QID) | INTRAMUSCULAR | Status: DC
  Administered 2023-12-15 (×2): 4 mg via INTRAVENOUS
  Filled 2023-12-15 (×2): qty 1

## 2023-12-15 MED ORDER — GADOBUTROL 1 MMOL/ML IV SOLN
10.0000 mL | Freq: Once | INTRAVENOUS | Status: AC | PRN
  Administered 2023-12-15: 10 mL via INTRAVENOUS

## 2023-12-15 MED ORDER — LORAZEPAM 2 MG/ML IJ SOLN
1.0000 mg | Freq: Once | INTRAMUSCULAR | Status: AC
  Administered 2023-12-15: 1 mg via INTRAVENOUS
  Filled 2023-12-15: qty 1

## 2023-12-15 MED ORDER — LORAZEPAM 2 MG/ML IJ SOLN: 1.0000 mg | Freq: Once | INTRAMUSCULAR | Status: DC

## 2023-12-15 MED ORDER — FAMOTIDINE 20 MG PO TABS
20.0000 mg | ORAL_TABLET | Freq: Two times a day (BID) | ORAL | Status: DC
  Administered 2023-12-15 – 2023-12-17 (×5): 20 mg via ORAL
  Filled 2023-12-15 (×5): qty 1

## 2023-12-15 MED ORDER — IOHEXOL 350 MG/ML SOLN
75.0000 mL | Freq: Once | INTRAVENOUS | Status: AC | PRN
  Administered 2023-12-15: 75 mL via INTRAVENOUS

## 2023-12-15 MED ORDER — FAMOTIDINE 40 MG/5ML PO SUSR
20.0000 mg | Freq: Two times a day (BID) | ORAL | Status: DC
  Filled 2023-12-15: qty 2.5

## 2023-12-15 MED ORDER — CEFAZOLIN SODIUM-DEXTROSE 2-4 GM/100ML-% IV SOLN: 2.0000 g | INTRAVENOUS | Status: DC

## 2023-12-15 MED ORDER — POTASSIUM CHLORIDE CRYS ER 20 MEQ PO TBCR
40.0000 meq | EXTENDED_RELEASE_TABLET | Freq: Once | ORAL | Status: AC
  Administered 2023-12-15: 40 meq via ORAL
  Filled 2023-12-15: qty 2

## 2023-12-15 MED ORDER — ORAL CARE MOUTH RINSE: 15.0000 mL | OROMUCOSAL | Status: DC | PRN

## 2023-12-15 MED ORDER — POLYETHYLENE GLYCOL 3350 17 G PO PACK: 17.0000 g | PACK | Freq: Every day | ORAL | Status: DC | PRN

## 2023-12-15 MED ORDER — INSULIN ASPART 100 UNIT/ML IJ SOLN
0.0000 [IU] | Freq: Three times a day (TID) | INTRAMUSCULAR | Status: DC
Start: 2023-12-15 — End: 2023-12-26
  Administered 2023-12-15 (×2): 1 [IU] via SUBCUTANEOUS
  Administered 2023-12-16: 2 [IU] via SUBCUTANEOUS
  Administered 2023-12-16: 3 [IU] via SUBCUTANEOUS
  Administered 2023-12-17: 1 [IU] via SUBCUTANEOUS
  Administered 2023-12-17 – 2023-12-19 (×3): 2 [IU] via SUBCUTANEOUS
  Administered 2023-12-19: 1 [IU] via SUBCUTANEOUS
  Administered 2023-12-19: 2 [IU] via SUBCUTANEOUS
  Administered 2023-12-20: 3 [IU] via SUBCUTANEOUS
  Administered 2023-12-20 – 2023-12-22 (×3): 1 [IU] via SUBCUTANEOUS
  Administered 2023-12-22 – 2023-12-23 (×3): 2 [IU] via SUBCUTANEOUS
  Administered 2023-12-23: 1 [IU] via SUBCUTANEOUS
  Administered 2023-12-24: 2 [IU] via SUBCUTANEOUS
  Administered 2023-12-24: 3 [IU] via SUBCUTANEOUS
  Administered 2023-12-24: 2 [IU] via SUBCUTANEOUS
  Administered 2023-12-25 – 2023-12-26 (×3): 1 [IU] via SUBCUTANEOUS

## 2023-12-15 MED ORDER — DEXAMETHASONE SODIUM PHOSPHATE 10 MG/ML IJ SOLN
10.0000 mg | Freq: Once | INTRAMUSCULAR | Status: AC
  Administered 2023-12-15: 10 mg via INTRAVENOUS
  Filled 2023-12-15: qty 1

## 2023-12-15 MED ORDER — HYDRALAZINE HCL 20 MG/ML IJ SOLN
10.0000 mg | INTRAMUSCULAR | Status: DC | PRN
Start: 2023-12-15 — End: 2023-12-26
  Administered 2023-12-19 – 2023-12-23 (×2): 10 mg via INTRAVENOUS
  Filled 2023-12-15 (×3): qty 1

## 2023-12-15 NOTE — ED Provider Notes (Addendum)
[place] EMERGENCY DEPARTMENT AT [facility] Provider Note   CSN: [id number] Arrival date & time: 12/15/23  0449     History  Chief Complaint  Patient presents with   Numbness    Nicholas Stevens is a 52 y.o. male.  52 year old previously healthy male that presents the ER today secondary to strokelike symptoms.  Patient was in his normal state of health when he woke up yesterday.  He was in his normal state of health when he left his house at 9:00 in the morning.  When he got home around noon after driving back and forth to somewhere he stated that he felt abnormal secondary to slow decision making and just not feeling right.  He had no numbness weakness tingling or any other vision changes at that time.  Throughout the day it sounds like things progressively worsened but did not really notice any abnormal motor movements until going to bed last night where patient would try to get into bed could not figure out how to get into the bed and then took 20-30 minutes to figure out how to get out of the bed.  He went to sleep when he woke up this morning he had significant left-sided weakness and sensation changes, abnormal face, slow speech and wife brought him here for evaluation.  No falls.  No history of the same.  Does have a right sided headache.  No other associated symptoms.        Home Medications Prior to Admission medications   Medication Sig Start Date End Date Taking? Authorizing Provider  Adapalene-Benzoyl Peroxide (EPIDUO) 0.1-2.5 % gel Apply 1 application  topically at bedtime. Apply to affected area 3 nights per week (Monday, Wednesday, Friday) 10/12/23   Terri Piedra, DO  anastrozole (ARIMIDEX) 1 MG tablet Take 1 mg by mouth once a week. 02/10/22   [provider]  Cholecalciferol (VITAMIN D3) 2000 units capsule Take 1 capsule (2,000 Units total) daily by mouth. 11/03/17   Plotnikov, Georgina Quint, MD  Cyanocobalamin (B-12 PO) Take 1 tablet by mouth  daily.    [provider]  doxycycline (VIBRA-TABS) 100 MG tablet Take 1 tablet (100 mg total) by mouth daily. TAKE WITH HEAVY FOOD 10/12/23 03/10/24  Terri Piedra, DO  Ginger, Zingiber officinalis, (GINGER PO) Take 1 capsule by mouth daily.    [provider]  hydrocortisone 2.5 % cream Apply topically 2 (two) times daily as needed (Rash). 07/13/23   Terri Piedra, DO  latanoprost (XALATAN) 0.005 % ophthalmic solution Place 1 drop into both eyes at bedtime.    [provider]  Multiple Vitamin (MULTIVITAMIN WITH MINERALS) TABS tablet Take 1 tablet by mouth daily.    [provider]  Omega-3 Fatty Acids (FISH OIL) 1000 MG CAPS Take 1,000 mg by mouth daily.    [provider]  pimecrolimus (ELIDEL) 1 % cream Apply topically 2 (two) times daily. 10/12/23   Terri Piedra, DO  Semaglutide-Weight Management (WEGOVY) 0.5 MG/0.5ML SOAJ Inject 0.5 mg into the skin once a week. 09/29/23   Plotnikov, Georgina Quint, MD  tadalafil (CIALIS) 20 MG tablet TAKE 1 TABLET BY MOUTH DAILY AS NEEDED FOR ERECTILE DYSFUNCTION Patient taking differently: Take 10 mg by mouth daily as needed for erectile dysfunction. 09/18/21   Plotnikov, Georgina Quint, MD  triamterene-hydrochlorothiazide (DYAZIDE) 37.5-25 MG capsule Take 1 each (1 capsule total) by mouth daily. 08/17/23   Plotnikov, Georgina Quint, MD  TURMERIC PO Take 1 capsule by mouth  daily.    [provider]  zolpidem (AMBIEN) 10 MG tablet TAKE 1 TABLET BY MOUTH EVERYDAY AT BEDTIME 08/08/23   Plotnikov, Georgina Quint, MD      Allergies    Patient has no known allergies.    Review of Systems   Review of Systems  Physical Exam Updated Vital Signs BP (!) 141/97 (BP Location: Left Arm)   Pulse 92   Temp 98.2 F (36.8 C) (Oral)   Resp 18   Ht 6\' 2"  (1.88 m)   Wt 115.7 kg   SpO2 98%   BMI 32.74 kg/m  Physical Exam Vitals and nursing note reviewed.  Constitutional:      Appearance: He is well-developed.  HENT:      Head: Normocephalic and atraumatic.     Nose: No congestion or rhinorrhea.  Eyes:     Extraocular Movements: Extraocular movements intact.     Pupils: Pupils are equal, round, and reactive to light.  Cardiovascular:     Rate and Rhythm: Normal rate.  Pulmonary:     Effort: Pulmonary effort is normal. No respiratory distress.  Abdominal:     General: There is no distension.  Musculoskeletal:        General: Normal range of motion.     Cervical back: Normal range of motion.  Neurological:     Mental Status: He is alert.     Comments: Left facial droop sparing the eye and forehead, left grip strength, bicep and tricep approximately 3 out of 5 compared to right 5 out of 5, does have pronator drift on the left, 4 out of 5 left plantar flexion/dorsiflexion. Slow speech but intelligible and appropriate, no dysarthria.      ED Results / Procedures / Treatments   Labs (all labs ordered are listed, but only abnormal results are displayed) Labs Reviewed  COMPREHENSIVE METABOLIC PANEL - Abnormal; Notable for the following components:      Result Value   Glucose, Bld 128 (*)    Total Protein 8.3 (*)    All other components within normal limits  CBC WITH DIFFERENTIAL/PLATELET - Abnormal; Notable for the following components:   RBC 6.40 (*)    Hemoglobin 17.3 (*)    HCT 53.0 (*)    RDW 17.7 (*)    All other components within normal limits  ETHANOL  PROTIME-INR  APTT  URINALYSIS, W/ REFLEX TO CULTURE (INFECTION SUSPECTED)  RAPID URINE DRUG SCREEN, HOSP PERFORMED    EKG None  Radiology DG Chest Portable 1 View Result Date: 12/15/2023 CLINICAL DATA:  Evaluate for weakness. EXAM: PORTABLE CHEST 1 VIEW COMPARISON:  03/12/2023 FINDINGS: There is asymmetric elevation of the right hemidiaphragm. Normal cardiomediastinal contours. No pleural fluid, interstitial edema or airspace consolidation. Visualized osseous structures are unremarkable. IMPRESSION: 1. No acute cardiopulmonary  disease. 2. Asymmetric elevation of the right hemidiaphragm. Electronically Signed   By: Signa Kell M.D.   On: 12/15/2023 06:13   CT ANGIO HEAD NECK W WO CM Result Date: 12/15/2023 CLINICAL DATA:  Neuro deficit with stroke suspected. EXAM: CT ANGIOGRAPHY HEAD AND NECK WITH AND WITHOUT CONTRAST TECHNIQUE: Multidetector CT imaging of the head and neck was performed using the standard protocol during bolus administration of intravenous contrast. Multiplanar CT image reconstructions and MIPs were obtained to evaluate the vascular anatomy. Carotid stenosis measurements (when applicable) are obtained utilizing NASCET criteria, using the distal internal carotid diameter as the denominator. RADIATION DOSE REDUCTION: This exam was performed according to the departmental dose-optimization program which  includes automated exposure control, adjustment of the mA and/or kV according to patient size and/or use of iterative reconstruction technique. CONTRAST:  75mL OMNIPAQUE IOHEXOL 350 MG/ML SOLN COMPARISON:  Head CT from earlier today. FINDINGS: CTA NECK FINDINGS Aortic arch: Unremarkable Right carotid system: Accounting for motion artifact vessels are smoothly contoured and widely patent with mild atheromatous plaque at the bifurcation. Left carotid system: Accounting for motion artifact vessels are smoothly contoured and diffusely patent with minimal atheromatous plaque at the bifurcation. Vertebral arteries: No proximal subclavian or vertebral stenosis. No evidence of beading or dissection. Skeleton: Posterior longitudinal ligament ossification to a mild degree at mid and lower cervical levels, which could contact the ventral cord. Other neck: No acute or aggressive finding Upper chest: Clear apical lungs. Review of the MIP images confirms the above findings CTA HEAD FINDINGS Anterior circulation: Hemorrhagic lesion at the superior right frontal lobe shows no spot sign or underlying aneurysm/vascular malformation. No  major branch occlusion, beading, or flow reducing stenosis. Posterior circulation: The vertebral and basilar arteries are smoothly contoured and diffusely patent. No branch occlusion, beading, or aneurysm. Venous sinuses: Diffusely patent, including at the superior sagittal sinus. Anatomic variants: None significant Review of the MIP images confirms the above findings IMPRESSION: 1. No vascular lesion or spot sign seen at the hemorrhagic right superior frontal lesion. 2. Mild atherosclerosis. 3. Intermittent motion artifact especially affecting the neck. Electronically Signed   By: Tiburcio Pea M.D.   On: 12/15/2023 06:08   CT HEAD WO CONTRAST Result Date: 12/15/2023 CLINICAL DATA:  Neuro deficit with acute stroke suspected EXAM: CT HEAD WITHOUT CONTRAST TECHNIQUE: Contiguous axial images were obtained from the base of the skull through the vertex without intravenous contrast. RADIATION DOSE REDUCTION: This exam was performed according to the departmental dose-optimization program which includes automated exposure control, adjustment of the mA and/or kV according to patient size and/or use of iterative reconstruction technique. COMPARISON:  08/04/2015 FINDINGS: Brain: Low-density area in the superior right frontal lobe with iso and hyperdense center. The hemorrhagic area measures up to 2.7 x 1.7 cm. Prominent degree of adjacent low-density with isodense and partially cystic center which may favors underlying mass. No hyperdensity in the adjacent superior sagittal sinus. There is local mass effect and leftward midline shift without herniation. No hydrocephalus or extra-axial collection. Vascular: No hyperdense vessel or unexpected calcification. Skull: Normal. Negative for fracture or focal lesion. Sinuses/Orbits: No acute finding Case discussed in epic chat, hemorrhage is already known to provider. IMPRESSION: Partially hemorrhagic lesion in the superior right frontal lobe with local mass effect. Suspect  underlying mass, recommend brain MRI with contrast. Electronically Signed   By: Tiburcio Pea M.D.   On: 12/15/2023 06:00    Procedures .Critical Care  Performed by: Marily Memos, MD Authorized by: Marily Memos, MD   Critical care provider statement:    Critical care time (minutes):  55   Critical care was necessary to treat or prevent imminent or life-threatening deterioration of the following conditions:  CNS failure or compromise   Critical care was time spent personally by me on the following activities:  Development of treatment plan with patient or surrogate, discussions with consultants, evaluation of patient's response to treatment, examination of patient, ordering and review of laboratory studies, ordering and review of radiographic studies, ordering and performing treatments and interventions, pulse oximetry, re-evaluation of patient's condition and review of old charts    Medications Ordered in ED Medications  iohexol (OMNIPAQUE) 350 MG/ML injection 75 mL (75  mLs Intravenous Contrast Given 12/15/23 0536)  dexamethasone (DECADRON) injection 10 mg (10 mg Intravenous Given 12/15/23 1610)    ED Course/ Medical Decision Making/ A&P                                 Medical Decision Making Amount and/or Complexity of Data Reviewed Labs: ordered. Radiology: ordered.  Risk Prescription drug management.   Patient not a candidate for code stroke on initial evaluation secondary to no LVO status and outside the window.  CT head without done and on my interpretation he appears to have hemorrhage with some edema also concern for possible mass.  Consulted neurology who recommended CTA.  Discussed with radiology as well who thought the same.  Neurology recommended MRI, ED to ED transfer, neurosurgery consult.  Decadron given.  MRI ordered.  Family and patient updated with concerns.  Discussed with Provider Esperanza Richters who works with Dr. Jake Samples with Neurosurgery and they will follow and  see in the ED on arrival to [facility]. [facility] providers and charge aware of patients arrival, status and carelink notified for transfer. Patient full code. [facility] here for transfer. Patient stable at this point, no significant change. No e/o impending herniation. No e/o impending airway failure.  Followed up with Dr. Ezzie Dural with neurology and states there's not much for them to do at this point unless difficult to control seizures. Mostly in NSG hands/medicine at this time.   Final Clinical Impression(s) / ED Diagnoses Final diagnoses:  Intracranial bleed (HCC)  Cerebral edema [facility])    Rx / DC Orders ED Discharge Orders     None         Gerrald Basu, Barbara Cower, MD 12/15/23 9604    Marily Memos, MD 12/15/23 [phone]

## 2023-12-15 NOTE — ED Notes (Signed)
PT returned from MRI.

## 2023-12-15 NOTE — ED Notes (Signed)
Pt states he needs to urinate.  Assisted by wife and RN without success.  Linens changed.

## 2023-12-15 NOTE — Progress Notes (Signed)
eLink Physician-Brief Progress Note Patient Name: CHRISTIN JONES DOB: June 13, 1971 MRN: [id number]   Date of Service  12/15/2023  HPI/Events of Note  Request for home ambien 10 mg nightly with CPAP  Admitted for R frontal lobe mass with vasogenic edema. NSY team plans for neuro checks and tentative plan for resection via crani on 12/23  Patient awake and alert  eICU Interventions  Will decrease ambien to 5 mg to be given once Monitor mental status Neuro checks     Intervention Category Minor Interventions: Other:  Mora Pedraza Mechele Collin 12/15/2023, 8:13 PM

## 2023-12-15 NOTE — H&P (Signed)
NAME:  Nicholas Stevens, MRN:  [id number], DOB:  1971/05/18, LOS: 0 ADMISSION DATE:  12/15/2023, CONSULTATION DATE:  12/20 REFERRING MD:  ED, CHIEF COMPLAINT:  necrotic hemorrhagic brain mass   History of Present Illness:  52 year old male with known OSA admitted with right frontal mass with hemorrhage.    Wife at bedside and assisting with history.  Patient noticed a decrease in response time while driving yesterday and new onset episodes of decrease use of left side.  This worsened this morning, with loss of coordination, including where he lost his balance and fell against the wall, denies hitting head or body, prompting to seek further medical treatment. CODE stroke imaging was obtained due to symptoms and revealed a partially  hemorrhagic lesion in the superior right frontal lobe with local mass effect.  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   Pertinent  Medical History  OSA Gastric sleeve surgery Hypertension ( no home meds) Hypogonadism   Significant Hospital Events: Including procedures, antibiotic start and stop dates in addition to other pertinent events   12/20 admit  Interim History / Subjective:  New consult  Objective   Blood pressure (!) 131/93, pulse 95, temperature 98 F (36.7 C), temperature source Oral, resp. rate (!) 24, height 6\' 2"  (1.88 m), weight 115.7 kg, SpO2 98%.       No intake or output data in the 24 hours ending 12/15/23 1008 Filed Weights   12/15/23 0503  Weight: 115.7 kg    Examination: General: Adult male resting in bed, comfortable HENT: [place]/AT, EOMI Lungs: Symmetric lung expansion with no increase work of breathing Cardiovascular: SR on telemetry, distal pulses intact without edema Abdomen: soft, non tender or distended Extremities: No deformities Neuro: Eyes open to voice, follows  commands promptly in the RUE and RLE.  LUE with drift, 2/5 strength, + grip. LLE with no movement.   Resolved Hospital Problem list     Assessment & Plan:  Right frontal lobe hemorrhagic mass Brain compression  -- NSGY consulted, awaiting formal recommendations -- No home a/c or antiplt. -- Decadron, add SSI while initiating steroids -- Goal SBP < 150 -- Brain MRI completed -- Wife concerned that his symptoms have worsened since last imaging.  Will repeat imaging.  Consider EEG if ongoing concerns  OSA -- Continued CPAP at night  Hypokalemia -- Replaced  Best Practice (right click and "Reselect all SmartList Selections" daily)   Diet/type: NPO DVT prophylaxis not indicated Pressure ulcer(s): N/A GI prophylaxis: H2B Lines: N/A Foley:  N/A Code Status:  full code Last date of multidisciplinary goals of care discussion [12/20]  Labs   CBC: Recent Labs  Lab 12/15/23 0507  WBC 5.1  NEUTROABS 3.6  HGB 17.3*  HCT 53.0*  MCV 82.8  PLT 247    Basic Metabolic Panel: Recent Labs  Lab 12/15/23 0507  NA 136  K 3.5  CL 98  CO2 30  GLUCOSE 128*  BUN 9  CREATININE 1.05  CALCIUM 9.9   GFR: Estimated Creatinine Clearance: 108.8 mL/min (by C-G formula based on SCr of 1.05 mg/dL). Recent Labs  Lab 12/15/23 0507  WBC 5.1    Liver Function Tests: Recent Labs  Lab 12/15/23 0507  AST 17  ALT 22  ALKPHOS 49  BILITOT 1.0  PROT 8.3*  ALBUMIN 4.5   No results for input(s): "  LIPASE", "AMYLASE" in the last 168 hours. No results for input(s): "AMMONIA" in the last 168 hours.  ABG No results found for: "PHART", "PCO2ART", "PO2ART", "HCO3", "TCO2", "ACIDBASEDEF", "O2SAT"   Coagulation Profile: Recent Labs  Lab 12/15/23 0507  INR 1.0    Cardiac Enzymes: No results for input(s): "CKTOTAL", "CKMB", "CKMBINDEX", "TROPONINI" in the last 168 hours.  HbA1C: No results found for: "HGBA1C"  CBG: No results for input(s): "GLUCAP" in the last 168 hours.  Review  of Systems:   As documented in HPI  Past Medical History:  He,  has a past medical history of DDD (degenerative disc disease), lumbar, HNP (herniated nucleus pulposus), Hypertension, Liver hemangioma, Morbid obesity (HCC), OSA (obstructive sleep apnea), Overweight(278.02), Plantar fasciitis of right foot, Sleep apnea, and Tinea barbae.   Surgical History:   Past Surgical History:  Procedure Laterality Date   BREATH TEK H PYLORI N/A 08/20/2013   Procedure: BREATH TEK H PYLORI;  Surgeon: Valarie Merino, MD;  Location: [facility];  Service: General;  Laterality: N/A;   COLONOSCOPY WITH PROPOFOL N/A 05/05/2022   Procedure: COLONOSCOPY WITH PROPOFOL;  Surgeon: Beverley Fiedler, MD;  Location: [facility];  Service: Gastroenterology;  Laterality: N/A;   ESOPHAGOGASTRODUODENOSCOPY (EGD) WITH PROPOFOL N/A 03/15/2023   Procedure: ESOPHAGOGASTRODUODENOSCOPY (EGD) WITH PROPOFOL;  Surgeon: Iva Boop, MD;  Location: [facility];  Service: Gastroenterology;  Laterality: N/A;   LAPAROSCOPIC APPENDECTOMY  2007   Dr Carolynne Edouard   LAPAROSCOPIC GASTRIC SLEEVE RESECTION N/A 10/08/2013   Procedure: LAPAROSCOPIC SLEEVE GASTRECTOMY;  Surgeon: Valarie Merino, MD;  Location: [facility];  Service: General;  Laterality: N/A;   LUMBAR LAMINECTOMY/DECOMPRESSION MICRODISCECTOMY N/A 09/16/2015   Procedure: MICRO LUMBAR DECOMPRESSION, MICRODISCECTOMY L5 - S1;  Surgeon: Jene Every, MD;  Location: [facility];  Service: Orthopedics;  Laterality: N/A;   POLYPECTOMY  05/05/2022   Procedure: POLYPECTOMY;  Surgeon: Beverley Fiedler, MD;  Location: [facility];  Service: Gastroenterology;;   ROTATOR CUFF REPAIR Right 2005   Dr Lajoyce Corners     Social History:   reports that he has never smoked. He has never been exposed to tobacco smoke. He has never used smokeless tobacco. He reports that he does not drink alcohol and does not use drugs.   Family History:  His family history includes Hypertension in his father; Liver disease in his  father; Other in his mother. There is no history of Colon cancer, Colon polyps, Esophageal cancer, Rectal cancer, or Stomach cancer.   Allergies No Known Allergies   Home Medications  Prior to Admission medications   Medication Sig Start Date End Date Taking? Authorizing Provider  Cholecalciferol (VITAMIN D3) 2000 units capsule Take 1 capsule (2,000 Units total) daily by mouth. 11/03/17  Yes Plotnikov, Georgina Quint, MD  Cyanocobalamin (B-12 PO) Take 1 tablet by mouth daily.   Yes [provider]  doxycycline (VIBRA-TABS) 100 MG tablet Take 1 tablet (100 mg total) by mouth daily. TAKE WITH HEAVY FOOD 10/12/23 03/10/24 Yes Terri Piedra, DO  Ginger, Zingiber officinalis, (GINGER PO) Take 1 capsule by mouth daily.   Yes [provider]  Ibuprofen (ADVIL LIQUI-GELS MINIS) 200 MG CAPS Take 800 mg by mouth every 6 (six) hours as needed (Pain).   Yes [provider]  latanoprost (XALATAN) 0.005 % ophthalmic solution Place 1 drop into both eyes at bedtime.   Yes [provider]  Multiple Vitamin (MULTIVITAMIN WITH MINERALS) TABS tablet Take 1 tablet by mouth daily.   Yes [provider]  Omega-3 Fatty Acids (FISH OIL) 1000 MG CAPS Take 1,000 mg by mouth daily.   Yes [provider]  pimecrolimus (ELIDEL) 1 % cream Apply topically 2 (two) times daily. Patient taking differently: Apply 1 Application topically 2 (two) times daily as needed (Dermatitis). 10/12/23  Yes Terri Piedra, DO  Semaglutide-Weight Management (WEGOVY) 0.5 MG/0.5ML SOAJ Inject 0.5 mg into the skin once a week. 09/29/23  Yes Plotnikov, Georgina Quint, MD  tadalafil (CIALIS) 20 MG tablet TAKE 1 TABLET BY MOUTH DAILY AS NEEDED FOR ERECTILE DYSFUNCTION Patient taking differently: Take 10 mg by mouth daily as needed for erectile dysfunction. 09/18/21  Yes Plotnikov, Georgina Quint, MD  triamterene-hydrochlorothiazide (DYAZIDE) 37.5-25 MG capsule Take 1 each (1 capsule total) by mouth daily.  08/17/23  Yes Plotnikov, Georgina Quint, MD  TURMERIC PO Take 1 capsule by mouth daily.   Yes [provider]  zolpidem (AMBIEN) 10 MG tablet TAKE 1 TABLET BY MOUTH EVERYDAY AT BEDTIME 08/08/23  Yes Plotnikov, Georgina Quint, MD  Adapalene-Benzoyl Peroxide (EPIDUO) 0.1-2.5 % gel Apply 1 application  topically at bedtime. Apply to affected area 3 nights per week (Monday, Wednesday, Friday) Patient not taking: Reported on 12/15/2023 10/12/23   Terri Piedra, DO     Critical care time: 50 min

## 2023-12-15 NOTE — ED Provider Notes (Addendum)
Patient received in transfer from [place], [place] from [facility] for MR due to suspected brain bleed and brain mass. Neurosurgery is aware of patient and so get imaging and reach back out to neurosurgery.  I went to evaluate the patient patient stated that he has had no changes in symptoms since being transferred from [facility] and has not had any new features or symptoms.  Patient appeared stable and so we will get imaging.  Consults: Talmage Nap, PA-C PCCM  MRI shows necrotic and hemorrhagic mass in the superior right frontal lobe with features consistent with glioblastoma. Neurosurgery recommends decadron 4 mg q6hrs which will be ordered. There is also mass effect with early right uncal herniation.  Neurosurgery was made aware of these findings and will evaluate patient. Will consult ICU for admission.  Patient's accepted for admission.  After discussion with the critical care and neurosurgeon placement will likely be either TRH or CCM.  Patient stable for admission at this time.  .Critical Care  Performed by: Netta Corrigan, PA-C Authorized by: Netta Corrigan, PA-C   Critical care provider statement:    Critical care time (minutes):  30   Critical care time was exclusive of:  Separately billable procedures and treating other patients   Critical care was necessary to treat or prevent imminent or life-threatening deterioration of the following conditions:  CNS failure or compromise   Critical care was time spent personally by me on the following activities:  Blood draw for specimens, development of treatment plan with patient or surrogate, discussions with consultants, evaluation of patient's response to treatment, obtaining history from patient or surrogate, examination of patient, review of old charts, re-evaluation of patient's condition, pulse oximetry, ordering and review of radiographic studies, ordering and review of laboratory studies and ordering and performing treatments and  interventions   I assumed direction of critical care for this patient from another provider in my specialty: yes     Care discussed with: admitting provider       Netta Corrigan, PA-C 12/15/23 1004    Netta Corrigan, PA-C 12/15/23 1016    Franne Forts, DO 12/22/23 0139

## 2023-12-15 NOTE — ED Notes (Addendum)
Pt arrived from [facility] a and o x 4. NIH of 5 at present. Pt is at [facility] for emergent MRI for hemorrhagic stroke

## 2023-12-15 NOTE — ED Notes (Signed)
Admitting provider at bedside.

## 2023-12-15 NOTE — ED Notes (Addendum)
-  Called [facility] at 611am for transportation to [facility] ED, Dr. Oletta Cohn Accepting.

## 2023-12-15 NOTE — ED Notes (Signed)
Patient transported to MRI 

## 2023-12-15 NOTE — ED Triage Notes (Signed)
Pt began feeling "slow" yesterday at 1000. Pt began feeling weak on left side last night at around 2300 12/14/23 . Pt weak on the left side. Pt describes as heaviness. Pt had 5/10 HA last night before going to bed, this morning pt HA was 8/10. While getting up this morning pt had fall. Did not hit head. No LOC.

## 2023-12-15 NOTE — Consult Note (Addendum)
   Providing Compassionate, Quality Care - Together  Neurosurgery Consult  Referring physician: EDP Reason for referral: LUE/LLE weakness  HPI: Pt reports onset of symptoms yesterday evening while driving home from work, described as decreased reaction time. While at home he and his wife (who provides add'l history) noticed persistent weakness and inability to use his LUE/LLE. They went to [facility] ED for evaluation. No h/o recent trauma. Workup in ED revealing R frontal lobe lesion with hemorrhage and mass effect. Pt transferred to ED [facility]. MRI revealing characteristics suggestive of glioblastoma. 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     Physical Exam:  Vital signs in last 24 hours: Temp:  [98 F (36.7 C)-98.3 F (36.8 C)] 98 F (36.7 C) (07/25 1814) Pulse Rate:  [58-128] 65 (07/26 0746) Resp:  [11-18] 14 (07/26 0217) BP: (138-182)/(65-125) 153/88 (07/26 0700) SpO2:  [91 %-98 %] 96 % (07/26 0746)  PE: Awake, alert, oriented Speech fluent, appropriate Normal affect CN grossly intact PERRLA EOMI FC x4 MAEs x4 RUE/RLE strength 5/5 LUE: bicep and grip 3/5, deltoid, tricep, wrist flex/ex 2/5 LLE: DF/PF 3/5, KF/KE and HF/HE 2/5 Fine motor control intact using RUE, RLE.    CTH/MRI brain reviewed. Imaging concerning for R superior frontal lobe glioblastoma with surrounding vasogenic edema.   Impression/Assessment:  This is a 51 yo male with  -R superior frontal lobe mass with surrounding vasogenic edema, GBM suspected  Plan:  -Continue Decadron 4mg  Q6H -Recommend additional metastatic workup per primary team including CT chest/abd/pelvis -Neuro checks  -SBP<150 -Tentatively plan for resection via craniotomy on 12/18/23. -MRI with stealth protocol needed if decision made to proceed.  -Call w/ questions/concerns.   SARA Margaree Mackintosh, PA-C  Attending Addendum:  Patient seen and examined.  He is drowsy, but easily arouses to attention.  Currently no volitional movement  in LUE or LLE. Minimal facial droop. Speech fluent.    I had a long discussion with the patient and family.  I discussed the diagnosis of malignant brain tumor.  It is a necrotic tumor in the right superior frontal gyrus in premotor area, possibly some extension into the SMA.  There is considerable perilesional edema and significant mass effect on the motor cortex.  Given the size of the tumor and amount of perilesional swelling, the patient would do best with resective surgery, whether or not this is a high grade malignant glioma or a secondary malignancy.  I discussed the general technique of surgery, as well as risk, benefits, alternatives, and expected convalescence.  Risks discussed included, but not limited to, bleeding, pain, infection, scar, seizure, stroke, neurologic deficit, recurrence, coma, and death.  Informed consent was obtained and they wished to proceed with surgery.  Will plan for surgery Monday which will give an opportunity for several days of steroid treatment to help with swelling.  Given the small hemorrhagic changes in the necrotic mass, I do not recommend starting pharmacologic DVT prophylaxis.  I also am recommending metastatic workup with CT chest abdomen pelvis, and he will need an MRI brain Stealth protocol as well.  All questions and concerns were answered.

## 2023-12-16 ENCOUNTER — Inpatient Hospital Stay (HOSPITAL_COMMUNITY): Payer: Commercial Managed Care - PPO

## 2023-12-16 DIAGNOSIS — G9389 Other specified disorders of brain: Secondary | ICD-10-CM | POA: Diagnosis not present

## 2023-12-16 LAB — GLUCOSE, CAPILLARY
Glucose-Capillary: 119 mg/dL — ABNORMAL HIGH (ref 70–99)
Glucose-Capillary: 232 mg/dL — ABNORMAL HIGH (ref 70–99)
Glucose-Capillary: 163 mg/dL — ABNORMAL HIGH (ref 70–99)
Glucose-Capillary: 141 mg/dL — ABNORMAL HIGH (ref 70–99)

## 2023-12-16 MED ORDER — HYDROCODONE-ACETAMINOPHEN 5-325 MG PO TABS
1.0000 | ORAL_TABLET | ORAL | Status: DC | PRN
  Administered 2023-12-16 – 2023-12-21 (×5): 1 via ORAL
  Filled 2023-12-16 (×6): qty 1

## 2023-12-16 MED ORDER — IOHEXOL 350 MG/ML SOLN
75.0000 mL | Freq: Once | INTRAVENOUS | Status: AC | PRN
  Administered 2023-12-16: 75 mL via INTRAVENOUS

## 2023-12-16 MED ORDER — GADOBUTROL 1 MMOL/ML IV SOLN
10.0000 mL | Freq: Once | INTRAVENOUS | Status: AC | PRN
  Administered 2023-12-16: 10 mL via INTRAVENOUS

## 2023-12-16 MED ORDER — ZOLPIDEM TARTRATE 5 MG PO TABS
5.0000 mg | ORAL_TABLET | Freq: Every evening | ORAL | Status: DC | PRN
  Administered 2023-12-17 – 2023-12-25 (×9): 5 mg via ORAL
  Filled 2023-12-16 (×9): qty 1

## 2023-12-16 MED ORDER — INSULIN ASPART 100 UNIT/ML IJ SOLN
5.0000 [IU] | Freq: Three times a day (TID) | INTRAMUSCULAR | Status: DC
Start: 2023-12-16 — End: 2023-12-26
  Administered 2023-12-16 – 2023-12-26 (×24): 5 [IU] via SUBCUTANEOUS

## 2023-12-16 MED ORDER — LORAZEPAM 2 MG/ML IJ SOLN
1.0000 mg | Freq: Once | INTRAMUSCULAR | Status: AC
  Administered 2023-12-16: 1 mg via INTRAVENOUS
  Filled 2023-12-16: qty 1

## 2023-12-16 MED ORDER — BACLOFEN 10 MG PO TABS
5.0000 mg | ORAL_TABLET | Freq: Three times a day (TID) | ORAL | Status: DC | PRN
  Administered 2023-12-16 – 2023-12-17 (×2): 5 mg via ORAL
  Filled 2023-12-16 (×2): qty 1

## 2023-12-16 NOTE — Plan of Care (Signed)
  Problem: Education: Goal: Knowledge of General Education information will improve Description: Including pain rating scale, medication(s)/side effects and non-pharmacologic comfort measures Outcome: Progressing   Problem: Clinical Measurements: Goal: Ability to maintain clinical measurements within normal limits will improve Outcome: Progressing Goal: Cardiovascular complication will be avoided Outcome: Progressing   Problem: Nutrition: Goal: Adequate nutrition will be maintained Outcome: Progressing   Problem: Coping: Goal: Level of anxiety will decrease Outcome: Progressing   Problem: Elimination: Goal: Will not experience complications related to urinary retention Outcome: Progressing   Problem: Pain Management: Goal: General experience of comfort will improve Outcome: Progressing

## 2023-12-16 NOTE — Progress Notes (Signed)
  NEUROSURGERY PROGRESS NOTE   Pt seen and examined. No issues overnight. Pt with HA exacerbated by valsalva. Unchanged left weakness.  EXAM: Temp:  [98 F (36.7 C)-98.5 F (36.9 C)] 98.3 F (36.8 C) (12/21 1200) Pulse Rate:  [78-103] 101 (12/21 1200) Resp:  [12-23] 18 (12/21 1200) BP: (115-161)/(77-105) 128/86 (12/21 1200) SpO2:  [89 %-98 %] 90 % (12/21 1200) FiO2 (%):  [21 %] 21 % (12/20 2116) Weight:  [119.9 kg] 119.9 kg (12/21 0500) Intake/Output      12/20 0701 12/21 0700 12/21 0701 12/22 0700   P.O. 240    Total Intake(mL/kg) 240 (2)    Urine (mL/kg/hr) 725 (0.3)    Total Output 725    Net -485          Awake, alert, oriented Speech fluent CN grossly intact 2-3/5 LUE/LLE 5/5 RUE/RLE  LABS: Lab Results  Component Value Date   CREATININE 1.05 12/15/2023   BUN 9 12/15/2023   NA 136 12/15/2023   K 3.5 12/15/2023   CL 98 12/15/2023   CO2 30 12/15/2023   Lab Results  Component Value Date   WBC 5.1 12/15/2023   HGB 17.3 (H) 12/15/2023   HCT 53.0 (H) 12/15/2023   MCV 82.8 12/15/2023   PLT 247 12/15/2023    IMAGING: MRI reveals peripherally enhancing parasagittal right frontal lesion with significant associated mass effect on the ventricle and MLS. No HCP  IMPRESSION: - 52 y.o. male with symptomatic right frontal mass suspicious for HGG, will require resection for relief of mass effect and diagnosis.  PLAN: - Cont supportive care - Will need stereo MRI - Cont dexamethasone - Plan on surgery for resection with Dr. Maisie Fus on Mon 12/23   Lisbeth Renshaw, MD [facility]

## 2023-12-16 NOTE — Progress Notes (Signed)
eLink Physician-Brief Progress Note Patient Name: Nicholas Stevens DOB: 04-07-1971 MRN: [id number]   Date of Service  12/16/2023  HPI/Events of Note  Patient complaining of persistant hiccups started 12/20 but now getting worst. Asking for med to help stop them.  eICU Interventions  Baclofen prn ordered     Intervention Category Minor Interventions: Routine modifications to care plan (e.g. PRN medications for pain, fever)  Rosalie Gums Corrie Reder 12/16/2023, 8:45 PM

## 2023-12-16 NOTE — Progress Notes (Signed)
eLink Physician-Brief Progress Note Patient Name: Nicholas Stevens DOB: [birth year] MRN: [id number]   Date of Service  12/16/2023  HPI/Events of Note  Acute urinary retention Bladder scan 460 cc  eICU Interventions  Foley ordered     Intervention Category Intermediate Interventions: Oliguria - evaluation and management  Leonardo Plaia Mechele Collin 12/16/2023, 5:09 AM

## 2023-12-16 NOTE — Progress Notes (Signed)
   NAME:  Nicholas Stevens, MRN:  [id number], DOB:  22-Sep-1971, LOS: 1 ADMISSION DATE:  12/15/2023, CONSULTATION DATE:  12/20 REFERRING MD:  ED, CHIEF COMPLAINT:  necrotic hemorrhagic brain mass   History of Present Illness:  52 year old male with known OSA admitted with right frontal mass with hemorrhage.    Wife at bedside and assisting with history.  Patient noticed a decrease in response time while driving yesterday and new onset episodes of decrease use of left side.  This worsened this morning, with loss of coordination, including where he lost his balance and fell against the wall, denies hitting head or body, prompting to seek further medical treatment. CODE stroke imaging was obtained due to symptoms and revealed a partially  hemorrhagic lesion in the superior right frontal lobe with local mass effect.  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   Pertinent  Medical History  OSA Gastric sleeve surgery Hypertension ( no home meds) Hypogonadism   Significant Hospital Events: Including procedures, antibiotic start and stop dates in addition to other pertinent events   12/20 admit  Interim History / Subjective:  Weakness unchanged. Didn't use CPAP due to ill-fitting hospital mask.   Objective   Blood pressure (!) 135/90, pulse 92, temperature 98.3 F (36.8 C), temperature source Axillary, resp. rate 16, height 6\' 2"  (1.88 m), weight 119.9 kg, SpO2 92%.    FiO2 (%):  [21 %] 21 %   Intake/Output Summary (Last 24 hours) at 12/16/2023 1525 Last data filed at 12/16/2023 0615 Gross per 24 hour  Intake 240 ml  Output 725 ml  Net -485 ml   Filed Weights   12/15/23 0503 12/16/23 0500  Weight: 115.7 kg 119.9 kg    Examination: General: Adult male resting in bed, comfortable HENT: oral mucosa moist. Lungs: Chest  clear. Cardiovascular: HS normal Abdomen: soft, non tender Extremities: No deformities Neuro: Eyes open to voice, follows commands promptly in the RUE and RLE.  4/5 strength in LUE/LLE  Resolved Hospital Problem list   Hyperglycemic on steroids.   Assessment & Plan:  Right frontal lobe hemorrhagic mass Brain compression OSA Steroid induced in hyperglycemia  Plan:  - for stereotactic MRI - encourage patient's wife to bring in home CPAP. - added mealtime insulin coverage to sliding scale insulin - allow ambulation in room and PT consultation.  Best Practice (right click and "Reselect all SmartList Selections" daily)   Diet/type: carb modified diet DVT prophylaxis not indicated Pressure ulcer(s): N/A GI prophylaxis: H2B Lines: N/A Foley:  N/A Code Status:  full code Last date of multidisciplinary goals of care discussion [12/20]   Lynnell Catalan, MD [facility] ICU Physician [facility]  Pager: [phone] Mobile: [phone] After hours: [phone].

## 2023-12-17 DIAGNOSIS — G9389 Other specified disorders of brain: Secondary | ICD-10-CM | POA: Diagnosis not present

## 2023-12-17 LAB — SURGICAL PCR SCREEN
MRSA, PCR: NEGATIVE
Staphylococcus aureus: NEGATIVE

## 2023-12-17 LAB — GLUCOSE, CAPILLARY
Glucose-Capillary: 195 mg/dL — ABNORMAL HIGH (ref 70–99)
Glucose-Capillary: 145 mg/dL — ABNORMAL HIGH (ref 70–99)
Glucose-Capillary: 195 mg/dL — ABNORMAL HIGH (ref 70–99)
Glucose-Capillary: 142 mg/dL — ABNORMAL HIGH (ref 70–99)

## 2023-12-17 MED ORDER — PANTOPRAZOLE SODIUM 40 MG PO TBEC
40.0000 mg | DELAYED_RELEASE_TABLET | Freq: Every day | ORAL | Status: DC
  Administered 2023-12-17 – 2023-12-26 (×9): 40 mg via ORAL
  Filled 2023-12-17 (×9): qty 1

## 2023-12-17 MED ORDER — SODIUM CHLORIDE 0.9 % IV SOLN
3.0000 g | INTRAVENOUS | Status: AC
  Administered 2023-12-18: 3 g via INTRAVENOUS
  Filled 2023-12-17 (×2): qty 3

## 2023-12-17 MED ORDER — MUPIROCIN 2 % EX OINT
1.0000 | TOPICAL_OINTMENT | Freq: Two times a day (BID) | CUTANEOUS | Status: AC
  Administered 2023-12-17 – 2023-12-22 (×10): 1 via NASAL
  Filled 2023-12-17 (×2): qty 22

## 2023-12-17 MED ORDER — BACLOFEN 10 MG PO TABS
10.0000 mg | ORAL_TABLET | Freq: Three times a day (TID) | ORAL | Status: AC | PRN
Start: 2023-12-17 — End: 2023-12-17
  Administered 2023-12-17: 10 mg via ORAL
  Filled 2023-12-17: qty 1

## 2023-12-17 NOTE — Evaluation (Signed)
Physical Therapy Evaluation Patient Details Name: Nicholas Stevens MRN: [id number] DOB: Jun 06, 1971 Today's Date: 12/17/2023  History of Present Illness  The pt is a 52 yo male presenting 12/20 after noticing a decrease in response time while driving and then continued loss of coordination and balance. Imaging showed a necrotic and hemorrhagic mass in the superior right frontal lobe with features of glioblastoma with mass effect. Planned crani 12/23 with neurosurgery for tumor resection. PMH includes: obesity and OSA.   Clinical Impression  Pt in bed upon arrival of PT, agreeable to evaluation at this time. 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 The pt required maxA for bed mobility and mod-maxA of 2 for sit-stand and pivot to recliner with use of stedy at this time due to weakness in Left extremities and impaired static sitting and standing balance. The pt required repeated multimodal cues and increased processing time for sequencing all tasks and balance/postural corrections. Will continue to follow acutely to progress balance, coordination, proprioception, safety awareness, and mobility  during admission, will re-evaluate after planned surgery, but anticipate pt needing intensive therapies once medically stable for d/c.     If plan is discharge home, recommend the following: Two people to help with walking and/or transfers;Two people to help with bathing/dressing/bathroom;Assistance with cooking/housework;Direct supervision/assist for medications management;Direct supervision/assist for financial management;Assist for transportation;Help with stairs or ramp for entrance;Supervision due to cognitive status   Can travel by private vehicle        Equipment Recommendations Other (comment) (defer to post acute)  Recommendations for Other Services  Rehab consult    Functional Status Assessment Patient  has had a recent decline in their functional status and demonstrates the ability to make significant improvements in function in a reasonable and predictable amount of time.     Precautions / Restrictions Precautions Precautions: Fall Restrictions Weight Bearing Restrictions Per Provider Order: No      Mobility  Bed Mobility Overal bed mobility: Needs Assistance Bed Mobility: Sidelying to Sit, Rolling Rolling: Max assist Sidelying to sit: Max assist       General bed mobility comments: maxA with increased time and cues with increased processing time. pt needing tactile cues at times for using RUE to push to sitting    Transfers Overall transfer level: Needs assistance Equipment used: Ambulation equipment used Transfers: Sit to/from Stand, Bed to chair/wheelchair/BSC Sit to Stand: Mod assist, +2 physical assistance, From elevated surface           General transfer comment: modA of 2 to use stedy, assist for trunk balance as pt leaning to L. used stedy to pivot to recliner Transfer via Lift Equipment: Stedy  Ambulation/Gait               General Gait Details: NT due to difficulty with static stance  Stairs            Wheelchair Mobility     Tilt Bed    Modified Rankin (Stroke Patients Only)       Balance Overall balance assessment: Needs assistance Sitting-balance support: Single extremity supported, Feet supported Sitting balance-Leahy Scale: Poor Sitting balance - Comments: falling to L without mod-maxA at times. able to correct with RUE support and max cues but not able to sustain Postural control: Posterior lean, Left lateral lean Standing balance support: Single extremity supported, During functional activity Standing balance-Leahy Scale: Zero Standing balance comment: dependent on UE support  and mod-maxA of 2 and L knee blocking                             Pertinent Vitals/Pain Pain Assessment Pain Assessment: 0-10 Pain  Score: 3  Pain Location: headache Pain Descriptors / Indicators: Headache Pain Intervention(s): Limited activity within patient's tolerance, Monitored during session, Repositioned    Home Living Family/patient expects to be discharged to:: Private residence Living Arrangements: Spouse/significant other Available Help at Discharge: Family;Available 24 hours/day Type of Home: House Home Access: Level entry       Home Layout: One level Home Equipment: Shower seat;Hand held shower head      Prior Function Prior Level of Function : Independent/Modified Independent;Working/employed;Driving             Mobility Comments: independent, active ADLs Comments: independent, working in [employer] examiner     Extremity/Trunk Assessment   Upper Extremity Assessment Upper Extremity Assessment: Defer to OT evaluation    Lower Extremity Assessment Lower Extremity Assessment: LLE deficits/detail LLE Deficits / Details: no active movement in LLE, reports sensation intact, 2/5 correct for proprioception testing. LLE Coordination: decreased fine motor;decreased gross motor    Cervical / Trunk Assessment Cervical / Trunk Assessment: Other exceptions Cervical / Trunk Exceptions: large body habitus  Communication   Communication Communication: Difficulty following commands/understanding Following commands: Follows one step commands inconsistently;Follows one step commands with increased time Cueing Techniques: Verbal cues;Gestural cues;Tactile cues  Cognition Arousal: Lethargic Behavior During Therapy: Flat affect Overall Cognitive Status: Impaired/Different from baseline Area of Impairment: Attention, Memory, Following commands, Safety/judgement, Awareness, Problem solving                   Current Attention Level: Focused Memory: Decreased short-term memory Following Commands: Follows one step commands inconsistently, Follows one step commands with increased  time Safety/Judgement: Decreased awareness of safety, Decreased awareness of deficits Awareness: Intellectual Problem Solving: Slow processing, Decreased initiation, Difficulty sequencing, Requires verbal cues, Requires tactile cues General Comments: pt needing max repeated cues to complete task, often needing cues for sequencing and 5-7 seconds processing time. R gaze preference but can turn to L and visually track to left. reports no change in vision        General Comments General comments (skin integrity, edema, etc.): BP elevated, RN present and okay with transfer to recliner, BP back to limits after transfer, pt reports no sx    Exercises     Assessment/Plan    PT Assessment Patient needs continued PT services  PT Problem List Decreased strength;Decreased range of motion;Decreased activity tolerance;Decreased balance;Decreased mobility;Decreased coordination;Decreased cognition;Decreased knowledge of use of DME;Decreased safety awareness;Decreased knowledge of precautions;Obesity       PT Treatment Interventions Gait training;DME instruction;Functional mobility training;Therapeutic activities;Therapeutic exercise;Balance training;Cognitive remediation;Neuromuscular re-education;Patient/family education    PT Goals (Current goals can be found in the Care Plan section)  Acute Rehab PT Goals Patient Stated Goal: return to independence PT Goal Formulation: With patient/family Time For Goal Achievement: 12/31/23 Potential to Achieve Goals: Fair    Frequency Min 1X/week     Co-evaluation               AM-PAC PT "6 Clicks" Mobility  Outcome Measure Help needed turning from your back to your side while in a flat bed without using bedrails?: Total Help needed moving from lying on your back to sitting on the side of a flat bed without using bedrails?: Total Help needed moving to  and from a bed to a chair (including a wheelchair)?: Total Help needed standing up from a chair  using your arms (e.g., wheelchair or bedside chair)?: Total Help needed to walk in hospital room?: Total Help needed climbing 3-5 steps with a railing? : Total 6 Click Score: 6    End of Session Equipment Utilized During Treatment: Gait belt Activity Tolerance: Patient tolerated treatment well Patient left: in chair;with call bell/phone within reach;with family/visitor present;with nursing/sitter in room Nurse Communication: Mobility status;Need for lift equipment PT Visit Diagnosis: Unsteadiness on feet (R26.81);Other abnormalities of gait and mobility (R26.89);Muscle weakness (generalized) (M62.81);Pain;Hemiplegia and hemiparesis Hemiplegia - Right/Left: Left Hemiplegia - dominant/non-dominant: Non-dominant Hemiplegia - caused by:  (brain tumor) Pain - part of body:  (headache)    Time: 0932-3557 PT Time Calculation (min) (ACUTE ONLY): 34 min   Charges:   PT Evaluation $PT Eval Moderate Complexity: 1 Mod PT Treatments $Therapeutic Activity: 8-22 mins PT General Charges $$ ACUTE PT VISIT: 1 Visit         Vickki Muff, PT, DPT   Acute Rehabilitation Department Office [phone] Secure Chat Communication Preferred  Ronnie Derby 12/17/2023, 1:51 PM

## 2023-12-17 NOTE — Progress Notes (Signed)
   Providing Compassionate, Quality Care - Together  NEUROSURGERY PROGRESS NOTE   S: No issues overnight.   O: EXAM:  BP (!) 146/93   Pulse 86   Temp (!) 97.4 F (36.3 C) (Oral)   Resp 15   Ht 6\' 2"  (1.88 m)   Wt 120.5 kg   SpO2 92%   BMI 34.11 kg/m   Awake, alert, oriented  PERRL Speech fluent, appropriate  CNs grossly intact, except for left facial droop Right upper and lower extremity full strength Left upper and lower extremity 2/5  ASSESSMENT:  52 y.o. male with   Right frontal intra-axial tumor, concern for high-grade glioma  PLAN: -OR tomorrow, n.p.o. at midnight -I discussed surgical plan with the family and the patient as well as expected postoperative course.  I answered all their questions.  He is scheduled for surgery tomorrow at 1 PM with Dr. Maisie Fus    Thank you for allowing me to participate in this patient's care.  Please do not hesitate to call with questions or concerns.   Monia Pouch, DO Neurosurgeon [facility] [phone]

## 2023-12-17 NOTE — Progress Notes (Signed)
   NAME:  Nicholas Stevens, MRN:  [id number], DOB:  18-Aug-1971, LOS: 2 ADMISSION DATE:  12/15/2023, CONSULTATION DATE:  12/20 REFERRING MD:  ED, CHIEF COMPLAINT:  necrotic hemorrhagic brain mass   History of Present Illness:  52 year old male with known OSA admitted with right frontal mass with hemorrhage.    Wife at bedside and assisting with history.  Patient noticed a decrease in response time while driving yesterday and new onset episodes of decrease use of left side.  This worsened this morning, with loss of coordination, including where he lost his balance and fell against the wall, denies hitting head or body, prompting to seek further medical treatment. CODE stroke imaging was obtained due to symptoms and revealed a partially  hemorrhagic lesion in the superior right frontal lobe with local mass effect.  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   Pertinent  Medical History  OSA Gastric sleeve surgery Hypertension ( no home meds) Hypogonadism  Significant Hospital Events: Including procedures, antibiotic start and stop dates in addition to other pertinent events   12/20 admit  Interim History / Subjective:  Deficits unchanged. On baclofen for hiccups. Hungry. Did better with home CPAP.  Objective   Blood pressure (!) 146/93, pulse 86, temperature 97.6 F (36.4 C), temperature source Axillary, resp. rate 15, height 6\' 2"  (1.88 m), weight 120.5 kg, SpO2 92%.    FiO2 (%):  [21 %] 21 %   Intake/Output Summary (Last 24 hours) at 12/17/2023 1215 Last data filed at 12/17/2023 0600 Gross per 24 hour  Intake 440 ml  Output 1050 ml  Net -610 ml   Filed Weights   12/15/23 0503 12/16/23 0500 12/17/23 0500  Weight: 115.7 kg 119.9 kg 120.5 kg    Examination: General: Adult male lying in bed. Looks scared HENT: oral mucosa  moist. Lungs: Chest clear. Cardiovascular: HS normal Abdomen: soft, non tender Extremities: No deformities Neuro: Awake and interactive. Left sided weakness, 4/5. Responses are very delayed on the left side. No facial asymmetry.   Resolved Hospital Problem list   Steroids hyperglycemia improved with increased insulin. CT chest/AP - no evidence of malignant disease MRI done for stereotactic mapping shows unchanged tumor.  Assessment & Plan:  Right frontal lobe hemorrhagic mass Brain compression OSA Steroid induced in hyperglycemia  Plan:  - For tumor resection tomorrow.  Continue current dose of steroids. - Continue current insulin regimen - at risk for steroid hyperglycemia. Encouraged patient to continue with carb-modified diet to limit steroid induced hyperglycemia even though Decadron has increased patient's appetite.  -Continue CPAP use.  At risk for postoperative hypoventilation. -Encouragement provided.  Best Practice (right click and "Reselect all SmartList Selections" daily)   Diet/type: carb modified diet DVT prophylaxis SCD - start chemical prophylaxis tomorrow post op  Pressure ulcer(s): N/A GI prophylaxis: H2B Lines: N/A Foley:  N/A Code Status:  full code Last date of multidisciplinary goals of care discussion [12/20]   Lynnell Catalan, MD [facility] ICU Physician [facility]  Pager: [phone] Mobile: [phone] After hours: [phone].

## 2023-12-17 NOTE — Anesthesia Preprocedure Evaluation (Addendum)
Anesthesia Evaluation    Reviewed: Allergy & Precautions, H&P , Patient's Chart, lab work & pertinent test results  History of Anesthesia Complications (+) DIFFICULT AIRWAY and history of anesthetic complications  Airway Mallampati: II  TM Distance: >3 FB Neck ROM: Full    Dental no notable dental hx.    Pulmonary sleep apnea    Pulmonary exam normal breath sounds clear to auscultation       Cardiovascular hypertension, Pt. on medications Normal cardiovascular exam Rhythm:Regular Rate:Normal  ECHO 3/24 1. Left ventricular ejection fraction, by estimation, is 55 to 60%. The  left ventricle has normal function. The left ventricle has no regional  wall motion abnormalities. Left ventricular diastolic parameters were  normal.   2. Right ventricular systolic function is normal. The right ventricular  size is normal. Tricuspid regurgitation signal is inadequate for assessing  PA pressure.   3. The mitral valve is grossly normal. Trivial mitral valve  regurgitation. No evidence of mitral stenosis.   4. The aortic valve is tricuspid. Aortic valve regurgitation is not  visualized. No aortic stenosis is present.   5. The inferior vena cava is normal in size with greater than 50%  respiratory variability, suggesting right atrial pressure of 3 mmHg.     Neuro/Psych negative neurological ROS  negative psych ROS   GI/Hepatic negative GI ROS, Neg liver ROS,,,  Endo/Other    Class 3 obesity  Renal/GU negative Renal ROS  negative genitourinary   Musculoskeletal  (+) Arthritis , Osteoarthritis,    Abdominal   Peds negative pediatric ROS (+)  Hematology negative hematology ROS (+)   Anesthesia Other Findings   Reproductive/Obstetrics negative OB ROS                             Anesthesia Physical Anesthesia Plan  ASA: 3  Anesthesia Plan: General   Post-op Pain Management: Minimal or no pain  anticipated   Induction: Intravenous  PONV Risk Score and Plan: 2 and Ondansetron, Dexamethasone and Treatment may vary due to age or medical condition  Airway Management Planned: Oral ETT and Video Laryngoscope Planned  Additional Equipment: Arterial line  Intra-op Plan:   Post-operative Plan: Extubation in OR  Informed Consent: I have reviewed the patients History and Physical, chart, labs and discussed the procedure including the risks, benefits and alternatives for the proposed anesthesia with the patient or authorized representative who has indicated his/her understanding and acceptance.     Dental advisory given  Plan Discussed with: CRNA and Anesthesiologist  Anesthesia Plan Comments: (  )       Anesthesia Quick Evaluation

## 2023-12-17 NOTE — Progress Notes (Signed)
Inpatient Rehab Admissions Coordinator: .  Per therapy recommendations,  patient was screened for CIR candidacy by Megan Salon, MS, CCC-SLP.  Plan is for crani 12/23. At this time, Pt. is not medically ready for CIR. I will not pursue a rehab consult for this Pt. at this time, but CIR admissions team will follow and monitor for medical readiness and place consult order if Pt. appears to be an appropriate candidate. Please contact me with any questions.   Megan Salon, MS, CCC-SLP Rehab Admissions Coordinator  [phone] (celll) [phone] (office)

## 2023-12-18 ENCOUNTER — Inpatient Hospital Stay (HOSPITAL_COMMUNITY): Payer: Commercial Managed Care - PPO | Admitting: Anesthesiology

## 2023-12-18 ENCOUNTER — Other Ambulatory Visit: Payer: Self-pay

## 2023-12-18 ENCOUNTER — Encounter (HOSPITAL_COMMUNITY)
Admission: EM | Disposition: A | Discharge status: Rehabilitation Facility | Payer: Self-pay | Source: Home / Self Care | Attending: Pulmonary Disease

## 2023-12-18 ENCOUNTER — Encounter (HOSPITAL_COMMUNITY): Payer: Self-pay | Admitting: Pulmonary Disease

## 2023-12-18 DIAGNOSIS — C711 Malignant neoplasm of frontal lobe: Secondary | ICD-10-CM

## 2023-12-18 DIAGNOSIS — G4733 Obstructive sleep apnea (adult) (pediatric): Secondary | ICD-10-CM

## 2023-12-18 DIAGNOSIS — I10 Essential (primary) hypertension: Secondary | ICD-10-CM

## 2023-12-18 DIAGNOSIS — G9389 Other specified disorders of brain: Secondary | ICD-10-CM | POA: Diagnosis not present

## 2023-12-18 HISTORY — PX: CRANIOTOMY: SHX93

## 2023-12-18 LAB — GLUCOSE, CAPILLARY
Glucose-Capillary: 133 mg/dL — ABNORMAL HIGH (ref 70–99)
Glucose-Capillary: 120 mg/dL — ABNORMAL HIGH (ref 70–99)
Glucose-Capillary: 128 mg/dL — ABNORMAL HIGH (ref 70–99)
Glucose-Capillary: 42 mg/dL — CL (ref 70–99)
Glucose-Capillary: 183 mg/dL — ABNORMAL HIGH (ref 70–99)

## 2023-12-18 LAB — CBC
HCT: 55.3 % — ABNORMAL HIGH (ref 39.0–52.0)
Hemoglobin: 17.9 g/dL — ABNORMAL HIGH (ref 13.0–17.0)
MCH: 27 pg (ref 26.0–34.0)
MCHC: 32.4 g/dL (ref 30.0–36.0)
MCV: 83.4 fL (ref 80.0–100.0)
Platelets: 304 10*3/uL (ref 150–400)
RBC: 6.63 MIL/uL — ABNORMAL HIGH (ref 4.22–5.81)
RDW: 17.5 % — ABNORMAL HIGH (ref 11.5–15.5)
WBC: 8.7 10*3/uL (ref 4.0–10.5)
nRBC: 0 % (ref 0.0–0.2)

## 2023-12-18 LAB — TYPE AND SCREEN
ABO/RH(D): B POS
Antibody Screen: NEGATIVE

## 2023-12-18 LAB — BASIC METABOLIC PANEL
Anion gap: 9 (ref 5–15)
BUN: 16 mg/dL (ref 6–20)
CO2: 22 mmol/L (ref 22–32)
Calcium: 8.8 mg/dL — ABNORMAL LOW (ref 8.9–10.3)
Chloride: 105 mmol/L (ref 98–111)
Creatinine, Ser: 0.93 mg/dL (ref 0.61–1.24)
GFR, Estimated: >60 mL/min (ref >60)
Glucose, Bld: 123 mg/dL — ABNORMAL HIGH (ref 70–99)
Potassium: 4.2 mmol/L (ref 3.5–5.1)
Sodium: 136 mmol/L (ref 135–145)

## 2023-12-18 LAB — ABO/RH: ABO/RH(D): B POS

## 2023-12-18 SURGERY — CRANIOTOMY TUMOR EXCISION
Anesthesia: General | Site: Head | Laterality: Right

## 2023-12-18 MED ORDER — PROMETHAZINE HCL 12.5 MG PO TABS
12.5000 mg | ORAL_TABLET | ORAL | Status: DC | PRN
Start: 2023-12-18 — End: 2023-12-26
  Administered 2023-12-20 (×3): 25 mg via ORAL
  Filled 2023-12-18 (×4): qty 2

## 2023-12-18 MED ORDER — DOCUSATE SODIUM 100 MG PO CAPS
100.0000 mg | ORAL_CAPSULE | Freq: Two times a day (BID) | ORAL | Status: DC
  Administered 2023-12-18 – 2023-12-19 (×2): 100 mg via ORAL
  Filled 2023-12-18 (×2): qty 1

## 2023-12-18 MED ORDER — PROPOFOL 500 MG/50ML IV EMUL
INTRAVENOUS | Status: DC | PRN
  Administered 2023-12-18: 80 ug/kg/min via INTRAVENOUS

## 2023-12-18 MED ORDER — ACETAMINOPHEN 160 MG/5ML PO SOLN
325.0000 mg | ORAL | Status: DC | PRN
Start: 2023-12-18 — End: 2023-12-18

## 2023-12-18 MED ORDER — ONDANSETRON HCL 4 MG/2ML IJ SOLN: 4.0000 mg | Freq: Once | INTRAMUSCULAR | Status: DC | PRN

## 2023-12-18 MED ORDER — DEXAMETHASONE 4 MG PO TABS
4.0000 mg | ORAL_TABLET | Freq: Three times a day (TID) | ORAL | Status: AC
  Administered 2023-12-20 – 2023-12-22 (×6): 4 mg via ORAL
  Filled 2023-12-18 (×7): qty 1

## 2023-12-18 MED ORDER — GLYCOPYRROLATE PF 0.2 MG/ML IJ SOSY
PREFILLED_SYRINGE | INTRAMUSCULAR | Status: DC | PRN
  Administered 2023-12-18: .2 mg via INTRAVENOUS

## 2023-12-18 MED ORDER — 0.9 % SODIUM CHLORIDE (POUR BTL) OPTIME
TOPICAL | Status: DC | PRN
  Administered 2023-12-18 (×5): 1000 mL

## 2023-12-18 MED ORDER — MEPERIDINE HCL 25 MG/ML IJ SOLN
6.2500 mg | INTRAMUSCULAR | Status: DC | PRN
Start: 2023-12-18 — End: 2023-12-18

## 2023-12-18 MED ORDER — PROPOFOL 10 MG/ML IV BOLUS
INTRAVENOUS | Status: DC | PRN
  Administered 2023-12-18: 80 mg via INTRAVENOUS
  Administered 2023-12-18: 50 mg via INTRAVENOUS
  Administered 2023-12-18: 40 mg via INTRAVENOUS

## 2023-12-18 MED ORDER — THROMBIN 20000 UNITS EX SOLR
CUTANEOUS | Status: AC
  Filled 2023-12-18: qty 20000

## 2023-12-18 MED ORDER — MANNITOL 20 % IV SOLN: INTRAVENOUS | Status: DC | PRN

## 2023-12-18 MED ORDER — SODIUM CHLORIDE 0.9 % IR SOLN
Status: DC | PRN
  Administered 2023-12-18 (×2): 1000 mL

## 2023-12-18 MED ORDER — ONDANSETRON HCL 4 MG/2ML IJ SOLN
INTRAMUSCULAR | Status: AC
  Filled 2023-12-18: qty 2

## 2023-12-18 MED ORDER — PROPOFOL 10 MG/ML IV BOLUS
INTRAVENOUS | Status: AC
  Filled 2023-12-18: qty 20

## 2023-12-18 MED ORDER — DEXAMETHASONE SODIUM PHOSPHATE 10 MG/ML IJ SOLN
INTRAMUSCULAR | Status: DC | PRN
  Administered 2023-12-18: 10 mg via INTRAVENOUS

## 2023-12-18 MED ORDER — GLYCOPYRROLATE PF 0.2 MG/ML IJ SOSY
PREFILLED_SYRINGE | INTRAMUSCULAR | Status: AC
  Filled 2023-12-18: qty 1

## 2023-12-18 MED ORDER — LEVETIRACETAM IN NACL 500 MG/100ML IV SOLN
500.0000 mg | Freq: Two times a day (BID) | INTRAVENOUS | Status: DC
  Administered 2023-12-18 – 2023-12-19 (×2): 500 mg via INTRAVENOUS
  Filled 2023-12-18 (×2): qty 100

## 2023-12-18 MED ORDER — FENTANYL CITRATE (PF) 100 MCG/2ML IJ SOLN: 25.0000 ug | INTRAMUSCULAR | Status: DC | PRN

## 2023-12-18 MED ORDER — ACETAMINOPHEN 325 MG PO TABS
650.0000 mg | ORAL_TABLET | ORAL | Status: DC | PRN
  Administered 2023-12-19: 650 mg via ORAL
  Filled 2023-12-18: qty 2

## 2023-12-18 MED ORDER — DEXAMETHASONE 4 MG PO TABS
2.0000 mg | ORAL_TABLET | Freq: Every day | ORAL | Status: DC
Start: 2023-12-27 — End: 2023-12-26

## 2023-12-18 MED ORDER — DEXAMETHASONE 4 MG PO TABS
4.0000 mg | ORAL_TABLET | Freq: Two times a day (BID) | ORAL | Status: AC
  Administered 2023-12-22 – 2023-12-24 (×4): 4 mg via ORAL
  Filled 2023-12-18 (×4): qty 1

## 2023-12-18 MED ORDER — SODIUM CHLORIDE 0.9 % IV SOLN: INTRAVENOUS | Status: DC

## 2023-12-18 MED ORDER — DEXAMETHASONE 4 MG PO TABS
2.0000 mg | ORAL_TABLET | Freq: Two times a day (BID) | ORAL | Status: AC
  Administered 2023-12-24 – 2023-12-26 (×4): 2 mg via ORAL
  Filled 2023-12-18 (×4): qty 1

## 2023-12-18 MED ORDER — PHENYLEPHRINE HCL-NACL 20-0.9 MG/250ML-% IV SOLN
INTRAVENOUS | Status: DC | PRN
  Administered 2023-12-18: 40 ug/min via INTRAVENOUS

## 2023-12-18 MED ORDER — MIDAZOLAM HCL 2 MG/2ML IJ SOLN
INTRAMUSCULAR | Status: AC
Start: 2023-12-18 — End: ?
  Filled 2023-12-18: qty 2

## 2023-12-18 MED ORDER — ORAL CARE MOUTH RINSE
15.0000 mL | Freq: Once | OROMUCOSAL | Status: AC
Start: 2023-12-18 — End: 2023-12-18

## 2023-12-18 MED ORDER — FENTANYL CITRATE (PF) 250 MCG/5ML IJ SOLN
INTRAMUSCULAR | Status: DC | PRN
  Administered 2023-12-18: 50 ug via INTRAVENOUS
  Administered 2023-12-18 (×2): 100 ug via INTRAVENOUS

## 2023-12-18 MED ORDER — SODIUM CHLORIDE 0.9 % IV SOLN
0.1500 ug/kg/min | INTRAVENOUS | Status: DC
  Filled 2023-12-18: qty 2000

## 2023-12-18 MED ORDER — CHLORHEXIDINE GLUCONATE 0.12 % MT SOLN
OROMUCOSAL | Status: AC
  Administered 2023-12-18: 15 mL via OROMUCOSAL
  Filled 2023-12-18: qty 15

## 2023-12-18 MED ORDER — FUROSEMIDE 10 MG/ML IJ SOLN
INTRAMUSCULAR | Status: DC | PRN
  Administered 2023-12-18: 10 mg via INTRAMUSCULAR

## 2023-12-18 MED ORDER — THROMBIN 5000 UNITS EX SOLR
CUTANEOUS | Status: AC
  Filled 2023-12-18: qty 5000

## 2023-12-18 MED ORDER — OXYCODONE HCL 5 MG/5ML PO SOLN: 5.0000 mg | Freq: Once | ORAL | Status: DC | PRN

## 2023-12-18 MED ORDER — LABETALOL HCL 5 MG/ML IV SOLN
INTRAVENOUS | Status: AC
  Filled 2023-12-18: qty 4

## 2023-12-18 MED ORDER — SODIUM CHLORIDE 0.9 % IV SOLN
0.1500 ug/kg/min | INTRAVENOUS | Status: DC
  Administered 2023-12-18: .1 ug/kg/min via INTRAVENOUS
  Filled 2023-12-18 (×2): qty 2000

## 2023-12-18 MED ORDER — FENTANYL CITRATE (PF) 100 MCG/2ML IJ SOLN
INTRAMUSCULAR | Status: AC
  Administered 2023-12-18: 25 ug via INTRAVENOUS
  Filled 2023-12-18: qty 2

## 2023-12-18 MED ORDER — LIDOCAINE 2% (20 MG/ML) 5 ML SYRINGE
INTRAMUSCULAR | Status: DC | PRN
  Administered 2023-12-18: 20 mg via INTRAVENOUS

## 2023-12-18 MED ORDER — DEXAMETHASONE SODIUM PHOSPHATE 10 MG/ML IJ SOLN
INTRAMUSCULAR | Status: AC
  Filled 2023-12-18: qty 1

## 2023-12-18 MED ORDER — ACETAMINOPHEN 650 MG RE SUPP: 650.0000 mg | RECTAL | Status: DC | PRN

## 2023-12-18 MED ORDER — FENTANYL CITRATE (PF) 250 MCG/5ML IJ SOLN
INTRAMUSCULAR | Status: AC
  Filled 2023-12-18: qty 5

## 2023-12-18 MED ORDER — LIDOCAINE-EPINEPHRINE 1 %-1:100000 IJ SOLN
INTRAMUSCULAR | Status: DC | PRN
  Administered 2023-12-18: 8 mL

## 2023-12-18 MED ORDER — ONDANSETRON HCL 4 MG/2ML IJ SOLN: 4.0000 mg | INTRAMUSCULAR | Status: DC | PRN

## 2023-12-18 MED ORDER — ACETAMINOPHEN 325 MG PO TABS: 325.0000 mg | ORAL_TABLET | ORAL | Status: DC | PRN

## 2023-12-18 MED ORDER — THROMBIN 5000 UNITS EX SOLR: OROMUCOSAL | Status: DC | PRN

## 2023-12-18 MED ORDER — ROCURONIUM BROMIDE 10 MG/ML (PF) SYRINGE
PREFILLED_SYRINGE | INTRAVENOUS | Status: AC
  Filled 2023-12-18: qty 10

## 2023-12-18 MED ORDER — THROMBIN 20000 UNITS EX SOLR: CUTANEOUS | Status: DC | PRN

## 2023-12-18 MED ORDER — LIDOCAINE-EPINEPHRINE 1 %-1:100000 IJ SOLN
INTRAMUSCULAR | Status: AC
  Filled 2023-12-18: qty 1

## 2023-12-18 MED ORDER — BACITRACIN ZINC 500 UNIT/GM EX OINT
TOPICAL_OINTMENT | CUTANEOUS | Status: AC
  Filled 2023-12-18: qty 28.35

## 2023-12-18 MED ORDER — CHLORHEXIDINE GLUCONATE 0.12 % MT SOLN: 15.0000 mL | Freq: Once | OROMUCOSAL | Status: AC

## 2023-12-18 MED ORDER — DEXAMETHASONE 4 MG PO TABS
4.0000 mg | ORAL_TABLET | Freq: Four times a day (QID) | ORAL | Status: AC
  Administered 2023-12-18 – 2023-12-20 (×8): 4 mg via ORAL
  Filled 2023-12-18 (×8): qty 1

## 2023-12-18 MED ORDER — CEFAZOLIN SODIUM-DEXTROSE 2-4 GM/100ML-% IV SOLN
2.0000 g | Freq: Three times a day (TID) | INTRAVENOUS | Status: AC
  Administered 2023-12-18 – 2023-12-19 (×2): 2 g via INTRAVENOUS
  Filled 2023-12-18 (×2): qty 100

## 2023-12-18 MED ORDER — ONDANSETRON HCL 4 MG PO TABS: 4.0000 mg | ORAL_TABLET | ORAL | Status: DC | PRN

## 2023-12-18 MED ORDER — HEMOSTATIC AGENTS (NO CHARGE) OPTIME
TOPICAL | Status: DC | PRN
  Administered 2023-12-18 (×3): 1 via TOPICAL

## 2023-12-18 MED ORDER — OXYCODONE HCL 5 MG PO TABS: 5.0000 mg | ORAL_TABLET | Freq: Once | ORAL | Status: DC | PRN

## 2023-12-18 MED ORDER — SUCCINYLCHOLINE CHLORIDE 200 MG/10ML IV SOSY
PREFILLED_SYRINGE | INTRAVENOUS | Status: DC | PRN
  Administered 2023-12-18: 100 mg via INTRAVENOUS

## 2023-12-18 MED ORDER — PROCHLORPERAZINE EDISYLATE 10 MG/2ML IJ SOLN
5.0000 mg | Freq: Once | INTRAMUSCULAR | Status: AC
  Administered 2023-12-18: 5 mg via INTRAVENOUS
  Filled 2023-12-18: qty 2

## 2023-12-18 MED ORDER — HYDROMORPHONE HCL 1 MG/ML IJ SOLN: 0.5000 mg | INTRAMUSCULAR | Status: DC | PRN

## 2023-12-18 MED ORDER — LIDOCAINE 2% (20 MG/ML) 5 ML SYRINGE
INTRAMUSCULAR | Status: AC
  Filled 2023-12-18: qty 5

## 2023-12-18 MED ORDER — BACITRACIN ZINC 500 UNIT/GM EX OINT
TOPICAL_OINTMENT | CUTANEOUS | Status: DC | PRN
  Administered 2023-12-18: 1 via TOPICAL

## 2023-12-18 MED ORDER — LABETALOL HCL 5 MG/ML IV SOLN
10.0000 mg | INTRAVENOUS | Status: DC | PRN
  Administered 2023-12-19: 20 mg via INTRAVENOUS
  Filled 2023-12-18: qty 4

## 2023-12-18 MED ORDER — PROPOFOL 1000 MG/100ML IV EMUL
INTRAVENOUS | Status: AC
  Filled 2023-12-18: qty 100

## 2023-12-18 MED ORDER — FLEET ENEMA RE ENEM: 1.0000 | ENEMA | Freq: Once | RECTAL | Status: DC | PRN

## 2023-12-18 MED ORDER — ONDANSETRON HCL 4 MG/2ML IJ SOLN
INTRAMUSCULAR | Status: DC | PRN
  Administered 2023-12-18: 4 mg via INTRAVENOUS

## 2023-12-18 SURGICAL SUPPLY — 105 items
BAG COUNTER SPONGE SURGICOUNT (BAG) ×2 IMPLANT
BENZOIN TINCTURE PRP APPL 2/3 (GAUZE/BANDAGES/DRESSINGS) IMPLANT
BLADE CLIPPER SURG (BLADE) ×2 IMPLANT
BLADE SAW GIGLI 16 STRL (MISCELLANEOUS) IMPLANT
BLADE SURG 11 STRL SS (BLADE) ×2 IMPLANT
BLADE SURG 15 STRL LF DISP TIS (BLADE) IMPLANT
BNDG GAUZE DERMACEA FLUFF 4 (GAUZE/BANDAGES/DRESSINGS) IMPLANT
BNDG STRETCH 4X75 STRL LF (GAUZE/BANDAGES/DRESSINGS) IMPLANT
BUR ACORN 9.0 PRECISION (BURR) ×2 IMPLANT
BUR ROUND FLUTED 4 SOFT TCH (BURR) IMPLANT
BUR SPIRAL ROUTER 2.3 (BUR) ×2 IMPLANT
CANISTER SUCT 3000ML PPV (MISCELLANEOUS) ×4 IMPLANT
CASSETTE SUCT IRRIG SONOPET IQ (MISCELLANEOUS) IMPLANT
CATH VENTRIC 35X38 W/TROCAR LG (CATHETERS) IMPLANT
CLIP TI MEDIUM 6 (CLIP) IMPLANT
CNTNR URN SCR LID CUP LEK RST (MISCELLANEOUS) ×2 IMPLANT
COVER BURR HOLE UNIV 10 (Orthopedic Implant) IMPLANT
COVER MAYO STAND STRL (DRAPES) IMPLANT
COVERAGE SUPPORT O-ARM STEALTH (MISCELLANEOUS) ×2 IMPLANT
DRAIN SUBARACHNOID (WOUND CARE) IMPLANT
DRAPE 3/4 80X56 (DRAPES) ×2 IMPLANT
DRAPE CAMERA VIDEO/LASER (DRAPES) ×2 IMPLANT
DRAPE HALF SHEET 40X57 (DRAPES) ×2 IMPLANT
DRAPE MICROSCOPE SLANT 54X150 (MISCELLANEOUS) IMPLANT
DRAPE NEUROLOGICAL W/INCISE (DRAPES) ×2 IMPLANT
DRAPE SLUSH/WARMER DISC (DRAPES) IMPLANT
DRAPE STERI IOBAN 125X83 (DRAPES) IMPLANT
DRAPE SURG 17X23 STRL (DRAPES) IMPLANT
DRAPE WARM FLUID 44X44 (DRAPES) ×2 IMPLANT
DRSG ADAPTIC 3X8 NADH LF (GAUZE/BANDAGES/DRESSINGS) IMPLANT
DRSG AQUACEL AG ADV 3.5X 6 (GAUZE/BANDAGES/DRESSINGS) IMPLANT
DRSG TELFA 3X8 NADH STRL (GAUZE/BANDAGES/DRESSINGS) IMPLANT
DURAPREP 6ML APPLICATOR 50/CS (WOUND CARE) ×2 IMPLANT
ELECT COATED BLADE 2.86 ST (ELECTRODE) ×2 IMPLANT
ELECT REM PT RETURN 9FT ADLT (ELECTROSURGICAL) ×2 IMPLANT
ELECTRODE REM PT RTRN 9FT ADLT (ELECTROSURGICAL) ×2 IMPLANT
EVACUATOR 1/8 PVC DRAIN (DRAIN) IMPLANT
EVACUATOR SILICONE 100CC (DRAIN) IMPLANT
FEE COVERAGE SUPPORT O-ARM (MISCELLANEOUS) ×2 IMPLANT
FEE INTRAOP CADWELL SUPPLY NCS (MISCELLANEOUS) IMPLANT
FEE INTRAOP MONITOR IMPULS NCS (MISCELLANEOUS) IMPLANT
FORCEPS BIPO MALIS IRRIG 9X1.5 (NEUROSURGERY SUPPLIES) ×2 IMPLANT
GAUZE 4X4 16PLY ~~LOC~~+RFID DBL (SPONGE) IMPLANT
GAUZE SPONGE 4X4 12PLY STRL (GAUZE/BANDAGES/DRESSINGS) IMPLANT
GAUZE XEROFORM 1X8 LF (GAUZE/BANDAGES/DRESSINGS) IMPLANT
GLOVE BIO SURGEON STRL SZ7 (GLOVE) ×8 IMPLANT
GLOVE BIOGEL PI IND STRL 7.5 (GLOVE) ×6 IMPLANT
GLOVE ECLIPSE 7.5 STRL STRAW (GLOVE) ×2 IMPLANT
GLOVE EXAM NITRILE LRG STRL (GLOVE) IMPLANT
GLOVE EXAM NITRILE XL STR (GLOVE) IMPLANT
GOWN STRL REUS W/ TWL LRG LVL3 (GOWN DISPOSABLE) ×4 IMPLANT
GOWN STRL REUS W/ TWL XL LVL3 (GOWN DISPOSABLE) ×4 IMPLANT
GOWN STRL REUS W/TWL 2XL LVL3 (GOWN DISPOSABLE) IMPLANT
GRAFT DURAGEN MATRIX 3X3 SNGL (Graft) IMPLANT
HEMOSTAT POWDER KIT SURGIFOAM (HEMOSTASIS) ×2 IMPLANT
HEMOSTAT SURGICEL 2X14 (HEMOSTASIS) ×2 IMPLANT
HEMOSTAT SURGICEL 2X4 FIBR (HEMOSTASIS) IMPLANT
HOOK DURA 1/2IN (MISCELLANEOUS) ×2 IMPLANT
HOOK RETRACTION 12 ELAST STAY (MISCELLANEOUS) IMPLANT
INTRAOP CADWELL SUPPLY FEE NCS (MISCELLANEOUS) ×2 IMPLANT
INTRAOP MONITOR FEE IMPULS NCS (MISCELLANEOUS) ×2 IMPLANT
IV NS 1000ML BAXH (IV SOLUTION) ×2 IMPLANT
KIT BASIN OR (CUSTOM PROCEDURE TRAY) ×2 IMPLANT
KIT DRAIN CSF ACCUDRAIN (MISCELLANEOUS) IMPLANT
KIT TURNOVER KIT B (KITS) ×2 IMPLANT
MARKER SPHERE PSV REFLC NDI (MISCELLANEOUS) ×6 IMPLANT
NDL HYPO 22X1.5 SAFETY MO (MISCELLANEOUS) ×2 IMPLANT
NDL SPNL 18GX3.5 QUINCKE PK (NEEDLE) IMPLANT
NEEDLE HYPO 22X1.5 SAFETY MO (MISCELLANEOUS) ×2 IMPLANT
NEEDLE SPNL 18GX3.5 QUINCKE PK (NEEDLE) IMPLANT
NS IRRIG 1000ML POUR BTL (IV SOLUTION) ×6 IMPLANT
PACK BATTERY CMF DISP FOR DVR (ORTHOPEDIC DISPOSABLE SUPPLIES) IMPLANT
PACK CRANIOTOMY CUSTOM (CUSTOM PROCEDURE TRAY) ×2 IMPLANT
PATTIES SURGICAL .25X.25 (GAUZE/BANDAGES/DRESSINGS) IMPLANT
PATTIES SURGICAL .5 X.5 (GAUZE/BANDAGES/DRESSINGS) IMPLANT
PATTIES SURGICAL .5 X3 (DISPOSABLE) IMPLANT
PATTIES SURGICAL 1/4 X 3 (GAUZE/BANDAGES/DRESSINGS) IMPLANT
PATTIES SURGICAL 1X1 (DISPOSABLE) IMPLANT
PIN MAYFIELD SKULL DISP (PIN) ×2 IMPLANT
PROBE BIPOLAR CORTI STIM NCS (MISCELLANEOUS) IMPLANT
SCREW UNIII AXS SD 1.5X4 (Screw) IMPLANT
SEALANT ADHERUS EXTEND TIP (MISCELLANEOUS) IMPLANT
SET TUBING IRRIGATION DISP (TUBING) ×2 IMPLANT
SPIKE FLUID TRANSFER (MISCELLANEOUS) ×2 IMPLANT
SPONGE NEURO XRAY DETECT 1X3 (DISPOSABLE) IMPLANT
SPONGE SURGIFOAM ABS GEL 100 (HEMOSTASIS) ×2 IMPLANT
STAPLER SKIN PROX WIDE 3.9 (STAPLE) IMPLANT
STAPLER VISISTAT 35W (STAPLE) ×2 IMPLANT
STOCKINETTE STERILE 6X72 (MISCELLANEOUS) IMPLANT
STRIP PLATINUM NCS 1X4 NCS (MISCELLANEOUS) IMPLANT
SUT ETHILON 3 0 FSL (SUTURE) IMPLANT
SUT ETHILON 3 0 PS 1 (SUTURE) IMPLANT
SUT MNCRL AB 3-0 PS2 18 (SUTURE) IMPLANT
SUT NURALON 4 0 TR CR/8 (SUTURE) ×6 IMPLANT
SUT SILK 0 TIES 10X30 (SUTURE) IMPLANT
SUT VIC AB 2-0 CP2 18 (SUTURE) ×2 IMPLANT
SUT VICRYL RAPIDE 3 0 (SUTURE) IMPLANT
TIP TISSUE SONOPET IQ STD 12 (TIP) IMPLANT
TOWEL GREEN STERILE (TOWEL DISPOSABLE) ×2 IMPLANT
TOWEL GREEN STERILE FF (TOWEL DISPOSABLE) ×2 IMPLANT
TRAY FOLEY MTR SLVR 16FR STAT (SET/KITS/TRAYS/PACK) ×2 IMPLANT
TUBE CONNECTING 12X1/4 (SUCTIONS) ×2 IMPLANT
TUBING FEATHERFLOW (TUBING) IMPLANT
UNDERPAD 30X36 HEAVY ABSORB (UNDERPADS AND DIAPERS) ×2 IMPLANT
WATER STERILE IRR 1000ML POUR (IV SOLUTION) ×2 IMPLANT

## 2023-12-18 NOTE — Anesthesia Procedure Notes (Signed)
Arterial Line Insertion Start/End12/23/2024 11:25 AM, 12/18/2023 11:31 AM Performed by: Achille Rich, MD, Courtney Paris, CRNA, CRNA  Patient location: Pre-op. Preanesthetic checklist: patient identified, IV checked, site marked, risks and benefits discussed, surgical consent, monitors and equipment checked, pre-op evaluation, timeout performed and anesthesia consent Lidocaine 1% used for infiltration Right, radial was placed Catheter size: 20 G Hand hygiene performed , maximum sterile barriers used  and Seldinger technique used Allen's test indicative of satisfactory collateral circulation Attempts: 1 Procedure performed using ultrasound guided technique. Ultrasound Notes:anatomy identified, needle tip was noted to be adjacent to the nerve/plexus identified and no ultrasound evidence of intravascular and/or intraneural injection Following insertion, dressing applied and Biopatch. Post procedure assessment: normal and unchanged  Patient tolerated the procedure well with no immediate complications.

## 2023-12-18 NOTE — Progress Notes (Addendum)
eLink Physician-Brief Progress Note Patient Name: Nicholas Stevens DOB: 1971/07/16 MRN: [id number]   Date of Service  12/18/2023  HPI/Events of Note  Asked to place diet order as per CCM notes. Had already passed swallow evaluation.   eICU Interventions  Order placed     Intervention Category Intermediate Interventions: Other:  Oretha Milch 12/18/2023, 7:34 PM  Addendum at 11 pm - Asked if ok to DC art line since it is not working. Order placed Also with chronic hiccups. Not a new issue. Has oral promethazine ordered (not given). Will give low dose compazine which should help.

## 2023-12-18 NOTE — Anesthesia Procedure Notes (Signed)
Procedure Name: Intubation Date/Time: 12/18/2023 12:37 PM  Performed by: Ayesha Rumpf, CRNAPre-anesthesia Checklist: Patient identified, Emergency Drugs available, Suction available and Patient being monitored Patient Re-evaluated:Patient Re-evaluated prior to induction Oxygen Delivery Method: Circle System Utilized Preoxygenation: Pre-oxygenation with 100% oxygen Induction Type: IV induction Laryngoscope Size: Glidescope and 4 Grade View: Grade II Tube type: Oral Tube size: 7.5 mm Number of attempts: 1 Airway Equipment and Method: Stylet and Oral airway Placement Confirmation: ETT inserted through vocal cords under direct vision, positive ETCO2 and breath sounds checked- equal and bilateral Secured at: 23 cm Tube secured with: Tape Dental Injury: Teeth and Oropharynx as per pre-operative assessment  Comments: Mask ventilation not attempted.

## 2023-12-18 NOTE — Transfer of Care (Signed)
Immediate Anesthesia Transfer of Care Note  Patient: Nicholas Stevens  Procedure(s) Performed: Right Frontal Stereotactic Craniotomy for Resection of Tumor (Right: Head) Application of Cranial  Navigation  Patient Location: PACU  Anesthesia Type:General  Level of Consciousness: drowsy, patient cooperative, and responds to stimulation  Airway & Oxygen Therapy: Patient Spontanous Breathing and Patient connected to face mask oxygen  Post-op Assessment: Report given to RN and Post -op Vital signs reviewed and stable  Post vital signs: Reviewed and stable  Last Vitals:  Vitals Value Taken Time  BP    Temp    Pulse 82 12/18/23 1631  Resp 13 12/18/23 1631  SpO2 95 % 12/18/23 1631  Vitals shown include unfiled device data.  Last Pain:  Vitals:   12/18/23 1038  TempSrc:   PainSc: 0-No pain         Complications: No notable events documented.

## 2023-12-18 NOTE — Progress Notes (Signed)
Subjective: Patient reports mild HA  Objective: Vital signs in last 24 hours: Temp:  [97.8 F (36.6 C)-98.3 F (36.8 C)] 98.3 F (36.8 C) (12/23 1034) Pulse Rate:  [48-90] 58 (12/23 1038) Resp:  [10-22] 17 (12/23 1034) BP: (115-160)/(72-100) 129/100 (12/23 1034) SpO2:  [86 %-96 %] 96 % (12/23 1034) FiO2 (%):  [21 %] 21 % (12/22 2120) Weight:  [115.7 kg-122.3 kg] 115.7 kg (12/23 1034)  Intake/Output from previous day: 12/22 0701 - 12/23 0700 In: 450 [P.O.:450] Out: 1175 [Urine:1175] Intake/Output this shift: Total I/O In: -  Out: 200 [Urine:200]  Sleepy Left arm, leg hemiplegia, minimal pronator drift FC x 4 Ox3  Lab Results: Recent Labs    12/18/23 0433  WBC 8.7  HGB 17.9*  HCT 55.3*  PLT 304   BMET Recent Labs    12/18/23 0433  NA 136  K 4.2  CL 105  CO2 22  GLUCOSE 123*  BUN 16  CREATININE 0.93  CALCIUM 8.8*    Studies/Results: MR BRAIN W WO CONTRAST Result Date: 12/16/2023 CLINICAL DATA:  Brain neoplasm staging EXAM: MRI HEAD WITHOUT AND WITH CONTRAST TECHNIQUE: Multiplanar, multiecho pulse sequences of the brain and surrounding structures were obtained without and with intravenous contrast. CONTRAST:  10mL GADAVIST GADOBUTROL 1 MMOL/ML IV SOLN COMPARISON:  12/15/2023 brain MRI FINDINGS: An abbreviated protocol was performed, consisting of axial, sagittal and coronal T1-weighted images and axial FLAIR sequence. Brain: Unchanged size of large mass centered in the right frontal lobe, measuring 4.7 x 3.8 by 4.8 cm. There is a large amount of surrounding vasogenic edema. 8 mm leftward midline shift. This study was performed for stereotactic localization. IMPRESSION: Unchanged size of large mass centered in the right frontal lobe, measuring 4.7 x 3.8 by 4.8 cm. Large amount of surrounding vasogenic edema. 8 mm leftward midline shift. Electronically Signed   By: Deatra Robinson M.D.   On: 12/16/2023 20:44   CT CHEST ABDOMEN PELVIS W CONTRAST Result Date:  12/16/2023 CLINICAL DATA:  CNS neoplasm.  Staging study.  * Tracking Code: BO * EXAM: CT CHEST, ABDOMEN, AND PELVIS WITH CONTRAST TECHNIQUE: Multidetector CT imaging of the chest, abdomen and pelvis was performed following the standard protocol during bolus administration of intravenous contrast. RADIATION DOSE REDUCTION: This exam was performed according to the departmental dose-optimization program which includes automated exposure control, adjustment of the mA and/or kV according to patient size and/or use of iterative reconstruction technique. CONTRAST:  75mL OMNIPAQUE IOHEXOL 350 MG/ML SOLN COMPARISON:  Chest CTA 03/11/2023. FINDINGS: CT CHEST FINDINGS Cardiovascular: The heart size is normal. No substantial pericardial effusion. No thoracic aortic aneurysm. No substantial atherosclerosis of the thoracic aorta. Mediastinum/Nodes: No mediastinal lymphadenopathy. There is no hilar lymphadenopathy. The esophagus has normal imaging features. There is no axillary lymphadenopathy. Lungs/Pleura: Subsegmental atelectasis noted posterior right lung base. No suspicious pulmonary nodule or mass. No focal airspace consolidation. No pleural effusion. Musculoskeletal: No worrisome lytic or sclerotic osseous abnormality. CT ABDOMEN PELVIS FINDINGS Hepatobiliary: Jen hemangioma identified right hepatic lobe with exophytic hemangioma posterior left hepatic lobe. These were characterized by MRI back in 2014 and are similar in the interval. Gallbladder is decompressed. No intrahepatic or extrahepatic biliary dilation. Pancreas: No focal mass lesion. No dilatation of the main duct. No intraparenchymal cyst. No peripancreatic edema. Spleen: No splenomegaly. No suspicious focal mass lesion. Adrenals/Urinary Tract: No adrenal nodule or mass. Kidneys unremarkable. No evidence for hydroureter. Bladder is decompressed by Foley catheter. Stomach/Bowel: Surgical changes are noted in the stomach, compatible with  sleeve gastrectomy.  Duodenum is normally positioned as is the ligament of Treitz. No small bowel wall thickening. No small bowel dilatation. The terminal ileum is normal. Nonvisualization of the appendix is consistent with the reported history of appendectomy. No gross colonic mass. No colonic wall thickening. Vascular/Lymphatic: There is mild atherosclerotic calcification of the abdominal aorta without aneurysm. There is no gastrohepatic or hepatoduodenal ligament lymphadenopathy. No retroperitoneal or mesenteric lymphadenopathy. No pelvic sidewall lymphadenopathy. Reproductive: Prostate gland upper normal for size. Other: No intraperitoneal free fluid. Musculoskeletal: No worrisome lytic or sclerotic osseous abnormality. IMPRESSION: 1. No evidence for metastatic disease in the chest, abdomen, or pelvis. 2. Surgical changes of sleeve gastrectomy. 3. Giant right cavernous hemangioma with exophytic left hepatic lobe hemangioma. These were characterized by and are similar to abdominal MRI 11/10/2013. 4.  Aortic Atherosclerosis (ICD10-I70.0). Electronically Signed   By: Kennith Center M.D.   On: 12/16/2023 19:20    Assessment/Plan: Right frontal brain mass  Craniotomy today - Risks, benefits, alternatives, and expected convalescence were discussed with her.  Risks discussed included but were not limited to, bleeding, pain, infection, scar, spinal fluid leak, pseudomeningocele, neurologic deficit, nausea, vomiting, and death.  Informed consent was obtained.    Bedelia Person 12/18/2023, 12:10 PM

## 2023-12-18 NOTE — Op Note (Signed)
Operative note:    Indications: This is a 52 yo man  who presented to the hospital with acute left sided weakness and somnolence.  She was found to have large necrotic mass involving right superior frontal gyrus and SMA, premotor regions, with severe swelling and perilesional edema.  On exam he had no volitional strength (0/5) in his left arm and leg, though occasional spontaneous flicker of movement was noted.  I had a long discussion with the patient and family.  I discussed the suspicion of high grade glioma and its natural history.  Given the location of the tumor, he to be a good candidate for respective surgery to maximize his progression-free and overall survival. I emphasized that improvement in his left hemiplegia was unlikely immediately after surgery, but if he has an SMA syndrome, some improvement eventually would be possible.  Alternatives such as biopsy were discussed as well as surveillance but either of these options generally results in poorer survival, and would not address his brain edema.  Risks of surgery were discussed which included bleeding, pain, infection, scar, stroke, neurologic deficit, spinal fluid leak, coma, and death.  She wished to proceed with surgery and informed consent was obtained.     Surgeon: Bedelia Person    Assistants:   Barnett Abu, MD, Patrici Ranks, PA.  Please note no qualified trainees were available to assist with the procedure.  Assistance was required through the entirety of the procedure.     Anesthesia: General endotracheal anesthesia     Procedure Detail   Right CRANIOTOMY for resection of frontal lobe tumor  Intraoperative motor mapping using subcortical stimulation and phase reversal SSEPs.  APPLICATION OF CRANIAL NAVIGATION   The patient was brought to the operating room.  General anesthesia was induced and patient was intubated by the anesthesia service.  TIVA was used.  After appropriate lines and monitors placed, patient was  positioned supine and head was placed in Mayfield head holder and affixed the bed.  Preoperative thin cut MRI was reconstructed into 3D image which was used to perform surface match registration with the Medtronic Stealth system.  This allowed for intraoperative neuronavigation.  A L-shaped incision was planned to span the fairly large lesion.  The scalp was clipped, preprepped with alcohol and prepped and draped in sterile fashion.  1% lidocaine with epinephrine was injected in the skin.  A timeout was performed and preoperative antibiotics, dexamethasone, Keppra, and mannitol were given.  Incision made with a 10 blade and the galea was opened sharply.  Cutaneous flap was flapped forward and held in place with fishhooks.  The coronal suture and sagittal suture were identified and confirmed good accurate registration with the Stealth system.  3 bur holes were placed at the midline and a lateral bur hole was also placed.  The dura was dissected from the inner table of skull and craniotome was used to perform craniotomy.  A strip of Gelfoam was placed over the sagittal sinus.  Meticulous hemostasis was obtained.  The dura was opened in C-shaped manner and flapped medially towards the sinus.  There was a large Trolard vein anteriorly connecting to the dural lateral to the sagittal sinus and a smaller bridging vein posteriorly.  Dural slits were made to allow more medial exposure.   The grossly abnormal superior frontal gyrus was  significantly expanded and appeared red/purple, indicative of underlying disease.  I performed phase reversal SSEPs which confirmed my suspicion we were remote from the central sulcus.  Corticotomy was  performed from the posterior bridging vein to the anterior bridging vein, with an additional cortical vein anterior to the anterior bridging vein, which was skeletonized.  Subpial resection was then performed laterally to the superior frontal sulcus and anteriorly extracapsular subpial resection  was performed.  Posteriorly , subpial extracapsular resection was performed, with intermitted subcortical stimulation revealing we were remote from the corticospinal tracts.  Medially, there was firm nodular tumor tightly adherent to the dura and the sagittal sinus.  Tumor was truncated and shaved down to the sagittal sinus using Sonopat.  Resection continued deep down to just above the cingulate sulcus.  The deep portion of the tumor was dissected extracapsularly, and the tumor was removed in one piece.  Numerous thrombosed veins were noted during white matter dissection, increasing suspicion for underlying high grade glioma.  Good resection was confirmed with the neuronavigation.  Meticulous hemostasis was obtained.  The wound was irrigated thoroughly.  The dura was closed with 4-0 Nurolon in interrupted and running fashion followed by DuraGen plus.  The bone flap was replaced with Stryker cranial plating system.  Medium Hemovac drain was placed in the subgaleal space and tunneled out the skin and secured with stitch.  Skin was closed with 2-0 Vicryl stitches in buried interrupted fashion.  The skin was closed with staples.  Sterile dressing was placed.  Patient was removed from Mayfield head holder and a head wrap was placed.  Expectation is extubation by the anesthesia service.  All counts were correct at the end of surgery.  No complications were noted.   Findings: Successful resection, SSEPs stable.   Estimated Blood Loss:  100 ml         Drains: Subgaleal drain         Total IV Fluids: see anesthesia records   Blood Given: none          Specimens: left frontal lobe mass         Implants: Stryker cranial plating system        Complications:  * No complications entered in OR log *         Disposition: PACU - hemodynamically stable.         Condition: stable

## 2023-12-18 NOTE — Progress Notes (Signed)
   NAME:  Nicholas Stevens, MRN:  [id number], DOB:  June 18, 1971, LOS: 3 ADMISSION DATE:  12/15/2023, CONSULTATION DATE:  12/20 REFERRING MD:  ED, CHIEF COMPLAINT:  necrotic hemorrhagic brain mass   History of Present Illness:  52 year old male with known OSA admitted with right frontal mass with hemorrhage.    Wife at bedside and assisting with history.  Patient noticed a decrease in response time while driving yesterday and new onset episodes of decrease use of left side.  This worsened this morning, with loss of coordination, including where he lost his balance and fell against the wall, denies hitting head or body, prompting to seek further medical treatment. CODE stroke imaging was obtained due to symptoms and revealed a partially  hemorrhagic lesion in the superior right frontal lobe with local mass effect.  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   Pertinent  Medical History  OSA Gastric sleeve surgery Hypertension ( no home meds) Hypogonadism  Significant Hospital Events: Including procedures, antibiotic start and stop dates in addition to other pertinent events   12/20 admit 12/23 right craniotomy for resection of frontal tumor  Interim History / Subjective:  Uneventful surgery today.  Tumor was read moved en bloc.  Suspicion remains high for high-grade glioma. Patient seen in recovery room.  Denies a headache.  Somnolent. Objective   Blood pressure (!) 129/100, pulse (!) 58, temperature 98.3 F (36.8 C), temperature source Oral, resp. rate 17, height 6\' 2"  (1.88 m), weight 115.7 kg, SpO2 96%.    FiO2 (%):  [21 %] 21 %   Intake/Output Summary (Last 24 hours) at 12/18/2023 1707 Last data filed at 12/18/2023 1621 Gross per 24 hour  Intake 1690 ml  Output 2675 ml  Net -985 ml   Filed Weights   12/17/23 0500 12/18/23 0500  12/18/23 1034  Weight: 120.5 kg 122.3 kg 115.7 kg    Examination: General: Adult male lying in bed, somnolent HENT: oral mucosa moist.  Right paramedian incision with Hemovac in place with minimal drainage.  Mayfield head holder skin puncture sites.  No active bleeding. Lungs: Chest clear. Cardiovascular: HS normal Abdomen: soft, non tender Extremities: No deformities Neuro: Somnolent but interactive.  Nods appropriately.  Moves right side spontaneously.  No movement on left today.  Ancillary tests personally reviewed  Excellent glycemic control.  Assessment & Plan:  Right frontal lobe hemorrhagic mass Brain compression OSA Steroid induced in hyperglycemia  Plan:  -Successful tumor resection today.  Continue steroids as per neurosurgery. - Continue current insulin regimen - at risk for steroid hyperglycemia.  -Multimodal pain control.  Minimize narcotic use as patient will not be able to use CPAP initially due to presence of incision.  Do not apply CPAP without first clearing at with neurosurgery.   Best Practice (right click and "Reselect all SmartList Selections" daily)   Diet/type: carb modified diet DVT prophylaxis SCD -plan to start chemical prophylaxis tomorrow if okay by neurosurgery. Pressure ulcer(s): N/A GI prophylaxis: PPI Lines: N/A Foley:  N/A Code Status:  full code Last date of multidisciplinary goals of care discussion [12/20]   Lynnell Catalan, MD [facility] ICU Physician [facility] [place] [facility]  Pager: [phone] Mobile: [phone] After hours: [phone].

## 2023-12-19 ENCOUNTER — Inpatient Hospital Stay (HOSPITAL_COMMUNITY): Payer: Commercial Managed Care - PPO

## 2023-12-19 DIAGNOSIS — G9389 Other specified disorders of brain: Secondary | ICD-10-CM | POA: Diagnosis not present

## 2023-12-19 LAB — CBC WITH DIFFERENTIAL/PLATELET
Abs Immature Granulocytes: 0.08 10*3/uL — ABNORMAL HIGH (ref 0.00–0.07)
Basophils Absolute: 0 10*3/uL (ref 0.0–0.1)
Basophils Relative: 0 %
Eosinophils Absolute: 0 10*3/uL (ref 0.0–0.5)
Eosinophils Relative: 0 %
HCT: 54.5 % — ABNORMAL HIGH (ref 39.0–52.0)
Hemoglobin: 17.7 g/dL — ABNORMAL HIGH (ref 13.0–17.0)
Immature Granulocytes: 1 %
Lymphocytes Relative: 8 %
Lymphs Abs: 0.8 10*3/uL (ref 0.7–4.0)
MCH: 27.1 pg (ref 26.0–34.0)
MCHC: 32.5 g/dL (ref 30.0–36.0)
MCV: 83.3 fL (ref 80.0–100.0)
Monocytes Absolute: 0.9 10*3/uL (ref 0.1–1.0)
Monocytes Relative: 9 %
Neutro Abs: 8.3 10*3/uL — ABNORMAL HIGH (ref 1.7–7.7)
Neutrophils Relative %: 82 %
Platelets: 280 10*3/uL (ref 150–400)
RBC: 6.54 MIL/uL — ABNORMAL HIGH (ref 4.22–5.81)
RDW: 17.2 % — ABNORMAL HIGH (ref 11.5–15.5)
WBC: 10 10*3/uL (ref 4.0–10.5)
nRBC: 0 % (ref 0.0–0.2)

## 2023-12-19 LAB — BASIC METABOLIC PANEL
Anion gap: 11 (ref 5–15)
BUN: 18 mg/dL (ref 6–20)
CO2: 21 mmol/L — ABNORMAL LOW (ref 22–32)
Calcium: 8.4 mg/dL — ABNORMAL LOW (ref 8.9–10.3)
Chloride: 106 mmol/L (ref 98–111)
Creatinine, Ser: 0.94 mg/dL (ref 0.61–1.24)
GFR, Estimated: >60 mL/min (ref >60)
Glucose, Bld: 130 mg/dL — ABNORMAL HIGH (ref 70–99)
Potassium: 3.6 mmol/L (ref 3.5–5.1)
Sodium: 138 mmol/L (ref 135–145)

## 2023-12-19 LAB — GLUCOSE, CAPILLARY
Glucose-Capillary: 164 mg/dL — ABNORMAL HIGH (ref 70–99)
Glucose-Capillary: 196 mg/dL — ABNORMAL HIGH (ref 70–99)
Glucose-Capillary: 140 mg/dL — ABNORMAL HIGH (ref 70–99)
Glucose-Capillary: 148 mg/dL — ABNORMAL HIGH (ref 70–99)

## 2023-12-19 MED ORDER — LEVETIRACETAM 500 MG PO TABS
500.0000 mg | ORAL_TABLET | Freq: Two times a day (BID) | ORAL | Status: DC
  Administered 2023-12-19 – 2023-12-26 (×15): 500 mg via ORAL
  Filled 2023-12-19 (×15): qty 1

## 2023-12-19 MED ORDER — HEPARIN SODIUM (PORCINE) 5000 UNIT/ML IJ SOLN
5000.0000 [IU] | Freq: Three times a day (TID) | INTRAMUSCULAR | Status: DC
  Administered 2023-12-20 – 2023-12-23 (×11): 5000 [IU] via SUBCUTANEOUS
  Filled 2023-12-19 (×12): qty 1

## 2023-12-19 MED ORDER — GADOBUTROL 1 MMOL/ML IV SOLN
10.0000 mL/kg | Freq: Once | INTRAVENOUS | Status: AC | PRN
  Administered 2023-12-19: 1157 mL via INTRAVENOUS

## 2023-12-19 MED ORDER — POLYETHYLENE GLYCOL 3350 17 G PO PACK
17.0000 g | PACK | Freq: Every day | ORAL | Status: DC
  Administered 2023-12-19: 17 g via ORAL
  Filled 2023-12-19: qty 1

## 2023-12-19 MED ORDER — SENNOSIDES-DOCUSATE SODIUM 8.6-50 MG PO TABS
1.0000 | ORAL_TABLET | Freq: Two times a day (BID) | ORAL | Status: DC
  Administered 2023-12-19 – 2023-12-26 (×10): 1 via ORAL
  Filled 2023-12-19 (×14): qty 1

## 2023-12-19 MED FILL — Thrombin For Soln 5000 Unit: CUTANEOUS | Qty: 5000 | Status: AC

## 2023-12-19 NOTE — Evaluation (Addendum)
Physical Therapy Re-Evaluation Patient Details Name: Nicholas Stevens MRN: [id number] DOB: 04-29-1971 Today's Date: 12/19/2023  History of Present Illness  52 yo male presenting 12/20 with decreased response time while driving, incoordination, imbalance. Imaging showed necrotic and hemorrhagic mass in the superior right frontal lobe with features of glioblastoma with mass effect. S/p R craniotomy 12/23. PMH includes: obesity and OSA.  Clinical Impression   Pt presents with L hemiparesis, L inattention, impaired sitting and standing balance, pre-gait difficulty, and decreased activity tolerance. Pt to benefit from acute PT to address deficits. Pt overall requiring mod +2 assist for bed mobility, sitting balance intervention and repeated transfers. Pt tolerating x2 forward steps but unable to progress to gait this date given L weakness and pt fatigue. Patient will benefit from intensive inpatient follow up therapy, >3 hours/day. PT to progress mobility as tolerated, and will continue to follow acutely.        If plan is discharge home, recommend the following: Two people to help with walking and/or transfers;Two people to help with bathing/dressing/bathroom;Assistance with cooking/housework;Direct supervision/assist for medications management;Direct supervision/assist for financial management;Assist for transportation;Help with stairs or ramp for entrance;Supervision due to cognitive status   Can travel by private vehicle        Equipment Recommendations Other (comment) (defer to next venue)  Recommendations for Other Services       Functional Status Assessment Patient has had a recent decline in their functional status and demonstrates the ability to make significant improvements in function in a reasonable and predictable amount of time.     Precautions / Restrictions Precautions Precautions: Fall Precaution Comments: s/p crani, hemovac Restrictions Weight Bearing Restrictions Per  Provider Order: No      Mobility  Bed Mobility Overal bed mobility: Needs Assistance Bed Mobility: Supine to Sit, Sit to Supine Rolling: Mod assist Sidelying to sit: +2 for safety/equipment, +2 for physical assistance, Mod assist       General bed mobility comments: assist for roll towards L, trunk elevation, LLE progression over EOB, correcting L lateral bias in sitting    Transfers Overall transfer level: Needs assistance Equipment used: 2 person hand held assist Transfers: Sit to/from Stand, Bed to chair/wheelchair/BSC Sit to Stand: Mod assist, +2 physical assistance Stand pivot transfers: Mod assist, +2 physical assistance         General transfer comment: assist for rise, steady, and LLE intermittent blocking though pt tends to lock L knee in extension for stability. Cues for upright posture, sequencing stand pivot transfer. stand from EOB x2, transfer towards R.    Ambulation/Gait               General Gait Details: nt  Acupuncturist Bed    Modified Rankin (Stroke Patients Only)       Balance Overall balance assessment: Needs assistance Sitting-balance support: Single extremity supported, Feet supported Sitting balance-Leahy Scale: Poor Sitting balance - Comments: L lateral bias, requiring min to mod truncal assist to correct with mod cuing Postural control: Left lateral lean Standing balance support: Single extremity supported, During functional activity Standing balance-Leahy Scale: Poor Standing balance comment: mod +2                             Pertinent Vitals/Pain Pain Assessment Pain Assessment: No/denies pain Pain Intervention(s): Monitored during session    Home Living Family/patient  expects to be discharged to:: Private residence Living Arrangements: Spouse/significant other Available Help at Discharge: Family;Available 24 hours/day Type of Home: House Home Access: Level entry      Alternate Level Stairs-Number of Steps: 14 Home Layout: Two level;Able to live on main level with bedroom/bathroom Home Equipment: Shower seat;Hand held shower head Additional Comments: goes by DJ, 5 grandbabies    Prior Function Prior Level of Function : Independent/Modified Independent;Working/employed;Driving             Mobility Comments: independent, active ADLs Comments: independent, working in [employer]     Extremity/Trunk Assessment   Upper Extremity Assessment Upper Extremity Assessment: Right hand dominant;LUE deficits/detail LUE Deficits / Details: pt noted to have scapula movement in all planes, shoulder shrug present, visual attention able to move L UE into elbow flexion ( bicep) decreased triceps. pt with digit flexion with decreased extension. AAROM for all elbow, wrist and digit movement LUE Sensation: decreased proprioception;decreased light touch LUE Coordination: decreased fine motor;decreased gross motor    Lower Extremity Assessment Lower Extremity Assessment: Defer to PT evaluation LLE Deficits / Details: 2+/5 knee extension, 2/5 hip abd/add, 0/5 DF/PF LLE Sensation: decreased light touch LLE Coordination: decreased gross motor;decreased fine motor    Cervical / Trunk Assessment Cervical / Trunk Assessment: Other exceptions Cervical / Trunk Exceptions: large body habitus  Communication   Communication Following commands: Follows one step commands consistently;Follows one step commands with increased time  Cognition Arousal: Alert Behavior During Therapy: WFL for tasks assessed/performed Overall Cognitive Status: Impaired/Different from baseline Area of Impairment: Attention, Following commands, Safety/judgement, Problem solving, Awareness                   Current Attention Level: Selective Memory: Decreased short-term memory, Decreased recall of precautions Following Commands: Follows one step commands inconsistently, Follows one  step commands with increased time Safety/Judgement: Decreased awareness of safety, Decreased awareness of deficits   Problem Solving: Slow processing, Difficulty sequencing, Requires verbal cues, Requires tactile cues, Decreased initiation General Comments: L inattention, increasing attention to L with max verbal cuing and by the end of re-eval pt attending to L without cues. Slowed processing with continued session, suspect related to fatigue        General Comments General comments (skin integrity, edema, etc.): SBP 169 post-transfer, RN aware    Exercises     Assessment/Plan    PT Assessment Patient needs continued PT services  PT Problem List Decreased strength;Decreased range of motion;Decreased activity tolerance;Decreased balance;Decreased mobility;Decreased coordination;Decreased cognition;Decreased knowledge of use of DME;Decreased safety awareness;Obesity       PT Treatment Interventions Gait training;DME instruction;Functional mobility training;Therapeutic activities;Therapeutic exercise;Balance training;Neuromuscular re-education;Patient/family education    PT Goals (Current goals can be found in the Care Plan section)  Acute Rehab PT Goals Patient Stated Goal: return to independence PT Goal Formulation: With patient/family Time For Goal Achievement: 01/02/24 Potential to Achieve Goals: Good    Frequency Min 1X/week     Co-evaluation PT/OT/SLP Co-Evaluation/Treatment: Yes Reason for Co-Treatment: For patient/therapist safety;To address functional/ADL transfers PT goals addressed during session: Mobility/safety with mobility;Balance OT goals addressed during session: ADL's and self-care;Strengthening/ROM       AM-PAC PT "6 Clicks" Mobility  Outcome Measure Help needed turning from your back to your side while in a flat bed without using bedrails?: A Lot Help needed moving from lying on your back to sitting on the side of a flat bed without using bedrails?: A  Lot Help needed moving to and from  a bed to a chair (including a wheelchair)?: Total Help needed standing up from a chair using your arms (e.g., wheelchair or bedside chair)?: Total Help needed to walk in hospital room?: Total Help needed climbing 3-5 steps with a railing? : Total 6 Click Score: 8    End of Session   Activity Tolerance: Patient tolerated treatment well Patient left: in chair;with call bell/phone within reach;with family/visitor present;with chair alarm set Nurse Communication: Mobility status PT Visit Diagnosis: Unsteadiness on feet (R26.81);Other abnormalities of gait and mobility (R26.89);Muscle weakness (generalized) (M62.81);Pain;Hemiplegia and hemiparesis Hemiplegia - Right/Left: Left Hemiplegia - dominant/non-dominant: Non-dominant    Time: 9604-5409 PT Time Calculation (min) (ACUTE ONLY): 34 min   Charges:   PT Evaluation $PT Re-evaluation: 1 Re-eval   PT General Charges $$ ACUTE PT VISIT: 1 Visit         Marye Round, PT DPT Acute Rehabilitation Services Secure Chat Preferred  Office [phone]   Sabree Nuon E Christain [place] 12/19/2023, 2:26 PM

## 2023-12-19 NOTE — Progress Notes (Signed)
   NAME:  Nicholas Stevens, MRN:  [id number], DOB:  08/20/1971, LOS: 4 ADMISSION DATE:  12/15/2023, CONSULTATION DATE:  12/20 REFERRING MD:  ED, CHIEF COMPLAINT:  necrotic hemorrhagic brain mass   History of Present Illness:  52 year old male with known OSA admitted with right frontal mass with hemorrhage.    Wife at bedside and assisting with history.  Patient noticed a decrease in response time while driving yesterday and new onset episodes of decrease use of left side.  This worsened this morning, with loss of coordination, including where he lost his balance and fell against the wall, denies hitting head or body, prompting to seek further medical treatment. CODE stroke imaging was obtained due to symptoms and revealed a partially  hemorrhagic lesion in the superior right frontal lobe with local mass effect.  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   Pertinent  Medical History  OSA Gastric sleeve surgery Hypertension ( no home meds) Hypogonadism  Significant Hospital Events: Including procedures, antibiotic start and stop dates in addition to other pertinent events   12/20 admit 12/23 right craniotomy for resection of frontal tumor  Interim History / Subjective:  Uneventful surgery today.  Tumor was read moved en bloc.  Suspicion remains high for high-grade glioma. This morning the patient is in excellent spirits. Mood has improved.  Objective   Blood pressure 119/74, pulse 65, temperature 99.2 F (37.3 C), temperature source Oral, resp. rate 12, height 6\' 2"  (1.88 m), weight 115.7 kg, SpO2 96%.        Intake/Output Summary (Last 24 hours) at 12/19/2023 0755 Last data filed at 12/19/2023 6063 Gross per 24 hour  Intake 1989.9 ml  Output 3280 ml  Net -1290.1 ml   Filed Weights   12/17/23 0500 12/18/23 0500 12/18/23 1034  Weight:  120.5 kg 122.3 kg 115.7 kg    Examination: General: Adult male sitting in bed. Affect is bright.  HENT: oral mucosa moist.  Right paramedian incision with Hemovac in place with minimal drainage.  sites.  No active bleeding. Lungs: Chest clear. Cardiovascular: HS normal Abdomen: soft, non tender Extremities: No deformities Neuro: Wide awake and speaking normally, making jokes.  Now following commands on the left side more briskly with 4/5 strength  Ancillary tests personally reviewed    Assessment & Plan:  Right frontal lobe hemorrhagic mass Brain compression OSA Steroid induced in hyperglycemia  Plan:  -Successful tumor resection today.  Continue steroids as per neurosurgery. - Follow up MRI.  - Continue current insulin regimen - at risk for steroid hyperglycemia.  -Continue pain control regimen. Minimal pain at present.  - Progressive ambulation, full diet.  - Cleared to use CPAP at night.    Best Practice (right click and "Reselect all SmartList Selections" daily)   Diet/type: carb modified diet DVT prophylaxis SCD -plan to start chemical prophylaxis tomorrow if okay by neurosurgery. Pressure ulcer(s): N/A GI prophylaxis: PPI Lines: N/A Foley:  N/A Code Status:  full code Last date of multidisciplinary goals of care discussion [12/20]   Lynnell Catalan, MD [facility] ICU Physician [facility]  Pager: [phone] Mobile: [phone] After hours: [phone].

## 2023-12-19 NOTE — Evaluation (Addendum)
Occupational Therapy Evaluation Patient Details Name: Nicholas Stevens MRN: [id number] DOB: [birth year] Today's Date: 12/19/2023   History of Present Illness 52 yo male presenting 12/20 with decreased response time while driving, incoordination, imbalance. Imaging showed necrotic and hemorrhagic mass in the superior right frontal lobe with features of glioblastoma with mass effect. S/p R craniotomy 12/23. PMH includes: obesity and OSA.   Clinical Impression   Patient is s/p R craniotomy surgery resulting in functional limitations due to the deficits listed below (see OT problem list). Pt indep and working prior to admission. Pt at this time requires two person (A) for transfer with L side deficits. Pt with decreased L UE AROM with digit extension and triceps. OT to monitor for night time splinting needs next session.  Patient will benefit from skilled OT acutely to increase independence and safety with ADLS to allow discharge Patient will benefit from intensive inpatient follow up therapy, >3 hours/day .        If plan is discharge home, recommend the following: Two people to help with walking and/or transfers;Two people to help with bathing/dressing/bathroom    Functional Status Assessment  Patient has had a recent decline in their functional status and demonstrates the ability to make significant improvements in function in a reasonable and predictable amount of time.  Equipment Recommendations  BSC/3in1;Wheelchair (measurements OT);Wheelchair cushion (measurements OT)    Recommendations for Other Services Rehab consult     Precautions / Restrictions Precautions Precautions: Fall Precaution Comments: s/p crani, hemovac Restrictions Weight Bearing Restrictions Per Provider Order: No      Mobility Bed Mobility Overal bed mobility: Needs Assistance Bed Mobility: Supine to Sit, Sit to Supine Rolling: Mod assist Sidelying to sit: +2 for safety/equipment, +2 for physical  assistance, Mod assist       General bed mobility comments: assist for roll towards L, trunk elevation, LLE progression over EOB, correcting L lateral bias in sitting    Transfers Overall transfer level: Needs assistance Equipment used: 2 person hand held assist Transfers: Sit to/from Stand, Bed to chair/wheelchair/BSC Sit to Stand: Mod assist, +2 physical assistance Stand pivot transfers: Mod assist, +2 physical assistance         General transfer comment: assist for rise, steady, and LLE intermittent blocking though pt tends to lock L knee in extension for stability. Cues for upright posture, sequencing stand pivot transfer. stand from EOB x2, transfer towards R. Transfer via Lift Equipment: Stedy    Balance Overall balance assessment: Needs assistance Sitting-balance support: Single extremity supported, Feet supported Sitting balance-Leahy Scale: Poor Sitting balance - Comments: L lateral bias, requiring min to mod truncal assist to correct with mod cuing Postural control: Left lateral lean Standing balance support: Single extremity supported, During functional activity Standing balance-Leahy Scale: Poor Standing balance comment: mod +2                           ADL either performed or assessed with clinical judgement   ADL Overall ADL's : Needs assistance/impaired Eating/Feeding: Set up   Grooming: Wash/dry face;Wash/dry hands;Oral care;Maximal assistance;Sitting   Upper Body Bathing: Maximal assistance;Sitting   Lower Body Bathing: Maximal assistance;Sit to/from stand           Toilet Transfer: +2 for physical assistance;Moderate assistance             General ADL Comments: pt being fed by wife on arrival and wife encouraged to have pt complete task with decreased (A). pt  then feeding himself and attending to plate in L visual field. wife video patient with pt consent during session     Vision Baseline Vision/History: 1 Wears glasses Ability  to See in Adequate Light: 1 Impaired Additional Comments: has glasses but does not wear them often. if he ever wears them he puts them on at night during driving but rarely     Perception         Praxis         Pertinent Vitals/Pain Pain Assessment Pain Assessment: No/denies pain     Extremity/Trunk Assessment Upper Extremity Assessment Upper Extremity Assessment: Right hand dominant;LUE deficits/detail LUE Deficits / Details: pt noted to have scapula movement in all planes, shoulder shrug present, visual attention able to move L UE into elbow flexion ( bicep) decreased triceps. pt with digit flexion with decreased extension. AAROM for all elbow, wrist and digit movement LUE Sensation: decreased proprioception;decreased light touch LUE Coordination: decreased fine motor;decreased gross motor   Lower Extremity Assessment Lower Extremity Assessment: Defer to PT evaluation LLE Deficits / Details: 2+/5 knee extension, 2/5 hip abd/add, 0/5 DF/PF LLE Sensation: decreased light touch LLE Coordination: decreased gross motor;decreased fine motor   Cervical / Trunk Assessment Cervical / Trunk Assessment: Other exceptions Cervical / Trunk Exceptions: large body habitus   Communication Communication Following commands: Follows one step commands consistently;Follows one step commands with increased time   Cognition Arousal: Alert Behavior During Therapy: WFL for tasks assessed/performed Overall Cognitive Status: Impaired/Different from baseline Area of Impairment: Attention, Following commands, Safety/judgement, Problem solving, Awareness                   Current Attention Level: Selective Memory: Decreased short-term memory, Decreased recall of precautions Following Commands: Follows one step commands inconsistently, Follows one step commands with increased time Safety/Judgement: Decreased awareness of safety, Decreased awareness of deficits   Problem Solving: Slow  processing, Difficulty sequencing, Requires verbal cues, Requires tactile cues, Decreased initiation General Comments: L inattention, increasing attention to L with max verbal cuing and by the end of re-eval pt attending to L without cues. Slowed processing with continued session, suspect related to fatigue     General Comments  SBP 169 post-transfer, RN aware    Exercises     Shoulder Instructions      Home Living Family/patient expects to be discharged to:: Private residence Living Arrangements: Spouse/significant other Available Help at Discharge: Family;Available 24 hours/day Type of Home: House Home Access: Level entry     Home Layout: Two level;Able to live on main level with bedroom/bathroom Alternate Level Stairs-Number of Steps: 14 Alternate Level Stairs-Rails: Left Bathroom Shower/Tub: Producer, television/film/video: Handicapped height Bathroom Accessibility: Yes How Accessible: Accessible via walker Home Equipment: Shower seat;Hand held shower head   Additional Comments: goes by DJ, 5 grandbabies      Prior Functioning/Environment Prior Level of Function : Independent/Modified Independent;Working/employed;Driving             Mobility Comments: independent, active ADLs Comments: independent, working in Research scientist (physical sciences) Problem List: Decreased strength;Decreased activity tolerance;Impaired balance (sitting and/or standing);Decreased range of motion;Decreased coordination;Impaired vision/perception;Decreased cognition;Decreased safety awareness;Decreased knowledge of use of DME or AE;Decreased knowledge of precautions;Cardiopulmonary status limiting activity;Impaired sensation;Impaired UE functional use      OT Treatment/Interventions: Self-care/ADL training;Therapeutic exercise;Neuromuscular education;Energy conservation;DME and/or AE instruction;Manual therapy;Modalities;Therapeutic activities;Cognitive remediation/compensation;Visual/perceptual  remediation/compensation;Patient/family education;Balance training    OT Goals(Current goals can be found in the care plan  section) Acute Rehab OT Goals Patient Stated Goal: to be able to use L UE OT Goal Formulation: With patient/family Time For Goal Achievement: 01/02/24  OT Frequency: Min 1X/week    Co-evaluation PT/OT/SLP Co-Evaluation/Treatment: Yes Reason for Co-Treatment: For patient/therapist safety;To address functional/ADL transfers PT goals addressed during session: Mobility/safety with mobility;Balance OT goals addressed during session: ADL's and self-care;Strengthening/ROM      AM-PAC OT "6 Clicks" Daily Activity     Outcome Measure Help from another person eating meals?: A Lot Help from another person taking care of personal grooming?: A Lot Help from another person toileting, which includes using toliet, bedpan, or urinal?: A Lot Help from another person bathing (including washing, rinsing, drying)?: A Lot Help from another person to put on and taking off regular upper body clothing?: A Lot Help from another person to put on and taking off regular lower body clothing?: A Lot 6 Click Score: 12   End of Session Equipment Utilized During Treatment: Gait belt Nurse Communication: Mobility status;Precautions  Activity Tolerance: Patient tolerated treatment well Patient left: in chair;with call bell/phone within reach;with chair alarm set;with nursing/sitter in room  OT Visit Diagnosis: Unsteadiness on feet (R26.81);Muscle weakness (generalized) (M62.81)                Time: 3818-2993 OT Time Calculation (min): 33 min Charges:  OT General Charges $OT Visit: 1 Visit OT Evaluation $OT Re-eval: 1 Re-eval   Brynn, OTR/L  Acute Rehabilitation Services Office: [phone] .   Mateo Flow 12/19/2023, 2:13 PM

## 2023-12-19 NOTE — Progress Notes (Addendum)
Per Neurosurgery on call MD Elsner  Patient is cleared to wear CPAP Signed,  Charmian Muff, RN  12:00 AM 12/19/2023

## 2023-12-19 NOTE — Progress Notes (Signed)
Subjective: Patient reports no headaches  Objective: Vital signs in last 24 hours: Temp:  [97.8 F (36.6 C)-99.2 F (37.3 C)] 98.7 F (37.1 C) (12/24 0800) Pulse Rate:  [48-99] 78 (12/24 0800) Resp:  [12-20] 17 (12/24 0800) BP: (106-157)/(67-100) 142/78 (12/24 0830) SpO2:  [95 %-98 %] 95 % (12/24 0800) Arterial Line BP: (114-171)/(69-91) 133/69 (12/23 2200) Weight:  [115.7 kg] 115.7 kg (12/23 1034)  Intake/Output from previous day: 12/23 0701 - 12/24 0700 In: 1989.9 [P.O.:300; I.V.:1000; IV Piggyback:689.9] Out: 3280 [Urine:3000; Drains:30; Blood:250] Intake/Output this shift: Total I/O In: 100 [IV Piggyback:100] Out: -   NAD More alert, Ox3 Dressing c/d Increased volitional movement in L arm, antigravity maintaining left arm but still sluggish movement to command Drain 30 ml overnight   Lab Results: Recent Labs    12/18/23 0433  WBC 8.7  HGB 17.9*  HCT 55.3*  PLT 304   BMET Recent Labs    12/18/23 0433  NA 136  K 4.2  CL 105  CO2 22  GLUCOSE 123*  BUN 16  CREATININE 0.93  CALCIUM 8.8*    Studies/Results: MR BRAIN W WO CONTRAST Result Date: 12/19/2023 CLINICAL DATA:  Follow-up craniotomy. EXAM: MRI HEAD WITHOUT AND WITH CONTRAST TECHNIQUE: Multiplanar, multiecho pulse sequences of the brain and surrounding structures were obtained without and with intravenous contrast. CONTRAST:  10 mL GADAVIST GADOBUTROL 1 MMOL/ML IV SOLN COMPARISON:  Preoperative imaging 12/16/2023 FINDINGS: Brain: Status post right frontal craniotomy for debulking of a previously seen 5.6 cm hemorrhagic and necrotic mass at the right posterior frontal vertex. Good debulking with collapse of the lesion, the region enhancement around the margins now measuring 3.2 cm cephalo caudal, 2.8 cm front to back and 2.0 cm right to left. Small amount of air or blood products in the central portion. Persistent regional edema as would be expected. Less mass effect. Right-to-left shift now measuring 5  mm. Mass-effect upon the frontal horn of the right lateral ventricle. Other nearby error is of tumor infiltration including the frontal operculum, basal ganglia and cingulate gyrus persist without contrast enhancement in those regions. No hydrocephalus. No extra-axial collection. Vascular: Major vessels at the base of the brain show flow. Skull and upper cervical spine: Negative Sinuses/Orbits: Clear/normal Other: None IMPRESSION: 1. Good debulking of a previously seen 5.6 cm hemorrhagic and necrotic mass at the right posterior frontal vertex. Small amount of air or blood products in the central portion of the lesion. Collapse of the lesion with a region of enhancement now measuring 3.2 x 2.8 x 2.0 cm. Persistent regional edema as would be expected. Less mass effect. Right-to-left shift now measuring 5 mm. Mass-effect upon the frontal horn of the right lateral ventricle. 2. Other nearby areas of tumor infiltration including the frontal operculum, basal ganglia and cingulate gyrus persist without contrast enhancement in those regions. Electronically Signed   By: Paulina Fusi M.D.   On: 12/19/2023 08:58    Assessment/Plan: S/p resection of right premotor/superior frontal gyrus mass, suspicious for high grade glioma - MRI reviewed.  Brain edema considerably better.  Based on intraoperative findings, I suspect the residual contrast enhancement represents hemostatic material in cavity - wean dex - cont keppra - cont drain - pharmacologic DVT ppx to start tomorrow morning   Bedelia Person 12/19/2023, 10:15 AM

## 2023-12-19 NOTE — Progress Notes (Signed)
? ?  Inpatient Rehab Admissions Coordinator : ? ?Per therapy recommendations, patient was screened for CIR candidacy by Blue Ruggerio RN MSN.  At this time patient appears to be a potential candidate for CIR. I will place a rehab consult per protocol for full assessment. Please call me with any questions. ? ?Jerriyah Louis RN MSN ?Admissions Coordinator ?[phone] ?  ?

## 2023-12-20 DIAGNOSIS — G9389 Other specified disorders of brain: Secondary | ICD-10-CM | POA: Diagnosis not present

## 2023-12-20 LAB — GLUCOSE, CAPILLARY
Glucose-Capillary: 203 mg/dL — ABNORMAL HIGH (ref 70–99)
Glucose-Capillary: 141 mg/dL — ABNORMAL HIGH (ref 70–99)
Glucose-Capillary: 118 mg/dL — ABNORMAL HIGH (ref 70–99)
Glucose-Capillary: 156 mg/dL — ABNORMAL HIGH (ref 70–99)

## 2023-12-20 NOTE — Progress Notes (Signed)
   NAME:  Nicholas Stevens, MRN:  [id number], DOB:  07-10-1971, LOS: 5 ADMISSION DATE:  12/15/2023, CONSULTATION DATE:  12/20 REFERRING MD:  ED, CHIEF COMPLAINT:  necrotic hemorrhagic brain mass   History of Present Illness:  52 year old male with known OSA admitted with right frontal mass with hemorrhage.    Wife at bedside and assisting with history.  Patient noticed a decrease in response time while driving yesterday and new onset episodes of decrease use of left side.  This worsened this morning, with loss of coordination, including where he lost his balance and fell against the wall, denies hitting head or body, prompting to seek further medical treatment. CODE stroke imaging was obtained due to symptoms and revealed a partially  hemorrhagic lesion in the superior right frontal lobe with local mass effect.  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   Pertinent  Medical History  OSA Gastric sleeve surgery Hypertension ( no home meds) Hypogonadism  Significant Hospital Events: Including procedures, antibiotic start and stop dates in addition to other pertinent events   12/20 admit 12/23 right craniotomy for resection of frontal tumor  Interim History / Subjective:  Was able to get up to chair with physiotherapy yesterday.  Objective   Blood pressure (!) 130/95, pulse 77, temperature 98.7 F (37.1 C), temperature source Oral, resp. rate 13, height 6\' 2"  (1.88 m), weight 115.7 kg, SpO2 94%.        Intake/Output Summary (Last 24 hours) at 12/20/2023 0951 Last data filed at 12/20/2023 0700 Gross per 24 hour  Intake 720 ml  Output 475 ml  Net 245 ml   Filed Weights   12/17/23 0500 12/18/23 0500 12/18/23 1034  Weight: 120.5 kg 122.3 kg 115.7 kg    Examination: General: Adult male sitting in bed. Affect is brighter. HENT: oral  mucosa moist.  Right paramedian incision with Hemovac in place with minimal drainage which is now largely xanthochromic.[name]  sites.  No active bleeding. Lungs: Chest clear. Cardiovascular: HS normal Abdomen: soft, non tender Extremities: No deformities Neuro: Wide awake and speaking normally although affect flat.  Slow movement left side but with 4/5 strength.  Ancillary tests personally reviewed  Blood sugar a little higher today now that he is eating more.  Assessment & Plan:  Right frontal lobe hemorrhagic mass Brain compression OSA Steroid induced in hyperglycemia  Plan:  -Successful tumor resection today.  Continue steroids as per neurosurgery. - Continue current insulin regimen.  If remains elevated may need increased mealtime insulin. -Continue pain control regimen. Minimal pain at present.  -Continue CPAP use at nighttime. -Continue rehabilitation.  Should be able to transfer out of ICU today.  Best Practice (right click and "Reselect all SmartList Selections" daily)   Diet/type: carb modified diet DVT prophylaxis SCD -started on subcutaneous heparin today. Pressure ulcer(s): N/A GI prophylaxis: PPI Lines: N/A Foley:  N/A Code Status:  full code Last date of multidisciplinary goals of care discussion [12/25]   Lynnell Catalan, MD [facility] ICU Physician [facility] Critical [facility]  Pager: [phone] Mobile: [phone] After hours: [phone].

## 2023-12-20 NOTE — Anesthesia Postprocedure Evaluation (Signed)
Anesthesia Post Note  Patient: Nicholas Stevens  Procedure(s) Performed: Right Frontal Stereotactic Craniotomy for Resection of Tumor (Right: Head) Application of Cranial  Navigation     Patient location during evaluation: PACU Anesthesia Type: General Level of consciousness: awake and alert Pain management: pain level controlled Vital Signs Assessment: post-procedure vital signs reviewed and stable Respiratory status: spontaneous breathing, nonlabored ventilation, respiratory function stable and patient connected to nasal cannula oxygen Cardiovascular status: blood pressure returned to baseline and stable Postop Assessment: no apparent nausea or vomiting Anesthetic complications: no   No notable events documented.  Last Vitals:  Vitals:   12/20/23 1600 12/20/23 1700  BP:  (!) 162/101  Pulse:  70  Resp: 18 16  Temp:  37 C  SpO2:  93%    Last Pain:  Vitals:   12/20/23 1700  TempSrc: Oral  PainSc:                  [place]

## 2023-12-20 NOTE — Progress Notes (Signed)
Patient ID: Nicholas Stevens, male   DOB: [birth year], 52 y.o.   MRN: [id number] BP (!) 144/101   Pulse 79   Temp 98.3 F (36.8 C) (Oral)   Resp (!) 22   Ht 6\' 2"  (1.88 m)   Wt 115.7 kg   SpO2 94%   BMI 32.74 kg/m  Alert and oriented x 4, speech is clear, fluent Moving right side well, some movement on the left. Wound dressing is clean and dry Doing very well post op.

## 2023-12-21 ENCOUNTER — Encounter (HOSPITAL_COMMUNITY): Payer: Self-pay | Admitting: Neurosurgery

## 2023-12-21 LAB — BASIC METABOLIC PANEL
Anion gap: 10 (ref 5–15)
BUN: 18 mg/dL (ref 6–20)
CO2: 22 mmol/L (ref 22–32)
Calcium: 8.6 mg/dL — ABNORMAL LOW (ref 8.9–10.3)
Chloride: 107 mmol/L (ref 98–111)
Creatinine, Ser: 0.86 mg/dL (ref 0.61–1.24)
GFR, Estimated: >60 mL/min (ref >60)
Glucose, Bld: 105 mg/dL — ABNORMAL HIGH (ref 70–99)
Potassium: 4.5 mmol/L (ref 3.5–5.1)
Sodium: 139 mmol/L (ref 135–145)

## 2023-12-21 LAB — GLUCOSE, CAPILLARY
Glucose-Capillary: 105 mg/dL — ABNORMAL HIGH (ref 70–99)
Glucose-Capillary: 131 mg/dL — ABNORMAL HIGH (ref 70–99)
Glucose-Capillary: 234 mg/dL — ABNORMAL HIGH (ref 70–99)

## 2023-12-21 LAB — MAGNESIUM: Magnesium: 2.4 mg/dL (ref 1.7–2.4)

## 2023-12-21 LAB — SURGICAL PATHOLOGY

## 2023-12-21 MED ORDER — POLYETHYLENE GLYCOL 3350 17 G PO PACK
17.0000 g | PACK | Freq: Two times a day (BID) | ORAL | Status: DC
  Filled 2023-12-21 (×6): qty 1

## 2023-12-21 MED ORDER — BISACODYL 10 MG RE SUPP
10.0000 mg | Freq: Once | RECTAL | Status: DC
  Filled 2023-12-21: qty 1

## 2023-12-21 NOTE — TOC CM/SW Note (Signed)
Transition of Care [facility]) - Inpatient Brief Assessment   Patient Details  Name: ELBER GOSCINSKI MRN: [id number] Date of Birth: [birth year]  Transition of Care [facility]) CM/SW Contact:    Mearl Latin, LCSW Phone Number: 12/21/2023, 1:20 PM   Clinical Narrative: Patient is from home with spouse. TOC following for discharge needs and CIR determination.    Transition of Care Asessment: Insurance and Status: Insurance coverage has been reviewed Patient has primary care physician: Yes Home environment has been reviewed: From home Prior level of function:: Independent Prior/Current Home Services: No current home services Social Drivers of Health Review: SDOH reviewed no interventions necessary Readmission risk has been reviewed: Yes Transition of care needs: transition of care needs identified, TOC will continue to follow

## 2023-12-21 NOTE — Progress Notes (Addendum)
    Providing Compassionate, Quality Care - Together   NEUROSURGERY PROGRESS NOTE     S: No issues overnight. Pt reportedly ambulated to bathroom this AM.    O: EXAM:  BP (!) 168/105   Pulse 80   Temp 97.9 F (36.6 C) (Oral)   Resp 17   Ht 6\' 2"  (1.88 m)   Wt 115.7 kg   SpO2 93%   BMI 32.74 kg/m     Awake, alert, oriented  Speech fluent, appropriate  CNs grossly intact  Dressing c/d/I RUE/RLE 5/5 LUE/LLE 3-4/5 Drain removed at bedside w/o acute complication   ASSESSMENT:  52 y.o. with  -R frontal mass s/p craniotomy for resection    PLAN: -Continue to wean Decadron -Supportive care -Appropriate for stepdown -Call w/ questions/concerns.   Patrici Ranks, [facility]

## 2023-12-21 NOTE — Plan of Care (Signed)
  Problem: Education: Goal: Knowledge of General Education information will improve Description: Including pain rating scale, medication(s)/side effects and non-pharmacologic comfort measures Outcome: Progressing   Problem: Health Behavior/Discharge Planning: Goal: Ability to manage health-related needs will improve Outcome: Progressing   Problem: Clinical Measurements: Goal: Ability to maintain clinical measurements within normal limits will improve Outcome: Progressing Goal: Will remain free from infection Outcome: Progressing Goal: Diagnostic test results will improve Outcome: Progressing Goal: Respiratory complications will improve Outcome: Progressing Goal: Cardiovascular complication will be avoided Outcome: Progressing   Problem: Activity: Goal: Risk for activity intolerance will decrease Outcome: Progressing   Problem: Nutrition: Goal: Adequate nutrition will be maintained Outcome: Progressing   Problem: Coping: Goal: Level of anxiety will decrease Outcome: Progressing   Problem: Elimination: Goal: Will not experience complications related to bowel motility Outcome: Progressing Goal: Will not experience complications related to urinary retention Outcome: Progressing   Problem: Pain Management: Goal: General experience of comfort will improve Outcome: Progressing   Problem: Safety: Goal: Ability to remain free from injury will improve Outcome: Progressing   Problem: Skin Integrity: Goal: Risk for impaired skin integrity will decrease Outcome: Progressing   Problem: Education: Goal: Knowledge of the prescribed therapeutic regimen will improve Outcome: Progressing   Problem: Clinical Measurements: Goal: Usual level of consciousness will be regained or maintained. Outcome: Progressing Goal: Neurologic status will improve Outcome: Progressing Goal: Ability to maintain intracranial pressure will improve Outcome: Progressing   Problem: Skin  Integrity: Goal: Demonstration of wound healing without infection will improve Outcome: Progressing

## 2023-12-21 NOTE — Progress Notes (Signed)
   NAME:  Nicholas Stevens, MRN:  [id number], DOB:  1971-01-30, LOS: 6 ADMISSION DATE:  12/15/2023, CONSULTATION DATE:  12/20 REFERRING MD:  ED, CHIEF COMPLAINT:  necrotic hemorrhagic brain mass   History of Present Illness:  52 year old male with known OSA admitted with right frontal mass with hemorrhage.    Wife at bedside and assisting with history.  Patient noticed a decrease in response time while driving yesterday and new onset episodes of decrease use of left side.  This worsened this morning, with loss of coordination, including where he lost his balance and fell against the wall, denies hitting head or body, prompting to seek further medical treatment. CODE stroke imaging was obtained due to symptoms and revealed a partially  hemorrhagic lesion in the superior right frontal lobe with local mass effect.  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   Pertinent  Medical History  OSA Gastric sleeve surgery Hypertension ( no home meds) Hypogonadism  Significant Hospital Events: Including procedures, antibiotic start and stop dates in addition to other pertinent events   12/20 admit 12/23 right craniotomy for resection of frontal tumor  Interim History / Subjective:  Continues to do well.  Making an effort to use left side as much as possible.  Objective   Blood pressure (!) 140/95, pulse 70, temperature 97.9 F (36.6 C), temperature source Oral, resp. rate 11, height 6\' 2"  (1.88 m), weight 115.7 kg, SpO2 94%.        Intake/Output Summary (Last 24 hours) at 12/21/2023 0939 Last data filed at 12/21/2023 0600 Gross per 24 hour  Intake 240 ml  Output 10 ml  Net 230 ml   Filed Weights   12/17/23 0500 12/18/23 0500 12/18/23 1034  Weight: 120.5 kg 122.3 kg 115.7 kg    Examination: General: Adult male sitting in bed HENT: oral mucosa  moist.  Right paramedian incision with Hemovac in place with minimal xanthochromic drainage. Lungs: Chest clear. Cardiovascular: HS normal Abdomen: soft, non tender Extremities: No deformities Neuro: Wide awake and speaking normally affect is improving.  Slow movement left side but with 2/5 strength.  Able to assist in transfer to chair.  Ancillary tests personally reviewed  Blood glucose well-controlled  Assessment & Plan:  Right frontal lobe hemorrhagic mass Brain compression OSA Steroid induced in hyperglycemia  Plan:  -Successful tumor resection today.  Continue steroids as per neurosurgery. - Continue current insulin regimen.   -Continue pain control regimen.  -Continue CPAP use at nighttime. -Continue rehabilitation.  Ready for transfer out of ICU.  Best Practice (right click and "Reselect all SmartList Selections" daily)   Diet/type: carb modified diet DVT prophylaxis SCD -started on subcutaneous heparin 12/25. Pressure ulcer(s): N/A GI prophylaxis: PPI Lines: N/A Foley:  N/A Code Status:  full code Last date of multidisciplinary goals of care discussion [12/25]   Lynnell Catalan, MD [facility] ICU Physician [facility]  Critical Care  Pager: [phone] Mobile: [phone] After hours: [phone].

## 2023-12-21 NOTE — Progress Notes (Signed)
Pt transferred to 4NP 06 from ICU. A&O x 4. Weakness noted to left arm./leg. Denies pain at this time. Lung sounds clear bilat. Oriented to unit. Family at bedside. Callbell within reach.

## 2023-12-21 NOTE — Progress Notes (Signed)
Physical Therapy Treatment Patient Details Name: Nicholas Stevens MRN: [id number] DOB: 12/14/1971 Today's Date: 12/21/2023   History of Present Illness 52 yo male presenting 12/20 with decreased response time while driving, incoordination, imbalance. Imaging showed necrotic and hemorrhagic mass in the superior right frontal lobe with features of glioblastoma with mass effect. S/p R craniotomy 12/23. PMH includes: obesity and OSA.    PT Comments  Pt motivated to progress, pt's family at bedside and supportive. Pt continues to present with L lateral bias in sitting and standing, L inattention, but both are improving. Pt ambulatory in hallway with mod truncal, LLE, and balance assist. Pt continues to be a great rehab candidate post-acutely. Will continue to follow.      If plan is discharge home, recommend the following: Assistance with cooking/housework;Direct supervision/assist for medications management;Direct supervision/assist for financial management;Assist for transportation;Help with stairs or ramp for entrance;Supervision due to cognitive status;A lot of help with walking and/or transfers;A lot of help with bathing/dressing/bathroom   Can travel by private vehicle        Equipment Recommendations  Other (comment) (defer)    Recommendations for Other Services       Precautions / Restrictions Precautions Precautions: Fall Precaution Comments: s/p crani Restrictions Weight Bearing Restrictions Per Provider Order: No     Mobility  Bed Mobility Overal bed mobility: Needs Assistance Bed Mobility: Supine to Sit     Supine to sit: Mod assist     General bed mobility comments: assist for trunk elevation, LE lifting, and scooting to EOB. max sequencing cues    Transfers Overall transfer level: Needs assistance Equipment used: Rolling walker (2 wheels) Transfers: Sit to/from Stand Sit to Stand: Mod assist           General transfer comment: assist for power up,  rise, steadying, and correcting L lateral bias. stand x2, from EOB and recliner    Ambulation/Gait Ambulation/Gait assistance: Mod assist Gait Distance (Feet): 75 Feet Assistive device: Rolling walker (2 wheels), 1 person hand held assist Gait Pattern/deviations: Step-through pattern, Decreased step length - left, Knees buckling, Staggering left, Trunk flexed Gait velocity: decr     General Gait Details: assist to steady, guide RW given pt staggering L. After 10 ft gait with RW, PT placed pt's LUE over PT shoulders and facilitated trunk and LLE without AD. Gait without AD requiring assist to steady, correct L lateral bias, intermittent L knee extension facilitation. cues for "big steps"   Stairs             Wheelchair Mobility     Tilt Bed    Modified Rankin (Stroke Patients Only)       Balance Overall balance assessment: Needs assistance Sitting-balance support: Single extremity supported, Feet supported Sitting balance-Leahy Scale: Fair Sitting balance - Comments: L lateral bias, pt can correct and sit unsupported Postural control: Left lateral lean Standing balance support: Single extremity supported, During functional activity Standing balance-Leahy Scale: Poor                              Cognition Arousal: Alert Behavior During Therapy: WFL for tasks assessed/performed Overall Cognitive Status: Impaired/Different from baseline Area of Impairment: Attention, Following commands, Safety/judgement, Problem solving, Awareness                   Current Attention Level: Selective Memory: Decreased short-term memory, Decreased recall of precautions Following Commands: Follows one step commands inconsistently, Follows one  step commands with increased time Safety/Judgement: Decreased awareness of safety   Problem Solving: Slow processing, Difficulty sequencing, Requires verbal cues, Requires tactile cues, Decreased initiation General Comments: L  inattention improving        Exercises      General Comments        Pertinent Vitals/Pain Pain Assessment Pain Assessment: Faces Faces Pain Scale: Hurts little more Pain Location: headache Pain Descriptors / Indicators: Headache Pain Intervention(s): Limited activity within patient's tolerance, Monitored during session, Repositioned    Home Living                          Prior Function            PT Goals (current goals can now be found in the care plan section) Acute Rehab PT Goals Patient Stated Goal: return to independence PT Goal Formulation: With patient/family Time For Goal Achievement: 01/02/24 Potential to Achieve Goals: Good Progress towards PT goals: Progressing toward goals    Frequency    Min 1X/week      PT Plan      Co-evaluation              AM-PAC PT "6 Clicks" Mobility   Outcome Measure  Help needed turning from your back to your side while in a flat bed without using bedrails?: A Little Help needed moving from lying on your back to sitting on the side of a flat bed without using bedrails?: A Lot Help needed moving to and from a bed to a chair (including a wheelchair)?: A Lot Help needed standing up from a chair using your arms (e.g., wheelchair or bedside chair)?: A Lot Help needed to walk in hospital room?: A Lot Help needed climbing 3-5 steps with a railing? : Total 6 Click Score: 12    End of Session Equipment Utilized During Treatment: Gait belt Activity Tolerance: Patient tolerated treatment well Patient left: in chair;with call bell/phone within reach;with family/visitor present;Other (comment) (family at bedside, will call RN when pt is ready to go back to bed) Nurse Communication: Mobility status PT Visit Diagnosis: Unsteadiness on feet (R26.81);Other abnormalities of gait and mobility (R26.89);Muscle weakness (generalized) (M62.81);Pain;Hemiplegia and hemiparesis Hemiplegia - Right/Left: Left Hemiplegia -  dominant/non-dominant: Non-dominant Hemiplegia - caused by: Unspecified (brain tumor s/p resection)     Time: 6213-0865 PT Time Calculation (min) (ACUTE ONLY): 20 min  Charges:    $Gait Training: 8-22 mins PT General Charges $$ ACUTE PT VISIT: 1 Visit                     Marye Round, PT DPT Acute Rehabilitation Services Secure Chat Preferred  Office [phone]    Keyleigh Manninen Sheliah Plane 12/21/2023, 3:33 PM

## 2023-12-21 NOTE — PMR Pre-admission (Signed)
PMR Admission Coordinator Pre-Admission Assessment  Patient: Nicholas Stevens is an 52 y.o., male MRN: [id number] DOB: March 12, 1971 Height: 6\' 2"  (188 cm) Weight: 120.6 kg  Insurance Information HMO:     PPO: no     PCP:      IPA:      80/20:      OTHER:  PRIMARY: [employer]      Policy#: [id number]       Subscriber: pt's spouse  CM Name: Misty Stanley      Phone#: [phone]     Fax#: [phone] Pre-Cert#: [id number]   Received insurance approval on 12/22/23 from [employer]. Pt approved for 7 days from 12/22/23-12/29/23. Updates due on 12/28/22.   Employer:  Benefits:  Phone #: [email or web address]     Name:  Eff. Date: 12/26/22-12/26/23     Deduct: $500 ($500 met)      Out of Pocket Max: $2,000 ($2,000 met)      Life Max: NA CIR: 80% coverage, 20% co-insurance      SNF: 80% coverage, 20% co-insurance Outpatient: 80% coverage     Co-Pay: 20% co-insurance Home Health: 80% coverage      Co-Pay: 20% co-insurance DME: 80% coverage     Co-Pay: 20% co-insurance Providers: in-network SECONDARY:       Policy#:      Phone#:   Artist:       Phone#:   The Data processing manager" for patients in Inpatient Rehabilitation Facilities with attached "Privacy Act Statement-Health Care Records" was provided and verbally reviewed with: Patient and Family  Emergency Contact Information Contact Information     Name Relation Home Work Mobile   [place] Spouse [phone]  [phone]   Pole,Gilda Mother [phone]  [phone]      Other Contacts   None on File     Current Medical History  Patient Admitting Diagnosis: brain mass   History of Present Illness: Pt is a 52 y/o male with PMH of obesity and OSA who was admitted to [facility] on 12/20 with decreased response time, L hemiparesis, 8/10 HA.  He did have a fall the day of admission without injury.  In ED he was hemodynamically stable.  Labs notable for Hgb 17.3, but otherwise not concerning.  Imaging revealed R superior frontal  lobe mass with surrounding vasogenic edema, suspicious for glioblastoma.  Neurosurgery was consulted and recommended Decadron, metastatic workup, tight BP control, and surgical resection, which was performed by DDr. Maisie Fus on 12/23.  CT chest/abdomen/pelvis showed no concern for metastatic disease.  Therapy evaluations completed and pt was recommended for CIR.   Complete NIHSS TOTAL: 2  Patient's medical record from [facility] has been reviewed by the rehabilitation admission coordinator and physician.  Past Medical History  Past Medical History:  Diagnosis Date   DDD (degenerative disc disease), lumbar    HNP (herniated nucleus pulposus)    Hypertension    Liver hemangioma    Morbid obesity (HCC)    OSA (obstructive sleep apnea)    no cpap used since weight loss   Overweight(278.02)    Plantar fasciitis of right foot    Sleep apnea    Tinea barbae     Has the patient had major surgery during 100 days prior to admission? Yes  Family History   family history includes Hypertension in his father; Liver disease in his father; Other in his mother.  Current Medications  Current Facility-Administered Medications:    acetaminophen (TYLENOL) tablet 650 mg, 650 mg, Oral, Q4H PRN,  650 mg at 12/19/23 1723 **OR** acetaminophen (TYLENOL) suppository 650 mg, 650 mg, Rectal, Q4H PRN, Patrici Ranks Caylin, PA-C   bisacodyl (DULCOLAX) suppository 10 mg, 10 mg, Rectal, Once, Agarwala, Ravi, MD   [COMPLETED] dexamethasone (DECADRON) tablet 4 mg, 4 mg, Oral, Q6H, 4 mg at 12/20/23 1316 **FOLLOWED BY** [COMPLETED] dexamethasone (DECADRON) tablet 4 mg, 4 mg, Oral, Q8H, 4 mg at 12/22/23 1418 **FOLLOWED BY** dexamethasone (DECADRON) tablet 4 mg, 4 mg, Oral, Q12H, 4 mg at 12/22/23 2134 **FOLLOWED BY** [START ON 12/24/2023] dexamethasone (DECADRON) tablet 2 mg, 2 mg, Oral, Q12H **FOLLOWED BY** [START ON 12/27/2023] dexamethasone (DECADRON) tablet 2 mg, 2 mg, Oral, Daily, Esperanza Richters, Sara Caylin, PA-C   docusate  sodium (COLACE) capsule 100 mg, 100 mg, Oral, BID PRN, Earney Hamburg B, NP   heparin injection 5,000 Units, 5,000 Units, Subcutaneous, Q8H, Bedelia Person, MD, 5,000 Units at 12/23/23 0617   hydrALAZINE (APRESOLINE) injection 10 mg, 10 mg, Intravenous, Q4H PRN, Earney Hamburg B, NP, 10 mg at 12/19/23 1107   HYDROcodone-acetaminophen (NORCO/VICODIN) 5-325 MG per tablet 1 tablet, 1 tablet, Oral, Q4H PRN, Barnett Abu, MD, 1 tablet at 12/21/23 1244   HYDROmorphone (DILAUDID) injection 0.5-1 mg, 0.5-1 mg, Intravenous, Q2H PRN, Patrici Ranks Caylin, PA-C   insulin aspart (novoLOG) injection 0-9 Units, 0-9 Units, Subcutaneous, TID WC, Earney Hamburg B, NP, 2 Units at 12/22/23 1742   insulin aspart (novoLOG) injection 5 Units, 5 Units, Subcutaneous, TID WC, Agarwala, Daleen Bo, MD, 5 Units at 12/23/23 0805   labetalol (NORMODYNE) injection 10-40 mg, 10-40 mg, Intravenous, Q10 min PRN, Patrici Ranks Caylin, PA-C, 20 mg at 12/19/23 1125   levETIRAcetam (KEPPRA) tablet 500 mg, 500 mg, Oral, BID, Agarwala, Ravi, MD, 500 mg at 12/22/23 2134   ondansetron (ZOFRAN) tablet 4 mg, 4 mg, Oral, Q4H PRN **OR** ondansetron (ZOFRAN) injection 4 mg, 4 mg, Intravenous, Q4H PRN, Clovis Riley, PA-C   Oral care mouth rinse, 15 mL, Mouth Rinse, PRN, Luciano Cutter, MD   pantoprazole (PROTONIX) EC tablet 40 mg, 40 mg, Oral, Daily, Agarwala, Ravi, MD, 40 mg at 12/22/23 0807   polyethylene glycol (MIRALAX / GLYCOLAX) packet 17 g, 17 g, Oral, BID, Agarwala, Ravi, MD   promethazine (PHENERGAN) tablet 12.5-25 mg, 12.5-25 mg, Oral, Q4H PRN, Patrici Ranks Caylin, PA-C, 25 mg at 12/20/23 2703   senna-docusate (Senokot-S) tablet 1 tablet, 1 tablet, Oral, BID, Agarwala, Ravi, MD, 1 tablet at 12/22/23 2134   sodium phosphate (FLEET) enema 1 enema, 1 enema, Rectal, Once PRN, Clovis Riley, PA-C   zolpidem (AMBIEN) tablet 5 mg, 5 mg, Oral, QHS PRN, Lynnell Catalan, MD, 5 mg at 12/22/23 2134  Patients Current  Diet:  Diet Order             Diet Carb Modified Fluid consistency: Thin; Room service appropriate? Yes  Diet effective now                   Precautions / Restrictions Precautions Precautions: Fall Precaution Comments: s/p crani Restrictions Weight Bearing Restrictions Per Provider Order: No   Has the patient had 2 or more falls or a fall with injury in the past year? No  Prior Activity Level Community (5-7x/wk): fully indpendent, no DME, driving, retired from [employer], very active, outdoorsy  Prior Functional Level Self Care: Did the patient need help bathing, dressing, using the toilet or eating? Independent  Indoor Mobility: Did the patient need assistance with walking from room to room (with or without device)? Independent  Stairs: Did the patient need assistance with internal or external stairs (with or without device)? Independent  Functional Cognition: Did the patient need help planning regular tasks such as shopping or remembering to take medications? Independent  Patient Information Are you of Hispanic, Latino/a,or Spanish origin?: A. No, not of Hispanic, Latino/a, or Spanish origin What is your race?: B. Black or African American Do you need or want an interpreter to communicate with a doctor or health care staff?: 0. No  Patient's Response To:  Health Literacy and Transportation Is the patient able to respond to health literacy and transportation needs?: Yes Health Literacy - How often do you need to have someone help you when you read instructions, pamphlets, or other written material from your doctor or pharmacy?: Never In the past 12 months, has lack of transportation kept you from medical appointments or from getting medications?: No In the past 12 months, has lack of transportation kept you from meetings, work, or from getting things needed for daily living?: No  Journalist, newspaper / Equipment Home Equipment: Shower seat, Hand held shower  head  Prior Device Use: Indicate devices/aids used by the patient prior to current illness, exacerbation or injury? None of the above  Current Functional Level Cognition  Overall Cognitive Status:  (NT formally, inattension to the L much improved) Current Attention Level: Selective Orientation Level: Oriented X4 Following Commands: Follows one step commands inconsistently, Follows one step commands with increased time Safety/Judgement: Decreased awareness of safety General Comments: L inattention improving    Extremity Assessment (includes Sensation/Coordination)  Upper Extremity Assessment: Right hand dominant, LUE deficits/detail LUE Deficits / Details: pt noted to have scapula movement in all planes, shoulder shrug present, visual attention able to move L UE into elbow flexion ( bicep) decreased triceps. pt with digit flexion with decreased extension. AAROM for all elbow, wrist and digit movement LUE Sensation: decreased proprioception, decreased light touch LUE Coordination: decreased fine motor, decreased gross motor  Lower Extremity Assessment: Defer to PT evaluation LLE Deficits / Details: 2+/5 knee extension, 2/5 hip abd/add, 0/5 DF/PF LLE Sensation: decreased light touch LLE Coordination: decreased gross motor, decreased fine motor    ADLs  Overall ADL's : Needs assistance/impaired Eating/Feeding: Set up Grooming: Wash/dry face, Wash/dry hands, Oral care, Maximal assistance, Sitting Upper Body Bathing: Maximal assistance, Sitting Lower Body Bathing: Maximal assistance, Sit to/from stand Toilet Transfer: +2 for physical assistance, Moderate assistance General ADL Comments: pt being fed by wife on arrival and wife encouraged to have pt complete task with decreased (A). pt then feeding himself and attending to plate in L visual field. wife video patient with pt consent during session    Mobility  Overal bed mobility: Needs Assistance Bed Mobility: Supine to Sit Rolling: Mod  assist Sidelying to sit: +2 for safety/equipment, +2 for physical assistance, Mod assist Supine to sit: Min assist General bed mobility comments: pt sat up via R elbow, balanced against raised HOB while donning R then L sock.  Needed assist of expanding sock over all toes L foot.  Pt then moved and assisted his L LE over the edge and used bil UE's to square up on EOB.  Min assist overall whether stability, L LE assist or stabilizing/assist with donning L sock.    Transfers  Overall transfer level: Needs assistance Equipment used: 1 person hand held assist Transfers: Sit to/from Stand Sit to Stand: Min assist (heavy min) Bed to/from chair/wheelchair/BSC transfer type:: Stand pivot Stand pivot transfers: Mod assist, +2 physical  assistance Transfer via Lift Equipment: [employer] transfer comment: cues for L UE assist, also over min stability assist after up in standing.    Ambulation / Gait / Stairs / Wheelchair Mobility  Ambulation/Gait Ambulation/Gait assistance: Mod assist Gait Distance (Feet): 140 Feet Assistive device: 1 person hand held assist (L UE over therapists shoulder) Gait Pattern/deviations: Step-through pattern, Decreased step length - right, Decreased step length - left, Decreased stride length General Gait Details: No AD used, L UE over therapist's shd.  generally light mod assist for stability, cuing/assist fro w/shifts--unweighting appropriately, cues for heel toe pattern.  cues for side stepping and stepping backward. Gait velocity: decr Gait velocity interpretation: <1.8 ft/sec, indicate of risk for recurrent falls Pre-gait activities: At EOB, worked on standing with decreasing assist, w/shifts, full weighting/unweighting.  side-stepping, turning 360 each direction.  All with cues for w/shifts and lining up L LE/foot appropriately    Posture / Balance Dynamic Sitting Balance Sitting balance - Comments: L lateral bias, pt can correct and sit  unsupported Balance Overall balance assessment: Needs assistance Sitting-balance support: Single extremity supported, Feet supported Sitting balance-Leahy Scale: Fair Sitting balance - Comments: L lateral bias, pt can correct and sit unsupported Postural control: Left lateral lean Standing balance support: Single extremity supported, During functional activity Standing balance-Leahy Scale: Poor Standing balance comment: still reliant on external support, but much improved    Special needs/care consideration CPAP and Skin surgical incision/crani   Previous Home Environment (from acute therapy documentation) Living Arrangements: Spouse/significant other Available Help at Discharge: Family, Available 24 hours/day Type of Home: House Home Layout: Two level, Able to live on main level with bedroom/bathroom Alternate Level Stairs-Rails: Left Alternate Level Stairs-Number of Steps: 14 Home Access: Level entry Bathroom Shower/Tub: Health visitor: Handicapped height Bathroom Accessibility: Yes How Accessible: Accessible via walker Home Care Services: No Additional Comments: goes by DJ, 5 grandbabies  Discharge Living Setting Plans for Discharge Living Setting: Patient's home, Lives with (comment) (spouse) Type of Home at Discharge: House Discharge Home Layout: Able to live on main level with bedroom/bathroom Discharge Home Access: Level entry Discharge Bathroom Shower/Tub: Walk-in shower Discharge Bathroom Toilet: Handicapped height Discharge Bathroom Accessibility: Yes How Accessible: Accessible via walker Does the patient have any problems obtaining your medications?: No  Social/Family/Support Systems Anticipated Caregiver: Mervyn Gay (spouse) Anticipated Caregiver's Contact Information: [phone] Ability/Limitations of Caregiver: none stated Caregiver Availability: 24/7 Discharge Plan Discussed with Primary Caregiver: Yes Is Caregiver In Agreement with Plan?:  Yes Does Caregiver/Family have Issues with Lodging/Transportation while Pt is in Rehab?: No  Goals Patient/Family Goal for Rehab: PT/OT/SLP supervision Expected length of stay: 10-14 days Additional Information: Discharge plan: home with spoues to provide 24/7 Pt/Family Agrees to Admission and willing to participate: Yes Program Orientation Provided & Reviewed with Pt/Caregiver Including Roles  & Responsibilities: Yes  Decrease burden of Care through IP rehab admission: n/a  Possible need for SNF placement upon discharge: not anticipated.  Plan for discharge home with spouse 24/7.   Patient Condition: I have reviewed medical records from [facility], spoken with CM, and patient and spouse. I discussed via phone for inpatient rehabilitation assessment.  Patient will benefit from ongoing PT, OT, and SLP, can actively participate in 3 hours of therapy a day 5 days of the week, and can make measurable gains during the admission.  Patient will also benefit from the coordinated team approach during an Inpatient Acute Rehabilitation admission.  The patient will receive intensive therapy as well as Rehabilitation physician,  nursing, Child psychotherapist, and care management interventions.  Due to safety, skin/wound care, disease management, medication administration, pain management, and patient education the patient requires 24 hour a day rehabilitation nursing.  The patient is currently min to mod assist with mobility and basic ADLs.  Discharge setting and therapy post discharge at  home  is anticipated.  Patient has agreed to participate in the Acute Inpatient Rehabilitation Program and will admit today.  Preadmission Screen Completed By:  Stephania Fragmin, 12/23/2023 9:31 AM ______________________________________________________________________   Discussed status with Dr. Riley Kill on 12/23/23 at 9:31 AM and received approval for admission today.  Admission Coordinator:  Stephania Fragmin, PT, time 9:31 AM/Date  12/23/23    Assessment/Plan: Diagnosis: right frontal gbm Does the need for close, 24 hr/day Medical supervision in concert with the patient's rehab needs make it unreasonable for this patient to be served in a less intensive setting? Yes Co-Morbidities requiring supervision/potential complications: OSA, post-op pain, oncology needs Due to bladder management, bowel management, safety, skin/wound care, disease management, medication administration, pain management, and patient education, does the patient require 24 hr/day rehab nursing? Yes Does the patient require coordinated care of a physician, rehab nurse, PT, OT, and SLP to address physical and functional deficits in the context of the above medical diagnosis(es)? Yes Addressing deficits in the following areas: balance, endurance, locomotion, strength, transferring, bowel/bladder control, bathing, dressing, feeding, grooming, toileting, cognition, and psychosocial support Can the patient actively participate in an intensive therapy program of at least 3 hrs of therapy 5 days a week? Yes The potential for patient to make measurable gains while on inpatient rehab is excellent Anticipated functional outcomes upon discharge from inpatient rehab: supervision PT, supervision OT, supervision SLP Estimated rehab length of stay to reach the above functional goals is: 10-14 days Anticipated discharge destination: Home 10. Overall Rehab/Functional Prognosis: excellent   MD Signature: Ranelle Oyster, MD, [facility] Physical Medicine & Rehabilitation Medical Director Rehabilitation Services 12/23/2023

## 2023-12-21 NOTE — Progress Notes (Signed)
Inpatient Rehab Coordinator Note:  I spoke with patient's spouse, Mervyn Gay, over the phone to discuss CIR recommendations and goals/expectations of CIR stay.  We reviewed 3 hrs/day of therapy, physician follow up, and average length of stay 2 weeks (dependent upon progress) with goals of supevision.  She can provide expected level of care at discharge.  We reviewed need for insurance approval and I will start that process today.  We will follow.   Estill Dooms, PT, DPT Admissions Coordinator [phone] 12/21/23  3:13 PM

## 2023-12-22 ENCOUNTER — Other Ambulatory Visit: Payer: Self-pay | Admitting: Radiation Therapy

## 2023-12-22 DIAGNOSIS — G9389 Other specified disorders of brain: Secondary | ICD-10-CM | POA: Diagnosis not present

## 2023-12-22 LAB — GLUCOSE, CAPILLARY
Glucose-Capillary: 146 mg/dL — ABNORMAL HIGH (ref 70–99)
Glucose-Capillary: 137 mg/dL — ABNORMAL HIGH (ref 70–99)
Glucose-Capillary: 181 mg/dL — ABNORMAL HIGH (ref 70–99)
Glucose-Capillary: 187 mg/dL — ABNORMAL HIGH (ref 70–99)
Glucose-Capillary: 179 mg/dL — ABNORMAL HIGH (ref 70–99)

## 2023-12-22 NOTE — Progress Notes (Signed)
    Providing Compassionate, Quality Care - Together   NEUROSURGERY PROGRESS NOTE     S: NAEs o/n.    O: EXAM:  BP 131/84 (BP Location: Left Arm)   Pulse 72   Temp 98 F (36.7 C) (Axillary)   Resp 13   Ht 6\' 2"  (1.88 m)   Wt 123.9 kg   SpO2 95%   BMI 35.07 kg/m     Awake, alert, oriented  Speech fluent, appropriate  CNs grossly intact  Dressing c/d/I RUE/RLE 5/5 LUE/LLE 3-4/5   ASSESSMENT:  52 y.o. with   -R frontal mass s/p craniotomy for resection     PLAN: -Continue to wean Decadron -Supportive care -Call w/ questions/concerns.   Patrici Ranks, [facility]

## 2023-12-22 NOTE — Progress Notes (Signed)
Inpatient Rehab Admissions Coordinator:  Awaiting insurance authorization. Will continue to follow.   Gayland Curry, [place], [place] Admissions Coordinator [phone]

## 2023-12-22 NOTE — H&P (Addendum)
 Physical Medicine and Rehabilitation Admission H&P    Chief Complaint  Patient presents with   Functional deficits. Due to brain tumor    HPI:  Nicholas Stevens is a 52 year old male with history of OSA, HTN, Tinea barbae, obesity s/p gastric sleeve, liver hemangioma who was admitted on 12/15/23 with reports of decrease in coordination, minimal use of LUE/LLE and fall prompting treatment. MRI brain done revealing necrotic, hemorrhagic mass in superior right frontal lobe with features of glioblastoma and mass effect with very early right uncal herniation. Dr. Debby evaluated patient and recommended IV decadron  to reduce edema prior to resective surgery, SBP < 150 as well as metastatic work up. CT chest, abdomen, pelvis was negative for metastatic disease but showed giant right cavernous hemangioma with  exophytic left hepatic lobe hemangioma similar to MRI 10/2013. Follow up MRI brain 12/21 showed unchanged size of 4.7 X 3.8 X 4.8 cm mass with large vasogenic edema and 8 mm leftward shift.   Steroid induced hyperglycemia managed with novolog  and SSI. He underwent right frontal stereotactic  craniotomy for resection of frontal lobe tumor on 12/23 by Dr. Debby.  Post op has had improvement in LUE strength and follow up MRI showed good debulking of tumor with decrease in mass effect, collapse of lesion with region of enhancement  and 5 mm right to left shift. Pathology revealed high grade glioma grade 4 c/w glioblastoma. Was discussed by tumor board yesterday? Decadron  wean completed today. He has had improvement in left sided weakness as reported to have flaccid hemiplegia at admission. Therapy has been working with patient and he reported severe left calf pain with mobility on 12/22/23.   He was found to have calf tenderness with positive Hoffman's and doppler done revealed acute DVT in left peroneal veins. He is felt to be at high risk for clot propagation due to coagulopathy and was started  on IV heparin  on 12/28 per NS input. He has been stable on this dose with follow up CT head 12/31 showing post op changes with expected small amount of bleed products in resection cavity and similar vasogenic edema with partial effacement of right lateral ventricle.  He continues to be limited by left sided weakness, left inattention, has balance deficits with decreased safety awareness and is requiring min assist overall. He was independent and working PTA.  CIR recommended due to functional decline.    Pt reports no pain- maybe a little sore.  LBM today- no constipation.    Review of Systems  Constitutional:  Negative for chills and fever.  HENT:  Negative for hearing loss.   Eyes:  Negative for blurred vision and double vision.  Respiratory:  Negative for cough and shortness of breath.   Gastrointestinal:  Negative for heartburn and nausea.  Genitourinary:  Negative for dysuria.  Musculoskeletal:  Positive for joint pain (right hip pain/tightness at times.) and myalgias (left calf pain).  Skin:  Negative for rash.  Neurological:  Positive for weakness. Negative for dizziness and headaches.  Psychiatric/Behavioral:  The patient has insomnia. The patient is not nervous/anxious.   All other systems reviewed and are negative.    Past Medical History:  Diagnosis Date   DDD (degenerative disc disease), lumbar    HNP (herniated nucleus pulposus)    Hypertension    Liver hemangioma    Morbid obesity (HCC)    OSA (obstructive sleep apnea)    no cpap used since weight loss   Overweight(278.02)  Plantar fasciitis of right foot    Sleep apnea    Tinea barbae     Past Surgical History:  Procedure Laterality Date   BREATH TEK H PYLORI N/A 08/20/2013   Procedure: BREATH TEK H PYLORI;  Surgeon: Donnice KATHEE Lunger, MD;  Location: [facility];  Service: General;  Laterality: N/A;   COLONOSCOPY WITH PROPOFOL  N/A 05/05/2022   Procedure: COLONOSCOPY WITH PROPOFOL ;  Surgeon: Albertus Gordy HERO, MD;   Location: [facility];  Service: Gastroenterology;  Laterality: N/A;   CRANIOTOMY Right 12/18/2023   Procedure: Right Frontal Stereotactic Craniotomy for Resection of Tumor;  Surgeon: Debby Dorn MATSU, MD;  [facility];  Service: Neurosurgery;  Laterality: Right;   ESOPHAGOGASTRODUODENOSCOPY (EGD) WITH PROPOFOL  N/A 03/15/2023   Procedure: ESOPHAGOGASTRODUODENOSCOPY (EGD) WITH PROPOFOL ;  Surgeon: Avram Lupita BRAVO, MD;  Location: [facility];  Service: Gastroenterology;  Laterality: N/A;   LAPAROSCOPIC APPENDECTOMY  2007   Dr Curvin   LAPAROSCOPIC GASTRIC SLEEVE RESECTION N/A 10/08/2013   Procedure: LAPAROSCOPIC SLEEVE GASTRECTOMY;  Surgeon: Donnice KATHEE Lunger, MD;  Location: [facility];  Service: General;  Laterality: N/A;   LUMBAR LAMINECTOMY/DECOMPRESSION MICRODISCECTOMY N/A 09/16/2015   Procedure: MICRO LUMBAR DECOMPRESSION, MICRODISCECTOMY L5 - S1;  Surgeon: Reyes Billing, MD;  Location: [facility];  Service: Orthopedics;  Laterality: N/A;   POLYPECTOMY  05/05/2022   Procedure: POLYPECTOMY;  Surgeon: Albertus Gordy HERO, MD;  Location: [facility];  Service: Gastroenterology;;   ROTATOR CUFF REPAIR Right 2005   Dr Harden    Family History  Problem Relation Age of Onset   Other Mother        hx of DJD   Liver disease Father        ETOH, Hep C   Hypertension Father    Colon cancer Neg Hx    Colon polyps Neg Hx    Esophageal cancer Neg Hx    Rectal cancer Neg Hx    Stomach cancer Neg Hx     Social History: Married. Used to work for [employer] and now is a Scientist, Research (physical Sciences). He  reports that he has never smoked. He has never been exposed to tobacco smoke. He has never used smokeless tobacco. He reports that he does not drink alcohol  and does not use drugs.   Allergies: No Known Allergies   Medications Prior to Admission  Medication Sig Dispense Refill   Cholecalciferol (VITAMIN D3) 2000 units capsule Take 1 capsule (2,000 Units total) daily by mouth. 100 capsule 3   Cyanocobalamin  (B-12 PO) Take 1 tablet  by mouth daily.     doxycycline  (VIBRA -TABS) 100 MG tablet Take 1 tablet (100 mg total) by mouth daily. TAKE WITH HEAVY FOOD 30 tablet 4   Ginger, Zingiber officinalis, (GINGER PO) Take 1 capsule by mouth daily.     Ibuprofen (ADVIL LIQUI-GELS MINIS) 200 MG CAPS Take 800 mg by mouth every 6 (six) hours as needed (Pain).     latanoprost  (XALATAN ) 0.005 % ophthalmic solution Place 1 drop into both eyes at bedtime.     Multiple Vitamin (MULTIVITAMIN WITH MINERALS) TABS tablet Take 1 tablet by mouth daily.     Omega-3 Fatty Acids (FISH OIL) 1000 MG CAPS Take 1,000 mg by mouth daily.     pimecrolimus  (ELIDEL ) 1 % cream Apply topically 2 (two) times daily. (Patient taking differently: Apply 1 Application topically 2 (two) times daily as needed (Dermatitis).) 30 g 0   Semaglutide -Weight Management (WEGOVY ) 0.5 MG/0.5ML SOAJ Inject 0.5 mg into the skin once a week. 2  mL 3   tadalafil  (CIALIS ) 20 MG tablet TAKE 1 TABLET BY MOUTH DAILY AS NEEDED FOR ERECTILE DYSFUNCTION (Patient taking differently: Take 10 mg by mouth daily as needed for erectile dysfunction.) 12 tablet 5   triamterene -hydrochlorothiazide  (DYAZIDE ) 37.5-25 MG capsule Take 1 each (1 capsule total) by mouth daily. 90 capsule 3   TURMERIC PO Take 1 capsule by mouth daily.     zolpidem  (AMBIEN ) 10 MG tablet TAKE 1 TABLET BY MOUTH EVERYDAY AT BEDTIME 90 tablet 1   Adapalene -Benzoyl Peroxide  (EPIDUO ) 0.1-2.5 % gel Apply 1 application  topically at bedtime. Apply to affected area 3 nights per week (Monday, Wednesday, Friday) (Patient not taking: Reported on 12/15/2023) 45 g 3    Home: Home Living Family/patient expects to be discharged to:: Private residence Living Arrangements: Spouse/significant other Available Help at Discharge: Family, Available 24 hours/day Type of Home: House Home Access: Level entry Home Layout: Two level, Able to live on main level with bedroom/bathroom Alternate Level Stairs-Number of Steps: 14 Alternate Level  Stairs-Rails: Left Bathroom Shower/Tub: Health Visitor: Handicapped height Bathroom Accessibility: Yes Home Equipment: Shower seat, Hand held shower head Additional Comments: goes by DJ, 5 grandbabies   Functional History: Prior Function Prior Level of Function : Independent/Modified Independent, Working/employed, Driving Mobility Comments: independent, active ADLs Comments: independent, working in [employer] Status:  Mobility: Bed Mobility Overal bed mobility: Needs Assistance Bed Mobility: Supine to Sit Rolling: Mod assist Sidelying to sit: Min assist Supine to sit: Min assist General bed mobility comments: increased time and assistance to scoot to EOB Transfers Overall transfer level: Needs assistance Equipment used: 1 person hand held assist Transfers: Sit to/from Stand Sit to Stand: Min assist, Contact guard assist Bed to/from chair/wheelchair/BSC transfer type:: Stand pivot Stand pivot transfers: Mod assist, +2 physical assistance Transfer via Lift Equipment: Stedy General transfer comment: min to CGA for sit to stands and balance for transfers, cues for hand placement Ambulation/Gait Ambulation/Gait assistance: Min assist Gait Distance (Feet): 120 Feet Assistive device: 1 person hand held assist Gait Pattern/deviations: Step-through pattern General Gait Details: initial incoordination on L LE improved with cuing to weak heel toe pattern, some drift and instability with scanning.  L calf pain much improved and less disruptive to gait. Gait velocity: decr Gait velocity interpretation: <1.8 ft/sec, indicate of risk for recurrent falls Pre-gait activities: worked on w/shifts, unweighting for sidestepping, turning 360 deg min mod assist.  Notable more complex tasks throwing off coordination L side.    ADL: ADL Overall ADL's : Needs assistance/impaired Eating/Feeding: Set up Grooming: Contact guard assist, Standing Upper Body Bathing:  Maximal assistance, Sitting Lower Body Bathing: Maximal assistance, Sit to/from stand Upper Body Dressing : Minimal assistance, Standing Upper Body Dressing Details (indicate cue type and reason): gown for back Lower Body Dressing: Moderate assistance, Sitting/lateral leans Lower Body Dressing Details (indicate cue type and reason): assistance for donning shoes seated on EOB Toilet Transfer: Minimal assistance, Contact guard assist, BSC/3in1 General ADL Comments: performed toilet transfers and self care in bathroom with min to CGA  Cognition: Cognition Overall Cognitive Status: Within Functional Limits for tasks assessed Orientation Level: Oriented X4 Cognition Arousal: Alert Behavior During Therapy: WFL for tasks assessed/performed Overall Cognitive Status: Within Functional Limits for tasks assessed Area of Impairment: Attention, Following commands, Safety/judgement, Problem solving, Awareness Current Attention Level: Selective Memory: Decreased short-term memory, Decreased recall of precautions Following Commands: Follows one step commands consistently Safety/Judgement: Decreased awareness of safety Awareness: Intellectual Problem Solving: Slow processing,  Difficulty sequencing, Requires verbal cues, Requires tactile cues, Decreased initiation General Comments: occasional cue to attend to left, able to follow directions   Blood pressure 114/77, pulse 70, temperature 97.6 F (36.4 C), temperature source Oral, resp. rate 15, height 6' 2 (1.88 m), weight 121 kg, SpO2 95%. Physical Exam Vitals and nursing note reviewed. Exam conducted with a chaperone present.  Constitutional:      Comments: 2 family members at bedside; eating [employer]; sitting up in bed; c/o being cold, then hot; had electric blanket on bed!; appropriate, alert, awake, NAD  HENT:     Head: Normocephalic.     Comments: Crani midline and to R side- staples intact- looks good- no drainage or erythema    Right Ear:  External ear normal.     Left Ear: External ear normal.     Nose: Nose normal.  Eyes:     Extraocular Movements: Extraocular movements intact.     Pupils: Pupils are equal, round, and reactive to light.     Comments: No nystagmus  Cardiovascular:     Rate and Rhythm: Normal rate and regular rhythm.     Heart sounds: Normal heart sounds. No murmur heard.    No gallop.  Pulmonary:     Effort: Pulmonary effort is normal. No respiratory distress.     Breath sounds: Normal breath sounds. No wheezing, rhonchi or rales.  Abdominal:     General: Bowel sounds are normal. There is no distension.     Palpations: Abdomen is soft.     Tenderness: There is no abdominal tenderness.  Musculoskeletal:     Cervical back: Normal range of motion and neck supple.     Left lower leg: Left lower leg edema: min edema LLE.     Comments: RUE/RLE 5/5 LUE 5-/5 except grip 4+/5 LLE- 5-5/ throughout  Skin:    General: Skin is warm and dry.     Comments: Right crani incision CDI IV out- a few spots from blood draws, otherwise, skin looks good  Neurological:     Mental Status: He is alert and oriented to person, place, and time.     Comments: Mild left facial weakness with mild left inattention.Speech clear. Alert and appropriate.  Intact to light touch in all 4 extremities   Psychiatric:        Mood and Affect: Mood normal.        Behavior: Behavior normal.     Results for orders placed or performed during the hospital encounter of 12/15/23 (from the past 48 hours)  Glucose, capillary     Status: Abnormal   Collection Time: 12/24/23  4:44 PM  Result Value Ref Range   Glucose-Capillary 238 (H) 70 - 99 mg/dL    Comment: Glucose reference range applies only to samples taken after fasting for at least 8 hours.  Glucose, capillary     Status: Abnormal   Collection Time: 12/24/23  9:06 PM  Result Value Ref Range   Glucose-Capillary 143 (H) 70 - 99 mg/dL    Comment: Glucose reference range applies only to  samples taken after fasting for at least 8 hours.  Heparin  level (unfractionated)     Status: None   Collection Time: 12/24/23  9:33 PM  Result Value Ref Range   Heparin  Unfractionated 0.42 0.30 - 0.70 IU/mL    Comment: (NOTE) The clinical reportable range upper limit is being lowered to >1.10 to align with the FDA approved guidance for the current laboratory assay.  If heparin  results are below expected values, and patient dosage has  been confirmed, suggest follow up testing of antithrombin III levels. Performed at [facility], [address] [place], [place] [address]   Heparin  level (unfractionated)     Status: Abnormal   Collection Time: 12/25/23  4:45 AM  Result Value Ref Range   Heparin  Unfractionated 0.20 (L) 0.30 - 0.70 IU/mL    Comment: (NOTE) The clinical reportable range upper limit is being lowered to >1.10 to align with the FDA approved guidance for the current laboratory assay.  If heparin  results are below expected values, and patient dosage has  been confirmed, suggest follow up testing of antithrombin III levels. Performed at [facility], [address] [place], [place] [address]   CBC     Status: Abnormal   Collection Time: 12/25/23  4:45 AM  Result Value Ref Range   WBC 12.1 (H) 4.0 - 10.5 K/uL   RBC 6.45 (H) 4.22 - 5.81 MIL/uL   Hemoglobin 17.5 (H) 13.0 - 17.0 g/dL   HCT 46.8 (H) 60.9 - 47.9 %   MCV 82.3 80.0 - 100.0 fL   MCH 27.1 26.0 - 34.0 pg   MCHC 33.0 30.0 - 36.0 g/dL   RDW 81.9 (H) 88.4 - 84.4 %   Platelets 216 150 - 400 K/uL   nRBC 0.0 0.0 - 0.2 %    Comment: Performed at [facility], [address], [place], [place] [address]  Glucose, capillary     Status: Abnormal   Collection Time: 12/25/23  7:50 AM  Result Value Ref Range   Glucose-Capillary 132 (H) 70 - 99 mg/dL    Comment: Glucose reference range applies only to samples taken after fasting for at least 8 hours.  Glucose, capillary     Status: Abnormal    Collection Time: 12/25/23 11:09 AM  Result Value Ref Range   Glucose-Capillary 105 (H) 70 - 99 mg/dL    Comment: Glucose reference range applies only to samples taken after fasting for at least 8 hours.  Heparin  level (unfractionated)     Status: None   Collection Time: 12/25/23  1:42 PM  Result Value Ref Range   Heparin  Unfractionated 0.37 0.30 - 0.70 IU/mL    Comment: (NOTE) The clinical reportable range upper limit is being lowered to >1.10 to align with the FDA approved guidance for the current laboratory assay.  If heparin  results are below expected values, and patient dosage has  been confirmed, suggest follow up testing of antithrombin III levels. Performed at [facility], [address] [place], [place] [address]   Glucose, capillary     Status: Abnormal   Collection Time: 12/25/23  4:29 PM  Result Value Ref Range   Glucose-Capillary 147 (H) 70 - 99 mg/dL    Comment: Glucose reference range applies only to samples taken after fasting for at least 8 hours.  Glucose, capillary     Status: Abnormal   Collection Time: 12/25/23  9:20 PM  Result Value Ref Range   Glucose-Capillary 198 (H) 70 - 99 mg/dL    Comment: Glucose reference range applies only to samples taken after fasting for at least 8 hours.  CBC     Status: Abnormal   Collection Time: 12/26/23  6:41 AM  Result Value Ref Range   WBC 11.4 (H) 4.0 - 10.5 K/uL   RBC 6.49 (H) 4.22 - 5.81 MIL/uL   Hemoglobin 17.5 (H) 13.0 -  17.0 g/dL   HCT 46.6 (H) 60.9 - 47.9 %   MCV 82.1 80.0 - 100.0 fL   MCH 27.0 26.0 - 34.0 pg   MCHC 32.8 30.0 - 36.0 g/dL   RDW 82.1 (H) 88.4 - 84.4 %   Platelets 214 150 - 400 K/uL   nRBC 0.0 0.0 - 0.2 %    Comment: Performed at [facility], [address], [place], [place] [address]  Glucose, capillary     Status: Abnormal   Collection Time: 12/26/23  7:57 AM  Result Value Ref Range   Glucose-Capillary 108 (H) 70 - 99 mg/dL    Comment: Glucose reference range applies only to  samples taken after fasting for at least 8 hours.  Glucose, capillary     Status: Abnormal   Collection Time: 12/26/23 11:35 AM  Result Value Ref Range   Glucose-Capillary 130 (H) 70 - 99 mg/dL    Comment: Glucose reference range applies only to samples taken after fasting for at least 8 hours.   CT HEAD WO CONTRAST ( ) Result Date: 12/26/2023 CLINICAL DATA:  Brain/CNS neoplasm, monitor. EXAM: CT HEAD WITHOUT CONTRAST TECHNIQUE: Contiguous axial images were obtained from the base of the skull through the vertex without intravenous contrast. RADIATION DOSE REDUCTION: This exam was performed according to the departmental dose-optimization program which includes automated exposure control, adjustment of the mA and/or kV according to patient size and/or use of iterative reconstruction technique. COMPARISON:  Head CT 12/15/2023.  MRI brain 12/19/2023. FINDINGS: Brain: Postoperative changes of right frontal craniotomy for resection of a mass centered within the right superior frontal gyrus. Expected small amount of blood products in the resection cavity. Similar extent of surrounding vasogenic edema with partial effacement of the right lateral ventricle. No hydrocephalus, extra-axial collection or midline shift. Vascular: No hyperdense vessel or unexpected calcification. Skull: Right frontal craniotomy. Sinuses/Orbits: No acute finding. Other: None. IMPRESSION: Postoperative changes of right frontal craniotomy for resection of a mass centered within the right superior frontal gyrus. Expected small amount of blood products in the resection cavity. Similar extent of surrounding vasogenic edema with partial effacement of the right lateral ventricle. Electronically Signed   By: Ryan Chess M.D.   On: 12/26/2023 09:13      Blood pressure 114/77, pulse 70, temperature 97.6 F (36.4 C), temperature source Oral, resp. rate 15, height 6' 2 (1.88 m), weight 121 kg, SpO2 95%.  Medical Problem List and  Plan: 1. Functional deficits secondary to right frontal glioblastoma s/p crani and resection  -patient may shower  -ELOS/Goals: 10-14 days, supervision goals with PT, OT, SLP 2.  Left peroneal DVT/Antithrombotics: -DVT/anticoagulation: on IV heparin  and to transition to DOAC today.   -antiplatelet therapy: N/A 3. Pain Management: Continue hydrocodone  prn.  4. Mood/Behavior/Sleep:LCSW to follow for evaluation and support.  --continue Ambien  for sleep. .   -antipsychotic agents: N/A  5. Neuropsych/cognition: This patient is capable of making decisions on his own behalf. 6. Skin/Wound Care: Monitor incision for healing. Routine pressure relief measures.  7. Fluids/Electrolytes/Nutrition: Monitor I/O. Check CMET in am 8. Seizure prophylaxis: On Keppra  500 mg BID. Decadron  weaned off 12/31 am. 9. Steroid induced hyperglycemia: Hgb A1C-5.4. has been on novolog  5 units TID-->will d/c and use SSI for elevated BS 10.  HTN: Monitor BP TID--SBP goal < 150.  --Was on Dyazide  PTA-->resume?  11. OSA: Continue CPAP--has been compliant at home also. 12. Constipation Continue Senna. Miralax  was added on 12/31 as last BM 12/26 13. Leucocytosis:  Likely reactive due to steroids. Monitor for fevers and other signs of infection.  14. Polycythemia: Hgb 17.3 15. H/o Iron deficiency/ S/p gastric sleeve: Resume multivitamin.        Sharlet GORMAN Schmitz, PA-C 12/26/2023 I have personally performed a face to face diagnostic evaluation of this patient and formulated the key components of the plan.  Additionally, I have personally reviewed laboratory data, imaging studies, as well as relevant notes and concur with the physician assistant's documentation above.   The patient's status has not changed from the original H&P.  Any changes in documentation from the acute care chart have been noted above.

## 2023-12-22 NOTE — Progress Notes (Signed)
Physical Therapy Treatment Patient Details Name: Nicholas Stevens MRN: [id number] DOB: 12/22/1971 Today's Date: 12/22/2023   History of Present Illness 52 yo male presenting 12/20 with decreased response time while driving, incoordination, imbalance. Imaging showed necrotic and hemorrhagic mass in the superior right frontal lobe with features of glioblastoma with mass effect. S/p R craniotomy 12/23. PMH includes: obesity and OSA.    PT Comments  Pt progressing well toward goals.  Emphasis on transition to EOB with normalized movement, donning socks with minimal assist, pregait activity at EOB with min/mod assist, and gait progression in the hall with mod assist and L UE over therapist's shoulder.    If plan is discharge home, recommend the following: Assistance with cooking/housework;Direct supervision/assist for medications management;Direct supervision/assist for financial management;Assist for transportation;Help with stairs or ramp for entrance;Supervision due to cognitive status;A lot of help with walking and/or transfers;A lot of help with bathing/dressing/bathroom   Can travel by private vehicle        Equipment Recommendations  Other (comment)    Recommendations for Other Services Rehab consult     Precautions / Restrictions Precautions Precautions: Fall Precaution Comments: s/p crani Restrictions Weight Bearing Restrictions Per Provider Order: No     Mobility  Bed Mobility Overal bed mobility: Needs Assistance Bed Mobility: Supine to Sit     Supine to sit: Min assist     General bed mobility comments: pt sat up via R elbow, balanced against raised HOB while donning R then L sock.  Needed assist of expanding sock over all toes L foot.  Pt then moved and assisted his L LE over the edge and used bil UE's to square up on EOB.  Min assist overall whether stability, L LE assist or stabilizing/assist with donning L sock.    Transfers Overall transfer level: Needs  assistance Equipment used: 1 person hand held assist Transfers: Sit to/from Stand Sit to Stand: Min assist (heavy min)           General transfer comment: cues for L UE assist, also over min stability assist after up in standing.    Ambulation/Gait Ambulation/Gait assistance: Mod assist Gait Distance (Feet): 140 Feet Assistive device: 1 person hand held assist (L UE over therapists shoulder) Gait Pattern/deviations: Step-through pattern, Decreased step length - right, Decreased step length - left, Decreased stride length Gait velocity: decr Gait velocity interpretation: <1.8 ft/sec, indicate of risk for recurrent falls Pre-gait activities: At EOB, worked on standing with decreasing assist, w/shifts, full weighting/unweighting.  side-stepping, turning 360 each direction.  All with cues for w/shifts and lining up L LE/foot appropriately General Gait Details: No AD used, L UE over therapist's shd.  generally light mod assist for stability, cuing/assist fro w/shifts--unweighting appropriately, cues for heel toe pattern.  cues for side stepping and stepping backward.   Stairs             Wheelchair Mobility     Tilt Bed    Modified Rankin (Stroke Patients Only)       Balance Overall balance assessment: Needs assistance   Sitting balance-Leahy Scale: Fair       Standing balance-Leahy Scale: Poor Standing balance comment: still reliant on external support, but much improved                            Cognition Arousal: Alert Behavior During Therapy: WFL for tasks assessed/performed Overall Cognitive Status:  (NT formally, inattension to the L much improved)  Exercises      General Comments        Pertinent Vitals/Pain Pain Assessment Pain Assessment: Faces Faces Pain Scale: Hurts little more Pain Location: L calf Pain Descriptors / Indicators: Sore Pain Intervention(s): Monitored  during session, Limited activity within patient's tolerance    Home Living                          Prior Function            PT Goals (current goals can now be found in the care plan section) Acute Rehab PT Goals PT Goal Formulation: With patient/family Time For Goal Achievement: 01/02/24 Potential to Achieve Goals: Good Progress towards PT goals: Progressing toward goals    Frequency    Min 1X/week      PT Plan      Co-evaluation              AM-PAC PT "6 Clicks" Mobility   Outcome Measure  Help needed turning from your back to your side while in a flat bed without using bedrails?: A Little Help needed moving from lying on your back to sitting on the side of a flat bed without using bedrails?: A Little Help needed moving to and from a bed to a chair (including a wheelchair)?: A Lot Help needed standing up from a chair using your arms (e.g., wheelchair or bedside chair)?: A Little Help needed to walk in hospital room?: A Lot Help needed climbing 3-5 steps with a railing? : A Lot 6 Click Score: 15    End of Session Equipment Utilized During Treatment: Gait belt   Patient left: in bed;with call bell/phone within reach;with family/visitor present;Other (comment) (EOB wo wash up) Nurse Communication: Mobility status PT Visit Diagnosis: Unsteadiness on feet (R26.81);Other abnormalities of gait and mobility (R26.89) Hemiplegia - Right/Left: Left Hemiplegia - dominant/non-dominant: Non-dominant Hemiplegia - caused by: Unspecified Pain - part of body:  (calf L)     Time: 2956-2130 PT Time Calculation (min) (ACUTE ONLY): 27 min  Charges:    $Gait Training: 8-22 mins $Therapeutic Activity: 8-22 mins PT General Charges $$ ACUTE PT VISIT: 1 Visit                     12/22/2023  Jacinto Halim., PT Acute Rehabilitation Services [phone]  (office)   Nicholas Stevens 12/22/2023, 5:09 PM

## 2023-12-22 NOTE — Progress Notes (Signed)
SLP Cancellation Note  Patient Details Name: Nicholas Stevens MRN: [id number] DOB: 05/10/1971   Cancelled treatment:        Attempted to see pt for cognitive linguistic assessment.  Pt with 4 visitors in room and appeared to be on video call on phone.  Pt states that he is not having any language difficulties and he feels that his cognition is "clear."  SLP will reattempt as schedule permits.    Kerrie Pleasure, MA, CCC-SLP Acute Rehabilitation Services Office: [phone] 12/22/2023, 10:04 AM

## 2023-12-22 NOTE — Progress Notes (Signed)
  Progress Note   Patient: Nicholas Stevens ZOX:[id number] DOB: Jul 08, 1971 DOA: 12/15/2023     7 DOS: the patient was seen and examined on 12/22/2023   Brief hospital course: 52 year old male with known OSA admitted with right frontal mass with hemorrhage.  Wife at bedside and assisting with history.  Patient noticed a decrease in response time while driving yesterday and new onset episodes of decrease use of left side.  This worsened this morning, with loss of coordination, including where he lost his balance and fell against the wall, denies hitting head or body, prompting to seek further medical treatment. CODE stroke imaging was obtained due to symptoms and revealed a partially  hemorrhagic lesion in the superior right frontal lobe with local mass effect.  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   Assessment and Plan: R frontal lobe hemorrhagic mass w/ brain compression  - s/p tumor resection  - Keppra 500 mg PO bid  - PO dexamethasone tapering protocol is in place  - Protonix 40 mg PO daily  - Norco 1 tab PO q4 hr PRN  - Dilaudid PRN  - IPR authorization is pending   OSA  - CPAP @ night time   Steroid induced hyperglycemia  - Novolog SS tid w/ meals  - Novolog 5 units tid w/ meals        Subjective: Pt seen and examined at the bedside. He remains in good spirits. He is on tapering dose of dexamethasone. Insurance authorization for IPR remains pending as of 10:13 AM 12/22/2023.  Physical Exam: Vitals:   12/22/23 0305 12/22/23 0351 12/22/23 0754 12/22/23 0806  BP: 131/84  (!) 156/107 (!) 128/91  Pulse: 72  70 68  Resp: 13  17 16   Temp: 98 F (36.7 C)  97.8 F (36.6 C)   TempSrc: Axillary  Oral   SpO2: 95%  94% 95%  Weight:  123.9 kg    Height:       Physical Exam Constitutional:      Appearance: Normal appearance.   HENT:     Head: Normocephalic.     Mouth/Throat:     Mouth: Mucous membranes are moist.  Cardiovascular:     Rate and Rhythm: Normal rate and regular rhythm.  Pulmonary:     Effort: Pulmonary effort is normal.  Abdominal:     Palpations: Abdomen is soft.  Musculoskeletal:        General: Normal range of motion.     Cervical back: Neck supple.  Skin:    General: Skin is warm.  Neurological:     Mental Status: He is alert. Mental status is at baseline.  Psychiatric:        Mood and Affect: Mood normal.      Disposition: Status is: Inpatient Remains inpatient appropriate because: Awaiting IPR authorization   Planned Discharge Destination: Rehab    Time spent: 35 minutes  Author: Baron Hamper , MD 12/22/2023 10:43 AM  For on call review [email or web address].

## 2023-12-22 NOTE — Progress Notes (Signed)
Inpatient Rehab Admissions Coordinator:  Received insurance authorization. Pt will be a planned admission for Saturday, 12/23/23. Dr. Nelda Severe aware and in agreement. Dr. Riley Kill will assess in the morning for final approval. Will continue to follow.   Wolfgang Phoenix, MS, CCC-SLP Admissions Coordinator [phone]

## 2023-12-23 ENCOUNTER — Inpatient Hospital Stay (HOSPITAL_COMMUNITY): Payer: Commercial Managed Care - PPO

## 2023-12-23 DIAGNOSIS — M79605 Pain in left leg: Secondary | ICD-10-CM

## 2023-12-23 DIAGNOSIS — G9389 Other specified disorders of brain: Secondary | ICD-10-CM | POA: Diagnosis not present

## 2023-12-23 LAB — GLUCOSE, CAPILLARY
Glucose-Capillary: 112 mg/dL — ABNORMAL HIGH (ref 70–99)
Glucose-Capillary: 147 mg/dL — ABNORMAL HIGH (ref 70–99)
Glucose-Capillary: 179 mg/dL — ABNORMAL HIGH (ref 70–99)
Glucose-Capillary: 122 mg/dL — ABNORMAL HIGH (ref 70–99)

## 2023-12-23 MED ORDER — HEPARIN (PORCINE) 25000 UT/250ML-% IV SOLN
1150.0000 [IU]/h | INTRAVENOUS | Status: DC
  Administered 2023-12-23: 1500 [IU]/h via INTRAVENOUS
  Administered 2023-12-24: 1050 [IU]/h via INTRAVENOUS
  Administered 2023-12-25: 1150 [IU]/h via INTRAVENOUS
  Filled 2023-12-23 (×3): qty 250

## 2023-12-23 MED ORDER — LEVETIRACETAM 500 MG PO TABS: 500.0000 mg | ORAL_TABLET | Freq: Two times a day (BID) | ORAL | 0 refills | Status: DC

## 2023-12-23 MED ORDER — DEXAMETHASONE 2 MG PO TABS: ORAL_TABLET | ORAL | 0 refills | Status: DC

## 2023-12-23 MED ORDER — LATANOPROST 0.005 % OP SOLN
1.0000 [drp] | Freq: Every day | OPHTHALMIC | Status: DC
Start: 2023-12-23 — End: 2023-12-26
  Administered 2023-12-23 – 2023-12-25 (×3): 1 [drp] via OPHTHALMIC
  Filled 2023-12-23: qty 2.5

## 2023-12-23 MED ORDER — POLYVINYL ALCOHOL 1.4 % OP SOLN
1.0000 [drp] | OPHTHALMIC | Status: DC | PRN
  Administered 2023-12-23: 1 [drp] via OPHTHALMIC
  Filled 2023-12-23: qty 15

## 2023-12-23 NOTE — Progress Notes (Signed)
Nicholas Stevens  [name]:[id number] DOB: August 20, 1971 DOA: 12/15/2023 PCP: Tresa Garter, MD   Brief Narrative:  52 year old male with known OSA admitted with right frontal mass with hemorrhage. Wife at bedside and assisting with history. Patient noticed a decrease in response time while driving yesterday and new onset episodes of decrease use of left side. This worsened this morning, with loss of coordination, including where he lost his balance and fell against the wall, denies hitting head or body, prompting to seek further medical treatment. CODE stroke imaging was obtained due to symptoms and revealed a partially hemorrhagic lesion in the superior right frontal lobe with local mass effect. 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 Status post craniotomy/tumor resection 12/18/23 without complication. Post operative care ongoing, therapy recommending inpatient rehab which has been complicated by acute LLE pain concerning for possible DVT - still require vascular US to confirm/rule out DVT as patient may require additional imaging/procedure if DVT confirmed.  Assessment & Plan:   Principal Problem:   Brain mass  R frontal lobe hemorrhagic mass w/ brain compression  - s/p tumor resection 12/18/23 - Keppra 500 mg PO bid  - Dexamethasone taper ongoing - stop date 12/27/23 - Protonix 40 mg PO daily  - Norco 1 tab PO q4 hr PRN  - IPR authorization is approved but discharge is delayed based on ?DVT diagnosis as below - if negative imaging today can likely DC to inpt rehab this afternoon.    RLE  pain/swelling - rule out acute DVT - Vascular US pending - If patient requires anticoagulation/IVC will need to discuss options with neurosurgery  OSA  - CPAP @ night time    Steroid induced hyperglycemia  - Novolog SS tid w/ meals   - Novolog 5 units tid w/ meals    DVT prophylaxis: heparin injection 5,000 Units Start: 12/20/23 0600 SCDs Start: 12/18/23 1751 Place and maintain sequential compression device Start: 12/17/23 1237 SCD's Start: 12/15/23 1829 SCDs Start: 12/15/23 1056   Code Status:   Code Status: Full Code  Family Communication: At bedside  Status is: Inpt  Dispo: The patient is from: Home              Anticipated d/c is to: CIR              Anticipated d/c date is: Imminent              Patient currently is medically stable for discharge  Consultants:  NeuroSurgery, PCCM  Procedures:  Craniotomy and tumor resection 12/18/23  Antimicrobials:  None   Subjective: No acute issues/events overnight - awaiting LLE ultrasound   Objective: Vitals:   12/22/23 2043 12/22/23 2309 12/23/23 0340 12/23/23 0500  BP: (!) 133/90 (!) 150/90 (!) 139/90   Pulse: 73 68 62   Resp: 20 13 15    Temp: 97.8 F (36.6 C) 98.1 F (36.7 C) 98.4 F (36.9 C)   TempSrc: Oral Oral Oral   SpO2: 90% 96% 93%   Weight:    120.6 kg  Height:       No intake or output data in the 24 hours ending 12/23/23 0703 Filed Weights   12/18/23 1034 12/22/23 0351 12/23/23 0500  Weight: 115.7 kg 123.9 kg 120.6 kg    Examination:  General exam: Appears calm and comfortable  Respiratory  system: Clear to auscultation. Respiratory effort normal. Cardiovascular system: S1 & S2 heard, RRR. No JVD, murmurs, rubs, gallops or clicks. No pedal edema. Gastrointestinal system: Abdomen is nondistended, soft and nontender. No organomegaly or masses felt. Normal bowel sounds heard. Central nervous system: Alert and oriented. No focal neurological deficits. Extremities: Symmetric 5 x 5 power. Skin: No rashes, lesions or ulcers Psychiatry: Judgement and insight appear normal. Mood & affect appropriate.     Data Reviewed: I have personally reviewed following labs and imaging studies  CBC: Recent Labs  Lab 12/18/23 0433  12/19/23 1323  WBC 8.7 10.0  NEUTROABS  --  8.3*  HGB 17.9* 17.7*  HCT 55.3* 54.5*  MCV 83.4 83.3  PLT 304 280   Basic Metabolic Panel: Recent Labs  Lab 12/18/23 0433 12/19/23 1323 12/20/23 2327  NA 136 138 139  K 4.2 3.6 4.5  CL 105 106 107  CO2 22 21* 22  GLUCOSE 123* 130* 105*  BUN 16 18 18   CREATININE 0.93 0.94 0.86  CALCIUM 8.8* 8.4* 8.6*  MG  --   --  2.4   GFR: Estimated Creatinine Clearance: 135.6 mL/min (by C-G formula based on SCr of 0.86 mg/dL). Liver Function Tests: No results for input(s): "AST", "ALT", "ALKPHOS", "BILITOT", "PROT", "ALBUMIN" in the last 168 hours. No results for input(s): "LIPASE", "AMYLASE" in the last 168 hours. No results for input(s): "AMMONIA" in the last 168 hours. Coagulation Profile: No results for input(s): "INR", "PROTIME" in the last 168 hours. Cardiac Enzymes: No results for input(s): "CKTOTAL", "CKMB", "CKMBINDEX", "TROPONINI" in the last 168 hours. BNP (last 3 results) No results for input(s): "PROBNP" in the last 8760 hours. HbA1C: No results for input(s): "HGBA1C" in the last 72 hours. CBG: Recent Labs  Lab 12/22/23 0753 12/22/23 0908 12/22/23 1120 12/22/23 1738 12/22/23 2111  GLUCAP 146* 137* 181* 187* 179*   Lipid Profile: No results for input(s): "CHOL", "HDL", "LDLCALC", "TRIG", "CHOLHDL", "LDLDIRECT" in the last 72 hours. Thyroid Function Tests: No results for input(s): "TSH", "T4TOTAL", "FREET4", "T3FREE", "THYROIDAB" in the last 72 hours. Anemia Panel: No results for input(s): "VITAMINB12", "FOLATE", "FERRITIN", "TIBC", "IRON", "RETICCTPCT" in the last 72 hours. Sepsis Labs: No results for input(s): "PROCALCITON", "LATICACIDVEN" in the last 168 hours.  Recent Results (from the past 240 hours)  MRSA Next Gen by PCR, Nasal     Status: None   Collection Time: 12/15/23  8:28 PM   Specimen: Nasal Mucosa; Nasal Swab  Result Value Ref Range Status   MRSA by PCR Next Gen NOT DETECTED NOT DETECTED Final     Comment: (NOTE) The GeneXpert MRSA Assay (FDA approved for NASAL specimens only), is one component of a comprehensive MRSA colonization surveillance program. It is not intended to diagnose MRSA infection nor to guide or monitor treatment for MRSA infections. Test performance is not FDA approved in patients less than 49 years old. Performed at [facility], [address] [place], [place] [address]   Surgical PCR screen     Status: None   Collection Time: 12/17/23  9:29 PM   Specimen: Nasal Mucosa; Nasal Swab  Result Value Ref Range Status   MRSA, PCR NEGATIVE NEGATIVE Final   Staphylococcus aureus NEGATIVE NEGATIVE Final    Comment: (NOTE) The Xpert SA Assay (FDA approved for NASAL specimens in patients 67 years of age and older), is one component of a comprehensive surveillance program. It is not intended to diagnose infection nor to guide or monitor treatment. Performed at  [facility], [address] [place], [place], [place] [address]          Radiology Studies: No results found.      Scheduled Meds:  bisacodyl  10 mg Rectal Once   dexamethasone  4 mg Oral Q12H   Followed by   Melene Muller ON 12/24/2023] dexamethasone  2 mg Oral Q12H   Followed by   Melene Muller ON 12/27/2023] dexamethasone  2 mg Oral Daily   heparin injection (subcutaneous)  5,000 Units Subcutaneous Q8H   insulin aspart  0-9 Units Subcutaneous TID WC   insulin aspart  5 Units Subcutaneous TID WC   levETIRAcetam  500 mg Oral BID   pantoprazole  40 mg Oral Daily   polyethylene glycol  17 g Oral BID   senna-docusate  1 tablet Oral BID   Continuous Infusions:   LOS: 8 days    Time spent:  Azucena Fallen, DO [facility]  If 7PM-7AM, please contact night-coverage [email or web address]  12/23/2023, 7:03 AM

## 2023-12-23 NOTE — Progress Notes (Signed)
Left lower extremity venous duplex has been completed.  Results can be found in chart review under CV Proc.  12/23/2023 2:53 PM  Fernande Bras, RVT.

## 2023-12-23 NOTE — Progress Notes (Signed)
PHARMACY - ANTICOAGULATION CONSULT NOTE  Pharmacy Consult for heparin Indication: DVT  No Known Allergies  Patient Measurements: Height: 6\' 2"  (188 cm) Weight: 120.6 kg (265 lb 14 oz) IBW/kg (Calculated) : 82.2 Heparin Dosing Weight: 108 kg  Vital Signs: Temp: 98.4 F (36.9 C) (12/28 2102) Temp Source: Oral (12/28 2102) BP: 136/92 (12/28 2102) Pulse Rate: 83 (12/28 2102)  Labs: Recent Labs    12/20/23 2327  CREATININE 0.86    Estimated Creatinine Clearance: 135.6 mL/min (by C-G formula based on SCr of 0.86 mg/dL).   Medical History: Past Medical History:  Diagnosis Date   DDD (degenerative disc disease), lumbar    HNP (herniated nucleus pulposus)    Hypertension    Liver hemangioma    Morbid obesity (HCC)    OSA (obstructive sleep apnea)    no cpap used since weight loss   Overweight(278.02)    Plantar fasciitis of right foot    Sleep apnea    Tinea barbae       Assessment: 52 yo m s/p craniotomy for tumor resection 12/18/23 found to have acute LLE DVT. No anticoagulation prior to admission. Pharmacy consulted for heparin with no bolus.  Received 5000 units subcutaneous heparin at 14:45.   Goal of Therapy:  Heparin level 0.3-0.5 units/ml Monitor platelets by anticoagulation protocol: Yes   Plan:  Heparin 1500 units/hr, no bolus Monitor daily heparin level, CBC, signs/symptoms of bleeding    Alphia Moh, PharmD, BCPS, BCCP Clinical Pharmacist  Please check AMION for all [facility] Pharmacy phone numbers After 10:00 PM, call Main Pharmacy [phone]

## 2023-12-23 NOTE — Progress Notes (Signed)
I saw patient this morning. His LLE on exam and by history is highly suspicious for a DVT. Also, he's at high risk for a clot. It appears that dopplers were ordered yesterday, but no mention of leg or dopplers in chart. I'm hearing that dopplers may not be done today or tomorrow, however.   I reached out to NS this morning regarding feasibility of anticoagulation given surgery was on 12/23, and I'm awaiting a response.   I don't mind managing this man on rehab, but he can't sit there on bedrest for 2 days as we await the dopplers and potentially an IVCF.   Will hold on admission today pending dopplers. I spoke with Dr. Natale Milch who's aware of the above.    Ranelle Oyster, MD, [facility] Physical Medicine & Rehabilitation Medical Director Rehabilitation Services 12/23/2023

## 2023-12-23 NOTE — Progress Notes (Signed)
Inpatient Rehab Admissions Coordinator:  There is a bed available in CIR for pt today. Dr. Natale Milch aware and in agreement. Pt, pt's wife, NSG and TOC made aware.   Wolfgang Phoenix, MS, CCC-SLP Admissions Coordinator [phone]

## 2023-12-23 NOTE — Progress Notes (Signed)
Pt. BP (!) 160/107   Pulse 71   Temp 98.4 F (36.9 C) (Oral)   Resp 13   Ht 6\' 2"  (1.88 m)   Wt 120.6 kg   SpO2 94%   BMI 34.14 kg/m  Prn hydrazaline given.   BP 126/72 (BP Location: Left Arm)   Pulse 71   Temp 98.4 F (36.9 C) (Oral)   Resp 13   Ht 6\' 2"  (1.88 m)   Wt 120.6 kg   SpO2 94%   BMI 34.14 kg/m     Nicholas Stevens 12/23/23 5:16 PM

## 2023-12-23 NOTE — Progress Notes (Signed)
Ok for heparin to treat DVT, just no loading dose. Paged hospitalist to inform him ok to treat.

## 2023-12-23 NOTE — Progress Notes (Signed)
Pt screened for DVT, ALL Doctors made aware of situation. Awaiting orders for Heparin. Nicholas Stevens 12/23/23 7:53 PM

## 2023-12-23 NOTE — Progress Notes (Signed)
    Patient Name: Nicholas Stevens           DOB: [birth year]  MRN: [id number]      Admission Date: 12/15/2023  Attending Provider: Azucena Fallen, MD  Primary Diagnosis: Brain mass   Level of care: Progressive    CROSS COVER NOTE   Date of Service   12/23/2023   Nicholas Stevens, 52 y.o. male, was admitted on 12/15/2023 for Brain mass.    HPI/Events of Note   Notified of vascular ultrasound result- acute DVT involving the left peroneal veins. Per neurosurgery, okay to treat with heparin (no loading dose). Family questions regarding heparin therapy have been answered by myself and neurosurgery NP.    Interventions/ Plan   Heparin per pharmacy consult        Anthoney Harada, DNP, ACNPC- AG [facility] [place]

## 2023-12-23 NOTE — Progress Notes (Signed)
NEUROSURGERY PROGRESS NOTE  Doing well. S/p crani for resection of tumor. Dressing removed as it was itching him. Possible CIR admission today.   Temp:  [97.6 F (36.4 C)-98.4 F (36.9 C)] 97.9 F (36.6 C) (12/28 0758) Pulse Rate:  [62-82] 66 (12/28 0758) Resp:  [13-20] 17 (12/28 0758) BP: (133-153)/(90-107) 150/95 (12/28 0758) SpO2:  [90 %-96 %] 93 % (12/28 0758) FiO2 (%):  [21 %] 21 % (12/27 2232) Weight:  [120.6 kg] 120.6 kg (12/28 0500)   Sherryl Manges, NP 12/23/2023 10:31 AM

## 2023-12-24 DIAGNOSIS — G9389 Other specified disorders of brain: Secondary | ICD-10-CM | POA: Diagnosis not present

## 2023-12-24 LAB — CBC
HCT: 53.8 % — ABNORMAL HIGH (ref 39.0–52.0)
Hemoglobin: 17.9 g/dL — ABNORMAL HIGH (ref 13.0–17.0)
MCH: 27.2 pg (ref 26.0–34.0)
MCHC: 33.3 g/dL (ref 30.0–36.0)
MCV: 81.6 fL (ref 80.0–100.0)
Platelets: 229 10*3/uL (ref 150–400)
RBC: 6.59 MIL/uL — ABNORMAL HIGH (ref 4.22–5.81)
RDW: 17.6 % — ABNORMAL HIGH (ref 11.5–15.5)
WBC: 12.7 10*3/uL — ABNORMAL HIGH (ref 4.0–10.5)
nRBC: 0.2 % (ref 0.0–0.2)

## 2023-12-24 LAB — GLUCOSE, CAPILLARY
Glucose-Capillary: 123 mg/dL — ABNORMAL HIGH (ref 70–99)
Glucose-Capillary: 144 mg/dL — ABNORMAL HIGH (ref 70–99)
Glucose-Capillary: 178 mg/dL — ABNORMAL HIGH (ref 70–99)
Glucose-Capillary: 238 mg/dL — ABNORMAL HIGH (ref 70–99)
Glucose-Capillary: 143 mg/dL — ABNORMAL HIGH (ref 70–99)

## 2023-12-24 LAB — HEPARIN LEVEL (UNFRACTIONATED)
Heparin Unfractionated: 0.77 [IU]/mL — ABNORMAL HIGH (ref 0.30–0.70)
Heparin Unfractionated: 0.94 [IU]/mL — ABNORMAL HIGH (ref 0.30–0.70)
Heparin Unfractionated: 0.42 [IU]/mL (ref 0.30–0.70)

## 2023-12-24 NOTE — Progress Notes (Signed)
12/23/2023 2144 Nicholas Harada NP was informed that  pt and wife had spoke to neuro surgery NP on call and they would like to proceed with the heparin and an order was needed for the heparin [name]

## 2023-12-24 NOTE — Progress Notes (Signed)
PHARMACY - ANTICOAGULATION Pharmacy Consult for heparin Indication: DVT   No Known Allergies  Patient Measurements: Height: 6\' 2"  (188 cm) Weight: 121 kg (266 lb 12.1 oz) IBW/kg (Calculated) : 82.2 Heparin Dosing Weight: 108 kg  Vital Signs: Temp: 98.1 F (36.7 C) (12/29 1953) Temp Source: Oral (12/29 1953) BP: 150/97 (12/29 1953) Pulse Rate: 94 (12/29 1953)  Labs: Recent Labs    12/24/23 0357 12/24/23 1100 12/24/23 2133  HGB 17.9*  --   --   HCT 53.8*  --   --   PLT 229  --   --   HEPARINUNFRC 0.77* 0.94* 0.42    Estimated Creatinine Clearance: 135.7 mL/min (by C-G formula based on SCr of 0.86 mg/dL).   Medical History: Past Medical History:  Diagnosis Date   DDD (degenerative disc disease), lumbar    HNP (herniated nucleus pulposus)    Hypertension    Liver hemangioma    Morbid obesity (HCC)    OSA (obstructive sleep apnea)    no cpap used since weight loss   Overweight(278.02)    Plantar fasciitis of right foot    Sleep apnea    Tinea barbae       Assessment: 52 yo male with LLE DVT s/p craniotomy for tumor resection 12/18/23 for heparin   Heparin level 0.42 is therapeutic on 1050 units/hr.  No issues with infusion or bleeding per RN.  Goal of Therapy:  Heparin level 0.3-0.5 units/ml Monitor platelets by anticoagulation protocol: Yes   Plan:  Continue Heparin 1050 units/hr Monitor daily heparin level, CBC, signs/symptoms of bleeding   Alphia Moh, PharmD, BCPS, BCCP Clinical Pharmacist  Please check AMION for all [facility] Pharmacy phone numbers After 10:00 PM, call Main Pharmacy [phone]

## 2023-12-24 NOTE — Progress Notes (Signed)
12/23/2023 2030 Nicholas Harada NP called to speak with pt and wife.   Nicholas Stevens

## 2023-12-24 NOTE — Progress Notes (Signed)
12/23/2023 2125 Verlin Dike NP called back.  Explained pt and wife's concerns of starting heparin and the need to speak with someone from neuro surgery before making decision about heparin.  Meyran NP spoke with pt and wife over the phone.  After speaking with Meyran the pt and wife have decided to proceed with the heparin.   [name]

## 2023-12-24 NOTE — Progress Notes (Signed)
12/23/2023 2051 Anthoney Harada NP wants nurse to notify neuro surgery, pt and wife would like to speak with neuro surgery before starting heparin.   Nicholas Stevens

## 2023-12-24 NOTE — Progress Notes (Signed)
PHARMACY - ANTICOAGULATION Pharmacy Consult for heparin Indication: DVT Brief A/P: Heparin level supratherapeutic (0.94). Decrease Heparin rate No signs of bleeding or issues with the heparin gtt  No Known Allergies  Patient Measurements: Height: 6\' 2"  (188 cm) Weight: 121 kg (266 lb 12.1 oz) IBW/kg (Calculated) : 82.2 Heparin Dosing Weight: 108 kg  Vital Signs: Temp: 98.1 F (36.7 C) (12/29 1120) Temp Source: Oral (12/29 1120) BP: 156/101 (12/29 1120) Pulse Rate: 79 (12/29 1120)  Labs: Recent Labs    12/24/23 0357 12/24/23 1100  HGB 17.9*  --   HCT 53.8*  --   PLT 229  --   HEPARINUNFRC 0.77* 0.94*    Estimated Creatinine Clearance: 135.7 mL/min (by C-G formula based on SCr of 0.86 mg/dL).   Medical History: Past Medical History:  Diagnosis Date   DDD (degenerative disc disease), lumbar    HNP (herniated nucleus pulposus)    Hypertension    Liver hemangioma    Morbid obesity (HCC)    OSA (obstructive sleep apnea)    no cpap used since weight loss   Overweight(278.02)    Plantar fasciitis of right foot    Sleep apnea    Tinea barbae       Assessment: 52 yo male with LLE DVT s/p craniotomy for tumor resection 12/18/23 for heparin   Goal of Therapy:  Heparin level 0.3-0.5 units/ml Monitor platelets by anticoagulation protocol: Yes   Plan:  Decrease Heparin 1050 units/hr Check heparin level in 8 hours.   Greta Doom BS, PharmD, BCPS Clinical Pharmacist 12/24/2023 12:34 PM  Contact: [phone] after 3 PM  "Be curious, not judgmental..." -Debbora Dus

## 2023-12-24 NOTE — Progress Notes (Signed)
Providing Compassionate, Quality Care - Together   Subjective: Patient reports the pain in his left calf is improving. Has some pain around his right ankle as well that has improved since the heparin drip was started.  Objective: Vital signs in last 24 hours: Temp:  [97.2 F (36.2 C)-98.4 F (36.9 C)] 98.1 F (36.7 C) (12/29 0321) Pulse Rate:  [71-83] 74 (12/29 0321) Resp:  [12-20] 12 (12/29 0321) BP: (123-160)/(72-107) 123/88 (12/29 0321) SpO2:  [64 %-94 %] 94 % (12/29 0321) Weight:  [811 kg] 121 kg (12/29 0500)  Intake/Output from previous day: 12/28 0701 - 12/29 0700 In: -  Out: 1 [Stool:1] Intake/Output this shift: No intake/output data recorded.  Alert and oriented x 4 PERRLA CN II-XII grossly intact MAE, R > L, sensation intact bilaterally Incision is closed with staples. Incision is clean, dry, and intact.   Lab Results: Recent Labs    12/24/23 0357  WBC 12.7*  HGB 17.9*  HCT 53.8*  PLT 229   BMET No results for input(s): "NA", "K", "CL", "CO2", "GLUCOSE", "BUN", "CREATININE", "CALCIUM" in the last 72 hours.  Studies/Results: VAS [place] LOWER EXTREMITY VENOUS (DVT) Result Date: 12/23/2023  Lower Venous DVT Study Patient Name:  Nicholas Stevens  Date of Exam:   12/23/2023 Medical Rec #: [id number]            Accession #:    [id number] Date of Birth: [birth year]           Patient Gender: M Patient Age:   52 years Exam Location:  [facility] Procedure:      VAS [place] LOWER EXTREMITY VENOUS (DVT) Referring Phys: [facility] Faysal Fenoglio --------------------------------------------------------------------------------  Indications: Pain, and Inability to walk due to pain.  Comparison Study: No prior exam. Performing Technologist: Fernande Bras  Examination Guidelines: A complete evaluation includes B-mode imaging, spectral Doppler, color Doppler, and power Doppler as needed of all accessible portions of each vessel. Bilateral testing is considered an integral part of  a complete examination. Limited examinations for reoccurring indications may be performed as noted. The reflux portion of the exam is performed with the patient in reverse Trendelenburg.  +-----+---------------+---------+-----------+----------+--------------+ RIGHTCompressibilityPhasicitySpontaneityPropertiesThrombus Aging +-----+---------------+---------+-----------+----------+--------------+ CFV  Full           Yes      Yes                                 +-----+---------------+---------+-----------+----------+--------------+ SFJ  Full           Yes      Yes                                 +-----+---------------+---------+-----------+----------+--------------+   +---------+---------------+---------+-----------+----------+--------------+ LEFT     CompressibilityPhasicitySpontaneityPropertiesThrombus Aging +---------+---------------+---------+-----------+----------+--------------+ CFV      Full           Yes      Yes                                 +---------+---------------+---------+-----------+----------+--------------+ SFJ      Full           Yes      Yes                                 +---------+---------------+---------+-----------+----------+--------------+  FV Prox  Full                                                        +---------+---------------+---------+-----------+----------+--------------+ FV Mid   Full                                                        +---------+---------------+---------+-----------+----------+--------------+ FV DistalFull                                                        +---------+---------------+---------+-----------+----------+--------------+ PFV      Full                                                        +---------+---------------+---------+-----------+----------+--------------+ POP      Full           Yes      Yes                                  +---------+---------------+---------+-----------+----------+--------------+ PTV      Full                                                        +---------+---------------+---------+-----------+----------+--------------+ PERO     None           No       No                                  +---------+---------------+---------+-----------+----------+--------------+     Summary: RIGHT: - No evidence of common femoral vein obstruction.   LEFT: - Findings consistent with acute deep vein thrombosis involving the left peroneal veins.  - No cystic structure found in the popliteal fossa.  *See table(s) above for measurements and observations.    Preliminary     Assessment/Plan: Patient underwent right craniotomy for resection of frontal lobe tumor. Awaiting pathology.   LOS: 9 days   -Continue heparin drip for now. Can transition to oral anticoagulation this week. -Plan is for CIR at discharge.   Val Eagle, DNP, AGNP-C Nurse Practitioner  [facility] [address], [address], [place], [place] [address] P: [phone]    F: [phone]  12/24/2023, 9:04 AM

## 2023-12-24 NOTE — Progress Notes (Signed)
12/28/20214 2156 Virgel Manifold NP messaged back and will place order for pharmacy consult to start heparin drip. [name]

## 2023-12-24 NOTE — Progress Notes (Signed)
Pt and wife voiced concern to this RN after learning of admission to CIR today. They are concerned about his bleeding risks and if there would be enough monitoring at rehab. This RN explained that the [facility] CIR has competence to manage heparin drips and DVT cases. Pt and family was further reassured by Dr. Riley Kill from CIR. Natale Milch MD notified and refers to primary surgeon Dr. Luanna Salk team.  Doran Durand NP from [facility] notified and states pt remain on 4NP until Dr. Maisie Fus evaluates pt tomorrow.

## 2023-12-24 NOTE — Progress Notes (Signed)
PHARMACY - ANTICOAGULATION Pharmacy Consult for heparin Indication: DVT Brief A/P: Heparin level supratherapeutic Decrease Heparin rate  No Known Allergies  Patient Measurements: Height: 6\' 2"  (188 cm) Weight: 120.6 kg (265 lb 14 oz) IBW/kg (Calculated) : 82.2 Heparin Dosing Weight: 108 kg  Vital Signs: Temp: 98.1 F (36.7 C) (12/29 0321) Temp Source: Oral (12/29 0321) BP: 123/88 (12/29 0321) Pulse Rate: 74 (12/29 0321)  Labs: Recent Labs    12/24/23 0357  HGB 17.9*  HCT 53.8*  PLT 229  HEPARINUNFRC 0.77*    Estimated Creatinine Clearance: 135.6 mL/min (by C-G formula based on SCr of 0.86 mg/dL).   Medical History: Past Medical History:  Diagnosis Date   DDD (degenerative disc disease), lumbar    HNP (herniated nucleus pulposus)    Hypertension    Liver hemangioma    Morbid obesity (HCC)    OSA (obstructive sleep apnea)    no cpap used since weight loss   Overweight(278.02)    Plantar fasciitis of right foot    Sleep apnea    Tinea barbae       Assessment: 52 yo male with LLE DVT s/p craniotomy for tumor resection 12/18/23 for heparin   Goal of Therapy:  Heparin level 0.3-0.5 units/ml Monitor platelets by anticoagulation protocol: Yes   Plan:  Decrease Heparin 1250 units/hr Check heparin level in 8 hours.   Geannie Risen, PharmD, BCPS

## 2023-12-24 NOTE — Discharge Summary (Signed)
Physician Discharge Summary  Nicholas Stevens FAO:[id number] DOB: Jun 30, 1971 DOA: 12/15/2023  PCP: Tresa Garter, MD  Admit date: 12/15/2023 Discharge date: 12/24/2023  Admitted From: Home Disposition: CIR  Recommendations for Outpatient Follow-up:  Follow up with PCP in 1-2 weeks Follow-up with neurosurgery as discussed:  Discharge Condition: Stable CODE STATUS: Full Diet recommendation: Low-salt low-fat low-carb diet as tolerated  Brief/Interim Summary: 52 year old male with known OSA admitted with right frontal mass with hemorrhage. Wife at bedside and assisting with history. Patient noticed a decrease in response time while driving yesterday and new onset episodes of decrease use of left side. This worsened this morning, with loss of coordination, including where he lost his balance and fell against the wall, denies hitting head or body, prompting to seek further medical treatment. CODE stroke imaging was obtained due to symptoms and revealed a partially hemorrhagic lesion in the superior right frontal lobe with local mass effect. 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 Status post craniotomy/tumor resection 12/18/23 without complication.   Post operative care ongoing, therapy recommending inpatient rehab which has been complicated by acute LLE DVT.  Lengthy discussion between myself neurosurgery and inpatient rehab about bleeding risks and anticoagulation.  Ultimately decision to initiate heparin drip without bolus per neurosurgery recommendations on the 28th, patient remains medically stable for discharge; however given his concern over bleeding risk are requesting to stay in facility for additional 24 hours until he can be evaluated by his primary surgeon/surgery team (Dr. Maisie Fus).  At this point patient is medically stable from  hospitalist service standpoint -awaiting further clearance from primary neurosurgeon in the setting of DVT status post craniotomy and frontal lobe tumor resection.    Discharge Diagnoses:  Principal Problem:   Brain mass    Discharge Instructions     No Known Allergies  Consultations: Neurosurgery, PCCM   Procedures/Studies: VAS [place] LOWER EXTREMITY VENOUS (DVT) Result Date: 12/24/2023  Lower Venous DVT Study Patient Name:  Nicholas Stevens  Date of Exam:   12/23/2023 Medical Rec #: [id number]            Accession #:    [id number] Date of Birth: 1971/01/09           Patient Gender: M Patient Age:   85 years Exam Location:  [facility] Procedure:      VAS [place] LOWER EXTREMITY VENOUS (DVT) Referring Phys: [facility] BERGMAN --------------------------------------------------------------------------------  Indications: Pain, and Inability to walk due to pain.  Comparison Study: No prior exam. Performing Technologist: Fernande Bras  Examination Guidelines: A complete evaluation includes B-mode imaging, spectral Doppler, color Doppler, and power Doppler as needed of all accessible portions of each vessel. Bilateral testing is considered an integral part of a complete examination. Limited examinations for reoccurring indications may be performed as noted. The reflux portion of the exam is performed with the patient in reverse Trendelenburg.  +-----+---------------+---------+-----------+----------+--------------+ RIGHTCompressibilityPhasicitySpontaneityPropertiesThrombus Aging +-----+---------------+---------+-----------+----------+--------------+ CFV  Full           Yes      Yes                                 +-----+---------------+---------+-----------+----------+--------------+ SFJ  Full           Yes      Yes                                 +-----+---------------+---------+-----------+----------+--------------+    +---------+---------------+---------+-----------+----------+--------------+  LEFT     CompressibilityPhasicitySpontaneityPropertiesThrombus Aging +---------+---------------+---------+-----------+----------+--------------+ CFV      Full           Yes      Yes                                 +---------+---------------+---------+-----------+----------+--------------+ SFJ      Full           Yes      Yes                                 +---------+---------------+---------+-----------+----------+--------------+ FV Prox  Full                                                        +---------+---------------+---------+-----------+----------+--------------+ FV Mid   Full                                                        +---------+---------------+---------+-----------+----------+--------------+ FV DistalFull                                                        +---------+---------------+---------+-----------+----------+--------------+ PFV      Full                                                        +---------+---------------+---------+-----------+----------+--------------+ POP      Full           Yes      Yes                                 +---------+---------------+---------+-----------+----------+--------------+ PTV      Full                                                        +---------+---------------+---------+-----------+----------+--------------+ PERO     None           No       No                                  +---------+---------------+---------+-----------+----------+--------------+     Summary: RIGHT: - No evidence of common femoral vein obstruction.   LEFT: - Findings consistent with acute deep vein thrombosis involving the left peroneal veins.  - No cystic structure found in the popliteal fossa.  *See table(s) above for measurements and observations. Electronically signed by Heath Lark on 12/24/2023 at 12:31:39 PM.  Final     MR BRAIN W WO CONTRAST Result Date: 12/19/2023 CLINICAL DATA:  Follow-up craniotomy. EXAM: MRI HEAD WITHOUT AND WITH CONTRAST TECHNIQUE: Multiplanar, multiecho pulse sequences of the brain and surrounding structures were obtained without and with intravenous contrast. CONTRAST:  10 mL GADAVIST GADOBUTROL 1 MMOL/ML IV SOLN COMPARISON:  Preoperative imaging 12/16/2023 FINDINGS: Brain: Status post right frontal craniotomy for debulking of a previously seen 5.6 cm hemorrhagic and necrotic mass at the right posterior frontal vertex. Good debulking with collapse of the lesion, the region enhancement around the margins now measuring 3.2 cm cephalo caudal, 2.8 cm front to back and 2.0 cm right to left. Small amount of air or blood products in the central portion. Persistent regional edema as would be expected. Less mass effect. Right-to-left shift now measuring 5 mm. Mass-effect upon the frontal horn of the right lateral ventricle. Other nearby error is of tumor infiltration including the frontal operculum, basal ganglia and cingulate gyrus persist without contrast enhancement in those regions. No hydrocephalus. No extra-axial collection. Vascular: Major vessels at the base of the brain show flow. Skull and upper cervical spine: Negative Sinuses/Orbits: Clear/normal Other: None IMPRESSION: 1. Good debulking of a previously seen 5.6 cm hemorrhagic and necrotic mass at the right posterior frontal vertex. Small amount of air or blood products in the central portion of the lesion. Collapse of the lesion with a region of enhancement now measuring 3.2 x 2.8 x 2.0 cm. Persistent regional edema as would be expected. Less mass effect. Right-to-left shift now measuring 5 mm. Mass-effect upon the frontal horn of the right lateral ventricle. 2. Other nearby areas of tumor infiltration including the frontal operculum, basal ganglia and cingulate gyrus persist without contrast enhancement in those regions. Electronically Signed    By: Paulina Fusi M.D.   On: 12/19/2023 08:58   MR BRAIN W WO CONTRAST Result Date: 12/16/2023 CLINICAL DATA:  Brain neoplasm staging EXAM: MRI HEAD WITHOUT AND WITH CONTRAST TECHNIQUE: Multiplanar, multiecho pulse sequences of the brain and surrounding structures were obtained without and with intravenous contrast. CONTRAST:  10mL GADAVIST GADOBUTROL 1 MMOL/ML IV SOLN COMPARISON:  12/15/2023 brain MRI FINDINGS: An abbreviated protocol was performed, consisting of axial, sagittal and coronal T1-weighted images and axial FLAIR sequence. Brain: Unchanged size of large mass centered in the right frontal lobe, measuring 4.7 x 3.8 by 4.8 cm. There is a large amount of surrounding vasogenic edema. 8 mm leftward midline shift. This study was performed for stereotactic localization. IMPRESSION: Unchanged size of large mass centered in the right frontal lobe, measuring 4.7 x 3.8 by 4.8 cm. Large amount of surrounding vasogenic edema. 8 mm leftward midline shift. Electronically Signed   By: Deatra Robinson M.D.   On: 12/16/2023 20:44   CT CHEST ABDOMEN PELVIS W CONTRAST Result Date: 12/16/2023 CLINICAL DATA:  CNS neoplasm.  Staging study.  * Tracking Code: BO * EXAM: CT CHEST, ABDOMEN, AND PELVIS WITH CONTRAST TECHNIQUE: Multidetector CT imaging of the chest, abdomen and pelvis was performed following the standard protocol during bolus administration of intravenous contrast. RADIATION DOSE REDUCTION: This exam was performed according to the departmental dose-optimization program which includes automated exposure control, adjustment of the mA and/or kV according to patient size and/or use of iterative reconstruction technique. CONTRAST:  75mL OMNIPAQUE IOHEXOL 350 MG/ML SOLN COMPARISON:  Chest CTA 03/11/2023. FINDINGS: CT CHEST FINDINGS Cardiovascular: The heart size is normal. No substantial pericardial effusion. No thoracic aortic aneurysm. No substantial atherosclerosis of the thoracic aorta. Mediastinum/Nodes: No  mediastinal lymphadenopathy. There is no hilar lymphadenopathy. The esophagus has normal imaging features. There is no axillary lymphadenopathy. Lungs/Pleura: Subsegmental atelectasis noted posterior right lung base. No suspicious pulmonary nodule or mass. No focal airspace consolidation. No pleural effusion. Musculoskeletal: No worrisome lytic or sclerotic osseous abnormality. CT ABDOMEN PELVIS FINDINGS Hepatobiliary: Jen hemangioma identified right hepatic lobe with exophytic hemangioma posterior left hepatic lobe. These were characterized by MRI back in 2014 and are similar in the interval. Gallbladder is decompressed. No intrahepatic or extrahepatic biliary dilation. Pancreas: No focal mass lesion. No dilatation of the main duct. No intraparenchymal cyst. No peripancreatic edema. Spleen: No splenomegaly. No suspicious focal mass lesion. Adrenals/Urinary Tract: No adrenal nodule or mass. Kidneys unremarkable. No evidence for hydroureter. Bladder is decompressed by Foley catheter. Stomach/Bowel: Surgical changes are noted in the stomach, compatible with sleeve gastrectomy. Duodenum is normally positioned as is the ligament of Treitz. No small bowel wall thickening. No small bowel dilatation. The terminal ileum is normal. Nonvisualization of the appendix is consistent with the reported history of appendectomy. No gross colonic mass. No colonic wall thickening. Vascular/Lymphatic: There is mild atherosclerotic calcification of the abdominal aorta without aneurysm. There is no gastrohepatic or hepatoduodenal ligament lymphadenopathy. No retroperitoneal or mesenteric lymphadenopathy. No pelvic sidewall lymphadenopathy. Reproductive: Prostate gland upper normal for size. Other: No intraperitoneal free fluid. Musculoskeletal: No worrisome lytic or sclerotic osseous abnormality. IMPRESSION: 1. No evidence for metastatic disease in the chest, abdomen, or pelvis. 2. Surgical changes of sleeve gastrectomy. 3. Giant right  cavernous hemangioma with exophytic left hepatic lobe hemangioma. These were characterized by and are similar to abdominal MRI 11/10/2013. 4.  Aortic Atherosclerosis (ICD10-I70.0). Electronically Signed   By: Kennith Center M.D.   On: 12/16/2023 19:20   CT HEAD WO CONTRAST ( ) Result Date: 12/15/2023 CLINICAL DATA:  Mental status change, persistent or worsening EXAM: CT HEAD WITHOUT CONTRAST TECHNIQUE: Contiguous axial images were obtained from the base of the skull through the vertex without intravenous contrast. RADIATION DOSE REDUCTION: This exam was performed according to the departmental dose-optimization program which includes automated exposure control, adjustment of the mA and/or kV according to patient size and/or use of iterative reconstruction technique. COMPARISON:  CT head and MRI head from earlier today. FINDINGS: Brain: In comparison to same day CT head, new/increased 1.7 cm focus of hyperdense hemorrhage along the inferior aspect of the hemorrhagic mass (series 3, image 24; series 5, image 47 in the right frontal lobe with similar mass effect and surrounding edema. No hydrocephalus. No evidence of acute large vascular territory infarct. Vascular: No hyperdense vessel. Skull: No acute fracture. Sinuses/Orbits: Mild paranasal sinus mucosal thickening. No acute orbital findings. IMPRESSION: In comparison to same day CT head, new/increased 1.7 cm focus of hyperdense hemorrhage along the inferior aspect of the hemorrhagic mass in the right frontal lobe with similar mass effect and surrounding edema. The mass is further characterized on same day MRI. Electronically Signed   By: Feliberto Harts M.D.   On: 12/15/2023 15:41   MR Brain W and Wo Contrast Result Date: 12/15/2023 CLINICAL DATA:  Headache. EXAM: MRI HEAD WITHOUT AND WITH CONTRAST TECHNIQUE: Multiplanar, multiecho pulse sequences of the brain and surrounding structures were obtained without and with intravenous contrast. CONTRAST:  10mL  GADAVIST GADOBUTROL 1 MMOL/ML IV SOLN COMPARISON:  Head CT from earlier today FINDINGS: Brain: Known mass at the superior right frontal lobe with heterogeneous enhancement and a dominant rim enhancing cavity with irregular margination and internal blood products, the enhancing area  in total measures up to 4.5 cm craniocaudal. There is a rim of T2 hyperintensity and expansion with contiguous infiltrative cortical and white matter T2 signal in the adjacent right frontal lobe that spans both ACA and MCA distributions. This nonenhancing infiltrative appearance is most suggestive of underlying glioma rather than solitary metastasis. There is local mass effect with very early right uncal herniation on coronal T2 weighted imaging. No mass seen in this area on 2016 head CT. Vascular: Major flow voids and vascular enhancements are preserved Skull and upper cervical spine: Normal marrow signal Sinuses/Orbits: Negative IMPRESSION: Necrotic and hemorrhagic mass in the superior right frontal lobe with regional infiltrative signal abnormality, features of glioblastoma. Mass effect causes very early right uncal herniation on coronal images. Electronically Signed   By: Tiburcio Pea M.D.   On: 12/15/2023 08:45   DG Chest Portable 1 View Result Date: 12/15/2023 CLINICAL DATA:  Evaluate for weakness. EXAM: PORTABLE CHEST 1 VIEW COMPARISON:  03/12/2023 FINDINGS: There is asymmetric elevation of the right hemidiaphragm. Normal cardiomediastinal contours. No pleural fluid, interstitial edema or airspace consolidation. Visualized osseous structures are unremarkable. IMPRESSION: 1. No acute cardiopulmonary disease. 2. Asymmetric elevation of the right hemidiaphragm. Electronically Signed   By: Signa Kell M.D.   On: 12/15/2023 06:13   CT ANGIO HEAD NECK W WO CM Result Date: 12/15/2023 CLINICAL DATA:  Neuro deficit with stroke suspected. EXAM: CT ANGIOGRAPHY HEAD AND NECK WITH AND WITHOUT CONTRAST TECHNIQUE: Multidetector CT  imaging of the head and neck was performed using the standard protocol during bolus administration of intravenous contrast. Multiplanar CT image reconstructions and MIPs were obtained to evaluate the vascular anatomy. Carotid stenosis measurements (when applicable) are obtained utilizing NASCET criteria, using the distal internal carotid diameter as the denominator. RADIATION DOSE REDUCTION: This exam was performed according to the departmental dose-optimization program which includes automated exposure control, adjustment of the mA and/or kV according to patient size and/or use of iterative reconstruction technique. CONTRAST:  75mL OMNIPAQUE IOHEXOL 350 MG/ML SOLN COMPARISON:  Head CT from earlier today. FINDINGS: CTA NECK FINDINGS Aortic arch: Unremarkable Right carotid system: Accounting for motion artifact vessels are smoothly contoured and widely patent with mild atheromatous plaque at the bifurcation. Left carotid system: Accounting for motion artifact vessels are smoothly contoured and diffusely patent with minimal atheromatous plaque at the bifurcation. Vertebral arteries: No proximal subclavian or vertebral stenosis. No evidence of beading or dissection. Skeleton: Posterior longitudinal ligament ossification to a mild degree at mid and lower cervical levels, which could contact the ventral cord. Other neck: No acute or aggressive finding Upper chest: Clear apical lungs. Review of the MIP images confirms the above findings CTA HEAD FINDINGS Anterior circulation: Hemorrhagic lesion at the superior right frontal lobe shows no spot sign or underlying aneurysm/vascular malformation. No major branch occlusion, beading, or flow reducing stenosis. Posterior circulation: The vertebral and basilar arteries are smoothly contoured and diffusely patent. No branch occlusion, beading, or aneurysm. Venous sinuses: Diffusely patent, including at the superior sagittal sinus. Anatomic variants: None significant Review of the  MIP images confirms the above findings IMPRESSION: 1. No vascular lesion or spot sign seen at the hemorrhagic right superior frontal lesion. 2. Mild atherosclerosis. 3. Intermittent motion artifact especially affecting the neck. Electronically Signed   By: Tiburcio Pea M.D.   On: 12/15/2023 06:08   CT HEAD WO CONTRAST Result Date: 12/15/2023 CLINICAL DATA:  Neuro deficit with acute stroke suspected EXAM: CT HEAD WITHOUT CONTRAST TECHNIQUE: Contiguous axial images were obtained from  the base of the skull through the vertex without intravenous contrast. RADIATION DOSE REDUCTION: This exam was performed according to the departmental dose-optimization program which includes automated exposure control, adjustment of the mA and/or kV according to patient size and/or use of iterative reconstruction technique. COMPARISON:  08/04/2015 FINDINGS: Brain: Low-density area in the superior right frontal lobe with iso and hyperdense center. The hemorrhagic area measures up to 2.7 x 1.7 cm. Prominent degree of adjacent low-density with isodense and partially cystic center which may favors underlying mass. No hyperdensity in the adjacent superior sagittal sinus. There is local mass effect and leftward midline shift without herniation. No hydrocephalus or extra-axial collection. Vascular: No hyperdense vessel or unexpected calcification. Skull: Normal. Negative for fracture or focal lesion. Sinuses/Orbits: No acute finding Case discussed in epic chat, hemorrhage is already known to provider. IMPRESSION: Partially hemorrhagic lesion in the superior right frontal lobe with local mass effect. Suspect underlying mass, recommend brain MRI with contrast. Electronically Signed   By: Tiburcio Pea M.D.   On: 12/15/2023 06:00     Subjective: No acute issues or events overnight, left leg pain improving but not resolved otherwise denies nausea vomiting diarrhea constipation any fevers chills or chest pain   Discharge  Exam: Vitals:   12/24/23 0800 12/24/23 1120  BP: 135/84 (!) 156/101  Pulse: 87 79  Resp: 18 15  Temp: 98.3 F (36.8 C) 98.1 F (36.7 C)  SpO2: 93% 93%   Vitals:   12/24/23 0321 12/24/23 0500 12/24/23 0800 12/24/23 1120  BP: 123/88  135/84 (!) 156/101  Pulse: 74  87 79  Resp: 12  18 15   Temp: 98.1 F (36.7 C)  98.3 F (36.8 C) 98.1 F (36.7 C)  TempSrc: Oral  Oral Oral  SpO2: 94%  93% 93%  Weight:  121 kg    Height:        General: Pt is alert, awake, not in acute distress Cardiovascular: RRR, S1/S2 +, no rubs, no gallops Respiratory: CTA bilaterally, no wheezing, no rhonchi Abdominal: Soft, NT, ND, bowel sounds + Extremities: no edema, no cyanosis    The results of significant diagnostics from this hospitalization (including imaging, microbiology, ancillary and laboratory) are listed below for reference.     Microbiology: Recent Results (from the past 240 hours)  MRSA Next Gen by PCR, Nasal     Status: None   Collection Time: 12/15/23  8:28 PM   Specimen: Nasal Mucosa; Nasal Swab  Result Value Ref Range Status   MRSA by PCR Next Gen NOT DETECTED NOT DETECTED Final    Comment: (NOTE) The GeneXpert MRSA Assay (FDA approved for NASAL specimens only), is one component of a comprehensive MRSA colonization surveillance program. It is not intended to diagnose MRSA infection nor to guide or monitor treatment for MRSA infections. Test performance is not FDA approved in patients less than 28 years old. Performed at [facility], [address] [place], [place] [address]   Surgical PCR screen     Status: None   Collection Time: 12/17/23  9:29 PM   Specimen: Nasal Mucosa; Nasal Swab  Result Value Ref Range Status   MRSA, PCR NEGATIVE NEGATIVE Final   Staphylococcus aureus NEGATIVE NEGATIVE Final    Comment: (NOTE) The Xpert SA Assay (FDA approved for NASAL specimens in patients 17 years of age and older), is one component of a comprehensive surveillance  program. It is not intended to diagnose infection nor to guide or monitor treatment. Performed at [facility], [address] [place], [place] [address]      Labs: BNP (last 3 results) No results for input(s): "BNP" in the last 8760 hours. Basic Metabolic Panel: Recent Labs  Lab 12/18/23 0433 12/19/23 1323 12/20/23 2327  NA 136 138 139  K 4.2 3.6 4.5  CL 105 106 107  CO2 22 21* 22  GLUCOSE 123* 130* 105*  BUN 16 18 18   CREATININE 0.93 0.94 0.86  CALCIUM 8.8* 8.4* 8.6*  MG  --   --  2.4   Liver Function Tests: No results for input(s): "AST", "ALT", "ALKPHOS", "BILITOT", "PROT", "ALBUMIN" in the last 168 hours. No results for input(s): "LIPASE", "AMYLASE" in the last 168 hours. No results for input(s): "AMMONIA" in the last 168 hours. CBC: Recent Labs  Lab 12/18/23 0433 12/19/23 1323 12/24/23 0357  WBC 8.7 10.0 12.7*  NEUTROABS  --  8.3*  --   HGB 17.9* 17.7* 17.9*  HCT 55.3* 54.5* 53.8*  MCV 83.4 83.3 81.6  PLT 304 280 229   Cardiac Enzymes: No results for input(s): "CKTOTAL", "CKMB", "CKMBINDEX", "TROPONINI" in the last 168 hours. BNP: Invalid input(s): "POCBNP" CBG: Recent Labs  Lab 12/23/23 1856 12/23/23 2203 12/24/23 0607 12/24/23 0747 12/24/23 1119  GLUCAP 179* 122* 123* 144* 178*   D-Dimer No results for input(s): "DDIMER" in the last 72 hours. Hgb A1c No results for input(s): "HGBA1C" in the last 72 hours. Lipid Profile No results for input(s): "CHOL", "HDL", "LDLCALC", "TRIG", "CHOLHDL", "LDLDIRECT" in the last 72 hours. Thyroid function studies No results for input(s): "TSH", "T4TOTAL", "T3FREE", "THYROIDAB" in the last 72 hours.  Invalid input(s): "FREET3" Anemia work up No results for input(s): "VITAMINB12", "FOLATE", "FERRITIN", "TIBC", "IRON", "RETICCTPCT" in the last 72 hours. Urinalysis    Component Value Date/Time   COLORURINE YELLOW 12/15/2023 0606   APPEARANCEUR CLEAR 12/15/2023 0606   LABSPEC 1.019 12/15/2023 0606    PHURINE 7.5 12/15/2023 0606   GLUCOSEU NEGATIVE 12/15/2023 0606   GLUCOSEU NEGATIVE 01/19/2023 1026   HGBUR NEGATIVE 12/15/2023 0606   BILIRUBINUR NEGATIVE 12/15/2023 0606   KETONESUR NEGATIVE 12/15/2023 0606   PROTEINUR NEGATIVE 12/15/2023 0606   UROBILINOGEN 0.2 01/19/2023 1026   NITRITE NEGATIVE 12/15/2023 0606   LEUKOCYTESUR NEGATIVE 12/15/2023 0606   Sepsis Labs Recent Labs  Lab 12/18/23 0433 12/19/23 1323 12/24/23 0357  WBC 8.7 10.0 12.7*   Microbiology Recent Results (from the past 240 hours)  MRSA Next Gen by PCR, Nasal     Status: None   Collection Time: 12/15/23  8:28 PM   Specimen: Nasal Mucosa; Nasal Swab  Result Value Ref Range Status   MRSA by PCR Next Gen NOT DETECTED NOT DETECTED Final    Comment: (NOTE) The GeneXpert MRSA Assay (FDA approved for NASAL specimens only), is one component of a comprehensive MRSA colonization surveillance program. It is not intended to diagnose MRSA infection nor to guide or monitor treatment for MRSA infections. Test performance is not FDA approved in patients less than 51 years old. Performed at [facility], [address] [place], [place] [address]   Surgical PCR screen     Status: None   Collection Time: 12/17/23  9:29 PM   Specimen: Nasal Mucosa; Nasal Swab  Result Value Ref Range Status   MRSA, PCR NEGATIVE NEGATIVE Final   Staphylococcus aureus NEGATIVE NEGATIVE Final    Comment: (NOTE) The Xpert SA Assay (FDA approved for NASAL specimens in patients 75 years of age and older), is one  component of a comprehensive surveillance program. It is not intended to diagnose infection nor to guide or monitor treatment. Performed at [facility], [address] [place], [place] [address]      Time coordinating discharge: Over 30 minutes  SIGNED:   Azucena Fallen, DO [facility] 12/24/2023, 1:00 PM Pager   If 7PM-7AM, please contact night-coverage [email or web address]

## 2023-12-24 NOTE — Progress Notes (Signed)
12/23/2023 2012 Notified Anthoney Harada of pt's and wife's conerns of starting heparin and that pt and wife would like to speak with her about the risks/benefits of the heparin considering recent brain surgery.  Pt and wife stated they have been waiting for someone to come speak with them about the heparin. [name]

## 2023-12-24 NOTE — Progress Notes (Signed)
Inpatient Rehab Admissions Coordinator:  Pt and wife would like to hold admission to CIR today. Awaiting assessment from Dr. Maisie Fus on Monday. Will continue to follow.   Wolfgang Phoenix, MS, CCC-SLP Admissions Coordinator [phone]

## 2023-12-24 NOTE — Progress Notes (Addendum)
12/23/2023 2108 Nurse called neuro surgery paging system.  On call will be notified. [name]

## 2023-12-25 ENCOUNTER — Inpatient Hospital Stay: Payer: Commercial Managed Care - PPO | Attending: Neurosurgery

## 2023-12-25 DIAGNOSIS — G9389 Other specified disorders of brain: Secondary | ICD-10-CM | POA: Diagnosis not present

## 2023-12-25 LAB — GLUCOSE, CAPILLARY
Glucose-Capillary: 132 mg/dL — ABNORMAL HIGH (ref 70–99)
Glucose-Capillary: 105 mg/dL — ABNORMAL HIGH (ref 70–99)
Glucose-Capillary: 147 mg/dL — ABNORMAL HIGH (ref 70–99)
Glucose-Capillary: 198 mg/dL — ABNORMAL HIGH (ref 70–99)

## 2023-12-25 LAB — CBC
HCT: 53.1 % — ABNORMAL HIGH (ref 39.0–52.0)
Hemoglobin: 17.5 g/dL — ABNORMAL HIGH (ref 13.0–17.0)
MCH: 27.1 pg (ref 26.0–34.0)
MCHC: 33 g/dL (ref 30.0–36.0)
MCV: 82.3 fL (ref 80.0–100.0)
Platelets: 216 10*3/uL (ref 150–400)
RBC: 6.45 MIL/uL — ABNORMAL HIGH (ref 4.22–5.81)
RDW: 18 % — ABNORMAL HIGH (ref 11.5–15.5)
WBC: 12.1 10*3/uL — ABNORMAL HIGH (ref 4.0–10.5)
nRBC: 0 % (ref 0.0–0.2)

## 2023-12-25 LAB — HEPARIN LEVEL (UNFRACTIONATED)
Heparin Unfractionated: 0.2 [IU]/mL — ABNORMAL LOW (ref 0.30–0.70)
Heparin Unfractionated: 0.37 [IU]/mL (ref 0.30–0.70)

## 2023-12-25 MED ORDER — APIXABAN 5 MG PO TABS
5.0000 mg | ORAL_TABLET | Freq: Two times a day (BID) | ORAL | Status: DC
  Administered 2023-12-25 – 2023-12-26 (×2): 5 mg via ORAL
  Filled 2023-12-25 (×2): qty 1

## 2023-12-25 NOTE — Progress Notes (Signed)
PHARMACY - ANTICOAGULATION Pharmacy Consult for heparin Indication: DVT   No Known Allergies  Patient Measurements: Height: 6\' 2"  (188 cm) Weight: 121 kg (266 lb 12.1 oz) IBW/kg (Calculated) : 82.2 Heparin Dosing Weight: 108 kg  Vital Signs: Temp: 97.6 F (36.4 C) (12/30 1110) Temp Source: Oral (12/30 1110) BP: 140/90 (12/30 1110) Pulse Rate: 80 (12/30 1110)  Labs: Recent Labs    12/24/23 0357 12/24/23 1100 12/24/23 2133 12/25/23 0445 12/25/23 1342  HGB 17.9*  --   --  17.5*  --   HCT 53.8*  --   --  53.1*  --   PLT 229  --   --  216  --   HEPARINUNFRC 0.77*   < > 0.42 0.20* 0.37   < > = values in this interval not displayed.    Estimated Creatinine Clearance: 135.7 mL/min (by C-G formula based on SCr of 0.86 mg/dL).   Medical History: Past Medical History:  Diagnosis Date   DDD (degenerative disc disease), lumbar    HNP (herniated nucleus pulposus)    Hypertension    Liver hemangioma    Morbid obesity (HCC)    OSA (obstructive sleep apnea)    no cpap used since weight loss   Overweight(278.02)    Plantar fasciitis of right foot    Sleep apnea    Tinea barbae       Assessment: 52 yo male with LLE DVT s/p craniotomy for tumor resection 12/18/23 for heparin   Heparin level this AM drifted down to subtherapeutic on 1050 units/hr  12/30 PM update HL 0.37 No signs of bleeding or issues with hep gtt  Goal of Therapy:  Heparin level 0.3-0.5 units/ml Monitor platelets by anticoagulation protocol: Yes   Plan:  Continue heparin gtt to 1150 units/hr F/u heparin level 2300 F/u [facility] plan and ability to transition to PO   Ezzie Senat BS, PharmD, BCPS Clinical Pharmacist 12/25/2023 3:38 PM  Contact: [phone] after 3 PM  "Be curious, not judgmental..." -Debbora Dus

## 2023-12-25 NOTE — Progress Notes (Signed)
PHARMACY - ANTICOAGULATION Pharmacy Consult for heparin >> apixaban Indication: DVT   No Known Allergies  Patient Measurements: Height: 6\' 2"  (188 cm) Weight: 121 kg (266 lb 12.1 oz) IBW/kg (Calculated) : 82.2 Heparin Dosing Weight: 108 kg  Vital Signs: Temp: 97.6 F (36.4 C) (12/30 1110) Temp Source: Oral (12/30 1110) BP: 140/90 (12/30 1110) Pulse Rate: 80 (12/30 1110)  Labs: Recent Labs    12/24/23 0357 12/24/23 1100 12/24/23 2133 12/25/23 0445 12/25/23 1342  HGB 17.9*  --   --  17.5*  --   HCT 53.8*  --   --  53.1*  --   PLT 229  --   --  216  --   HEPARINUNFRC 0.77*   < > 0.42 0.20* 0.37   < > = values in this interval not displayed.    Estimated Creatinine Clearance: 135.7 mL/min (by C-G formula based on SCr of 0.86 mg/dL).   Medical History: Past Medical History:  Diagnosis Date   DDD (degenerative disc disease), lumbar    HNP (herniated nucleus pulposus)    Hypertension    Liver hemangioma    Morbid obesity (HCC)    OSA (obstructive sleep apnea)    no cpap used since weight loss   Overweight(278.02)    Plantar fasciitis of right foot    Sleep apnea    Tinea barbae       Assessment: 52 yo male with LLE DVT s/p craniotomy for tumor resection 12/18/23. Pharmacy consulted for transition to apixaban from heparin drip.  Heparin level this AM drifted down to subtherapeutic on 1050 units/hr  12/30 PM update HL 0.37 No signs of bleeding or issues with hep gtt  12/30 17:00 update: Now transitioning to apixaban, per Neuro will skip the load and start with 5 mg twice daily.  Goal of Therapy:  Monitor platelets by anticoagulation protocol: Yes   Plan:  Stop heparin drip Start apixaban 5 mg twice daily Monitor for signs/symptoms of bleeding   Romie Minus, PharmD PGY1 Pharmacy Resident  Please check AMION for all [facility] Pharmacy phone numbers After 10:00 PM, call Main Pharmacy [phone] 12/25/2023 5:04 PM

## 2023-12-25 NOTE — PMR Pre-admission (Signed)
 PMR Admission Coordinator Pre-Admission Assessment   Patient: Nicholas Stevens is an 52 y.o., male MRN: [id number] DOB: [birth year] Height: 6' 2 (188 cm) Weight: 120.6 kg   Insurance Information HMO:     PPO: no     PCP:      IPA:      80/20:      OTHER:  PRIMARY: [employer]      Policy#: [id number]       Subscriber: pt's spouse  CM Name: Olam      Phone#: [phone]     Fax#: [phone] Pre-Cert#: [id number]   Received insurance approval on 12/26/23 from [place]. Pt approved for 7 days from 12/26/23-01/02/24. Updates due on 01/01/23.   Employer:  Benefits:  Phone #: [email or web address]     Name:  Eff. Date: 12/26/22-12/26/23     Deduct: $500 ($500 met)      Out of Pocket Max: $2,000 ($2,000 met)      Life Max: NA CIR: 80% coverage, 20% co-insurance      SNF: 80% coverage, 20% co-insurance Outpatient: 80% coverage     Co-Pay: 20% co-insurance Home Health: 80% coverage      Co-Pay: 20% co-insurance DME: 80% coverage     Co-Pay: 20% co-insurance Providers: in-network SECONDARY:       Policy#:      Phone#:    Artist:       Phone#:    The Data Processing Manager" for patients in Inpatient Rehabilitation Facilities with attached "Privacy Act Statement-Health Care Records" was provided and verbally reviewed with: Patient and Family  Emergency Contact Information Contact Information     Name Relation Home Work Mobile   [place] Spouse [phone]  [phone]   Pole,Gilda Mother [phone]  [phone]      Other Contacts   None on File     Patient Admitting Diagnosis: brain mass   History of Present Illness: Pt is a 52 y/o male with PMH of obesity and OSA who was admitted to [facility] on 12/20 with decreased response time, L hemiparesis, 8/10 HA.  He did have a fall the day of admission without injury.  In ED he was hemodynamically stable.  Labs notable for Hgb 17.3, but otherwise not concerning.  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  Neurosurgery was consulted and recommended Decadron , metastatic workup, tight BP control, and surgical resection, which was performed by DDr. Debby on 12/23.  CT chest/abdomen/pelvis showed no concern for metastatic disease. Hospital course complicated by LLE DVT and pt was started on heparin  gtt per NS recommendations (no loading dose). Will plan to transition to oral AC week of 12/30. Therapy evaluations completed and pt was recommended for CIR.  Complete NIHSS TOTAL: 2  Patient's medical record from [facility] has been reviewed by the rehabilitation admission coordinator and physician.  Past Medical History  Past Medical History:  Diagnosis Date   DDD (degenerative disc disease), lumbar    HNP (herniated nucleus pulposus)    Hypertension    Liver hemangioma    Morbid obesity (HCC)    OSA (obstructive sleep apnea)    no cpap used since weight loss   Overweight(278.02)    Plantar fasciitis of right foot    Sleep apnea    Tinea barbae     Has the patient had major surgery during 100 days prior to admission? Yes  Family History   family history includes Hypertension in his father; Liver disease  in his father; Other in his mother.  Current Medications  Current Facility-Administered Medications:    acetaminophen  (TYLENOL ) tablet 650 mg, 650 mg, Oral, Q4H PRN, 650 mg at 12/19/23 1723 **OR** acetaminophen  (TYLENOL ) suppository 650 mg, 650 mg, Rectal, Q4H PRN, Johnanna Credit Caylin, PA-C   apixaban  (ELIQUIS ) tablet 5 mg, 5 mg, Oral, BID, Salam, Savannah B, RPH, 5 mg at 12/26/23 9162   bisacodyl  (DULCOLAX) suppository 10 mg, 10 mg, Rectal, Once, Agarwala, Fredia, MD   [COMPLETED] dexamethasone  (DECADRON ) tablet 4 mg, 4 mg, Oral, Q6H, 4 mg at 12/20/23 1316 **FOLLOWED BY** [COMPLETED] dexamethasone  (DECADRON ) tablet 4 mg, 4 mg, Oral, Q8H, 4 mg at 12/22/23 1418 **FOLLOWED BY** [COMPLETED] dexamethasone  (DECADRON ) tablet 4 mg, 4 mg,  Oral, Q12H, 4 mg at 12/24/23 0857 **FOLLOWED BY** [COMPLETED] dexamethasone  (DECADRON ) tablet 2 mg, 2 mg, Oral, Q12H, 2 mg at 12/26/23 0836 **FOLLOWED BY** [START ON 12/27/2023] dexamethasone  (DECADRON ) tablet 2 mg, 2 mg, Oral, Daily, Johnanna, Sara Caylin, PA-C   docusate sodium  (COLACE) capsule 100 mg, 100 mg, Oral, BID PRN, Hicks, Samantha B, NP   hydrALAZINE  (APRESOLINE ) injection 10 mg, 10 mg, Intravenous, Q4H PRN, Hicks, Samantha B, NP, 10 mg at 12/23/23 1711   HYDROcodone -acetaminophen  (NORCO/VICODIN) 5-325 MG per tablet 1 tablet, 1 tablet, Oral, Q4H PRN, Colon Shove, MD, 1 tablet at 12/21/23 1244   insulin  aspart (novoLOG ) injection 0-9 Units, 0-9 Units, Subcutaneous, TID WC, Hicks, Samantha B, NP, 1 Units at 12/25/23 1637   insulin  aspart (novoLOG ) injection 5 Units, 5 Units, Subcutaneous, TID WC, Agarwala, Ravi, MD, 5 Units at 12/26/23 0847   labetalol  (NORMODYNE ) injection 10-40 mg, 10-40 mg, Intravenous, Q10 min PRN, Johnanna Credit Caylin, PA-C, 20 mg at 12/19/23 1125   latanoprost  (XALATAN ) 0.005 % ophthalmic solution 1 drop, 1 drop, Both Eyes, QHS, Andrez Chroman, NP, 1 drop at 12/25/23 2157   levETIRAcetam  (KEPPRA ) tablet 500 mg, 500 mg, Oral, BID, Agarwala, Ravi, MD, 500 mg at 12/26/23 9163   ondansetron  (ZOFRAN ) tablet 4 mg, 4 mg, Oral, Q4H PRN **OR** ondansetron  (ZOFRAN ) injection 4 mg, 4 mg, Intravenous, Q4H PRN, Tomlinson, Sara Caylin, PA-C   Oral care mouth rinse, 15 mL, Mouth Rinse, PRN, Kassie Acquanetta Bradley, MD   pantoprazole  (PROTONIX ) EC tablet 40 mg, 40 mg, Oral, Daily, Agarwala, Ravi, MD, 40 mg at 12/26/23 9162   polyethylene glycol (MIRALAX  / GLYCOLAX ) packet 17 g, 17 g, Oral, BID, Agarwala, Ravi, MD   polyvinyl alcohol  (LIQUIFILM TEARS) 1.4 % ophthalmic solution 1 drop, 1 drop, Both Eyes, PRN, Chavez, Abigail, NP, 1 drop at 12/23/23 2352   promethazine  (PHENERGAN ) tablet 12.5-25 mg, 12.5-25 mg, Oral, Q4H PRN, Johnanna Credit Caylin, PA-C, 25 mg at 12/20/23 9068    senna-docusate (Senokot-S) tablet 1 tablet, 1 tablet, Oral, BID, Agarwala, Ravi, MD, 1 tablet at 12/26/23 9163   sodium phosphate  (FLEET) enema 1 enema, 1 enema, Rectal, Once PRN, Tomlinson, Sara Caylin, PA-C   zolpidem  (AMBIEN ) tablet 5 mg, 5 mg, Oral, QHS PRN, Agarwala, Ravi, MD, 5 mg at 12/25/23 2159  Patients Current Diet:  Diet Order             Diet Carb Modified Fluid consistency: Thin; Room service appropriate? Yes  Diet effective now                   Precautions / Restrictions Precautions Precautions: Fall Precaution Comments: s/p crani Restrictions Weight Bearing Restrictions Per Provider Order: No   Has the patient had 2 or more falls or a fall  with injury in the past year? No  Prior Activity Level Community (5-7x/wk): fully indpendent, no DME, driving, retired from patent examiner, very active, outdoorsy  Prior Functional Level Self Care: Did the patient need help bathing, dressing, using the toilet or eating? Independent  Indoor Mobility: Did the patient need assistance with walking from room to room (with or without device)? Independent  Stairs: Did the patient need assistance with internal or external stairs (with or without device)? Independent  Functional Cognition: Did the patient need help planning regular tasks such as shopping or remembering to take medications? Independent  Patient Information Are you of Hispanic, Latino/a,or Spanish origin?: A. No, not of Hispanic, Latino/a, or Spanish origin What is your race?: B. Black or African American Do you need or want an interpreter to communicate with a doctor or health care staff?: 0. No  Patient's Response To:  Health Literacy and Transportation Is the patient able to respond to health literacy and transportation needs?: Yes Health Literacy - How often do you need to have someone help you when you read instructions, pamphlets, or other written material from your doctor or pharmacy?: Never In the past  12 months, has lack of transportation kept you from medical appointments or from getting medications?: No In the past 12 months, has lack of transportation kept you from meetings, work, or from getting things needed for daily living?: No  Journalist, Newspaper / Equipment Home Equipment: Shower seat, Hand held shower head  Prior Device Use: Indicate devices/aids used by the patient prior to current illness, exacerbation or injury? None of the above  Current Functional Level Cognition  Overall Cognitive Status: Within Functional Limits for tasks assessed Current Attention Level: Selective Orientation Level: Oriented X4 Following Commands: Follows one step commands consistently Safety/Judgement: Decreased awareness of safety General Comments: occasional cue to attend to left, able to follow directions    Extremity Assessment (includes Sensation/Coordination)  Upper Extremity Assessment: Right hand dominant, LUE deficits/detail LUE Deficits / Details: pt noted to have scapula movement in all planes, shoulder shrug present, visual attention able to move L UE into elbow flexion ( bicep) decreased triceps. pt with digit flexion with decreased extension. AAROM for all elbow, wrist and digit movement LUE Sensation: decreased proprioception, decreased light touch LUE Coordination: decreased fine motor, decreased gross motor  Lower Extremity Assessment: Defer to PT evaluation LLE Deficits / Details: 2+/5 knee extension, 2/5 hip abd/add, 0/5 DF/PF LLE Sensation: decreased light touch LLE Coordination: decreased gross motor, decreased fine motor    ADLs  Overall ADL's : Needs assistance/impaired Eating/Feeding: Set up Grooming: Contact guard assist, Standing Upper Body Bathing: Maximal assistance, Sitting Lower Body Bathing: Maximal assistance, Sit to/from stand Upper Body Dressing : Minimal assistance, Standing Upper Body Dressing Details (indicate cue type and reason): gown for back Lower  Body Dressing: Moderate assistance, Sitting/lateral leans Lower Body Dressing Details (indicate cue type and reason): assistance for donning shoes seated on EOB Toilet Transfer: Minimal assistance, Contact guard assist, BSC/3in1 General ADL Comments: performed toilet transfers and self care in bathroom with min to CGA    Mobility  Overal bed mobility: Needs Assistance Bed Mobility: Supine to Sit Rolling: Mod assist Sidelying to sit: Min assist Supine to sit: Min assist General bed mobility comments: increased time and assistance to scoot to EOB    Transfers  Overall transfer level: Needs assistance Equipment used: 1 person hand held assist Transfers: Sit to/from Stand Sit to Stand: Min assist, Contact guard assist Bed to/from chair/wheelchair/BSC  transfer type:: Stand pivot Stand pivot transfers: Mod assist, +2 physical assistance Transfer via Lift Equipment: Stedy General transfer comment: min to CGA for sit to stands and balance for transfers, cues for hand placement    Ambulation / Gait / Stairs / Wheelchair Mobility  Ambulation/Gait Ambulation/Gait assistance: Editor, Commissioning (Feet): 120 Feet Assistive device: 1 person hand held assist Gait Pattern/deviations: Step-through pattern General Gait Details: initial incoordination on L LE improved with cuing to weak heel toe pattern, some drift and instability with scanning.  L calf pain much improved and less disruptive to gait. Gait velocity: decr Gait velocity interpretation: <1.8 ft/sec, indicate of risk for recurrent falls Pre-gait activities: worked on w/shifts, unweighting for sidestepping, turning 360 deg min mod assist.  Notable more complex tasks throwing off coordination L side.    Posture / Balance Dynamic Sitting Balance Sitting balance - Comments: able to assist with donning shoes seated on EOB Balance Overall balance assessment: Needs assistance Sitting-balance support: Single extremity supported, Feet  supported Sitting balance-Leahy Scale: Fair Sitting balance - Comments: able to assist with donning shoes seated on EOB Postural control: Left lateral lean Standing balance support: Single extremity supported, During functional activity Standing balance-Leahy Scale: Fair Standing balance comment: able to stand at sink for hand hygiene with no UE support    Special needs/care consideration CPAP and Skin surgical incision/crani   Previous Home Environment (from acute therapy documentation) Living Arrangements: Spouse/significant other Available Help at Discharge: Family, Available 24 hours/day Type of Home: House Home Layout: Two level, Able to live on main level with bedroom/bathroom Alternate Level Stairs-Rails: Left Alternate Level Stairs-Number of Steps: 14 Home Access: Level entry Bathroom Shower/Tub: Health Visitor: Handicapped height Bathroom Accessibility: Yes How Accessible: Accessible via walker Home Care Services: No Additional Comments: goes by DJ, 5 grandbabies  Discharge Living Setting Plans for Discharge Living Setting: Patient's home, Lives with (comment) (spouse) Type of Home at Discharge: House Discharge Home Layout: Able to live on main level with bedroom/bathroom Discharge Home Access: Level entry Discharge Bathroom Shower/Tub: Walk-in shower Discharge Bathroom Toilet: Handicapped height Discharge Bathroom Accessibility: Yes How Accessible: Accessible via walker Does the patient have any problems obtaining your medications?: No  Social/Family/Support Systems Anticipated Caregiver: Adonna (spouse) Anticipated Caregiver's Contact Information: [phone] Ability/Limitations of Caregiver: none stated Caregiver Availability: 24/7 Discharge Plan Discussed with Primary Caregiver: Yes Is Caregiver In Agreement with Plan?: Yes Does Caregiver/Family have Issues with Lodging/Transportation while Pt is in Rehab?: No  Goals Patient/Family Goal for  Rehab: PT/OT/SLP supervision Expected length of stay: 10-14 days Additional Information: Discharge plan: home with spoues to provide 24/7 Pt/Family Agrees to Admission and willing to participate: Yes Program Orientation Provided & Reviewed with Pt/Caregiver Including Roles  & Responsibilities: Yes  Decrease burden of Care through IP rehab admission: n/a  Possible need for SNF placement upon discharge: not anticipated. Plan for discharge home with spouse 24/7.   Patient Condition:  I have reviewed medical records from [facility], spoken with CM, and patient and spouse. I discussed via phone for inpatient rehabilitation assessment.  Patient will benefit from ongoing PT, OT, and SLP, can actively participate in 3 hours of therapy a day 5 days of the week, and can make measurable gains during the admission.  Patient will also benefit from the coordinated team approach during an Inpatient Acute Rehabilitation admission.  The patient will receive intensive therapy as well as Rehabilitation physician, nursing, social worker, and care management interventions.  Due to safety,  skin/wound care, disease management, medication administration, pain management, and patient education the patient requires 24 hour a day rehabilitation nursing.  The patient is currently min to mod assist with mobility and basic ADLs.  Discharge setting and therapy post discharge at  home  is anticipated.  Patient has agreed to participate in the Acute Inpatient Rehabilitation Program and will admit today.    Preadmission Screen Completed By:  Reche FORBES Lowers, PT, DPT 12/26/2023 11:16 AM ______________________________________________________________________   Discussed status with Dr. Vicky Schleich on 12/26/23 at 11:16 AM  and received approval for admission today.  Admission Coordinator:  Caitlin E Warren, PT, DPT time 11:16 AM Pattricia 12/26/23    Assessment/Plan: Diagnosis:  R brain mass s/p crani Does the need for close, 24 hr/day  Medical supervision in concert with the patient's rehab needs make it unreasonable for this patient to be served in a less intensive setting? Yes Co-Morbidities requiring supervision/potential complications: L hemiparesis; DVT on Elqiuis;  Due to bladder management, bowel management, safety, skin/wound care, disease management, medication administration, pain management, and patient education, does the patient require 24 hr/day rehab nursing? Yes Does the patient require coordinated care of a physician, rehab nurse, PT, OT, and SLP to address physical and functional deficits in the context of the above medical diagnosis(es)? Yes Addressing deficits in the following areas: balance, endurance, locomotion, strength, transferring, bowel/bladder control, bathing, dressing, feeding, grooming, toileting, cognition, speech, and language Can the patient actively participate in an intensive therapy program of at least 3 hrs of therapy 5 days a week? Yes The potential for patient to make measurable gains while on inpatient rehab is good Anticipated functional outcomes upon discharge from inpatient rehab: supervision PT, supervision OT, supervision SLP Estimated rehab length of stay to reach the above functional goals is: 10-14 days Anticipated discharge destination: Home 10. Overall Rehab/Functional Prognosis: good   MD Signature:

## 2023-12-25 NOTE — Progress Notes (Signed)
Inpatient Rehab Admissions Coordinator:   Left message for pt spouse to discuss transition to CIR today.  Noted heparin gtt started over the weekend, which we manage on CIR.  NS NP over the weekend states transition to oral AC this week and we would consult with them on timing of that.  NS can see while on CIR.  Await call back.  I do have a bed available for this patient to admit today if pt/family agreeable.    Estill Dooms, PT, DPT Admissions Coordinator [phone] 12/25/23  3:39 PM

## 2023-12-25 NOTE — Progress Notes (Signed)
Physical Therapy Treatment Patient Details Name: RODERICH COCKCROFT MRN: [id number] DOB: 1971-06-05 Today's Date: 12/25/2023   History of Present Illness 52 yo male presenting 12/20 with decreased response time while driving, incoordination, imbalance. Imaging showed necrotic and hemorrhagic mass in the superior right frontal lobe with features of glioblastoma with mass effect. S/p R craniotomy 12/23. PMH includes: obesity and OSA.    PT Comments  Good improvements made.  Generally min assist level with incoordination of L side L UE significantly worse than L LE.  Emphasis on ramping up complexity of pre-gait tasks, completing balance tasks EOB with donning shoes.  Progression of gait stability/pattern quality.     If plan is discharge home, recommend the following: A little help with walking and/or transfers;A little help with bathing/dressing/bathroom;Assistance with cooking/housework;Assist for transportation   Can travel by private vehicle        Equipment Recommendations  Other (comment) (TBD post AIR)    Recommendations for Other Services Rehab consult     Precautions / Restrictions Precautions Precautions: Fall Precaution Comments: s/p crani     Mobility  Bed Mobility Overal bed mobility: Needs Assistance Bed Mobility: Supine to Sit   Sidelying to sit: Min assist       General bed mobility comments: slow with incoordination L side, but only assist when pt started listing posteriorly when moving L LE toward EOB    Transfers Overall transfer level: Needs assistance Equipment used: 1 person hand held assist Transfers: Sit to/from Stand (x 5) Sit to Stand: Min assist, Contact guard assist           General transfer comment: improved STS with cues to come more forward, Use of bil UE's to come more forward.    Ambulation/Gait Ambulation/Gait assistance: Min assist Gait Distance (Feet): 120 Feet Assistive device: 1 person hand held assist Gait  Pattern/deviations: Step-through pattern   Gait velocity interpretation: <1.8 ft/sec, indicate of risk for recurrent falls Pre-gait activities: worked on w/shifts, unweighting for sidestepping, turning 360 deg min mod assist.  Notable more complex tasks throwing off coordination L side. General Gait Details: initial incoordination on L LE improved with cuing to weak heel toe pattern, some drift and instability with scanning.  L calf pain much improved and less disruptive to gait.   Stairs             Wheelchair Mobility     Tilt Bed    Modified Rankin (Stroke Patients Only)       Balance     Sitting balance-Leahy Scale: Fair       Standing balance-Leahy Scale: Fair Standing balance comment: pt able to stand static without external support today with eyes open.                            Cognition Arousal: Alert Behavior During Therapy: WFL for tasks assessed/performed Overall Cognitive Status: Within Functional Limits for tasks assessed                                          Exercises      General Comments General comments (skin integrity, edema, etc.): vss      Pertinent Vitals/Pain Pain Assessment Pain Assessment: Faces Faces Pain Scale: Hurts a little bit Pain Location: L calf Pain Descriptors / Indicators: Sore Pain Intervention(s): Monitored during session  Home Living                          Prior Function            PT Goals (current goals can now be found in the care plan section) Acute Rehab PT Goals PT Goal Formulation: With patient/family Time For Goal Achievement: 01/02/24 Potential to Achieve Goals: Good Progress towards PT goals: Progressing toward goals    Frequency    Min 1X/week      PT Plan      Co-evaluation              AM-PAC PT "6 Clicks" Mobility   Outcome Measure  Help needed turning from your back to your side while in a flat bed without using bedrails?: A  Little Help needed moving from lying on your back to sitting on the side of a flat bed without using bedrails?: A Little Help needed moving to and from a bed to a chair (including a wheelchair)?: A Little Help needed standing up from a chair using your arms (e.g., wheelchair or bedside chair)?: A Little Help needed to walk in hospital room?: A Little Help needed climbing 3-5 steps with a railing? : A Lot 6 Click Score: 17    End of Session   Activity Tolerance: Patient tolerated treatment well Patient left: in chair;with call bell/phone within reach;with family/visitor present Nurse Communication: Mobility status PT Visit Diagnosis: Unsteadiness on feet (R26.81);Other abnormalities of gait and mobility (R26.89) Hemiplegia - Right/Left: Left Hemiplegia - dominant/non-dominant: Non-dominant Hemiplegia - caused by: Unspecified Pain - part of body:  (calf)     Time: 2956-2130 PT Time Calculation (min) (ACUTE ONLY): 29 min  Charges:    $Gait Training: 8-22 mins $Neuromuscular Re-education: 8-22 mins PT General Charges $$ ACUTE PT VISIT: 1 Visit                     12/25/2023  Jacinto Halim., PT Acute Rehabilitation Services [phone]  (office)   Eliseo Gum Rashan Patient 12/25/2023, 11:19 AM

## 2023-12-25 NOTE — Discharge Summary (Signed)
 Physician Discharge Summary  Nicholas Stevens FMW:[id number] DOB: 1971/11/20 DOA: 12/15/2023  PCP: Garald Karlynn GAILS, MD  Admit date: 12/15/2023 Discharge date: 12/26/2023  Admitted From: home Disposition:  CIR  Recommendations for Outpatient Follow-up:  Follow up with PCP in 1-2 weeks  Home Health: none Equipment/Devices: none  Discharge Condition: stable CODE STATUS: Full code Diet Orders (From admission, onward)     Start     Ordered   12/18/23 1934  Diet Carb Modified Fluid consistency: Thin; Room service appropriate? Yes  Diet effective now       Question Answer Comment  Diet-HS Snack? Nothing   Calorie Level Medium 1600-2000   Fluid consistency: Thin   Room service appropriate? Yes      12/18/23 1934            HPI:  52 year old male with known OSA admitted with right frontal mass with hemorrhage. Wife at bedside and assisting with history. Patient noticed a decrease in response time while driving yesterday and new onset episodes of decrease use of left side. This worsened this morning, with loss of coordination, including where he lost his balance and fell against the wall, denies hitting head or body, prompting to seek further medical treatment. CODE stroke imaging was obtained due to symptoms and revealed a partially hemorrhagic lesion in the superior right frontal lobe with local mass effect. 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 Status post craniotomy/tumor resection 12/18/23 without complication.   Hospital Course / Discharge diagnoses: Principal Problem R frontal lobe hemorrhagic mass w/ brain compression - s/p tumor resection 12/18/23, has been placed on Keppra  500 mg twice daily as well as a dexamethasone  taper.  Therapy recommended CIR and will be discharged in stable and improved condition.  Active  problems   RLE  pain/swelling - Vascular US  showing DVT.  Will stable on heparin , discussed with neurosurgery, he was transitioned to Eliquis .  Continue OSA - CPAP @ night time  Steroid induced hyperglycemia -continue sliding scale in inpatient rehab, I anticipate that once the steroids will be weaned off he would not need further insulin   Sepsis ruled out   Discharge Instructions   Allergies as of 12/26/2023   No Known Allergies      Medication List     STOP taking these medications    Adapalene -Benzoyl Peroxide  0.1-2.5 % gel Commonly known as: Epiduo    Advil Liqui-Gels minis 200 MG Caps Generic drug: Ibuprofen   doxycycline  100 MG tablet Commonly known as: VIBRA -TABS   GINGER PO   tadalafil  20 MG tablet Commonly known as: CIALIS    triamterene -hydrochlorothiazide  37.5-25 MG capsule Commonly known as: DYAZIDE    TURMERIC PO   Wegovy  0.5 MG/0.5ML Soaj Generic drug: Semaglutide -Weight Management       TAKE these medications    apixaban  5 MG Tabs tablet Commonly known as: ELIQUIS  Take 1 tablet (5 mg total) by mouth 2 (two) times daily.   B-12 PO Take 1 tablet by mouth daily.   dexamethasone  2 MG tablet Commonly known as: DECADRON  Take 2 tablets (4 mg total) by mouth every 12 (twelve) hours for 1 day, THEN 1 tablet (2 mg total) every 12 (twelve) hours for 2 days, THEN 1 tablet (2 mg total) daily for 2 days. Start taking on: December 23, 2023   Fish Oil 1000 MG Caps Take 1,000 mg by  mouth daily.   latanoprost  0.005 % ophthalmic solution Commonly known as: XALATAN  Place 1 drop into both eyes at bedtime.   levETIRAcetam  500 MG tablet Commonly known as: KEPPRA  Take 1 tablet (500 mg total) by mouth 2 (two) times daily.   multivitamin with minerals Tabs tablet Take 1 tablet by mouth daily.   pimecrolimus  1 % cream Commonly known as: Elidel  Apply topically 2 (two) times daily. What changed:  how much to take when to take this reasons to take this    Vitamin D3 50 MCG (2000 UT) capsule Take 1 capsule (2,000 Units total) daily by mouth.   zolpidem  10 MG tablet Commonly known as: AMBIEN  TAKE 1 TABLET BY MOUTH EVERYDAY AT BEDTIME       Consultations: Neurosurgery   Procedures/Studies:  CT HEAD WO CONTRAST ( ) Result Date: 12/26/2023 CLINICAL DATA:  Brain/CNS neoplasm, monitor. EXAM: CT HEAD WITHOUT CONTRAST TECHNIQUE: Contiguous axial images were obtained from the base of the skull through the vertex without intravenous contrast. RADIATION DOSE REDUCTION: This exam was performed according to the departmental dose-optimization program which includes automated exposure control, adjustment of the mA and/or kV according to patient size and/or use of iterative reconstruction technique. COMPARISON:  Head CT 12/15/2023.  MRI brain 12/19/2023. FINDINGS: Brain: Postoperative changes of right frontal craniotomy for resection of a mass centered within the right superior frontal gyrus. Expected small amount of blood products in the resection cavity. Similar extent of surrounding vasogenic edema with partial effacement of the right lateral ventricle. No hydrocephalus, extra-axial collection or midline shift. Vascular: No hyperdense vessel or unexpected calcification. Skull: Right frontal craniotomy. Sinuses/Orbits: No acute finding. Other: None. IMPRESSION: Postoperative changes of right frontal craniotomy for resection of a mass centered within the right superior frontal gyrus. Expected small amount of blood products in the resection cavity. Similar extent of surrounding vasogenic edema with partial effacement of the right lateral ventricle. Electronically Signed   By: Ryan Chess M.D.   On: 12/26/2023 09:13   VAS US  LOWER EXTREMITY VENOUS (DVT) Result Date: 12/24/2023  Lower Venous DVT Study Patient Name:  Nicholas Stevens  Date of Exam:   12/23/2023 Medical Rec #: [id number]            Accession #:    [id number] Date of Birth: 09-Oct-1971            Patient Gender: M Patient Age:   60 years Exam Location:  [facility] Procedure:      VAS US  LOWER EXTREMITY VENOUS (DVT) Referring Phys: [facility] BERGMAN --------------------------------------------------------------------------------  Indications: Pain, and Inability to walk due to pain.  Comparison Study: No prior exam. Performing Technologist: Edilia Elden Appl  Examination Guidelines: A complete evaluation includes B-mode imaging, spectral Doppler, color Doppler, and power Doppler as needed of all accessible portions of each vessel. Bilateral testing is considered an integral part of a complete examination. Limited examinations for reoccurring indications may be performed as noted. The reflux portion of the exam is performed with the patient in reverse Trendelenburg.  +-----+---------------+---------+-----------+----------+--------------+ RIGHTCompressibilityPhasicitySpontaneityPropertiesThrombus Aging +-----+---------------+---------+-----------+----------+--------------+ CFV  Full           Yes      Yes                                 +-----+---------------+---------+-----------+----------+--------------+ SFJ  Full           Yes      Yes                                 +-----+---------------+---------+-----------+----------+--------------+   +---------+---------------+---------+-----------+----------+--------------+  LEFT     CompressibilityPhasicitySpontaneityPropertiesThrombus Aging +---------+---------------+---------+-----------+----------+--------------+ CFV      Full           Yes      Yes                                 +---------+---------------+---------+-----------+----------+--------------+ SFJ      Full           Yes      Yes                                 +---------+---------------+---------+-----------+----------+--------------+ FV Prox  Full                                                         +---------+---------------+---------+-----------+----------+--------------+ FV Mid   Full                                                        +---------+---------------+---------+-----------+----------+--------------+ FV DistalFull                                                        +---------+---------------+---------+-----------+----------+--------------+ PFV      Full                                                        +---------+---------------+---------+-----------+----------+--------------+ POP      Full           Yes      Yes                                 +---------+---------------+---------+-----------+----------+--------------+ PTV      Full                                                        +---------+---------------+---------+-----------+----------+--------------+ PERO     None           No       No                                  +---------+---------------+---------+-----------+----------+--------------+     Summary: RIGHT: - No evidence of common femoral vein obstruction.   LEFT: - Findings consistent with acute deep vein thrombosis involving the left peroneal veins.  - No cystic structure found in the popliteal fossa.  *See table(s) above for measurements and observations. Electronically signed by Debby Robertson on 12/24/2023 at 12:31:39  PM.    Final    MR BRAIN W WO CONTRAST Result Date: 12/19/2023 CLINICAL DATA:  Follow-up craniotomy. EXAM: MRI HEAD WITHOUT AND WITH CONTRAST TECHNIQUE: Multiplanar, multiecho pulse sequences of the brain and surrounding structures were obtained without and with intravenous contrast. CONTRAST:  10 mL GADAVIST  GADOBUTROL  1 MMOL/ML IV SOLN COMPARISON:  Preoperative imaging 12/16/2023 FINDINGS: Brain: Status post right frontal craniotomy for debulking of a previously seen 5.6 cm hemorrhagic and necrotic mass at the right posterior frontal vertex. Good debulking with collapse of the lesion, the region enhancement  around the margins now measuring 3.2 cm cephalo caudal, 2.8 cm front to back and 2.0 cm right to left. Small amount of air or blood products in the central portion. Persistent regional edema as would be expected. Less mass effect. Right-to-left shift now measuring 5 mm. Mass-effect upon the frontal horn of the right lateral ventricle. Other nearby error is of tumor infiltration including the frontal operculum, basal ganglia and cingulate gyrus persist without contrast enhancement in those regions. No hydrocephalus. No extra-axial collection. Vascular: Major vessels at the base of the brain show flow. Skull and upper cervical spine: Negative Sinuses/Orbits: Clear/normal Other: None IMPRESSION: 1. Good debulking of a previously seen 5.6 cm hemorrhagic and necrotic mass at the right posterior frontal vertex. Small amount of air or blood products in the central portion of the lesion. Collapse of the lesion with a region of enhancement now measuring 3.2 x 2.8 x 2.0 cm. Persistent regional edema as would be expected. Less mass effect. Right-to-left shift now measuring 5 mm. Mass-effect upon the frontal horn of the right lateral ventricle. 2. Other nearby areas of tumor infiltration including the frontal operculum, basal ganglia and cingulate gyrus persist without contrast enhancement in those regions. Electronically Signed   By: Oneil Officer M.D.   On: 12/19/2023 08:58   MR BRAIN W WO CONTRAST Result Date: 12/16/2023 CLINICAL DATA:  Brain neoplasm staging EXAM: MRI HEAD WITHOUT AND WITH CONTRAST TECHNIQUE: Multiplanar, multiecho pulse sequences of the brain and surrounding structures were obtained without and with intravenous contrast. CONTRAST:  10mL GADAVIST  GADOBUTROL  1 MMOL/ML IV SOLN COMPARISON:  12/15/2023 brain MRI FINDINGS: An abbreviated protocol was performed, consisting of axial, sagittal and coronal T1-weighted images and axial FLAIR sequence. Brain: Unchanged size of large mass centered in the right  frontal lobe, measuring 4.7 x 3.8 by 4.8 cm. There is a large amount of surrounding vasogenic edema. 8 mm leftward midline shift. This study was performed for stereotactic localization. IMPRESSION: Unchanged size of large mass centered in the right frontal lobe, measuring 4.7 x 3.8 by 4.8 cm. Large amount of surrounding vasogenic edema. 8 mm leftward midline shift. Electronically Signed   By: Franky Stanford M.D.   On: 12/16/2023 20:44   CT CHEST ABDOMEN PELVIS W CONTRAST Result Date: 12/16/2023 CLINICAL DATA:  CNS neoplasm.  Staging study.  * Tracking Code: BO * EXAM: CT CHEST, ABDOMEN, AND PELVIS WITH CONTRAST TECHNIQUE: Multidetector CT imaging of the chest, abdomen and pelvis was performed following the standard protocol during bolus administration of intravenous contrast. RADIATION DOSE REDUCTION: This exam was performed according to the departmental dose-optimization program which includes automated exposure control, adjustment of the mA and/or kV according to patient size and/or use of iterative reconstruction technique. CONTRAST:  75mL OMNIPAQUE  IOHEXOL  350 MG/ML SOLN COMPARISON:  Chest CTA 03/11/2023. FINDINGS: CT CHEST FINDINGS Cardiovascular: The heart size is normal. No substantial pericardial effusion. No thoracic aortic aneurysm. No substantial atherosclerosis of the  thoracic aorta. Mediastinum/Nodes: No mediastinal lymphadenopathy. There is no hilar lymphadenopathy. The esophagus has normal imaging features. There is no axillary lymphadenopathy. Lungs/Pleura: Subsegmental atelectasis noted posterior right lung base. No suspicious pulmonary nodule or mass. No focal airspace consolidation. No pleural effusion. Musculoskeletal: No worrisome lytic or sclerotic osseous abnormality. CT ABDOMEN PELVIS FINDINGS Hepatobiliary: Jen hemangioma identified right hepatic lobe with exophytic hemangioma posterior left hepatic lobe. These were characterized by MRI back in 2014 and are similar in the interval.  Gallbladder is decompressed. No intrahepatic or extrahepatic biliary dilation. Pancreas: No focal mass lesion. No dilatation of the main duct. No intraparenchymal cyst. No peripancreatic edema. Spleen: No splenomegaly. No suspicious focal mass lesion. Adrenals/Urinary Tract: No adrenal nodule or mass. Kidneys unremarkable. No evidence for hydroureter. Bladder is decompressed by Foley catheter. Stomach/Bowel: Surgical changes are noted in the stomach, compatible with sleeve gastrectomy. Duodenum is normally positioned as is the ligament of Treitz. No small bowel wall thickening. No small bowel dilatation. The terminal ileum is normal. Nonvisualization of the appendix is consistent with the reported history of appendectomy. No gross colonic mass. No colonic wall thickening. Vascular/Lymphatic: There is mild atherosclerotic calcification of the abdominal aorta without aneurysm. There is no gastrohepatic or hepatoduodenal ligament lymphadenopathy. No retroperitoneal or mesenteric lymphadenopathy. No pelvic sidewall lymphadenopathy. Reproductive: Prostate gland upper normal for size. Other: No intraperitoneal free fluid. Musculoskeletal: No worrisome lytic or sclerotic osseous abnormality. IMPRESSION: 1. No evidence for metastatic disease in the chest, abdomen, or pelvis. 2. Surgical changes of sleeve gastrectomy. 3. Giant right cavernous hemangioma with exophytic left hepatic lobe hemangioma. These were characterized by and are similar to abdominal MRI 11/10/2013. 4.  Aortic Atherosclerosis (ICD10-I70.0). Electronically Signed   By: Camellia Candle M.D.   On: 12/16/2023 19:20   CT HEAD WO CONTRAST ( ) Result Date: 12/15/2023 CLINICAL DATA:  Mental status change, persistent or worsening EXAM: CT HEAD WITHOUT CONTRAST TECHNIQUE: Contiguous axial images were obtained from the base of the skull through the vertex without intravenous contrast. RADIATION DOSE REDUCTION: This exam was performed according to the  departmental dose-optimization program which includes automated exposure control, adjustment of the mA and/or kV according to patient size and/or use of iterative reconstruction technique. COMPARISON:  CT head and MRI head from earlier today. FINDINGS: Brain: In comparison to same day CT head, new/increased 1.7 cm focus of hyperdense hemorrhage along the inferior aspect of the hemorrhagic mass (series 3, image 24; series 5, image 47 in the right frontal lobe with similar mass effect and surrounding edema. No hydrocephalus. No evidence of acute large vascular territory infarct. Vascular: No hyperdense vessel. Skull: No acute fracture. Sinuses/Orbits: Mild paranasal sinus mucosal thickening. No acute orbital findings. IMPRESSION: In comparison to same day CT head, new/increased 1.7 cm focus of hyperdense hemorrhage along the inferior aspect of the hemorrhagic mass in the right frontal lobe with similar mass effect and surrounding edema. The mass is further characterized on same day MRI. Electronically Signed   By: Gilmore GORMAN Molt M.D.   On: 12/15/2023 15:41   MR Brain W and Wo Contrast Result Date: 12/15/2023 CLINICAL DATA:  Headache. EXAM: MRI HEAD WITHOUT AND WITH CONTRAST TECHNIQUE: Multiplanar, multiecho pulse sequences of the brain and surrounding structures were obtained without and with intravenous contrast. CONTRAST:  10mL GADAVIST  GADOBUTROL  1 MMOL/ML IV SOLN COMPARISON:  Head CT from earlier today FINDINGS: Brain: Known mass at the superior right frontal lobe with heterogeneous enhancement and a dominant rim enhancing cavity with irregular margination and internal blood  products, the enhancing area in total measures up to 4.5 cm craniocaudal. There is a rim of T2 hyperintensity and expansion with contiguous infiltrative cortical and white matter T2 signal in the adjacent right frontal lobe that spans both ACA and MCA distributions. This nonenhancing infiltrative appearance is most suggestive of  underlying glioma rather than solitary metastasis. There is local mass effect with very early right uncal herniation on coronal T2 weighted imaging. No mass seen in this area on 2016 head CT. Vascular: Major flow voids and vascular enhancements are preserved Skull and upper cervical spine: Normal marrow signal Sinuses/Orbits: Negative IMPRESSION: Necrotic and hemorrhagic mass in the superior right frontal lobe with regional infiltrative signal abnormality, features of glioblastoma. Mass effect causes very early right uncal herniation on coronal images. Electronically Signed   By: Dorn Roulette M.D.   On: 12/15/2023 08:45   DG Chest Portable 1 View Result Date: 12/15/2023 CLINICAL DATA:  Evaluate for weakness. EXAM: PORTABLE CHEST 1 VIEW COMPARISON:  03/12/2023 FINDINGS: There is asymmetric elevation of the right hemidiaphragm. Normal cardiomediastinal contours. No pleural fluid, interstitial edema or airspace consolidation. Visualized osseous structures are unremarkable. IMPRESSION: 1. No acute cardiopulmonary disease. 2. Asymmetric elevation of the right hemidiaphragm. Electronically Signed   By: Waddell Calk M.D.   On: 12/15/2023 06:13   CT ANGIO HEAD NECK W WO CM Result Date: 12/15/2023 CLINICAL DATA:  Neuro deficit with stroke suspected. EXAM: CT ANGIOGRAPHY HEAD AND NECK WITH AND WITHOUT CONTRAST TECHNIQUE: Multidetector CT imaging of the head and neck was performed using the standard protocol during bolus administration of intravenous contrast. Multiplanar CT image reconstructions and MIPs were obtained to evaluate the vascular anatomy. Carotid stenosis measurements (when applicable) are obtained utilizing NASCET criteria, using the distal internal carotid diameter as the denominator. RADIATION DOSE REDUCTION: This exam was performed according to the departmental dose-optimization program which includes automated exposure control, adjustment of the mA and/or kV according to patient size and/or use  of iterative reconstruction technique. CONTRAST:  75mL OMNIPAQUE  IOHEXOL  350 MG/ML SOLN COMPARISON:  Head CT from earlier today. FINDINGS: CTA NECK FINDINGS Aortic arch: Unremarkable Right carotid system: Accounting for motion artifact vessels are smoothly contoured and widely patent with mild atheromatous plaque at the bifurcation. Left carotid system: Accounting for motion artifact vessels are smoothly contoured and diffusely patent with minimal atheromatous plaque at the bifurcation. Vertebral arteries: No proximal subclavian or vertebral stenosis. No evidence of beading or dissection. Skeleton: Posterior longitudinal ligament ossification to a mild degree at mid and lower cervical levels, which could contact the ventral cord. Other neck: No acute or aggressive finding Upper chest: Clear apical lungs. Review of the MIP images confirms the above findings CTA HEAD FINDINGS Anterior circulation: Hemorrhagic lesion at the superior right frontal lobe shows no spot sign or underlying aneurysm/vascular malformation. No major branch occlusion, beading, or flow reducing stenosis. Posterior circulation: The vertebral and basilar arteries are smoothly contoured and diffusely patent. No branch occlusion, beading, or aneurysm. Venous sinuses: Diffusely patent, including at the superior sagittal sinus. Anatomic variants: None significant Review of the MIP images confirms the above findings IMPRESSION: 1. No vascular lesion or spot sign seen at the hemorrhagic right superior frontal lesion. 2. Mild atherosclerosis. 3. Intermittent motion artifact especially affecting the neck. Electronically Signed   By: Dorn Roulette M.D.   On: 12/15/2023 06:08   CT HEAD WO CONTRAST Result Date: 12/15/2023 CLINICAL DATA:  Neuro deficit with acute stroke suspected EXAM: CT HEAD WITHOUT CONTRAST TECHNIQUE: Contiguous axial  images were obtained from the base of the skull through the vertex without intravenous contrast. RADIATION DOSE  REDUCTION: This exam was performed according to the departmental dose-optimization program which includes automated exposure control, adjustment of the mA and/or kV according to patient size and/or use of iterative reconstruction technique. COMPARISON:  08/04/2015 FINDINGS: Brain: Low-density area in the superior right frontal lobe with iso and hyperdense center. The hemorrhagic area measures up to 2.7 x 1.7 cm. Prominent degree of adjacent low-density with isodense and partially cystic center which may favors underlying mass. No hyperdensity in the adjacent superior sagittal sinus. There is local mass effect and leftward midline shift without herniation. No hydrocephalus or extra-axial collection. Vascular: No hyperdense vessel or unexpected calcification. Skull: Normal. Negative for fracture or focal lesion. Sinuses/Orbits: No acute finding Case discussed in epic chat, hemorrhage is already known to provider. IMPRESSION: Partially hemorrhagic lesion in the superior right frontal lobe with local mass effect. Suspect underlying mass, recommend brain MRI with contrast. Electronically Signed   By: Dorn Roulette M.D.   On: 12/15/2023 06:00     Subjective: - no chest pain, shortness of breath, no abdominal pain, nausea or vomiting.   Discharge Exam: BP 120/79 (BP Location: Left Arm)   Pulse 62   Temp 97.9 F (36.6 C) (Oral)   Resp 18   Ht 6' 2 (1.88 m)   Wt 121 kg   SpO2 93%   BMI 34.25 kg/m   General: Pt is alert, awake, not in acute distress Cardiovascular: RRR, S1/S2 +, no rubs, no gallops Respiratory: CTA bilaterally, no wheezing, no rhonchi Abdominal: Soft, NT, ND, bowel sounds + Extremities: no edema, no cyanosis   The results of significant diagnostics from this hospitalization (including imaging, microbiology, ancillary and laboratory) are listed below for reference.     Microbiology: Recent Results (from the past 240 hours)  Surgical PCR screen     Status: None   Collection  Time: 12/17/23  9:29 PM   Specimen: Nasal Mucosa; Nasal Swab  Result Value Ref Range Status   MRSA, PCR NEGATIVE NEGATIVE Final   Staphylococcus aureus NEGATIVE NEGATIVE Final    Comment: (NOTE) The Xpert SA Assay (FDA approved for NASAL specimens in patients 60 years of age and older), is one component of a comprehensive surveillance program. It is not intended to diagnose infection nor to guide or monitor treatment. Performed at [facility], [address] [place], [place] [address]      Labs: Basic Metabolic Panel: Recent Labs  Lab 12/19/23 1323 12/20/23 2327  NA 138 139  K 3.6 4.5  CL 106 107  CO2 21* 22  GLUCOSE 130* 105*  BUN 18 18  CREATININE 0.94 0.86  CALCIUM 8.4* 8.6*  MG  --  2.4   Liver Function Tests: No results for input(s): AST, ALT, ALKPHOS, BILITOT, PROT, ALBUMIN in the last 168 hours. CBC: Recent Labs  Lab 12/19/23 1323 12/24/23 0357 12/25/23 0445 12/26/23 0641  WBC 10.0 12.7* 12.1* 11.4*  NEUTROABS 8.3*  --   --   --   HGB 17.7* 17.9* 17.5* 17.5*  HCT 54.5* 53.8* 53.1* 53.3*  MCV 83.3 81.6 82.3 82.1  PLT 280 229 216 214   CBG: Recent Labs  Lab 12/25/23 0750 12/25/23 1109 12/25/23 1629 12/25/23 2120 12/26/23 0757  GLUCAP 132* 105* 147* 198* 108*   Hgb A1c No results for input(s): HGBA1C in the last 72 hours. Lipid Profile No results for input(s): CHOL, HDL, LDLCALC, TRIG,  CHOLHDL, LDLDIRECT in the last 72 hours. Thyroid  function studies No results for input(s): TSH, T4TOTAL, T3FREE, THYROIDAB in the last 72 hours.  Invalid input(s): FREET3 Urinalysis    Component Value Date/Time   COLORURINE YELLOW 12/15/2023 0606   APPEARANCEUR CLEAR 12/15/2023 0606   LABSPEC 1.019 12/15/2023 0606   PHURINE 7.5 12/15/2023 0606   GLUCOSEU NEGATIVE 12/15/2023 0606   GLUCOSEU NEGATIVE 01/19/2023 1026   HGBUR NEGATIVE 12/15/2023 0606   BILIRUBINUR NEGATIVE 12/15/2023 0606   KETONESUR NEGATIVE  12/15/2023 0606   PROTEINUR NEGATIVE 12/15/2023 0606   UROBILINOGEN 0.2 01/19/2023 1026   NITRITE NEGATIVE 12/15/2023 0606   LEUKOCYTESUR NEGATIVE 12/15/2023 0606    FURTHER DISCHARGE INSTRUCTIONS:   Get Medicines reviewed and adjusted: Please take all your medications with you for your next visit with your Primary MD   Laboratory/radiological data: Please request your Primary MD to go over all hospital tests and procedure/radiological results at the follow up, please ask your Primary MD to get all Hospital records sent to his/her office.   In some cases, they will be blood work, cultures and biopsy results pending at the time of your discharge. Please request that your primary care M.D. goes through all the records of your hospital data and follows up on these results.   Also Note the following: If you experience worsening of your admission symptoms, develop shortness of breath, life threatening emergency, suicidal or homicidal thoughts you must seek medical attention immediately by calling 911 or calling your MD immediately  if symptoms less severe.   You must read complete instructions/literature along with all the possible adverse reactions/side effects for all the Medicines you take and that have been prescribed to you. Take any new Medicines after you have completely understood and accpet all the possible adverse reactions/side effects.    Do not drive when taking Pain medications or sleeping medications (Benzodaizepines)   Do not take more than prescribed Pain, Sleep and Anxiety Medications. It is not advisable to combine anxiety,sleep and pain medications without talking with your primary care practitioner   Special Instructions: If you have smoked or chewed Tobacco  in the last 2 yrs please stop smoking, stop any regular Alcohol   and or any Recreational drug use.   Wear Seat belts while driving.   Please note: You were cared for by a hospitalist during your hospital stay. Once  you are discharged, your primary care physician will handle any further medical issues. Please note that NO REFILLS for any discharge medications will be authorized once you are discharged, as it is imperative that you return to your primary care physician (or establish a relationship with a primary care physician if you do not have one) for your post hospital discharge needs so that they can reassess your need for medications and monitor your lab values.  Time coordinating discharge: 35 minutes  SIGNED:  Nilda Fendt, MD, PhD 12/26/2023, 10:31 AM

## 2023-12-25 NOTE — Progress Notes (Signed)
PHARMACY - ANTICOAGULATION Pharmacy Consult for heparin Indication: DVT   No Known Allergies  Patient Measurements: Height: 6\' 2"  (188 cm) Weight: 121 kg (266 lb 12.1 oz) IBW/kg (Calculated) : 82.2 Heparin Dosing Weight: 108 kg  Vital Signs: Temp: 98.2 F (36.8 C) (12/30 0331) Temp Source: Oral (12/30 0331) BP: 128/83 (12/30 0331) Pulse Rate: 81 (12/30 0331)  Labs: Recent Labs    12/24/23 0357 12/24/23 1100 12/24/23 2133 12/25/23 0445  HGB 17.9*  --   --  17.5*  HCT 53.8*  --   --  53.1*  PLT 229  --   --  216  HEPARINUNFRC 0.77* 0.94* 0.42 0.20*    Estimated Creatinine Clearance: 135.7 mL/min (by C-G formula based on SCr of 0.86 mg/dL).   Medical History: Past Medical History:  Diagnosis Date   DDD (degenerative disc disease), lumbar    HNP (herniated nucleus pulposus)    Hypertension    Liver hemangioma    Morbid obesity (HCC)    OSA (obstructive sleep apnea)    no cpap used since weight loss   Overweight(278.02)    Plantar fasciitis of right foot    Sleep apnea    Tinea barbae       Assessment: 52 yo male with LLE DVT s/p craniotomy for tumor resection 12/18/23 for heparin   Heparin level this AM drifted down to subtherapeutic on 1050 units/hr  Goal of Therapy:  Heparin level 0.3-0.5 units/ml Monitor platelets by anticoagulation protocol: Yes   Plan:  Increase heparin gtt to 1150 units/hr F/u 6 hour heparin level F/u [facility] plan and ability to transition to PO  Daylene Posey, PharmD, [facility] Clinical Pharmacist ED Pharmacist Phone # [phone] 12/25/2023 7:20 AM

## 2023-12-25 NOTE — Progress Notes (Signed)
PROGRESS NOTE  Nicholas Stevens HYQ:[id number] DOB: 1971-09-25 DOA: 12/15/2023 PCP: Tresa Garter, MD   LOS: 10 days   Brief Narrative / Interim history: 52 year old male with known OSA admitted with right frontal mass with hemorrhage. Wife at bedside and assisting with history. Patient noticed a decrease in response time while driving yesterday and new onset episodes of decrease use of left side. This worsened this morning, with loss of coordination, including where he lost his balance and fell against the wall, denies hitting head or body, prompting to seek further medical treatment. CODE stroke imaging was obtained due to symptoms and revealed a partially hemorrhagic lesion in the superior right frontal lobe with local mass effect. 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 Status post craniotomy/tumor resection 12/18/23 without complication.   Subjective / 24h Interval events: Doing well. Wife at bedside. Reiterate they wish to speak with Dr Maisie Fus before any transfer to rehab  Assesement and Plan: Principal problem Principal Problem R frontal lobe hemorrhagic mass w/ brain compression - s/p tumor resection 12/18/23, has been placed on Keppra 500 mg twice daily as well as a dexamethasone taper.  Therapy recommended CIR and will be discharged in stable and improved condition once cleared by neurosurgery.   Active problems   RLE  pain/swelling - Vascular US showing DVT.  He is on heparin.  Awaiting neurosurgery input regarding oral agents  OSA - CPAP @ night time   Steroid induced hyperglycemia -continue sliding scale in inpatient rehab  Scheduled Meds:  bisacodyl  10 mg Rectal Once   dexamethasone  2 mg Oral Q12H   Followed by   Melene Muller ON 12/27/2023] dexamethasone  2 mg Oral Daily   insulin aspart  0-9 Units Subcutaneous TID WC    insulin aspart  5 Units Subcutaneous TID WC   latanoprost  1 drop Both Eyes QHS   levETIRAcetam  500 mg Oral BID   pantoprazole  40 mg Oral Daily   polyethylene glycol  17 g Oral BID   senna-docusate  1 tablet Oral BID   Continuous Infusions:  heparin 1,150 Units/hr (12/25/23 0816)   PRN Meds:.acetaminophen **OR** acetaminophen, docusate sodium, hydrALAZINE, HYDROcodone-acetaminophen, labetalol, ondansetron **OR** ondansetron (ZOFRAN) IV, mouth rinse, polyvinyl alcohol, promethazine, sodium phosphate, zolpidem  Current Outpatient Medications  Medication Instructions   Adapalene-Benzoyl Peroxide (EPIDUO) 0.1-2.5 % gel 1 application , Topical, Daily at bedtime, Apply to affected area 3 nights per week (Monday, Wednesday, Friday)   Cyanocobalamin (B-12 PO) 1 tablet, Oral, Daily   dexamethasone (DECADRON) 2 MG tablet Take 2 tablets (4 mg total) by mouth every 12 (twelve) hours for 1 day, THEN 1 tablet (2 mg total) every 12 (twelve) hours for 2 days, THEN 1 tablet (2 mg total) daily for 2 days.   doxycycline (VIBRA-TABS) 100 mg, Oral, Daily, TAKE WITH HEAVY FOOD   Fish Oil 1,000 mg, Oral, Daily   Ginger, Zingiber officinalis, (GINGER PO) 1 capsule, Oral, Daily   Ibuprofen (ADVIL LIQUI-GELS MINIS) 800 mg, Oral, Every 6 hours PRN   latanoprost (XALATAN) 0.005 % ophthalmic solution 1 drop, Both Eyes, Daily at bedtime   levETIRAcetam (KEPPRA) 500 mg, Oral, 2 times daily   Multiple Vitamin (MULTIVITAMIN WITH MINERALS) TABS tablet 1 tablet, Oral, Daily   pimecrolimus (ELIDEL) 1 % cream Topical, 2 times daily   tadalafil (CIALIS) 20 MG tablet TAKE  1 TABLET BY MOUTH DAILY AS NEEDED FOR ERECTILE DYSFUNCTION   triamterene-hydrochlorothiazide (DYAZIDE) 37.5-25 MG capsule 1 capsule, Oral, Daily   TURMERIC PO 1 capsule, Oral, Daily   Vitamin D3 2,000 Units, Oral, Daily   Wegovy 0.5 mg, Subcutaneous, Weekly   zolpidem (AMBIEN) 10 MG tablet TAKE 1 TABLET BY MOUTH EVERYDAY AT BEDTIME    Diet Orders (From  admission, onward)     Start     Ordered   12/18/23 1934  Diet Carb Modified Fluid consistency: Thin; Room service appropriate? Yes  Diet effective now       Question Answer Comment  Diet-HS Snack? Nothing   Calorie Level Medium 1600-2000   Fluid consistency: Thin   Room service appropriate? Yes      12/18/23 1934            DVT prophylaxis: SCDs Start: 12/18/23 1751 Place and maintain sequential compression device Start: 12/17/23 1237 SCD's Start: 12/15/23 1829 SCDs Start: 12/15/23 1056   Lab Results  Component Value Date   PLT 216 12/25/2023      Code Status: Full Code  Family Communication: wife at bedside  Status is: Inpatient Remains inpatient appropriate because: awaiting CIR  Level of care: Progressive  Consultants:  Neurosurgery   Objective: Vitals:   12/24/23 2336 12/25/23 0331 12/25/23 0751 12/25/23 1110  BP: (!) 136/90 128/83 119/74 (!) 140/90  Pulse: 95 81 65 80  Resp: 14 15 15 13   Temp: 98.1 F (36.7 C) 98.2 F (36.8 C) 98.1 F (36.7 C) 97.6 F (36.4 C)  TempSrc: Oral Oral Oral Oral  SpO2: 95% 96% 97% 97%  Weight:      Height:        Intake/Output Summary (Last 24 hours) at 12/25/2023 1648 Last data filed at 12/25/2023 0333 Gross per 24 hour  Intake 490.73 ml  Output --  Net 490.73 ml   Wt Readings from Last 3 Encounters:  12/24/23 121 kg  12/05/23 120.8 kg  04/06/23 121.6 kg    Examination:  Constitutional: NAD Eyes: no scleral icterus ENMT: Mucous membranes are moist.  Neck: normal, supple Respiratory: clear to auscultation bilaterally, no wheezing, no crackles. Normal respiratory effort.  Cardiovascular: Regular rate and rhythm, no murmurs / rubs / gallops. No LE edema.  Abdomen: non distended, no tenderness. Bowel sounds positive.  Musculoskeletal: no clubbing / cyanosis.    Data Reviewed: I have independently reviewed following labs and imaging studies   CBC Recent Labs  Lab 12/19/23 1323 12/24/23 0357  12/25/23 0445  WBC 10.0 12.7* 12.1*  HGB 17.7* 17.9* 17.5*  HCT 54.5* 53.8* 53.1*  PLT 280 229 216  MCV 83.3 81.6 82.3  MCH 27.1 27.2 27.1  MCHC 32.5 33.3 33.0  RDW 17.2* 17.6* 18.0*  LYMPHSABS 0.8  --   --   MONOABS 0.9  --   --   EOSABS 0.0  --   --   BASOSABS 0.0  --   --     Recent Labs  Lab 12/19/23 1323 12/20/23 2327  NA 138 139  K 3.6 4.5  CL 106 107  CO2 21* 22  GLUCOSE 130* 105*  BUN 18 18  CREATININE 0.94 0.86  CALCIUM 8.4* 8.6*  MG  --  2.4    ------------------------------------------------------------------------------------------------------------------ No results for input(s): "CHOL", "HDL", "LDLCALC", "TRIG", "CHOLHDL", "LDLDIRECT" in the last 72 hours.  Lab Results  Component Value Date   HGBA1C 5.4 12/15/2023   ------------------------------------------------------------------------------------------------------------------ No results for input(s): "TSH", "T4TOTAL", "T3FREE", "THYROIDAB"  in the last 72 hours.  Invalid input(s): "FREET3"  Cardiac Enzymes No results for input(s): "CKMB", "TROPONINI", "MYOGLOBIN" in the last 168 hours.  Invalid input(s): "CK" ------------------------------------------------------------------------------------------------------------------ No results found for: "BNP"  CBG: Recent Labs  Lab 12/24/23 1644 12/24/23 2106 12/25/23 0750 12/25/23 1109 12/25/23 1629  GLUCAP 238* 143* 132* 105* 147*    Recent Results (from the past 240 hours)  MRSA Next Gen by PCR, Nasal     Status: None   Collection Time: 12/15/23  8:28 PM   Specimen: Nasal Mucosa; Nasal Swab  Result Value Ref Range Status   MRSA by PCR Next Gen NOT DETECTED NOT DETECTED Final    Comment: (NOTE) The GeneXpert MRSA Assay (FDA approved for NASAL specimens only), is one component of a comprehensive MRSA colonization surveillance program. It is not intended to diagnose MRSA infection nor to guide or monitor treatment for MRSA infections. Test  performance is not FDA approved in patients less than 11 years old. Performed at [facility], [address], [place], [place] [address]   Surgical PCR screen     Status: None   Collection Time: 12/17/23  9:29 PM   Specimen: Nasal Mucosa; Nasal Swab  Result Value Ref Range Status   MRSA, PCR NEGATIVE NEGATIVE Final   Staphylococcus aureus NEGATIVE NEGATIVE Final    Comment: (NOTE) The Xpert SA Assay (FDA approved for NASAL specimens in patients 4 years of age and older), is one component of a comprehensive surveillance program. It is not intended to diagnose infection nor to guide or monitor treatment. Performed at [facility], [address], [place], [place] [address]      Radiology Studies: No results found.   Pamella Pert, MD, PhD [facility]  Between 7 am - 7 pm I am available, please contact me via Amion (for emergencies) or Securechat (non urgent messages)  Between 7 pm - 7 am I am not available, please contact night coverage MD/APP via Amion

## 2023-12-25 NOTE — Progress Notes (Signed)
    Providing Compassionate, Quality Care - Together   NEUROSURGERY PROGRESS NOTE     S: NAEs o/n   O: EXAM:  BP 119/74 (BP Location: Right Arm)   Pulse 65   Temp 98.1 F (36.7 C) (Oral)   Resp 15   Ht 6\' 2"  (1.88 m)   Wt 121 kg   SpO2 97%   BMI 34.25 kg/m     Awake, alert, oriented  Speech fluent, appropriate  PERRLA  Incision c/d/I RUE/RLE 5/5 LUE/LLE 4/5 LLE nonswollen, nontender   ASSESSMENT:  52 y.o. with   -R frontal mass s/p craniotomy for resection     PLAN: -Dc to CIR today? Pt/family wish to d/w Dr. Maisie Fus prior to dc.  -Decadron taper -Heparin per pharmacy -Call w/ questions/concerns.   Patrici Ranks, [facility]

## 2023-12-25 NOTE — Progress Notes (Signed)
Inpatient Rehab Admissions Coordinator:   Discussed with Dr. Elvera Lennox.  Plan for transition to oral AC on admit to rehab.  PA ordered repeat CT which will occur today and we will tentatively plan for admit to CIR tomorrow.   Estill Dooms, PT, DPT Admissions Coordinator [phone] 12/25/23  5:05 PM

## 2023-12-26 ENCOUNTER — Encounter (HOSPITAL_COMMUNITY): Payer: Self-pay | Admitting: Physical Medicine and Rehabilitation

## 2023-12-26 ENCOUNTER — Inpatient Hospital Stay (HOSPITAL_COMMUNITY): Payer: Commercial Managed Care - PPO

## 2023-12-26 ENCOUNTER — Inpatient Hospital Stay (HOSPITAL_COMMUNITY)
Admission: AD | Admit: 2023-12-26 | Discharge: 2023-12-31 | DRG: 949 | Disposition: A | Discharge status: Home / Self Care | Payer: Commercial Managed Care - PPO | Source: Intra-hospital | Attending: Physical Medicine and Rehabilitation | Admitting: Physical Medicine and Rehabilitation

## 2023-12-26 ENCOUNTER — Telehealth (HOSPITAL_COMMUNITY): Payer: Self-pay | Admitting: Pharmacy Technician

## 2023-12-26 ENCOUNTER — Other Ambulatory Visit (HOSPITAL_COMMUNITY): Payer: Self-pay

## 2023-12-26 ENCOUNTER — Other Ambulatory Visit: Payer: Self-pay

## 2023-12-26 DIAGNOSIS — R41840 Attention and concentration deficit: Secondary | ICD-10-CM | POA: Diagnosis present

## 2023-12-26 DIAGNOSIS — D689 Coagulation defect, unspecified: Secondary | ICD-10-CM | POA: Diagnosis present

## 2023-12-26 DIAGNOSIS — R270 Ataxia, unspecified: Secondary | ICD-10-CM | POA: Diagnosis present

## 2023-12-26 DIAGNOSIS — Z560 Unemployment, unspecified: Secondary | ICD-10-CM

## 2023-12-26 DIAGNOSIS — Z6834 Body mass index (BMI) 34.0-34.9, adult: Secondary | ICD-10-CM

## 2023-12-26 DIAGNOSIS — E669 Obesity, unspecified: Secondary | ICD-10-CM | POA: Diagnosis present

## 2023-12-26 DIAGNOSIS — D751 Secondary polycythemia: Secondary | ICD-10-CM | POA: Diagnosis present

## 2023-12-26 DIAGNOSIS — G936 Cerebral edema: Secondary | ICD-10-CM | POA: Diagnosis present

## 2023-12-26 DIAGNOSIS — Z8379 Family history of other diseases of the digestive system: Secondary | ICD-10-CM

## 2023-12-26 DIAGNOSIS — Z7985 Long-term (current) use of injectable non-insulin antidiabetic drugs: Secondary | ICD-10-CM

## 2023-12-26 DIAGNOSIS — R739 Hyperglycemia, unspecified: Secondary | ICD-10-CM | POA: Diagnosis present

## 2023-12-26 DIAGNOSIS — Z48811 Encounter for surgical aftercare following surgery on the nervous system: Principal | ICD-10-CM

## 2023-12-26 DIAGNOSIS — E611 Iron deficiency: Secondary | ICD-10-CM | POA: Diagnosis present

## 2023-12-26 DIAGNOSIS — K5900 Constipation, unspecified: Secondary | ICD-10-CM | POA: Diagnosis present

## 2023-12-26 DIAGNOSIS — I10 Essential (primary) hypertension: Secondary | ICD-10-CM | POA: Diagnosis present

## 2023-12-26 DIAGNOSIS — C719 Malignant neoplasm of brain, unspecified: Principal | ICD-10-CM

## 2023-12-26 DIAGNOSIS — T380X5A Adverse effect of glucocorticoids and synthetic analogues, initial encounter: Secondary | ICD-10-CM | POA: Diagnosis present

## 2023-12-26 DIAGNOSIS — D1803 Hemangioma of intra-abdominal structures: Secondary | ICD-10-CM | POA: Diagnosis present

## 2023-12-26 DIAGNOSIS — G8104 Flaccid hemiplegia affecting left nondominant side: Secondary | ICD-10-CM | POA: Diagnosis present

## 2023-12-26 DIAGNOSIS — Z9884 Bariatric surgery status: Secondary | ICD-10-CM

## 2023-12-26 DIAGNOSIS — Z8249 Family history of ischemic heart disease and other diseases of the circulatory system: Secondary | ICD-10-CM

## 2023-12-26 DIAGNOSIS — G4700 Insomnia, unspecified: Secondary | ICD-10-CM | POA: Diagnosis present

## 2023-12-26 DIAGNOSIS — Z8269 Family history of other diseases of the musculoskeletal system and connective tissue: Secondary | ICD-10-CM

## 2023-12-26 DIAGNOSIS — G4733 Obstructive sleep apnea (adult) (pediatric): Secondary | ICD-10-CM | POA: Diagnosis present

## 2023-12-26 DIAGNOSIS — R29810 Facial weakness: Secondary | ICD-10-CM | POA: Diagnosis present

## 2023-12-26 DIAGNOSIS — Z831 Family history of other infectious and parasitic diseases: Secondary | ICD-10-CM

## 2023-12-26 DIAGNOSIS — Z9889 Other specified postprocedural states: Secondary | ICD-10-CM

## 2023-12-26 DIAGNOSIS — M25551 Pain in right hip: Secondary | ICD-10-CM | POA: Diagnosis present

## 2023-12-26 DIAGNOSIS — I824Z9 Acute embolism and thrombosis of unspecified deep veins of unspecified distal lower extremity: Secondary | ICD-10-CM | POA: Insufficient documentation

## 2023-12-26 DIAGNOSIS — M722 Plantar fascial fibromatosis: Secondary | ICD-10-CM | POA: Diagnosis present

## 2023-12-26 DIAGNOSIS — I82452 Acute embolism and thrombosis of left peroneal vein: Secondary | ICD-10-CM | POA: Diagnosis present

## 2023-12-26 DIAGNOSIS — Z7901 Long term (current) use of anticoagulants: Secondary | ICD-10-CM

## 2023-12-26 DIAGNOSIS — D72829 Elevated white blood cell count, unspecified: Secondary | ICD-10-CM | POA: Diagnosis present

## 2023-12-26 DIAGNOSIS — L270 Generalized skin eruption due to drugs and medicaments taken internally: Secondary | ICD-10-CM | POA: Diagnosis not present

## 2023-12-26 DIAGNOSIS — Z9049 Acquired absence of other specified parts of digestive tract: Secondary | ICD-10-CM

## 2023-12-26 DIAGNOSIS — C711 Malignant neoplasm of frontal lobe: Secondary | ICD-10-CM | POA: Diagnosis present

## 2023-12-26 DIAGNOSIS — G9389 Other specified disorders of brain: Secondary | ICD-10-CM | POA: Diagnosis not present

## 2023-12-26 DIAGNOSIS — R531 Weakness: Secondary | ICD-10-CM | POA: Diagnosis not present

## 2023-12-26 DIAGNOSIS — L731 Pseudofolliculitis barbae: Secondary | ICD-10-CM

## 2023-12-26 DIAGNOSIS — Z9181 History of falling: Secondary | ICD-10-CM

## 2023-12-26 DIAGNOSIS — Z860100 Personal history of colon polyps, unspecified: Secondary | ICD-10-CM

## 2023-12-26 DIAGNOSIS — Z79899 Other long term (current) drug therapy: Secondary | ICD-10-CM

## 2023-12-26 LAB — CBC
HCT: 53.3 % — ABNORMAL HIGH (ref 39.0–52.0)
Hemoglobin: 17.5 g/dL — ABNORMAL HIGH (ref 13.0–17.0)
MCH: 27 pg (ref 26.0–34.0)
MCHC: 32.8 g/dL (ref 30.0–36.0)
MCV: 82.1 fL (ref 80.0–100.0)
Platelets: 214 10*3/uL (ref 150–400)
RBC: 6.49 MIL/uL — ABNORMAL HIGH (ref 4.22–5.81)
RDW: 17.8 % — ABNORMAL HIGH (ref 11.5–15.5)
WBC: 11.4 10*3/uL — ABNORMAL HIGH (ref 4.0–10.5)
nRBC: 0 % (ref 0.0–0.2)

## 2023-12-26 LAB — GLUCOSE, CAPILLARY
Glucose-Capillary: 108 mg/dL — ABNORMAL HIGH (ref 70–99)
Glucose-Capillary: 130 mg/dL — ABNORMAL HIGH (ref 70–99)
Glucose-Capillary: 125 mg/dL — ABNORMAL HIGH (ref 70–99)

## 2023-12-26 MED ORDER — PROCHLORPERAZINE EDISYLATE 10 MG/2ML IJ SOLN: 5.0000 mg | Freq: Four times a day (QID) | INTRAMUSCULAR | Status: DC | PRN

## 2023-12-26 MED ORDER — ADULT MULTIVITAMIN W/MINERALS CH
1.0000 | ORAL_TABLET | Freq: Every day | ORAL | Status: DC
  Administered 2023-12-27 – 2023-12-31 (×5): 1 via ORAL
  Filled 2023-12-26 (×5): qty 1

## 2023-12-26 MED ORDER — POLYETHYLENE GLYCOL 3350 17 G PO PACK: 17.0000 g | PACK | Freq: Two times a day (BID) | ORAL | Status: DC

## 2023-12-26 MED ORDER — ACETAMINOPHEN 325 MG PO TABS
325.0000 mg | ORAL_TABLET | ORAL | Status: DC | PRN
Start: 2023-12-26 — End: 2023-12-31
  Administered 2023-12-28: 650 mg via ORAL
  Filled 2023-12-26 (×2): qty 2

## 2023-12-26 MED ORDER — DOCUSATE SODIUM 100 MG PO CAPS: 100.0000 mg | ORAL_CAPSULE | Freq: Two times a day (BID) | ORAL | Status: DC | PRN

## 2023-12-26 MED ORDER — ORAL CARE MOUTH RINSE: 15.0000 mL | OROMUCOSAL | Status: DC | PRN

## 2023-12-26 MED ORDER — INSULIN ASPART 100 UNIT/ML IJ SOLN: 5.0000 [IU] | Freq: Three times a day (TID) | INTRAMUSCULAR | Status: DC

## 2023-12-26 MED ORDER — GUAIFENESIN-DM 100-10 MG/5ML PO SYRP: 5.0000 mL | ORAL_SOLUTION | Freq: Four times a day (QID) | ORAL | Status: DC | PRN

## 2023-12-26 MED ORDER — ZOLPIDEM TARTRATE 5 MG PO TABS
5.0000 mg | ORAL_TABLET | Freq: Every evening | ORAL | Status: DC | PRN
  Administered 2023-12-26 – 2023-12-30 (×5): 5 mg via ORAL
  Filled 2023-12-26 (×5): qty 1

## 2023-12-26 MED ORDER — SENNOSIDES-DOCUSATE SODIUM 8.6-50 MG PO TABS
2.0000 | ORAL_TABLET | Freq: Every day | ORAL | Status: DC
Start: 2023-12-27 — End: 2023-12-31
  Administered 2023-12-27 – 2023-12-31 (×5): 2 via ORAL
  Filled 2023-12-26 (×6): qty 2

## 2023-12-26 MED ORDER — INSULIN ASPART 100 UNIT/ML IJ SOLN
0.0000 [IU] | Freq: Three times a day (TID) | INTRAMUSCULAR | Status: DC
Start: 2023-12-26 — End: 2023-12-31
  Administered 2023-12-26 – 2023-12-27 (×3): 1 [IU] via SUBCUTANEOUS

## 2023-12-26 MED ORDER — HYDROCODONE-ACETAMINOPHEN 5-325 MG PO TABS: 1.0000 | ORAL_TABLET | ORAL | Status: DC | PRN

## 2023-12-26 MED ORDER — ALUM & MAG HYDROXIDE-SIMETH 200-200-20 MG/5ML PO SUSP: 30.0000 mL | ORAL | Status: DC | PRN

## 2023-12-26 MED ORDER — DIPHENHYDRAMINE HCL 25 MG PO CAPS
25.0000 mg | ORAL_CAPSULE | Freq: Four times a day (QID) | ORAL | Status: DC | PRN
  Administered 2023-12-28: 25 mg via ORAL
  Filled 2023-12-26: qty 1

## 2023-12-26 MED ORDER — HEPARIN SODIUM (PORCINE) 5000 UNIT/ML IJ SOLN: 5000.0000 [IU] | Freq: Three times a day (TID) | INTRAMUSCULAR | Status: DC

## 2023-12-26 MED ORDER — LEVETIRACETAM 500 MG PO TABS
500.0000 mg | ORAL_TABLET | Freq: Two times a day (BID) | ORAL | Status: DC
  Administered 2023-12-26 – 2023-12-31 (×10): 500 mg via ORAL
  Filled 2023-12-26 (×10): qty 1

## 2023-12-26 MED ORDER — LATANOPROST 0.005 % OP SOLN
1.0000 [drp] | Freq: Every day | OPHTHALMIC | Status: DC
  Administered 2023-12-26 – 2023-12-30 (×5): 1 [drp] via OPHTHALMIC
  Filled 2023-12-26: qty 2.5

## 2023-12-26 MED ORDER — BISACODYL 10 MG RE SUPP: 10.0000 mg | Freq: Every day | RECTAL | Status: DC | PRN

## 2023-12-26 MED ORDER — SENNOSIDES-DOCUSATE SODIUM 8.6-50 MG PO TABS: 1.0000 | ORAL_TABLET | Freq: Two times a day (BID) | ORAL | Status: DC

## 2023-12-26 MED ORDER — APIXABAN 5 MG PO TABS
5.0000 mg | ORAL_TABLET | Freq: Two times a day (BID) | ORAL | Status: DC
  Administered 2023-12-26 – 2023-12-31 (×10): 5 mg via ORAL
  Filled 2023-12-26 (×10): qty 1

## 2023-12-26 MED ORDER — APIXABAN 5 MG PO TABS: 5.0000 mg | ORAL_TABLET | Freq: Two times a day (BID) | ORAL | Status: DC

## 2023-12-26 MED ORDER — PANTOPRAZOLE SODIUM 40 MG PO TBEC
40.0000 mg | DELAYED_RELEASE_TABLET | Freq: Every day | ORAL | Status: DC
  Administered 2023-12-27 – 2023-12-31 (×5): 40 mg via ORAL
  Filled 2023-12-26 (×5): qty 1

## 2023-12-26 MED ORDER — PROCHLORPERAZINE MALEATE 5 MG PO TABS: 5.0000 mg | ORAL_TABLET | Freq: Four times a day (QID) | ORAL | Status: DC | PRN

## 2023-12-26 MED ORDER — CYANOCOBALAMIN 500 MCG PO TABS
500.0000 ug | ORAL_TABLET | Freq: Every day | ORAL | Status: DC
  Administered 2023-12-27 – 2023-12-31 (×5): 500 ug via ORAL
  Filled 2023-12-26 (×5): qty 1

## 2023-12-26 MED ORDER — HYDRALAZINE HCL 10 MG PO TABS: 10.0000 mg | ORAL_TABLET | Freq: Four times a day (QID) | ORAL | Status: DC | PRN

## 2023-12-26 MED ORDER — PROCHLORPERAZINE 25 MG RE SUPP: 12.5000 mg | Freq: Four times a day (QID) | RECTAL | Status: DC | PRN

## 2023-12-26 MED ORDER — POLYETHYLENE GLYCOL 3350 17 G PO PACK
17.0000 g | PACK | Freq: Every day | ORAL | Status: DC
  Administered 2023-12-27: 17 g via ORAL
  Filled 2023-12-26: qty 1

## 2023-12-26 MED ORDER — FLEET ENEMA RE ENEM: 1.0000 | ENEMA | Freq: Once | RECTAL | Status: DC | PRN

## 2023-12-26 NOTE — Telephone Encounter (Signed)
 Patient Product/process Development Scientist completed.    The patient is insured through [facility] Patient has [employer], may use a copay card, and/or apply for patient assistance if available.    Ran test claim for Eliquis  5 mg and the current 30 day co-pay is $30.00.   This test claim was processed through [facility]- copay amounts may vary at other pharmacies due to pharmacy/plan contracts, or as the patient moves through the different stages of their insurance plan.     Reyes Sharps, CPHT Pharmacy Technician III Certified Patient Advocate [facility] Pharmacy Patient Advocate Team Direct Number: [phone]  Fax: [phone]

## 2023-12-26 NOTE — TOC Transition Note (Signed)
 Transition of Care (TOC) - Discharge Note Rayfield Gobble RN, BSN Transitions of Care Unit 4E- RN Case Manager See Treatment Team for direct phone #   Patient Details  Name: Nicholas Stevens MRN: [id number] Date of Birth: 11-Oct-1971  Transition of Care [facility]) CM/SW Contact:  Gobble Rayfield Hurst, RN Phone Number: 12/26/2023, 11:47 AM   Clinical Narrative:    Notified this am by Davene CALKINS rehab liaison that pt has bed for admit to CIR today. Pt stable for transition to CIR- d/c order placed.   Pt has insurance auth and bed available for admit to [facility] later today. No further TOC needs noted.    Final next level of care: IP Rehab Facility Barriers to Discharge: Barriers Resolved   Patient Goals and CMS Choice Patient states their goals for this hospitalization and ongoing recovery are:: rehab then return home CMS Medicare.gov Compare Post Acute Care list provided to:: Patient Choice offered to / list presented to : Patient      Discharge Placement               [facility] re[facility]        Discharge Plan and Services Additional resources added to the After Visit Summary for   In-house Referral: NA Discharge Planning Services: CM Consult Post Acute Care Choice: IP Rehab          DME Arranged: N/A DME Agency: NA         HH Agency: NA        Social Drivers of Health (SDOH) Interventions SDOH Screenings   Food Insecurity: No Food Insecurity (12/18/2023)  Housing: Low Risk  (12/20/2023)  Transportation Needs: No Transportation Needs (12/18/2023)  Utilities: Not At Risk (12/18/2023)  Depression (PHQ2-9): Low Risk  (01/19/2023)  Financial Resource Strain: Low Risk  (04/02/2023)  Physical Activity: Insufficiently Active (04/02/2023)  Social Connections: Socially Integrated (04/02/2023)  Stress: No Stress Concern Present (04/02/2023)  Tobacco Use: Low Risk  (12/18/2023)     Readmission Risk Interventions    12/26/2023   11:47 AM  Readmission Risk  Prevention Plan  Medication Screening Complete  Transportation Screening Complete

## 2023-12-26 NOTE — H&P (Signed)
 Physical Medicine and Rehabilitation Admission H&P        Chief Complaint  Patient presents with   Functional deficits. Due to brain tumor      HPI:  Nicholas Stevens is a 52 year old male with history of OSA, HTN, Tinea barbae, obesity s/p gastric sleeve, liver hemangioma who was admitted on 12/15/23 with reports of decrease in coordination, minimal use of LUE/LLE and fall prompting treatment. MRI brain done revealing necrotic, hemorrhagic mass in superior right frontal lobe with features of glioblastoma and mass effect with very early right uncal herniation. Dr. Debby evaluated patient and recommended IV decadron  to reduce edema prior to resective surgery, SBP < 150 as well as metastatic work up. CT chest, abdomen, pelvis was negative for metastatic disease but showed giant right cavernous hemangioma with  exophytic left hepatic lobe hemangioma similar to MRI 10/2013. Follow up MRI brain 12/21 showed unchanged size of 4.7 X 3.8 X 4.8 cm mass with large vasogenic edema and 8 mm leftward shift.    Steroid induced hyperglycemia managed with novolog  and SSI. He underwent right frontal stereotactic  craniotomy for resection of frontal lobe tumor on 12/23 by Dr. Debby.  Post op has had improvement in LUE strength and follow up MRI showed good debulking of tumor with decrease in mass effect, collapse of lesion with region of enhancement  and 5 mm right to left shift. Pathology revealed high grade glioma grade 4 c/w glioblastoma. Was discussed by tumor board yesterday? Decadron  wean completed today. He has had improvement in left sided weakness as reported to have flaccid hemiplegia at admission. Therapy has been working with patient and he reported severe left calf pain with mobility on 12/22/23.    He was found to have calf tenderness with positive Hoffman's and doppler done revealed acute DVT in left peroneal veins. He is felt to be at high risk for clot propagation due to coagulopathy and  was started on IV heparin  on 12/28 per NS input. He has been stable on this dose with follow up CT head 12/31 showing post op changes with expected small amount of bleed products in resection cavity and similar vasogenic edema with partial effacement of right lateral ventricle.  He continues to be limited by left sided weakness, left inattention, has balance deficits with decreased safety awareness and is requiring min assist overall. He was independent and working PTA.  CIR recommended due to functional decline.     Pt reports no pain- maybe a little sore.  LBM today- no constipation.      Review of Systems  Constitutional:  Negative for chills and fever.  HENT:  Negative for hearing loss.   Eyes:  Negative for blurred vision and double vision.  Respiratory:  Negative for cough and shortness of breath.   Gastrointestinal:  Negative for heartburn and nausea.  Genitourinary:  Negative for dysuria.  Musculoskeletal:  Positive for joint pain (right hip pain/tightness at times.) and myalgias (left calf pain).  Skin:  Negative for rash.  Neurological:  Positive for weakness. Negative for dizziness and headaches.  Psychiatric/Behavioral:  The patient has insomnia. The patient is not nervous/anxious.   All other systems reviewed and are negative.           Past Medical History:  Diagnosis Date   DDD (degenerative disc disease), lumbar     HNP (herniated nucleus pulposus)     Hypertension     Liver hemangioma  Morbid obesity (HCC)     OSA (obstructive sleep apnea)      no cpap used since weight loss   Overweight(278.02)     Plantar fasciitis of right foot     Sleep apnea     Tinea barbae                 Past Surgical History:  Procedure Laterality Date   BREATH TEK H PYLORI N/A 08/20/2013    Procedure: BREATH TEK H PYLORI;  Surgeon: Donnice KATHEE Lunger, MD;  Location: [facility];  Service: General;  Laterality: N/A;   COLONOSCOPY WITH PROPOFOL  N/A 05/05/2022    Procedure:  COLONOSCOPY WITH PROPOFOL ;  Surgeon: Albertus Gordy HERO, MD;  Location: [facility];  Service: Gastroenterology;  Laterality: N/A;   CRANIOTOMY Right 12/18/2023    Procedure: Right Frontal Stereotactic Craniotomy for Resection of Tumor;  Surgeon: Debby Dorn MATSU, MD;  Location: [facility];  Service: Neurosurgery;  Laterality: Right;   ESOPHAGOGASTRODUODENOSCOPY (EGD) WITH PROPOFOL  N/A 03/15/2023    Procedure: ESOPHAGOGASTRODUODENOSCOPY (EGD) WITH PROPOFOL ;  Surgeon: Avram Lupita BRAVO, MD;  Location: [facility];  Service: Gastroenterology;  Laterality: N/A;   LAPAROSCOPIC APPENDECTOMY   2007    Dr Curvin   LAPAROSCOPIC GASTRIC SLEEVE RESECTION N/A 10/08/2013    Procedure: LAPAROSCOPIC SLEEVE GASTRECTOMY;  Surgeon: Donnice KATHEE Lunger, MD;  Location: [facility];  Service: General;  Laterality: N/A;   LUMBAR LAMINECTOMY/DECOMPRESSION MICRODISCECTOMY N/A 09/16/2015    Procedure: MICRO LUMBAR DECOMPRESSION, MICRODISCECTOMY L5 - S1;  Surgeon: Reyes Billing, MD;  Location: [facility];  Service: Orthopedics;  Laterality: N/A;   POLYPECTOMY   05/05/2022    Procedure: POLYPECTOMY;  Surgeon: Albertus Gordy HERO, MD;  Location: [facility];  Service: Gastroenterology;;   ROTATOR CUFF REPAIR Right 2005    Dr Harden               Family History  Problem Relation Age of Onset   Other Mother          hx of DJD   Liver disease Father          ETOH, Hep C   Hypertension Father     Colon cancer Neg Hx     Colon polyps Neg Hx     Esophageal cancer Neg Hx     Rectal cancer Neg Hx     Stomach cancer Neg Hx            Social History: Married. Used to work for [employer] and now is a Scientist, Research (physical Sciences). He  reports that he has never smoked. He has never been exposed to tobacco smoke. He has never used smokeless tobacco. He reports that he does not drink alcohol  and does not use drugs.     Allergies:  Allergies  No Known Allergies             Medications Prior to Admission  Medication Sig Dispense Refill   Cholecalciferol (VITAMIN D3)  2000 units capsule Take 1 capsule (2,000 Units total) daily by mouth. 100 capsule 3   Cyanocobalamin  (B-12 PO) Take 1 tablet by mouth daily.       doxycycline  (VIBRA -TABS) 100 MG tablet Take 1 tablet (100 mg total) by mouth daily. TAKE WITH HEAVY FOOD 30 tablet 4   Ginger, Zingiber officinalis, (GINGER PO) Take 1 capsule by mouth daily.       Ibuprofen (ADVIL LIQUI-GELS MINIS) 200 MG CAPS Take 800 mg by mouth every 6 (six) hours as needed (Pain).  latanoprost  (XALATAN ) 0.005 % ophthalmic solution Place 1 drop into both eyes at bedtime.       Multiple Vitamin (MULTIVITAMIN WITH MINERALS) TABS tablet Take 1 tablet by mouth daily.       Omega-3 Fatty Acids (FISH OIL) 1000 MG CAPS Take 1,000 mg by mouth daily.       pimecrolimus  (ELIDEL ) 1 % cream Apply topically 2 (two) times daily. (Patient taking differently: Apply 1 Application topically 2 (two) times daily as needed (Dermatitis).) 30 g 0   Semaglutide -Weight Management (WEGOVY ) 0.5 MG/0.5ML SOAJ Inject 0.5 mg into the skin once a week. 2 mL 3   tadalafil  (CIALIS ) 20 MG tablet TAKE 1 TABLET BY MOUTH DAILY AS NEEDED FOR ERECTILE DYSFUNCTION (Patient taking differently: Take 10 mg by mouth daily as needed for erectile dysfunction.) 12 tablet 5   triamterene -hydrochlorothiazide  (DYAZIDE ) 37.5-25 MG capsule Take 1 each (1 capsule total) by mouth daily. 90 capsule 3   TURMERIC PO Take 1 capsule by mouth daily.       zolpidem  (AMBIEN ) 10 MG tablet TAKE 1 TABLET BY MOUTH EVERYDAY AT BEDTIME 90 tablet 1   Adapalene -Benzoyl Peroxide  (EPIDUO ) 0.1-2.5 % gel Apply 1 application  topically at bedtime. Apply to affected area 3 nights per week (Monday, Wednesday, Friday) (Patient not taking: Reported on 12/15/2023) 45 g 3          Home: Home Living Family/patient expects to be discharged to:: Private residence Living Arrangements: Spouse/significant other Available Help at Discharge: Family, Available 24 hours/day Type of Home: House Home Access:  Level entry Home Layout: Two level, Able to live on main level with bedroom/bathroom Alternate Level Stairs-Number of Steps: 14 Alternate Level Stairs-Rails: Left Bathroom Shower/Tub: Health Visitor: Handicapped height Bathroom Accessibility: Yes Home Equipment: Shower seat, Hand held shower head Additional Comments: goes by DJ, 5 grandbabies   Functional History: Prior Function Prior Level of Function : Independent/Modified Independent, Working/employed, Driving Mobility Comments: independent, active ADLs Comments: independent, working in [employer], Therapeutic Status:  Mobility: Bed Mobility Overal bed mobility: Needs Assistance Bed Mobility: Supine to Sit Rolling: Mod assist Sidelying to sit: Min assist Supine to sit: Min assist General bed mobility comments: increased time and assistance to scoot to EOB Transfers Overall transfer level: Needs assistance Equipment used: 1 person hand held assist Transfers: Sit to/from Stand Sit to Stand: Min assist, Contact guard assist Bed to/from chair/wheelchair/BSC transfer type:: Stand pivot Stand pivot transfers: Mod assist, +2 physical assistance Transfer via Lift Equipment: Stedy General transfer comment: min to CGA for sit to stands and balance for transfers, cues for hand placement Ambulation/Gait Ambulation/Gait assistance: Min assist Gait Distance (Feet): 120 Feet Assistive device: 1 person hand held assist Gait Pattern/deviations: Step-through pattern General Gait Details: initial incoordination on L LE improved with cuing to weak heel toe pattern, some drift and instability with scanning.  L calf pain much improved and less disruptive to gait. Gait velocity: decr Gait velocity interpretation: <1.8 ft/sec, indicate of risk for recurrent falls Pre-gait activities: worked on w/shifts, unweighting for sidestepping, turning 360 deg min mod assist.  Notable more complex tasks throwing off coordination L  side.   ADL: ADL Overall ADL's : Needs assistance/impaired Eating/Feeding: Set up Grooming: Contact guard assist, Standing Upper Body Bathing: Maximal assistance, Sitting Lower Body Bathing: Maximal assistance, Sit to/from stand Upper Body Dressing : Minimal assistance, Standing Upper Body Dressing Details (indicate cue type and reason): gown for back Lower Body Dressing: Moderate assistance, Sitting/lateral leans Lower  Body Dressing Details (indicate cue type and reason): assistance for donning shoes seated on EOB Toilet Transfer: Minimal assistance, Contact guard assist, BSC/3in1 General ADL Comments: performed toilet transfers and self care in bathroom with min to CGA   Cognition: Cognition Overall Cognitive Status: Within Functional Limits for tasks assessed Orientation Level: Oriented X4 Cognition Arousal: Alert Behavior During Therapy: WFL for tasks assessed/performed Overall Cognitive Status: Within Functional Limits for tasks assessed Area of Impairment: Attention, Following commands, Safety/judgement, Problem solving, Awareness Current Attention Level: Selective Memory: Decreased short-term memory, Decreased recall of precautions Following Commands: Follows one step commands consistently Safety/Judgement: Decreased awareness of safety Awareness: Intellectual Problem Solving: Slow processing, Difficulty sequencing, Requires verbal cues, Requires tactile cues, Decreased initiation General Comments: occasional cue to attend to left, able to follow directions     Blood pressure 114/77, pulse 70, temperature 97.6 F (36.4 C), temperature source Oral, resp. rate 15, height 6' 2 (1.88 m), weight 121 kg, SpO2 95%. Physical Exam Vitals and nursing note reviewed. Exam conducted with a chaperone present.  Constitutional:      Comments: 2 family members at bedside; eating [employer]; sitting up in bed; c/o being cold, then hot; had electric blanket on bed!; appropriate, alert,  awake, NAD  HENT:     Head: Normocephalic.     Comments: Crani midline and to R side- staples intact- looks good- no drainage or erythema    Right Ear: External ear normal.     Left Ear: External ear normal.     Nose: Nose normal.  Eyes:     Extraocular Movements: Extraocular movements intact.     Pupils: Pupils are equal, round, and reactive to light.     Comments: No nystagmus  Cardiovascular:     Rate and Rhythm: Normal rate and regular rhythm.     Heart sounds: Normal heart sounds. No murmur heard.    No gallop.  Pulmonary:     Effort: Pulmonary effort is normal. No respiratory distress.     Breath sounds: Normal breath sounds. No wheezing, rhonchi or rales.  Abdominal:     General: Bowel sounds are normal. There is no distension.     Palpations: Abdomen is soft.     Tenderness: There is no abdominal tenderness.  Musculoskeletal:     Cervical back: Normal range of motion and neck supple.     Left lower leg: Left lower leg edema: min edema LLE.     Comments: RUE/RLE 5/5 LUE 5-/5 except grip 4+/5 LLE- 5-5/ throughout  Skin:    General: Skin is warm and dry.     Comments: Right crani incision CDI IV out- a few spots from blood draws, otherwise, skin looks good  Neurological:     Mental Status: He is alert and oriented to person, place, and time.     Comments: Mild left facial weakness with mild left inattention.Speech clear. Alert and appropriate.  Intact to light touch in all 4 extremities   Psychiatric:        Mood and Affect: Mood normal.        Behavior: Behavior normal.        Lab Results Last 48 Hours        Results for orders placed or performed during the hospital encounter of 12/15/23 (from the past 48 hours)  Glucose, capillary     Status: Abnormal    Collection Time: 12/24/23  4:44 PM  Result Value Ref Range    Glucose-Capillary 238 (H) 70 -  99 mg/dL      Comment: Glucose reference range applies only to samples taken after fasting for at least 8 hours.   Glucose, capillary     Status: Abnormal    Collection Time: 12/24/23  9:06 PM  Result Value Ref Range    Glucose-Capillary 143 (H) 70 - 99 mg/dL      Comment: Glucose reference range applies only to samples taken after fasting for at least 8 hours.  Heparin  level (unfractionated)     Status: None    Collection Time: 12/24/23  9:33 PM  Result Value Ref Range    Heparin  Unfractionated 0.42 0.30 - 0.70 IU/mL      Comment: (NOTE) The clinical reportable range upper limit is being lowered to >1.10 to align with the FDA approved guidance for the current laboratory assay.   If heparin  results are below expected values, and patient dosage has  been confirmed, suggest follow up testing of antithrombin III levels. Performed at [facility], [address], [place], [place] [address]    Heparin  level (unfractionated)     Status: Abnormal    Collection Time: 12/25/23  4:45 AM  Result Value Ref Range    Heparin  Unfractionated 0.20 (L) 0.30 - 0.70 IU/mL      Comment: (NOTE) The clinical reportable range upper limit is being lowered to >1.10 to align with the FDA approved guidance for the current laboratory assay.   If heparin  results are below expected values, and patient dosage has  been confirmed, suggest follow up testing of antithrombin III levels. Performed at [facility], [address] [place], [place] [address]    CBC     Status: Abnormal    Collection Time: 12/25/23  4:45 AM  Result Value Ref Range    WBC 12.1 (H) 4.0 - 10.5 K/uL    RBC 6.45 (H) 4.22 - 5.81 MIL/uL    Hemoglobin 17.5 (H) 13.0 - 17.0 g/dL    HCT 46.8 (H) 60.9 - 52.0 %    MCV 82.3 80.0 - 100.0 fL    MCH 27.1 26.0 - 34.0 pg    MCHC 33.0 30.0 - 36.0 g/dL    RDW 81.9 (H) 88.4 - 15.5 %    Platelets 216 150 - 400 K/uL    nRBC 0.0 0.0 - 0.2 %      Comment: Performed at [facility], [address], [place], [place] [address]  Glucose, capillary     Status: Abnormal    Collection Time: 12/25/23   7:50 AM  Result Value Ref Range    Glucose-Capillary 132 (H) 70 - 99 mg/dL      Comment: Glucose reference range applies only to samples taken after fasting for at least 8 hours.  Glucose, capillary     Status: Abnormal    Collection Time: 12/25/23 11:09 AM  Result Value Ref Range    Glucose-Capillary 105 (H) 70 - 99 mg/dL      Comment: Glucose reference range applies only to samples taken after fasting for at least 8 hours.  Heparin  level (unfractionated)     Status: None    Collection Time: 12/25/23  1:42 PM  Result Value Ref Range    Heparin  Unfractionated 0.37 0.30 - 0.70 IU/mL      Comment: (NOTE) The clinical reportable range upper limit is being lowered to >1.10 to align with the FDA approved guidance for the current laboratory assay.   If heparin  results are below  expected values, and patient dosage has  been confirmed, suggest follow up testing of antithrombin III levels. Performed at [facility], [address], [place], [place] [address]    Glucose, capillary     Status: Abnormal    Collection Time: 12/25/23  4:29 PM  Result Value Ref Range    Glucose-Capillary 147 (H) 70 - 99 mg/dL      Comment: Glucose reference range applies only to samples taken after fasting for at least 8 hours.  Glucose, capillary     Status: Abnormal    Collection Time: 12/25/23  9:20 PM  Result Value Ref Range    Glucose-Capillary 198 (H) 70 - 99 mg/dL      Comment: Glucose reference range applies only to samples taken after fasting for at least 8 hours.  CBC     Status: Abnormal    Collection Time: 12/26/23  6:41 AM  Result Value Ref Range    WBC 11.4 (H) 4.0 - 10.5 K/uL    RBC 6.49 (H) 4.22 - 5.81 MIL/uL    Hemoglobin 17.5 (H) 13.0 - 17.0 g/dL    HCT 46.6 (H) 60.9 - 52.0 %    MCV 82.1 80.0 - 100.0 fL    MCH 27.0 26.0 - 34.0 pg    MCHC 32.8 30.0 - 36.0 g/dL    RDW 82.1 (H) 88.4 - 15.5 %    Platelets 214 150 - 400 K/uL    nRBC 0.0 0.0 - 0.2 %      Comment: Performed at [facility], [address] [place], [place] [address]  Glucose, capillary     Status: Abnormal    Collection Time: 12/26/23  7:57 AM  Result Value Ref Range    Glucose-Capillary 108 (H) 70 - 99 mg/dL      Comment: Glucose reference range applies only to samples taken after fasting for at least 8 hours.  Glucose, capillary     Status: Abnormal    Collection Time: 12/26/23 11:35 AM  Result Value Ref Range    Glucose-Capillary 130 (H) 70 - 99 mg/dL      Comment: Glucose reference range applies only to samples taken after fasting for at least 8 hours.       Imaging Results (Last 48 hours)  CT HEAD WO CONTRAST ( ) Result Date: 12/26/2023 CLINICAL DATA:  Brain/CNS neoplasm, monitor. EXAM: CT HEAD WITHOUT CONTRAST TECHNIQUE: Contiguous axial images were obtained from the base of the skull through the vertex without intravenous contrast. RADIATION DOSE REDUCTION: This exam was performed according to the departmental dose-optimization program which includes automated exposure control, adjustment of the mA and/or kV according to patient size and/or use of iterative reconstruction technique. COMPARISON:  Head CT 12/15/2023.  MRI brain 12/19/2023. FINDINGS: Brain: Postoperative changes of right frontal craniotomy for resection of a mass centered within the right superior frontal gyrus. Expected small amount of blood products in the resection cavity. Similar extent of surrounding vasogenic edema with partial effacement of the right lateral ventricle. No hydrocephalus, extra-axial collection or midline shift. Vascular: No hyperdense vessel or unexpected calcification. Skull: Right frontal craniotomy. Sinuses/Orbits: No acute finding. Other: None. IMPRESSION: Postoperative changes of right frontal craniotomy for resection of a mass centered within the right superior frontal gyrus. Expected small amount of blood products in the resection cavity. Similar extent of surrounding vasogenic edema with partial  effacement of the right lateral ventricle. Electronically Signed   By: Ryan Chess M.D.   On:  12/26/2023 09:13           Blood pressure 114/77, pulse 70, temperature 97.6 F (36.4 C), temperature source Oral, resp. rate 15, height 6' 2 (1.88 m), weight 121 kg, SpO2 95%.   Medical Problem List and Plan: 1. Functional deficits secondary to right frontal glioblastoma s/p crani and resection             -patient may shower             -ELOS/Goals: 10-14 days, supervision goals with PT, OT, SLP 2.  Left peroneal DVT/Antithrombotics: -DVT/anticoagulation: on IV heparin  and to transition to DOAC today.              -antiplatelet therapy: N/A 3. Pain Management: Continue hydrocodone  prn.  4. Mood/Behavior/Sleep:LCSW to follow for evaluation and support.  --continue Ambien  for sleep. .              -antipsychotic agents: N/A   5. Neuropsych/cognition: This patient is capable of making decisions on his own behalf. 6. Skin/Wound Care: Monitor incision for healing. Routine pressure relief measures.  7. Fluids/Electrolytes/Nutrition: Monitor I/O. Check CMET in am 8. Seizure prophylaxis: On Keppra  500 mg BID. Decadron  weaned off 12/31 am. 9. Steroid induced hyperglycemia: Hgb A1C-5.4. has been on novolog  5 units TID-->will d/c and use SSI for elevated BS 10.  HTN: Monitor BP TID--SBP goal < 150.  --Was on Dyazide  PTA-->resume?  11. OSA: Continue CPAP--has been compliant at home also. 12. Constipation Continue Senna. Miralax  was added on 12/31 as last BM 12/26 13. Leucocytosis: Likely reactive due to steroids. Monitor for fevers and other signs of infection.  14. Polycythemia: Hgb 17.3 15. H/o Iron deficiency/ S/p gastric sleeve: Resume multivitamin.            Sharlet GORMAN Schmitz, PA-C 12/26/2023 I have personally performed a face to face diagnostic evaluation of this patient and formulated the key components of the plan.  Additionally, I have personally reviewed laboratory data, imaging  studies, as well as relevant notes and concur with the physician assistant's documentation above.   The patient's status has not changed from the original H&P.  Any changes in documentation from the acute care chart have been noted above.

## 2023-12-26 NOTE — Progress Notes (Signed)
Pt off unit for CT

## 2023-12-26 NOTE — Discharge Summary (Signed)
  Physician Discharge Summary  Patient ID: Nicholas Stevens MRN: [id number] DOB/AGE: 09-22-1971 52 y.o.  Admit date: 12/15/2023 Discharge date: 12/26/2023  Admission Diagnoses:  Discharge Diagnoses:  Principal Problem:   Brain mass   Discharged Condition: good  Hospital Course: Patient admitted to the hospital for evaluation of right frontal hemorrhagic mass lesion.  Patient underwent craniotomy and resection of hemorrhagic tumor.  Pathology consistent with glioblastoma.  Postoperatively doing very well.  Minimal headache.  Made a very good functional recovery with good return of strength and motor planning on the left.  Patient is progressing with therapies and is ready for discharge to inpatient rehabilitation.  Consults:   Significant Diagnostic Studies:   Treatments:   Discharge Exam: Blood pressure 114/77, pulse 70, temperature 97.6 F (36.4 C), temperature source Oral, resp. rate 15, height 6' 2 (1.88 m), weight 121 kg, SpO2 95%. Awake and alert.  Oriented and appropriate.  Motor and sensory function with some trace weakness on the left side.  Wound healing well.  Chest and abdomen benign.  Disposition: Discharge disposition: [facility] Not Defined        Allergies as of 12/26/2023   No Known Allergies      Medication List     STOP taking these medications    Adapalene -Benzoyl Peroxide  0.1-2.5 % gel Commonly known as: Epiduo    Advil Liqui-Gels minis 200 MG Caps Generic drug: Ibuprofen   doxycycline  100 MG tablet Commonly known as: VIBRA -TABS   GINGER PO   tadalafil  20 MG tablet Commonly known as: CIALIS    triamterene -hydrochlorothiazide  37.5-25 MG capsule Commonly known as: DYAZIDE    TURMERIC PO   Wegovy  0.5 MG/0.5ML Soaj Generic drug: Semaglutide -Weight Management       TAKE these medications    apixaban  5 MG Tabs tablet Commonly known as: ELIQUIS  Take 1 tablet (5 mg total) by mouth 2 (two) times daily.    B-12 PO Take 1 tablet by mouth daily.   dexamethasone  2 MG tablet Commonly known as: DECADRON  Take 2 tablets (4 mg total) by mouth every 12 (twelve) hours for 1 day, THEN 1 tablet (2 mg total) every 12 (twelve) hours for 2 days, THEN 1 tablet (2 mg total) daily for 2 days. Start taking on: December 23, 2023   Fish Oil 1000 MG Caps Take 1,000 mg by mouth daily.   latanoprost  0.005 % ophthalmic solution Commonly known as: XALATAN  Place 1 drop into both eyes at bedtime.   levETIRAcetam  500 MG tablet Commonly known as: KEPPRA  Take 1 tablet (500 mg total) by mouth 2 (two) times daily.   multivitamin with minerals Tabs tablet Take 1 tablet by mouth daily.   pimecrolimus  1 % cream Commonly known as: Elidel  Apply topically 2 (two) times daily. What changed:  how much to take when to take this reasons to take this   Vitamin D3 50 MCG (2000 UT) capsule Take 1 capsule (2,000 Units total) daily by mouth.   zolpidem  10 MG tablet Commonly known as: AMBIEN  TAKE 1 TABLET BY MOUTH EVERYDAY AT BEDTIME         Signed: Victory DELENA Stevens 12/26/2023, 11:49 AM

## 2023-12-26 NOTE — Progress Notes (Signed)
 Occupational Therapy Treatment Patient Details Name: Nicholas Stevens MRN: [id number] DOB: 1971/04/12 Today's Date: 12/26/2023   History of present illness 52 yo male presenting 12/20 with decreased response time while driving, incoordination, imbalance. Imaging showed necrotic and hemorrhagic mass in the superior right frontal lobe with features of glioblastoma with mass effect. S/p R craniotomy 12/23. PMH includes: obesity and OSA.   OT comments  Patient demonstrating good gains with OT treatment with min assist to get to EOB, for sit to stands, and min to CGA for transfers. Patient able to assist with donning shoes with patient assisting with tieing shoes. Patient performed path finding in hallway with min verbal cues and min to CGA for balance. Patient required occasional cues to attend to left. Patient will benefit from intensive inpatient follow up therapy, >3 hours/day for continued OT treatment to increase independence and safety with self care and functional transfers. Acute OT to continue to follow.      If plan is discharge home, recommend the following:  A little help with walking and/or transfers;A lot of help with bathing/dressing/bathroom   Equipment Recommendations  BSC/3in1    Recommendations for Other Services      Precautions / Restrictions Precautions Precautions: Fall Precaution Comments: s/p crani Restrictions Weight Bearing Restrictions Per Provider Order: No       Mobility Bed Mobility Overal bed mobility: Needs Assistance Bed Mobility: Supine to Sit     Supine to sit: Min assist     General bed mobility comments: increased time and assistance to scoot to EOB    Transfers Overall transfer level: Needs assistance Equipment used: 1 person hand held assist Transfers: Sit to/from Stand Sit to Stand: Min assist, Contact guard assist           General transfer comment: min to CGA for sit to stands and balance for transfers, cues for hand  placement     Balance Overall balance assessment: Needs assistance Sitting-balance support: Single extremity supported, Feet supported Sitting balance-Leahy Scale: Fair Sitting balance - Comments: able to assist with donning shoes seated on EOB   Standing balance support: Single extremity supported, During functional activity Standing balance-Leahy Scale: Fair Standing balance comment: able to stand at sink for hand hygiene with no UE support                           ADL either performed or assessed with clinical judgement   ADL Overall ADL's : Needs assistance/impaired     Grooming: Contact guard assist;Standing           Upper Body Dressing : Minimal assistance;Standing Upper Body Dressing Details (indicate cue type and reason): gown for back Lower Body Dressing: Moderate assistance;Sitting/lateral leans Lower Body Dressing Details (indicate cue type and reason): assistance for donning shoes seated on EOB Toilet Transfer: Minimal assistance;Contact guard assist;BSC/3in1             General ADL Comments: performed toilet transfers and self care in bathroom with min to CGA    Extremity/Trunk Assessment              Vision       Perception     Praxis      Cognition Arousal: Alert Behavior During Therapy: [facility] for tasks assessed/performed Overall Cognitive Status: Within Functional Limits for tasks assessed  Following Commands: Follows one step commands consistently Safety/Judgement: Decreased awareness of safety     General Comments: occasional cue to attend to left, able to follow directions        Exercises      Shoulder Instructions       General Comments able to perform pathfinding in hallway with min verba cues    Pertinent Vitals/ Pain       Pain Assessment Pain Assessment: No/denies pain Pain Intervention(s): Monitored during session  Home Living                                           Prior Functioning/Environment              Frequency  Min 1X/week        Progress Toward Goals  OT Goals(current goals can now be found in the care plan section)  Progress towards OT goals: Progressing toward goals  Acute Rehab OT Goals Patient Stated Goal: get more rehab OT Goal Formulation: With patient/family Time For Goal Achievement: 01/02/24 ADL Goals Pt Will Perform Lower Body Dressing: with modified independence;sit to/from stand Pt Will Transfer to Toilet: with modified independence;ambulating;regular height toilet Additional ADL Goal #1: pt will complete 5 step pathfinding task with written instructions supervision level  Plan      Co-evaluation                 AM-PAC OT 6 Clicks Daily Activity     Outcome Measure   Help from another person eating meals?: A Little Help from another person taking care of personal grooming?: A Little Help from another person toileting, which includes using toliet, bedpan, or urinal?: A Lot Help from another person bathing (including washing, rinsing, drying)?: A Lot Help from another person to put on and taking off regular upper body clothing?: A Lot Help from another person to put on and taking off regular lower body clothing?: A Lot 6 Click Score: 14    End of Session Equipment Utilized During Treatment: Gait belt  OT Visit Diagnosis: Unsteadiness on feet (R26.81);Muscle weakness (generalized) (M62.81)   Activity Tolerance Patient tolerated treatment well   Patient Left in chair;with call bell/phone within reach;with family/visitor present   Nurse Communication Mobility status        Time: 9186-9167 OT Time Calculation (min): 19 min  Charges: OT General Charges $OT Visit: 1 Visit OT Treatments $Self Care/Home Management : 8-22 mins  Nicholas Stevens, OTA Acute Rehabilitation Services  Office [phone]   Nicholas Stevens 12/26/2023, 10:49 AM

## 2023-12-26 NOTE — Progress Notes (Signed)
 Inpatient Rehabilitation Admission Medication Review by a Pharmacist  A complete drug regimen review was completed for this patient to identify any potential clinically significant medication issues.  High Risk Drug Classes Is patient taking? Indication by Medication  Antipsychotic Yes, as an intravenous medication Compazine  PRN- n/v   Anticoagulant Yes Apixaban  - DVT   Antibiotic No   Opioid Yes Vicodin PRN- acute pain   Antiplatelet No   Hypoglycemics/insulin  Yes Insulin  aspart - DM   Vasoactive Medication Yes, as an intravenous medication Hydralazine  PRN- HTN   Chemotherapy No   Other Yes Keppra - seizure prophylaxis  Pantoprazole - reflux  MVI, cyanocobalamin  - supplement  Acetaminophen  PRN- pain  Maalox- dyspepsia  Bisacodyl  PRN, Senokot-S, fleet enema, miralax - constipation Guaifenesin  PRN- cough  Zolpidem  PRN- sleep      Type of Medication Issue Identified Description of Issue Recommendation(s)  Drug Interaction(s) (clinically significant)     Duplicate Therapy     Allergy     No Medication Administration End Date     Incorrect Dose     Additional Drug Therapy Needed     Significant med changes from prior encounter (inform family/care partners about these prior to discharge). PTA meds stopped inpatient- Dyazide , Wegovy , Cialis  PRN    Other       Clinically significant medication issues were identified that warrant physician communication and completion of prescribed/recommended actions by midnight of the next day:  Yes  Name of provider notified for urgent issues identified: Sharlet Schmitz, PA   Provider Method of Notification: Secure Chat    Pharmacist comments: Patient started on apixaban  inpatient for DVT without lad, LD 12/31 AM. Messaged provider to discontinue order for subcutaneous heparin  and resume Eliquis . Orders updated.   Time spent performing this drug regimen review (minutes):  30   Massie Fila, PharmD Clinical Pharmacist  12/26/2023 2:10 PM

## 2023-12-26 NOTE — Progress Notes (Signed)
 Inpatient Rehab Admissions Coordinator:   I have a bed for this patient to admit to CIR today.  Dr. Trixie in agreement and pt/family agreeable to transfer to CIR.  I will make arrangements and let TOC know.   Reche Lowers, PT, DPT Admissions Coordinator [phone] 12/26/23  11:15 AM

## 2023-12-26 NOTE — Plan of Care (Signed)
  Problem: Consults Goal: RH BRAIN INJURY PATIENT EDUCATION Description: Description: See Patient Education module for eduction specifics Outcome: Progressing   Problem: RH BOWEL ELIMINATION Goal: RH STG MANAGE BOWEL WITH ASSISTANCE Description: STG Manage Bowel with mod I Assistance. Outcome: Progressing Goal: RH STG MANAGE BOWEL W/MEDICATION W/ASSISTANCE Description: STG Manage Bowel with Medication with mod I Assistance. Outcome: Progressing   Problem: RH SAFETY Goal: RH STG ADHERE TO SAFETY PRECAUTIONS W/ASSISTANCE/DEVICE Description: STG Adhere to Safety Precautions With cues Assistance/Device. Outcome: Progressing   Problem: RH COGNITION-NURSING Goal: RH STG USES MEMORY AIDS/STRATEGIES W/ASSIST TO PROBLEM SOLVE Description: STG Uses Memory Aids/Strategies With cues Assistance to Problem Solve. Outcome: Progressing   Problem: RH PAIN MANAGEMENT Goal: RH STG PAIN MANAGED AT OR BELOW PT'S PAIN GOAL Description: < 4 with prns Outcome: Progressing   Problem: RH KNOWLEDGE DEFICIT BRAIN INJURY Goal: RH STG INCREASE KNOWLEDGE OF SELF CARE AFTER BRAIN INJURY Description: Patient and spouse will be able to manage care at discharge using educational resources for medications and dietary modifications independently Outcome: Progressing

## 2023-12-26 NOTE — Progress Notes (Signed)
Pt back on unit from CT

## 2023-12-26 NOTE — Progress Notes (Signed)
 Patient ID: Nicholas Stevens, male   DOB: Jan 21, 1971, 52 y.o.   MRN: [id number] Met with the patient and wife to review current medical situation, rehab schedule, team conference and plan of care. Reviewed medications and dietary modifications. Continue to follow along to address educational needs to facilitate preparation for discharge. Fredericka Barnie NOVAK

## 2023-12-26 NOTE — Discharge Instructions (Addendum)
 Inpatient Rehab Discharge Instructions  Nicholas Stevens Discharge date and time:  12/31/23  Activities/Precautions/ Functional Status: Activity: no lifting, driving, or strenuous exercise till cleared by MD Diet: diabetic diet Wound Care: keep wound clean and dry   Functional status:  ___ No restrictions     ___ Walk up steps independently ___ 24/7 supervision/assistance   ___ Walk up steps with assistance ___ Intermittent supervision/assistance  ___ Bathe/dress independently ___ Walk with walker     ___ Bathe/dress with assistance ___ Walk Independently     ___ Shower independently ___ Walk with assistance     ___ Shower with assistance __X_ No alcohol       ___ Return to work/school ________   Special Instructions:  COMMUNITY REFERRALS UPON DISCHARGE:    Outpatient: PT     OT    ST             Agency: [facility]             Phone: [phone]             Appointment Date/Time: *Please expect follow-up within 7-10 business days to schedule your home visit. If you have not received follow-up be sure to contact the site directly. You are always welcome to call prior to this window of time. *   My questions have been answered and I understand these instructions. I will adhere to these goals and the provided educational materials after my discharge from the hospital.  Patient/Caregiver Signature _______________________________ Date __________  Clinician Signature _______________________________________ Date __________  Please bring this form and your medication list with you to all your follow-up doctor's appointments.   Information on my medicine - ELIQUIS  (apixaban )   Why was Eliquis  prescribed for you? Eliquis  was prescribed to treat blood clots that may have been found in the veins of your legs (deep vein thrombosis) or in your lungs (pulmonary embolism) and to reduce the risk of them occurring again.  What do You need to know about Eliquis  ? The dose is  ONE 5 mg tablet taken TWICE daily.  Eliquis  may be taken with or without food.   Try to take the dose about the same time in the morning and in the evening. If you have difficulty swallowing the tablet whole please discuss with your pharmacist how to take the medication safely.  Take Eliquis  exactly as prescribed and DO NOT stop taking Eliquis  without talking to the doctor who prescribed the medication.  Stopping may increase your risk of developing a new blood clot.  Refill your prescription before you run out.  After discharge, you should have regular check-up appointments with your healthcare provider that is prescribing your Eliquis .    What do you do if you miss a dose? If a dose of ELIQUIS  is not taken at the scheduled time, take it as soon as possible on the same day and twice-daily administration should be resumed. The dose should not be doubled to make up for a missed dose.  Important Safety Information A possible side effect of Eliquis  is bleeding. You should call your healthcare provider right away if you experience any of the following: Bleeding from an injury or your nose that does not stop. Unusual colored urine (red or dark brown) or unusual colored stools (red or black). Unusual bruising for unknown reasons. A serious fall or if you hit your head (even if there is no bleeding).  Some medicines may interact with Eliquis  and might increase your risk  of bleeding or clotting while on Eliquis . To help avoid this, consult your healthcare provider or pharmacist prior to using any new prescription or non-prescription medications, including herbals, vitamins, non-steroidal anti-inflammatory drugs (NSAIDs) and supplements.  This website has more information on Eliquis  (apixaban ): http://www.eliquis .com/eliquis dena

## 2023-12-26 NOTE — Discharge Instructions (Signed)
 Information on my medicine - ELIQUIS  (apixaban )  This medication education was reviewed with me or my healthcare representative as part of my  discharge preparation. The pharmacist that spoke with me during my hospital stay was:  Rankin Sams, PharmD (pharmacist name) WHY WAS ELIQUIS  PRESCRIBED FOR YOU?  Eliquis  was prescribed to treat blood clots that may have been found in the veins of your legs  (deep vein thrombosis) or in your lungs (pulmonary embolism) and to reduce the risk of them  occurring again. WHAT DO YOU NEED TO KNOW ABOUT ELIQUIS  ?  The the dose is reduced to ONE 5 mg tablet taken TWICE daily. Eliquis  may be taken with or without food.  Try to take the dose about the same time in the morning and in the evening. If you have difficulty  swallowing the tablet whole please discuss with your pharmacist how to take the medication  safely. Take Eliquis  exactly as prescribed and DO NOT stop taking Eliquis  without talking to the doctor  who prescribed the medication. Stopping may increase your risk of developing a new blood  clot. Refill your prescription before you run out.  After discharge, you should have regular check-up appointments with your healthcare  provider that is prescribing your Eliquis .  WHAT DO YOU DO IF YOU MISS A DOSE? If a dose of ELIQUIS  is not taken at the scheduled time, take it as soon as possible on the same  day and twice-daily administration should be resumed. The dose should not be doubled to make  up for a missed dose.   IMPORTANT SAFETY INFORMATION A possible side effect of Eliquis  is bleeding. You should call your healthcare provider right away  if you experience any of the following:  ? Bleeding from an injury or your nose that does not stop. ? Unusual colored urine (red or dark brown) or unusual colored stools (red or black). ? Unusual bruising for unknown reasons. ? A serious fall or if you hit your head (even if there is no  bleeding). Some medicines may interact with Eliquis  and might increase your risk of bleeding or clotting  while on Eliquis . To help avoid this, consult your healthcare provider or pharmacist prior to using  any new prescription or non-prescription medications, including herbals, vitamins, non-steroidal  anti-inflammatory drugs (NSAIDs) and supplements.  This website has more information on Eliquis  (apixaban ): www.Eliquis .com.

## 2023-12-26 NOTE — Progress Notes (Signed)
 Admitted to CIR today. Admission/Assessments complete. Oriented patient to unit, assigned room, and discussed unit protocols/ alarms.   Tilden Dome, LPN

## 2023-12-26 NOTE — Progress Notes (Signed)
 Order to discharge patient from 4NP-6 and transfer to CIR. Report called to [facility]. Family at bedside. PIV removed by this RN. Crani surgical incision clean, dry. Personal belongings sent with patient. Patient A&O x 4.

## 2023-12-26 NOTE — Progress Notes (Signed)
 Pt. Has home cpap. RT assisted pt. With sterile water for his unit. Pt. Can place on when ready.

## 2023-12-26 NOTE — Progress Notes (Signed)
 Cornelio Bouchard, MD  Physician Physical Medicine and Rehabilitation   PMR Pre-admission    Signed   Date of Service: 12/26/2023 11:15 AM  Related encounter: ED to Hosp-Admission (Discharged) from 12/15/2023 in  [facility]   Signed     Expand All Collapse All  PMR Admission Coordinator Pre-Admission Assessment   Patient: Nicholas Stevens is an 52 y.o., male MRN: [id number] DOB: 1971/01/10 Height: 6' 2 (188 cm) Weight: 120.6 kg   Insurance Information HMO:     PPO: no     PCP:      IPA:      80/20:      OTHER:  PRIMARY: [employer]      Policy#: [id number]       Subscriber: pt's spouse  CM Name: Olam      Phone#: [phone]     Fax#: [phone] Pre-Cert#: [id number]   Received insurance approval on 12/26/23 from [place]. Pt approved for 7 days from 12/26/23-01/02/24. Updates due on 01/01/23.   Employer:  Benefits:  Phone #: [email or web address]     Name:  Eff. Date: 12/26/22-12/26/23     Deduct: $500 ($500 met)      Out of Pocket Max: $2,000 ($2,000 met)      Life Max: NA CIR: 80% coverage, 20% co-insurance      SNF: 80% coverage, 20% co-insurance Outpatient: 80% coverage     Co-Pay: 20% co-insurance Home Health: 80% coverage      Co-Pay: 20% co-insurance DME: 80% coverage     Co-Pay: 20% co-insurance Providers: in-network SECONDARY:       Policy#:      Phone#:    Artist:       Phone#:    The Data Processing Manager" for patients in Inpatient Rehabilitation Facilities with attached "Privacy Act Statement-Health Care Records" was provided and verbally reviewed with: Patient and Family   Emergency Contact Information Contact Information       Name Relation Home Work Mobile    Winn Spouse [phone]   [phone]    Pole,Gilda Mother [phone]   [phone]         Other Contacts   None on File        Patient Admitting Diagnosis: brain mass   History of Present Illness: Pt is a 52 y/o male with PMH of obesity  and OSA who was admitted to [facility] on 12/20 with decreased response time, L hemiparesis, 8/10 HA.  He did have a fall the day of admission without injury.  In ED he was hemodynamically stable.  Labs notable for Hgb 17.3, but otherwise not concerning.  Imaging revealed R superior frontal lobe mass with surrounding vasogenic edema, suspicious for glioblastoma.  Neurosurgery was consulted and recommended Decadron , metastatic workup, tight BP control, and surgical resection, which was performed by DDr. Debby on 12/23.  CT chest/abdomen/pelvis showed no concern for metastatic disease. Hospital course complicated by LLE DVT and pt was started on heparin  gtt per NS recommendations (no loading dose). Will plan to transition to oral AC week of 12/30. Therapy evaluations completed and pt was recommended for CIR.  Complete NIHSS TOTAL: 2   Patient's medical record from [facility] has been reviewed by the rehabilitation admission coordinator and physician.   Past Medical History      Past Medical History:  Diagnosis Date   DDD (degenerative disc disease), lumbar     HNP (herniated nucleus pulposus)     Hypertension  Liver hemangioma     Morbid obesity (HCC)     OSA (obstructive sleep apnea)      no cpap used since weight loss   Overweight(278.02)     Plantar fasciitis of right foot     Sleep apnea     Tinea barbae            Has the patient had major surgery during 100 days prior to admission? Yes   Family History   family history includes Hypertension in his father; Liver disease in his father; Other in his mother.   Current Medications  Current Medications    Current Facility-Administered Medications:    acetaminophen  (TYLENOL ) tablet 650 mg, 650 mg, Oral, Q4H PRN, 650 mg at 12/19/23 1723 **OR** acetaminophen  (TYLENOL ) suppository 650 mg, 650 mg, Rectal, Q4H PRN, Johnanna Credit Caylin, PA-C   apixaban  (ELIQUIS ) tablet 5 mg, 5 mg, Oral, BID, Salam, Savannah B, RPH, 5 mg at 12/26/23  9162   bisacodyl  (DULCOLAX) suppository 10 mg, 10 mg, Rectal, Once, Agarwala, Fredia, MD   [COMPLETED] dexamethasone  (DECADRON ) tablet 4 mg, 4 mg, Oral, Q6H, 4 mg at 12/20/23 1316 **FOLLOWED BY** [COMPLETED] dexamethasone  (DECADRON ) tablet 4 mg, 4 mg, Oral, Q8H, 4 mg at 12/22/23 1418 **FOLLOWED BY** [COMPLETED] dexamethasone  (DECADRON ) tablet 4 mg, 4 mg, Oral, Q12H, 4 mg at 12/24/23 0857 **FOLLOWED BY** [COMPLETED] dexamethasone  (DECADRON ) tablet 2 mg, 2 mg, Oral, Q12H, 2 mg at 12/26/23 0836 **FOLLOWED BY** [START ON 12/27/2023] dexamethasone  (DECADRON ) tablet 2 mg, 2 mg, Oral, Daily, Johnanna, Sara Caylin, PA-C   docusate sodium  (COLACE) capsule 100 mg, 100 mg, Oral, BID PRN, Hicks, Samantha B, NP   hydrALAZINE  (APRESOLINE ) injection 10 mg, 10 mg, Intravenous, Q4H PRN, Hicks, Samantha B, NP, 10 mg at 12/23/23 1711   HYDROcodone -acetaminophen  (NORCO/VICODIN) 5-325 MG per tablet 1 tablet, 1 tablet, Oral, Q4H PRN, Colon Shove, MD, 1 tablet at 12/21/23 1244   insulin  aspart (novoLOG ) injection 0-9 Units, 0-9 Units, Subcutaneous, TID WC, Hicks, Samantha B, NP, 1 Units at 12/25/23 1637   insulin  aspart (novoLOG ) injection 5 Units, 5 Units, Subcutaneous, TID WC, Agarwala, Ravi, MD, 5 Units at 12/26/23 0847   labetalol  (NORMODYNE ) injection 10-40 mg, 10-40 mg, Intravenous, Q10 min PRN, Johnanna Credit Caylin, PA-C, 20 mg at 12/19/23 1125   latanoprost  (XALATAN ) 0.005 % ophthalmic solution 1 drop, 1 drop, Both Eyes, QHS, Andrez Chroman, NP, 1 drop at 12/25/23 2157   levETIRAcetam  (KEPPRA ) tablet 500 mg, 500 mg, Oral, BID, Agarwala, Ravi, MD, 500 mg at 12/26/23 9163   ondansetron  (ZOFRAN ) tablet 4 mg, 4 mg, Oral, Q4H PRN **OR** ondansetron  (ZOFRAN ) injection 4 mg, 4 mg, Intravenous, Q4H PRN, Tomlinson, Sara Caylin, PA-C   Oral care mouth rinse, 15 mL, Mouth Rinse, PRN, Kassie Acquanetta Bradley, MD   pantoprazole  (PROTONIX ) EC tablet 40 mg, 40 mg, Oral, Daily, Agarwala, Ravi, MD, 40 mg at 12/26/23 9162   polyethylene  glycol (MIRALAX  / GLYCOLAX ) packet 17 g, 17 g, Oral, BID, Agarwala, Ravi, MD   polyvinyl alcohol  (LIQUIFILM TEARS) 1.4 % ophthalmic solution 1 drop, 1 drop, Both Eyes, PRN, Chavez, Abigail, NP, 1 drop at 12/23/23 2352   promethazine  (PHENERGAN ) tablet 12.5-25 mg, 12.5-25 mg, Oral, Q4H PRN, Johnanna Credit Caylin, PA-C, 25 mg at 12/20/23 9068   senna-docusate (Senokot-S) tablet 1 tablet, 1 tablet, Oral, BID, Agarwala, Ravi, MD, 1 tablet at 12/26/23 0836   sodium phosphate  (FLEET) enema 1 enema, 1 enema, Rectal, Once PRN, Tomlinson, Sara Caylin, PA-C   zolpidem  (AMBIEN ) tablet  5 mg, 5 mg, Oral, QHS PRN, Agarwala, Ravi, MD, 5 mg at 12/25/23 2159     Patients Current Diet:  Diet Order                  Diet Carb Modified Fluid consistency: Thin; Room service appropriate? Yes  Diet effective now                         Precautions / Restrictions Precautions Precautions: Fall Precaution Comments: s/p crani Restrictions Weight Bearing Restrictions Per Provider Order: No    Has the patient had 2 or more falls or a fall with injury in the past year? No   Prior Activity Level Community (5-7x/wk): fully indpendent, no DME, driving, retired from patent examiner, very active, outdoorsy   Prior Functional Level Self Care: Did the patient need help bathing, dressing, using the toilet or eating? Independent   Indoor Mobility: Did the patient need assistance with walking from room to room (with or without device)? Independent   Stairs: Did the patient need assistance with internal or external stairs (with or without device)? Independent   Functional Cognition: Did the patient need help planning regular tasks such as shopping or remembering to take medications? Independent   Patient Information Are you of Hispanic, Latino/a,or Spanish origin?: A. No, not of Hispanic, Latino/a, or Spanish origin What is your race?: B. Black or African American Do you need or want an interpreter to  communicate with a doctor or health care staff?: 0. No   Patient's Response To:  Health Literacy and Transportation Is the patient able to respond to health literacy and transportation needs?: Yes Health Literacy - How often do you need to have someone help you when you read instructions, pamphlets, or other written material from your doctor or pharmacy?: Never In the past 12 months, has lack of transportation kept you from medical appointments or from getting medications?: No In the past 12 months, has lack of transportation kept you from meetings, work, or from getting things needed for daily living?: No   Journalist, Newspaper / Equipment Home Equipment: Shower seat, Hand held shower head   Prior Device Use: Indicate devices/aids used by the patient prior to current illness, exacerbation or injury? None of the above   Current Functional Level Cognition   Overall Cognitive Status: Within Functional Limits for tasks assessed Current Attention Level: Selective Orientation Level: Oriented X4 Following Commands: Follows one step commands consistently Safety/Judgement: Decreased awareness of safety General Comments: occasional cue to attend to left, able to follow directions    Extremity Assessment (includes Sensation/Coordination)   Upper Extremity Assessment: Right hand dominant, LUE deficits/detail LUE Deficits / Details: pt noted to have scapula movement in all planes, shoulder shrug present, visual attention able to move L UE into elbow flexion ( bicep) decreased triceps. pt with digit flexion with decreased extension. AAROM for all elbow, wrist and digit movement LUE Sensation: decreased proprioception, decreased light touch LUE Coordination: decreased fine motor, decreased gross motor  Lower Extremity Assessment: Defer to PT evaluation LLE Deficits / Details: 2+/5 knee extension, 2/5 hip abd/add, 0/5 DF/PF LLE Sensation: decreased light touch LLE Coordination: decreased gross  motor, decreased fine motor     ADLs   Overall ADL's : Needs assistance/impaired Eating/Feeding: Set up Grooming: Contact guard assist, Standing Upper Body Bathing: Maximal assistance, Sitting Lower Body Bathing: Maximal assistance, Sit to/from stand Upper Body Dressing : Minimal assistance, Standing Upper Body Dressing Details (  indicate cue type and reason): gown for back Lower Body Dressing: Moderate assistance, Sitting/lateral leans Lower Body Dressing Details (indicate cue type and reason): assistance for donning shoes seated on EOB Toilet Transfer: Minimal assistance, Contact guard assist, BSC/3in1 General ADL Comments: performed toilet transfers and self care in bathroom with min to CGA     Mobility   Overal bed mobility: Needs Assistance Bed Mobility: Supine to Sit Rolling: Mod assist Sidelying to sit: Min assist Supine to sit: Min assist General bed mobility comments: increased time and assistance to scoot to EOB     Transfers   Overall transfer level: Needs assistance Equipment used: 1 person hand held assist Transfers: Sit to/from Stand Sit to Stand: Min assist, Contact guard assist Bed to/from chair/wheelchair/BSC transfer type:: Stand pivot Stand pivot transfers: Mod assist, +2 physical assistance Transfer via Lift Equipment: Stedy General transfer comment: min to CGA for sit to stands and balance for transfers, cues for hand placement     Ambulation / Gait / Stairs / Wheelchair Mobility   Ambulation/Gait Ambulation/Gait assistance: Editor, Commissioning (Feet): 120 Feet Assistive device: 1 person hand held assist Gait Pattern/deviations: Step-through pattern General Gait Details: initial incoordination on L LE improved with cuing to weak heel toe pattern, some drift and instability with scanning.  L calf pain much improved and less disruptive to gait. Gait velocity: decr Gait velocity interpretation: <1.8 ft/sec, indicate of risk for recurrent falls Pre-gait  activities: worked on w/shifts, unweighting for sidestepping, turning 360 deg min mod assist.  Notable more complex tasks throwing off coordination L side.     Posture / Balance Dynamic Sitting Balance Sitting balance - Comments: able to assist with donning shoes seated on EOB Balance Overall balance assessment: Needs assistance Sitting-balance support: Single extremity supported, Feet supported Sitting balance-Leahy Scale: Fair Sitting balance - Comments: able to assist with donning shoes seated on EOB Postural control: Left lateral lean Standing balance support: Single extremity supported, During functional activity Standing balance-Leahy Scale: Fair Standing balance comment: able to stand at sink for hand hygiene with no UE support     Special needs/care consideration CPAP and Skin surgical incision/crani    Previous Home Environment (from acute therapy documentation) Living Arrangements: Spouse/significant other Available Help at Discharge: Family, Available 24 hours/day Type of Home: House Home Layout: Two level, Able to live on main level with bedroom/bathroom Alternate Level Stairs-Rails: Left Alternate Level Stairs-Number of Steps: 14 Home Access: Level entry Bathroom Shower/Tub: Health Visitor: Handicapped height Bathroom Accessibility: Yes How Accessible: Accessible via walker Home Care Services: No Additional Comments: goes by DJ, 5 grandbabies   Discharge Living Setting Plans for Discharge Living Setting: Patient's home, Lives with (comment) (spouse) Type of Home at Discharge: House Discharge Home Layout: Able to live on main level with bedroom/bathroom Discharge Home Access: Level entry Discharge Bathroom Shower/Tub: Walk-in shower Discharge Bathroom Toilet: Handicapped height Discharge Bathroom Accessibility: Yes How Accessible: Accessible via walker Does the patient have any problems obtaining your medications?: No   Social/Family/Support  Systems Anticipated Caregiver: Adonna (spouse) Anticipated Caregiver's Contact Information: [phone] Ability/Limitations of Caregiver: none stated Caregiver Availability: 24/7 Discharge Plan Discussed with Primary Caregiver: Yes Is Caregiver In Agreement with Plan?: Yes Does Caregiver/Family have Issues with Lodging/Transportation while Pt is in Rehab?: No   Goals Patient/Family Goal for Rehab: PT/OT/SLP supervision Expected length of stay: 10-14 days Additional Information: Discharge plan: home with spoues to provide 24/7 Pt/Family Agrees to Admission and willing to participate: Yes Program Orientation  Provided & Reviewed with Pt/Caregiver Including Roles  & Responsibilities: Yes   Decrease burden of Care through IP rehab admission: n/a   Possible need for SNF placement upon discharge: not anticipated. Plan for discharge home with spouse 24/7.    Patient Condition:  I have reviewed medical records from [facility], spoken with CM, and patient and spouse. I discussed via phone for inpatient rehabilitation assessment.  Patient will benefit from ongoing PT, OT, and SLP, can actively participate in 3 hours of therapy a day 5 days of the week, and can make measurable gains during the admission.  Patient will also benefit from the coordinated team approach during an Inpatient Acute Rehabilitation admission.  The patient will receive intensive therapy as well as Rehabilitation physician, nursing, social worker, and care management interventions.  Due to safety, skin/wound care, disease management, medication administration, pain management, and patient education the patient requires 24 hour a day rehabilitation nursing.  The patient is currently min to mod assist with mobility and basic ADLs.  Discharge setting and therapy post discharge at  home  is anticipated.  Patient has agreed to participate in the Acute Inpatient Rehabilitation Program and will admit today.     Preadmission Screen Completed  By:  Reche FORBES Lowers, PT, DPT 12/26/2023 11:16 AM ______________________________________________________________________   Discussed status with Dr. Lovorn on 12/26/23 at 11:16 AM  and received approval for admission today.   Admission Coordinator:  Shweta Aman E Candler Ginsberg, PT, DPT time 11:16 AM Pattricia 12/26/23     Assessment/Plan: Diagnosis:  R brain mass s/p crani Does the need for close, 24 hr/day Medical supervision in concert with the patient's rehab needs make it unreasonable for this patient to be served in a less intensive setting? Yes Co-Morbidities requiring supervision/potential complications: L hemiparesis; DVT on Elqiuis;  Due to bladder management, bowel management, safety, skin/wound care, disease management, medication administration, pain management, and patient education, does the patient require 24 hr/day rehab nursing? Yes Does the patient require coordinated care of a physician, rehab nurse, PT, OT, and SLP to address physical and functional deficits in the context of the above medical diagnosis(es)? Yes Addressing deficits in the following areas: balance, endurance, locomotion, strength, transferring, bowel/bladder control, bathing, dressing, feeding, grooming, toileting, cognition, speech, and language Can the patient actively participate in an intensive therapy program of at least 3 hrs of therapy 5 days a week? Yes The potential for patient to make measurable gains while on inpatient rehab is good Anticipated functional outcomes upon discharge from inpatient rehab: supervision PT, supervision OT, supervision SLP Estimated rehab length of stay to reach the above functional goals is: 10-14 days Anticipated discharge destination: Home 10. Overall Rehab/Functional Prognosis: good     MD Signature:            Revision History

## 2023-12-27 DIAGNOSIS — C719 Malignant neoplasm of brain, unspecified: Secondary | ICD-10-CM | POA: Diagnosis not present

## 2023-12-27 LAB — COMPREHENSIVE METABOLIC PANEL
ALT: 44 U/L (ref 0–44)
AST: 21 U/L (ref 15–41)
Albumin: 2.6 g/dL — ABNORMAL LOW (ref 3.5–5.0)
Alkaline Phosphatase: 38 U/L (ref 38–126)
Anion gap: 7 (ref 5–15)
BUN: 14 mg/dL (ref 6–20)
CO2: 24 mmol/L (ref 22–32)
Calcium: 8.6 mg/dL — ABNORMAL LOW (ref 8.9–10.3)
Chloride: 107 mmol/L (ref 98–111)
Creatinine, Ser: 0.94 mg/dL (ref 0.61–1.24)
GFR, Estimated: >60 mL/min (ref >60)
Glucose, Bld: 93 mg/dL (ref 70–99)
Potassium: 3.8 mmol/L (ref 3.5–5.1)
Sodium: 138 mmol/L (ref 135–145)
Total Bilirubin: 0.8 mg/dL (ref 0.0–1.2)
Total Protein: 5.5 g/dL — ABNORMAL LOW (ref 6.5–8.1)

## 2023-12-27 LAB — CBC WITH DIFFERENTIAL/PLATELET
Abs Immature Granulocytes: 0.13 10*3/uL — ABNORMAL HIGH (ref 0.00–0.07)
Basophils Absolute: 0 10*3/uL (ref 0.0–0.1)
Basophils Relative: 0 %
Eosinophils Absolute: 0.1 10*3/uL (ref 0.0–0.5)
Eosinophils Relative: 1 %
HCT: 51.4 % (ref 39.0–52.0)
Hemoglobin: 17 g/dL (ref 13.0–17.0)
Immature Granulocytes: 1 %
Lymphocytes Relative: 22 %
Lymphs Abs: 2.4 10*3/uL (ref 0.7–4.0)
MCH: 27.2 pg (ref 26.0–34.0)
MCHC: 33.1 g/dL (ref 30.0–36.0)
MCV: 82.2 fL (ref 80.0–100.0)
Monocytes Absolute: 0.9 10*3/uL (ref 0.1–1.0)
Monocytes Relative: 8 %
Neutro Abs: 7.7 10*3/uL (ref 1.7–7.7)
Neutrophils Relative %: 68 %
Platelets: 194 10*3/uL (ref 150–400)
RBC: 6.25 MIL/uL — ABNORMAL HIGH (ref 4.22–5.81)
RDW: 18 % — ABNORMAL HIGH (ref 11.5–15.5)
WBC: 11.3 10*3/uL — ABNORMAL HIGH (ref 4.0–10.5)
nRBC: 0 % (ref 0.0–0.2)

## 2023-12-27 LAB — GLUCOSE, CAPILLARY
Glucose-Capillary: 82 mg/dL (ref 70–99)
Glucose-Capillary: 125 mg/dL — ABNORMAL HIGH (ref 70–99)
Glucose-Capillary: 120 mg/dL — ABNORMAL HIGH (ref 70–99)

## 2023-12-27 MED ORDER — POLYETHYLENE GLYCOL 3350 17 G PO PACK: 17.0000 g | PACK | Freq: Every day | ORAL | Status: DC | PRN

## 2023-12-27 NOTE — Progress Notes (Signed)
 Patient ID: Nicholas Stevens, male   DOB: 1971-08-23, 53 y.o.   MRN: [id number]  SW made efforts to meet with ot but pt in PT session. SW briefly introduced self to pt and his wife. SW will follow-up to complete assessment.   Graeme Jude, MSW, LCSW Office: [phone] Cell: [phone] Fax: [phone]

## 2023-12-27 NOTE — Evaluation (Signed)
 Occupational Therapy Assessment and Plan  Patient Details  Name: Nicholas Stevens MRN: [id number] Date of Birth: 1971/04/22  OT Diagnosis: cognitive deficits, disturbance of vision, hemiplegia affecting non-dominant side, and muscle weakness (generalized) Rehab Potential: Rehab Potential (ACUTE ONLY): Good ELOS: 3-5 days   Today's Date: 12/27/2023 OT Individual Time: 0847-1000 OT Individual Time Calculation (min): 73 min     Hospital Problem: Principal Problem:   Glioma of brain (HCC)   Past Medical History:  Past Medical History:  Diagnosis Date   DDD (degenerative disc disease), lumbar    HNP (herniated nucleus pulposus)    Hypertension    Liver hemangioma    Morbid obesity (HCC)    OSA (obstructive sleep apnea)    no cpap used since weight loss   Overweight(278.02)    Plantar fasciitis of right foot    Sleep apnea    Tinea barbae    Past Surgical History:  Past Surgical History:  Procedure Laterality Date   BREATH TEK H PYLORI N/A 08/20/2013   Procedure: BREATH TEK H PYLORI;  Surgeon: Donnice KATHEE Lunger, MD;  Location: [facility];  Service: General;  Laterality: N/A;   COLONOSCOPY WITH PROPOFOL  N/A 05/05/2022   Procedure: COLONOSCOPY WITH PROPOFOL ;  Surgeon: Albertus Gordy HERO, MD;  Location: [facility];  Service: Gastroenterology;  Laterality: N/A;   CRANIOTOMY Right 12/18/2023   Procedure: Right Frontal Stereotactic Craniotomy for Resection of Tumor;  Surgeon: Debby Dorn MATSU, MD;  Location: [facility] OR;  Service: Neurosurgery;  Laterality: Right;   ESOPHAGOGASTRODUODENOSCOPY (EGD) WITH PROPOFOL  N/A 03/15/2023   Procedure: ESOPHAGOGASTRODUODENOSCOPY (EGD) WITH PROPOFOL ;  Surgeon: Avram Lupita BRAVO, MD;  Location: [facility];  Service: Gastroenterology;  Laterality: N/A;   LAPAROSCOPIC APPENDECTOMY  2007   Dr Curvin   LAPAROSCOPIC GASTRIC SLEEVE RESECTION N/A 10/08/2013   Procedure: LAPAROSCOPIC SLEEVE GASTRECTOMY;  Surgeon: Donnice KATHEE Lunger, MD;  Location: [facility];  Service:  General;  Laterality: N/A;   LUMBAR LAMINECTOMY/DECOMPRESSION MICRODISCECTOMY N/A 09/16/2015   Procedure: MICRO LUMBAR DECOMPRESSION, MICRODISCECTOMY L5 - S1;  Surgeon: Reyes Billing, MD;  Location: [facility];  Service: Orthopedics;  Laterality: N/A;   POLYPECTOMY  05/05/2022   Procedure: POLYPECTOMY;  Surgeon: Albertus Gordy HERO, MD;  Location: [facility];  Service: Gastroenterology;;   ROTATOR CUFF REPAIR Right 2005   Dr Harden    Assessment & Plan Clinical Impression: Nicholas Stevens is a 53 year old male with history of OSA, HTN, obesity s/p gastric sleeve, liver hemangioma who was admitted on 12/15/23 with reports of decrease in coordination, minimal use of LUE/LLE and fall prompting treatment. MRI brain done revealing necrotic, hemorrhagic mass in superior right frontal lobe with features of glioblastoma and mass effect with very early right uncal herniation.  He underwent right frontal stereotactic  craniotomy for resection of frontal lobe tumor on 12/23 by Dr. Debby.  Post op has had improvement in LUE strength and follow up MRI showed good debulking of tumor with decrease in mass effect, collapse of lesion with region of enhancement  and 5 mm right to left shift. Pathology revealed high grade glioma grade 4 c/w glioblastoma. Patient transferred to CIR on 12/26/2023 .    Patient currently requires min with basic self-care skills secondary to unbalanced muscle activation and decreased coordination, decreased attention to left, and decreased standing balance, hemiplegia, and decreased balance strategies.  Prior to hospitalization, patient could complete BADL/IADL with independent .  Patient will benefit from skilled intervention to decrease level of assist with basic self-care skills  and increase independence with basic self-care skills prior to discharge home with care partner.  Anticipate patient will require intermittent supervision and follow up outpatient.  OT - End of Session Activity  Tolerance: Decreased this session Endurance Deficit: Yes OT Assessment Rehab Potential (ACUTE ONLY): Good OT Patient demonstrates impairments in the following area(s): Balance;Motor;Vision;Perception;Endurance;Safety OT Basic ADL's Functional Problem(s): Bathing;Dressing;Toileting OT Transfers Functional Problem(s): Toilet;Tub/Shower OT Additional Impairment(s): Fuctional Use of Upper Extremity OT Plan OT Intensity: Minimum of 1-2 x/day, 45 to 90 minutes OT Frequency: 5 out of 7 days OT Duration/Estimated Length of Stay: 3-5 days OT Treatment/Interventions: Balance/vestibular training;Disease mangement/prevention;Neuromuscular re-education;Patient/family education;Self Care/advanced ADL retraining;Therapeutic Exercise;UE/LE Coordination activities;Visual/perceptual remediation/compensation;UE/LE Strength taining/ROM;Therapeutic Activities;Functional mobility training;DME/adaptive equipment instruction;Discharge planning;Cognitive remediation/compensation OT Self Feeding Anticipated Outcome(s): Independent OT Basic Self-Care Anticipated Outcome(s): Supervision OT Toileting Anticipated Outcome(s): Supervision OT Bathroom Transfers Anticipated Outcome(s): Supervision OT Recommendation Recommendations for Other Services: Neuropsych consult Patient destination: Home Follow Up Recommendations: Outpatient OT Equipment Recommended: To be determined   OT Evaluation Precautions/Restrictions  Precautions Precautions: Fall Precaution Comments: s/p crani Restrictions Weight Bearing Restrictions Per Provider Order: No Pain Pain Assessment Pain Score: 0-No pain Home Living/Prior Functioning Home Living Living Arrangements: Spouse/significant other Available Help at Discharge: Family, Available 24 hours/day Type of Home: House Home Access: Level entry Home Layout: Two level, Able to live on main level with bedroom/bathroom Alternate Level Stairs-Number of Steps: 14 Alternate Level  Stairs-Rails: Left Bathroom Shower/Tub: Health Visitor: Handicapped height Additional Comments: goes by DJ, 5 grandbabies  Lives With: Spouse IADL History Current License: Yes Mode of Transportation: Car Education: [place]  [employer] Occupation: Full time employment Prior Function Level of Independence: Independent with basic ADLs Vocation: Full time employment Leisure: Hobbies-yes (Comment) Vision Baseline Vision/History: 1 Wears glasses Ability to See in Adequate Light: 0 Adequate Patient Visual Report: Eye fatigue/eye pain/headache Vision Assessment?: Yes Eye Alignment: Within Functional Limits Ocular Range of Motion: Within Functional Limits Alignment/Gaze Preference: Within Defined Limits Tracking/Visual Pursuits: Able to track stimulus in all quads without difficulty Additional Comments: Reports eye strain left eye with prolonged use of phone Perception  Perception: Impaired Perception-Other Comments: Mild L inattention Praxis Praxis: WFL Cognition Cognition Overall Cognitive Status: Within Functional Limits for tasks assessed Arousal/Alertness: Awake/alert Memory: Appears intact Attention: Selective Selective Attention: Impaired Selective Attention Impairment: Functional complex Awareness: Appears intact Problem Solving: Appears intact Safety/Judgment: Appears intact Brief Interview for Mental Status (BIMS) Repetition of Three Words (First Attempt): 3 Temporal Orientation: Year: Correct Temporal Orientation: Month: Accurate within 5 days Temporal Orientation: Day: Correct Recall: Sock: Yes, no cue required Recall: Blue: Yes, no cue required Recall: Bed: Yes, no cue required BIMS Summary Score: 15 Sensation Sensation Light Touch: Appears Intact Hot/Cold: Appears Intact Proprioception: Impaired by gross assessment Stereognosis: Not tested Additional Comments: Inattention left, limited spontaneous movement in LUE, e.g. with walking,  gesturing Coordination Gross Motor Movements are Fluid and Coordinated: No Fine Motor Movements are Fluid and Coordinated: No Motor  Motor Motor: Hemiplegia Motor - Skilled Clinical Observations: Very mild left hemparesis  Trunk/Postural Assessment  Cervical Assessment Cervical Assessment:  (stiffness in neck- more leftsided, slightly forward head) Thoracic Assessment Thoracic Assessment: Within Functional Limits Lumbar Assessment Lumbar Assessment: Within Functional Limits Postural Control Postural Control: Deficits on evaluation Righting Reactions: delayed Protective Responses: diminished left  Balance Balance Balance Assessed: Yes Static Sitting Balance Static Sitting - Balance Support: No upper extremity supported;Feet supported Static Sitting - Level of Assistance: 7: Independent Dynamic Sitting Balance Dynamic Sitting - Balance Support: No upper  extremity supported;Feet supported;During functional activity Dynamic Sitting - Level of Assistance: 7: Independent Static Standing Balance Static Standing - Balance Support: No upper extremity supported;During functional activity Static Standing - Level of Assistance: 5: Stand by assistance Dynamic Standing Balance Dynamic Standing - Balance Support: No upper extremity supported;During functional activity Dynamic Standing - Level of Assistance: 5: Stand by assistance Extremity/Trunk Assessment RUE Assessment RUE Assessment: Within Functional Limits General Strength Comments: 4+/5 LUE Assessment LUE Assessment: Exceptions to [facility] Active Range of Motion (AROM) Comments: 140* shoulder flexion, tension at top of range, uses shoulder elevation (vs depression) to raise arm General Strength Comments: 4-/5 LUE Body System: Neuro Brunstrum levels for arm and hand: Arm;Hand Brunstrum level for arm: Stage IV Movement is deviating from synergy Brunstrum level for hand: Stage V Independence from basic synergies  Care Tool Care Tool  Self Care Eating   Eating Assist Level: Set up assist    Oral Care    Oral Care Assist Level: Set up assist    Bathing   Body parts bathed by patient: Right arm;Left arm;Chest;Abdomen;Front perineal area;Right upper leg;Left upper leg;Face Body parts bathed by helper: Left lower leg;Right lower leg;Buttocks   Assist Level: Minimal Assistance - Patient > 75%    Upper Body Dressing(including orthotics)   What is the patient wearing?: Pull over shirt   Assist Level: Minimal Assistance - Patient > 75%    Lower Body Dressing (excluding footwear)   What is the patient wearing?: Pants Assist for lower body dressing: Minimal Assistance - Patient > 75%    Putting on/Taking off footwear   What is the patient wearing?: Socks;Shoes Assist for footwear: Minimal Assistance - Patient > 75%       Care Tool Toileting Toileting activity   Assist for toileting: Moderate Assistance - Patient 50 - 74%     Care Tool Bed Mobility Roll left and right activity   Roll left and right assist level: Supervision/Verbal cueing    Sit to lying activity   Sit to lying assist level: Supervision/Verbal cueing    Lying to sitting on side of bed activity   Lying to sitting on side of bed assist level: the ability to move from lying on the back to sitting on the side of the bed with no back support.: Supervision/Verbal cueing     Care Tool Transfers Sit to stand transfer   Sit to stand assist level: Contact Guard/Touching assist    Chair/bed transfer   Chair/bed transfer assist level: Contact Guard/Touching assist     Toilet transfer   Assist Level: Minimal Assistance - Patient > 75%     Care Tool Cognition  Expression of Ideas and Wants Expression of Ideas and Wants: 4. Without difficulty (complex and basic) - expresses complex messages without difficulty and with speech that is clear and easy to understand  Understanding Verbal and Non-Verbal Content Understanding Verbal and Non-Verbal Content:  4. Understands (complex and basic) - clear comprehension without cues or repetitions   Memory/Recall Ability Memory/Recall Ability : Current season;Location of own room;Staff names and faces;That he or she is in a hospital/hospital unit   Refer to Care Plan for Long Term Goals  SHORT TERM GOAL WEEK 1 OT Short Term Goal 1 (Week 1): STG=LTG due to LOS  Recommendations for other services: Neuropsych   Skilled Therapeutic Intervention:   Patient received supine in bed with wife at bedside.  Patient and wife extremely eager for therapy evaluation, and wife assisted patient to bathe and dress  prior to OT session.  Wife cleared to transfer patient in room, or take to bathroom.  Patient with mild inattention to left side, leftward drift with walking.  Patient with reduced spontaneous movement in left arm despite active movement present in all joints of LUE.   Patient walked to and from gym with contact guard to close supervision.  Patient left up in recliner with wife at his side and personal items nearby.   ADL ADL Eating: Set up Where Assessed-Eating: Edge of bed Grooming: Setup Where Assessed-Grooming: Edge of bed Upper Body Bathing: Minimal assistance Where Assessed-Upper Body Bathing: Edge of bed Lower Body Bathing: Minimal assistance Where Assessed-Lower Body Bathing: Edge of bed Upper Body Dressing: Minimal assistance Where Assessed-Upper Body Dressing: Edge of bed Lower Body Dressing: Minimal assistance Where Assessed-Lower Body Dressing: Edge of bed Toileting: Moderate assistance Where Assessed-Toileting: Teacher, Adult Education: Curator Method: Proofreader: Engineer, Technical Sales: Unable to assess Tub/Shower Transfer Method: Unable to assess Film/video Editor: Insurance Underwriter Method: Designer, Industrial/product: Shower seat with back Mobility  Bed Mobility Bed Mobility: Rolling  Right;Right Sidelying to [employer] Right: Supervision/verbal cueing Right Sidelying to Sit: Supervision/Verbal cueing Transfers Sit to Stand: Contact Guard/Touching assist Stand to Sit: Contact Guard/Touching assist   Discharge Criteria: Patient will be discharged from OT if patient refuses treatment 3 consecutive times without medical reason, if treatment goals not met, if there is a change in medical status, if patient makes no progress towards goals or if patient is discharged from hospital.  The above assessment, treatment plan, treatment alternatives and goals were discussed and mutually agreed upon: by patient and by family  Nicholas Stevens 12/27/2023, 1:06 PM

## 2023-12-27 NOTE — Plan of Care (Signed)
  Problem: RH Balance Goal: LTG Patient will maintain dynamic standing with ADLs (OT) Description: LTG:  Patient will maintain dynamic standing balance with assist during activities of daily living (OT)  Flowsheets (Taken 12/27/2023 1423) LTG: Pt will maintain dynamic standing balance during ADLs with: Independent   Problem: Sit to Stand Goal: LTG:  Patient will perform sit to stand in prep for activites of daily living with assistance level (OT) Description: LTG:  Patient will perform sit to stand in prep for activites of daily living with assistance level (OT) Flowsheets (Taken 12/27/2023 1423) LTG: PT will perform sit to stand in prep for activites of daily living with assistance level: Independent   Problem: RH Bathing Goal: LTG Patient will bathe all body parts with assist levels (OT) Description: LTG: Patient will bathe all body parts with assist levels (OT) Flowsheets (Taken 12/27/2023 1423) LTG: Pt will perform bathing with assistance level/cueing: Supervision/Verbal cueing LTG: Position pt will perform bathing: Shower   Problem: RH Dressing Goal: LTG Patient will perform upper body dressing (OT) Description: LTG Patient will perform upper body dressing with assist, with/without cues (OT). Flowsheets (Taken 12/27/2023 1423) LTG: Pt will perform upper body dressing with assistance level of: Independent Goal: LTG Patient will perform lower body dressing w/assist (OT) Description: LTG: Patient will perform lower body dressing with assist, with/without cues in positioning using equipment (OT) Flowsheets (Taken 12/27/2023 1423) LTG: Pt will perform lower body dressing with assistance level of: Independent   Problem: RH Toileting Goal: LTG Patient will perform toileting task (3/3 steps) with assistance level (OT) Description: LTG: Patient will perform toileting task (3/3 steps) with assistance level (OT)  Flowsheets (Taken 12/27/2023 1423) LTG: Pt will perform toileting task (3/3 steps) with  assistance level: Supervision/Verbal cueing   Problem: RH Functional Use of Upper Extremity Goal: LTG Patient will use RT/LT upper extremity as a (OT) Description: LTG: Patient will use right/left upper extremity as a stabilizer/gross assist/diminished/nondominant/dominant level with assist, with/without cues during functional activity (OT) Flowsheets (Taken 12/27/2023 1423) LTG: Use of upper extremity in functional activities: LUE as nondominant level LTG: Pt will use upper extremity in functional activity with assistance level of: Independent   Problem: RH Toilet Transfers Goal: LTG Patient will perform toilet transfers w/assist (OT) Description: LTG: Patient will perform toilet transfers with assist, with/without cues using equipment (OT) Flowsheets (Taken 12/27/2023 1423) LTG: Pt will perform toilet transfers with assistance level of: Supervision/Verbal cueing   Problem: RH Tub/Shower Transfers Goal: LTG Patient will perform tub/shower transfers w/assist (OT) Description: LTG: Patient will perform tub/shower transfers with assist, with/without cues using equipment (OT) Flowsheets (Taken 12/27/2023 1423) LTG: Pt will perform tub/shower stall transfers with assistance level of: Supervision/Verbal cueing LTG: Pt will perform tub/shower transfers from: Walk in shower   Problem: RH Attention Goal: LTG Patient will demonstrate this level of attention during functional activites (OT) Description: LTG:  Patient will demonstrate this level of attention during functional activites  (OT) Flowsheets (Taken 12/27/2023 1423) Patient will demonstrate this level of attention during functional activites: Alternating Patient will demonstrate above attention level in the following environment: Home LTG: Patient will demonstrate this level of attention during functional activites (OT): Supervision

## 2023-12-27 NOTE — Evaluation (Signed)
 Speech Language Pathology Assessment and Plan  Patient Details  Name: Nicholas Stevens MRN: [id number] Date of Birth: October 21, 1971   Today's Date: 12/27/2023 SLP Individual Time: 1005-1100 SLP Individual Time Calculation (min): 55 min   Hospital Problem: Principal Problem:   Glioma of brain [facility])  Past Medical History:  Past Medical History:  Diagnosis Date   DDD (degenerative disc disease), lumbar    HNP (herniated nucleus pulposus)    Hypertension    Liver hemangioma    Morbid obesity (HCC)    OSA (obstructive sleep apnea)    no cpap used since weight loss   Overweight(278.02)    Plantar fasciitis of right foot    Sleep apnea    Tinea barbae    Past Surgical History:  Past Surgical History:  Procedure Laterality Date   BREATH TEK H PYLORI N/A 08/20/2013   Procedure: BREATH TEK VEAR [name];  Surgeon: Donnice KATHEE Lunger, MD;  Location: [facility];  Service: General;  Laterality: N/A;   COLONOSCOPY WITH PROPOFOL  N/A 05/05/2022   Procedure: COLONOSCOPY WITH PROPOFOL ;  Surgeon: Albertus Gordy HERO, MD;  Location: [facility];  Service: Gastroenterology;  Laterality: N/A;   CRANIOTOMY Right 12/18/2023   Procedure: Right Frontal Stereotactic Craniotomy for Resection of Tumor;  Surgeon: Debby Dorn MATSU, MD;  Location: [facility] OR;  Service: Neurosurgery;  Laterality: Right;   ESOPHAGOGASTRODUODENOSCOPY (EGD) WITH PROPOFOL  N/A 03/15/2023   Procedure: ESOPHAGOGASTRODUODENOSCOPY (EGD) WITH PROPOFOL ;  Surgeon: Avram Lupita BRAVO, MD;  Location: [facility];  Service: Gastroenterology;  Laterality: N/A;   LAPAROSCOPIC APPENDECTOMY  2007   Dr Curvin   LAPAROSCOPIC GASTRIC SLEEVE RESECTION N/A 10/08/2013   Procedure: LAPAROSCOPIC SLEEVE GASTRECTOMY;  Surgeon: Donnice KATHEE Lunger, MD;  Location: [facility];  Service: General;  Laterality: N/A;   LUMBAR LAMINECTOMY/DECOMPRESSION MICRODISCECTOMY N/A 09/16/2015   Procedure: MICRO LUMBAR DECOMPRESSION, MICRODISCECTOMY L5 - S1;  Surgeon: Reyes Billing, MD;   Location: [facility];  Service: Orthopedics;  Laterality: N/A;   POLYPECTOMY  05/05/2022   Procedure: POLYPECTOMY;  Surgeon: Albertus Gordy HERO, MD;  Location: [facility];  Service: Gastroenterology;;   ROTATOR CUFF REPAIR Right 2005   Dr Harden    Assessment / Plan / Recommendation Clinical Impression HPI: Pt is a 53 y/o male with PMH of obesity and OSA who was admitted to [facility] on 12/20 with decreased response time, L hemiparesis, 8/10 HA. He did have a fall the day of admission without injury. In ED he was hemodynamically stable. Labs notable for Hgb 17.3, but otherwise not concerning. Imaging revealed R superior frontal lobe mass with surrounding vasogenic edema, suspicious for glioblastoma. Neurosurgery was consulted and recommended Decadron , metastatic workup, tight BP control, and surgical resection, which was performed by DDr. Debby on 12/23. CT chest/abdomen/pelvis showed no concern for metastatic disease.   Clinical Impression:  Patient was evaluated via the Cognitive Linguistic Quick Test to assess cognitive linguistic skills. Patient scored WFL on all subtest of the evaluation indicative of functional awareness, attention, problem solving, memory, and language. Patient able to verbalize physical changes since admission and patient/wife report patient is at baseline cognitively. No further ST services warranted. Please re-consult SLP if change in status occurs. SLP will sign off.    Skilled Therapeutic Interventions          Patient evaluated using a standardized cognitive linguistic assessment and bedside swallow evaluation to assess current cognitive, communicative and swallowing function. See above for details.    SLP Assessment  Patient does not need any further Speech  [place] [facility] Services    Recommendations  Patient destination: Home Follow up Recommendations: None Equipment Recommended: None recommended by SLP     Pain None  SLP Evaluation Cognition Overall Cognitive  Status: Within Functional Limits for tasks assessed Arousal/Alertness: Awake/alert Orientation Level: Oriented X4 Year: 2025 Month: January Day of Week: Correct Memory: Appears intact Awareness: Appears intact Problem Solving: Appears intact Safety/Judgment: Appears intact  Comprehension Auditory Comprehension Overall Auditory Comprehension: Appears within functional limits for tasks assessed Expression Expression Primary Mode of Expression: Verbal Verbal Expression Overall Verbal Expression: Appears within functional limits for tasks assessed Written Expression Dominant Hand: Right Oral Motor Oral Motor/Sensory Function Overall Oral Motor/Sensory Function: Within functional limits Motor Speech Overall Motor Speech: Appears within functional limits for tasks assessed  Care Tool Care Tool Cognition Ability to hear (with hearing aid or hearing appliances if normally used Ability to hear (with hearing aid or hearing appliances if normally used): 0. Adequate - no difficulty in normal conservation, social interaction, listening to TV   Expression of Ideas and Wants Expression of Ideas and Wants: 4. Without difficulty (complex and basic) - expresses complex messages without difficulty and with speech that is clear and easy to understand   Understanding Verbal and Non-Verbal Content Understanding Verbal and Non-Verbal Content: 4. Understands (complex and basic) - clear comprehension without cues or repetitions  Memory/Recall Ability Memory/Recall Ability : Current season;Location of own room;Staff names and faces;That he or she is in a hospital/hospital unit    Refer to Care Plan for Long Term Goals  Recommendations for other services: None   Discharge Criteria: Patient will be discharged from SLP if patient refuses treatment 3 consecutive times without medical reason, if treatment goals not met, if there is a change in medical status, if patient makes no progress towards goals or if  patient is discharged from hospital.  The above assessment, treatment plan, treatment alternatives and goals were discussed and mutually agreed upon: by patient and by family  Narcisa Ganesh M.A., CF-SLP 12/27/2023, 12:13 PM

## 2023-12-27 NOTE — Evaluation (Signed)
 Physical Therapy Assessment and Plan  Patient Details  Name: Nicholas Stevens MRN: [id number] Date of Birth: 09-04-1971  PT Diagnosis: Abnormality of gait, Difficulty walking, Hemiplegia non-dominant, and Muscle weakness Rehab Potential: Excellent ELOS: 3-5 days   Today's Date: 12/27/2023 PT Individual Time: 1300-1415 PT Individual Time Calculation (min): 75 min    Hospital Problem: Principal Problem:   Glioma of brain (HCC)   Past Medical History:  Past Medical History:  Diagnosis Date   DDD (degenerative disc disease), lumbar    HNP (herniated nucleus pulposus)    Hypertension    Liver hemangioma    Morbid obesity (HCC)    OSA (obstructive sleep apnea)    no cpap used since weight loss   Overweight(278.02)    Plantar fasciitis of right foot    Sleep apnea    Tinea barbae    Past Surgical History:  Past Surgical History:  Procedure Laterality Date   BREATH TEK H PYLORI N/A 08/20/2013   Procedure: BREATH TEK H PYLORI;  Surgeon: Donnice KATHEE Lunger, MD;  Location: [facility];  Service: General;  Laterality: N/A;   COLONOSCOPY WITH PROPOFOL  N/A 05/05/2022   Procedure: COLONOSCOPY WITH PROPOFOL ;  Surgeon: Albertus Gordy HERO, MD;  Location: [facility];  Service: Gastroenterology;  Laterality: N/A;   CRANIOTOMY Right 12/18/2023   Procedure: Right Frontal Stereotactic Craniotomy for Resection of Tumor;  Surgeon: Debby Dorn MATSU, MD;  Location: [facility];  Service: Neurosurgery;  Laterality: Right;   ESOPHAGOGASTRODUODENOSCOPY (EGD) WITH PROPOFOL  N/A 03/15/2023   Procedure: ESOPHAGOGASTRODUODENOSCOPY (EGD) WITH PROPOFOL ;  Surgeon: Avram Lupita BRAVO, MD;  Location: [facility];  Service: Gastroenterology;  Laterality: N/A;   LAPAROSCOPIC APPENDECTOMY  2007   Dr Curvin   LAPAROSCOPIC GASTRIC SLEEVE RESECTION N/A 10/08/2013   Procedure: LAPAROSCOPIC SLEEVE GASTRECTOMY;  Surgeon: Donnice KATHEE Lunger, MD;  Location: [facility];  Service: General;  Laterality: N/A;   LUMBAR  LAMINECTOMY/DECOMPRESSION MICRODISCECTOMY N/A 09/16/2015   Procedure: MICRO LUMBAR DECOMPRESSION, MICRODISCECTOMY L5 - S1;  Surgeon: Reyes Billing, MD;  Location: [facility];  Service: Orthopedics;  Laterality: N/A;   POLYPECTOMY  05/05/2022   Procedure: POLYPECTOMY;  Surgeon: Albertus Gordy HERO, MD;  Location: [facility];  Service: Gastroenterology;;   ROTATOR CUFF REPAIR Right 2005   Dr Harden    Assessment & Plan Clinical Impression: Patient is a 53 year old male with history of OSA, HTN, Tinea barbae, obesity s/p gastric sleeve, liver hemangioma who was admitted on 12/15/23 with reports of decrease in coordination, minimal use of LUE/LLE and fall prompting treatment. MRI brain done revealing necrotic, hemorrhagic mass in superior right frontal lobe with features of glioblastoma and mass effect with very early right uncal herniation. Dr. Debby evaluated patient and recommended IV decadron  to reduce edema prior to resective surgery, SBP < 150 as well as metastatic work up. CT chest, abdomen, pelvis was negative for metastatic disease but showed giant right cavernous hemangioma with  exophytic left hepatic lobe hemangioma similar to MRI 10/2013. Follow up MRI brain 12/21 showed unchanged size of 4.7 X 3.8 X 4.8 cm mass with large vasogenic edema and 8 mm leftward shift.    Steroid induced hyperglycemia managed with novolog  and SSI. He underwent right frontal stereotactic  craniotomy for resection of frontal lobe tumor on 12/23 by Dr. Debby.  Post op has had improvement in LUE strength and follow up MRI showed good debulking of tumor with decrease in mass effect, collapse of lesion with region of enhancement  and 5 mm right to  left shift. Pathology revealed high grade glioma grade 4 c/w glioblastoma. Was discussed by tumor board yesterday? Decadron  wean completed today. He has had improvement in left sided weakness as reported to have flaccid hemiplegia at admission. Therapy has been working with patient and he  reported severe left calf pain with mobility on 12/22/23.    He was found to have calf tenderness with positive Hoffman's and doppler done revealed acute DVT in left peroneal veins. He is felt to be at high risk for clot propagation due to coagulopathy and was started on IV heparin  on 12/28 per NS input. He has been stable on this dose with follow up CT head 12/31 showing post op changes with expected small amount of bleed products in resection cavity and similar vasogenic edema with partial effacement of right lateral ventricle.  He continues to be limited by left sided weakness, left inattention, has balance deficits with decreased safety awareness and is requiring min assist overall. He was independent and working PTA.  CIR recommended due to functional decline.   Patient transferred to CIR on 12/26/2023 .   Patient currently requires  CGA  with mobility secondary to muscle weakness and muscle joint tightness, decreased cardiorespiratoy endurance, decreased initiation, decreased attention, decreased awareness, decreased problem solving, and decreased safety awareness, and decreased standing balance, hemiplegia, and decreased balance strategies.  Prior to hospitalization, patient was independent  with mobility and lived with Spouse in a House home.  Home access is  Level entry.  Patient will benefit from skilled PT intervention to maximize safe functional mobility, minimize fall risk, and decrease caregiver burden for planned discharge home with 24 hour supervision.  Anticipate patient will benefit from follow up OP at discharge.  PT - End of Session Activity Tolerance: Tolerates 10 - 20 min activity with multiple rests Endurance Deficit: Yes PT Assessment Rehab Potential (ACUTE/IP ONLY): Excellent PT Barriers to Discharge: Insurance for SNF coverage PT Patient demonstrates impairments in the following area(s): Balance;Endurance;Motor;Perception;Safety PT Transfers Functional Problem(s): Bed  Mobility;Car;Bed to Chair PT Locomotion Functional Problem(s): Ambulation;Stairs PT Plan PT Intensity: Minimum of 1-2 x/day ,45 to 90 minutes PT Frequency: 5 out of 7 days PT Duration Estimated Length of Stay: 3-5 days PT Treatment/Interventions: Ambulation/gait training;Cognitive remediation/compensation;Discharge planning;DME/adaptive equipment instruction;Functional mobility training;Pain management;Psychosocial support;Splinting/orthotics;Therapeutic Activities;UE/LE Strength taining/ROM;Visual/perceptual remediation/compensation;Balance/vestibular training;Community reintegration;Disease management/prevention;Functional electrical stimulation;Neuromuscular re-education;Patient/family education;Skin care/wound management;Stair training;Therapeutic Exercise;UE/LE Coordination activities;Wheelchair propulsion/positioning PT Transfers Anticipated Outcome(s): supervision PT Locomotion Anticipated Outcome(s): supervision PT Recommendation Recommendations for Other Services: Neuropsych consult Follow Up Recommendations: Outpatient PT;24 hour supervision/assistance Patient destination: Home Equipment Recommended: To be determined   PT Evaluation Precautions/Restrictions Precautions Precautions: Fall Precaution Comments: s/p crani; Lhemi (mild) Restrictions Weight Bearing Restrictions Per Provider Order: No Pain Pain Assessment Pain Score: 0-No pain Pain Interference Pain Interference Pain Effect on Sleep: 0. Does not apply - I have not had any pain or hurting in the past 5 days Pain Interference with Therapy Activities: 0. Does not apply - I have not received rehabilitationtherapy in the past 5 days Pain Interference with Day-to-Day Activities: 1. Rarely or not at all Home Living/Prior Functioning Home Living Available Help at Discharge: Family;Available 24 hours/day Type of Home: House Home Access: Level entry Home Layout: Two level;Able to live on main level with  bedroom/bathroom Alternate Level Stairs-Number of Steps: 14 Alternate Level Stairs-Rails: Left Bathroom Shower/Tub: Health Visitor: Handicapped height Bathroom Accessibility: Yes Additional Comments: goes by DJ, 5 grandbabies  Lives With: Spouse Prior Function Level of Independence: Independent with  basic ADLs;Independent with transfers;Independent with gait  Able to Take Stairs?: Yes Driving: Yes Vocation: Full time employment Leisure: Hobbies-yes (Comment) Vision/Perception  Vision - History Ability to See in Adequate Light: 0 Adequate Vision - Assessment Eye Alignment: Within Functional Limits Ocular Range of Motion: Within Functional Limits Alignment/Gaze Preference: Within Defined Limits Tracking/Visual Pursuits: Able to track stimulus in all quads without difficulty Saccades: Within functional limits Additional Comments: Reports eye strain left eye with prolonged use of phone Perception Perception: Impaired Preception Impairment Details: Inattention/Neglect Perception-Other Comments: Mild L inattention Praxis Praxis: WFL  Cognition Overall Cognitive Status: Within Functional Limits for tasks assessed Arousal/Alertness: Awake/alert Orientation Level: Oriented X4 Year: 2025 Month: January Day of Week: Correct Attention: Selective Selective Attention: Impaired Selective Attention Impairment: Functional complex Memory: Appears intact Awareness: Appears intact Problem Solving: Appears intact Safety/Judgment: Appears intact Sensation Sensation Light Touch: Appears Intact Hot/Cold: Appears Intact Proprioception: Impaired by gross assessment Stereognosis: Not tested Additional Comments: Inattention left, limited spontaneous movement in LUE, e.g. with walking, gesturing Coordination Gross Motor Movements are Fluid and Coordinated: No Fine Motor Movements are Fluid and Coordinated: No Motor  Motor Motor: Hemiplegia Motor - Skilled Clinical  Observations: Very mild left hemparesis   Trunk/Postural Assessment  Cervical Assessment Cervical Assessment: Within Functional Limits Thoracic Assessment Thoracic Assessment: Within Functional Limits Lumbar Assessment Lumbar Assessment: Within Functional Limits Postural Control Postural Control: Deficits on evaluation Righting Reactions: delayed Protective Responses: diminished left  Balance Balance Balance Assessed: Yes Standardized Balance Assessment Standardized Balance Assessment: Berg Balance Test;Functional Gait Assessment Berg Balance Test Sit to Stand: Able to stand without using hands and stabilize independently Standing Unsupported: Able to stand safely 2 minutes Sitting with Back Unsupported but Feet Supported on Floor or Stool: Able to sit safely and securely 2 minutes Stand to Sit: Sits safely with minimal use of hands Transfers: Able to transfer safely, definite need of hands Standing Unsupported with Eyes Closed: Unable to keep eyes closed 3 seconds but stays steady Standing Ubsupported with Feet Together: Able to place feet together independently and stand for 1 minute with supervision From Standing, Reach Forward with Outstretched Arm: Can reach forward >5 cm safely (2) From Standing Position, Pick up Object from Floor: Able to pick up shoe, needs supervision From Standing Position, Turn to Look Behind Over each Shoulder: Looks behind one side only/other side shows less weight shift Turn 360 Degrees: Able to turn 360 degrees safely but slowly Standing Unsupported, Alternately Place Feet on Step/Stool: Able to complete >2 steps/needs minimal assist Standing Unsupported, One Foot in Front: Able to take small step independently and hold 30 seconds Standing on One Leg: Tries to lift leg/unable to hold 3 seconds but remains standing independently Total Score: 37 Static Sitting Balance Static Sitting - Balance Support: No upper extremity supported;Feet  supported Static Sitting - Level of Assistance: 7: Independent Dynamic Sitting Balance Dynamic Sitting - Balance Support: No upper extremity supported;Feet supported;During functional activity Dynamic Sitting - Level of Assistance: 7: Independent Static Standing Balance Static Standing - Balance Support: No upper extremity supported;During functional activity Static Standing - Level of Assistance: 5: Stand by assistance Dynamic Standing Balance Dynamic Standing - Balance Support: No upper extremity supported;During functional activity Dynamic Standing - Level of Assistance: 5: Stand by assistance Functional Gait  Assessment Gait assessed : Yes Gait Level Surface: Walks 20 ft in less than 7 sec but greater than 5.5 sec, uses assistive device, slower speed, mild gait deviations, or deviates 6-10 in outside of the 12 in  walkway width. Change in Gait Speed: Able to change speed, demonstrates mild gait deviations, deviates 6-10 in outside of the 12 in walkway width, or no gait deviations, unable to achieve a major change in velocity, or uses a change in velocity, or uses an assistive device. Gait with Horizontal Head Turns: Performs head turns smoothly with slight change in gait velocity (eg, minor disruption to smooth gait path), deviates 6-10 in outside 12 in walkway width, or uses an assistive device. Gait with Vertical Head Turns: Performs task with slight change in gait velocity (eg, minor disruption to smooth gait path), deviates 6 - 10 in outside 12 in walkway width or uses assistive device Gait and Pivot Turn: Pivot turns safely in greater than 3 sec and stops with no loss of balance, or pivot turns safely within 3 sec and stops with mild imbalance, requires small steps to catch balance. Step Over Obstacle: Is able to step over one shoe box (4.5 in total height) but must slow down and adjust steps to clear box safely. May require verbal cueing. Gait with Narrow Base of Support: Ambulates less  than 4 steps heel to toe or cannot perform without assistance. Gait with Eyes Closed: Walks 20 ft, uses assistive device, slower speed, mild gait deviations, deviates 6-10 in outside 12 in walkway width. Ambulates 20 ft in less than 9 sec but greater than 7 sec. Ambulating Backwards: Walks 20 ft, uses assistive device, slower speed, mild gait deviations, deviates 6-10 in outside 12 in walkway width. Steps: Alternating feet, must use rail. Total Score: 17 Extremity Assessment  RUE Assessment RUE Assessment: Within Functional Limits General Strength Comments: 4+/5 LUE Assessment LUE Assessment: Exceptions to [facility] Active Range of Motion (AROM) Comments: 140* shoulder flexion, tension at top of range, uses shoulder elevation (vs depression) to raise arm General Strength Comments: 4-/5 LUE Body System: Neuro Brunstrum levels for arm and hand: Arm;Hand Brunstrum level for arm: Stage IV Movement is deviating from synergy Brunstrum level for hand: Stage V Independence from basic synergies RLE Assessment RLE Assessment: Within Functional Limits LLE Assessment LLE Assessment: Exceptions to [facility] General Strength Comments: Grossly 4/5  Care Tool Care Tool Bed Mobility Roll left and right activity   Roll left and right assist level: Supervision/Verbal cueing    Sit to lying activity   Sit to lying assist level: Supervision/Verbal cueing    Lying to sitting on side of bed activity   Lying to sitting on side of bed assist level: the ability to move from lying on the back to sitting on the side of the bed with no back support.: Supervision/Verbal cueing     Care Tool Transfers Sit to stand transfer   Sit to stand assist level: Contact Guard/Touching assist    Chair/bed transfer   Chair/bed transfer assist level: Contact Guard/Touching assist    Car transfer   Car transfer assist level: Contact Guard/Touching assist      Care Tool Locomotion Ambulation   Assist level: Contact  Guard/Touching assist Assistive device: No Device Max distance: 200  Walk 10 feet activity   Assist level: Contact Guard/Touching assist Assistive device: No Device   Walk 50 feet with 2 turns activity   Assist level: Contact Guard/Touching assist Assistive device: No Device  Walk 150 feet activity   Assist level: Contact Guard/Touching assist Assistive device: No Device  Walk 10 feet on uneven surfaces activity   Assist level: Minimal Assistance - Patient > 75%    Stairs   Assist  level: Contact Guard/Touching assist Stairs assistive device: 2 hand rails Max number of stairs: 12  Walk up/down 1 step activity   Walk up/down 1 step (curb) assist level: Contact Guard/Touching assist Walk up/down 1 step or curb assistive device: 2 hand rails  Walk up/down 4 steps activity   Walk up/down 4 steps assist level: Contact Guard/Touching assist Walk up/down 4 steps assistive device: 2 hand rails  Walk up/down 12 steps activity   Walk up/down 12 steps assist level: Contact Guard/Touching assist Walk up/down 12 steps assistive device: 2 hand rails  Pick up small objects from floor   Pick up small object from the floor assist level: Contact Guard/Touching assist    Wheelchair Is the patient using a wheelchair?: No          Wheel 50 feet with 2 turns activity      Wheel 150 feet activity        Refer to Care Plan for Long Term Goals  SHORT TERM GOAL WEEK 1 PT Short Term Goal 1 (Week 1): STG = LTG  Recommendations for other services: Neuropsych  Skilled Therapeutic Intervention Mobility Bed Mobility Bed Mobility: Rolling Right;Right Sidelying to Sit Rolling Right: Supervision/verbal cueing Right Sidelying to Sit: Supervision/Verbal cueing Transfers Transfers: Sit to Stand;Stand to Sit Sit to Stand: Contact Guard/Touching assist Stand to Sit: Contact Guard/Touching assist Transfer (Assistive device): None Locomotion  Gait Ambulation: Yes Gait Assistance: Contact  Guard/Touching assist Gait Distance (Feet): 200 Feet Assistive device: None Gait Gait: Yes Gait Pattern: Impaired Gait Pattern: Step-through pattern;Wide base of support (L drift) Stairs / Additional Locomotion Stairs: Yes Stairs Assistance: Contact Guard/Touching assist Stair Management Technique: Two rails;Alternating pattern;Forwards Number of Stairs: 12 Height of Stairs: 6 Ramp: Contact Guard/touching assist Curb: Contact Guard/Touching assist Wheelchair Mobility Wheelchair Mobility: No    Skilled Treatment: Pt sitting in recliner to start. Wife present throughout session. Patient pleasant and cooperative, oriented x4 and no cognitive deficits noted. Strength mildly weaker on LLE compared to his R. He presents with deficits primarily in balance and coordination > strength, with some mild L inattention as well. Completed functional outcome measures to assess falls risk. See above for details.   BERG 37/56  FGA 17/30  5xSTS 14s  TUG 13.5s   Completed Nustep for x10 minutes at L10 resistance using BUE/BLE with emphasis on reciprocal movement, full AROM bilaterally, and keeping cadence > 40spm.   Dynamic balance completed while standing on inverse bosu ball in // bars - challenged with unsupported standing, eyes closed standing, and mini squats (2x10) on inverse bosu. MinA for higher level dynamic standing tasks.   Pt returned to his room at end of treatment and left sitting upright in recliner with family and visitors in the room. All needs met.     Discharge Criteria: Patient will be discharged from PT if patient refuses treatment 3 consecutive times without medical reason, if treatment goals not met, if there is a change in medical status, if patient makes no progress towards goals or if patient is discharged from hospital.  The above assessment, treatment plan, treatment alternatives and goals were discussed and mutually agreed upon: by patient and by family  Sherlean SHAUNNA Perks  PT, DPT, CSRS  12/27/2023, 1:53 PM

## 2023-12-27 NOTE — Progress Notes (Signed)
 PROGRESS NOTE   Subjective/Complaints:  No acute complaints. No events overnight.  Mild bradycardia 50s overnight, resolved this AM. AM labs stable.  Continent b/b, LBM 12/26, medium. PRN Ambien  ovenright  ROS: Denies fevers, chills, N/V, abdominal pain, constipation, diarrhea, SOB, cough, chest pain, new weakness or paraesthesias.    Objective:   CT HEAD WO CONTRAST ( ) Result Date: 12/26/2023 CLINICAL DATA:  Brain/CNS neoplasm, monitor. EXAM: CT HEAD WITHOUT CONTRAST TECHNIQUE: Contiguous axial images were obtained from the base of the skull through the vertex without intravenous contrast. RADIATION DOSE REDUCTION: This exam was performed according to the departmental dose-optimization program which includes automated exposure control, adjustment of the mA and/or kV according to patient size and/or use of iterative reconstruction technique. COMPARISON:  Head CT 12/15/2023.  MRI brain 12/19/2023. FINDINGS: Brain: Postoperative changes of right frontal craniotomy for resection of a mass centered within the right superior frontal gyrus. Expected small amount of blood products in the resection cavity. Similar extent of surrounding vasogenic edema with partial effacement of the right lateral ventricle. No hydrocephalus, extra-axial collection or midline shift. Vascular: No hyperdense vessel or unexpected calcification. Skull: Right frontal craniotomy. Sinuses/Orbits: No acute finding. Other: None. IMPRESSION: Postoperative changes of right frontal craniotomy for resection of a mass centered within the right superior frontal gyrus. Expected small amount of blood products in the resection cavity. Similar extent of surrounding vasogenic edema with partial effacement of the right lateral ventricle. Electronically Signed   By: Ryan Chess M.D.   On: 12/26/2023 09:13   Recent Labs    12/26/23 0641 12/27/23 0634  WBC 11.4* 11.3*  HGB 17.5*  17.0  HCT 53.3* 51.4  PLT 214 194   Recent Labs    12/27/23 0634  NA 138  K 3.8  CL 107  CO2 24  GLUCOSE 93  BUN 14  CREATININE 0.94  CALCIUM 8.6*   No intake or output data in the 24 hours ending 12/27/23 0929      Physical Exam: Vital Signs Blood pressure 117/70, pulse 73, temperature 98.1 F (36.7 C), resp. rate 18, height 6' 2 (1.88 m), weight 120.9 kg, SpO2 95%. Physical Exam Constitutional: No apparent distress. Appropriate appearance for age.  Sitting up at bedside. HENT: No JVD. Neck Supple. Trachea midline. + Left hemicrani Eyes: PERRLA. EOMI. Visual fields grossly intact.  Cardiovascular: RRR, no murmurs/rub/gallops. No Edema. Peripheral pulses 2+  Respiratory: CTAB. No rales, rhonchi, or wheezing. On RA.  Abdomen: + bowel sounds, normoactive. No distention or tenderness.  Skin: C/D/I. No apparent lesions. Left scalp surgical staples intact, well-approximated, no drainage or dehiscence  MSK:      No apparent deformity.  Moving all 4 extremities with full active range of motion.       Neurologic exam:  Cognition: AAO to person, place, time and event.  Language: Fluent, No substitutions or neoglisms. No dysarthria.  Memory:  No apparent deficits  Insight: Good  insight into current condition.  Mood: Pleasant affect, appropriate mood.  Sensation: To light touch slightly reduced in distal right lower extremity; otherwise intact Reflexes: 2+ in BL UE and LEs. Negative Hoffman's and babinski signs bilaterally.  CN: 2-12 grossly intact.  Coordination: Extremely mild left upper extremity ataxia. Spasticity: MAS 0 in all extremities.  Strength: 5 out of 5 throughout, no hemiplegia noted on gross exam.          Assessment/Plan: 1. Functional deficits which require 3+ hours per day of interdisciplinary therapy in a comprehensive inpatient rehab setting. Physiatrist is providing close team supervision and 24 hour management of active medical problems listed  below. Physiatrist and rehab team continue to assess barriers to discharge/monitor patient progress toward functional and medical goals  Care Tool:  Bathing              Bathing assist       Upper Body Dressing/Undressing Upper body dressing        Upper body assist      Lower Body Dressing/Undressing Lower body dressing            Lower body assist       Toileting Toileting    Toileting assist       Transfers Chair/bed transfer  Transfers assist           Locomotion Ambulation   Ambulation assist              Walk 10 feet activity   Assist           Walk 50 feet activity   Assist           Walk 150 feet activity   Assist           Walk 10 feet on uneven surface  activity   Assist           Wheelchair     Assist               Wheelchair 50 feet with 2 turns activity    Assist            Wheelchair 150 feet activity     Assist          Blood pressure 117/70, pulse 73, temperature 98.1 F (36.7 C), resp. rate 18, height 6' 2 (1.88 m), weight 120.9 kg, SpO2 95%.  Medical Problem List and Plan: 1. Functional deficits secondary to right frontal glioblastoma s/p crani and resection             -patient may shower             -ELOS/Goals: 10-14 days, supervision goals with PT, OT, SLP  - stable to continue CIR  2.  Left peroneal DVT/Antithrombotics: -DVT/anticoagulation: on IV heparin  and to transition to DOAC today. -->  Eliquis  5 mg twice daily; hemoglobin stable on admission.             -antiplatelet therapy: N/A  3. Pain Management: Continue hydrocodone  prn.  4. Mood/Behavior/Sleep:LCSW to follow for evaluation and support.  --continue Ambien  for sleep. --Endorses sleeping well.             -antipsychotic agents: N/A   5. Neuropsych/cognition: This patient is capable of making decisions on his own behalf. 6. Skin/Wound Care: Monitor incision for healing. Routine pressure  relief measures.   -Postop day #9, remove staples tomorrow a.m. 1-2  7. Fluids/Electrolytes/Nutrition: Monitor I/O. Check CMET in am -A.m. labs stable.  8. Seizure prophylaxis: On Keppra  500 mg BID. Decadron  weaned off 12/31 am.   - 1/1: No headaches or neurodeficits off of Decadron ; monitor closely  9. Steroid induced hyperglycemia: Hgb A1C-5.4. has been on novolog  5 units TID-->will d/c and use SSI for  elevated BS 10.  HTN: Monitor BP TID--SBP goal < 150.  --Was on Dyazide  PTA-->resume--BP well controlled monitor for now     12/27/2023    6:27 AM 12/26/2023    7:29 PM 12/26/2023    2:14 PM  Vitals with BMI  Height   6' 2  Weight   266 lbs 9 oz  BMI   34.21  Systolic 117 124 858   141  Diastolic 70 73 86   86  Pulse 73 81 58   58    11. OSA: Continue CPAP--has been compliant at home also. 12. Constipation Continue Senna. Miralax  was added on 12/31 as last BM 12/28  -1-1: Increase senna to 2 tabs daily; MiraLAX  as needed per patient request  13. Leucocytosis: Likely reactive due to steroids. Monitor for fevers and other signs of infection. Stable 11 on admission.  14. Polycythemia: Hgb 17.3 - stable 1/1.  15. H/o Iron deficiency/ S/p gastric sleeve: Resume multivitamin.         LOS: 1 days A FACE TO FACE EVALUATION WAS PERFORMED  Nicholas Stevens 12/27/2023, 9:29 AM

## 2023-12-27 NOTE — Plan of Care (Signed)
  Problem: RH Balance Goal: LTG Patient will maintain dynamic standing balance (PT) Description: LTG:  Patient will maintain dynamic standing balance with assistance during mobility activities (PT) Flowsheets (Taken 12/27/2023 1356) LTG: Pt will maintain dynamic standing balance during mobility activities with:: Supervision/Verbal cueing   Problem: Sit to Stand Goal: LTG:  Patient will perform sit to stand with assistance level (PT) Description: LTG:  Patient will perform sit to stand with assistance level (PT) Flowsheets (Taken 12/27/2023 1356) LTG: PT will perform sit to stand in preparation for functional mobility with assistance level: Independent   Problem: RH Bed Mobility Goal: LTG Patient will perform bed mobility with assist (PT) Description: LTG: Patient will perform bed mobility with assistance, with/without cues (PT). Flowsheets (Taken 12/27/2023 1356) LTG: Pt will perform bed mobility with assistance level of: Independent   Problem: RH Bed to Chair Transfers Goal: LTG Patient will perform bed/chair transfers w/assist (PT) Description: LTG: Patient will perform bed to chair transfers with assistance (PT). Flowsheets (Taken 12/27/2023 1356) LTG: Pt will perform Bed to Chair Transfers with assistance level: Supervision/Verbal cueing   Problem: RH Car Transfers Goal: LTG Patient will perform car transfers with assist (PT) Description: LTG: Patient will perform car transfers with assistance (PT). Flowsheets (Taken 12/27/2023 1356) LTG: Pt will perform car transfers with assist:: Supervision/Verbal cueing   Problem: RH Ambulation Goal: LTG Patient will ambulate in controlled environment (PT) Description: LTG: Patient will ambulate in a controlled environment, # of feet with assistance (PT). Flowsheets (Taken 12/27/2023 1356) LTG: Pt will ambulate in controlled environ  assist needed:: Supervision/Verbal cueing LTG: Ambulation distance in controlled environment: 150 Goal: LTG Patient  will ambulate in home environment (PT) Description: LTG: Patient will ambulate in home environment, # of feet with assistance (PT). Flowsheets (Taken 12/27/2023 1356) LTG: Pt will ambulate in home environ  assist needed:: Supervision/Verbal cueing LTG: Ambulation distance in home environment: 50   Problem: RH Stairs Goal: LTG Patient will ambulate up and down stairs w/assist (PT) Description: LTG: Patient will ambulate up and down # of stairs with assistance (PT) Flowsheets (Taken 12/27/2023 1356) LTG: Pt will ambulate up/down stairs assist needed:: Supervision/Verbal cueing LTG: Pt will  ambulate up and down number of stairs: at least 12

## 2023-12-28 DIAGNOSIS — C711 Malignant neoplasm of frontal lobe: Principal | ICD-10-CM

## 2023-12-28 DIAGNOSIS — R531 Weakness: Secondary | ICD-10-CM

## 2023-12-28 LAB — BASIC METABOLIC PANEL
Anion gap: 6 (ref 5–15)
BUN: 13 mg/dL (ref 6–20)
CO2: 27 mmol/L (ref 22–32)
Calcium: 8 mg/dL — ABNORMAL LOW (ref 8.9–10.3)
Chloride: 104 mmol/L (ref 98–111)
Creatinine, Ser: 1.05 mg/dL (ref 0.61–1.24)
GFR, Estimated: >60 mL/min (ref >60)
Glucose, Bld: 98 mg/dL (ref 70–99)
Potassium: 3.8 mmol/L (ref 3.5–5.1)
Sodium: 137 mmol/L (ref 135–145)

## 2023-12-28 LAB — GLUCOSE, CAPILLARY
Glucose-Capillary: 101 mg/dL — ABNORMAL HIGH (ref 70–99)
Glucose-Capillary: 109 mg/dL — ABNORMAL HIGH (ref 70–99)
Glucose-Capillary: 100 mg/dL — ABNORMAL HIGH (ref 70–99)
Glucose-Capillary: 136 mg/dL — ABNORMAL HIGH (ref 70–99)

## 2023-12-28 LAB — CBC
HCT: 50.6 % (ref 39.0–52.0)
Hemoglobin: 16.4 g/dL (ref 13.0–17.0)
MCH: 26.8 pg (ref 26.0–34.0)
MCHC: 32.4 g/dL (ref 30.0–36.0)
MCV: 82.7 fL (ref 80.0–100.0)
Platelets: 193 10*3/uL (ref 150–400)
RBC: 6.12 MIL/uL — ABNORMAL HIGH (ref 4.22–5.81)
RDW: 18 % — ABNORMAL HIGH (ref 11.5–15.5)
WBC: 8.2 10*3/uL (ref 4.0–10.5)
nRBC: 0 % (ref 0.0–0.2)

## 2023-12-28 LAB — VITAMIN D 25 HYDROXY (VIT D DEFICIENCY, FRACTURES): Vit D, 25-Hydroxy: 41.24 ng/mL (ref 30–100)

## 2023-12-28 MED ORDER — LIDOCAINE HCL URETHRAL/MUCOSAL 2 % EX GEL
1.0000 | Freq: Once | CUTANEOUS | Status: AC
  Administered 2023-12-28: 1 via TOPICAL
  Filled 2023-12-28: qty 6

## 2023-12-28 MED ORDER — DIPHENHYDRAMINE HCL 25 MG PO CAPS
25.0000 mg | ORAL_CAPSULE | Freq: Once | ORAL | Status: AC
  Administered 2023-12-28: 25 mg via ORAL
  Filled 2023-12-28: qty 1

## 2023-12-28 MED ORDER — NON FORMULARY: 60.0000 mg | Freq: Two times a day (BID) | Status: DC

## 2023-12-28 MED ORDER — FEXOFENADINE HCL 60 MG PO TABS
60.0000 mg | ORAL_TABLET | Freq: Two times a day (BID) | ORAL | Status: DC
  Administered 2023-12-28 – 2023-12-31 (×6): 60 mg via ORAL
  Filled 2023-12-28 (×6): qty 1

## 2023-12-28 MED ORDER — CAMPHOR-MENTHOL 0.5-0.5 % EX LOTN: TOPICAL_LOTION | CUTANEOUS | Status: DC | PRN

## 2023-12-28 MED ORDER — TRIAMCINOLONE ACETONIDE 0.5 % EX CREA
TOPICAL_CREAM | Freq: Three times a day (TID) | CUTANEOUS | Status: DC
  Filled 2023-12-28: qty 15

## 2023-12-28 NOTE — Care Management (Signed)
 [facility] Individual Statement of Services  Patient Name:  Nicholas Stevens  Date:  12/28/2023  Welcome to the [facility].  Our goal is to provide you with an individualized program based on your diagnosis and situation, designed to meet your specific needs.  With this comprehensive rehabilitation program, you will be expected to participate in at least 3 hours of rehabilitation therapies Monday-Friday, with modified therapy programming on the weekends.  Your rehabilitation program will include the following services:  Physical Therapy (PT), Occupational Therapy (OT), Speech Therapy (ST), 24 hour per day rehabilitation nursing, Therapeutic Recreaction (TR), Psychology, Neuropsychology, Care Coordinator, Rehabilitation Medicine, Nutrition Services, Pharmacy Services, and Other  Weekly team conferences will be held on Tuesdays to discuss your progress.  Your Inpatient Rehabilitation Care Coordinator will talk with you frequently to get your input and to update you on team discussions.  Team conferences with you and your family in attendance may also be held.  Expected length of stay: 3-5 days    Overall anticipated outcome: Supervision  Depending on your progress and recovery, your program may change. Your Inpatient Rehabilitation Care Coordinator will coordinate services and will keep you informed of any changes. Your Inpatient Rehabilitation Care Coordinator's name and contact numbers are listed  below.  The following services may also be recommended but are not provided by the [facility]:  Driving Evaluations Home Health Rehabiltiation Services Outpatient Rehabilitation Services Vocational Rehabilitation   Arrangements will be made to provide these services after discharge if needed.  Arrangements include referral to agencies that provide these services.  Your insurance has been verified to be:  [employer]  Your primary doctor  is:  Music Therapist  Pertinent information will be shared with your doctor and your insurance company.  Inpatient Rehabilitation Care Coordinator:  [name] [phone] or [phone]  Information discussed with and copy given to patient by: [name], 12/28/2023, 9:13 AM

## 2023-12-28 NOTE — Progress Notes (Signed)
 PROGRESS NOTE   Subjective/Complaints:  No acute complaints. No events overnight.  Labs stable, vital stable. Per therapies, patient is meeting goals, discussing early discharge.  Wife is extremely concerned regarding ongoing need for support and left-sided inattention, weakness causing difficulty with ambulation.  ROS: Denies fevers, chills, N/V, abdominal pain, constipation, diarrhea, SOB, cough, chest pain, new weakness or paraesthesias.    Objective:   No results found.  Recent Labs    12/27/23 0634 12/28/23 0604  WBC 11.3* 8.2  HGB 17.0 16.4  HCT 51.4 50.6  PLT 194 193   Recent Labs    12/27/23 0634 12/28/23 0604  NA 138 137  K 3.8 3.8  CL 107 104  CO2 24 27  GLUCOSE 93 98  BUN 14 13  CREATININE 0.94 1.05  CALCIUM 8.6* 8.0*    Intake/Output Summary (Last 24 hours) at 12/28/2023 1518 Last data filed at 12/28/2023 0825 Gross per 24 hour  Intake 240 ml  Output --  Net 240 ml        Physical Exam: Vital Signs Blood pressure 110/68, pulse 67, temperature 98 F (36.7 C), resp. rate 18, height 6' 2 (1.88 m), weight 120.9 kg, SpO2 98%. Physical Exam Constitutional: No apparent distress. Appropriate appearance for age.  Sitting in chair. HENT: Mild left eyebrow droop.  + Left hemicrani Eyes: PERRLA. EOMI. Visual fields grossly intact.  Cardiovascular: RRR, no murmurs/rub/gallops. No Edema. Peripheral pulses 2+  Respiratory: CTAB. No rales, rhonchi, or wheezing. On RA.  Abdomen: + bowel sounds, normoactive. No distention or tenderness.  Skin:  Left scalp surgical staples intact, well-approximated, no drainage or dehiscence  MSK:      No apparent deformity.  Moving all 4 extremities with full active range of motion.       Neurologic exam:  Cognition: AAO to person, place, time and event.  Language: Fluent, No substitutions or neoglisms. No dysarthria.  Memory:  No apparent deficits  Insight: Good   insight into current condition.  Mood: Pleasant affect, appropriate mood.  Sensation: To light touch slightly reduced in distal right lower extremity; otherwise intact Reflexes: 2+ in BL UE and LEs. Negative Hoffman's and babinski signs bilaterally.  CN: 2-12 grossly intact.  Coordination: Extremely mild left upper extremity ataxia--ongoing Spasticity: MAS 0 in all extremities.  Strength: 5 out of 5 throughout, left-sided weakness with functional mobility but not apparent on gross exam.          Assessment/Plan: 1. Functional deficits which require 3+ hours per day of interdisciplinary therapy in a comprehensive inpatient rehab setting. Physiatrist is providing close team supervision and 24 hour management of active medical problems listed below. Physiatrist and rehab team continue to assess barriers to discharge/monitor patient progress toward functional and medical goals  Care Tool:  Bathing    Body parts bathed by patient: Right arm, Left arm, Chest, Abdomen, Front perineal area, Right upper leg, Left upper leg, Face, Buttocks, Left lower leg, Right lower leg   Body parts bathed by helper: Left lower leg, Right lower leg, Buttocks     Bathing assist Assist Level: Supervision/Verbal cueing     Upper Body Dressing/Undressing Upper body dressing   What  is the patient wearing?: Pull over shirt    Upper body assist Assist Level: Independent    Lower Body Dressing/Undressing Lower body dressing      What is the patient wearing?: Pants     Lower body assist Assist for lower body dressing: Independent     Toileting Toileting    Toileting assist Assist for toileting: Supervision/Verbal cueing     Transfers Chair/bed transfer  Transfers assist     Chair/bed transfer assist level: Supervision/Verbal cueing     Locomotion Ambulation   Ambulation assist      Assist level: Contact Guard/Touching assist Assistive device: No Device Max distance: 200   Walk  10 feet activity   Assist     Assist level: Contact Guard/Touching assist Assistive device: No Device   Walk 50 feet activity   Assist    Assist level: Contact Guard/Touching assist Assistive device: No Device    Walk 150 feet activity   Assist    Assist level: Contact Guard/Touching assist Assistive device: No Device    Walk 10 feet on uneven surface  activity   Assist     Assist level: Minimal Assistance - Patient > 75%     Wheelchair     Assist Is the patient using a wheelchair?: No             Wheelchair 50 feet with 2 turns activity    Assist            Wheelchair 150 feet activity     Assist          Blood pressure 110/68, pulse 67, temperature 98 F (36.7 C), resp. rate 18, height 6' 2 (1.88 m), weight 120.9 kg, SpO2 98%.  Medical Problem List and Plan: 1. Functional deficits secondary to right frontal glioblastoma s/p crani and resection             -patient may shower             -ELOS/Goals: 10-14 days, supervision goals with PT, OT, SLP- DC date 1/5  - stable to continue CIR  2.  Left peroneal DVT/Antithrombotics: -DVT/anticoagulation: on IV heparin  and to transition to DOAC today. -->  Eliquis  5 mg twice daily; hemoglobin stable on admission--stable 1-2             -antiplatelet therapy: N/A  3. Pain Management: Continue hydrocodone  prn.  4. Mood/Behavior/Sleep:LCSW to follow for evaluation and support.  --continue Ambien  for sleep. --Endorses sleeping well.             -antipsychotic agents: N/A   5. Neuropsych/cognition: This patient is capable of making decisions on his own behalf. 6. Skin/Wound Care: Monitor incision for healing. Routine pressure relief measures.   -Postop day #9, remove staples tomorrow a.m. 1-2--add lidocaine  gel prestaple removal per patient request.  7. Fluids/Electrolytes/Nutrition: Monitor I/O. Check CMET in am -A.m. labs stable.  8. Seizure prophylaxis: On Keppra  500 mg BID.  Decadron  weaned off 12/31 am.   - 1/1: No headaches or neurodeficits off of Decadron ; monitor closely  1-2: Stable.  9. Steroid induced hyperglycemia: Hgb A1C-5.4. has been on novolog  5 units TID-->will d/c and use SSI for elevated BS 10.  HTN: Monitor BP TID--SBP goal < 150.  --Was on Dyazide  PTA-->resume--BP well controlled monitor for now     12/28/2023    5:46 AM 12/27/2023    7:57 PM 12/27/2023    1:04 PM  Vitals with BMI  Systolic 110 115 879  Diastolic  68 67 72  Pulse 67 77 86    11. OSA: Continue CPAP--has been compliant at home also. 12. Constipation Continue Senna. Miralax  was added on 12/31 as last BM 12/28  -1-1: Increase senna to 2 tabs daily; MiraLAX  as needed per patient request  Last bowel movement 12-28--patient has to take as needed MiraLAX  13. Leucocytosis: Likely reactive due to steroids. Monitor for fevers and other signs of infection. Stable 11 on admission.  -Resolved 1-2 14. Polycythemia: Hgb 17.3 - stable 1/1, 1-2 15. H/o Iron deficiency/ S/p gastric sleeve: Resume multivitamin.         LOS: 2 days A FACE TO FACE EVALUATION WAS PERFORMED  Nicholas Stevens 12/28/2023, 3:18 PM

## 2023-12-28 NOTE — Progress Notes (Signed)
 Occupational Therapy Session Note  Patient Details  Name: Nicholas Stevens MRN: [id number] Date of Birth: September 08, 1971  Today's Date: 12/28/2023 OT Individual Time: 8969-8884 OT Individual Time Calculation (min): 45 min    Short Term Goals: Week 1:  OT Short Term Goal 1 (Week 1): STG=LTG due to LOS  Skilled Therapeutic Interventions/Progress Updates:    Pt received in room with his brother in law.  Offered pt a shower but pt declined stating he would prefer for his wife to be here. He was agreeable to demonstrating how he would perform tasks. Ambulated to bathroom able to stand to sit to stand 2x from low toilet without use of bars.  In shower he demonstrated how he would reach all areas to bathe including his feet. Pt uses grab bar with one hand and then other hand washed foot crossed over knee.   Today he has full L shoulder AROM but describes some tightness in L shoulder.  Had pt practice wall slides in flexion and abduction.  Pt stated he was familiar with those exercises from a previous shoulder surgery.  Pt ambulated to main gym to engage in coordination testing with box and blocks and 9 hole.  L is slower but able to pick up blocks and small pegs.  See D/c summary.  Discussed and practiced exercises pt can do at home with equipment he already has at home: -Walking while bouncing and catching basketball with B hands -tossing basketball overhead and catching with B hands. -punching bag with gloves. Pt able to do alternating jabs and crosses but when I added in choreography of single single double he had more difficulty and kept trying to use the R hand for the doubles.    Discussed how to continue to work on his coordination at home helping with home management of laundry, cleaning, etc.   Pt able to ambulate in hallways scanning to the L well.  He tended to gravitate to L side of hallway but self corrected with cues.   The therapy/medical team has cleared pt to go home tomorrow.    Pt in room with all needs met.   Therapy Documentation Precautions:  Precautions Precautions: Fall Precaution Comments: s/p crani; Lhemi (mild) Restrictions Weight Bearing Restrictions Per Provider Order: No   Pain:  No c/o pain     Therapy/Group: Individual Therapy  Philip Kotlyar 12/28/2023, 12:32 PM

## 2023-12-28 NOTE — Progress Notes (Signed)
 Occupational Therapy Discharge Summary  Patient Details  Name: Nicholas Stevens MRN: [id number] Date of Birth: [birth year]  Date of Discharge from OT service:December 28, 2023  Patient has met 10 of 10 long term goals due to improved balance, ability to compensate for deficits, functional use of  LEFT upper and LEFT lower extremity, improved attention, improved awareness, and improved coordination.  Patient to discharge at overall Supervision level.  Patient's care partner is independent to provide the necessary physical assistance at discharge.  Family education completed.   Reasons goals not met: n/a  Recommendation:  Patient will benefit from ongoing skilled OT services in outpatient setting to continue to advance functional skills in the area of iADL and Vocation.  Equipment: No equipment provided  Reasons for discharge: treatment goals met  Patient/family agrees with progress made and goals achieved: Yes  OT Discharge Precautions/Restrictions  Precautions Precautions: Fall Precaution Comments: s/p crani; Lhemi (mild) Restrictions Weight Bearing Restrictions Per Provider Order: No ADL ADL Eating: Independent Where Assessed-Eating: Edge of bed Grooming: Independent Where Assessed-Grooming: Edge of bed Upper Body Bathing: Supervision/safety Where Assessed-Upper Body Bathing: Shower Lower Body Bathing: Supervision/safety Where Assessed-Lower Body Bathing: Shower Upper Body Dressing: Independent Where Assessed-Upper Body Dressing: Edge of bed Lower Body Dressing: Independent Where Assessed-Lower Body Dressing: Edge of bed Toileting: Supervision/safety Where Assessed-Toileting: Teacher, Adult Education: Distant supervision, Close supervision Toilet Transfer Method: Proofreader: Engineer, Technical Sales: Unable to assess Tub/Shower Transfer Method: Unable to assess Film/video Editor: Close supervision Film/video Editor Method:  Designer, Industrial/product: Information systems manager with back Vision Baseline Vision/History: 1 Wears glasses Patient Visual Report: No change from baseline Vision Assessment?: No apparent visual deficits Perception  Perception-Other Comments: mild L inattention Praxis Praxis: WFL Cognition Cognition Overall Cognitive Status: Within Functional Limits for tasks assessed Brief Interview for Mental Status (BIMS) Repetition of Three Words (First Attempt): 3 Temporal Orientation: Year: Correct Temporal Orientation: Month: Accurate within 5 days Temporal Orientation: Day: Correct Recall: Sock: Yes, no cue required Recall: Blue: Yes, no cue required Recall: Bed: Yes, no cue required BIMS Summary Score: 15 Sensation Sensation Light Touch: Appears Intact Hot/Cold: Appears Intact Proprioception: Impaired by gross assessment Stereognosis: Impaired by gross assessment Additional Comments: Inattention left, limited spontaneous movement in LUE, e.g. with walking, gesturing Coordination Gross Motor Movements are Fluid and Coordinated: No Fine Motor Movements are Fluid and Coordinated: No Coordination and Movement Description: using L hand functionally in self care tasks but has delayed movement with gross motor coordination challenges (ie arm swing with walking) 9 Hole Peg Test: L hand 42 seconds, R hand 21 seconds ;  box and block test L hand 39 blocks in 60 seconds, R hand 56 blocks in 60 seconds Motor  Motor Motor - Skilled Clinical Observations: Very mild left hemparesis Motor - Discharge Observations: very mild L hemi Balance Dynamic Sitting Balance Dynamic Sitting - Level of Assistance: 7: Independent Static Standing Balance Static Standing - Level of Assistance: 5: Stand by assistance Dynamic Standing Balance Dynamic Standing - Level of Assistance: 5: Stand by assistance Extremity/Trunk Assessment RUE Assessment RUE Assessment: Within Functional Limits LUE  Assessment Active Range of Motion (AROM) Comments: full AROM to 160 General Strength Comments: 4-/5 Brunstrum level for arm: Stage V Relative Independence from Synergy Brunstrum level for hand: Stage VI Isolated joint movements   Dacey Milberger 12/28/2023, 12:41 PM

## 2023-12-28 NOTE — Progress Notes (Signed)
 Physical Therapy Session Note  Patient Details  Name: FLORENTINO LAABS MRN: [id number] Date of Birth: 1971/01/31  Today's Date: 12/28/2023 PT Individual Time: 0920-1000 PT Individual Time Calculation (min): 40 min   Short Term Goals: Week 1:  PT Short Term Goal 1 (Week 1): STG = LTG  Skilled Therapeutic Interventions/Progress Updates:    Pt presents in room seated in recliner, motivated to participate with PT. Pt denies pain. Session focused on gait training for obstacle negotiation, multidirectional stepping stability, gait mechanics and BLE coordination as well as NMR for BLE coordination and dynamic standing balance.  Pt ambulates with CGA/supervision from room to day room no device ~150'. Pt then completes navigating through 5 cones with cues for L sided attention, manages obstacle negotiation with CGA and avoids cones well. Pt then completes gait training via agility ladder for gait mechanics, BLE coordination, multidirectional stepping stability including: - forward reciprocal step x6 trials (cues to maintain LLE in center of square as pt tends to step on rope of ladder - side stepping x4 trials bilaterally (cues for maintaining neutral hip throughout) - backwards step to leading with RLE then LLE x6 trials (cues for looking backwards over L shoulder for additional challenge to vestibular system, requires CGA to min assist for task) - backwards reciprocal gait x4 (requires min assist with x3 LOB to L) - in and outs x4 trials with cues for decreasing speed of task  Pt then completes NMR for BLE coordinatoin and sequencing including: - cone taps beginning with LLE one sequence, RLE one sequencing, LLE 2 sequence, RLE 2 sequence, progress to alternating BLE with up to two sequences (requires min assist with L LOB, verbal cues for shifting weight onto RLE prior to completing each tap to encourage R lateral weightshift)  Pt ambulates back to room with supervision, provided with cues and  education on how leaning to L can cause L path deviation with pt verbalizing udnerstanding, able to verbalize when he is drifting to L and correct with min cues. Pt remains seated in recliner at end of session with all needs within reach, call light in place.  Therapy Documentation Precautions:  Precautions Precautions: Fall Precaution Comments: s/p crani; Lhemi (mild) Restrictions Weight Bearing Restrictions Per Provider Order: No   Therapy/Group: Individual Therapy  Reche Ohara PT, DPT 12/28/2023, 12:52 PM

## 2023-12-28 NOTE — Progress Notes (Signed)
 Inpatient Rehabilitation  Patient information reviewed and entered into eRehab system by [facility]. Karen Kays., CCC/SLP, PPS Coordinator.  Information including medical coding, functional ability and quality indicators will be reviewed and updated through discharge.

## 2023-12-28 NOTE — Progress Notes (Signed)
 Staples removed per MD. Patient tolerated well.   Tilden Dome, LPN

## 2023-12-28 NOTE — Plan of Care (Signed)
  Problem: RH Balance Goal: LTG Patient will maintain dynamic standing with ADLs (OT) Description: LTG:  Patient will maintain dynamic standing balance with assist during activities of daily living (OT)  Outcome: Completed/Met   Problem: Sit to Stand Goal: LTG:  Patient will perform sit to stand in prep for activites of daily living with assistance level (OT) Description: LTG:  Patient will perform sit to stand in prep for activites of daily living with assistance level (OT) Outcome: Completed/Met   Problem: RH Bathing Goal: LTG Patient will bathe all body parts with assist levels (OT) Description: LTG: Patient will bathe all body parts with assist levels (OT) Outcome: Completed/Met   Problem: RH Dressing Goal: LTG Patient will perform upper body dressing (OT) Description: LTG Patient will perform upper body dressing with assist, with/without cues (OT). Outcome: Completed/Met Goal: LTG Patient will perform lower body dressing w/assist (OT) Description: LTG: Patient will perform lower body dressing with assist, with/without cues in positioning using equipment (OT) Outcome: Completed/Met   Problem: RH Toileting Goal: LTG Patient will perform toileting task (3/3 steps) with assistance level (OT) Description: LTG: Patient will perform toileting task (3/3 steps) with assistance level (OT)  Outcome: Completed/Met   Problem: RH Functional Use of Upper Extremity Goal: LTG Patient will use RT/LT upper extremity as a (OT) Description: LTG: Patient will use right/left upper extremity as a stabilizer/gross assist/diminished/nondominant/dominant level with assist, with/without cues during functional activity (OT) Outcome: Completed/Met   Problem: RH Toilet Transfers Goal: LTG Patient will perform toilet transfers w/assist (OT) Description: LTG: Patient will perform toilet transfers with assist, with/without cues using equipment (OT) Outcome: Completed/Met   Problem: RH Tub/Shower  Transfers Goal: LTG Patient will perform tub/shower transfers w/assist (OT) Description: LTG: Patient will perform tub/shower transfers with assist, with/without cues using equipment (OT) Outcome: Completed/Met   Problem: RH Attention Goal: LTG Patient will demonstrate this level of attention during functional activites (OT) Description: LTG:  Patient will demonstrate this level of attention during functional activites  (OT) Outcome: Completed/Met

## 2023-12-28 NOTE — Progress Notes (Addendum)
 Patient ID: Nicholas Stevens, male   DOB: 1971/06/04, 53 y.o.   MRN: [id number]  SW waiting on updates from medical team on d/c recs and official d/c date due to short ELOS 3-5 days.   0924-SW left message for pt wife informing on above, and SW will follow-up to confirm official discharge date.   1200-SW left message for pt wife informing on pt d/c date being tomorrow, and SW will discuss with pt preferred outpatient location.   *SW met with pt and pt wife in room to inform on above updates. Pt wife has concerns about his discharge, and would like to speak with the attending, and also would like to understand the reason no DME is needed. SW shared medical concerns with medical team. SW will send outpatient PT/OT/SLP referral to [facility] (p:[phone]/f:[phone]).  *Per medical team, pt d/c date is now Sunday (1/5).  Graeme Jude, MSW, LCSW Office: [phone] Cell: [phone] Fax: [phone]

## 2023-12-28 NOTE — Patient Care Conference (Signed)
 Inpatient RehabilitationTeam Conference and Plan of Care Update Date: 12/28/2023   Time: 09:17 AM    Patient Name: Nicholas Stevens      Medical Record Number: [id number]  Date of Birth: [birth year] Sex: Male         Room/Bed: 4W02C/4W02C-01 Payor Info: Payor: [employer] / Plan: [employer] PPO / Product Type: *No Product type* /    Admit Date/Time:  12/26/2023  1:05 PM  Primary Diagnosis:  Glioma of brain [facility])  Hospital Problems: Principal Problem:   Glioma of brain [facility])    Expected Discharge Date: Expected Discharge Date: 12/31/23  Team Members Present: Physician leading conference: Dr. Joesph Likes Social Worker Present: Graeme Jude, LCSWA Nurse Present: Barnie Ronde, RN;Other (comment) Gatha Armor, LPN) PT Present: Kirt Dawn, PT;Christian Manhard, PT OT Present: Monica Peacock, OT SLP Present: Blaise Alderman, SLP     Current Status/Progress Goal Weekly Team Focus  Bowel/Bladder      Continent of bowel and bladder          Swallow/Nutrition/ Hydration   no ST needs           ADL's   pt has met his goals.   supervision shower stall transfers, showering, toileting, toilet transfers; independent dressing   pt is ready for discharge home tomorrow    Mobility   supervision overall for transfers, gait, navigates stairs with CGA   supervision ambulatory  gait training, NMR for L attention, dual tasking, coordination    Communication   no ST needs            Safety/Cognition/ Behavioral Observations  no ST needs            Pain      N/a          Skin      Incision OTA; healing           Discharge Planning:  D/c to home with his wife. Outpatient PT/OT/SLP- [facility]. No DME recommended. PRN support from family.   Team Discussion: Patient doing well post Glioblastoma tumor resection; high functioning.  Patient on target to meet rehab goals: yes  *See Care Plan and progress notes for long and short-term goals.    Revisions to Treatment Plan:  N/a   Teaching Needs: Safety, medications, etc.   Current Barriers to Discharge: N/a  Possible Resolutions to Barriers: Family education No DME needs  OP PT neuro f/u     Medical Summary Current Status: Medically complicated by postoperative wounds, constipation, hypertension, ataxia and left-sided weakness  Barriers to Discharge: Complicated Wound;Self-care education;Medical stability;Pending chemo/radiation   Possible Resolutions to [employer]: Removal of staples today, monitoring vitals for resumption of home blood pressure medications, uptitrating bowel regimen   Continued Need for Acute Rehabilitation Level of Care: The patient requires daily medical management by a physician with specialized training in physical medicine and rehabilitation for the following reasons: Direction of a multidisciplinary physical rehabilitation program to maximize functional independence : Yes Medical management of patient stability for increased activity during participation in an intensive rehabilitation regime.: Yes Analysis of laboratory values and/or radiology reports with any subsequent need for medication adjustment and/or medical intervention. : Yes   I attest that I was present, lead the team conference, and concur with the assessment and plan of the team.   Ronde Barnie B 12/29/2023, 3:51 PM

## 2023-12-28 NOTE — Consult Note (Signed)
 [facility] Neuro-Oncology Consult Note  Patient Care Team: Plotnikov, Karlynn GAILS, MD as PCP - General (Internal Medicine) Himmelrich, Camie RAMAN, RD (Inactive) as Dietitian (Bariatrics)  CHIEF COMPLAINTS/PURPOSE OF CONSULTATION:  Glioblastoma  HISTORY OF PRESENTING ILLNESS:  Nicholas Stevens 53 y.o. male presented with several days of left sided clumsiness, impaired coordination.  Was found to have large right frontal mass, c/w primary brain tumor.  He underwent craniotomy, resection with Dr. Debby on 12/18/23; preliminary path is glioblastoma.  Following surgery, he had persistent right sided weakness, he is currently in rehab.  No seizures, headaches.  MEDICAL HISTORY:  Past Medical History:  Diagnosis Date   DDD (degenerative disc disease), lumbar    HNP (herniated nucleus pulposus)    Hypertension    Liver hemangioma    Morbid obesity (HCC)    OSA (obstructive sleep apnea)    no cpap used since weight loss   Overweight(278.02)    Plantar fasciitis of right foot    Sleep apnea    Tinea barbae     SURGICAL HISTORY: Past Surgical History:  Procedure Laterality Date   BREATH TEK H PYLORI N/A 08/20/2013   Procedure: BREATH TEK H PYLORI;  Surgeon: Donnice KATHEE Lunger, MD;  Location: [facility];  Service: General;  Laterality: N/A;   COLONOSCOPY WITH PROPOFOL  N/A 05/05/2022   Procedure: COLONOSCOPY WITH PROPOFOL ;  Surgeon: Albertus Gordy HERO, MD;  Location: [facility];  Service: Gastroenterology;  Laterality: N/A;   CRANIOTOMY Right 12/18/2023   Procedure: Right Frontal Stereotactic Craniotomy for Resection of Tumor;  Surgeon: Debby Dorn MATSU, MD;  Location[facility];  Service: Neurosurgery;  Laterality: Right;   ESOPHAGOGASTRODUODENOSCOPY (EGD) WITH PROPOFOL  N/A 03/15/2023   Procedure: ESOPHAGOGASTRODUODENOSCOPY (EGD) WITH PROPOFOL ;  Surgeon: Avram Lupita BRAVO, MD;  Location: [facility];  Service: Gastroenterology;  Laterality: N/A;   LAPAROSCOPIC APPENDECTOMY  2007   Dr  Curvin   LAPAROSCOPIC GASTRIC SLEEVE RESECTION N/A 10/08/2013   Procedure: LAPAROSCOPIC SLEEVE GASTRECTOMY;  Surgeon: Donnice KATHEE Lunger, MD;  Location: [facility];  Service: General;  Laterality: N/A;   LUMBAR LAMINECTOMY/DECOMPRESSION MICRODISCECTOMY N/A 09/16/2015   Procedure: MICRO LUMBAR DECOMPRESSION, MICRODISCECTOMY L5 - S1;  Surgeon: Reyes Billing, MD;  Location: [facility];  Service: Orthopedics;  Laterality: N/A;   POLYPECTOMY  05/05/2022   Procedure: POLYPECTOMY;  Surgeon: Albertus Gordy HERO, MD;  Location: [facility];  Service: Gastroenterology;;   ROTATOR CUFF REPAIR Right 2005   Dr Harden    SOCIAL HISTORY: Social History   Socioeconomic History   Marital status: Married    Spouse name: Kyson Kupper   Number of children: 3   Years of education: Not on file   Highest education level: 12th grade  Occupational History   Occupation: Doctor, General Practice PD    Employer: UNEMPLOYED  Tobacco Use   Smoking status: Never    Passive exposure: Never   Smokeless tobacco: Never  Vaping Use   Vaping status: Never Used  Substance and Sexual Activity   Alcohol  use: No   Drug use: No   Sexual activity: Not on file  Other Topics Concern   Not on file  Social History Narrative   Married 18+ years   3 children - ages approx 71, 9 and 5...daughter age 35 w/ DM type 1   Social Drivers of Corporate Investment Banker Strain: Low Risk  (04/02/2023)   Overall Financial Resource Strain (CARDIA)    Difficulty of Paying Living Expenses: Not hard at all  Food Insecurity:  No Food Insecurity (12/18/2023)   Hunger Vital Sign    Worried About Running Out of Food in the Last Year: Never true    Ran Out of Food in the Last Year: Never true  Transportation Needs: No Transportation Needs (12/18/2023)   PRAPARE - Administrator, Civil Service (Medical): No    Lack of Transportation (Non-Medical): No  Physical Activity: Insufficiently Active (04/02/2023)   Exercise Vital Sign    Days of Exercise per Week: 4 days     Minutes of Exercise per Session: 30 min  Stress: No Stress Concern Present (04/02/2023)   [employer] of Occupational Health - Occupational Stress Questionnaire    Feeling of Stress : Not at all  Social Connections: Socially Integrated (04/02/2023)   Social Connection and Isolation Panel [NHANES]    Frequency of Communication with Friends and Family: More than three times a week    Frequency of Social Gatherings with Friends and Family: Once a week    Attends Religious Services: More than 4 times per year    Active Member of [employer] or Organizations: Yes    Attends Engineer, Structural: More than 4 times per year    Marital Status: Married  Catering Manager Violence: Not At Risk (12/18/2023)   Humiliation, Afraid, Rape, and Kick questionnaire    Fear of Current or Ex-Partner: No    Emotionally Abused: No    Physically Abused: No    Sexually Abused: No    FAMILY HISTORY: Family History  Problem Relation Age of Onset   Other Mother        hx of DJD   Liver disease Father        ETOH, Hep C   Hypertension Father    Colon cancer Neg Hx    Colon polyps Neg Hx    Esophageal cancer Neg Hx    Rectal cancer Neg Hx    Stomach cancer Neg Hx     ALLERGIES:  has no known allergies.  MEDICATIONS:  Current Facility-Administered Medications  Medication Dose Route Frequency Provider Last Rate Last Admin   acetaminophen  (TYLENOL ) tablet 325-650 mg  325-650 mg Oral Q4H PRN Love, Pamela S, PA-C       alum & mag hydroxide-simeth (MAALOX/MYLANTA) 200-200-20 MG/5ML suspension 30 mL  30 mL Oral Q4H PRN Love, Pamela S, PA-C       apixaban  (ELIQUIS ) tablet 5 mg  5 mg Oral BID Love, Pamela S, PA-C   5 mg at 12/28/23 0800   bisacodyl  (DULCOLAX) suppository 10 mg  10 mg Rectal Daily PRN Love, Pamela S, PA-C       cyanocobalamin  (VITAMIN B12) tablet 500 mcg  500 mcg Oral Daily Love, Pamela S, PA-C   500 mcg at 12/28/23 0801   diphenhydrAMINE  (BENADRYL ) capsule 25 mg  25 mg Oral Q6H PRN Love,  Pamela S, PA-C       guaiFENesin -dextromethorphan (ROBITUSSIN DM) 100-10 MG/5ML syrup 5-10 mL  5-10 mL Oral Q6H PRN Love, Pamela S, PA-C       hydrALAZINE  (APRESOLINE ) tablet 10 mg  10 mg Oral Q6H PRN Love, Pamela S, PA-C       HYDROcodone -acetaminophen  (NORCO/VICODIN) 5-325 MG per tablet 1 tablet  1 tablet Oral Q4H PRN Love, Pamela S, PA-C       insulin  aspart (novoLOG ) injection 0-9 Units  0-9 Units Subcutaneous TID WC Love, Pamela S, PA-C   1 Units at 12/27/23 1736   latanoprost  (XALATAN ) 0.005 % ophthalmic solution  1 drop  1 drop Both Eyes QHS Maurice Sharlet RAMAN, PA-C   1 drop at 12/27/23 2001   levETIRAcetam  (KEPPRA ) tablet 500 mg  500 mg Oral BID Love, Pamela S, PA-C   500 mg at 12/28/23 0800   lidocaine  (XYLOCAINE ) 2 % jelly 1 Application  1 Application Topical Once Emeline Joesph BROCKS, DO       multivitamin with minerals tablet 1 tablet  1 tablet Oral Daily Maurice Sharlet RAMAN, PA-C   1 tablet at 12/28/23 0800   Oral care mouth rinse  15 mL Mouth Rinse PRN Love, Pamela S, PA-C       pantoprazole  (PROTONIX ) EC tablet 40 mg  40 mg Oral Daily Love, Pamela S, PA-C   40 mg at 12/28/23 0800   polyethylene glycol (MIRALAX  / GLYCOLAX ) packet 17 g  17 g Oral Daily PRN Engler, Morgan C, DO       prochlorperazine  (COMPAZINE ) tablet 5-10 mg  5-10 mg Oral Q6H PRN Love, Pamela S, PA-C       Or   prochlorperazine  (COMPAZINE ) suppository 12.5 mg  12.5 mg Rectal Q6H PRN Love, Pamela S, PA-C       Or   prochlorperazine  (COMPAZINE ) injection 5-10 mg  5-10 mg Intravenous Q6H PRN Love, Pamela S, PA-C       senna-docusate (Senokot-S) tablet 2 tablet  2 tablet Oral Q0600 Maurice Sharlet RAMAN, PA-C   2 tablet at 12/28/23 0800   sodium phosphate  (FLEET) enema 1 enema  1 enema Rectal Once PRN Love, Pamela S, PA-C       zolpidem  (AMBIEN ) tablet 5 mg  5 mg Oral QHS PRN Love, Pamela S, PA-C   5 mg at 12/27/23 1959    REVIEW OF SYSTEMS:   Constitutional: Denies fevers, chills or abnormal weight loss Eyes: Denies blurriness of  vision Ears, nose, mouth, throat, and face: Denies mucositis or sore throat Respiratory: Denies cough, dyspnea or wheezes Cardiovascular: Denies palpitation, chest discomfort or lower extremity swelling Gastrointestinal:  Denies nausea, constipation, diarrhea GU: Denies dysuria or incontinence Skin: Denies abnormal skin rashes Neurological: Per HPI Musculoskeletal: Denies joint pain, back or neck discomfort. No decrease in ROM Behavioral/Psych: Denies anxiety, disturbance in thought content, and mood instability   PHYSICAL EXAMINATION: Vitals:   12/27/23 1957 12/28/23 0546  BP: 115/67 110/68  Pulse: 77 67  Resp: 20 18  Temp: 98 F (36.7 C) 98 F (36.7 C)  SpO2: 97% 98%   KPS: 70. General: Alert, cooperative, pleasant, in no acute distress Head: Normal EENT: No conjunctival injection or scleral icterus. Oral mucosa moist Lungs: Resp effort normal Cardiac: Regular rate and rhythm Abdomen: Soft, non-distended abdomen Skin: No rashes cyanosis or petechiae. Extremities: No clubbing or edema  NEUROLOGIC EXAM: Mental Status: Awake, alert, attentive to examiner. Oriented to self and environment. Language is fluent with intact comprehension.  Cranial Nerves: Visual acuity is grossly normal. Visual fields are full. Extra-ocular movements intact. No ptosis. Face is symmetric, tongue midline. Motor: Tone and bulk are normal. Power is 4/5 in right arm and leg. Reflexes are symmetric, no pathologic reflexes present. Intact finger to nose bilaterally Sensory: Intact to light touch and temperature Gait: Deferred   LABORATORY DATA:  I have reviewed the data as listed Lab Results  Component Value Date   WBC 8.2 12/28/2023   HGB 16.4 12/28/2023   HCT 50.6 12/28/2023   MCV 82.7 12/28/2023   PLT 193 12/28/2023   Recent Labs    03/12/23 1206 03/12/23 1812  03/15/23 0331 12/15/23 0507 12/18/23 0433 12/20/23 2327 12/27/23 0634 12/28/23 0604  NA  --    < > 137 136   < > 139 138 137   K  --    < > 3.8 3.5   < > 4.5 3.8 3.8  CL  --    < > 102 98   < > 107 107 104  CO2  --    < > 29 30   < > 22 24 27   GLUCOSE  --    < > 92 128*   < > 105* 93 98  BUN  --    < > 8 9   < > 18 14 13   CREATININE  --    < > 1.15 1.05   < > 0.86 0.94 1.05  CALCIUM  --    < > 8.7* 9.9   < > 8.6* 8.6* 8.0*  GFRNONAA  --    < > >60 >60   < > >60 >60 >60  PROT 8.3*   < > 7.0 8.3*  --   --  5.5*  --   ALBUMIN 4.8   < > 3.9 4.5  --   --  2.6*  --   AST 25   < > 16 17  --   --  21  --   ALT 21   < > 20 22  --   --  44  --   ALKPHOS 40   < > 33* 49  --   --  38  --   BILITOT 1.3*   < > 0.9 1.0  --   --  0.8  --   BILIDIR 0.4*  --   --   --   --   --   --   --   IBILI 0.9  --   --   --   --   --   --   --    < > = values in this interval not displayed.    RADIOGRAPHIC STUDIES: I have personally reviewed the radiological images as listed and agreed with the findings in the report. CT HEAD WO CONTRAST ( ) Result Date: 12/26/2023 CLINICAL DATA:  Brain/CNS neoplasm, monitor. EXAM: CT HEAD WITHOUT CONTRAST TECHNIQUE: Contiguous axial images were obtained from the base of the skull through the vertex without intravenous contrast. RADIATION DOSE REDUCTION: This exam was performed according to the departmental dose-optimization program which includes automated exposure control, adjustment of the mA and/or kV according to patient size and/or use of iterative reconstruction technique. COMPARISON:  Head CT 12/15/2023.  MRI brain 12/19/2023. FINDINGS: Brain: Postoperative changes of right frontal craniotomy for resection of a mass centered within the right superior frontal gyrus. Expected small amount of blood products in the resection cavity. Similar extent of surrounding vasogenic edema with partial effacement of the right lateral ventricle. No hydrocephalus, extra-axial collection or midline shift. Vascular: No hyperdense vessel or unexpected calcification. Skull: Right frontal craniotomy. Sinuses/Orbits: No  acute finding. Other: None. IMPRESSION: Postoperative changes of right frontal craniotomy for resection of a mass centered within the right superior frontal gyrus. Expected small amount of blood products in the resection cavity. Similar extent of surrounding vasogenic edema with partial effacement of the right lateral ventricle. Electronically Signed   By: Ryan Chess M.D.   On: 12/26/2023 09:13   VAS US  LOWER EXTREMITY VENOUS (DVT) Result Date: 12/24/2023  Lower Venous DVT Study Patient Name:  ANGELGABRIEL WILLMORE  Date  of Exam:   12/23/2023 Medical Rec #: [id number]            Accession #:    [id number] Date of Birth: 1971-02-02           Patient Gender: M Patient Age:   32 years Exam Location:  [facility] Procedure:      VAS US  LOWER EXTREMITY VENOUS (DVT) Referring Phys: [facility] BERGMAN --------------------------------------------------------------------------------  Indications: Pain, and Inability to walk due to pain.  Comparison Study: No prior exam. Performing Technologist: Edilia Elden Appl  Examination Guidelines: A complete evaluation includes B-mode imaging, spectral Doppler, color Doppler, and power Doppler as needed of all accessible portions of each vessel. Bilateral testing is considered an integral part of a complete examination. Limited examinations for reoccurring indications may be performed as noted. The reflux portion of the exam is performed with the patient in reverse Trendelenburg.  +-----+---------------+---------+-----------+----------+--------------+ RIGHTCompressibilityPhasicitySpontaneityPropertiesThrombus Aging +-----+---------------+---------+-----------+----------+--------------+ CFV  Full           Yes      Yes                                 +-----+---------------+---------+-----------+----------+--------------+ SFJ  Full           Yes      Yes                                  +-----+---------------+---------+-----------+----------+--------------+   +---------+---------------+---------+-----------+----------+--------------+ LEFT     CompressibilityPhasicitySpontaneityPropertiesThrombus Aging +---------+---------------+---------+-----------+----------+--------------+ CFV      Full           Yes      Yes                                 +---------+---------------+---------+-----------+----------+--------------+ SFJ      Full           Yes      Yes                                 +---------+---------------+---------+-----------+----------+--------------+ FV Prox  Full                                                        +---------+---------------+---------+-----------+----------+--------------+ FV Mid   Full                                                        +---------+---------------+---------+-----------+----------+--------------+ FV DistalFull                                                        +---------+---------------+---------+-----------+----------+--------------+ PFV      Full                                                        +---------+---------------+---------+-----------+----------+--------------+  POP      Full           Yes      Yes                                 +---------+---------------+---------+-----------+----------+--------------+ PTV      Full                                                        +---------+---------------+---------+-----------+----------+--------------+ PERO     None           No       No                                  +---------+---------------+---------+-----------+----------+--------------+     Summary: RIGHT: - No evidence of common femoral vein obstruction.   LEFT: - Findings consistent with acute deep vein thrombosis involving the left peroneal veins.  - No cystic structure found in the popliteal fossa.  *See table(s) above for measurements and observations.  Electronically signed by Debby Robertson on 12/24/2023 at 12:31:39 PM.    Final    MR BRAIN W WO CONTRAST Result Date: 12/19/2023 CLINICAL DATA:  Follow-up craniotomy. EXAM: MRI HEAD WITHOUT AND WITH CONTRAST TECHNIQUE: Multiplanar, multiecho pulse sequences of the brain and surrounding structures were obtained without and with intravenous contrast. CONTRAST:  10 mL GADAVIST  GADOBUTROL  1 MMOL/ML IV SOLN COMPARISON:  Preoperative imaging 12/16/2023 FINDINGS: Brain: Status post right frontal craniotomy for debulking of a previously seen 5.6 cm hemorrhagic and necrotic mass at the right posterior frontal vertex. Good debulking with collapse of the lesion, the region enhancement around the margins now measuring 3.2 cm cephalo caudal, 2.8 cm front to back and 2.0 cm right to left. Small amount of air or blood products in the central portion. Persistent regional edema as would be expected. Less mass effect. Right-to-left shift now measuring 5 mm. Mass-effect upon the frontal horn of the right lateral ventricle. Other nearby error is of tumor infiltration including the frontal operculum, basal ganglia and cingulate gyrus persist without contrast enhancement in those regions. No hydrocephalus. No extra-axial collection. Vascular: Major vessels at the base of the brain show flow. Skull and upper cervical spine: Negative Sinuses/Orbits: Clear/normal Other: None IMPRESSION: 1. Good debulking of a previously seen 5.6 cm hemorrhagic and necrotic mass at the right posterior frontal vertex. Small amount of air or blood products in the central portion of the lesion. Collapse of the lesion with a region of enhancement now measuring 3.2 x 2.8 x 2.0 cm. Persistent regional edema as would be expected. Less mass effect. Right-to-left shift now measuring 5 mm. Mass-effect upon the frontal horn of the right lateral ventricle. 2. Other nearby areas of tumor infiltration including the frontal operculum, basal ganglia and cingulate gyrus  persist without contrast enhancement in those regions. Electronically Signed   By: Oneil Officer M.D.   On: 12/19/2023 08:58   MR BRAIN W WO CONTRAST Result Date: 12/16/2023 CLINICAL DATA:  Brain neoplasm staging EXAM: MRI HEAD WITHOUT AND WITH CONTRAST TECHNIQUE: Multiplanar, multiecho pulse sequences of the brain and surrounding structures were obtained without and with intravenous contrast. CONTRAST:  10mL GADAVIST  GADOBUTROL  1  MMOL/ML IV SOLN COMPARISON:  12/15/2023 brain MRI FINDINGS: An abbreviated protocol was performed, consisting of axial, sagittal and coronal T1-weighted images and axial FLAIR sequence. Brain: Unchanged size of large mass centered in the right frontal lobe, measuring 4.7 x 3.8 by 4.8 cm. There is a large amount of surrounding vasogenic edema. 8 mm leftward midline shift. This study was performed for stereotactic localization. IMPRESSION: Unchanged size of large mass centered in the right frontal lobe, measuring 4.7 x 3.8 by 4.8 cm. Large amount of surrounding vasogenic edema. 8 mm leftward midline shift. Electronically Signed   By: Franky Stanford M.D.   On: 12/16/2023 20:44   CT CHEST ABDOMEN PELVIS W CONTRAST Result Date: 12/16/2023 CLINICAL DATA:  CNS neoplasm.  Staging study.  * Tracking Code: BO * EXAM: CT CHEST, ABDOMEN, AND PELVIS WITH CONTRAST TECHNIQUE: Multidetector CT imaging of the chest, abdomen and pelvis was performed following the standard protocol during bolus administration of intravenous contrast. RADIATION DOSE REDUCTION: This exam was performed according to the departmental dose-optimization program which includes automated exposure control, adjustment of the mA and/or kV according to patient size and/or use of iterative reconstruction technique. CONTRAST:  75mL OMNIPAQUE  IOHEXOL  350 MG/ML SOLN COMPARISON:  Chest CTA 03/11/2023. FINDINGS: CT CHEST FINDINGS Cardiovascular: The heart size is normal. No substantial pericardial effusion. No thoracic aortic aneurysm.  No substantial atherosclerosis of the thoracic aorta. Mediastinum/Nodes: No mediastinal lymphadenopathy. There is no hilar lymphadenopathy. The esophagus has normal imaging features. There is no axillary lymphadenopathy. Lungs/Pleura: Subsegmental atelectasis noted posterior right lung base. No suspicious pulmonary nodule or mass. No focal airspace consolidation. No pleural effusion. Musculoskeletal: No worrisome lytic or sclerotic osseous abnormality. CT ABDOMEN PELVIS FINDINGS Hepatobiliary: Jen hemangioma identified right hepatic lobe with exophytic hemangioma posterior left hepatic lobe. These were characterized by MRI back in 2014 and are similar in the interval. Gallbladder is decompressed. No intrahepatic or extrahepatic biliary dilation. Pancreas: No focal mass lesion. No dilatation of the main duct. No intraparenchymal cyst. No peripancreatic edema. Spleen: No splenomegaly. No suspicious focal mass lesion. Adrenals/Urinary Tract: No adrenal nodule or mass. Kidneys unremarkable. No evidence for hydroureter. Bladder is decompressed by Foley catheter. Stomach/Bowel: Surgical changes are noted in the stomach, compatible with sleeve gastrectomy. Duodenum is normally positioned as is the ligament of Treitz. No small bowel wall thickening. No small bowel dilatation. The terminal ileum is normal. Nonvisualization of the appendix is consistent with the reported history of appendectomy. No gross colonic mass. No colonic wall thickening. Vascular/Lymphatic: There is mild atherosclerotic calcification of the abdominal aorta without aneurysm. There is no gastrohepatic or hepatoduodenal ligament lymphadenopathy. No retroperitoneal or mesenteric lymphadenopathy. No pelvic sidewall lymphadenopathy. Reproductive: Prostate gland upper normal for size. Other: No intraperitoneal free fluid. Musculoskeletal: No worrisome lytic or sclerotic osseous abnormality. IMPRESSION: 1. No evidence for metastatic disease in the chest,  abdomen, or pelvis. 2. Surgical changes of sleeve gastrectomy. 3. Giant right cavernous hemangioma with exophytic left hepatic lobe hemangioma. These were characterized by and are similar to abdominal MRI 11/10/2013. 4.  Aortic Atherosclerosis (ICD10-I70.0). Electronically Signed   By: Camellia Candle M.D.   On: 12/16/2023 19:20   CT HEAD WO CONTRAST ( ) Result Date: 12/15/2023 CLINICAL DATA:  Mental status change, persistent or worsening EXAM: CT HEAD WITHOUT CONTRAST TECHNIQUE: Contiguous axial images were obtained from the base of the skull through the vertex without intravenous contrast. RADIATION DOSE REDUCTION: This exam was performed according to the departmental dose-optimization program which includes automated exposure control, adjustment of  the mA and/or kV according to patient size and/or use of iterative reconstruction technique. COMPARISON:  CT head and MRI head from earlier today. FINDINGS: Brain: In comparison to same day CT head, new/increased 1.7 cm focus of hyperdense hemorrhage along the inferior aspect of the hemorrhagic mass (series 3, image 24; series 5, image 47 in the right frontal lobe with similar mass effect and surrounding edema. No hydrocephalus. No evidence of acute large vascular territory infarct. Vascular: No hyperdense vessel. Skull: No acute fracture. Sinuses/Orbits: Mild paranasal sinus mucosal thickening. No acute orbital findings. IMPRESSION: In comparison to same day CT head, new/increased 1.7 cm focus of hyperdense hemorrhage along the inferior aspect of the hemorrhagic mass in the right frontal lobe with similar mass effect and surrounding edema. The mass is further characterized on same day MRI. Electronically Signed   By: Gilmore GORMAN Molt M.D.   On: 12/15/2023 15:41   MR Brain W and Wo Contrast Result Date: 12/15/2023 CLINICAL DATA:  Headache. EXAM: MRI HEAD WITHOUT AND WITH CONTRAST TECHNIQUE: Multiplanar, multiecho pulse sequences of the brain and surrounding  structures were obtained without and with intravenous contrast. CONTRAST:  10mL GADAVIST  GADOBUTROL  1 MMOL/ML IV SOLN COMPARISON:  Head CT from earlier today FINDINGS: Brain: Known mass at the superior right frontal lobe with heterogeneous enhancement and a dominant rim enhancing cavity with irregular margination and internal blood products, the enhancing area in total measures up to 4.5 cm craniocaudal. There is a rim of T2 hyperintensity and expansion with contiguous infiltrative cortical and white matter T2 signal in the adjacent right frontal lobe that spans both ACA and MCA distributions. This nonenhancing infiltrative appearance is most suggestive of underlying glioma rather than solitary metastasis. There is local mass effect with very early right uncal herniation on coronal T2 weighted imaging. No mass seen in this area on 2016 head CT. Vascular: Major flow voids and vascular enhancements are preserved Skull and upper cervical spine: Normal marrow signal Sinuses/Orbits: Negative IMPRESSION: Necrotic and hemorrhagic mass in the superior right frontal lobe with regional infiltrative signal abnormality, features of glioblastoma. Mass effect causes very early right uncal herniation on coronal images. Electronically Signed   By: Dorn Roulette M.D.   On: 12/15/2023 08:45   DG Chest Portable 1 View Result Date: 12/15/2023 CLINICAL DATA:  Evaluate for weakness. EXAM: PORTABLE CHEST 1 VIEW COMPARISON:  03/12/2023 FINDINGS: There is asymmetric elevation of the right hemidiaphragm. Normal cardiomediastinal contours. No pleural fluid, interstitial edema or airspace consolidation. Visualized osseous structures are unremarkable. IMPRESSION: 1. No acute cardiopulmonary disease. 2. Asymmetric elevation of the right hemidiaphragm. Electronically Signed   By: Waddell Calk M.D.   On: 12/15/2023 06:13   CT ANGIO HEAD NECK W WO CM Result Date: 12/15/2023 CLINICAL DATA:  Neuro deficit with stroke suspected. EXAM: CT  ANGIOGRAPHY HEAD AND NECK WITH AND WITHOUT CONTRAST TECHNIQUE: Multidetector CT imaging of the head and neck was performed using the standard protocol during bolus administration of intravenous contrast. Multiplanar CT image reconstructions and MIPs were obtained to evaluate the vascular anatomy. Carotid stenosis measurements (when applicable) are obtained utilizing NASCET criteria, using the distal internal carotid diameter as the denominator. RADIATION DOSE REDUCTION: This exam was performed according to the departmental dose-optimization program which includes automated exposure control, adjustment of the mA and/or kV according to patient size and/or use of iterative reconstruction technique. CONTRAST:  75mL OMNIPAQUE  IOHEXOL  350 MG/ML SOLN COMPARISON:  Head CT from earlier today. FINDINGS: CTA NECK FINDINGS Aortic arch: Unremarkable Right carotid  system: Accounting for motion artifact vessels are smoothly contoured and widely patent with mild atheromatous plaque at the bifurcation. Left carotid system: Accounting for motion artifact vessels are smoothly contoured and diffusely patent with minimal atheromatous plaque at the bifurcation. Vertebral arteries: No proximal subclavian or vertebral stenosis. No evidence of beading or dissection. Skeleton: Posterior longitudinal ligament ossification to a mild degree at mid and lower cervical levels, which could contact the ventral cord. Other neck: No acute or aggressive finding Upper chest: Clear apical lungs. Review of the MIP images confirms the above findings CTA HEAD FINDINGS Anterior circulation: Hemorrhagic lesion at the superior right frontal lobe shows no spot sign or underlying aneurysm/vascular malformation. No major branch occlusion, beading, or flow reducing stenosis. Posterior circulation: The vertebral and basilar arteries are smoothly contoured and diffusely patent. No branch occlusion, beading, or aneurysm. Venous sinuses: Diffusely patent, including at  the superior sagittal sinus. Anatomic variants: None significant Review of the MIP images confirms the above findings IMPRESSION: 1. No vascular lesion or spot sign seen at the hemorrhagic right superior frontal lesion. 2. Mild atherosclerosis. 3. Intermittent motion artifact especially affecting the neck. Electronically Signed   By: Dorn Roulette M.D.   On: 12/15/2023 06:08   CT HEAD WO CONTRAST Result Date: 12/15/2023 CLINICAL DATA:  Neuro deficit with acute stroke suspected EXAM: CT HEAD WITHOUT CONTRAST TECHNIQUE: Contiguous axial images were obtained from the base of the skull through the vertex without intravenous contrast. RADIATION DOSE REDUCTION: This exam was performed according to the departmental dose-optimization program which includes automated exposure control, adjustment of the mA and/or kV according to patient size and/or use of iterative reconstruction technique. COMPARISON:  08/04/2015 FINDINGS: Brain: Low-density area in the superior right frontal lobe with iso and hyperdense center. The hemorrhagic area measures up to 2.7 x 1.7 cm. Prominent degree of adjacent low-density with isodense and partially cystic center which may favors underlying mass. No hyperdensity in the adjacent superior sagittal sinus. There is local mass effect and leftward midline shift without herniation. No hydrocephalus or extra-axial collection. Vascular: No hyperdense vessel or unexpected calcification. Skull: Normal. Negative for fracture or focal lesion. Sinuses/Orbits: No acute finding Case discussed in epic chat, hemorrhage is already known to provider. IMPRESSION: Partially hemorrhagic lesion in the superior right frontal lobe with local mass effect. Suspect underlying mass, recommend brain MRI with contrast. Electronically Signed   By: Dorn Roulette M.D.   On: 12/15/2023 06:00    ASSESSMENT & PLAN:  Glioblastoma  We had an extensive conversation with Terrye LELON Louder regarding pathology, prognosis,  and available treatment pathways for glioblastoma.  We are still pending IDH status.  We are encouraged by good functional status, favorable tumor location.  Once through rehab course and 4 weeks removed from surgery, we recommended proceeding with course of intensity modulated radiation therapy and concurrent daily Temozolomide .  Radiation will be administered Mon-Fri over 6 weeks, Temodar  will be dosed at 75mg /m2 to be given daily over 42 days.  We reviewed side effects of temodar , including fatigue, nausea/vomiting, constipation, and cytopenias.  Informed consent was verbally obtained at bedside to proceed with oral chemotherapy.  Chemotherapy should be held for the following:  ANC less than 1,000  Platelets less than 100,000  LFT or creatinine greater than 2x ULN  If clinical concerns/contraindications develop  Every 2 weeks during radiation, labs will be checked accompanied by a clinical evaluation in the brain tumor clinic.  We also discussed and patient consented for additional tumor profiling  and sequencing through [facility].  Advanced tumor profiling could help identify actionable mutation for targeted therapy and lead to direct clinical benefit.     All questions were answered. The patient knows to call the clinic with any problems, questions or concerns.  The total time spent in the encounter was 80 minutes and more than 50% was on counseling and review of test results     Arthea MARLA Manns, MD 12/28/2023 3:31 PM

## 2023-12-28 NOTE — Progress Notes (Signed)
 Physical Therapy Session Note  Patient Details  Name: Nicholas Stevens MRN: [id number] Date of Birth: December 27, 1970  Today's Date: 12/28/2023 PT Individual Time: 9196-9154 PT Individual Time Calculation (min): 42 min   Today's Date: 12/28/2023 PT Individual Time: 8694-8584 PT Individual Time Calculation (min): 70 min   Short Term Goals: Week 1:  PT Short Term Goal 1 (Week 1): STG = LTG  Skilled Therapeutic Interventions/Progress Updates:     1st Session: Pt received seated in recliner and agrees to therapy. No complaint of pain. Pt stands and ambulates to gym with CGA/minA and no AD, with cues for upright posture and neutral weight distribution. Seated rest break. Pt performs standing balance activity, tasked with tapping toes on colored cones depending on PT command. Pt requires minA for stability, with tendency to lose balance to the lt. Pt performs one, two, and three step commands. Pt has difficulty with coordination on Lt, and also loses balance to the lt when attempting to balance on Lt foot and performing foot taps with Rt. Activity progressed by having 4lb ankle weight on LLE. Mirror provided for visual feedback. PT positioned on pt's Rt side and provides cues to maintain shoulder contact on Rt to promote improved balance and increased lateral weight shift to the Rt to prevent LOBs to the left. Pt takes multiple seated rest breaks during activity. Pt then ambulates back to room with CGA and same cues. Left seated in recliner with all needs within reach.   2nd Session: Pt received seated in recliner and agrees to therapy. Wife present as well as MD, discussing concerns for discharge and pt's safety with mobility. Pt does not complain of pain. PT provides update on pt's mobility status as well recommendations for safe mobility following discharge. Wife and pt verbalize understanding. Pt stands with cues for initiation, then ambulates x300' to ortho gym with close supervision, with cues for  upright gaze to improve posture and balance, and attending to Lt visual field to improve safety awareness. Pt performs ramp navigation and car transfer with cues for sequencing. Pt then ambulates back to dayroom with similar assistance and cues. Pt's wife concerned with pt's safety and PT provides education on expectations for recovery as well as strategies for continued neuro recovery following discharge. Pt ambulates to stair well and completes x20 6 steps with Rt hand rail and CGA, with cues for safe step sequencing. Seated rest break. Pt attempts ambulating through two rows of cones, performing toe taps to the Rt and lt on cones to provide balance challenge, as well as challenging coordination and motor control. PT provides minA and pt has tendency to have decreased control of LLE, stepping on several cones with full weight due to lack of adequate lateral weight shift to the contralateral side. PT provides demonstration and explanation of weight shifting requirements to accurately perform activity. Pt then completes Nsutep for endurance training and reciprocal coordination training. Pt completes x10:00 at workload of 7 with average steps per minute ~60. PT provides cues for hand and foot placement and completing full available ROM. Pt ambulates back to room with same assistance and cues. Left seated in recliner with alarm intact and all needs within reach.    Therapy Documentation Precautions:  Precautions Precautions: Fall Precaution Comments: s/p crani; Lhemi (mild) Restrictions Weight Bearing Restrictions Per Provider Order: No   Therapy/Group: Individual Therapy  Elsie JAYSON Dawn PT, DPT 12/28/2023, 2:54 PM

## 2023-12-28 NOTE — Progress Notes (Signed)
 Inpatient Rehabilitation Care Coordinator Assessment and Plan Patient Details  Name: Nicholas Stevens MRN: [id number] Date of Birth: 1971-10-15  Today's Date: 12/28/2023  Hospital Problems: Principal Problem:   Glioma of brain [facility])  Past Medical History:  Past Medical History:  Diagnosis Date   DDD (degenerative disc disease), lumbar    HNP (herniated nucleus pulposus)    Hypertension    Liver hemangioma    Morbid obesity (HCC)    OSA (obstructive sleep apnea)    no cpap used since weight loss   Overweight(278.02)    Plantar fasciitis of right foot    Sleep apnea    Tinea barbae    Past Surgical History:  Past Surgical History:  Procedure Laterality Date   BREATH TEK H PYLORI N/A 08/20/2013   Procedure: BREATH TEK VEAR [name];  Surgeon: Donnice KATHEE Lunger, MD;  Location: [facility];  Service: General;  Laterality: N/A;   COLONOSCOPY WITH PROPOFOL  N/A 05/05/2022   Procedure: COLONOSCOPY WITH PROPOFOL ;  Surgeon: Albertus Gordy HERO, MD;  Location: [facility];  Service: Gastroenterology;  Laterality: N/A;   CRANIOTOMY Right 12/18/2023   Procedure: Right Frontal Stereotactic Craniotomy for Resection of Tumor;  Surgeon: Debby Dorn MATSU, MD;  Location: [facility] OR;  Service: Neurosurgery;  Laterality: Right;   ESOPHAGOGASTRODUODENOSCOPY (EGD) WITH PROPOFOL  N/A 03/15/2023   Procedure: ESOPHAGOGASTRODUODENOSCOPY (EGD) WITH PROPOFOL ;  Surgeon: Avram Lupita BRAVO, MD;  Location: [facility];  Service: Gastroenterology;  Laterality: N/A;   LAPAROSCOPIC APPENDECTOMY  2007   Dr Curvin   LAPAROSCOPIC GASTRIC SLEEVE RESECTION N/A 10/08/2013   Procedure: LAPAROSCOPIC SLEEVE GASTRECTOMY;  Surgeon: Donnice KATHEE Lunger, MD;  Location: [facility];  Service: General;  Laterality: N/A;   LUMBAR LAMINECTOMY/DECOMPRESSION MICRODISCECTOMY N/A 09/16/2015   Procedure: MICRO LUMBAR DECOMPRESSION, MICRODISCECTOMY L5 - S1;  Surgeon: Reyes Billing, MD;  Location: [facility];  Service: Orthopedics;  Laterality: N/A;   POLYPECTOMY   05/05/2022   Procedure: POLYPECTOMY;  Surgeon: Albertus Gordy HERO, MD;  Location: [facility];  Service: Gastroenterology;;   ROTATOR CUFF REPAIR Right 2005   Dr Harden   Social History:  reports that he has never smoked. He has never been exposed to tobacco smoke. He has never used smokeless tobacco. He reports that he does not drink alcohol  and does not use drugs.  Family / Support Systems Marital Status: Married How Long?: 33 years Patient Roles: Spouse Spouse/Significant Other: Adonna (wife) Children: 3 children- Demetris Mickey, [employer], and Fonda (lives at home but in school) Other Supports: none reported Anticipated Caregiver: Wife Ability/Limitations of Caregiver: Wife will be primary caregiver. 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 Currently works with [employer] for the last two years. Education: some Charity Fundraiser - How often do you need to have someone help you when you read instructions, pamphlets, or other written material from your doctor or pharmacy?: Never Writes: Yes Employment Status: Employed Name of Employer: [employer] service Return to Work Plans: TBD Marine Scientist Issues: Denies Guardian/Conservator: Wife is his HCPOA   Abuse/Neglect Abuse/Neglect Assessment Can Be Completed: Yes Physical Abuse: Denies Verbal Abuse: Denies Sexual Abuse: Denies Exploitation of patient/patient's resources: Denies Self-Neglect: Denies  Patient response to: Social Isolation - How often do you feel lonely or isolated from those around you?: Never  Emotional Status Pt's affect,  behavior and adjustment status: Pt in good spirits at time of visit Recent Psychosocial Issues: Denies Psychiatric History: Denies Substance Abuse History:  Denies  Patient / Family Perceptions, Expectations & Goals Pt/Family understanding of illness & functional limitations: Pt and wife have a general understanding of care needs Premorbid pt/family roles/activities: Independent Anticipated changes in roles/activities/participation: Supervision with ADLs/IADLs Pt/family expectations/goals: pt goal is to work on Health And Safety Inspector: None Premorbid Home Care/DME Agencies: None Transportation available at discharge: Wife Is the patient able to respond to transportation needs?: Yes In the past 12 months, has lack of transportation kept you from medical appointments or from getting medications?: No In the past 12 months, has lack of transportation kept you from meetings, work, or from getting things needed for daily living?: No Resource referrals recommended: Neuropsychology  Discharge Planning Living Arrangements: Spouse/significant other Support Systems: Spouse/significant other Type of Residence: Private residence Insurance Resources: Media Planner (specify) ([employer]) Financial Screen Referred: Yes Living Expenses: Banker Management: Patient, Spouse Does the patient have any problems obtaining your medications?: No Home Management: Both managed home care needs Patient/Family Preliminary Plans: Wife Care Coordinator Barriers to Discharge: Decreased caregiver support, Lack of/limited family support, Insurance for SNF coverage Care Coordinator Anticipated Follow Up Needs: HH/OP Expected length of stay: ELOS 3-5 days; d/c 1/3  Clinical Impression SW met with pt and pt wife in room to introduce self, explain role, and discuss discharge process. Pt is not a cytogeneticist. No DME.   Brookelle Pellicane A Braydon Kullman 12/28/2023, 12:51 PM

## 2023-12-29 ENCOUNTER — Other Ambulatory Visit (HOSPITAL_COMMUNITY): Payer: Self-pay

## 2023-12-29 DIAGNOSIS — L270 Generalized skin eruption due to drugs and medicaments taken internally: Secondary | ICD-10-CM | POA: Insufficient documentation

## 2023-12-29 DIAGNOSIS — I824Z9 Acute embolism and thrombosis of unspecified deep veins of unspecified distal lower extremity: Secondary | ICD-10-CM | POA: Insufficient documentation

## 2023-12-29 LAB — GLUCOSE, CAPILLARY
Glucose-Capillary: 93 mg/dL (ref 70–99)
Glucose-Capillary: 117 mg/dL — ABNORMAL HIGH (ref 70–99)
Glucose-Capillary: 120 mg/dL — ABNORMAL HIGH (ref 70–99)
Glucose-Capillary: 144 mg/dL — ABNORMAL HIGH (ref 70–99)

## 2023-12-29 MED ORDER — APIXABAN 5 MG PO TABS
5.0000 mg | ORAL_TABLET | Freq: Two times a day (BID) | ORAL | 0 refills | Status: DC
  Filled 2023-12-29: qty 60, 30d supply, fill #0

## 2023-12-29 MED ORDER — PANTOPRAZOLE SODIUM 40 MG PO TBEC
40.0000 mg | DELAYED_RELEASE_TABLET | Freq: Every day | ORAL | 0 refills | Status: DC
  Filled 2023-12-29: qty 30, 30d supply, fill #0

## 2023-12-29 MED ORDER — PIMECROLIMUS 1 % EX CREA: 1.0000 | TOPICAL_CREAM | Freq: Two times a day (BID) | CUTANEOUS | Status: AC | PRN

## 2023-12-29 MED ORDER — FEXOFENADINE HCL 60 MG PO TABS
60.0000 mg | ORAL_TABLET | Freq: Two times a day (BID) | ORAL | 0 refills | Status: DC
  Filled 2023-12-29: qty 60, 30d supply, fill #0

## 2023-12-29 MED ORDER — SENNOSIDES-DOCUSATE SODIUM 8.6-50 MG PO TABS
2.0000 | ORAL_TABLET | Freq: Every day | ORAL | 0 refills | Status: AC
  Filled 2023-12-29: qty 60, 30d supply, fill #0

## 2023-12-29 MED ORDER — CAMPHOR-MENTHOL 0.5-0.5 % EX LOTN
TOPICAL_LOTION | CUTANEOUS | 0 refills | Status: AC | PRN
  Filled 2023-12-29: qty 222, fill #0

## 2023-12-29 MED ORDER — LEVETIRACETAM 500 MG PO TABS
500.0000 mg | ORAL_TABLET | Freq: Two times a day (BID) | ORAL | 0 refills | Status: DC
  Filled 2023-12-29 – 2024-01-01 (×2): qty 60, 30d supply, fill #0

## 2023-12-29 NOTE — Progress Notes (Addendum)
 PROGRESS NOTE   Subjective/Complaints:  No acute complaints. No events overnight.  Vitals stable. Did very well with AM therapies.  Per patient, over last 2 days rash has spread from behind ears to across forehead, forearms, and trunk. Itches, but not painful. Has never had this before.   ROS:  +rash Denies fevers, chills, N/V, abdominal pain, constipation, diarrhea, SOB, cough, chest pain, new weakness or paraesthesias.    Objective:   No results found.  Recent Labs    12/27/23 0634 12/28/23 0604  WBC 11.3* 8.2  HGB 17.0 16.4  HCT 51.4 50.6  PLT 194 193   Recent Labs    12/27/23 0634 12/28/23 0604  NA 138 137  K 3.8 3.8  CL 107 104  CO2 24 27  GLUCOSE 93 98  BUN 14 13  CREATININE 0.94 1.05  CALCIUM 8.6* 8.0*    Intake/Output Summary (Last 24 hours) at 12/29/2023 1002 Last data filed at 12/29/2023 0807 Gross per 24 hour  Intake 240 ml  Output --  Net 240 ml        Physical Exam: Vital Signs Blood pressure 112/73, pulse 66, temperature 98 F (36.7 C), resp. rate 18, height 6' 2 (1.88 m), weight 120.9 kg, SpO2 96%.  Physical Exam Constitutional: No apparent distress. Appropriate appearance for age.  Sitting in chair. HENT: Mild left eyebrow droop.  + Left hemicrani Eyes: PERRLA. EOMI. Visual fields grossly intact.  Cardiovascular: RRR, no murmurs/rub/gallops. No Edema. Peripheral pulses 2+  Respiratory: CTAB. No rales, rhonchi, or wheezing. On RA.  Abdomen: + bowel sounds, normoactive. No distention or tenderness.  Skin:  Left scalp surgical incision without staples, well-approximated, no drainage or dehiscence Fine, maculopapular rash across face, forearms, chest, abdomen and back. No warmth or drainage.  MSK:      No apparent deformity.  Moving all 4 extremities with full active range of motion.       Neurologic exam:  Cognition: AAO to person, place, time and event.  Language: Fluent, No  substitutions or neoglisms. No dysarthria.  Memory:  No apparent deficits  Insight: Good  insight into current condition.  Mood: Pleasant affect, appropriate mood.  Sensation: To light touch slightly reduced in distal right lower extremity; otherwise intact Reflexes: 2+ in BL UE and LEs. Negative Hoffman's and babinski signs bilaterally.  CN: 2-12 grossly intact.  Coordination: Extremely mild left upper extremity ataxia--ongoing Spasticity: MAS 0 in all extremities.  Strength: 5-/5 LUE, otherwise out of 5 throughout           Assessment/Plan: 1. Functional deficits which require 3+ hours per day of interdisciplinary therapy in a comprehensive inpatient rehab setting. Physiatrist is providing close team supervision and 24 hour management of active medical problems listed below. Physiatrist and rehab team continue to assess barriers to discharge/monitor patient progress toward functional and medical goals  Care Tool:  Bathing    Body parts bathed by patient: Right arm, Left arm, Chest, Abdomen, Front perineal area, Right upper leg, Left upper leg, Face, Buttocks, Left lower leg, Right lower leg   Body parts bathed by helper: Left lower leg, Right lower leg, Buttocks     Bathing assist Assist  Level: Supervision/Verbal cueing     Upper Body Dressing/Undressing Upper body dressing   What is the patient wearing?: Pull over shirt    Upper body assist Assist Level: Independent    Lower Body Dressing/Undressing Lower body dressing      What is the patient wearing?: Pants     Lower body assist Assist for lower body dressing: Independent     Toileting Toileting    Toileting assist Assist for toileting: Supervision/Verbal cueing     Transfers Chair/bed transfer  Transfers assist     Chair/bed transfer assist level: Supervision/Verbal cueing     Locomotion Ambulation   Ambulation assist      Assist level: Contact Guard/Touching assist Assistive device: No  Device Max distance: 200   Walk 10 feet activity   Assist     Assist level: Contact Guard/Touching assist Assistive device: No Device   Walk 50 feet activity   Assist    Assist level: Contact Guard/Touching assist Assistive device: No Device    Walk 150 feet activity   Assist    Assist level: Contact Guard/Touching assist Assistive device: No Device    Walk 10 feet on uneven surface  activity   Assist     Assist level: Minimal Assistance - Patient > 75%     Wheelchair     Assist Is the patient using a wheelchair?: No             Wheelchair 50 feet with 2 turns activity    Assist            Wheelchair 150 feet activity     Assist          Blood pressure 112/73, pulse 66, temperature 98 F (36.7 C), resp. rate 18, height 6' 2 (1.88 m), weight 120.9 kg, SpO2 96%.  Medical Problem List and Plan: 1. Functional deficits secondary to right frontal glioblastoma s/p crani and resection             -patient may shower             -ELOS/Goals: 10-14 days, supervision goals with PT, OT, SLP- DC date 1/5  - stable to continue CIR  2.  Left peroneal DVT/Antithrombotics: -DVT/anticoagulation: on IV heparin  and to transition to DOAC today. -->  Eliquis  5 mg twice daily; hemoglobin stable on admission--stable 1-2             -antiplatelet therapy: N/A  3. Pain Management: Continue hydrocodone  prn.    - 1/3: DC hydrocodone , has PRN tylenol   4. Mood/Behavior/Sleep:LCSW to follow for evaluation and support.  --continue Ambien  for sleep. --Endorses sleeping well.             -antipsychotic agents: N/A   5. Neuropsych/cognition: This patient is capable of making decisions on his own behalf. 6. Skin/Wound Care: Monitor incision for healing. Routine pressure relief measures.   -Postop day #9, remove staples tomorrow a.m. 1-2--add lidocaine  gel prestaple removal per patient request.   - 1/3: Allergic rash - benadryl  25 mg Q6H PRN, sarna lotion  to back,  standing allegra  60 mg BID -- no recent medication additions or obvious causes -- monitor  7. Fluids/Electrolytes/Nutrition: Monitor I/O. Check CMET in am -A.m. labs stable.  8. Seizure prophylaxis: On Keppra  500 mg BID. Decadron  weaned off 12/31 am.   - 1/1: No headaches or neurodeficits off of Decadron ; monitor closely  1-2: Stable.  9. Steroid induced hyperglycemia: Hgb A1C-5.4. has been on novolog  5 units TID-->will d/c  and use SSI for elevated BS 10.  HTN: Monitor BP TID--SBP goal < 150.  --Was on Dyazide  PTA-->resume--BP well controlled monitor for now     12/29/2023    6:05 AM 12/28/2023    8:27 PM 12/28/2023    3:40 PM  Vitals with BMI  Systolic 112 135 881  Diastolic 73 75 77  Pulse 66 73 79    11. OSA: Continue CPAP--has been compliant at home also. 12. Constipation Continue Senna. Miralax  was added on 12/31 as last BM 12/28  -1-1: Increase senna to 2 tabs daily; MiraLAX  as needed per patient request  Last bowel movement 1/2 per patient  13. Leucocytosis: Likely reactive due to steroids. Monitor for fevers and other signs of infection. Stable 11 on admission.  -Resolved 1-2 14. Polycythemia: Hgb 17.3 - stable 1/1, 1-2 15. H/o Iron deficiency/ S/p gastric sleeve: Resume multivitamin.         LOS: 3 days A FACE TO FACE EVALUATION WAS PERFORMED  Nicholas Stevens Likes 12/29/2023, 10:02 AM

## 2023-12-29 NOTE — Discharge Summary (Signed)
 Physician Discharge Summary  Patient ID: Nicholas Stevens MRN: [id number] DOB/AGE: 01-03-1971 53 y.o.  Admit date: 12/26/2023 Discharge date: 12/31/2023  Discharge Diagnoses:  Principal Problem:   Glioma of brain [facility]) Active Problems:   OSA on CPAP   Insomnia   Allergic drug rash   DVT, lower extremity, distal (HCC)   Discharged Condition: stable  Significant Diagnostic Studies: CT HEAD WO CONTRAST ( ) Result Date: 12/26/2023 CLINICAL DATA:  Brain/CNS neoplasm, monitor. EXAM: CT HEAD WITHOUT CONTRAST TECHNIQUE: Contiguous axial images were obtained from the base of the skull through the vertex without intravenous contrast. RADIATION DOSE REDUCTION: This exam was performed according to the departmental dose-optimization program which includes automated exposure control, adjustment of the mA and/or kV according to patient size and/or use of iterative reconstruction technique. COMPARISON:  Head CT 12/15/2023.  MRI brain 12/19/2023. FINDINGS: Brain: Postoperative changes of right frontal craniotomy for resection of a mass centered within the right superior frontal gyrus. Expected small amount of blood products in the resection cavity. Similar extent of surrounding vasogenic edema with partial effacement of the right lateral ventricle. No hydrocephalus, extra-axial collection or midline shift. Vascular: No hyperdense vessel or unexpected calcification. Skull: Right frontal craniotomy. Sinuses/Orbits: No acute finding. Other: None. IMPRESSION: Postoperative changes of right frontal craniotomy for resection of a mass centered within the right superior frontal gyrus. Expected small amount of blood products in the resection cavity. Similar extent of surrounding vasogenic edema with partial effacement of the right lateral ventricle. Electronically Signed   By: Ryan Chess M.D.   On: 12/26/2023 09:13    Labs:  Basic Metabolic Panel:    Latest Ref Rng & Units 12/28/2023    6:04 AM 12/27/2023     6:34 AM 12/20/2023   11:27 PM  BMP  Glucose 70 - 99 mg/dL 98  93  894   BUN 6 - 20 mg/dL 13  14  18    Creatinine 0.61 - 1.24 mg/dL 8.94  9.05  9.13   Sodium 135 - 145 mmol/L 137  138  139   Potassium 3.5 - 5.1 mmol/L 3.8  3.8  4.5   Chloride 98 - 111 mmol/L 104  107  107   CO2 22 - 32 mmol/L 27  24  22    Calcium 8.9 - 10.3 mg/dL 8.0  8.6  8.6      CBC: Recent Labs  Lab 12/26/23 0641 12/27/23 0634 12/28/23 0604  WBC 11.4* 11.3* 8.2  NEUTROABS  --  7.7  --   HGB 17.5* 17.0 16.4  HCT 53.3* 51.4 50.6  MCV 82.1 82.2 82.7  PLT 214 194 193    CBG: Recent Labs  Lab 12/30/23 0601 12/30/23 1214 12/30/23 1649 12/30/23 2156 12/31/23 0546  GLUCAP 104* 77 103* 105* 102*    Brief HPI:   Nicholas Stevens is a 53 y.o. male with history of OSA, HTN, obesity, liver hemangioma who was admitted on 12/15/2023 with reports of decreased coordination and minimal use of LUE/LLE and fall.  MRI brain done revealing necrotic hemorrhagic mass in superior right frontal lobe with features of glioblastoma and mass effect with very early right uncal herniation.  Dr. Debby evaluated patient and recommended IV Decadron  to decrease edema prior to elective surgery.  CT chest, abdomen pelvis was negative for metastatic disease.  Follow-up MRI spine showed unchanged mass with large vasogenic edema and 8 mm leftward shift.  Steroid-induced hyperglycemia was managed with use of NovoLog  and sliding scale insulin .  He underwent right frontal stereotactic craniotomy for resection of frontal lobe tumor 12/23 by Dr. Debby.  Postop is showing improvement in left-sided weakness and follow-up MRI brain which showed good debulking of tumor with decrease in enhancement.  Follow-up MRI brain showed debulking of tumor with decrease in mass effect and collapse of lesion with region of enhancement and 5 mm right-to-left shift.  Hospital course significant for development of acute DVT in left peroneal vein which was  treated with IV heparin  and follow-up CT was stable.  He was transition to DOAC at discharge. He continued to be limited to    Hospital Course: Nicholas Stevens was admitted to rehab 12/26/2023 for inpatient therapies to consist of PT and OT at least three hours five days a week. Past admission physiatrist, therapy team and rehab RN have worked together to provide customized collaborative inpatient rehab. He was maintained on eliquis  with follow up CBC showing H/H to be stable. Reactive leucocytosis has resolved. He has been seizure free during his stay and neurologically stable on DOAC. His blood pressures were monitored on TID basis and has been stable. He was weaned off decadron  by discharge from acute therefore novolog  was discontinued. His blood sugars were monitored achs during his stay and  were normalizing. His mood has been stable and wife has been present to provide encouragement.   Vitamin B 12 and multivitamin were resumed due to history of gastric sleeve. Insomnia has been managed with home dose of Ambien . He developed rash two days prior to discharge and allegra  was added in addition to benadryl  which has helped resolve rash. Crani incision is healing well and staples were removed without difficulty. Bowel program has been augmented to help manage constipation. Dr.Vaslow has followed up for input and recommends XRT with chemo as well as consent for additional tumor profiling and sequencing thru [facility] to help ID targeted tx. Patient has made good gains during his stay and supervision is recommended at discharge. He will continue to receive follow up outpatient PT and OT at [facility] after discharge.    Rehab course: During patient's stay in rehab weekly team conference was held to monitor patient's progress, set goals and discuss barriers to discharge. At admission, patient required CGA with mobility and min assist with ADL tasks. He scored WFL on cognition testing and no ST needed  during his stay. He  has had improvement in activity tolerance, balance, postural control as well as ability to compensate for deficits. He has had improvement in functional use LUE  and LLE as well as improvement in awareness. He is able to complete ADL tasks at modified independent level. He is independent for transfers and is able to ambulate 300' without AD. Family education has been completed.   Discharge disposition: 01-Home or Self Care  Diet: Diabetic diet.  Special Instructions: No driving or strenuous activity till cleared by MD.    Allergies as of 12/31/2023   No Known Allergies      Medication List     STOP taking these medications    dexamethasone  2 MG tablet Commonly known as: DECADRON    Vitamin D3 50 MCG (2000 UT) capsule       TAKE these medications    B-12 PO Take 1 tablet by mouth daily.   camphor-menthol  lotion Commonly known as: SARNA Apply topically as needed for itching.   Eliquis  5 MG Tabs tablet Generic drug: apixaban  Take 1 tablet (5 mg total) by mouth 2 (two) times  daily.   fexofenadine  60 MG tablet Commonly known as: ALLEGRA  Take 1 tablet (60 mg total) by mouth 2 (two) times daily.   Fish Oil 1000 MG Caps Take 1,000 mg by mouth daily.   latanoprost  0.005 % ophthalmic solution Commonly known as: XALATAN  Place 1 drop into both eyes at bedtime.   levETIRAcetam  500 MG tablet Commonly known as: KEPPRA  Take 1 tablet (500 mg total) by mouth 2 (two) times daily.   multivitamin with minerals Tabs tablet Take 1 tablet by mouth daily.   pantoprazole  40 MG tablet Commonly known as: PROTONIX  Take 1 tablet (40 mg total) by mouth daily.   pimecrolimus  1 % cream Commonly known as: Elidel  Apply 1 Application topically 2 (two) times daily as needed (Dermatitis).   Senna-S 8.6-50 MG tablet Generic drug: senna-docusate Take 2 tablets by mouth daily at 6 (six) AM.   zolpidem  10 MG tablet Commonly known as: AMBIEN  TAKE 1 TABLET BY MOUTH  EVERYDAY AT BEDTIME        Follow-up Information     Plotnikov, Karlynn GAILS, MD. Call.   Specialty: Internal Medicine Why: for post hospital follow up Contact information: [address] [place] [address] [phone]         Emeline Search C, DO Follow up.   Specialty: Physical Medicine and Rehabilitation Why: office will call you with follow up appointment Contact information: [address] [place] [address] [phone]         Buckley Arthea POUR, MD. Call.   Specialties: Psychiatry, Neurology, Oncology Why: for follow up appointment Contact information: [address] [place] [address] [phone]         Arlinda Ranks, MD Follow up.   Specialty: Pulmonary Disease Why: As needed Contact information: [address] [place] [address] [place] [address] [phone]         Debby Dorn MATSU, MD. Call.   Specialty: Neurosurgery Why: for post op check Contact information: [address] [place] [address] [phone]                 Signed: Sharlet GORMAN Schmitz 01/01/2024, 6:04 PM

## 2023-12-29 NOTE — Progress Notes (Signed)
 Checked on patient and asked if there was anything they needed for home CPAP.  They were good for tonight.  Asked for them to let us know if they needed anything.  Patient self manages his machine.

## 2023-12-29 NOTE — Plan of Care (Signed)
  Problem: RH Bathing Goal: LTG Patient will bathe all body parts with assist levels (OT) Description: LTG: Patient will bathe all body parts with assist levels (OT) Flowsheets (Taken 12/29/2023 1108) LTG: Pt will perform bathing with assistance level/cueing: Independent with assistive device  LTG: Position pt will perform bathing: Shower   Problem: RH Toileting Goal: LTG Patient will perform toileting task (3/3 steps) with assistance level (OT) Description: LTG: Patient will perform toileting task (3/3 steps) with assistance level (OT)  Flowsheets (Taken 12/29/2023 1108) LTG: Pt will perform toileting task (3/3 steps) with assistance level: Independent   Problem: RH Toilet Transfers Goal: LTG Patient will perform toilet transfers w/assist (OT) Description: LTG: Patient will perform toilet transfers with assist, with/without cues using equipment (OT) Flowsheets (Taken 12/29/2023 1108) LTG: Pt will perform toilet transfers with assistance level of: Independent

## 2023-12-29 NOTE — IPOC Note (Signed)
 Overall Plan of Care [facility]) Patient Details Name: Nicholas Stevens MRN: [id number] DOB: Jan 21, 1971  Admitting Diagnosis: Glioma of brain [facility])  Hospital Problems: Principal Problem:   Glioma of brain (HCC) Active Problems:   OSA on CPAP   Insomnia   Allergic drug rash   DVT, lower extremity, distal (HCC)     Functional Problem List: Nursing Bowel, Safety, Endurance, Medication Management, Pain  PT Balance, Endurance, Motor, Perception, Safety  OT Balance, Motor, Vision, Perception, Endurance, Safety  SLP    TR         Basic ADL's: OT Bathing, Dressing, Toileting     Advanced  ADL's: OT       Transfers: PT Bed Mobility, Car, Bed to Chair  OT Toilet, Tub/Shower     Locomotion: PT Ambulation, Stairs     Additional Impairments: OT Fuctional Use of Upper Extremity  SLP        TR      Anticipated Outcomes Item Anticipated Outcome  Self Feeding Independent  Swallowing      Basic self-care  Supervision  Toileting  Supervision   Bathroom Transfers Supervision  Bowel/Bladder  manage bowel w mod I assist  Transfers  supervision  Locomotion  supervision  Communication     Cognition     Pain  manage < 4 with prns  Safety/Judgment  manage w cues   Therapy Plan: PT Intensity: Minimum of 1-2 x/day ,45 to 90 minutes PT Frequency: 5 out of 7 days PT Duration Estimated Length of Stay: 3-5 days OT Intensity: Minimum of 1-2 x/day, 45 to 90 minutes OT Frequency: 5 out of 7 days OT Duration/Estimated Length of Stay: 3-5 days     Team Interventions: Nursing Interventions Patient/Family Education, Disease Management/Prevention, Discharge Planning, Pain Management, Bowel Management, Medication Management  PT interventions Ambulation/gait training, Cognitive remediation/compensation, Discharge planning, DME/adaptive equipment instruction, Functional mobility training, Pain management, Psychosocial support, Splinting/orthotics, Therapeutic Activities, UE/LE  Strength taining/ROM, Visual/perceptual remediation/compensation, Warden/ranger, Community reintegration, Disease management/prevention, Development worker, international aid stimulation, Neuromuscular re-education, Patient/family education, Skin care/wound management, Stair training, Therapeutic Exercise, UE/LE Coordination activities, Wheelchair propulsion/positioning  OT Interventions Warden/ranger, Disease mangement/prevention, Neuromuscular re-education, Patient/family education, Self Care/advanced ADL retraining, Therapeutic Exercise, UE/LE Coordination activities, Visual/perceptual remediation/compensation, UE/LE Strength taining/ROM, Therapeutic Activities, Functional mobility training, DME/adaptive equipment instruction, Discharge planning, Cognitive remediation/compensation  SLP Interventions    TR Interventions    SW/CM Interventions Discharge Planning, Psychosocial Support, Patient/Family Education   Barriers to Discharge MD  Medical stability, Wound care, and Pending chemo/radiation  Nursing Decreased caregiver support 2 level/level entry main B+B w spouse  PT Insurance for SNF coverage    OT      SLP      SW Decreased caregiver support, Lack of/limited family support, Community Education Officer for SNF coverage     Team Discharge Planning: Destination: PT-Home ,OT- Home , SLP-Home Projected Follow-up: PT-Outpatient PT, 24 hour supervision/assistance, OT-  Outpatient OT, SLP-None Projected Equipment Needs: PT-To be determined, OT- To be determined, SLP-None recommended by SLP Equipment Details: PT- , OT-  Patient/family involved in discharge planning: PT- Patient, Family member/caregiver,  OT-Patient, Family member/caregiver, SLP-Patient, Family member/caregiver  MD ELOS: 5-7 days Medical Rehab Prognosis:  Excellent Assessment: The patient has been admitted for CIR therapies with the diagnosis of glioma of the brain. The team will be addressing functional mobility, strength,  stamina, balance, safety, adaptive techniques and equipment, self-care, bowel and bladder mgt, patient and caregiver education,. Goals have been set at supervision. Anticipated discharge destination is home.  See Team Conference Notes for weekly updates to the plan of care

## 2023-12-29 NOTE — Progress Notes (Signed)
 Occupational Therapy Session Note  Patient Details  Name: Nicholas Stevens MRN: [id number] Date of Birth: December 18, 1971  Session 1 Today's Date: 12/29/2023 OT Individual Time: 1000-1115 OT Individual Time Calculation (min): 75 min    Session 2 Today's Date: 12/29/2023 OT Individual Time: 1436-1530 OT Individual Time Calculation (min): 54 min    Short Term Goals: Week 1:  OT Short Term Goal 1 (Week 1): STG=LTG due to LOS  Skilled Therapeutic Interventions/Progress Updates:    Session 1 Pt received sitting up in the recliner with no c/o pain. Pt completed 200 ft of functional mobility to the therapy gym with close (S). Pt completed BITS functional reaching activity with focus on L visual scanning and L functional reaching. 108 repetitions with min cueing for full visual scanning and 94% accuracy. He completed full sit <> stand with 6 lb dumbbells in BUE, completing alternating forward punch to challenge generalized strengthening, LUE coordination/strengthening, and increasing functional activity tolerance. 3x10 repetitions with rest breaks between each set. He then completed dynamic UE coordination/strengthening via a boxing activity with alternating R/L hook and forward punch with focus on accuracy and force generation. He then completed 3x10 glute bridges from supine to challenge posterior chain strengthening for improved standing balance and fall risk reduction. Transitioned into prone for 3x5-8 push ups to challenge BUE strength and LUE stabilization through the scapula. Ended with dynamic stretching into childs pose and cat/dog yoga poses. He returned to his room and was left sitting up with all needs met .   Session 2 Pt received sitting up in the recliner with no c/o pain, agreeable to OT session. Wife present. 150 ft of functional mobility to the therapy gym with (S). He completed 3x10 alternating supermans to address posterior chain strengthening and then quadroped single arm/leg hold to  challenge core stabilization and strengthening. Min A required for core stability and cueing for proper muscle activation. He then completed floor transfer with education provided on fall recovery. He was able to return the demo with min cueing, x2 trials. Pt completed blocked practice sit <> stand, 3x10 repetitions holding two 6# dumbbells to challenge generalized strengthening for ADL transfers, as well as increasing functional activity tolerance and cardiorespiratory endurance. Pt required close (S). He returned to his room following, 150 ft of functional mobility with (S). Pt was left sitting up in the recliner with all needs met and call bell within reach.    Therapy Documentation Precautions:  Precautions Precautions: Fall Precaution Comments: s/p crani; Lhemi (mild) Restrictions Weight Bearing Restrictions Per Provider Order: No   Therapy/Group: Individual Therapy  Nena VEAR Moats 12/29/2023, 6:47 AM

## 2023-12-29 NOTE — Progress Notes (Signed)
 Physical Therapy Session Note  Patient Details  Name: Nicholas Stevens MRN: [id number] Date of Birth: [birth year]  Today's Date: 12/29/2023 PT Individual Time: 0805-0915 PT Individual Time Calculation (min): 70 min   Short Term Goals: Week 1:  PT Short Term Goal 1 (Week 1): STG = LTG  Skilled Therapeutic Interventions/Progress Updates:    Pt presents in room seated in recliner with wife and friend present. Pt wife present throughout session. Session focused on gait training and NMR for BLE coordination and BLE muscle fiber recruitment, dynamic standing balance with emphasis for wt shift to RLE, dual tasking and multidirectional stepping stability.  Pt ambulates to day room with close supervision demonstrating decreased L path deviation. Pt comes to sitting on kinetron, then completes interval training in standing on kinetron 1 min work/1 min rest for 5 rounds total. First 3 rounds pt completes with BUE support at 20 CM/sec workload with cues for R lateral lean, last 2 rounds pt completes without BUE support, mirror in front with cues for shifting weight bilaterally at 50cm/sec.  Pt then completes gait training with agility ladder including: - forward reciprocal step x4 trials (cues to maintain LLE in center of square as pt tends to step on rope of ladder) - side stepping with stepping into/out of each square 4 trials bilaterally - backwards step to gait alternating BLE x4 trials (cues for looking backwards over bilateraly shoulder for additional challenge to vestibular system, requires CGA to min assist for task) - backwards reciprocal gait x4 (requires min assist with x3 LOB to L) - forward walking with tapping cones with each step into square x8 trials  Pt completes NMR for BUE/BLE coordination and dual tasking including: - self ball toss bimanual hand toss seated, standing, alternating marches, LLE march only (cues for coordination altnernating toss and march) x50 each - self ball toss  between hands seated, standing, marches x30 each (cues for increasing height of ball toss) - walking self ball toss bimanual toss 175' - walking self ball toss between hands 175'  Pt ambulates back to room with supervision cues for L obstacle negotiation. Pt returns to sitting in recliner with pt wife present at end of session.   Therapy Documentation Precautions:  Precautions Precautions: Fall Precaution Comments: s/p crani; Lhemi (mild) Restrictions Weight Bearing Restrictions Per Provider Order: No    Therapy/Group: Individual Therapy  Reche Ohara PT, DPT 12/29/2023, 9:54 AM

## 2023-12-30 DIAGNOSIS — I10 Essential (primary) hypertension: Secondary | ICD-10-CM

## 2023-12-30 LAB — GLUCOSE, CAPILLARY
Glucose-Capillary: 104 mg/dL — ABNORMAL HIGH (ref 70–99)
Glucose-Capillary: 77 mg/dL (ref 70–99)
Glucose-Capillary: 103 mg/dL — ABNORMAL HIGH (ref 70–99)
Glucose-Capillary: 105 mg/dL — ABNORMAL HIGH (ref 70–99)

## 2023-12-30 NOTE — Progress Notes (Signed)
 Physical Therapy Discharge Summary  Patient Details  Name: Nicholas Stevens MRN: [id number] Date of Birth: [birth year]  Date of Discharge from PT service:December 30, 2023  Today's Date: 12/30/2023 PT Individual Time: 9195-9085 PT Individual Time Calculation (min): 70 min   Today's Date: 12/30/2023 PT Individual Time: 1104-1200 PT Individual Time Calculation (min): 56 min   Patient has met 8 of 8 long term goals due to improved activity tolerance, improved balance, improved postural control, and improved awareness.  Patient to discharge at an ambulatory level Supervision.   Patient's care partner is independent to provide the necessary physical assistance at discharge.  Reasons goals not met: NA  Recommendation:  Patient will benefit from ongoing skilled PT services in outpatient setting to continue to advance safe functional mobility, address ongoing impairments in balance, lt sided attention, and minimize fall risk.  Equipment: No equipment provided  Reasons for discharge: treatment goals met and discharge from hospital  Patient/family agrees with progress made and goals achieved: Yes  Skilled Therapeutic Interventions: 1st Session:  Pt received seated in recliner and agrees to therapy. No complaint of pain. Pt performs all mobility without AD, with infrequent cues to attend to Lt side, as pt has improved Lt hemibody and Lt sided awareness significantly. Pt ambulates to gym, then completes several balance assessments. Pt completes Berg, TUG, FGA, and 5x Sit to Stand, as detailed below. Pt then ambulates to ortho gym and completes ramp navigation and car transfer with cues for sequencing. Pt then ambulates to stair well and completes x20 6 steps with Rt hand rail and cues for safety. Pt attempts single leg stance activity on Bosu ball but has difficulty maintaining balance, requiring minA/modA for stability. Activity adjusted and pt performs dual leg balance on Bosu ball with minA, and  cues for engagement of BLEs, as well as use of gaze for postural stabilization. Pt ambulates to main gym and completes Weight Distribution analysis on Biodex. Performed to bring attention to Lt sided deficits and tendency to favor Rt side, with pt keeping ~60% of weight in RLE prior to cueing. Pt is able to correct and maintain symmetrical lateral weight bearing without upper extremity support for 3-4 minutes. Pt ambulates back to room. Left seated with all needs within reach.   TUG - 9.2, 10.3, 9.8 5x Sit to Stand - 6.5, 7.3, 6.6  2nd Session: Pt received seated in recliner and agrees to therapy. No complaint of pain. Pt ambulates to gym with cues to attend to Lt side. Pt performs balance and single leg stance activity in parallel bars. Pt performs high knee marching on Bosu ball with BUE support, x20 with each leg. PT provides cues for foot placement and correct performance. Activity progressed by having pt place Lt hand in pocket and only utilizing RUE for support. Following rest break, pt completes same activity with LUE support and R hand in pocket. PT provides CGA and occasional cues for correct sequencing. Pt then performs cone tapping with alternating legs with stance leg on bosu ball to provide increased challenge and unreliable somatosensory input. Activity progressed by having pt complete with single upper extremity support, starting with RUE. Pt then completes with only LUE support.   Pt completes Nustep for reciprocal coordination and endurance training. Pt completes x10:00 on workload of 8 with average step per minute ~60. PT provides cues for hand and foot placement and completing full available ROM.   Pt ambulates back to room. Left seated with all needs within reach  PT Discharge Precautions/Restrictions Precautions Precautions: Fall Restrictions Weight Bearing Restrictions Per Provider Order: No Pain Interference Pain Interference Pain Effect on Sleep: 0. Does not apply - I have  not had any pain or hurting in the past 5 days Pain Interference with Therapy Activities: 0. Does not apply - I have not received rehabilitationtherapy in the past 5 days Pain Interference with Day-to-Day Activities: 1. Rarely or not at all Vision/Perception  Vision - History Ability to See in Adequate Light: 0 Adequate Perception Perception: Impaired Preception Impairment Details: Inattention/Neglect Perception-Other Comments: Lt sided inattention, much improved from eval Praxis Praxis: WFL  Cognition Overall Cognitive Status: Within Functional Limits for tasks assessed Arousal/Alertness: Awake/alert Orientation Level: Oriented X4 Safety/Judgment: Appears intact Sensation Sensation Light Touch: Appears Intact Coordination Gross Motor Movements are Fluid and Coordinated: Yes Fine Motor Movements are Fluid and Coordinated: No Motor  Motor Motor: Hemiplegia Motor - Discharge Observations: very mild L hemi, much improved from eval  Mobility Bed Mobility Bed Mobility: Sit to Supine;Supine to Sit Rolling Right: Independent Right Sidelying to Sit: Independent Supine to Sit: Independent Sit to Supine: Independent Transfers Transfers: Sit to Stand;Stand to Sit Sit to Stand: Independent Stand to Sit: Independent Transfer (Assistive device): None Locomotion  Gait Ambulation: Yes Gait Assistance: Supervision/Verbal cueing Gait Distance (Feet): 300 Feet Assistive device: None Gait Gait: Yes Gait Pattern: Within Functional Limits Stairs / Additional Locomotion Stairs: Yes Stairs Assistance: Supervision/Verbal cueing Stair Management Technique: One rail Right Number of Stairs: 20 Height of Stairs: 6 Ramp: Supervision/Verbal cueing Curb: Supervision/Verbal cueing Wheelchair Mobility Wheelchair Mobility: No  Trunk/Postural Assessment  Cervical Assessment Cervical Assessment: Within Functional Limits Thoracic Assessment Thoracic Assessment: Within Functional  Limits Lumbar Assessment Lumbar Assessment: Within Functional Limits Postural Control Postural Control: Deficits on evaluation Righting Reactions: delayed  Balance Balance Balance Assessed: Yes Standardized Balance Assessment Standardized Balance Assessment: Berg Balance Test;Functional Gait Assessment;Timed Up and Go Test Berg Balance Test Sit to Stand: Able to stand without using hands and stabilize independently Standing Unsupported: Able to stand safely 2 minutes Sitting with Back Unsupported but Feet Supported on Floor or Stool: Able to sit safely and securely 2 minutes Stand to Sit: Sits safely with minimal use of hands Transfers: Able to transfer safely, minor use of hands Standing Unsupported with Eyes Closed: Able to stand 10 seconds safely Standing Ubsupported with Feet Together: Able to place feet together independently and stand 1 minute safely From Standing, Reach Forward with Outstretched Arm: Can reach confidently >25 cm (10) From Standing Position, Pick up Object from Floor: Able to pick up shoe safely and easily From Standing Position, Turn to Look Behind Over each Shoulder: Looks behind from both sides and weight shifts well Turn 360 Degrees: Able to turn 360 degrees safely in 4 seconds or less Standing Unsupported, Alternately Place Feet on Step/Stool: Able to stand independently and safely and complete 8 steps in 20 seconds Standing Unsupported, One Foot in Front: Able to plae foot ahead of the other independently and hold 30 seconds Standing on One Leg: Able to lift leg independently and hold equal to or more than 3 seconds Total Score: 53 Timed Up and Go Test TUG: Normal TUG Normal TUG (seconds): 9.8 Static Sitting Balance Static Sitting - Balance Support: No upper extremity supported;Feet supported Static Sitting - Level of Assistance: 7: Independent Dynamic Sitting Balance Dynamic Sitting - Balance Support: No upper extremity supported;Feet supported;During  functional activity Dynamic Sitting - Level of Assistance: 7: Independent Static Standing Balance Static Standing -  Balance Support: No upper extremity supported;During functional activity Static Standing - Level of Assistance: 6: Modified independent (Device/Increase time) Dynamic Standing Balance Dynamic Standing - Balance Support: No upper extremity supported;During functional activity Dynamic Standing - Level of Assistance: 6: Modified independent (Device/Increase time) Functional Gait  Assessment Gait assessed : Yes Gait Level Surface: Walks 20 ft in less than 5.5 sec, no assistive devices, good speed, no evidence for imbalance, normal gait pattern, deviates no more than 6 in outside of the 12 in walkway width. Change in Gait Speed: Able to smoothly change walking speed without loss of balance or gait deviation. Deviate no more than 6 in outside of the 12 in walkway width. Gait with Horizontal Head Turns: Performs head turns smoothly with slight change in gait velocity (eg, minor disruption to smooth gait path), deviates 6-10 in outside 12 in walkway width, or uses an assistive device. Gait with Vertical Head Turns: Performs task with slight change in gait velocity (eg, minor disruption to smooth gait path), deviates 6 - 10 in outside 12 in walkway width or uses assistive device Gait and Pivot Turn: Pivot turns safely within 3 sec and stops quickly with no loss of balance. Step Over Obstacle: Is able to step over one shoe box (4.5 in total height) without changing gait speed. No evidence of imbalance. Gait with Narrow Base of Support: Ambulates 4-7 steps. Gait with Eyes Closed: Walks 20 ft, no assistive devices, good speed, no evidence of imbalance, normal gait pattern, deviates no more than 6 in outside 12 in walkway width. Ambulates 20 ft in less than 7 sec. Ambulating Backwards: Walks 20 ft, no assistive devices, good speed, no evidence for imbalance, normal gait Steps: Alternating feet,  must use rail. Total Score: 24 Extremity Assessment  RLE Assessment RLE Assessment: Within Functional Limits LLE Assessment General Strength Comments: Grossly 4+/5   Elsie JAYSON Dawn, PT, DPT 12/30/2023, 4:26 PM

## 2023-12-30 NOTE — Progress Notes (Signed)
 PROGRESS NOTE   Subjective/Complaints:  Pt doing great, slept well, denies pain, LBM yesterday, urinating fine. Up walking with PT today. So ready to go home tomorrow! Denies any other complaints or concerns today.   ROS:  +rash Denies fevers, chills, N/V, abdominal pain, constipation, diarrhea, SOB, cough, chest pain, new weakness or paraesthesias.    Objective:   No results found.  Recent Labs    12/28/23 0604  WBC 8.2  HGB 16.4  HCT 50.6  PLT 193   Recent Labs    12/28/23 0604  NA 137  K 3.8  CL 104  CO2 27  GLUCOSE 98  BUN 13  CREATININE 1.05  CALCIUM 8.0*    Intake/Output Summary (Last 24 hours) at 12/30/2023 1122 Last data filed at 12/30/2023 0700 Gross per 24 hour  Intake 833 ml  Output --  Net 833 ml        Physical Exam: Vital Signs Blood pressure 122/74, pulse 74, temperature 98 F (36.7 C), resp. rate 16, height 6' 2 (1.88 m), weight 120.9 kg, SpO2 96%.  Physical Exam Constitutional: No apparent distress. Appropriate appearance for age.  Walking with no assistance. HENT: Mild left eyebrow droop.  + Left hemicrani Eyes: PERRLA. EOMI. Visual fields grossly intact.  Cardiovascular: RRR, no murmurs/rub/gallops. No Edema. Peripheral pulses 2+  Respiratory: CTAB. No rales, rhonchi, or wheezing. On RA.  Abdomen: + bowel sounds, normoactive. No distention or tenderness.  Skin: no rash seen today Left scalp surgical incision without staples, well-approximated, no drainage or dehiscence  PRIOR EXAMS: Skin:Fine, maculopapular rash across face, forearms, chest, abdomen and back. No warmth or drainage. MSK:      No apparent deformity.  Moving all 4 extremities with full active range of motion.       Neurologic exam:  Cognition: AAO to person, place, time and event.  Language: Fluent, No substitutions or neoglisms. No dysarthria.  Memory:  No apparent deficits  Insight: Good  insight into current  condition.  Mood: Pleasant affect, appropriate mood.  Sensation: To light touch slightly reduced in distal right lower extremity; otherwise intact Reflexes: 2+ in BL UE and LEs. Negative Hoffman's and babinski signs bilaterally.  CN: 2-12 grossly intact.  Coordination: Extremely mild left upper extremity ataxia--ongoing Spasticity: MAS 0 in all extremities.  Strength: 5-/5 LUE, otherwise out of 5 throughout           Assessment/Plan: 1. Functional deficits which require 3+ hours per day of interdisciplinary therapy in a comprehensive inpatient rehab setting. Physiatrist is providing close team supervision and 24 hour management of active medical problems listed below. Physiatrist and rehab team continue to assess barriers to discharge/monitor patient progress toward functional and medical goals  Care Tool:  Bathing    Body parts bathed by patient: Right arm, Left arm, Chest, Abdomen, Front perineal area, Right upper leg, Left upper leg, Face, Buttocks, Left lower leg, Right lower leg   Body parts bathed by helper: Left lower leg, Right lower leg, Buttocks     Bathing assist Assist Level: Independent with assistive device     Upper Body Dressing/Undressing Upper body dressing   What is the patient wearing?: Pull over  shirt    Upper body assist Assist Level: Independent    Lower Body Dressing/Undressing Lower body dressing      What is the patient wearing?: Pants     Lower body assist Assist for lower body dressing: Independent     Toileting Toileting    Toileting assist Assist for toileting: Independent     Transfers Chair/bed transfer  Transfers assist     Chair/bed transfer assist level: Independent with assistive device     Locomotion Ambulation   Ambulation assist      Assist level: Supervision/Verbal cueing Assistive device: No Device Max distance: 200   Walk 10 feet activity   Assist     Assist level: Supervision/Verbal  cueing Assistive device: No Device   Walk 50 feet activity   Assist    Assist level: Supervision/Verbal cueing Assistive device: No Device    Walk 150 feet activity   Assist    Assist level: Supervision/Verbal cueing Assistive device: No Device    Walk 10 feet on uneven surface  activity   Assist     Assist level: Minimal Assistance - Patient > 75%     Wheelchair     Assist Is the patient using a wheelchair?: No             Wheelchair 50 feet with 2 turns activity    Assist            Wheelchair 150 feet activity     Assist          Blood pressure 122/74, pulse 74, temperature 98 F (36.7 C), resp. rate 16, height 6' 2 (1.88 m), weight 120.9 kg, SpO2 96%.  Medical Problem List and Plan: 1. Functional deficits secondary to right frontal glioblastoma s/p crani and resection             -patient may shower -ELOS/Goals: 10-14 days, supervision goals with PT, OT, SLP- DC date 1/5  - stable to continue CIR  2.  Left peroneal DVT/Antithrombotics: -DVT/anticoagulation: on IV heparin  and to transition to DOAC today. -->  Eliquis  5 mg twice daily; hemoglobin stable on admission--stable 1-2             -antiplatelet therapy: N/A  3. Pain Management: Continue hydrocodone  prn.    - 1/3: DC hydrocodone , has PRN tylenol   4. Mood/Behavior/Sleep:LCSW to follow for evaluation and support.  --continue Ambien  for sleep. --Endorses sleeping well.             -antipsychotic agents: N/A   5. Neuropsych/cognition: This patient is capable of making decisions on his own behalf. 6. Skin/Wound Care: Monitor incision for healing. Routine pressure relief measures.   -Postop day #9, remove staples tomorrow a.m. 1-2--add lidocaine  gel prestaple removal per patient request.   - 1/3: Allergic rash - benadryl  25 mg Q6H PRN, sarna lotion to back,  standing allegra  60 mg BID -- no recent medication additions or obvious causes -- monitor-- not seen 12/30/23  7.  Fluids/Electrolytes/Nutrition: Monitor I/O. Check CMET in am -A.m. labs stable.  8. Seizure prophylaxis: On Keppra  500 mg BID. Decadron  weaned off 12/31 am.   - 1/1: No headaches or neurodeficits off of Decadron ; monitor closely  1-2: Stable.  9. Steroid induced hyperglycemia: Hgb A1C-5.4. has been on novolog  5 units TID-->will d/c and use SSI for elevated BS 10.  HTN: Monitor BP TID--SBP goal < 150.  --Was on Dyazide  PTA-->resume--BP well controlled monitor for now -12/30/23 BP fine, cont monitoring  Vitals:  12/26/23 1414 12/26/23 1414 12/26/23 1929 12/27/23 0627  BP: (!) 141/86 (!) 141/86 124/73 117/70   12/27/23 1304 12/27/23 1957 12/28/23 0546 12/28/23 1540  BP: 120/72 115/67 110/68 118/77   12/28/23 2027 12/29/23 0605 12/29/23 1559 12/30/23 0559  BP: 135/75 112/73 (!) 129/90 122/74      11. OSA: Continue CPAP--has been compliant at home also. 12. Constipation Continue Senna. Miralax  was added on 12/31 as last BM 12/28  -1-1: Increase senna to 2 tabs daily; MiraLAX  as needed per patient request  -12/30/23 LBM yesterday, cont regimen  13. Leucocytosis: Likely reactive due to steroids. Monitor for fevers and other signs of infection. Stable 11 on admission.  -Resolved 1-2  14. Polycythemia: Hgb 17.3 - stable 1/1, 1-2 15. H/o Iron deficiency/ S/p gastric sleeve: Resume multivitamin.         LOS: 4 days A FACE TO FACE EVALUATION WAS PERFORMED  [address] 12/30/2023, 11:22 AM

## 2023-12-30 NOTE — Progress Notes (Signed)
 Patient complained of localized sharp chest pain at the center of the chest, mercedes made aware.

## 2023-12-30 NOTE — Progress Notes (Signed)
 Occupational Therapy Discharge Summary  Patient Details  Name: Nicholas Stevens MRN: [id number] Date of Birth: 04-12-1971  Date of Discharge from OT service:December 30, 2023  Today's Date: 12/30/2023 OT Individual Time: 1420-1530 OT Individual Time Calculation (min): 70 min   Patient has met 3 of 3 long term goals due to improved activity tolerance, improved balance, postural control, ability to compensate for deficits, functional use of  LEFT upper and LEFT lower extremity, improved awareness, and improved coordination.  Patient to discharge at overall Modified Independent level.  Patient's care partner is independent to provide the necessary physical assistance at discharge.  Nicholas Stevens has made great progress toward his OT goals and is at a mod I level with his ADL and transfers. He has an incredibly supportive wife and family who will assist at d/c.    Recommendation:  Patient will benefit from ongoing skilled OT services in outpatient setting to continue to advance functional skills in the area of BADL and iADL.  Equipment: No equipment provided  Reasons for discharge: treatment goals met and discharge from hospital  Patient/family agrees with progress made and goals achieved: Yes   Skilled OT Intervention Pt received sitting up with no c/o pain, agreeable to OT session. Session focused on gross strengthening, coordination, and increasing functional activity tolerance, all for direct carryover to ADL performance improvement and transfers. 200 ft of functional mobility to the therapy gym with mod I. He completed glute med and max strengthening in standing with 5 lb ankle weights, to challenge dynamic standing balance and posterior chain strengthening for fall risk reduction. He then completed super set core stabilization in standing with marches, and then full sit ups from the mat. Both with graded resistance to add cardiovascular challenge as well. (S) required. He then completed BUE  coordination activity with a weighted 3lb ball, alternating R and L going overhead to 120 for a sustained 1 minute. About 75% accurate, good challenge for coordination on the L especially. He completed 3x200 ft farmers carry with two 25 lb kettle bells for direct carryover to IADLs, like grocery carrying and laundry. Seated rest breaks between each trial. Lastly he completed 3x10 repetitions of functional step ups holding 2 lb dumbbells to challenge global strengthening and dynamic balance. He was left sitting up in the recliner with his wife present.     OT Discharge Precautions/Restrictions  Precautions Precautions: Fall Restrictions Weight Bearing Restrictions Per Provider Order: No  ADL ADL Eating: Independent Where Assessed-Eating: Edge of bed Grooming: Independent Where Assessed-Grooming: Edge of bed Upper Body Bathing: Independent Where Assessed-Upper Body Bathing: Shower Lower Body Bathing: Independent Where Assessed-Lower Body Bathing: Shower Upper Body Dressing: Independent Where Assessed-Upper Body Dressing: Edge of bed Lower Body Dressing: Independent Where Assessed-Lower Body Dressing: Edge of bed Toileting: Independent Where Assessed-Toileting: Teacher, Adult Education: Community Education Officer Method: Proofreader: Chiropractor Transfer: Modified independent Web Designer Method: Event Organiser: Modified independent Film/video Editor Method: Designer, Industrial/product: Information systems manager with back Vision Baseline Vision/History: 1 Wears glasses Patient Visual Report: No change from baseline Perception  Perception: Impaired Perception-Other Comments: Lt sided inattention, much improved from eval Praxis Praxis: WFL Cognition Cognition Overall Cognitive Status: Within Functional Limits for tasks assessed Arousal/Alertness: Awake/alert Orientation Level: Person;Place;Situation Person:  Oriented Place: Oriented Situation: Oriented Memory: Appears intact Attention: Alternating Awareness: Appears intact Problem Solving: Appears intact Safety/Judgment: Appears intact Brief Interview for Mental Status (BIMS) Repetition of Three Words (First Attempt): 3 Temporal Orientation: Year:  Correct Temporal Orientation: Month: Accurate within 5 days Temporal Orientation: Day: Correct Recall: Sock: Yes, no cue required Recall: Blue: Yes, no cue required Recall: Bed: Yes, no cue required BIMS Summary Score: 15 Sensation Sensation Light Touch: Appears Intact Hot/Cold: Appears Intact Additional Comments: Inattention left, limited spontaneous movement in LUE, e.g. with walking, gesturing Coordination Gross Motor Movements are Fluid and Coordinated: Yes Fine Motor Movements are Fluid and Coordinated: No Coordination and Movement Description: using L hand functionally in self care tasks but has delayed movement with gross motor coordination challenges (ie arm swing with walking) Motor  Motor Motor: Hemiplegia Motor - Discharge Observations: very mild L hemi, much improved from eval Mobility  Bed Mobility Bed Mobility: Sit to Supine;Supine to Sit Rolling Right: Independent Right Sidelying to Sit: Independent Supine to Sit: Independent Sit to Supine: Independent Transfers Sit to Stand: Independent Stand to Sit: Independent  Trunk/Postural Assessment  Cervical Assessment Cervical Assessment: Within Functional Limits Thoracic Assessment Thoracic Assessment: Within Functional Limits Lumbar Assessment Lumbar Assessment: Within Functional Limits Postural Control Postural Control: Deficits on evaluation Righting Reactions: delayed  Balance Balance Balance Assessed: Yes Standardized Balance Assessment Standardized Balance Assessment: Berg Balance Test;Functional Gait Assessment;Timed Up and Go Test Berg Balance Test Sit to Stand: Able to stand without using hands and  stabilize independently Standing Unsupported: Able to stand safely 2 minutes Sitting with Back Unsupported but Feet Supported on Floor or Stool: Able to sit safely and securely 2 minutes Stand to Sit: Sits safely with minimal use of hands Transfers: Able to transfer safely, minor use of hands Standing Unsupported with Eyes Closed: Able to stand 10 seconds safely Standing Ubsupported with Feet Together: Able to place feet together independently and stand 1 minute safely From Standing, Reach Forward with Outstretched Arm: Can reach confidently >25 cm (10) From Standing Position, Pick up Object from Floor: Able to pick up shoe safely and easily From Standing Position, Turn to Look Behind Over each Shoulder: Looks behind from both sides and weight shifts well Turn 360 Degrees: Able to turn 360 degrees safely in 4 seconds or less Standing Unsupported, Alternately Place Feet on Step/Stool: Able to stand independently and safely and complete 8 steps in 20 seconds Standing Unsupported, One Foot in Front: Able to plae foot ahead of the other independently and hold 30 seconds Standing on One Leg: Able to lift leg independently and hold equal to or more than 3 seconds Total Score: 53 Timed Up and Go Test TUG: Normal TUG Normal TUG (seconds): 9.8 Static Sitting Balance Static Sitting - Balance Support: No upper extremity supported;Feet supported Static Sitting - Level of Assistance: 7: Independent Dynamic Sitting Balance Dynamic Sitting - Balance Support: No upper extremity supported;Feet supported;During functional activity Dynamic Sitting - Level of Assistance: 7: Independent Static Standing Balance Static Standing - Balance Support: No upper extremity supported;During functional activity Static Standing - Level of Assistance: 6: Modified independent (Device/Increase time) Dynamic Standing Balance Dynamic Standing - Balance Support: No upper extremity supported;During functional activity Dynamic  Standing - Level of Assistance: 6: Modified independent (Device/Increase time) Functional Gait  Assessment Gait assessed : Yes Gait Level Surface: Walks 20 ft in less than 5.5 sec, no assistive devices, good speed, no evidence for imbalance, normal gait pattern, deviates no more than 6 in outside of the 12 in walkway width. Change in Gait Speed: Able to smoothly change walking speed without loss of balance or gait deviation. Deviate no more than 6 in outside of the 12 in walkway width.  Gait with Horizontal Head Turns: Performs head turns smoothly with slight change in gait velocity (eg, minor disruption to smooth gait path), deviates 6-10 in outside 12 in walkway width, or uses an assistive device. Gait with Vertical Head Turns: Performs task with slight change in gait velocity (eg, minor disruption to smooth gait path), deviates 6 - 10 in outside 12 in walkway width or uses assistive device Gait and Pivot Turn: Pivot turns safely within 3 sec and stops quickly with no loss of balance. Step Over Obstacle: Is able to step over one shoe box (4.5 in total height) without changing gait speed. No evidence of imbalance. Gait with Narrow Base of Support: Ambulates 4-7 steps. Gait with Eyes Closed: Walks 20 ft, no assistive devices, good speed, no evidence of imbalance, normal gait pattern, deviates no more than 6 in outside 12 in walkway width. Ambulates 20 ft in less than 7 sec. Ambulating Backwards: Walks 20 ft, no assistive devices, good speed, no evidence for imbalance, normal gait Steps: Alternating feet, must use rail. Total Score: 24 Extremity/Trunk Assessment RUE Assessment RUE Assessment: Within Functional Limits LUE Assessment LUE Assessment: Exceptions to [facility] Active Range of Motion (AROM) Comments: full AROM to 160 General Strength Comments: 4-/5 LUE Body System: Neuro Brunstrum levels for arm and hand: Arm;Hand Brunstrum level for arm: Stage V Relative Independence from Synergy Brunstrum  level for hand: Stage VI Isolated joint movements   Nena VEAR Moats 12/30/2023, 10:42 AM

## 2023-12-31 LAB — GLUCOSE, CAPILLARY: Glucose-Capillary: 102 mg/dL — ABNORMAL HIGH (ref 70–99)

## 2023-12-31 NOTE — Progress Notes (Signed)
 Inpatient Rehabilitation Discharge Medication Review by a Pharmacist  A complete drug regimen review was completed for this patient to identify any potential clinically significant medication issues.   High Risk Drug Classes Is patient taking? Indication by Medication  Antipsychotic No    Anticoagulant Yes Apixaban  - DVT   Antibiotic No    Opioid No    Antiplatelet No    Hypoglycemics/insulin  No    Vasoactive Medication No    Chemotherapy No    Other Yes Fexofenadine - allergies Fish oil-fatty acid supplement Keppra - seizure prophylaxis  Latanoprost  ophthalmic solution drops-glaucoma  Pantoprazole - reflux  Pimecrolimus  (Elidel ) cream- dermatitis  MVI , Vitamin B12- supplement     Sarna lotion-itchin Senokot-S- constipation Zolpidem  PRN- sleep         Type of Medication Issue Identified Description of Issue Recommendation(s)  Drug Interaction(s) (clinically significant)        Duplicate Therapy        Allergy        No Medication Administration End Date        Incorrect Dose        Additional Drug Therapy Needed        Significant med changes from prior encounter (inform family/care partners about these prior to discharge). PTA med: Vitamin D  discontinued.      Other            Clinically significant medication issues were identified that warrant physician communication and completion of prescribed/recommended actions by midnight of the next day:  No   Name of provider notified for urgent issues identified:    Provider Method of Notification:     Pharmacist comments:   Time spent performing this drug regimen review (minutes): 25   Levorn Gaskins, RPh Clinical Pharmacist 12/31/2023 8:32 AM

## 2023-12-31 NOTE — Progress Notes (Signed)
 PROGRESS NOTE   Subjective/Complaints:  Pt doing great again and ready to go! Slept well, denies pain, LBM yesterday, urinating fine. RN called last night stating patient had 30 seconds of sharp pain in the center of his chest that fully resolved before calling me. Per report, it didn't feel heart related, and it did not persist or return. Pt states it seemed to be gas pain, and never returned, doesn't feel it was concerning. Denies any other complaints or concerns today.   ROS:  +rash-resolved Denies fevers, chills, N/V, abdominal pain, constipation, diarrhea, SOB, cough, chest pain, new weakness or paraesthesias.    Objective:   No results found.  No results for input(s): WBC, HGB, HCT, PLT in the last 72 hours.  No results for input(s): NA, K, CL, CO2, GLUCOSE, BUN, CREATININE, CALCIUM in the last 72 hours.   Intake/Output Summary (Last 24 hours) at 12/31/2023 0957 Last data filed at 12/31/2023 0700 Gross per 24 hour  Intake 237 ml  Output --  Net 237 ml        Physical Exam: Vital Signs Blood pressure 118/83, pulse 63, temperature 98.7 F (37.1 C), temperature source Oral, resp. rate 16, height 6' 2 (1.88 m), weight 120.9 kg, SpO2 97%.  Physical Exam Constitutional: No apparent distress. Appropriate appearance for age.  Sitting up in bed.  HENT: Mild left eyebrow droop.  + Left hemicrani incision nearly fully healed Eyes: PERRLA. EOMI. Visual fields grossly intact.  Cardiovascular: RRR, no murmurs/rub/gallops. No Edema. Peripheral pulses 2+  Respiratory: CTAB. No rales, rhonchi, or wheezing. On RA.  Abdomen: + bowel sounds, normoactive. No distention or tenderness.  Skin: no rash  Left scalp surgical incision without staples, well-approximated, no drainage or dehiscence  PRIOR EXAMS: Skin:Fine, maculopapular rash across face, forearms, chest, abdomen and back. No warmth or  drainage. MSK:      No apparent deformity.  Moving all 4 extremities with full active range of motion.       Neurologic exam:  Cognition: AAO to person, place, time and event.  Language: Fluent, No substitutions or neoglisms. No dysarthria.  Memory:  No apparent deficits  Insight: Good  insight into current condition.  Mood: Pleasant affect, appropriate mood.  Sensation: To light touch slightly reduced in distal right lower extremity; otherwise intact Reflexes: 2+ in BL UE and LEs. Negative Hoffman's and babinski signs bilaterally.  CN: 2-12 grossly intact.  Coordination: Extremely mild left upper extremity ataxia--ongoing Spasticity: MAS 0 in all extremities.  Strength: 5-/5 LUE, otherwise out of 5 throughout           Assessment/Plan: 1. Functional deficits which require 3+ hours per day of interdisciplinary therapy in a comprehensive inpatient rehab setting. Physiatrist is providing close team supervision and 24 hour management of active medical problems listed below. Physiatrist and rehab team continue to assess barriers to discharge/monitor patient progress toward functional and medical goals  Care Tool:  Bathing    Body parts bathed by patient: Right arm, Left arm, Chest, Abdomen, Front perineal area, Right upper leg, Left upper leg, Face, Buttocks, Left lower leg, Right lower leg   Body parts bathed by helper: Left lower leg, Right lower  leg, Buttocks     Bathing assist Assist Level: Independent with assistive device     Upper Body Dressing/Undressing Upper body dressing   What is the patient wearing?: Pull over shirt    Upper body assist Assist Level: Independent    Lower Body Dressing/Undressing Lower body dressing      What is the patient wearing?: Pants     Lower body assist Assist for lower body dressing: Independent     Toileting Toileting    Toileting assist Assist for toileting: Independent     Transfers Chair/bed transfer  Transfers  assist     Chair/bed transfer assist level: Independent with assistive device     Locomotion Ambulation   Ambulation assist      Assist level: Supervision/Verbal cueing Assistive device: No Device Max distance: 300'   Walk 10 feet activity   Assist     Assist level: Supervision/Verbal cueing Assistive device: No Device   Walk 50 feet activity   Assist    Assist level: Supervision/Verbal cueing Assistive device: No Device    Walk 150 feet activity   Assist    Assist level: Supervision/Verbal cueing Assistive device: No Device    Walk 10 feet on uneven surface  activity   Assist     Assist level: Supervision/Verbal cueing     Wheelchair     Assist Is the patient using a wheelchair?: No             Wheelchair 50 feet with 2 turns activity    Assist            Wheelchair 150 feet activity     Assist          Blood pressure 118/83, pulse 63, temperature 98.7 F (37.1 C), temperature source Oral, resp. rate 16, height 6' 2 (1.88 m), weight 120.9 kg, SpO2 97%.  Medical Problem List and Plan: 1. Functional deficits secondary to right frontal glioblastoma s/p crani and resection             -patient may shower -ELOS/Goals: 10-14 days, supervision goals with PT, OT, SLP- DC date 1/5  - D/C today 12/31/23  2.  Left peroneal DVT/Antithrombotics: -DVT/anticoagulation: on IV heparin  and to transition to DOAC today. -->  Eliquis  5 mg twice daily; hemoglobin stable on admission--stable 1-2             -antiplatelet therapy: N/A  3. Pain Management: Continue hydrocodone  prn.    - 1/3: DC hydrocodone , has PRN tylenol   4. Mood/Behavior/Sleep:LCSW to follow for evaluation and support.  --continue Ambien  for sleep. --Endorses sleeping well.             -antipsychotic agents: N/A   5. Neuropsych/cognition: This patient is capable of making decisions on his own behalf. 6. Skin/Wound Care: Monitor incision for healing. Routine  pressure relief measures.   -Postop day #9, remove staples tomorrow a.m. 1-2--add lidocaine  gel prestaple removal per patient request.   - 1/3: Allergic rash - benadryl  25 mg Q6H PRN, sarna lotion to back,  standing allegra  60 mg BID -- no recent medication additions or obvious causes -- monitor-- resolved 12/31/23  7. Fluids/Electrolytes/Nutrition: Monitor I/O. Check CMET in am -A.m. labs stable.  8. Seizure prophylaxis: On Keppra  500 mg BID. Decadron  weaned off 12/31 am.   - 1/1: No headaches or neurodeficits off of Decadron ; monitor closely  1-2: Stable.  9. Steroid induced hyperglycemia: Hgb A1C-5.4. has been on novolog  5 units TID-->will d/c and use SSI for  elevated BS 10.  HTN: Monitor BP TID--SBP goal < 150.  --Was on Dyazide  PTA-->resume--BP well controlled monitor for now -1/4-5/25 BP fine, cont monitoring  Vitals:   12/27/23 0627 12/27/23 1304 12/27/23 1957 12/28/23 0546  BP: 117/70 120/72 115/67 110/68   12/28/23 1540 12/28/23 2027 12/29/23 0605 12/29/23 1559  BP: 118/77 135/75 112/73 (!) 129/90   12/30/23 0559 12/30/23 1307 12/30/23 1941 12/31/23 0442  BP: 122/74 119/76 105/69 118/83      11. OSA: Continue CPAP--has been compliant at home also. 12. Constipation Continue Senna. Miralax  was added on 12/31 as last BM 12/28  -1-1: Increase senna to 2 tabs daily; MiraLAX  as needed per patient request  -12/31/23 LBM yesterday, cont regimen at home  13. Leucocytosis: Likely reactive due to steroids. Monitor for fevers and other signs of infection. Stable 11 on admission.  -Resolved 1-2  14. Polycythemia: Hgb 17.3 - stable 1/1, 1-2 15. H/o Iron deficiency/ S/p gastric sleeve: Resume multivitamin.         LOS: 5 days A FACE TO FACE EVALUATION WAS PERFORMED  [id number] Donterrius Santucci 12/31/2023, 9:57 AM

## 2024-01-01 ENCOUNTER — Other Ambulatory Visit (HOSPITAL_COMMUNITY): Payer: Self-pay

## 2024-01-01 ENCOUNTER — Other Ambulatory Visit: Payer: Self-pay

## 2024-01-01 NOTE — Progress Notes (Signed)
 Inpatient Rehabilitation Care Coordinator Discharge Note   Patient Details  Name: Nicholas Stevens MRN: [id number] Date of Birth: 12/17/1971   Discharge location: D/c to home with his wife  Length of Stay: 4 days  Discharge activity level: Supervision  Home/community participation: Limited  Patient response un:Yzjouy Literacy - How often do you need to have someone help you when you read instructions, pamphlets, or other written material from your doctor or pharmacy?: Never  Patient response un:Dnrpjo Isolation - How often do you feel lonely or isolated from those around you?: Never  Services provided included: MD, RD, PT, OT, SLP, RN, CM, TR, Pharmacy, Neuropsych, SW  Financial Services:  Field Seismologist Utilized: Consulting Civil Engineer  Choices offered to/list presented to: patient and pt wife  Follow-up services arranged:  Outpatient    Outpatient Servicies: [facility] for PT/OT      Patient response to transportation need: Is the patient able to respond to transportation needs?: Yes In the past 12 months, has lack of transportation kept you from medical appointments or from getting medications?: No In the past 12 months, has lack of transportation kept you from meetings, work, or from getting things needed for daily living?: No   Patient/Family verbalized understanding of follow-up arrangements:  Yes  Individual responsible for coordination of the follow-up plan: contact pt wife [phone]  Confirmed correct DME delivered: Graeme DELENA Jude 01/01/2024    Comments:  Summary of Stay    Date/Time Discharge Planning CSW  12/29/23 1403 D/c to home with his wife. Outpatient PT/OT/SLP- [facility]. No DME recommended. PRN support from family. AAC       Pinchas Reither A Jude

## 2024-01-02 ENCOUNTER — Telehealth: Payer: Self-pay | Admitting: *Deleted

## 2024-01-02 ENCOUNTER — Encounter: Payer: Self-pay | Admitting: Occupational Therapy

## 2024-01-02 ENCOUNTER — Telehealth: Payer: Self-pay | Admitting: Internal Medicine

## 2024-01-02 ENCOUNTER — Ambulatory Visit: Payer: Commercial Managed Care - PPO | Attending: Physician Assistant | Admitting: Physical Therapy

## 2024-01-02 ENCOUNTER — Ambulatory Visit: Payer: Commercial Managed Care - PPO | Admitting: Occupational Therapy

## 2024-01-02 DIAGNOSIS — R29818 Other symptoms and signs involving the nervous system: Secondary | ICD-10-CM

## 2024-01-02 DIAGNOSIS — I69252 Hemiplegia and hemiparesis following other nontraumatic intracranial hemorrhage affecting left dominant side: Secondary | ICD-10-CM | POA: Diagnosis present

## 2024-01-02 DIAGNOSIS — R2681 Unsteadiness on feet: Secondary | ICD-10-CM | POA: Insufficient documentation

## 2024-01-02 DIAGNOSIS — R29898 Other symptoms and signs involving the musculoskeletal system: Secondary | ICD-10-CM

## 2024-01-02 DIAGNOSIS — R41842 Visuospatial deficit: Secondary | ICD-10-CM | POA: Diagnosis present

## 2024-01-02 DIAGNOSIS — R278 Other lack of coordination: Secondary | ICD-10-CM

## 2024-01-02 DIAGNOSIS — R2689 Other abnormalities of gait and mobility: Secondary | ICD-10-CM | POA: Diagnosis present

## 2024-01-02 DIAGNOSIS — M6281 Muscle weakness (generalized): Secondary | ICD-10-CM

## 2024-01-02 NOTE — Telephone Encounter (Signed)
 PC to patient's wife Adonna - she contacted after hours line on 01/01/24 stating that the patient was out of Keppra .  Today she states she realized the keppra  rx was sent to a different pharmacy & that she picked up the medication.  The patient missed one dose but is currently taking as prescribed.

## 2024-01-02 NOTE — Therapy (Signed)
 OUTPATIENT OCCUPATIONAL THERAPY NEURO EVALUATION  Patient Name: Nicholas Stevens MRN: [id number] DOB:[birth year], 53 y.o., male Today's Date: 01/02/2024  PCP: Garald Karlynn GAILS, MD REFERRING PROVIDER: Pegge Toribio PARAS, PA-C   END OF SESSION:  OT End of Session - 01/02/24 1150     Visit Number 1    Number of Visits 16    Date for OT Re-Evaluation 03/22/24    Authorization Type [employer]    OT Start Time 1150    OT Stop Time 1245    OT Time Calculation (min) 55 min    Equipment Utilized During Treatment Testing Material    Activity Tolerance Patient tolerated treatment well    Behavior During Therapy WFL for tasks assessed/performed             Past Medical History:  Diagnosis Date   DDD (degenerative disc disease), lumbar    HNP (herniated nucleus pulposus)    Hypertension    Liver hemangioma    Morbid obesity (HCC)    OSA (obstructive sleep apnea)    no cpap used since weight loss   Overweight(278.02)    Plantar fasciitis of right foot    Sleep apnea    Tinea barbae    Past Surgical History:  Procedure Laterality Date   BREATH TEK H PYLORI N/A 08/20/2013   Procedure: BREATH TEK H PYLORI;  Surgeon: Donnice KATHEE Lunger, MD;  Location: [facility];  Service: General;  Laterality: N/A;   COLONOSCOPY WITH PROPOFOL  N/A 05/05/2022   Procedure: COLONOSCOPY WITH PROPOFOL ;  Surgeon: Albertus Gordy HERO, MD;  Location: [facility];  Service: Gastroenterology;  Laterality: N/A;   CRANIOTOMY Right 12/18/2023   Procedure: Right Frontal Stereotactic Craniotomy for Resection of Tumor;  Surgeon: Debby Dorn MATSU, MD;  Location: [facility] OR;  Service: Neurosurgery;  Laterality: Right;   ESOPHAGOGASTRODUODENOSCOPY (EGD) WITH PROPOFOL  N/A 03/15/2023   Procedure: ESOPHAGOGASTRODUODENOSCOPY (EGD) WITH PROPOFOL ;  Surgeon: Avram Lupita BRAVO, MD;  Location: [facility];  Service: Gastroenterology;  Laterality: N/A;   LAPAROSCOPIC APPENDECTOMY  2007   Dr Curvin   LAPAROSCOPIC GASTRIC  SLEEVE RESECTION N/A 10/08/2013   Procedure: LAPAROSCOPIC SLEEVE GASTRECTOMY;  Surgeon: Donnice KATHEE Lunger, MD;  Location: [facility];  Service: General;  Laterality: N/A;   LUMBAR LAMINECTOMY/DECOMPRESSION MICRODISCECTOMY N/A 09/16/2015   Procedure: MICRO LUMBAR DECOMPRESSION, MICRODISCECTOMY L5 - S1;  Surgeon: Reyes Billing, MD;  Location: [facility];  Service: Orthopedics;  Laterality: N/A;   POLYPECTOMY  05/05/2022   Procedure: POLYPECTOMY;  Surgeon: Albertus Gordy HERO, MD;  Location: [facility];  Service: Gastroenterology;;   ROTATOR CUFF REPAIR Right 2005   Dr Harden   Patient Active Problem List   Diagnosis Date Noted   Allergic drug rash 12/29/2023   DVT, lower extremity, distal (HCC) 12/29/2023   Glioma of brain (HCC) 12/26/2023   Brain mass 12/15/2023   Pseudofolliculitis barbae 05/01/2023   Iron deficiency 04/06/2023   Abdominal pain 03/15/2023   Microcytosis 03/14/2023   RUQ pain 03/12/2023   Abnormal EKG 03/12/2023   Chest pain 03/12/2023   Adult acne 01/19/2023   Obesity (BMI 30-39.9) 01/19/2023   Benign neoplasm of cecum    Benign neoplasm of ascending colon    Benign neoplasm of transverse colon    Difficult airway for intubation 01/18/2022   Upper respiratory disease 01/31/2019   Cerumen impaction 07/30/2018   HTN (hypertension) 11/03/2017   Spinal stenosis of lumbar region 09/16/2015   Insomnia 04/15/2015   Well adult exam 11/04/2014   H/O gastric sleeve 10/08/2013  Hypogonadism, male 07/17/2013   Erectile dysfunction 03/22/2013   Concentration deficit 04/27/2011   Attention or concentration deficit 04/27/2011   TINEA BARBAE 03/04/2009   LIVER HEMANGIOMA 03/04/2009   OVERWEIGHT-BMI 42 03/04/2009   OSA on CPAP 03/04/2009   DEGENERATIVE DISC DISEASE, LUMBAR SPINE 03/04/2009    ONSET DATE: 12/27/22 (referral date)  REFERRING DIAG: C71.9 (ICD-10-CM) - Malignant neoplasm of brain, unspecified   THERAPY DIAG:  Muscle weakness (generalized)  Other lack of  coordination  Other symptoms and signs involving the nervous system  Other symptoms and signs involving the musculoskeletal system  Unsteadiness on feet  Rationale for Evaluation and Treatment: Rehabilitation  SUBJECTIVE:   SUBJECTIVE STATEMENT:  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  He was exploring employment with [facility] police services the day before the hospitalization.  He felt off on his drive home from a cognitive test at [facility] and woke up the next day with L sided weakness.  Pt describes himself as always been physically fit prior to this incident.   Pt reports he was able to walk 2 days after surgery with a RW but only for the first day.  Wife reports pt is a walking miracle - doctors did not anticipate the functional progress he has made.   Pt accompanied by: self and significant other - [name]   PERTINENT HISTORY:  PMH includes: HTN, obesity (gastric sleeve), OSA, lumbar DDD.   53 yo male presenting 12/20 with decreased response time while driving, incoordination, imbalance. Pt was admitted to [facility] hospital from ED on 12/15/23;  Imaging showed necrotic and hemorrhagic mass in the superior right frontal lobe with features of glioblastoma with mass effect. underwent craniotomy for brain tumor (with features of gliobastoma per chart note) on 12/18/23;  pt was transferred to inpatient rehab on 12/26/23 and discharged home with wife on 12/31/23.     Per 12/27/23 acute OT evaluation: history of OSA, HTN, obesity s/p gastric sleeve, liver hemangioma who was admitted on 12/15/23 with reports of decrease in coordination, minimal use of LUE/LLE and fall prompting treatment. MRI brain done revealing necrotic, hemorrhagic mass in superior right frontal lobe with features of glioblastoma and mass effect with very early right uncal herniation.  He underwent right frontal stereotactic   craniotomy for resection of frontal lobe tumor on 12/23 by Dr. Debby.  Post op has had improvement in LUE strength and follow up MRI showed good debulking of tumor with decrease in mass effect, collapse of lesion with region of enhancement  and 5 mm right to left shift. Pathology revealed high grade glioma grade 4 c/w glioblastoma.  PRECAUTIONS: Fall and Other: No driving  WEIGHT BEARING RESTRICTIONS: No  PAIN:  Are you having pain? No  FALLS: Has patient fallen in last 6 months? Yes. Number of falls 1   Pt reported he was working the day of the incident and noticed his reaction time was slow.  He just thought his cold medincine was making him slow, but he ended up falling into the nightstand and wall and couldn't get himself up due to L UE/LE weakness prompting ED visit and hospitalization.  LIVING ENVIRONMENT: Lives with: lives with their family, lives with their spouse (high school sweethearts), and lives with youngest 10 yo son - 1 of 3 children) Lives in: House Stairs: Yes: Internal: 14  steps; can reach both and External:  1 threshold  steps; none  Master bedroom on main level  - has not been up steps to 2nd level of home yet as only been home for 2 days  Has following equipment: has built in seat in walk-in shower but not using it   PLOF: Independent - has a home gym, worked in patent examiner and had driven to/from [employer] on the day of the incident  PATIENT GOALS: To improve my strength and balance.  Pt stated he was in process of applying with [employer] [place] police dept on 12-15-23 when onset occurred - would like to be able to take this position if he is able to perform required work activities safely   OBJECTIVE:  Note: Objective measures were completed at Evaluation unless otherwise noted.  HAND DOMINANCE: Right  ADLs: Overall ADLs: Pt has resumed all ADLs on his own, even standing in shower, managing fasteners although they may take a little  longer.  IADLs: Shopping: Goes out with wife but unable to drive Light housekeeping: Wife isn't currently letting him Meal Prep: Wife is still doing meal prep although he was primary with this task previously Community mobility: Ind Medication management: Wife has it set up for him Financial management: Assisting wife, able to recall account numbers etc Handwriting: 100% legible - no change R UE unaffected  MOBILITY STATUS: Independent  POSTURE COMMENTS:  forward head Sitting balance: WNL  ACTIVITY TOLERANCE: Activity tolerance: limited  FUNCTIONAL OUTCOME MEASURES: Lawton-Brody IADL Scale: 3/8    UPPER EXTREMITY ROM:    Active ROM Right eval Left eval  Shoulder flexion WNL Slight end range limitations  Shoulder abduction    Shoulder adduction    Shoulder extension    Shoulder internal rotation    Shoulder external rotation    Elbow flexion    Elbow extension    Wrist flexion    Wrist extension    Wrist ulnar deviation    Wrist radial deviation    Wrist pronation    Wrist supination    (Blank rows = not tested)  UPPER EXTREMITY MMT:     MMT Right eval Left eval  Shoulder flexion 5/5 4/5  Shoulder abduction    Shoulder adduction    Shoulder extension    Shoulder internal rotation    Shoulder external rotation    Middle trapezius    Lower trapezius    Elbow flexion    Elbow extension    Wrist flexion    Wrist extension    Wrist ulnar deviation    Wrist radial deviation    Wrist pronation    Wrist supination    (Blank rows = not tested)  HAND FUNCTION: Grip strength: Right: 60.4 - 1st attempt not included, 82.2, 86.1, 106.0  lbs; Left: 51.3, 54.8, 52.6 lbs Average: Right: 91.4 lbs Left: 52.9 lbs  COORDINATION: 9 Hole Peg test: Right: 19.15 sec; Left: 32.18 sec Box and Blocks:  Right 51 blocks, Left 37 blocks  SENSATION: WFL  EDEMA: NA  MUSCLE TONE: WFL  COGNITION: Overall cognitive status: Within functional limits for tasks  assessed Recalled account number to bills independently VISION: Subjective report: Drops for glaucoma prevention/keep pressure down Baseline vision: Wears glasses for reading only Visual history:  NA  VISION ASSESSMENT: WFL  Patient has difficulty with following activities due to following visual impairments: NA  PERCEPTION: Not tested  PRAXIS: Not tested  OBSERVATIONS at eval: Pt ambulated with use of no AE and no loss of balance but with mildly  decreased L arm swing and what OTR still noted as a slight favoring of R side with gaze. The pt is well kept and was accompanied by his high school sweetheart/wife who did admit to overdoing it when helping him at times when pointed out by OTR.                                                                                                                            TREATMENT DATE: 01/02/24   Therapeutic Activities - Education initiated re: functional activities to work on L UE strength and coordination through daily activities with no handouts provided on this date ie) carrying groceries, [employer], [employer] games, cards etc.  PATIENT EDUCATION: Education details: OT role, POC considerations and HEP ideas Person educated: Patient and Spouse Education method: Explanation and Verbal cues Education comprehension: verbalized understanding and needs further education  HOME EXERCISE PROGRAM: TBA  GOALS: Goals reviewed with patient? Yes in general but will review specific goals at next visit  LONG TERM GOALS: Target date: 02/02/24  Patient will demonstrate initial LUE strength/coordination HEP with visual handouts only for proper execution to resume use of home gym including [employer] etc. Baseline: New to outpt OT Goal status: INITIAL  2.  Patient will demonstrate at least 70+ lbs LUE grip strength as needed to open jars and other containers. Baseline: 52.9 lbs Goal status: INITIAL  3.  Patient will demo improved FM coordination as evidenced  by completing nine-hole peg with use of LUE in 25 seconds or less. Baseline: 32.18 Goal status: INITIAL  4.  Pt will be able to place at least 42+ blocks using left hand with completion of Box and Blocks test. Baseline: 37 blocks Goal status: INITIAL  5.  Patient will demonstrate at least 95% accuracy with environmental visual scanning in distracting environment to assist with ADLs and IADLS including eventual driving considerations and work-related tasks as hydrographic surveyor.   Baseline: New to outpt OT Goal status: INITIAL  6.  Pt will have adequate standing tolerance and balance to safely complete IADLS without supervision including preparing meals per PLOF. Baseline: Spouse is preparing meals. Brody-Lawton Scale 3/8 Goal status: INITIAL  7. Patient will demo improved BUE FM coordination as evidenced by independence with sorting medication on his own without dropping pills.  Baseline: Wife is assisting him Goal status: INITIAL  ASSESSMENT:  CLINICAL IMPRESSION: Patient is a 53 y.o. male who was seen today for occupational therapy evaluation for LUE deficits s/p craniotomy for resection of frontal lobe tumor on 12/23. Hx includes HTN, obesity (gastric sleeve), OSA, lumbar DDD. Patient currently presents slightly baseline level of function with L UE strength and coordination although he has resumed ADLS, he has not resumed IADLS and needs to make further improvements to resume work. Pt would benefit from skilled OT services in the outpatient setting to work on impairments as noted below to help pt return to PLOF as able.    PERFORMANCE  DEFICITS: in functional skills including IADLs, coordination, dexterity, proprioception, ROM, strength, Fine motor control, Gross motor control, balance, endurance, vision, and UE functional use, cognitive skills including attention, and psychosocial skills including coping strategies and routines and behaviors.   IMPAIRMENTS: are limiting patient  from IADLs, work, leisure, and social participation.   CO-MORBIDITIES: may have co-morbidities  that affects occupational performance. Patient will benefit from skilled OT to address above impairments and improve overall function.  MODIFICATION OR ASSISTANCE TO COMPLETE EVALUATION: No modification of tasks or assist necessary to complete an evaluation.  OT OCCUPATIONAL PROFILE AND HISTORY: Problem focused assessment: Including review of records relating to presenting problem.  CLINICAL DECISION MAKING: LOW - limited treatment options, no task modification necessary  REHAB POTENTIAL: Excellent  EVALUATION COMPLEXITY: Low    PLAN:  OT FREQUENCY: 1-2x/week  OT DURATION: up to 6 weeks but planning for 4 weeks  PLANNED INTERVENTIONS: 97535 self care/ADL training, [id number] therapeutic exercise, 97530 therapeutic activity, 97112 neuromuscular re-education, functional mobility training, coping strategies training, and patient/family education  RECOMMENDED OTHER SERVICES: PT evaluation occurred date of eval also  CONSULTED AND AGREED WITH PLAN OF CARE: Patient and family member/caregiver  PLAN FOR NEXT SESSION:  Strengthening HEP - high level LUE shoulder stability Coordination HEP (manage firearm for work) Scanning with distractions/Reaction times Standing balance and activity tolerance for cooking   Clarita LITTIE Pride, OT 01/02/2024, 2:00 PM

## 2024-01-02 NOTE — Therapy (Signed)
 OUTPATIENT PHYSICAL THERAPY NEURO EVALUATION   Patient Name: Nicholas Stevens MRN: [id number] DOB:February 10, 1971, 53 y.o., male Today's Date: 01/03/2024   PCP: Loman Karlynn GAILS., MD REFERRING PROVIDER: Pegge Toribio PARAS, PA-C  END OF SESSION:  PT End of Session - 01/03/24 1114     Visit Number 1    Number of Visits 13    Date for PT Re-Evaluation 02/02/24   pushed out 1 week due to scheduling   Authorization Type [facility]    Authorization Time Period 01-02-24 - 03-01-24    PT Start Time 1107   late arrival   PT Stop Time 1147    PT Time Calculation (min) 40 min    Equipment Utilized During Treatment Gait belt    Activity Tolerance Patient tolerated treatment well    Behavior During Therapy WFL for tasks assessed/performed             Past Medical History:  Diagnosis Date   DDD (degenerative disc disease), lumbar    HNP (herniated nucleus pulposus)    Hypertension    Liver hemangioma    Morbid obesity (HCC)    OSA (obstructive sleep apnea)    no cpap used since weight loss   Overweight(278.02)    Plantar fasciitis of right foot    Sleep apnea    Tinea barbae    Past Surgical History:  Procedure Laterality Date   BREATH TEK H PYLORI N/A 08/20/2013   Procedure: BREATH TEK VEAR [name];  Surgeon: Donnice KATHEE Lunger, MD;  Location: [facility];  Service: General;  Laterality: N/A;   COLONOSCOPY WITH PROPOFOL  N/A 05/05/2022   Procedure: COLONOSCOPY WITH PROPOFOL ;  Surgeon: Albertus Gordy HERO, MD;  Location: [facility];  Service: Gastroenterology;  Laterality: N/A;   CRANIOTOMY Right 12/18/2023   Procedure: Right Frontal Stereotactic Craniotomy for Resection of Tumor;  Surgeon: Debby Dorn MATSU, MD;  Location: [facility];  Service: Neurosurgery;  Laterality: Right;   ESOPHAGOGASTRODUODENOSCOPY (EGD) WITH PROPOFOL  N/A 03/15/2023   Procedure: ESOPHAGOGASTRODUODENOSCOPY (EGD) WITH PROPOFOL ;  Surgeon: Avram Lupita BRAVO, MD;  Location: [facility];  Service: Gastroenterology;  Laterality: N/A;    LAPAROSCOPIC APPENDECTOMY  2007   Dr Curvin   LAPAROSCOPIC GASTRIC SLEEVE RESECTION N/A 10/08/2013   Procedure: LAPAROSCOPIC SLEEVE GASTRECTOMY;  Surgeon: Donnice KATHEE Lunger, MD;  Location: [facility];  Service: General;  Laterality: N/A;   LUMBAR LAMINECTOMY/DECOMPRESSION MICRODISCECTOMY N/A 09/16/2015   Procedure: MICRO LUMBAR DECOMPRESSION, MICRODISCECTOMY L5 - S1;  Surgeon: Reyes Billing, MD;  Location: [facility];  Service: Orthopedics;  Laterality: N/A;   POLYPECTOMY  05/05/2022   Procedure: POLYPECTOMY;  Surgeon: Albertus Gordy HERO, MD;  Location: [facility];  Service: Gastroenterology;;   ROTATOR CUFF REPAIR Right 2005   Dr Harden   Patient Active Problem List   Diagnosis Date Noted   Allergic drug rash 12/29/2023   DVT, lower extremity, distal (HCC) 12/29/2023   Glioma of brain (HCC) 12/26/2023   Brain mass 12/15/2023   Pseudofolliculitis barbae 05/01/2023   Iron deficiency 04/06/2023   Abdominal pain 03/15/2023   Microcytosis 03/14/2023   RUQ pain 03/12/2023   Abnormal EKG 03/12/2023   Chest pain 03/12/2023   Adult acne 01/19/2023   Obesity (BMI 30-39.9) 01/19/2023   Benign neoplasm of cecum    Benign neoplasm of ascending colon    Benign neoplasm of transverse colon    Difficult airway for intubation 01/18/2022   Upper respiratory disease 01/31/2019   Cerumen impaction 07/30/2018   HTN (hypertension) 11/03/2017   Spinal stenosis  of lumbar region 09/16/2015   Insomnia 04/15/2015   Well adult exam 11/04/2014   H/O gastric sleeve 10/08/2013   Hypogonadism, male 07/17/2013   Erectile dysfunction 03/22/2013   Concentration deficit 04/27/2011   Attention or concentration deficit 04/27/2011   TINEA BARBAE 03/04/2009   LIVER HEMANGIOMA 03/04/2009   OVERWEIGHT-BMI 42 03/04/2009   OSA on CPAP 03/04/2009   DEGENERATIVE DISC DISEASE, LUMBAR SPINE 03/04/2009    ONSET DATE: 12-15-23  REFERRING DIAG: C71.9 (ICD-10-CM) - Malignant neoplasm of brain, unspecified  THERAPY DIAG:   Hemiplegia and hemiparesis following other nontraumatic intracranial hemorrhage affecting left dominant side (HCC)  Other abnormalities of gait and mobility  Muscle weakness (generalized)  Other lack of coordination  Unsteadiness on feet  Rationale for Evaluation and Treatment: Rehabilitation  SUBJECTIVE:                                                                                                                                                                                             SUBJECTIVE STATEMENT: Pt presents to OP PT for initial eval -  ambulating without device but with wife's HHA; pt reports he was able to walk2 days after surgery -used RW first day only.  Pt was admitted to [facility] hospital from ED on 12-15-23;  underwent craniotomy for brain tumor (with features of gliobastoma per chart note) on 12-18-23;  pt was transferred to inpatient rehab on 12-26-23 and discharged home with wife on 12-31-23.  Wife reports pt is a walking miracle - doctors did not anticipate the functional progress he has made.  Per chart note:  53 yo male presenting 12/20 to ED with decreased response time while driving, incoordination, imbalance. Imaging showed necrotic and hemorrhagic mass in the superior right frontal lobe with features of glioblastoma with mass effect. S/p R craniotomy 12/23. PMH includes: obesity and OSA.   Pt accompanied by:  wife  - [name]  PERTINENT HISTORY: history of OSA, HTN, Tinea barbae, obesity s/p gastric sleeve, liver hemangioma who was admitted on 12/15/23 with reports of decrease in coordination, minimal use of LUE/LLE and; hospital admission 12-15-23 with transfer to inpatient rehab 12-26-23 - 12-31-23  PAIN:  Are you having pain? No  PRECAUTIONS: Other: No driving; Fall  RED FLAGS: None   WEIGHT BEARING RESTRICTIONS: No  FALLS: Has patient fallen in last 6 months? No  LIVING ENVIRONMENT: Lives with: lives with their spouse Lives in: House/apartment Stairs:  Yes: Internal: 12 steps; can reach both master bedroom on main level  - has not been up steps to 2nd level of home yet as only been home for 2 days Has following  equipment at home: None  PLOF: Independent; pt retired from [employer] In 2022 - worked for [employer]  PATIENT GOALS: improve strength and balance; pt states he was in process of applying with [employer] police dept on 12-15-23 when onset occurred - would like to be able to take this position if he is able to perform required work activities safely  OBJECTIVE:  Note: Objective measures were completed at Evaluation unless otherwise noted.  DIAGNOSTIC FINDINGS: MRI brain done revealing necrotic, hemorrhagic mass in superior right frontal lobe with features of glioblastoma and mass effect with very early right uncal herniation.  COGNITION: Overall cognitive status: Within functional limits for tasks assessed and appears to have slightly delayed processing - see OT eval for details   SENSATION: WFL  COORDINATION: WFL's bil. LE's  POSTURE: No Significant postural limitations  LOWER EXTREMITY ROM:   WNL's bil. LE's   LOWER EXTREMITY MMT:  RLE WNL's  MMT Right Eval Left Eval  Hip flexion 5 4-  Hip extension 5 3-  Hip abduction  4  Hip adduction    Hip internal rotation    Hip external rotation    Knee flexion 5 4-  Knee extension 5 5  Ankle dorsiflexion 5 5  Ankle plantarflexion 5 5  Ankle inversion    Ankle eversion    (Blank rows = not tested)  BED MOBILITY:  Independent  TRANSFERS: Assistive device utilized: None  Sit to stand: Modified independence Stand to sit: Modified independence  RAMP: TBA  CURB: TBA  STAIRS: TBA   GAIT: Gait pattern: step through pattern and decreased arm swing- Left Distance walked: 100' Assistive device utilized: None Level of assistance: SBA Comments: mildly decreased Lt arm swing   FUNCTIONAL TESTS:  5 times sit to stand: 11.69 secs from chair  without UE support Timed up and go (TUG): 10.87 secs without device 10 meter walk test: 11.68 secs without device = 2.81 ft/sec Berg Balance Scale: 54/56     PATIENT SURVEYS:  N/A due to dx of brain tumor                                                                                                                              TREATMENT DATE: 01-02-24  Eval only  PATIENT EDUCATION: Education details: eval results and POC Person educated: Patient and wife Education method: Explanation Education comprehension: verbalized understanding  HOME EXERCISE PROGRAM: To be established  GOALS: Goals reviewed with patient? Yes  SHORT TERM GOALS: SAME AS LTG'S AS ELOS = 4 WEEKS     LONG TERM GOALS: Target date: 02-02-24  Assess FGA and set goal as appropriate. Baseline:  Goal status: INITIAL  2.  Improve Berg score to 56/56 to demo improved static standing balance. Baseline: 54/56 on 01-02-24 Goal status: INITIAL  3.  Pt will amb. 10 nonstop on various surfaces including grass, pavement and indoor, without LOB and with no c/o fatigue, demonstrating Lt  arm swing, with supervision.  Baseline:  Goal status: INITIAL  4.  Increase gait velocity to >/= 3.6 ft/sec without use of device for increased gait efficiency.  Baseline: 2.81 ft/sec (11.68 secs) Goal status: INITIAL  5.  Pt will jog 115' on flat, even surface independently without LOB and RPE </= 2/10.  Baseline:  Goal status: INITIAL  6.  Negotiate 4 steps using step over step sequence with supervision without use of hand rail to demo improved balance. Baseline:  Goal status: INITIAL  7.  Independent in HEP for LLE strengthening, balance and coordination.   Baseline:  Goal status: INITIAL  ASSESSMENT:  CLINICAL IMPRESSION: Patient is a 53 y.o. gentleman who was seen today for physical therapy evaluation and treatment for LLE weakness, gait and balance deficits due to cranial hemorrhage due to brain tumor on 12-15-23.   Pt presents with mildly decreased strength in LLE and high level balance and gait deficits.  Pt's TUG score of 10.87 secs is not indicative of fall risk, however, pt has decreased gait velocity of 2.81 ft/sec and mild gait deviation of decreased Lt arm swing during gait.  Pt will benefit from PT to address LLE weakness, gait and balance deficits and decreased endurance/activity tolerance.     OBJECTIVE IMPAIRMENTS: Abnormal gait, decreased activity tolerance, decreased balance, decreased coordination, and decreased strength.   ACTIVITY LIMITATIONS: carrying, lifting, squatting, stairs, and locomotion level  PARTICIPATION LIMITATIONS: cleaning, laundry, driving, shopping, community activity, and occupation  PERSONAL FACTORS: Profession and 1 comorbidity: s/p craniotomy due to brain tumor  are also affecting patient's functional outcome.   REHAB POTENTIAL: Good  CLINICAL DECISION MAKING: Evolving/moderate complexity  EVALUATION COMPLEXITY: Moderate  PLAN:  PT FREQUENCY: 3x/week  PT DURATION: 4 weeks  PLANNED INTERVENTIONS: 97110-Therapeutic exercises, 97530- Therapeutic activity, [id number]- Neuromuscular re-education, [phone]- Self Care, [id number]- Gait training, [phone]- Aquatic Therapy, Patient/Family education, Balance training, and Stair training  PLAN FOR NEXT SESSION: do FGA; issue HEP for LLE strengthening   Zira Helinski, [place], PT 01/03/2024, 11:22 AM

## 2024-01-02 NOTE — Telephone Encounter (Signed)
 Copied from CRM [id number]. Topic: General - Other >> Jan 02, 2024 12:22 PM Corin V wrote: Reason for CRM: Brittany, case production designer, theatre/television/film with patient health insurance, called to get her contact information to the clinic. Patient was recently in the hospital with a potential glioblastoma with a brain bleed and she is available for any case management needs for the patient during his follow ups.

## 2024-01-02 NOTE — Therapy (Deleted)
 OUTPATIENT OCCUPATIONAL THERAPY NEURO EVALUATION  Patient Name: Nicholas Stevens MRN: [id number] DOB:[birth year], 53 y.o., male Today's Date: 01/02/2024  PCP: Garald Karlynn GAILS, MD  REFERRING PROVIDER: Pegge Toribio PARAS, PA-C   END OF SESSION:   Past Medical History:  Diagnosis Date   DDD (degenerative disc disease), lumbar    HNP (herniated nucleus pulposus)    Hypertension    Liver hemangioma    Morbid obesity (HCC)    OSA (obstructive sleep apnea)    no cpap used since weight loss   Overweight(278.02)    Plantar fasciitis of right foot    Sleep apnea    Tinea barbae    Past Surgical History:  Procedure Laterality Date   BREATH TEK H PYLORI N/A 08/20/2013   Procedure: BREATH TEK VEAR [name];  Surgeon: Donnice KATHEE Lunger, MD;  Location: [facility];  Service: General;  Laterality: N/A;   COLONOSCOPY WITH PROPOFOL  N/A 05/05/2022   Procedure: COLONOSCOPY WITH PROPOFOL ;  Surgeon: Albertus Gordy HERO, MD;  Location: [facility];  Service: Gastroenterology;  Laterality: N/A;   CRANIOTOMY Right 12/18/2023   Procedure: Right Frontal Stereotactic Craniotomy for Resection of Tumor;  Surgeon: Debby Dorn MATSU, MD;  Location[facility];  Service: Neurosurgery;  Laterality: Right;   ESOPHAGOGASTRODUODENOSCOPY (EGD) WITH PROPOFOL  N/A 03/15/2023   Procedure: ESOPHAGOGASTRODUODENOSCOPY (EGD) WITH PROPOFOL ;  Surgeon: Avram Lupita BRAVO, MD;  Location: [facility];  Service: Gastroenterology;  Laterality: N/A;   LAPAROSCOPIC APPENDECTOMY  2007   Dr Curvin   LAPAROSCOPIC GASTRIC SLEEVE RESECTION N/A 10/08/2013   Procedure: LAPAROSCOPIC SLEEVE GASTRECTOMY;  Surgeon: Donnice KATHEE Lunger, MD;  Location: [facility];  Service: General;  Laterality: N/A;   LUMBAR LAMINECTOMY/DECOMPRESSION MICRODISCECTOMY N/A 09/16/2015   Procedure: MICRO LUMBAR DECOMPRESSION, MICRODISCECTOMY L5 - S1;  Surgeon: Reyes Billing, MD;  Location: [facility];  Service: Orthopedics;  Laterality: N/A;   POLYPECTOMY  05/05/2022   Procedure:  POLYPECTOMY;  Surgeon: Albertus Gordy HERO, MD;  Location: [facility];  Service: Gastroenterology;;   ROTATOR CUFF REPAIR Right 2005   Dr Harden   Patient Active Problem List   Diagnosis Date Noted   Allergic drug rash 12/29/2023   DVT, lower extremity, distal (HCC) 12/29/2023   Glioma of brain (HCC) 12/26/2023   Brain mass 12/15/2023   Pseudofolliculitis barbae 05/01/2023   Iron deficiency 04/06/2023   Abdominal pain 03/15/2023   Microcytosis 03/14/2023   RUQ pain 03/12/2023   Abnormal EKG 03/12/2023   Chest pain 03/12/2023   Adult acne 01/19/2023   Obesity (BMI 30-39.9) 01/19/2023   Benign neoplasm of cecum    Benign neoplasm of ascending colon    Benign neoplasm of transverse colon    Difficult airway for intubation 01/18/2022   Upper respiratory disease 01/31/2019   Cerumen impaction 07/30/2018   HTN (hypertension) 11/03/2017   Spinal stenosis of lumbar region 09/16/2015   Insomnia 04/15/2015   Well adult exam 11/04/2014   H/O gastric sleeve 10/08/2013   Hypogonadism, male 07/17/2013   Erectile dysfunction 03/22/2013   Concentration deficit 04/27/2011   Attention or concentration deficit 04/27/2011   TINEA BARBAE 03/04/2009   LIVER HEMANGIOMA 03/04/2009   OVERWEIGHT-BMI 42 03/04/2009   OSA on CPAP 03/04/2009   DEGENERATIVE DISC DISEASE, LUMBAR SPINE 03/04/2009    ONSET DATE: 12/27/22 (referral date)  REFERRING DIAG: C71.9 (ICD-10-CM) - Malignant neoplasm of brain, unspecified   THERAPY DIAG:  No diagnosis found.  Rationale for Evaluation and Treatment: Rehabilitation  SUBJECTIVE:   SUBJECTIVE STATEMENT: *** Pt accompanied by: {accompnied:27141}  PERTINENT HISTORY: Per 12/27/23 acute OT evaluation: history of OSA, HTN, obesity s/p gastric sleeve, liver hemangioma who was admitted on 12/15/23 with reports of decrease in coordination, minimal use of LUE/LLE and fall prompting treatment. MRI brain done revealing necrotic, hemorrhagic mass in superior right frontal lobe  with features of glioblastoma and mass effect with very early right uncal herniation.  He underwent right frontal stereotactic  craniotomy for resection of frontal lobe tumor on 12/23 by Dr. Debby.  Post op has had improvement in LUE strength and follow up MRI showed good debulking of tumor with decrease in mass effect, collapse of lesion with region of enhancement  and 5 mm right to left shift. Pathology revealed high grade glioma grade 4 c/w glioblastoma.   Per 12/31/23 D/C Summary: Head CT 12/15/2023. MRI brain 12/19/2023. IMPRESSION: Postoperative changes of right frontal craniotomy for resection of a mass centered within the right superior frontal gyrus. Expected small amount of blood products in the resection cavity. Similar extent of surrounding vasogenic edema with partial effacement of the right lateral ventricle.   PRECAUTIONS: {Therapy precautions:24002}  WEIGHT BEARING RESTRICTIONS: {Yes ***/No:24003}  PAIN:  Are you having pain? {OPRCPAIN:27236}  FALLS: Has patient fallen in last 6 months? {fallsyesno:27318}  LIVING ENVIRONMENT: Lives with: {OPRC lives with:25569::lives with their family} Lives in: {Lives in:25570} Stairs: {opstairs:27293} Has following equipment at home: {Assistive devices:23999}  PLOF: {PLOF:24004}  PATIENT GOALS: ***  OBJECTIVE:  Note: Objective measures were completed at Evaluation unless otherwise noted.  HAND DOMINANCE: {MISC; OT HAND DOMINANCE:[phone]}  ADLs: Overall ADLs: *** Transfers/ambulation related to ADLs: Eating: *** Grooming: *** UB Dressing: *** LB Dressing: *** Toileting: *** Bathing: *** Tub Shower transfers: *** Equipment: {equipment:25573}  IADLs: Shopping: *** Light housekeeping: *** Meal Prep: *** Community mobility: *** Medication management: *** Financial management: *** Handwriting: {OTWRITTENEXPRESSION:25361}  MOBILITY STATUS: {OTMOBILITY:25360}  POSTURE COMMENTS:  {posture:25561} Sitting balance:  {sitting balance:25483}  ACTIVITY TOLERANCE: Activity tolerance: ***  FUNCTIONAL OUTCOME MEASURES: {OTFUNCTIONALMEASURES:27238}  UPPER EXTREMITY ROM:    {AROM/PROM:27142} ROM Right eval Left eval  Shoulder flexion    Shoulder abduction    Shoulder adduction    Shoulder extension    Shoulder internal rotation    Shoulder external rotation    Elbow flexion    Elbow extension    Wrist flexion    Wrist extension    Wrist ulnar deviation    Wrist radial deviation    Wrist pronation    Wrist supination    (Blank rows = not tested)  UPPER EXTREMITY MMT:     MMT Right eval Left eval  Shoulder flexion    Shoulder abduction    Shoulder adduction    Shoulder extension    Shoulder internal rotation    Shoulder external rotation    Middle trapezius    Lower trapezius    Elbow flexion    Elbow extension    Wrist flexion    Wrist extension    Wrist ulnar deviation    Wrist radial deviation    Wrist pronation    Wrist supination    (Blank rows = not tested)  HAND FUNCTION: {handfunction:27230}  COORDINATION: {otcoordination:27237}  SENSATION: {sensation:27233}  EDEMA: ***  MUSCLE TONE: {UETONE:25567}  COGNITION: Overall cognitive status: {cognition:24006}  VISION: Subjective report: *** Baseline vision: {OTBASELINEVISION:25363} Visual history: {OTVISUALHISTORY:25364}  VISION ASSESSMENT: {visionassessment:27231}  Patient has difficulty with following activities due to following visual impairments: ***  PERCEPTION: {Perception:25564}  PRAXIS: {Praxis:25565}  OBSERVATIONS: ***  TREATMENT DATE: ***         PATIENT EDUCATION: Education details: *** Person educated: {Person educated:25204} Education method: {Education Method:25205} Education comprehension: {Education Comprehension:25206}  HOME EXERCISE  PROGRAM: ***   GOALS: Goals reviewed with patient? {yes/no:20286}  SHORT TERM GOALS: Target date: ***  *** Baseline: Goal status: INITIAL  2.  *** Baseline:  Goal status: INITIAL  3.  *** Baseline:  Goal status: INITIAL  4.  *** Baseline:  Goal status: INITIAL  5.  *** Baseline:  Goal status: INITIAL  6.  *** Baseline:  Goal status: INITIAL  LONG TERM GOALS: Target date: ***  *** Baseline:  Goal status: INITIAL  2.  *** Baseline:  Goal status: INITIAL  3.  *** Baseline:  Goal status: INITIAL  4.  *** Baseline:  Goal status: INITIAL  5.  *** Baseline:  Goal status: INITIAL  6.  *** Baseline:  Goal status: INITIAL  ASSESSMENT:  CLINICAL IMPRESSION: Patient is a *** y.o. *** who was seen today for occupational therapy evaluation for ***.   PERFORMANCE DEFICITS: in functional skills including {OT physical skills:25468}, cognitive skills including {OT cognitive skills:25469}, and psychosocial skills including {OT psychosocial skills:25470}.   IMPAIRMENTS: are limiting patient from {OT performance deficits:25471}.   CO-MORBIDITIES: {Comorbidities:25485} that affects occupational performance. Patient will benefit from skilled OT to address above impairments and improve overall function.  MODIFICATION OR ASSISTANCE TO COMPLETE EVALUATION: {OT modification:25474}  OT OCCUPATIONAL PROFILE AND HISTORY: {OT PROFILE AND HISTORY:25484}  CLINICAL DECISION MAKING: {OT CDM:25475}  REHAB POTENTIAL: {rehabpotential:25112}  EVALUATION COMPLEXITY: {Evaluation complexity:25115}    PLAN:  OT FREQUENCY: {rehab frequency:25116}  OT DURATION: {rehab duration:25117}  PLANNED INTERVENTIONS: {OT Interventions:25467}  RECOMMENDED OTHER SERVICES: ***  CONSULTED AND AGREED WITH PLAN OF CARE: {ENR:74513}  PLAN FOR NEXT SESSION: ***   Geofm FORBES Coder, OT 01/02/2024, 7:58 AM

## 2024-01-03 ENCOUNTER — Encounter: Payer: Self-pay | Admitting: Physical Therapy

## 2024-01-03 ENCOUNTER — Telehealth: Payer: Self-pay | Admitting: Internal Medicine

## 2024-01-03 NOTE — Telephone Encounter (Signed)
 Scheduled appointment per 1/7 secure chat. Talked with the patients wife Mrs.Villeda who was given appointment information. Mrs.Kloss is aware of the made appointments for Mr.Duesing.

## 2024-01-03 NOTE — Telephone Encounter (Signed)
 Noted! Thank you

## 2024-01-04 ENCOUNTER — Encounter: Payer: Self-pay | Admitting: Physical Therapy

## 2024-01-04 ENCOUNTER — Telehealth: Payer: Self-pay | Admitting: Radiation Oncology

## 2024-01-04 ENCOUNTER — Ambulatory Visit: Payer: Commercial Managed Care - PPO | Admitting: Physical Therapy

## 2024-01-04 DIAGNOSIS — R2681 Unsteadiness on feet: Secondary | ICD-10-CM

## 2024-01-04 DIAGNOSIS — R2689 Other abnormalities of gait and mobility: Secondary | ICD-10-CM

## 2024-01-04 DIAGNOSIS — I69252 Hemiplegia and hemiparesis following other nontraumatic intracranial hemorrhage affecting left dominant side: Secondary | ICD-10-CM | POA: Diagnosis not present

## 2024-01-04 DIAGNOSIS — M6281 Muscle weakness (generalized): Secondary | ICD-10-CM

## 2024-01-04 NOTE — Telephone Encounter (Signed)
 Called patient to schedule a consultation w. Dr. Mitzi Hansen. No answer, LVM for a return call.

## 2024-01-04 NOTE — Therapy (Signed)
 OUTPATIENT PHYSICAL THERAPY NEURO TREATMENT NOTE   Patient Name: Nicholas Stevens MRN: [id number] DOB:10-16-1971, 53 y.o., male Today's Date: 01/04/2024   PCP: Loman Karlynn GAILS., MD REFERRING PROVIDER: Pegge Toribio PARAS, PA-C  END OF SESSION:  PT End of Session - 01/04/24 1338     Visit Number 2    Number of Visits 13    Date for PT Re-Evaluation 02/02/24   pushed out 1 week due to scheduling   Authorization Type [facility]    Authorization Time Period 01-02-24 - 03-01-24    PT Start Time 0935    PT Stop Time 1018    PT Time Calculation (min) 43 min    Equipment Utilized During Treatment Gait belt    Activity Tolerance Patient tolerated treatment well    Behavior During Therapy WFL for tasks assessed/performed              Past Medical History:  Diagnosis Date   DDD (degenerative disc disease), lumbar    HNP (herniated nucleus pulposus)    Hypertension    Liver hemangioma    Morbid obesity (HCC)    OSA (obstructive sleep apnea)    no cpap used since weight loss   Overweight(278.02)    Plantar fasciitis of right foot    Sleep apnea    Tinea barbae    Past Surgical History:  Procedure Laterality Date   BREATH TEK H PYLORI N/A 08/20/2013   Procedure: BREATH TEK VEAR [name];  Surgeon: Donnice KATHEE Lunger, MD;  Location: [facility];  Service: General;  Laterality: N/A;   COLONOSCOPY WITH PROPOFOL  N/A 05/05/2022   Procedure: COLONOSCOPY WITH PROPOFOL ;  Surgeon: Albertus Gordy HERO, MD;  Location: [facility];  Service: Gastroenterology;  Laterality: N/A;   CRANIOTOMY Right 12/18/2023   Procedure: Right Frontal Stereotactic Craniotomy for Resection of Tumor;  Surgeon: Debby Dorn MATSU, MD;  Location[facility];  Service: Neurosurgery;  Laterality: Right;   ESOPHAGOGASTRODUODENOSCOPY (EGD) WITH PROPOFOL  N/A 03/15/2023   Procedure: ESOPHAGOGASTRODUODENOSCOPY (EGD) WITH PROPOFOL ;  Surgeon: Avram Lupita BRAVO, MD;  Location: [facility];  Service: Gastroenterology;  Laterality: N/A;    LAPAROSCOPIC APPENDECTOMY  2007   Dr Curvin   LAPAROSCOPIC GASTRIC SLEEVE RESECTION N/A 10/08/2013   Procedure: LAPAROSCOPIC SLEEVE GASTRECTOMY;  Surgeon: Donnice KATHEE Lunger, MD;  Location: [facility];  Service: General;  Laterality: N/A;   LUMBAR LAMINECTOMY/DECOMPRESSION MICRODISCECTOMY N/A 09/16/2015   Procedure: MICRO LUMBAR DECOMPRESSION, MICRODISCECTOMY L5 - S1;  Surgeon: Reyes Billing, MD;  Location: [facility];  Service: Orthopedics;  Laterality: N/A;   POLYPECTOMY  05/05/2022   Procedure: POLYPECTOMY;  Surgeon: Albertus Gordy HERO, MD;  Location: [facility];  Service: Gastroenterology;;   ROTATOR CUFF REPAIR Right 2005   Dr Harden   Patient Active Problem List   Diagnosis Date Noted   Allergic drug rash 12/29/2023   DVT, lower extremity, distal (HCC) 12/29/2023   Glioma of brain (HCC) 12/26/2023   Brain mass 12/15/2023   Pseudofolliculitis barbae 05/01/2023   Iron deficiency 04/06/2023   Abdominal pain 03/15/2023   Microcytosis 03/14/2023   RUQ pain 03/12/2023   Abnormal EKG 03/12/2023   Chest pain 03/12/2023   Adult acne 01/19/2023   Obesity (BMI 30-39.9) 01/19/2023   Benign neoplasm of cecum    Benign neoplasm of ascending colon    Benign neoplasm of transverse colon    Difficult airway for intubation 01/18/2022   Upper respiratory disease 01/31/2019   Cerumen impaction 07/30/2018   HTN (hypertension) 11/03/2017   Spinal stenosis of  lumbar region 09/16/2015   Insomnia 04/15/2015   Well adult exam 11/04/2014   H/O gastric sleeve 10/08/2013   Hypogonadism, male 07/17/2013   Erectile dysfunction 03/22/2013   Concentration deficit 04/27/2011   Attention or concentration deficit 04/27/2011   TINEA BARBAE 03/04/2009   LIVER HEMANGIOMA 03/04/2009   OVERWEIGHT-BMI 42 03/04/2009   OSA on CPAP 03/04/2009   DEGENERATIVE DISC DISEASE, LUMBAR SPINE 03/04/2009    ONSET DATE: 12-15-23  REFERRING DIAG: C71.9 (ICD-10-CM) - Malignant neoplasm of brain, unspecified  THERAPY DIAG:  Muscle  weakness (generalized)  Unsteadiness on feet  Other abnormalities of gait and mobility  Rationale for Evaluation and Treatment: Rehabilitation  SUBJECTIVE:                                                                                                                                                                                             SUBJECTIVE STATEMENT: Pt reports he walked yesterday with his wife for 20 in their cul-de-sac; went shopping for a recliner on Tuesday after therapy appts - was a little tired at the end of that day.  Did well walking yesterday (on pavement)  Per chart note:  53 yo male presenting 12/20 to ED with decreased response time while driving, incoordination, imbalance. Imaging showed necrotic and hemorrhagic mass in the superior right frontal lobe with features of glioblastoma with mass effect. S/p R craniotomy 12/23. PMH includes: obesity and OSA.   Pt accompanied by:  wife  - [name]  PERTINENT HISTORY: history of OSA, HTN, Tinea barbae, obesity s/p gastric sleeve, liver hemangioma who was admitted on 12/15/23 with reports of decrease in coordination, minimal use of LUE/LLE and; hospital admission 12-15-23 with transfer to inpatient rehab 12-26-23 - 12-31-23  PAIN:  Are you having pain? No  PRECAUTIONS: Other: No driving; Fall  RED FLAGS: None   WEIGHT BEARING RESTRICTIONS: No  FALLS: Has patient fallen in last 6 months? No  LIVING ENVIRONMENT: Lives with: lives with their spouse Lives in: House/apartment Stairs: Yes: Internal: 12 steps; can reach both master bedroom on main level  - has not been up steps to 2nd level of home yet as only been home for 2 days Has following equipment at home: None  PLOF: Independent; pt retired from [employer] In 2022 - worked for [employer]  PATIENT GOALS: improve strength and balance; pt states he was in process of applying with [place] [employer] de[employer] on 12-15-23 when onset occurred - would like to  be able to take this position if he is able to perform required work activities safely  OBJECTIVE:  Note: Objective measures were  completed at Evaluation unless otherwise noted.  DIAGNOSTIC FINDINGS: MRI brain done revealing necrotic, hemorrhagic mass in superior right frontal lobe with features of glioblastoma and mass effect with very early right uncal herniation.  COGNITION: Overall cognitive status: Within functional limits for tasks assessed and appears to have slightly delayed processing - see OT eval for details   SENSATION: WFL  COORDINATION: WFL's bil. LE's  POSTURE: No Significant postural limitations  LOWER EXTREMITY ROM:   WNL's bil. LE's   LOWER EXTREMITY MMT:  RLE WNL's  MMT Right Eval Left Eval  Hip flexion 5 4-  Hip extension 5 3-  Hip abduction  4  Hip adduction    Hip internal rotation    Hip external rotation    Knee flexion 5 4-  Knee extension 5 5  Ankle dorsiflexion 5 5  Ankle plantarflexion 5 5  Ankle inversion    Ankle eversion    (Blank rows = not tested)  BED MOBILITY:  Independent  TRANSFERS: Assistive device utilized: None  Sit to stand: Modified independence Stand to sit: Modified independence  RAMP: TBA  CURB: TBA  STAIRS: TBA   GAIT: Gait pattern: step through pattern and decreased arm swing- Left Distance walked: 100' Assistive device utilized: None Level of assistance: SBA Comments: mildly decreased Lt arm swing   FUNCTIONAL TESTS:  5 times sit to stand: 11.69 secs from chair without UE support Timed up and go (TUG): 10.87 secs without device 10 meter walk test: 11.68 secs without device = 2.81 ft/sec Berg Balance Scale: 54/56     PATIENT SURVEYS:  N/A due to dx of brain tumor                                                                                                                              TREATMENT DATE: 01-04-24   Gait:  FGA   [facility] PT Assessment - 01/04/24 0001       Functional Gait   Assessment   Gait Level Surface Walks 20 ft in less than 5.5 sec, no assistive devices, good speed, no evidence for imbalance, normal gait pattern, deviates no more than 6 in outside of the 12 in walkway width.    Change in Gait Speed Able to smoothly change walking speed without loss of balance or gait deviation. Deviate no more than 6 in outside of the 12 in walkway width.    Gait with Horizontal Head Turns Performs head turns smoothly with no change in gait. Deviates no more than 6 in outside 12 in walkway width    Gait with Vertical Head Turns Performs head turns with no change in gait. Deviates no more than 6 in outside 12 in walkway width.    Gait and Pivot Turn Pivot turns safely within 3 sec and stops quickly with no loss of balance.    Step Over Obstacle Is able to step over 2 stacked shoe boxes taped together (9 in total height)  without changing gait speed. No evidence of imbalance.    Gait with Narrow Base of Support Ambulates 4-7 steps.    Gait with Eyes Closed Walks 20 ft, no assistive devices, good speed, no evidence of imbalance, normal gait pattern, deviates no more than 6 in outside 12 in walkway width. Ambulates 20 ft in less than 7 sec.    Ambulating Backwards Walks 20 ft, no assistive devices, good speed, no evidence for imbalance, normal gait    Steps Alternating feet, no rail.    Total Score 28             TherEx:  instructed in [employer] HEP for LLE strengthening:     6 walk test - pt amb. 1277.6' without device - for endurance training   Access Code: [email or web address] URL: [email or web address] Date: 01/04/2024 Prepared by: Rock Kussmaul  Exercises - 2# weight used for SLR, knee flexion and clam shell and hip abduction  - Supine Active Straight Leg Raise  - 1 x daily - 7 x weekly - 3 sets - 10 reps - SIDELYING LEG LIFTS; ALSO DO CIRCLES CLOCKWISE & COUNTERCLOCKWISE  - 1 x daily - 7 x weekly - 3 sets - 10 reps - Prone Hip Extension  - 1 x daily - 7 x weekly  - 3 sets - 10 reps - Clamshell  - 1 x daily - 7 x weekly - 3 sets - 10 reps - 5 sec hold - Prone Knee Flexion AROM  - 1 x daily - 7 x weekly - 3 sets - 10 reps - Single Leg Stance  - 1 x daily - 7 x weekly - 1 sets - 2-3 reps - 15 sec hold - Tandem Stance  - 1 x daily - 7 x weekly - 1 sets - 2 reps - 30 sec  hold  NeuroRe-ed: Instructed in SLS and tandem stance for HEP; instructed to add head turns to tandem stance if just standing in tandem is not challenging  PATIENT EDUCATION: Education details: eval results and POC Person educated: Patient and wife Education method: Explanation Education comprehension: verbalized understanding  HOME EXERCISE PROGRAM: To be established  GOALS: Goals reviewed with patient? Yes  SHORT TERM GOALS: SAME AS LTG'S AS ELOS = 4 WEEKS     LONG TERM GOALS: Target date: 02-02-24  Assess FGA and set goal as appropriate. Baseline:  Goal status: INITIAL  2.  Improve Berg score to 56/56 to demo improved static standing balance. Baseline: 54/56 on 01-02-24 Goal status: INITIAL  3.  Pt will amb. 10 nonstop on various surfaces including grass, pavement and indoor, without LOB and with no c/o fatigue, demonstrating Lt arm swing, with supervision.  Baseline:  Goal status: INITIAL  4.  Increase gait velocity to >/= 3.6 ft/sec without use of device for increased gait efficiency.  Baseline: 2.81 ft/sec (11.68 secs) Goal status: INITIAL  5.  Pt will jog 115' on flat, even surface independently without LOB and RPE </= 2/10.  Baseline:  Goal status: INITIAL  6.  Negotiate 4 steps using step over step sequence with supervision without use of hand rail to demo improved balance. Baseline:  Goal status: INITIAL  7.  Independent in HEP for LLE strengthening, balance and coordination.   Baseline:  Goal status: INITIAL  ASSESSMENT:  CLINICAL IMPRESSION: PT session focused on assessment of FGA and 6 walk test for endurance/activity tolerance assessment.   Pt's FGA score = 28/30 with pt having difficulty performing tandem stance.  Pt  amb. 1277.6' in 6 walk test without device with near normal Lt arm swing noted; pt did amb. Very closely to objects on his Lt side but did not bump anything on his left.  Remainder of session focused on establishing HEP for LLE strengthening with 2# weight used with some PRE's.  Pt was challenged with performing exercises 10 reps each (decreased muscle endurance noted).  Cont with POC.   OBJECTIVE IMPAIRMENTS: Abnormal gait, decreased activity tolerance, decreased balance, decreased coordination, and decreased strength.   ACTIVITY LIMITATIONS: carrying, lifting, squatting, stairs, and locomotion level  PARTICIPATION LIMITATIONS: cleaning, laundry, driving, shopping, community activity, and occupation  PERSONAL FACTORS: Profession and 1 comorbidity: s/p craniotomy due to brain tumor  are also affecting patient's functional outcome.   REHAB POTENTIAL: Good  CLINICAL DECISION MAKING: Evolving/moderate complexity  EVALUATION COMPLEXITY: Moderate  PLAN:  PT FREQUENCY: 3x/week  PT DURATION: 4 weeks  PLANNED INTERVENTIONS: 97110-Therapeutic exercises, 97530- Therapeutic activity, [id number]- Neuromuscular re-education, [phone]- Self Care, [id number]- Gait training, [phone]- Aquatic Therapy, Patient/Family education, Balance training, and Stair training  PLAN FOR NEXT SESSION: check HEP for any ?'s:  do SOT   Aleila Syverson, [place], PT 01/04/2024, 1:41 PM

## 2024-01-05 ENCOUNTER — Telehealth: Payer: Self-pay | Admitting: Radiation Oncology

## 2024-01-05 ENCOUNTER — Ambulatory Visit: Payer: Commercial Managed Care - PPO | Admitting: Occupational Therapy

## 2024-01-05 NOTE — Telephone Encounter (Signed)
 Called patient to schedule a consultation w. Dr. Mitzi Hansen. No answer, LVM for a return call.

## 2024-01-08 ENCOUNTER — Ambulatory Visit: Payer: Commercial Managed Care - PPO | Admitting: Physical Therapy

## 2024-01-08 ENCOUNTER — Telehealth: Payer: Self-pay | Admitting: Radiation Oncology

## 2024-01-08 ENCOUNTER — Inpatient Hospital Stay (HOSPITAL_BASED_OUTPATIENT_CLINIC_OR_DEPARTMENT_OTHER): Payer: Commercial Managed Care - PPO | Admitting: Internal Medicine

## 2024-01-08 VITALS — BP 138/88 | HR 81

## 2024-01-08 VITALS — BP 146/84 | HR 75 | Temp 98.2°F | Resp 14 | Wt 270.4 lb

## 2024-01-08 DIAGNOSIS — Z510 Encounter for antineoplastic radiation therapy: Secondary | ICD-10-CM | POA: Insufficient documentation

## 2024-01-08 DIAGNOSIS — Z79899 Other long term (current) drug therapy: Secondary | ICD-10-CM | POA: Insufficient documentation

## 2024-01-08 DIAGNOSIS — R2681 Unsteadiness on feet: Secondary | ICD-10-CM

## 2024-01-08 DIAGNOSIS — R2689 Other abnormalities of gait and mobility: Secondary | ICD-10-CM

## 2024-01-08 DIAGNOSIS — C711 Malignant neoplasm of frontal lobe: Secondary | ICD-10-CM | POA: Insufficient documentation

## 2024-01-08 DIAGNOSIS — M6281 Muscle weakness (generalized): Secondary | ICD-10-CM

## 2024-01-08 DIAGNOSIS — I69252 Hemiplegia and hemiparesis following other nontraumatic intracranial hemorrhage affecting left dominant side: Secondary | ICD-10-CM | POA: Diagnosis not present

## 2024-01-08 NOTE — Progress Notes (Addendum)
Radiation Oncology         [phone] ________________________________  Name: Nicholas Stevens        MRN: [id number]  Date of Service: 01/11/2024 DOB: 08/24/1971  [id number], Nicholas Quint, MD  Nicholas Cao Georgeanna Lea, MD     REFERRING PHYSICIAN: Henreitta Leber, MD   DIAGNOSIS: The encounter diagnosis was Frontal glioblastoma [facility]).   HISTORY OF PRESENT ILLNESS: Nicholas Stevens is a 53 y.o. male seen at the request of Dr. Barbaraann Cao for a newly diagnosed glioblastoma of the right frontal lobe. The patient presented on 12/15/2023 with symptoms of slow decision making and difficulty in navigating his home.  He complained of a right-sided headache, and CT imaging with stroke protocol on 12/15/2023 showed a hemorrhagic area in the right frontal lobe measuring 2.7 cm.  An MRI brain on 12/15/2023 showed necrotic and hemorrhagic changes and a mass in the superior right frontal lobe with associated mass effect causing early right uncal herniation.  The mass measured up to 4.5 cm, he underwent urgent right frontal craniotomy on 12/18/2023 and final pathology showed focal high-grade glioma with marked vascular per liberation and extensive necrosis by biopsy and resection showed high-grade histologic findings compatible with glioblastoma.  The specimen measured 3.6 cm for the resection, and postoperative imaging on 12/19/2023 showed good debulking with collapse of the lesion with regional enhancement around the margin measuring 3.2 cm in greatest dimension cephalocaudal.  Mass effect upon the right lateral ventricle along the frontal horn was noted.  The patient was discharged on 12/26/2023.  He met with Dr. Barbaraann Cao and has been continuing neurophysical therapy.  He is seen today to discuss treatment recommendations of chemoradiation.    PREVIOUS RADIATION THERAPY: No   PAST MEDICAL HISTORY:  Past Medical History:  Diagnosis Date   DDD (degenerative disc disease), lumbar    HNP (herniated nucleus  pulposus)    Hypertension    Liver hemangioma    Morbid obesity (HCC)    OSA (obstructive sleep apnea)    no cpap used since weight loss   Overweight(278.02)    Plantar fasciitis of right foot    Sleep apnea    Tinea barbae        PAST SURGICAL HISTORY: Past Surgical History:  Procedure Laterality Date   BREATH TEK H PYLORI N/A 08/20/2013   Procedure: BREATH TEK H PYLORI;  Surgeon: Valarie Merino, MD;  Location: [facility];  Service: General;  Laterality: N/A;   COLONOSCOPY WITH PROPOFOL N/A 05/05/2022   Procedure: COLONOSCOPY WITH PROPOFOL;  Surgeon: Beverley Fiedler, MD;  Location: [facility];  Service: Gastroenterology;  Laterality: N/A;   CRANIOTOMY Right 12/18/2023   Procedure: Right Frontal Stereotactic Craniotomy for Resection of Tumor;  Surgeon: Bedelia Person, MD;  Location: [facility] OR;  Service: Neurosurgery;  Laterality: Right;   ESOPHAGOGASTRODUODENOSCOPY (EGD) WITH PROPOFOL N/A 03/15/2023   Procedure: ESOPHAGOGASTRODUODENOSCOPY (EGD) WITH PROPOFOL;  Surgeon: Iva Boop, MD;  Location: [facility];  Service: Gastroenterology;  Laterality: N/A;   LAPAROSCOPIC APPENDECTOMY  2007   Dr Carolynne Edouard   LAPAROSCOPIC GASTRIC SLEEVE RESECTION N/A 10/08/2013   Procedure: LAPAROSCOPIC SLEEVE GASTRECTOMY;  Surgeon: Valarie Merino, MD;  Location: [facility];  Service: General;  Laterality: N/A;   LUMBAR LAMINECTOMY/DECOMPRESSION MICRODISCECTOMY N/A 09/16/2015   Procedure: MICRO LUMBAR DECOMPRESSION, MICRODISCECTOMY L5 - S1;  Surgeon: Jene Every, MD;  Location: [facility];  Service: Orthopedics;  Laterality: N/A;   POLYPECTOMY  05/05/2022   Procedure: POLYPECTOMY;  Surgeon: Erick Blinks  M, MD;  Location: [facility];  Service: Gastroenterology;;   ROTATOR CUFF REPAIR Right 2005   Dr Lajoyce Corners     FAMILY HISTORY:  Family History  Problem Relation Age of Onset   Other Mother        hx of DJD   Liver disease Father        ETOH, Hep C   Hypertension Father    Colon cancer Neg Hx    Colon  polyps Neg Hx    Esophageal cancer Neg Hx    Rectal cancer Neg Hx    Stomach cancer Neg Hx      SOCIAL HISTORY:  reports that he has never smoked. He has never been exposed to tobacco smoke. He has never used smokeless tobacco. He reports that he does not drink alcohol and does not use drugs.  The patient is married and lives in [place]. He is accompanied by his wife. 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    ALLERGIES: Patient has no known allergies.   MEDICATIONS:  Current Outpatient Medications  Medication Sig Dispense Refill   apixaban (ELIQUIS) 5 MG TABS tablet Take 1 tablet (5 mg total) by mouth 2 (two) times daily. 60 tablet 0   camphor-menthol (SARNA) lotion Apply topically as needed for itching. 222 mL 0   Cyanocobalamin (B-12 PO) Take 1 tablet by mouth daily.     fexofenadine (ALLEGRA) 60 MG tablet Take 1 tablet (60 mg total) by mouth 2 (two) times daily. 60 tablet 0   latanoprost (XALATAN) 0.005 % ophthalmic solution Place 1 drop into both eyes at bedtime.     LORazepam (ATIVAN) 1 MG tablet Take 0.5 tablets (0.5 mg total) by mouth every 8 (eight) hours. 45 tablet 0   Multiple Vitamin (MULTIVITAMIN WITH MINERALS) TABS tablet Take 1 tablet by mouth daily.     Omega-3 Fatty Acids (FISH OIL) 1000 MG CAPS Take 1,000 mg by mouth daily.     pantoprazole (PROTONIX) 40 MG tablet Take 1 tablet (40 mg total) by mouth daily. 30 tablet 0   pimecrolimus (ELIDEL) 1 % cream Apply 1 Application topically 2 (two) times daily as needed (Dermatitis).     senna-docusate (SENOKOT-S) 8.6-50 MG tablet Take 2 tablets by mouth daily at 6 (six) AM. 60 tablet 0   zolpidem (AMBIEN) 10 MG tablet TAKE 1 TABLET BY MOUTH EVERYDAY AT BEDTIME 90 tablet 1   levETIRAcetam (KEPPRA) 500 MG tablet Take 1 tablet (500 mg total) by mouth 2 (two) times daily. (Patient not taking: Reported on 01/11/2024) 60 tablet 0   No current  facility-administered medications for this encounter.     REVIEW OF SYSTEMS: On review of systems, the patient reports that he is doing well and feels like his strength and awareness has returned rather quickly, sooner than he initially expected. No other complaints are verbalized.       PHYSICAL EXAM:  Wt Readings from Last 3 Encounters:  01/11/24 270 lb 3.2 oz (122.6 kg)  01/08/24 270 lb 6.4 oz (122.7 kg)  12/26/23 266 lb 8.6 oz (120.9 kg)   Temp Readings from Last 3 Encounters:  01/11/24 97.8 F (36.6 C)  01/08/24 98.2 F (36.8 C) (Temporal)  12/31/23 98.7 F (37.1 C) (Oral)   BP Readings from Last 3 Encounters:  01/11/24 (!) 162/99  01/11/24 138/87  01/08/24 (!) 146/84   Pulse Readings from Last 3 Encounters:  01/11/24 85  01/11/24 72  01/08/24 75   Pain Assessment Pain Score: 0-No pain/10  In general this is a well appearing African American male in no acute distress. He's alert and oriented x4 and appropriate throughout the examination. Cardiopulmonary assessment is negative for acute distress and he exhibits normal effort.     ECOG = 1  0 - Asymptomatic (Fully active, able to carry on all predisease activities without restriction)  1 - Symptomatic but completely ambulatory (Restricted in physically strenuous activity but ambulatory and able to carry out work of a light or sedentary nature. For example, light housework, office work)  2 - Symptomatic, <50% in bed during the day (Ambulatory and capable of all self care but unable to carry out any work activities. Up and about more than 50% of waking hours)  3 - Symptomatic, >50% in bed, but not bedbound (Capable of only limited self-care, confined to bed or chair 50% or more of waking hours)  4 - Bedbound (Completely disabled. Cannot carry on any self-care. Totally confined to bed or chair)  5 - Death   Santiago Glad MM, Creech RH, Tormey DC, et al. [phone]). "Toxicity and response criteria of the [facility]". Am. Evlyn Clines. Oncol. 5 (6): 649-55    LABORATORY DATA:  Lab Results  Component Value Date   WBC 8.2 12/28/2023   HGB 16.4 12/28/2023   HCT 50.6 12/28/2023   MCV 82.7 12/28/2023   PLT 193 12/28/2023   Lab Results  Component Value Date   NA 137 12/28/2023   K 3.8 12/28/2023   CL 104 12/28/2023   CO2 27 12/28/2023   Lab Results  Component Value Date   ALT 44 12/27/2023   AST 21 12/27/2023   ALKPHOS 38 12/27/2023   BILITOT 0.8 12/27/2023      RADIOGRAPHY: CT HEAD WO CONTRAST ( ) Result Date: 12/26/2023 CLINICAL DATA:  Brain/CNS neoplasm, monitor. EXAM: CT HEAD WITHOUT CONTRAST TECHNIQUE: Contiguous axial images were obtained from the base of the skull through the vertex without intravenous contrast. RADIATION DOSE REDUCTION: This exam was performed according to the departmental dose-optimization program which includes automated exposure control, adjustment of the mA and/or kV according to patient size and/or use of iterative reconstruction technique. COMPARISON:  Head CT 12/15/2023.  MRI brain 12/19/2023. FINDINGS: Brain: Postoperative changes of right frontal craniotomy for resection of a mass centered within the right superior frontal gyrus. Expected small amount of blood products in the resection cavity. Similar extent of surrounding vasogenic edema with partial effacement of the right lateral ventricle. No hydrocephalus, extra-axial collection or midline shift. Vascular: No hyperdense vessel or unexpected calcification. Skull: Right frontal craniotomy. Sinuses/Orbits: No acute finding. Other: None. IMPRESSION: Postoperative changes of right frontal craniotomy for resection of a mass centered within the right superior frontal gyrus. Expected small amount of blood products in the resection cavity. Similar extent of surrounding vasogenic edema with partial effacement of the right lateral ventricle. Electronically Signed   By: Orvan Falconer M.D.   On: 12/26/2023 09:13    VAS [place] LOWER EXTREMITY VENOUS (DVT) Result Date: 12/24/2023  Lower Venous DVT Study Patient Name:  KENYETTA GROSECLOSE  Date of Exam:   12/23/2023 Medical Rec #: [id number]            Accession #:    [id number] Date of Birth: September 04, 1971           Patient Gender: M Patient Age:   68 years Exam [facility] Procedure:      VAS [place] LOWER EXTREMITY VENOUS (DVT) Referring Phys: [facility] BERGMAN --------------------------------------------------------------------------------  Indications: Pain, and Inability to walk due to pain.  Comparison Study: No prior exam. Performing Technologist: Fernande Bras  Examination Guidelines: A complete evaluation includes B-mode imaging, spectral Doppler, color Doppler, and power Doppler as needed of all accessible portions of each vessel. Bilateral testing is considered an integral part of a complete examination. Limited examinations for reoccurring indications may be performed as noted. The reflux portion of the exam is performed with the patient in reverse Trendelenburg.  +-----+---------------+---------+-----------+----------+--------------+ RIGHTCompressibilityPhasicitySpontaneityPropertiesThrombus Aging +-----+---------------+---------+-----------+----------+--------------+ CFV  Full           Yes      Yes                                 +-----+---------------+---------+-----------+----------+--------------+ SFJ  Full           Yes      Yes                                 +-----+---------------+---------+-----------+----------+--------------+   +---------+---------------+---------+-----------+----------+--------------+ LEFT     CompressibilityPhasicitySpontaneityPropertiesThrombus Aging +---------+---------------+---------+-----------+----------+--------------+ CFV      Full           Yes      Yes                                 +---------+---------------+---------+-----------+----------+--------------+ SFJ      Full            Yes      Yes                                 +---------+---------------+---------+-----------+----------+--------------+ FV Prox  Full                                                        +---------+---------------+---------+-----------+----------+--------------+ FV Mid   Full                                                        +---------+---------------+---------+-----------+----------+--------------+ FV DistalFull                                                        +---------+---------------+---------+-----------+----------+--------------+ PFV      Full                                                        +---------+---------------+---------+-----------+----------+--------------+ POP      Full           Yes  Yes                                 +---------+---------------+---------+-----------+----------+--------------+ PTV      Full                                                        +---------+---------------+---------+-----------+----------+--------------+ PERO     None           No       No                                  +---------+---------------+---------+-----------+----------+--------------+     Summary: RIGHT: - No evidence of common femoral vein obstruction.   LEFT: - Findings consistent with acute deep vein thrombosis involving the left peroneal veins.  - No cystic structure found in the popliteal fossa.  *See table(s) above for measurements and observations. Electronically signed by Heath Lark on 12/24/2023 at 12:31:39 PM.    Final    MR BRAIN W WO CONTRAST Result Date: 12/19/2023 CLINICAL DATA:  Follow-up craniotomy. EXAM: MRI HEAD WITHOUT AND WITH CONTRAST TECHNIQUE: Multiplanar, multiecho pulse sequences of the brain and surrounding structures were obtained without and with intravenous contrast. CONTRAST:  10 mL GADAVIST GADOBUTROL 1 MMOL/ML IV SOLN COMPARISON:  Preoperative imaging 12/16/2023 FINDINGS: Brain: Status  post right frontal craniotomy for debulking of a previously seen 5.6 cm hemorrhagic and necrotic mass at the right posterior frontal vertex. Good debulking with collapse of the lesion, the region enhancement around the margins now measuring 3.2 cm cephalo caudal, 2.8 cm front to back and 2.0 cm right to left. Small amount of air or blood products in the central portion. Persistent regional edema as would be expected. Less mass effect. Right-to-left shift now measuring 5 mm. Mass-effect upon the frontal horn of the right lateral ventricle. Other nearby error is of tumor infiltration including the frontal operculum, basal ganglia and cingulate gyrus persist without contrast enhancement in those regions. No hydrocephalus. No extra-axial collection. Vascular: Major vessels at the base of the brain show flow. Skull and upper cervical spine: Negative Sinuses/Orbits: Clear/normal Other: None IMPRESSION: 1. Good debulking of a previously seen 5.6 cm hemorrhagic and necrotic mass at the right posterior frontal vertex. Small amount of air or blood products in the central portion of the lesion. Collapse of the lesion with a region of enhancement now measuring 3.2 x 2.8 x 2.0 cm. Persistent regional edema as would be expected. Less mass effect. Right-to-left shift now measuring 5 mm. Mass-effect upon the frontal horn of the right lateral ventricle. 2. Other nearby areas of tumor infiltration including the frontal operculum, basal ganglia and cingulate gyrus persist without contrast enhancement in those regions. Electronically Signed   By: Paulina Fusi M.D.   On: 12/19/2023 08:58   MR BRAIN W WO CONTRAST Result Date: 12/16/2023 CLINICAL DATA:  Brain neoplasm staging EXAM: MRI HEAD WITHOUT AND WITH CONTRAST TECHNIQUE: Multiplanar, multiecho pulse sequences of the brain and surrounding structures were obtained without and with intravenous contrast. CONTRAST:  10mL GADAVIST GADOBUTROL 1 MMOL/ML IV SOLN COMPARISON:  12/15/2023  brain MRI FINDINGS: An abbreviated protocol was performed, consisting of axial, sagittal and coronal T1-weighted images and  axial FLAIR sequence. Brain: Unchanged size of large mass centered in the right frontal lobe, measuring 4.7 x 3.8 by 4.8 cm. There is a large amount of surrounding vasogenic edema. 8 mm leftward midline shift. This study was performed for stereotactic localization. IMPRESSION: Unchanged size of large mass centered in the right frontal lobe, measuring 4.7 x 3.8 by 4.8 cm. Large amount of surrounding vasogenic edema. 8 mm leftward midline shift. Electronically Signed   By: Deatra Robinson M.D.   On: 12/16/2023 20:44   CT CHEST ABDOMEN PELVIS W CONTRAST Result Date: 12/16/2023 CLINICAL DATA:  CNS neoplasm.  Staging study.  * Tracking Code: BO * EXAM: CT CHEST, ABDOMEN, AND PELVIS WITH CONTRAST TECHNIQUE: Multidetector CT imaging of the chest, abdomen and pelvis was performed following the standard protocol during bolus administration of intravenous contrast. RADIATION DOSE REDUCTION: This exam was performed according to the departmental dose-optimization program which includes automated exposure control, adjustment of the mA and/or kV according to patient size and/or use of iterative reconstruction technique. CONTRAST:  75mL OMNIPAQUE IOHEXOL 350 MG/ML SOLN COMPARISON:  Chest CTA 03/11/2023. FINDINGS: CT CHEST FINDINGS Cardiovascular: The heart size is normal. No substantial pericardial effusion. No thoracic aortic aneurysm. No substantial atherosclerosis of the thoracic aorta. Mediastinum/Nodes: No mediastinal lymphadenopathy. There is no hilar lymphadenopathy. The esophagus has normal imaging features. There is no axillary lymphadenopathy. Lungs/Pleura: Subsegmental atelectasis noted posterior right lung base. No suspicious pulmonary nodule or mass. No focal airspace consolidation. No pleural effusion. Musculoskeletal: No worrisome lytic or sclerotic osseous abnormality. CT ABDOMEN PELVIS  FINDINGS Hepatobiliary: Jen hemangioma identified right hepatic lobe with exophytic hemangioma posterior left hepatic lobe. These were characterized by MRI back in 2014 and are similar in the interval. Gallbladder is decompressed. No intrahepatic or extrahepatic biliary dilation. Pancreas: No focal mass lesion. No dilatation of the main duct. No intraparenchymal cyst. No peripancreatic edema. Spleen: No splenomegaly. No suspicious focal mass lesion. Adrenals/Urinary Tract: No adrenal nodule or mass. Kidneys unremarkable. No evidence for hydroureter. Bladder is decompressed by Foley catheter. Stomach/Bowel: Surgical changes are noted in the stomach, compatible with sleeve gastrectomy. Duodenum is normally positioned as is the ligament of Treitz. No small bowel wall thickening. No small bowel dilatation. The terminal ileum is normal. Nonvisualization of the appendix is consistent with the reported history of appendectomy. No gross colonic mass. No colonic wall thickening. Vascular/Lymphatic: There is mild atherosclerotic calcification of the abdominal aorta without aneurysm. There is no gastrohepatic or hepatoduodenal ligament lymphadenopathy. No retroperitoneal or mesenteric lymphadenopathy. No pelvic sidewall lymphadenopathy. Reproductive: Prostate gland upper normal for size. Other: No intraperitoneal free fluid. Musculoskeletal: No worrisome lytic or sclerotic osseous abnormality. IMPRESSION: 1. No evidence for metastatic disease in the chest, abdomen, or pelvis. 2. Surgical changes of sleeve gastrectomy. 3. Giant right cavernous hemangioma with exophytic left hepatic lobe hemangioma. These were characterized by and are similar to abdominal MRI 11/10/2013. 4.  Aortic Atherosclerosis (ICD10-I70.0). Electronically Signed   By: Kennith Center M.D.   On: 12/16/2023 19:20   CT HEAD WO CONTRAST ( ) Result Date: 12/15/2023 CLINICAL DATA:  Mental status change, persistent or worsening EXAM: CT HEAD WITHOUT CONTRAST  TECHNIQUE: Contiguous axial images were obtained from the base of the skull through the vertex without intravenous contrast. RADIATION DOSE REDUCTION: This exam was performed according to the departmental dose-optimization program which includes automated exposure control, adjustment of the mA and/or kV according to patient size and/or use of iterative reconstruction technique. COMPARISON:  CT head and MRI head from earlier  today. FINDINGS: Brain: In comparison to same day CT head, new/increased 1.7 cm focus of hyperdense hemorrhage along the inferior aspect of the hemorrhagic mass (series 3, image 24; series 5, image 47 in the right frontal lobe with similar mass effect and surrounding edema. No hydrocephalus. No evidence of acute large vascular territory infarct. Vascular: No hyperdense vessel. Skull: No acute fracture. Sinuses/Orbits: Mild paranasal sinus mucosal thickening. No acute orbital findings. IMPRESSION: In comparison to same day CT head, new/increased 1.7 cm focus of hyperdense hemorrhage along the inferior aspect of the hemorrhagic mass in the right frontal lobe with similar mass effect and surrounding edema. The mass is further characterized on same day MRI. Electronically Signed   By: Feliberto Harts M.D.   On: 12/15/2023 15:41   MR Brain W and Wo Contrast Result Date: 12/15/2023 CLINICAL DATA:  Headache. EXAM: MRI HEAD WITHOUT AND WITH CONTRAST TECHNIQUE: Multiplanar, multiecho pulse sequences of the brain and surrounding structures were obtained without and with intravenous contrast. CONTRAST:  10mL GADAVIST GADOBUTROL 1 MMOL/ML IV SOLN COMPARISON:  Head CT from earlier today FINDINGS: Brain: Known mass at the superior right frontal lobe with heterogeneous enhancement and a dominant rim enhancing cavity with irregular margination and internal blood products, the enhancing area in total measures up to 4.5 cm craniocaudal. There is a rim of T2 hyperintensity and expansion with contiguous  infiltrative cortical and white matter T2 signal in the adjacent right frontal lobe that spans both ACA and MCA distributions. This nonenhancing infiltrative appearance is most suggestive of underlying glioma rather than solitary metastasis. There is local mass effect with very early right uncal herniation on coronal T2 weighted imaging. No mass seen in this area on 2016 head CT. Vascular: Major flow voids and vascular enhancements are preserved Skull and upper cervical spine: Normal marrow signal Sinuses/Orbits: Negative IMPRESSION: Necrotic and hemorrhagic mass in the superior right frontal lobe with regional infiltrative signal abnormality, features of glioblastoma. Mass effect causes very early right uncal herniation on coronal images. Electronically Signed   By: Tiburcio Pea M.D.   On: 12/15/2023 08:45   DG Chest Portable 1 View Result Date: 12/15/2023 CLINICAL DATA:  Evaluate for weakness. EXAM: PORTABLE CHEST 1 VIEW COMPARISON:  03/12/2023 FINDINGS: There is asymmetric elevation of the right hemidiaphragm. Normal cardiomediastinal contours. No pleural fluid, interstitial edema or airspace consolidation. Visualized osseous structures are unremarkable. IMPRESSION: 1. No acute cardiopulmonary disease. 2. Asymmetric elevation of the right hemidiaphragm. Electronically Signed   By: Signa Kell M.D.   On: 12/15/2023 06:13   CT ANGIO HEAD NECK W WO CM Result Date: 12/15/2023 CLINICAL DATA:  Neuro deficit with stroke suspected. EXAM: CT ANGIOGRAPHY HEAD AND NECK WITH AND WITHOUT CONTRAST TECHNIQUE: Multidetector CT imaging of the head and neck was performed using the standard protocol during bolus administration of intravenous contrast. Multiplanar CT image reconstructions and MIPs were obtained to evaluate the vascular anatomy. Carotid stenosis measurements (when applicable) are obtained utilizing NASCET criteria, using the distal internal carotid diameter as the denominator. RADIATION DOSE REDUCTION:  This exam was performed according to the departmental dose-optimization program which includes automated exposure control, adjustment of the mA and/or kV according to patient size and/or use of iterative reconstruction technique. CONTRAST:  75mL OMNIPAQUE IOHEXOL 350 MG/ML SOLN COMPARISON:  Head CT from earlier today. FINDINGS: CTA NECK FINDINGS Aortic arch: Unremarkable Right carotid system: Accounting for motion artifact vessels are smoothly contoured and widely patent with mild atheromatous plaque at the bifurcation. Left carotid system: Accounting  for motion artifact vessels are smoothly contoured and diffusely patent with minimal atheromatous plaque at the bifurcation. Vertebral arteries: No proximal subclavian or vertebral stenosis. No evidence of beading or dissection. Skeleton: Posterior longitudinal ligament ossification to a mild degree at mid and lower cervical levels, which could contact the ventral cord. Other neck: No acute or aggressive finding Upper chest: Clear apical lungs. Review of the MIP images confirms the above findings CTA HEAD FINDINGS Anterior circulation: Hemorrhagic lesion at the superior right frontal lobe shows no spot sign or underlying aneurysm/vascular malformation. No major branch occlusion, beading, or flow reducing stenosis. Posterior circulation: The vertebral and basilar arteries are smoothly contoured and diffusely patent. No branch occlusion, beading, or aneurysm. Venous sinuses: Diffusely patent, including at the superior sagittal sinus. Anatomic variants: None significant Review of the MIP images confirms the above findings IMPRESSION: 1. No vascular lesion or spot sign seen at the hemorrhagic right superior frontal lesion. 2. Mild atherosclerosis. 3. Intermittent motion artifact especially affecting the neck. Electronically Signed   By: Tiburcio Pea M.D.   On: 12/15/2023 06:08   CT HEAD WO CONTRAST Result Date: 12/15/2023 CLINICAL DATA:  Neuro deficit with acute  stroke suspected EXAM: CT HEAD WITHOUT CONTRAST TECHNIQUE: Contiguous axial images were obtained from the base of the skull through the vertex without intravenous contrast. RADIATION DOSE REDUCTION: This exam was performed according to the departmental dose-optimization program which includes automated exposure control, adjustment of the mA and/or kV according to patient size and/or use of iterative reconstruction technique. COMPARISON:  08/04/2015 FINDINGS: Brain: Low-density area in the superior right frontal lobe with iso and hyperdense center. The hemorrhagic area measures up to 2.7 x 1.7 cm. Prominent degree of adjacent low-density with isodense and partially cystic center which may favors underlying mass. No hyperdensity in the adjacent superior sagittal sinus. There is local mass effect and leftward midline shift without herniation. No hydrocephalus or extra-axial collection. Vascular: No hyperdense vessel or unexpected calcification. Skull: Normal. Negative for fracture or focal lesion. Sinuses/Orbits: No acute finding Case discussed in epic chat, hemorrhage is already known to provider. IMPRESSION: Partially hemorrhagic lesion in the superior right frontal lobe with local mass effect. Suspect underlying mass, recommend brain MRI with contrast. Electronically Signed   By: Tiburcio Pea M.D.   On: 12/15/2023 06:00       IMPRESSION/PLAN: 1. Glioblastoma of the right frontal lobe.  Today we discussed the findings from the patient's pathology and recent radiographic workup.  We discussed the nature of primary brain malignancy and the rationale for treatment with chemoradiation.  We discussed the risks, benefits, short, and long term effects of radiotherapy, as well as the curative intent, and the patient is interested in proceeding.  I discussed the delivery and logistics of radiotherapy and anticipates a course of 6 weeks of radiotherapy to the right frontal lobe. Written consent is obtained and placed  in the chart, a copy was provided to the patient. The patient will be contacted to coordinate treatment planning by our simulation department. We anticipate starting treatment on 01/22/24.  2. Claustrophobia. We discussed the use of ativan which he found helpful prior to his MRIs when he was hospitalized. A new prescription was sent to his pharmacy and we discussed the side effect profile with the patient.  3. Possible genetic predisposition to malignancy. The patient is a candidate for genetic testing given with hi personal history. He was offered referral and was interested in meeting so formal referral was submitted.  In a visit lasting 60 minutes, greater than 50% of the time was spent face to face discussing the patient's condition, in preparation for the discussion, and coordinating the patient's care.   The above documentation reflects my direct findings during this shared patient visit. Please see the separate note by Dr. Mitzi Hansen on this date for the remainder of the patient's plan of care.    Osker Mason, [facility]   **Disclaimer: This note was dictated with voice recognition software. Similar sounding words can inadvertently be transcribed and this note may contain transcription errors which may not have been corrected upon publication of note.**

## 2024-01-08 NOTE — Therapy (Signed)
 OUTPATIENT PHYSICAL THERAPY NEURO TREATMENT NOTE   Patient Name: Nicholas Stevens MRN: [id number] DOB:[birth year], 53 y.o., male Today's Date: 01/08/2024   PCP: Nicholas Karlynn GAILS., MD REFERRING PROVIDER: Pegge Toribio PARAS, PA-C  END OF SESSION:  PT End of Session - 01/08/24 1235     Visit Number 3    Number of Visits 13    Date for PT Re-Evaluation 02/02/24   pushed out 1 week due to scheduling   Authorization Type [facility]    Authorization Time Period 01-02-24 - 03-01-24    PT Start Time 1234    PT Stop Time 1314    PT Time Calculation (min) 40 min    Equipment Utilized During Treatment --   SOT harness   Activity Tolerance Patient tolerated treatment well    Behavior During Therapy WFL for tasks assessed/performed              Past Medical History:  Diagnosis Date   DDD (degenerative disc disease), lumbar    HNP (herniated nucleus pulposus)    Hypertension    Liver hemangioma    Morbid obesity (HCC)    OSA (obstructive sleep apnea)    no cpap used since weight loss   Overweight(278.02)    Plantar fasciitis of right foot    Sleep apnea    Tinea barbae    Past Surgical History:  Procedure Laterality Date   BREATH TEK H PYLORI N/A 08/20/2013   Procedure: BREATH TEK VEAR Nicholas Stevens;  Surgeon: Nicholas KATHEE Lunger, MD;  Location: [facility];  Service: General;  Laterality: N/A;   COLONOSCOPY WITH PROPOFOL  N/A 05/05/2022   Procedure: COLONOSCOPY WITH PROPOFOL ;  Surgeon: Nicholas Gordy HERO, MD;  Location: [facility];  Service: Gastroenterology;  Laterality: N/A;   CRANIOTOMY Right 12/18/2023   Procedure: Right Frontal Stereotactic Craniotomy for Resection of Tumor;  Surgeon: Nicholas Dorn MATSU, MD;  Location: [facility] OR;  Service: Neurosurgery;  Laterality: Right;   ESOPHAGOGASTRODUODENOSCOPY (EGD) WITH PROPOFOL  N/A 03/15/2023   Procedure: ESOPHAGOGASTRODUODENOSCOPY (EGD) WITH PROPOFOL ;  Surgeon: Nicholas Lupita BRAVO, MD;  Location: [facility];  Service: Gastroenterology;  Laterality: N/A;    LAPAROSCOPIC APPENDECTOMY  2007   Dr Nicholas Stevens   LAPAROSCOPIC GASTRIC SLEEVE RESECTION N/A 10/08/2013   Procedure: LAPAROSCOPIC SLEEVE GASTRECTOMY;  Surgeon: Nicholas KATHEE Lunger, MD;  Location: [facility];  Service: General;  Laterality: N/A;   LUMBAR LAMINECTOMY/DECOMPRESSION MICRODISCECTOMY N/A 09/16/2015   Procedure: MICRO LUMBAR DECOMPRESSION, MICRODISCECTOMY L5 - S1;  Surgeon: Nicholas Billing, MD;  Location: [facility];  Service: Orthopedics;  Laterality: N/A;   POLYPECTOMY  05/05/2022   Procedure: POLYPECTOMY;  Surgeon: Nicholas Gordy HERO, MD;  Location: [facility];  Service: Gastroenterology;;   ROTATOR CUFF REPAIR Right 2005   Dr Nicholas Stevens   Patient Active Problem List   Diagnosis Date Noted   Allergic drug rash 12/29/2023   DVT, lower extremity, distal (HCC) 12/29/2023   Frontal glioblastoma (HCC) 12/26/2023   Brain mass 12/15/2023   Pseudofolliculitis barbae 05/01/2023   Iron deficiency 04/06/2023   Abdominal pain 03/15/2023   Microcytosis 03/14/2023   RUQ pain 03/12/2023   Abnormal EKG 03/12/2023   Chest pain 03/12/2023   Adult acne 01/19/2023   Obesity (BMI 30-39.9) 01/19/2023   Benign neoplasm of cecum    Benign neoplasm of ascending colon    Benign neoplasm of transverse colon    Difficult airway for intubation 01/18/2022   Upper respiratory disease 01/31/2019   Cerumen impaction 07/30/2018   HTN (hypertension) 11/03/2017   Spinal stenosis  of lumbar region 09/16/2015   Insomnia 04/15/2015   Well adult exam 11/04/2014   H/O gastric sleeve 10/08/2013   Hypogonadism, male 07/17/2013   Erectile dysfunction 03/22/2013   Concentration deficit 04/27/2011   Attention or concentration deficit 04/27/2011   TINEA BARBAE 03/04/2009   LIVER HEMANGIOMA 03/04/2009   OVERWEIGHT-BMI 42 03/04/2009   OSA on CPAP 03/04/2009   DEGENERATIVE DISC DISEASE, LUMBAR SPINE 03/04/2009    ONSET DATE: 12-15-23  REFERRING DIAG: C71.9 (ICD-10-CM) - Malignant neoplasm of brain, unspecified  THERAPY DIAG:   Muscle weakness (generalized)  Unsteadiness on feet  Other abnormalities of gait and mobility  Rationale for Evaluation and Treatment: Rehabilitation  SUBJECTIVE:                                                                                                                                                                                             SUBJECTIVE STATEMENT: Did his exercises and reports that his L leg gets a little more sore than it used to. 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 Trying to get back where he was. Feels like 65-70% back to normal   Per chart note:  53 yo male presenting 12/20 to ED with decreased response time while driving, incoordination, imbalance. Imaging showed necrotic and hemorrhagic mass in the superior right frontal lobe with features of glioblastoma with mass effect. S/p R craniotomy 12/23. PMH includes: obesity and OSA.   Pt accompanied by:  wife  - Nicholas Stevens  PERTINENT HISTORY: history of OSA, HTN, Tinea barbae, obesity s/p gastric sleeve, liver hemangioma who was admitted on 12/15/23 with reports of decrease in coordination, minimal use of LUE/LLE and; hospital admission 12-15-23 with transfer to inpatient rehab 12-26-23 - 12-31-23  PAIN:  Are you having pain? No  Vitals:   01/08/24 1240  BP: 138/88  Pulse: 81     PRECAUTIONS: Other: No driving; Fall  RED FLAGS: None   WEIGHT BEARING RESTRICTIONS: No  FALLS: Has patient fallen in last 6 months? No  LIVING ENVIRONMENT: Lives with: lives with their spouse Lives in: House/apartment Stairs: Yes: Internal: 12 steps; can reach both master bedroom on main level  - has not been up steps to 2nd level of home yet as only been home for 2 days Has following equipment at home: None  PLOF: Independent; pt retired from [employer] 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  DIAGNOSTIC FINDINGS: MRI brain done revealing necrotic, hemorrhagic mass in superior right frontal lobe with features of glioblastoma and mass effect with very early right uncal herniation.  COGNITION: Overall cognitive status: Within functional limits for tasks assessed and appears to have slightly delayed processing - see OT eval for details   SENSATION: WFL  COORDINATION: WFL's bil. LE's  POSTURE: No Significant postural limitations  LOWER EXTREMITY ROM:   WNL's bil. LE's   LOWER EXTREMITY MMT:  RLE WNL's  MMT Right Eval Left Eval  Hip flexion 5 4-  Hip extension 5 3-  Hip abduction  4  Hip adduction    Hip internal rotation    Hip external rotation    Knee flexion 5 4-  Knee extension 5 5  Ankle dorsiflexion 5 5  Ankle plantarflexion 5 5  Ankle inversion    Ankle eversion    (Blank rows = not tested)  BED MOBILITY:  Independent  TRANSFERS: Assistive device utilized: None  Sit to stand: Modified independence Stand to sit: Modified independence  RAMP: TBA  CURB: TBA  STAIRS: TBA   GAIT: Gait pattern: step through pattern and decreased arm swing- Left Distance walked: 100' Assistive device utilized: None Level of assistance: SBA Comments: mildly decreased Lt arm swing   FUNCTIONAL TESTS:  5 times sit to stand: 11.69 secs from chair without UE support Timed up and go (TUG): 10.87 secs without device 10 meter walk test: 11.68 secs without device = 2.81 ft/sec Berg Balance Scale: 54/56     PATIENT SURVEYS:  N/A due to dx of brain tumor                                                                                                                              TREATMENT DATE:01/08/24  Vitals:   01/08/24 1240   BP: 138/88  Pulse: 81     Sensory Organization Test performed with following results: Conditions: 1: 3 trials WNL 2: 3 trials below  3: 3 trials below  4: 1 trial below, 2 trials WNL 5: 1 trial below, 2 trials WNL 6: 2 falls, 1 trial WNL Composite score: 64 (below normal)  Sensory Analysis Som: ~85 (slightly below normal)  Vis: Above normal  Vest: Above normal  Pref: Below normal    Strategy analysis: Slightly more ankle than hip strategy        COG alignment: Equal, in center         Added below exercises to HEP to work on balance: - Romberg Stance Eyes Closed on Foam Pad  - 1 x daily - 5 x weekly - 3 sets - 30 hold - Narrow Stance with Eyes Closed and Head Rotation on Foam Pad  - 1 x daily - 5 x weekly - 2 sets - 10 reps - also  head nods   Pt planning to purchase balance pad for home use.   PATIENT EDUCATION: Education details: Results of SOT, added balance tasks to HEP with EC, head motions, compliant surfaces with pt with incr postural sway  Person educated: Patient and wife Education method: Explanation, Demonstration, Verbal cues, and Handouts Education comprehension: verbalized understanding and returned demonstration  HOME EXERCISE PROGRAM: Access Code: [email or web address] URL: [email or web address] Date: 01/04/2024 Prepared by: Rock Kussmaul  Exercises - 2# weight used for SLR, knee flexion and clam shell and hip abduction  - Supine Active Straight Leg Raise  - 1 x daily - 7 x weekly - 3 sets - 10 reps - SIDELYING LEG LIFTS; ALSO DO CIRCLES CLOCKWISE & COUNTERCLOCKWISE  - 1 x daily - 7 x weekly - 3 sets - 10 reps - Prone Hip Extension  - 1 x daily - 7 x weekly - 3 sets - 10 reps - Clamshell  - 1 x daily - 7 x weekly - 3 sets - 10 reps - 5 sec hold - Prone Knee Flexion AROM  - 1 x daily - 7 x weekly - 3 sets - 10 reps - Single Leg Stance  - 1 x daily - 7 x weekly - 1 sets - 2-3 reps - 15 sec hold - Tandem Stance  - 1 x daily - 7 x weekly - 1 sets - 2 reps  - 30 sec  hold - Romberg Stance Eyes Closed on Foam Pad  - 1 x daily - 5 x weekly - 3 sets - 30 hold - Narrow Stance with Eyes Closed and Head Rotation on Foam Pad  - 1 x daily - 5 x weekly - 2 sets - 10 reps  GOALS: Goals reviewed with patient? Yes  SHORT TERM GOALS: SAME AS LTG'S AS ELOS = 4 WEEKS     LONG TERM GOALS: Target date: 02-02-24  Assess FGA and set goal as appropriate. Baseline:  Goal status: INITIAL  2.  Improve Berg score to 56/56 to demo improved static standing balance. Baseline: 54/56 on 01-02-24 Goal status: INITIAL  3.  Pt will amb. 10 nonstop on various surfaces including grass, pavement and indoor, without LOB and with no c/o fatigue, demonstrating Lt arm swing, with supervision.  Baseline:  Goal status: INITIAL  4.  Increase gait velocity to >/= 3.6 ft/sec without use of device for increased gait efficiency.  Baseline: 2.81 ft/sec (11.68 secs) Goal status: INITIAL  5.  Pt will jog 115' on flat, even surface independently without LOB and RPE </= 2/10.  Baseline:  Goal status: INITIAL  6.  Negotiate 4 steps using step over step sequence with supervision without use of hand rail to demo improved balance. Baseline:  Goal status: INITIAL  7.  Independent in HEP for LLE strengthening, balance and coordination.   Baseline:  Goal status: INITIAL  8.  Pt will improve composite score to above normal for age in order to demo improved balance.   Baseline: 64 (below normal)   Goal status: INITIAL  ASSESSMENT:  CLINICAL IMPRESSION:  Today's skilled session focused on performing the Sensory Organization Test for further assessment of balance. Pt scoring below normal for age on composite score and somatosensory on sensory analysis (see above for further details). Added LTG for balance. Remainder of session focused on adding balance tasks with EC on compliant surfaces to HEP due to incr postural sway. Pt tolerated session well, will continue per POC.     OBJECTIVE IMPAIRMENTS: Abnormal gait,  decreased activity tolerance, decreased balance, decreased coordination, and decreased strength.   ACTIVITY LIMITATIONS: carrying, lifting, squatting, stairs, and locomotion level  PARTICIPATION LIMITATIONS: cleaning, laundry, driving, shopping, community activity, and occupation  PERSONAL FACTORS: Profession and 1 comorbidity: s/p craniotomy due to brain tumor  are also affecting patient's functional outcome.   REHAB POTENTIAL: Good  CLINICAL DECISION MAKING: Evolving/moderate complexity  EVALUATION COMPLEXITY: Moderate  PLAN:  PT FREQUENCY: 3x/week  PT DURATION: 4 weeks  PLANNED INTERVENTIONS: 97110-Therapeutic exercises, 97530- Therapeutic activity, 97112- Neuromuscular re-education, [phone]- Self Care, [id number]- Gait training, [phone]- Aquatic Therapy, Patient/Family education, Balance training, and Stair training  PLAN FOR NEXT SESSION: work on LLE strengthening, elliptical, high level balance and strengthening, balance with EC/compliant surfaces.    Sheffield LOISE Senate, PT, DPT 01/08/2024, 1:58 PM

## 2024-01-08 NOTE — Progress Notes (Signed)
 [facility] at [facility] [address]  [place], [place] [address] [phone]   New Patient Evaluation  Date of Service: 01/08/24 Patient Name: Nicholas Stevens Patient MRN: [id number] Patient DOB: [birth year] Provider: Arthea MARLA Manns, MD  Identifying Statement:  Nicholas Stevens is a 53 y.o. male with right frontal glioblastoma who presents for initial consultation and evaluation.    Referring Provider: Garald Karlynn GAILS, MD [address] [place],  [place] [address]  Oncologic History: Oncology History  Frontal glioblastoma [facility])  12/18/2023 Surgery   Craniotomy, resection of right frontal mass with Dr. Debby; path is glioblastoma, IDH pending     Biomarkers:  MGMT Unknown.  IDH 1/2 Unknown.  EGFR Unknown  TERT Unknown   History of Present Illness: The patient's records from the referring physician were obtained and reviewed and the patient interviewed to confirm this HPI.  Nicholas Stevens presented with several days of left sided clumsiness, impaired coordination.  Was found to have large right frontal mass, c/w primary brain tumor.  He underwent craniotomy, resection with Dr. Debby on 12/18/23; path demonstrated glioblastoma.  Following surgery, he had persistent right sided weakness, he is currently in outpatient PT following discharge from rehab.  No seizures, headaches.     Medications: Current Outpatient Medications on File Prior to Visit  Medication Sig Dispense Refill   apixaban  (ELIQUIS ) 5 MG TABS tablet Take 1 tablet (5 mg total) by mouth 2 (two) times daily. 60 tablet 0   camphor-menthol  (SARNA) lotion Apply topically as needed for itching. 222 mL 0   Cyanocobalamin  (B-12 PO) Take 1 tablet by mouth daily.     fexofenadine  (ALLEGRA ) 60 MG tablet Take 1 tablet (60 mg total) by mouth 2 (two) times daily. 60 tablet 0   latanoprost  (XALATAN ) 0.005 % ophthalmic solution Place 1 drop into both eyes at bedtime.      levETIRAcetam  (KEPPRA ) 500 MG tablet Take 1 tablet (500 mg total) by mouth 2 (two) times daily. 60 tablet 0   Multiple Vitamin (MULTIVITAMIN WITH MINERALS) TABS tablet Take 1 tablet by mouth daily.     Omega-3 Fatty Acids (FISH OIL) 1000 MG CAPS Take 1,000 mg by mouth daily.     pantoprazole  (PROTONIX ) 40 MG tablet Take 1 tablet (40 mg total) by mouth daily. 30 tablet 0   pimecrolimus  (ELIDEL ) 1 % cream Apply 1 Application topically 2 (two) times daily as needed (Dermatitis).     senna-docusate (SENOKOT-S) 8.6-50 MG tablet Take 2 tablets by mouth daily at 6 (six) AM. 60 tablet 0   zolpidem  (AMBIEN ) 10 MG tablet TAKE 1 TABLET BY MOUTH EVERYDAY AT BEDTIME 90 tablet 1   No current facility-administered medications on file prior to visit.    Allergies: No Known Allergies Past Medical History:  Past Medical History:  Diagnosis Date   DDD (degenerative disc disease), lumbar    HNP (herniated nucleus pulposus)    Hypertension    Liver hemangioma    Morbid obesity (HCC)    OSA (obstructive sleep apnea)    no cpap used since weight loss   Overweight(278.02)    Plantar fasciitis of right foot    Sleep apnea    Tinea barbae    Past Surgical History:  Past Surgical History:  Procedure Laterality Date   BREATH TEK H PYLORI N/A 08/20/2013   Procedure: BREATH TEK H PYLORI;  Surgeon: Donnice KATHEE Lunger, MD;  Location: [facility];  Service: General;  Laterality: N/A;  COLONOSCOPY WITH PROPOFOL  N/A 05/05/2022   Procedure: COLONOSCOPY WITH PROPOFOL ;  Surgeon: Albertus Gordy HERO, MD;  Location: [facility];  Service: Gastroenterology;  Laterality: N/A;   CRANIOTOMY Right 12/18/2023   Procedure: Right Frontal Stereotactic Craniotomy for Resection of Tumor;  Surgeon: Debby Dorn MATSU, MD;  Location: [facility] OR;  Service: Neurosurgery;  Laterality: Right;   ESOPHAGOGASTRODUODENOSCOPY (EGD) WITH PROPOFOL  N/A 03/15/2023   Procedure: ESOPHAGOGASTRODUODENOSCOPY (EGD) WITH PROPOFOL ;  Surgeon: Avram Lupita BRAVO, MD;   Location: [facility];  Service: Gastroenterology;  Laterality: N/A;   LAPAROSCOPIC APPENDECTOMY  2007   Dr Curvin   LAPAROSCOPIC GASTRIC SLEEVE RESECTION N/A 10/08/2013   Procedure: LAPAROSCOPIC SLEEVE GASTRECTOMY;  Surgeon: Donnice KATHEE Lunger, MD;  Location: [facility];  Service: General;  Laterality: N/A;   LUMBAR LAMINECTOMY/DECOMPRESSION MICRODISCECTOMY N/A 09/16/2015   Procedure: MICRO LUMBAR DECOMPRESSION, MICRODISCECTOMY L5 - S1;  Surgeon: Reyes Billing, MD;  Location: [facility];  Service: Orthopedics;  Laterality: N/A;   POLYPECTOMY  05/05/2022   Procedure: POLYPECTOMY;  Surgeon: Albertus Gordy HERO, MD;  Location: [facility];  Service: Gastroenterology;;   ROTATOR CUFF REPAIR Right 2005   Dr Harden   Social History:  Social History   Socioeconomic History   Marital status: Married    Spouse name: Kalyb Pemble   Number of children: 3   Years of education: Not on file   Highest education level: 12th grade  Occupational History   Occupation: Doctor, General Practice PD    Employer: UNEMPLOYED  Tobacco Use   Smoking status: Never    Passive exposure: Never   Smokeless tobacco: Never  Vaping Use   Vaping status: Never Used  Substance and Sexual Activity   Alcohol  use: No   Drug use: No   Sexual activity: Not on file  Other Topics Concern   Not on file  Social History Narrative   Married 18+ years   3 children - ages approx 26, 9 and 5...daughter age 59 w/ DM type 1   Social Drivers of Corporate Investment Banker Strain: Low Risk  (04/02/2023)   Overall Financial Resource Strain (CARDIA)    Difficulty of Paying Living Expenses: Not hard at all  Food Insecurity: No Food Insecurity (12/18/2023)   Hunger Vital Sign    Worried About Running Out of Food in the Last Year: Never true    Ran Out of Food in the Last Year: Never true  Transportation Needs: No Transportation Needs (12/18/2023)   PRAPARE - Administrator, Civil Service (Medical): No    Lack of Transportation (Non-Medical): No   Physical Activity: Insufficiently Active (04/02/2023)   Exercise Vital Sign    Days of Exercise per Week: 4 days    Minutes of Exercise per Session: 30 min  Stress: No Stress Concern Present (04/02/2023)   [employer] of Occupational Health - Occupational Stress Questionnaire    Feeling of Stress : Not at all  Social Connections: Socially Integrated (04/02/2023)   Social Connection and Isolation Panel [NHANES]    Frequency of Communication with Friends and Family: More than three times a week    Frequency of Social Gatherings with Friends and Family: Once a week    Attends Religious Services: More than 4 times per year    Active Member of [employer] or Organizations: Yes    Attends Banker Meetings: More than 4 times per year    Marital Status: Married  Catering Manager Violence: Not At Risk (12/18/2023)   Humiliation, Afraid, Rape, and  Kick questionnaire    Fear of Current or Ex-Partner: No    Emotionally Abused: No    Physically Abused: No    Sexually Abused: No   Family History:  Family History  Problem Relation Age of Onset   Other Mother        hx of DJD   Liver disease Father        ETOH, Hep C   Hypertension Father    Colon cancer Neg Hx    Colon polyps Neg Hx    Esophageal cancer Neg Hx    Rectal cancer Neg Hx    Stomach cancer Neg Hx     Review of Systems: Constitutional: Doesn't report fevers, chills or abnormal weight loss Eyes: Doesn't report blurriness of vision Ears, nose, mouth, throat, and face: Doesn't report sore throat Respiratory: Doesn't report cough, dyspnea or wheezes Cardiovascular: Doesn't report palpitation, chest discomfort  Gastrointestinal:  Doesn't report nausea, constipation, diarrhea GU: Doesn't report incontinence Skin: Doesn't report skin rashes Neurological: Per HPI Musculoskeletal: Doesn't report joint pain Behavioral/Psych: Doesn't report anxiety  Physical Exam: Vitals:   01/08/24 1510  BP: (!) 146/84  Pulse: 75   Resp: 14  Temp: 98.2 F (36.8 C)  SpO2: 98%   KPS: 80. General: Alert, cooperative, pleasant, in no acute distress Head: Normal EENT: No conjunctival injection or scleral icterus.  Lungs: Resp effort normal Cardiac: Regular rate Abdomen: Non-distended abdomen Skin: No rashes cyanosis or petechiae. Extremities: No clubbing or edema  Neurologic Exam: Mental Status: Awake, alert, attentive to examiner. Oriented to self and environment. Language is fluent with intact comprehension.  Cranial Nerves: Visual acuity is grossly normal. Visual fields are full. Extra-ocular movements intact. No ptosis. Face is symmetric Motor: Tone and bulk are normal. Power is 4+/5 in left arm and leg. Reflexes are symmetric, no pathologic reflexes present.  Sensory: Intact to light touch Gait: Normal.   Labs: I have reviewed the data as listed    Component Value Date/Time   NA 137 12/28/2023 0604   K 3.8 12/28/2023 0604   CL 104 12/28/2023 0604   CO2 27 12/28/2023 0604   GLUCOSE 98 12/28/2023 0604   BUN 13 12/28/2023 0604   CREATININE 1.05 12/28/2023 0604   CALCIUM 8.0 (L) 12/28/2023 0604   PROT 5.5 (L) 12/27/2023 0634   ALBUMIN 2.6 (L) 12/27/2023 0634   AST 21 12/27/2023 0634   ALT 44 12/27/2023 0634   ALKPHOS 38 12/27/2023 0634   BILITOT 0.8 12/27/2023 0634   GFRNONAA >60 12/28/2023 0604   GFRAA >60 09/15/2015 1520   Lab Results  Component Value Date   WBC 8.2 12/28/2023   NEUTROABS 7.7 12/27/2023   HGB 16.4 12/28/2023   HCT 50.6 12/28/2023   MCV 82.7 12/28/2023   PLT 193 12/28/2023    Imaging:  CT HEAD WO CONTRAST ( ) Result Date: 12/26/2023 CLINICAL DATA:  Brain/CNS neoplasm, monitor. EXAM: CT HEAD WITHOUT CONTRAST TECHNIQUE: Contiguous axial images were obtained from the base of the skull through the vertex without intravenous contrast. RADIATION DOSE REDUCTION: This exam was performed according to the departmental dose-optimization program which includes automated exposure  control, adjustment of the mA and/or kV according to patient size and/or use of iterative reconstruction technique. COMPARISON:  Head CT 12/15/2023.  MRI brain 12/19/2023. FINDINGS: Brain: Postoperative changes of right frontal craniotomy for resection of a mass centered within the right superior frontal gyrus. Expected small amount of blood products in the resection cavity. Similar extent of surrounding vasogenic edema  with partial effacement of the right lateral ventricle. No hydrocephalus, extra-axial collection or midline shift. Vascular: No hyperdense vessel or unexpected calcification. Skull: Right frontal craniotomy. Sinuses/Orbits: No acute finding. Other: None. IMPRESSION: Postoperative changes of right frontal craniotomy for resection of a mass centered within the right superior frontal gyrus. Expected small amount of blood products in the resection cavity. Similar extent of surrounding vasogenic edema with partial effacement of the right lateral ventricle. Electronically Signed   By: Ryan Chess M.D.   On: 12/26/2023 09:13   VAS US  LOWER EXTREMITY VENOUS (DVT) Result Date: 12/24/2023  Lower Venous DVT Study Patient Name:  Nicholas Stevens  Date of Exam:   12/23/2023 Medical Rec #: [id number]            Accession #:    [id number] Date of Birth: 1971/09/25           Patient Gender: M Patient Age:   50 years Exam Location:  [facility] Procedure:      VAS US  LOWER EXTREMITY VENOUS (DVT) Referring Phys: [facility] BERGMAN --------------------------------------------------------------------------------  Indications: Pain, and Inability to walk due to pain.  Comparison Study: No prior exam. Performing Technologist: Edilia Elden Appl  Examination Guidelines: A complete evaluation includes B-mode imaging, spectral Doppler, color Doppler, and power Doppler as needed of all accessible portions of each vessel. Bilateral testing is considered an integral part of a complete examination. Limited  examinations for reoccurring indications may be performed as noted. The reflux portion of the exam is performed with the patient in reverse Trendelenburg.  +-----+---------------+---------+-----------+----------+--------------+ RIGHTCompressibilityPhasicitySpontaneityPropertiesThrombus Aging +-----+---------------+---------+-----------+----------+--------------+ CFV  Full           Yes      Yes                                 +-----+---------------+---------+-----------+----------+--------------+ SFJ  Full           Yes      Yes                                 +-----+---------------+---------+-----------+----------+--------------+   +---------+---------------+---------+-----------+----------+--------------+ LEFT     CompressibilityPhasicitySpontaneityPropertiesThrombus Aging +---------+---------------+---------+-----------+----------+--------------+ CFV      Full           Yes      Yes                                 +---------+---------------+---------+-----------+----------+--------------+ SFJ      Full           Yes      Yes                                 +---------+---------------+---------+-----------+----------+--------------+ FV Prox  Full                                                        +---------+---------------+---------+-----------+----------+--------------+ FV Mid   Full                                                        +---------+---------------+---------+-----------+----------+--------------+  FV DistalFull                                                        +---------+---------------+---------+-----------+----------+--------------+ PFV      Full                                                        +---------+---------------+---------+-----------+----------+--------------+ POP      Full           Yes      Yes                                  +---------+---------------+---------+-----------+----------+--------------+ PTV      Full                                                        +---------+---------------+---------+-----------+----------+--------------+ PERO     None           No       No                                  +---------+---------------+---------+-----------+----------+--------------+     Summary: RIGHT: - No evidence of common femoral vein obstruction.   LEFT: - Findings consistent with acute deep vein thrombosis involving the left peroneal veins.  - No cystic structure found in the popliteal fossa.  *See table(s) above for measurements and observations. Electronically signed by Debby Robertson on 12/24/2023 at 12:31:39 PM.    Final    MR BRAIN W WO CONTRAST Result Date: 12/19/2023 CLINICAL DATA:  Follow-up craniotomy. EXAM: MRI HEAD WITHOUT AND WITH CONTRAST TECHNIQUE: Multiplanar, multiecho pulse sequences of the brain and surrounding structures were obtained without and with intravenous contrast. CONTRAST:  10 mL GADAVIST  GADOBUTROL  1 MMOL/ML IV SOLN COMPARISON:  Preoperative imaging 12/16/2023 FINDINGS: Brain: Status post right frontal craniotomy for debulking of a previously seen 5.6 cm hemorrhagic and necrotic mass at the right posterior frontal vertex. Good debulking with collapse of the lesion, the region enhancement around the margins now measuring 3.2 cm cephalo caudal, 2.8 cm front to back and 2.0 cm right to left. Small amount of air or blood products in the central portion. Persistent regional edema as would be expected. Less mass effect. Right-to-left shift now measuring 5 mm. Mass-effect upon the frontal horn of the right lateral ventricle. Other nearby error is of tumor infiltration including the frontal operculum, basal ganglia and cingulate gyrus persist without contrast enhancement in those regions. No hydrocephalus. No extra-axial collection. Vascular: Major vessels at the base of the brain show flow.  Skull and upper cervical spine: Negative Sinuses/Orbits: Clear/normal Other: None IMPRESSION: 1. Good debulking of a previously seen 5.6 cm hemorrhagic and necrotic mass at the right posterior frontal vertex. Small amount of air or blood products in the central portion of the lesion. Collapse of the lesion with a  region of enhancement now measuring 3.2 x 2.8 x 2.0 cm. Persistent regional edema as would be expected. Less mass effect. Right-to-left shift now measuring 5 mm. Mass-effect upon the frontal horn of the right lateral ventricle. 2. Other nearby areas of tumor infiltration including the frontal operculum, basal ganglia and cingulate gyrus persist without contrast enhancement in those regions. Electronically Signed   By: Oneil Officer M.D.   On: 12/19/2023 08:58   MR BRAIN W WO CONTRAST Result Date: 12/16/2023 CLINICAL DATA:  Brain neoplasm staging EXAM: MRI HEAD WITHOUT AND WITH CONTRAST TECHNIQUE: Multiplanar, multiecho pulse sequences of the brain and surrounding structures were obtained without and with intravenous contrast. CONTRAST:  10mL GADAVIST  GADOBUTROL  1 MMOL/ML IV SOLN COMPARISON:  12/15/2023 brain MRI FINDINGS: An abbreviated protocol was performed, consisting of axial, sagittal and coronal T1-weighted images and axial FLAIR sequence. Brain: Unchanged size of large mass centered in the right frontal lobe, measuring 4.7 x 3.8 by 4.8 cm. There is a large amount of surrounding vasogenic edema. 8 mm leftward midline shift. This study was performed for stereotactic localization. IMPRESSION: Unchanged size of large mass centered in the right frontal lobe, measuring 4.7 x 3.8 by 4.8 cm. Large amount of surrounding vasogenic edema. 8 mm leftward midline shift. Electronically Signed   By: Franky Stanford M.D.   On: 12/16/2023 20:44   CT CHEST ABDOMEN PELVIS W CONTRAST Result Date: 12/16/2023 CLINICAL DATA:  CNS neoplasm.  Staging study.  * Tracking Code: BO * EXAM: CT CHEST, ABDOMEN, AND PELVIS WITH  CONTRAST TECHNIQUE: Multidetector CT imaging of the chest, abdomen and pelvis was performed following the standard protocol during bolus administration of intravenous contrast. RADIATION DOSE REDUCTION: This exam was performed according to the departmental dose-optimization program which includes automated exposure control, adjustment of the mA and/or kV according to patient size and/or use of iterative reconstruction technique. CONTRAST:  75mL OMNIPAQUE  IOHEXOL  350 MG/ML SOLN COMPARISON:  Chest CTA 03/11/2023. FINDINGS: CT CHEST FINDINGS Cardiovascular: The heart size is normal. No substantial pericardial effusion. No thoracic aortic aneurysm. No substantial atherosclerosis of the thoracic aorta. Mediastinum/Nodes: No mediastinal lymphadenopathy. There is no hilar lymphadenopathy. The esophagus has normal imaging features. There is no axillary lymphadenopathy. Lungs/Pleura: Subsegmental atelectasis noted posterior right lung base. No suspicious pulmonary nodule or mass. No focal airspace consolidation. No pleural effusion. Musculoskeletal: No worrisome lytic or sclerotic osseous abnormality. CT ABDOMEN PELVIS FINDINGS Hepatobiliary: Jen hemangioma identified right hepatic lobe with exophytic hemangioma posterior left hepatic lobe. These were characterized by MRI back in 2014 and are similar in the interval. Gallbladder is decompressed. No intrahepatic or extrahepatic biliary dilation. Pancreas: No focal mass lesion. No dilatation of the main duct. No intraparenchymal cyst. No peripancreatic edema. Spleen: No splenomegaly. No suspicious focal mass lesion. Adrenals/Urinary Tract: No adrenal nodule or mass. Kidneys unremarkable. No evidence for hydroureter. Bladder is decompressed by Foley catheter. Stomach/Bowel: Surgical changes are noted in the stomach, compatible with sleeve gastrectomy. Duodenum is normally positioned as is the ligament of Treitz. No small bowel wall thickening. No small bowel dilatation. The  terminal ileum is normal. Nonvisualization of the appendix is consistent with the reported history of appendectomy. No gross colonic mass. No colonic wall thickening. Vascular/Lymphatic: There is mild atherosclerotic calcification of the abdominal aorta without aneurysm. There is no gastrohepatic or hepatoduodenal ligament lymphadenopathy. No retroperitoneal or mesenteric lymphadenopathy. No pelvic sidewall lymphadenopathy. Reproductive: Prostate gland upper normal for size. Other: No intraperitoneal free fluid. Musculoskeletal: No worrisome lytic or sclerotic osseous abnormality. IMPRESSION:  1. No evidence for metastatic disease in the chest, abdomen, or pelvis. 2. Surgical changes of sleeve gastrectomy. 3. Giant right cavernous hemangioma with exophytic left hepatic lobe hemangioma. These were characterized by and are similar to abdominal MRI 11/10/2013. 4.  Aortic Atherosclerosis (ICD10-I70.0). Electronically Signed   By: Camellia Candle M.D.   On: 12/16/2023 19:20   CT HEAD WO CONTRAST ( ) Result Date: 12/15/2023 CLINICAL DATA:  Mental status change, persistent or worsening EXAM: CT HEAD WITHOUT CONTRAST TECHNIQUE: Contiguous axial images were obtained from the base of the skull through the vertex without intravenous contrast. RADIATION DOSE REDUCTION: This exam was performed according to the departmental dose-optimization program which includes automated exposure control, adjustment of the mA and/or kV according to patient size and/or use of iterative reconstruction technique. COMPARISON:  CT head and MRI head from earlier today. FINDINGS: Brain: In comparison to same day CT head, new/increased 1.7 cm focus of hyperdense hemorrhage along the inferior aspect of the hemorrhagic mass (series 3, image 24; series 5, image 47 in the right frontal lobe with similar mass effect and surrounding edema. No hydrocephalus. No evidence of acute large vascular territory infarct. Vascular: No hyperdense vessel. Skull: No  acute fracture. Sinuses/Orbits: Mild paranasal sinus mucosal thickening. No acute orbital findings. IMPRESSION: In comparison to same day CT head, new/increased 1.7 cm focus of hyperdense hemorrhage along the inferior aspect of the hemorrhagic mass in the right frontal lobe with similar mass effect and surrounding edema. The mass is further characterized on same day MRI. Electronically Signed   By: Gilmore GORMAN Molt M.D.   On: 12/15/2023 15:41   MR Brain W and Wo Contrast Result Date: 12/15/2023 CLINICAL DATA:  Headache. EXAM: MRI HEAD WITHOUT AND WITH CONTRAST TECHNIQUE: Multiplanar, multiecho pulse sequences of the brain and surrounding structures were obtained without and with intravenous contrast. CONTRAST:  10mL GADAVIST  GADOBUTROL  1 MMOL/ML IV SOLN COMPARISON:  Head CT from earlier today FINDINGS: Brain: Known mass at the superior right frontal lobe with heterogeneous enhancement and a dominant rim enhancing cavity with irregular margination and internal blood products, the enhancing area in total measures up to 4.5 cm craniocaudal. There is a rim of T2 hyperintensity and expansion with contiguous infiltrative cortical and white matter T2 signal in the adjacent right frontal lobe that spans both ACA and MCA distributions. This nonenhancing infiltrative appearance is most suggestive of underlying glioma rather than solitary metastasis. There is local mass effect with very early right uncal herniation on coronal T2 weighted imaging. No mass seen in this area on 2016 head CT. Vascular: Major flow voids and vascular enhancements are preserved Skull and upper cervical spine: Normal marrow signal Sinuses/Orbits: Negative IMPRESSION: Necrotic and hemorrhagic mass in the superior right frontal lobe with regional infiltrative signal abnormality, features of glioblastoma. Mass effect causes very early right uncal herniation on coronal images. Electronically Signed   By: Dorn Roulette M.D.   On: 12/15/2023 08:45    DG Chest Portable 1 View Result Date: 12/15/2023 CLINICAL DATA:  Evaluate for weakness. EXAM: PORTABLE CHEST 1 VIEW COMPARISON:  03/12/2023 FINDINGS: There is asymmetric elevation of the right hemidiaphragm. Normal cardiomediastinal contours. No pleural fluid, interstitial edema or airspace consolidation. Visualized osseous structures are unremarkable. IMPRESSION: 1. No acute cardiopulmonary disease. 2. Asymmetric elevation of the right hemidiaphragm. Electronically Signed   By: Waddell Calk M.D.   On: 12/15/2023 06:13   CT ANGIO HEAD NECK W WO CM Result Date: 12/15/2023 CLINICAL DATA:  Neuro deficit with stroke suspected.  EXAM: CT ANGIOGRAPHY HEAD AND NECK WITH AND WITHOUT CONTRAST TECHNIQUE: Multidetector CT imaging of the head and neck was performed using the standard protocol during bolus administration of intravenous contrast. Multiplanar CT image reconstructions and MIPs were obtained to evaluate the vascular anatomy. Carotid stenosis measurements (when applicable) are obtained utilizing NASCET criteria, using the distal internal carotid diameter as the denominator. RADIATION DOSE REDUCTION: This exam was performed according to the departmental dose-optimization program which includes automated exposure control, adjustment of the mA and/or kV according to patient size and/or use of iterative reconstruction technique. CONTRAST:  75mL OMNIPAQUE  IOHEXOL  350 MG/ML SOLN COMPARISON:  Head CT from earlier today. FINDINGS: CTA NECK FINDINGS Aortic arch: Unremarkable Right carotid system: Accounting for motion artifact vessels are smoothly contoured and widely patent with mild atheromatous plaque at the bifurcation. Left carotid system: Accounting for motion artifact vessels are smoothly contoured and diffusely patent with minimal atheromatous plaque at the bifurcation. Vertebral arteries: No proximal subclavian or vertebral stenosis. No evidence of beading or dissection. Skeleton: Posterior longitudinal  ligament ossification to a mild degree at mid and lower cervical levels, which could contact the ventral cord. Other neck: No acute or aggressive finding Upper chest: Clear apical lungs. Review of the MIP images confirms the above findings CTA HEAD FINDINGS Anterior circulation: Hemorrhagic lesion at the superior right frontal lobe shows no spot sign or underlying aneurysm/vascular malformation. No major branch occlusion, beading, or flow reducing stenosis. Posterior circulation: The vertebral and basilar arteries are smoothly contoured and diffusely patent. No branch occlusion, beading, or aneurysm. Venous sinuses: Diffusely patent, including at the superior sagittal sinus. Anatomic variants: None significant Review of the MIP images confirms the above findings IMPRESSION: 1. No vascular lesion or spot sign seen at the hemorrhagic right superior frontal lesion. 2. Mild atherosclerosis. 3. Intermittent motion artifact especially affecting the neck. Electronically Signed   By: Dorn Roulette M.D.   On: 12/15/2023 06:08   CT HEAD WO CONTRAST Result Date: 12/15/2023 CLINICAL DATA:  Neuro deficit with acute stroke suspected EXAM: CT HEAD WITHOUT CONTRAST TECHNIQUE: Contiguous axial images were obtained from the base of the skull through the vertex without intravenous contrast. RADIATION DOSE REDUCTION: This exam was performed according to the departmental dose-optimization program which includes automated exposure control, adjustment of the mA and/or kV according to patient size and/or use of iterative reconstruction technique. COMPARISON:  08/04/2015 FINDINGS: Brain: Low-density area in the superior right frontal lobe with iso and hyperdense center. The hemorrhagic area measures up to 2.7 x 1.7 cm. Prominent degree of adjacent low-density with isodense and partially cystic center which may favors underlying mass. No hyperdensity in the adjacent superior sagittal sinus. There is local mass effect and leftward  midline shift without herniation. No hydrocephalus or extra-axial collection. Vascular: No hyperdense vessel or unexpected calcification. Skull: Normal. Negative for fracture or focal lesion. Sinuses/Orbits: No acute finding Case discussed in epic chat, hemorrhage is already known to provider. IMPRESSION: Partially hemorrhagic lesion in the superior right frontal lobe with local mass effect. Suspect underlying mass, recommend brain MRI with contrast. Electronically Signed   By: Dorn Roulette M.D.   On: 12/15/2023 06:00    Pathology: SURGICAL PATHOLOGY CASE: [id number] PATIENT: Nicholas Stevens Surgical Pathology Report  Clinical History: brain tumor (cm)  FINAL MICROSCOPIC DIAGNOSIS:  A. BRAIN, RIGHT FRONTAL LOBE, TUMOR, BIOPSY: Focal high-grade glioma with marked vascular proliferation and extensive necrosis  B. BRAIN, RIGHT FRONTAL LOBE, TUMOR, RESECTION: High-grade glioma WHO grade 4, histologically compatible with glioblastoma  COMMENT:  Sections show a tumor exhibiting marked vascular proliferation with associated thrombosis and extensive necrosis.  There are multiple serpentine pseudopalisading foci of necrosis with peripheral pseudopalisading of tumor cells.  Overall, the cells are large and markedly pleomorphic with variably enlarged irregular hyperchromatic angulated nuclei.  Multifocally the tumor shows spindled cells with similar appearing nuclei within a fibrotic stroma.  Mitotic figures are readily identified.  The periphery of the tumor shows marked tumor necrosis with only residual vessels showing luminal thrombi.  The frozen section is comprised predominantly of this material with scant tumor evident on the deeper permanent sections.  These histologic features are consistent with a high-grade glioma/glioblastoma.  The specimen will be forwarded for molecular testing, and these results will be issued within a subsequent addendum to this report.  Case is  reviewed by Dr. Macie who concurs with the interpretation.  Diagnosis conveyed to Drs. Debby, Mesner and Elsner by Dr. Reed via Epic messaging on 12/21/2023 at 10:45 a.m.  A1. BRAIN TUMOR, RIGHT FRONTAL, FROZEN SECTION:        Predominantly fibrin, necrosis and inflammatory cells.  Recommend more diagnostic material.       Rapid intraoperative consult diagnosis called to Dr. Debby by Dr. Macie at 1508 on 12/18/2023.   GROSS DESCRIPTION:  Specimen A: Received fresh for rapid intraoperative consult evaluation by frozen section is a 0.5 x 0.5 x 0.3 cm aggregate of dark red soft tissue/material, entirely submitted in 1 block for frozen section.  Specimen B: Received fresh is a 3.6 x 3.2 x 1.6 cm soft to firm and focally friable tissue with pink-gray to dark red cut surfaces. Representative sections are submitted in 5 blocks.  SW 12/19/2023  Final Diagnosis performed by Prentice Reed, MD.   Assessment/Plan Frontal glioblastoma [facility])  We appreciate the opportunity to participate in the care of Nicholas Stevens.   We had an extensive conversation with him and his wife regarding pathology, prognosis, and available treatment pathways for glioblastoma.  We are encouraged by his good quality resection, good functional status.  We ultimately recommended proceeding with course of intensity modulated radiation therapy and concurrent daily Temozolomide .  Radiation will be administered Mon-Fri over 6 weeks, Temodar  will be dosed at 75mg /m2 to be given daily over 42 days.  We reviewed side effects of temodar , including fatigue, nausea/vomiting, constipation, and cytopenias.  Informed consent was verbally obtained at bedside to proceed with oral chemotherapy.  Chemotherapy should be held for the following:  ANC less than 1,000  Platelets less than 100,000  LFT or creatinine greater than 2x ULN  If clinical concerns/contraindications develop  Every 2 weeks during radiation, labs  will be checked accompanied by a clinical evaluation in the brain tumor clinic.  May discontinue post-op Keppra  prophylaxis given no seizures.  We also discussed and patient consented for additional tumor profiling and sequencing through [facility].  Advanced tumor profiling could help identify actionable mutation for targeted therapy and lead to direct clinical benefit.     Screening for potential clinical trials was performed and discussed using eligibility criteria for active protocols at [facility], loco-regional tertiary centers, as well as national database available on [email or web address].    a candidate for a research protocol at this time due to patient preference.   We spent twenty additional minutes teaching regarding the natural history, biology, and historical experience in the treatment of brain tumors. We then discussed in detail the current recommendations for therapy focusing on the mode of administration, mechanism of  action, anticipated toxicities, and quality of life issues associated with this plan. We also provided teaching sheets for the patient to take home as an additional resource.  All questions were answered. The patient knows to call the clinic with any problems, questions or concerns. No barriers to learning were detected.  The total time spent in the encounter was 60 minutes and more than 50% was on counseling and review of test results   Arthea MARLA Manns, MD Medical Director of Neuro-Oncology [facility] at [place] 01/08/24 4:38 PM

## 2024-01-08 NOTE — Telephone Encounter (Signed)
 Called patient and patient's spouse to schedule patient for a consultation w. Dr. Mitzi Hansen. No answer, LVM for a return call.

## 2024-01-09 NOTE — Addendum Note (Signed)
 Addended by: Henreitta Leber on: 01/09/2024 10:02 AM   Modules accepted: Orders

## 2024-01-11 ENCOUNTER — Ambulatory Visit: Payer: Commercial Managed Care - PPO | Admitting: Physical Therapy

## 2024-01-11 ENCOUNTER — Encounter: Payer: Self-pay | Admitting: Radiation Oncology

## 2024-01-11 ENCOUNTER — Other Ambulatory Visit: Payer: Self-pay | Admitting: Internal Medicine

## 2024-01-11 ENCOUNTER — Encounter: Payer: Self-pay | Admitting: Physical Therapy

## 2024-01-11 ENCOUNTER — Telehealth: Payer: Self-pay | Admitting: Pharmacist

## 2024-01-11 ENCOUNTER — Ambulatory Visit
Admission: RE | Admit: 2024-01-11 | Discharge: 2024-01-11 | Disposition: A | Discharge status: Home / Self Care | Payer: Commercial Managed Care - PPO | Source: Ambulatory Visit | Attending: Radiation Oncology | Admitting: Radiation Oncology

## 2024-01-11 ENCOUNTER — Inpatient Hospital Stay: Payer: Commercial Managed Care - PPO | Admitting: Licensed Clinical Social Worker

## 2024-01-11 ENCOUNTER — Ambulatory Visit: Payer: Commercial Managed Care - PPO | Admitting: Occupational Therapy

## 2024-01-11 VITALS — BP 162/99 | HR 85 | Temp 97.8°F | Resp 18 | Ht 74.0 in | Wt 270.2 lb

## 2024-01-11 VITALS — BP 138/87 | HR 72

## 2024-01-11 DIAGNOSIS — C711 Malignant neoplasm of frontal lobe: Secondary | ICD-10-CM | POA: Diagnosis present

## 2024-01-11 DIAGNOSIS — R2689 Other abnormalities of gait and mobility: Secondary | ICD-10-CM

## 2024-01-11 DIAGNOSIS — R29818 Other symptoms and signs involving the nervous system: Secondary | ICD-10-CM

## 2024-01-11 DIAGNOSIS — I69252 Hemiplegia and hemiparesis following other nontraumatic intracranial hemorrhage affecting left dominant side: Secondary | ICD-10-CM | POA: Diagnosis not present

## 2024-01-11 DIAGNOSIS — G4730 Sleep apnea, unspecified: Secondary | ICD-10-CM | POA: Insufficient documentation

## 2024-01-11 DIAGNOSIS — M6281 Muscle weakness (generalized): Secondary | ICD-10-CM

## 2024-01-11 DIAGNOSIS — I10 Essential (primary) hypertension: Secondary | ICD-10-CM | POA: Diagnosis not present

## 2024-01-11 DIAGNOSIS — M722 Plantar fascial fibromatosis: Secondary | ICD-10-CM | POA: Diagnosis not present

## 2024-01-11 DIAGNOSIS — Z79899 Other long term (current) drug therapy: Secondary | ICD-10-CM | POA: Diagnosis not present

## 2024-01-11 DIAGNOSIS — R29898 Other symptoms and signs involving the musculoskeletal system: Secondary | ICD-10-CM

## 2024-01-11 DIAGNOSIS — B350 Tinea barbae and tinea capitis: Secondary | ICD-10-CM | POA: Diagnosis not present

## 2024-01-11 DIAGNOSIS — R2681 Unsteadiness on feet: Secondary | ICD-10-CM

## 2024-01-11 DIAGNOSIS — M51369 Other intervertebral disc degeneration, lumbar region without mention of lumbar back pain or lower extremity pain: Secondary | ICD-10-CM | POA: Insufficient documentation

## 2024-01-11 DIAGNOSIS — F40240 Claustrophobia: Secondary | ICD-10-CM | POA: Diagnosis not present

## 2024-01-11 DIAGNOSIS — Z7901 Long term (current) use of anticoagulants: Secondary | ICD-10-CM | POA: Insufficient documentation

## 2024-01-11 DIAGNOSIS — D1809 Hemangioma of other sites: Secondary | ICD-10-CM | POA: Diagnosis not present

## 2024-01-11 DIAGNOSIS — I700 Atherosclerosis of aorta: Secondary | ICD-10-CM | POA: Diagnosis not present

## 2024-01-11 DIAGNOSIS — R278 Other lack of coordination: Secondary | ICD-10-CM

## 2024-01-11 MED ORDER — LORAZEPAM 1 MG PO TABS: 0.5000 mg | ORAL_TABLET | Freq: Three times a day (TID) | ORAL | 0 refills | Status: DC

## 2024-01-11 MED ORDER — ONDANSETRON HCL 8 MG PO TABS
8.0000 mg | ORAL_TABLET | Freq: Three times a day (TID) | ORAL | 1 refills | Status: DC | PRN
  Filled 2024-01-11: qty 30, 10d supply, fill #0
  Filled 2024-02-13: qty 30, 10d supply, fill #1

## 2024-01-11 MED ORDER — TEMOZOLOMIDE 180 MG PO CAPS: 75.0000 mg/m2/d | ORAL_CAPSULE | Freq: Every day | ORAL | 0 refills | Status: DC

## 2024-01-11 NOTE — Addendum Note (Signed)
Encounter addended by: Ronny Bacon, PA-C on: 01/11/2024 3:42 PM  Actions taken: Clinical Note Signed

## 2024-01-11 NOTE — Progress Notes (Signed)

## 2024-01-11 NOTE — Therapy (Signed)
OUTPATIENT PHYSICAL THERAPY NEURO TREATMENT NOTE   Patient Name: Nicholas Stevens MRN: [id number] DOB:[birth year], 53 y.o., male Today's Date: 01/11/2024   PCP: Jenelle Mages., MD REFERRING PROVIDER: Charlton Amor, PA-C  END OF SESSION:  PT End of Session - 01/11/24 1059     Visit Number 4    Number of Visits 13    Date for PT Re-Evaluation 02/02/24    Authorization Type UHC    Authorization Time Period 01-02-24 - 03-01-24    PT Start Time 1100    PT Stop Time 1145    PT Time Calculation (min) 45 min    Equipment Utilized During Treatment Gait belt    Activity Tolerance Patient tolerated treatment well    Behavior During Therapy WFL for tasks assessed/performed              Past Medical History:  Diagnosis Date   DDD (degenerative disc disease), lumbar    HNP (herniated nucleus pulposus)    Hypertension    Liver hemangioma    Morbid obesity (HCC)    OSA (obstructive sleep apnea)    no cpap used since weight loss   Overweight(278.02)    Plantar fasciitis of right foot    Sleep apnea    Tinea barbae    Past Surgical History:  Procedure Laterality Date   BREATH TEK H PYLORI N/A 08/20/2013   Procedure: BREATH TEK Richardo Priest;  Surgeon: Valarie Merino, MD;  Location: [facility];  Service: General;  Laterality: N/A;   COLONOSCOPY WITH PROPOFOL N/A 05/05/2022   Procedure: COLONOSCOPY WITH PROPOFOL;  Surgeon: Beverley Fiedler, MD;  Location: [facility];  Service: Gastroenterology;  Laterality: N/A;   CRANIOTOMY Right 12/18/2023   Procedure: Right Frontal Stereotactic Craniotomy for Resection of Tumor;  Surgeon: Bedelia Person, MD;  Location[facility];  Service: Neurosurgery;  Laterality: Right;   ESOPHAGOGASTRODUODENOSCOPY (EGD) WITH PROPOFOL N/A 03/15/2023   Procedure: ESOPHAGOGASTRODUODENOSCOPY (EGD) WITH PROPOFOL;  Surgeon: Iva Boop, MD;  Location: [facility];  Service: Gastroenterology;  Laterality: N/A;   LAPAROSCOPIC APPENDECTOMY  2007   Dr  Carolynne Edouard   LAPAROSCOPIC GASTRIC SLEEVE RESECTION N/A 10/08/2013   Procedure: LAPAROSCOPIC SLEEVE GASTRECTOMY;  Surgeon: Valarie Merino, MD;  Location: [facility];  Service: General;  Laterality: N/A;   LUMBAR LAMINECTOMY/DECOMPRESSION MICRODISCECTOMY N/A 09/16/2015   Procedure: MICRO LUMBAR DECOMPRESSION, MICRODISCECTOMY L5 - S1;  Surgeon: Jene Every, MD;  Location: [facility];  Service: Orthopedics;  Laterality: N/A;   POLYPECTOMY  05/05/2022   Procedure: POLYPECTOMY;  Surgeon: Beverley Fiedler, MD;  Location: [facility];  Service: Gastroenterology;;   ROTATOR CUFF REPAIR Right 2005   Dr Lajoyce Corners   Patient Active Problem List   Diagnosis Date Noted   Allergic drug rash 12/29/2023   DVT, lower extremity, distal (HCC) 12/29/2023   Frontal glioblastoma (HCC) 12/26/2023   Brain mass 12/15/2023   Pseudofolliculitis barbae 05/01/2023   Iron deficiency 04/06/2023   Abdominal pain 03/15/2023   Microcytosis 03/14/2023   RUQ pain 03/12/2023   Abnormal EKG 03/12/2023   Chest pain 03/12/2023   Adult acne 01/19/2023   Obesity (BMI 30-39.9) 01/19/2023   Benign neoplasm of cecum    Benign neoplasm of ascending colon    Benign neoplasm of transverse colon    Difficult airway for intubation 01/18/2022   Upper respiratory disease 01/31/2019   Cerumen impaction 07/30/2018   HTN (hypertension) 11/03/2017   Spinal stenosis of lumbar region 09/16/2015   Insomnia 04/15/2015  Well adult exam 11/04/2014   H/O gastric sleeve 10/08/2013   Hypogonadism, male 07/17/2013   Erectile dysfunction 03/22/2013   Concentration deficit 04/27/2011   Attention or concentration deficit 04/27/2011   TINEA BARBAE 03/04/2009   LIVER HEMANGIOMA 03/04/2009   OVERWEIGHT-BMI 42 03/04/2009   OSA on CPAP 03/04/2009   DEGENERATIVE DISC DISEASE, LUMBAR SPINE 03/04/2009    ONSET DATE: 12-15-23  REFERRING DIAG: C71.9 (ICD-10-CM) - Malignant neoplasm of brain, unspecified  THERAPY DIAG:  Muscle weakness (generalized)  Other  abnormalities of gait and mobility  Unsteadiness on feet  Rationale for Evaluation and Treatment: Rehabilitation  SUBJECTIVE:                                                                                                                                                                                             SUBJECTIVE STATEMENT: Patient reports he is doing well. No major changes. Wants to work on heel to toe balance. Reports being cleared to stop taking kepra; no history of seizures.   Per chart note:  53 yo male presenting 12/20 to ED with decreased response time while driving, incoordination, imbalance. Imaging showed necrotic and hemorrhagic mass in the superior right frontal lobe with features of glioblastoma with mass effect. S/p R craniotomy 12/23. PMH includes: obesity and OSA.   Pt accompanied by:  wife  - [name]  PERTINENT HISTORY: history of OSA, HTN, Tinea barbae, obesity s/p gastric sleeve, liver hemangioma who was admitted on 12/15/23 with reports of decrease in coordination, minimal use of LUE/LLE and; hospital admission 12-15-23 with transfer to inpatient rehab 12-26-23 - 12-31-23  PAIN:  Are you having pain? No  Vitals:   01/11/24 1106  BP: 138/87  Pulse: 72   Seated on LUE  PRECAUTIONS: Other: No driving; Fall  RED FLAGS: None   WEIGHT BEARING RESTRICTIONS: No  FALLS: Has patient fallen in last 6 months? No  LIVING ENVIRONMENT: Lives with: lives with their spouse Lives in: House/apartment Stairs: Yes: Internal: 12 steps; can reach both master bedroom on main level  - has not been up steps to 2nd level of home yet as only been home for 2 days Has following equipment at home: None  PLOF: Independent; pt retired from [employer] 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  DIAGNOSTIC FINDINGS: MRI brain done revealing necrotic, hemorrhagic mass in superior right frontal lobe with features of glioblastoma and mass effect with very early right uncal herniation.  COGNITION: Overall cognitive status: Within functional limits for tasks assessed and appears to have slightly delayed processing - see OT eval for details   SENSATION: WFL  COORDINATION: WFL's bil. LE's  POSTURE: No Significant postural limitations  LOWER EXTREMITY ROM:   WNL's bil. LE's   LOWER EXTREMITY MMT:  RLE WNL's  MMT Right Eval Left Eval  Hip flexion 5 4-  Hip extension 5 3-  Hip abduction  4  Hip adduction    Hip internal rotation    Hip external rotation    Knee flexion 5 4-  Knee extension 5 5  Ankle dorsiflexion 5 5  Ankle plantarflexion 5 5  Ankle inversion    Ankle eversion    (Blank rows = not tested)  BED MOBILITY:  Independent  TRANSFERS: Assistive device utilized: None  Sit to stand: Modified independence Stand to sit: Modified independence  RAMP: TBA  CURB: TBA  STAIRS: TBA   GAIT: Gait pattern: step through pattern and decreased arm swing- Left Distance walked: 100' Assistive device utilized: None Level of assistance: SBA Comments: mildly decreased Lt arm swing   FUNCTIONAL TESTS:  5 times sit to stand: 11.69 secs from chair without UE support Timed up and go (TUG): 10.87 secs without device 10 meter walk test: 11.68 secs without device = 2.81 ft/sec Berg Balance Scale: 54/56   PATIENT SURVEYS:  N/A due to dx of brain tumor                                                                                                                            TREATMENT DATE:01/08/24  Vitals:   01/11/24 1106  BP: 138/87  Pulse: 72   Seated on LUE  NMR: Tandem balance with front leg on  round side of BOSU with cross body reaching placing squigz up on mirror 1 x 8 bil (CGA) Tandem balance NBOS with cross body reaching placing squigz up on mirror 2 x 8 bil (CGA)  Tandem balance with front leg on round side of BOSU with same side body reaching placing squigz up on mirror high and low 2 x 8 bil (CGA) Tandem balance with 6lb med ball slams 3 x 10 reps (only a few minor stance corrections)  TherEx: Stride stance with 15lb med ball overhead press into Mar 28, 2025x 10 (CGA) Dead lift with surge tank 3 x 10 reps (reps 2-3 standing on airex for increased stability challenge) Sit to stand from low mat standing on airex 3 x 10 (set 2-3 added resistance arounds knees for quad bias, set 3 added 15lb kettle bell chess hold for increased resistance challenge)   PATIENT EDUCATION: Education details: Continue HEP Person educated: Patient and wife Education method: Programmer, multimedia, Facilities manager, Verbal cues, and Handouts Education comprehension: verbalized understanding and returned  demonstration  HOME EXERCISE PROGRAM: Access Code: [email or web address] URL: [email or web address] Date: 01/04/2024 Prepared by: Maebelle Munroe  Exercises - 2# weight used for SLR, knee flexion and clam shell and hip abduction  - Supine Active Straight Leg Raise  - 1 x daily - 7 x weekly - 3 sets - 10 reps - SIDELYING LEG LIFTS; ALSO DO CIRCLES CLOCKWISE & COUNTERCLOCKWISE  - 1 x daily - 7 x weekly - 3 sets - 10 reps - Prone Hip Extension  - 1 x daily - 7 x weekly - 3 sets - 10 reps - Clamshell  - 1 x daily - 7 x weekly - 3 sets - 10 reps - 5 sec hold - Prone Knee Flexion AROM  - 1 x daily - 7 x weekly - 3 sets - 10 reps - Single Leg Stance  - 1 x daily - 7 x weekly - 1 sets - 2-3 reps - 15 sec hold - Tandem Stance  - 1 x daily - 7 x weekly - 1 sets - 2 reps - 30 sec  hold - Romberg Stance Eyes Closed on Foam Pad  - 1 x daily - 5 x weekly - 3 sets - 30 hold - Narrow Stance with Eyes Closed and Head Rotation on  Foam Pad  - 1 x daily - 5 x weekly - 2 sets - 10 reps  GOALS: Goals reviewed with patient? Yes  SHORT TERM GOALS: SAME AS LTG'S AS ELOS = 4 WEEKS     LONG TERM GOALS: Target date: 02-02-24  Assess FGA and set goal as appropriate. Baseline:  Goal status: INITIAL  2.  Improve Berg score to 56/56 to demo improved static standing balance. Baseline: 54/56 on 01-02-24 Goal status: INITIAL  3.  Pt will amb. 10" nonstop on various surfaces including grass, pavement and indoor, without LOB and with no c/o fatigue, demonstrating Lt arm swing, with supervision.  Baseline:  Goal status: INITIAL  4.  Increase gait velocity to >/= 3.6 ft/sec without use of device for increased gait efficiency.  Baseline: 2.81 ft/sec (11.68 secs) Goal status: INITIAL  5.  Pt will jog 115' on flat, even surface independently without LOB and RPE </= 2/10.  Baseline:  Goal status: INITIAL  6.  Negotiate 4 steps using step over step sequence with supervision without use of hand rail to demo improved balance. Baseline:  Goal status: INITIAL  7.  Independent in HEP for LLE strengthening, balance and coordination.   Baseline:  Goal status: INITIAL  8.  Pt will improve composite score to above normal for age in order to demo improved balance.   Baseline: 64 (below normal)   Goal status: INITIAL  ASSESSMENT:  CLINICAL IMPRESSION:  Today's skilled session focused on tandem balance, balance on compliant surface, and functional strength work with goal of return to work. Patient tandem balance notably improved from prior sessions largely due to strong compliance with HEP. Pt highly motivated. Requires intermittent cueing to decrease lumbar engagement with dead lifts as well as pacing and amplitude of movements with balance+strength combo tasks. Pt tolerated session well, will continue per POC.    OBJECTIVE IMPAIRMENTS: Abnormal gait, decreased activity tolerance, decreased balance, decreased coordination, and  decreased strength.   ACTIVITY LIMITATIONS: carrying, lifting, squatting, stairs, and locomotion level  PARTICIPATION LIMITATIONS: cleaning, laundry, driving, shopping, community activity, and occupation  PERSONAL FACTORS: Profession and 1 comorbidity: s/p craniotomy due to brain tumor  are also affecting patient's functional outcome.   REHAB POTENTIAL:  Good  CLINICAL DECISION MAKING: Evolving/moderate complexity  EVALUATION COMPLEXITY: Moderate  PLAN:  PT FREQUENCY: 3x/week  PT DURATION: 4 weeks  PLANNED INTERVENTIONS: 97110-Therapeutic exercises, 97530- Therapeutic activity, 97112- Neuromuscular re-education, [phone]- Self Care, 62130- Gait training, [phone]- Aquatic Therapy, Patient/Family education, Balance training, and Stair training  PLAN FOR NEXT SESSION: work on LLE strengthening, elliptical, high level balance and strengthening, balance with EC/compliant surfaces, trial balance beam tasks potentially  Carmelia Bake, PT, DPT 01/11/2024, 12:26 PM

## 2024-01-11 NOTE — Therapy (Signed)
OUTPATIENT OCCUPATIONAL THERAPY NEURO Treatment  Patient Name: Nicholas Stevens MRN: [id number] DOB:[birth year], 53 y.o., male Today's Date: 01/11/2024  PCP: Tresa Garter, MD REFERRING PROVIDER: Charlton Amor, PA-C   END OF SESSION:  OT End of Session - 01/11/24 1105     Visit Number 2    Number of Visits 16    Date for OT Re-Evaluation 03/22/24    Authorization Type [employer]    OT Start Time 1018    OT Stop Time 1058    OT Time Calculation (min) 40 min    Activity Tolerance Patient tolerated treatment well    Behavior During Therapy WFL for tasks assessed/performed              Past Medical History:  Diagnosis Date   DDD (degenerative disc disease), lumbar    HNP (herniated nucleus pulposus)    Hypertension    Liver hemangioma    Morbid obesity (HCC)    OSA (obstructive sleep apnea)    no cpap used since weight loss   Overweight(278.02)    Plantar fasciitis of right foot    Sleep apnea    Tinea barbae    Past Surgical History:  Procedure Laterality Date   BREATH TEK H PYLORI N/A 08/20/2013   Procedure: BREATH TEK Richardo Priest;  Surgeon: Valarie Merino, MD;  Location: [facility];  Service: General;  Laterality: N/A;   COLONOSCOPY WITH PROPOFOL N/A 05/05/2022   Procedure: COLONOSCOPY WITH PROPOFOL;  Surgeon: Beverley Fiedler, MD;  Location: [facility];  Service: Gastroenterology;  Laterality: N/A;   CRANIOTOMY Right 12/18/2023   Procedure: Right Frontal Stereotactic Craniotomy for Resection of Tumor;  Surgeon: Bedelia Person, MD;  Location: [facility];  Service: Neurosurgery;  Laterality: Right;   ESOPHAGOGASTRODUODENOSCOPY (EGD) WITH PROPOFOL N/A 03/15/2023   Procedure: ESOPHAGOGASTRODUODENOSCOPY (EGD) WITH PROPOFOL;  Surgeon: Iva Boop, MD;  Location: [facility];  Service: Gastroenterology;  Laterality: N/A;   LAPAROSCOPIC APPENDECTOMY  2007   Dr Carolynne Edouard   LAPAROSCOPIC GASTRIC SLEEVE RESECTION N/A 10/08/2013   Procedure: LAPAROSCOPIC  SLEEVE GASTRECTOMY;  Surgeon: Valarie Merino, MD;  Location: [facility];  Service: General;  Laterality: N/A;   LUMBAR LAMINECTOMY/DECOMPRESSION MICRODISCECTOMY N/A 09/16/2015   Procedure: MICRO LUMBAR DECOMPRESSION, MICRODISCECTOMY L5 - S1;  Surgeon: Jene Every, MD;  Location: [facility];  Service: Orthopedics;  Laterality: N/A;   POLYPECTOMY  05/05/2022   Procedure: POLYPECTOMY;  Surgeon: Beverley Fiedler, MD;  Location: [facility];  Service: Gastroenterology;;   ROTATOR CUFF REPAIR Right 2005   Dr Lajoyce Corners   Patient Active Problem List   Diagnosis Date Noted   Allergic drug rash 12/29/2023   DVT, lower extremity, distal (HCC) 12/29/2023   Frontal glioblastoma (HCC) 12/26/2023   Brain mass 12/15/2023   Pseudofolliculitis barbae 05/01/2023   Iron deficiency 04/06/2023   Abdominal pain 03/15/2023   Microcytosis 03/14/2023   RUQ pain 03/12/2023   Abnormal EKG 03/12/2023   Chest pain 03/12/2023   Adult acne 01/19/2023   Obesity (BMI 30-39.9) 01/19/2023   Benign neoplasm of cecum    Benign neoplasm of ascending colon    Benign neoplasm of transverse colon    Difficult airway for intubation 01/18/2022   Upper respiratory disease 01/31/2019   Cerumen impaction 07/30/2018   HTN (hypertension) 11/03/2017   Spinal stenosis of lumbar region 09/16/2015   Insomnia 04/15/2015   Well adult exam 11/04/2014   H/O gastric sleeve 10/08/2013   Hypogonadism, male 07/17/2013   Erectile dysfunction  03/22/2013   Concentration deficit 04/27/2011   Attention or concentration deficit 04/27/2011   TINEA BARBAE 03/04/2009   LIVER HEMANGIOMA 03/04/2009   OVERWEIGHT-BMI 42 03/04/2009   OSA on CPAP 03/04/2009   DEGENERATIVE DISC DISEASE, LUMBAR SPINE 03/04/2009    ONSET DATE: 12/27/22 (referral date)  REFERRING DIAG: C71.9 (ICD-10-CM) - Malignant neoplasm of brain, unspecified   THERAPY DIAG:  Muscle weakness (generalized)  Other lack of coordination  Other symptoms and signs involving the nervous  system  Other symptoms and signs involving the musculoskeletal system  Unsteadiness on feet  Rationale for Evaluation and Treatment: Rehabilitation  SUBJECTIVE:   SUBJECTIVE STATEMENT: Pt reported feeling stronger every day.  Pt reported that pt's spouse is continuing to assist with medication management tasks at home.   Pt accompanied by: self and significant other - [name]   PERTINENT HISTORY:  PMH includes: HTN, obesity (gastric sleeve), OSA, lumbar DDD.   53 yo male presenting 12/20 with decreased response time while driving, incoordination, imbalance. Pt was [facility] to [facility] hospital from ED on 12/15/23;  Imaging showed necrotic and hemorrhagic mass in the superior right frontal lobe with features of glioblastoma with mass effect. underwent craniotomy for brain tumor (with features of gliobastoma per chart note) on 12/18/23;  pt was transferred to inpatient rehab on 12/26/23 and discharged home with wife on 12/31/23.     Per 12/27/23 acute OT evaluation: "history of OSA, HTN, obesity s/p gastric sleeve, liver hemangioma who was admitted on 12/15/23 with reports of decrease in coordination, minimal use of LUE/LLE and fall prompting treatment. MRI brain done revealing necrotic, hemorrhagic mass in superior right frontal lobe with features of glioblastoma and mass effect with very early right uncal herniation.  He underwent right frontal stereotactic  craniotomy for resection of frontal lobe tumor on 12/23 by Dr. Maisie Fus.  Post op has had improvement in LUE strength and follow up MRI showed good debulking of tumor with decrease in mass effect, collapse of lesion with region of enhancement  and 5 mm right to left shift. Pathology revealed high grade glioma grade 4 c/w glioblastoma."  PRECAUTIONS: Fall and Other: No driving  WEIGHT BEARING RESTRICTIONS: No  PAIN:  Are you having pain? No  FALLS: Has patient fallen in last 6 months? Yes. Number of falls 1   Pt reported he was working the  day of the incident and noticed his reaction time was slow.  He just thought his cold medincine was making him slow, but he ended up falling into the nightstand and wall and couldn't get himself up due to L UE/LE weakness prompting ED visit and hospitalization.  LIVING ENVIRONMENT: Lives with: lives with their family, lives with their spouse (high school sweethearts), and lives with youngest 78 yo son - 1 of 3 children) Lives in: House Stairs: Yes: Internal: 14  steps; can reach both and External: 1 threshold  steps; none  Master bedroom on main level  - has not been up steps to 2nd level of home yet as only been home for 2 days  Has following equipment: has built in seat in walk-in shower but not using it   PLOF: Independent - has a home gym, worked in [employer] and had driven to/from [employer] on the day of the incident  PATIENT GOALS: To improve my strength and balance.  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  HAND DOMINANCE: Right  ADLs: Overall ADLs: Pt has resumed all ADLs on his own, even standing in shower, managing fasteners although they may take a little longer.  IADLs: Shopping: Goes out with wife but unable to drive Light housekeeping: Wife isn't currently letting him Meal Prep: Wife is still doing meal prep although he was primary with this task previously Community mobility: Ind Medication management: Wife has it set up for him Financial management: Assisting wife, able to recall account numbers etc Handwriting: 100% legible - no change R UE unaffected  MOBILITY STATUS: Independent  POSTURE COMMENTS:  forward head Sitting balance: WNL  ACTIVITY TOLERANCE: Activity tolerance: limited  FUNCTIONAL  OUTCOME MEASURES: Lawton-Brody IADL Scale: 3/8    UPPER EXTREMITY ROM:    Active ROM Right eval Left eval  Shoulder flexion WNL Slight end range limitations  Shoulder abduction    Shoulder adduction    Shoulder extension    Shoulder internal rotation    Shoulder external rotation    Elbow flexion    Elbow extension    Wrist flexion    Wrist extension    Wrist ulnar deviation    Wrist radial deviation    Wrist pronation    Wrist supination    (Blank rows = not tested)  UPPER EXTREMITY MMT:     MMT Right eval Left eval  Shoulder flexion 5/5 4/5  Shoulder abduction    Shoulder adduction    Shoulder extension    Shoulder internal rotation    Shoulder external rotation    Middle trapezius    Lower trapezius    Elbow flexion    Elbow extension    Wrist flexion    Wrist extension    Wrist ulnar deviation    Wrist radial deviation    Wrist pronation    Wrist supination    (Blank rows = not tested)  HAND FUNCTION: Grip strength: Right: 60.4 - 1st attempt not included, 82.2, 86.1, 106.0  lbs; Left: 51.3, 54.8, 52.6 lbs Average: Right: 91.4 lbs Left: 52.9 lbs  COORDINATION: 9 Hole Peg test: Right: 19.15 sec; Left: 32.18 sec Box and Blocks:  Right 51 blocks, Left 37 blocks  SENSATION: WFL  EDEMA: NA  MUSCLE TONE: WFL  COGNITION: Overall cognitive status: Within functional limits for tasks assessed Recalled account number to bills independently VISION: Subjective report: Drops for glaucoma prevention/keep pressure down Baseline vision: Wears glasses for reading only Visual history:  NA  VISION ASSESSMENT: WFL  Patient has difficulty with following activities due to following visual impairments: NA  PERCEPTION: Not tested  PRAXIS: Not tested  OBSERVATIONS at eval: Pt ambulated with use of no AE and no loss of balance but with mildly decreased L arm swing and what OTR still noted as a slight favoring of R side with gaze. The pt is well kept and was  accompanied by his high school sweetheart/wife who did admit to overdoing it when helping him at times when pointed out by OTR.  TREATMENT DATE:   Therapeutic Activities OT initiated FM coordination HEP (handout provided, see pt instructions) - to improve L UE FM coordination, dexterity, proprioception, in-hand manipulation. Pt returned demonstration of each exercise: Picking up objects and placing in a container with slot Stacking towers of coins  Finger-to-palm then palm-to-finger translation of small items Shuffling cards Turning cards over 1 at a time Hold deck of cards in palm of hand and push off 1 card at a time from the top of the deck using only thumb Tear piece of tissue or paper, and rolling into small balls with fingertips Meditation balls - clockwise then counterclockwise Rolling pen between fingers and thumb, pen "twirl" (rotation), pen "shift" (translation)  Assembling/disassembling nuts/bolts  OT noted pt demo'd mild "shaking" ataxic movements of LUE during FM coordination tasks.  Self-Care Simulated medication sorting task using small pegs and various sizes of beads while reading handwritten instructions for simulated medication schedule - to improve ind for IADL medication management, to improve FM coordination and dexterity, in-hand manipulation, to improve sequencing and pattern recognition based on written instructions. Pt completed task with 100% accuracy.  OT recommended to pt to trial medication management at home and recommended to spouse to check accuracy after pt completes task to ensure accuracy and safety for medication management. Pt and spouse verbalized understanding.  PATIENT EDUCATION: Education details: see today's treatment above, brain and neuron anatomy and potential effect on coordination, increasing ind with daily tasks to improve  LUE function Person educated: Patient and Spouse Education method: Explanation, Demonstration, Verbal cues, and Handouts Education comprehension: verbalized understanding, returned demonstration, and needs further education  HOME EXERCISE PROGRAM: 01/11/24 - FM coordination HEP  GOALS: Goals reviewed with patient? Yes in general but will review specific goals at next visit  LONG TERM GOALS: Target date: 02/02/24  Patient will demonstrate initial LUE strength/coordination HEP with visual handouts only for proper execution to resume use of home gym including Bo-Flex etc. Baseline: New to outpt OT Goal status: INITIAL  2.  Patient will demonstrate at least 70+ lbs LUE grip strength as needed to open jars and other containers. Baseline: 52.9 lbs Goal status: INITIAL  3.  Patient will demo improved FM coordination as evidenced by completing nine-hole peg with use of LUE in 25 seconds or less. Baseline: 32.18 Goal status: INITIAL  4.  Pt will be able to place at least 42+ blocks using left hand with completion of Box and Blocks test. Baseline: 37 blocks Goal status: INITIAL  5.  Patient will demonstrate at least 95% accuracy with environmental visual scanning in distracting environment to assist with ADLs and IADLS including eventual driving considerations and work-related tasks as Hydrographic surveyor.   Baseline: New to outpt OT Goal status: INITIAL  6.  Pt will have adequate standing tolerance and balance to safely complete IADLS without supervision including preparing meals per PLOF. Baseline: Spouse is preparing meals. Brody-Lawton Scale 3/8 Goal status: INITIAL  7. Patient will demo improved BUE FM coordination as evidenced by independence with sorting medication on his own without dropping pills.  Baseline: Wife is assisting him Goal status: INITIAL  ASSESSMENT:  CLINICAL IMPRESSION: Pt tolerated tasks well. Pt would benefit from skilled OT services in the outpatient  setting to work on impairments as noted below to help pt return to PLOF as able.    PERFORMANCE DEFICITS: in functional skills including IADLs, coordination, dexterity, proprioception, ROM, strength, Fine motor control, Gross motor control, balance, endurance, vision, and UE functional use,  cognitive skills including attention, and psychosocial skills including coping strategies and routines and behaviors.   IMPAIRMENTS: are limiting patient from IADLs, work, leisure, and social participation.   CO-MORBIDITIES: may have co-morbidities  that affects occupational performance. Patient will benefit from skilled OT to address above impairments and improve overall function.  MODIFICATION OR ASSISTANCE TO COMPLETE EVALUATION: No modification of tasks or assist necessary to complete an evaluation.  OT OCCUPATIONAL PROFILE AND HISTORY: Problem focused assessment: Including review of records relating to presenting problem.  CLINICAL DECISION MAKING: LOW - limited treatment options, no task modification necessary  REHAB POTENTIAL: Excellent  EVALUATION COMPLEXITY: Low    PLAN:  OT FREQUENCY: 1-2x/week  OT DURATION: up to 6 weeks but planning for 4 weeks  PLANNED INTERVENTIONS: 97535 self care/ADL training, 82956 therapeutic exercise, 97530 therapeutic activity, 97112 neuromuscular re-education, functional mobility training, coping strategies training, and patient/family education  RECOMMENDED OTHER SERVICES: PT evaluation occurred date of eval also  CONSULTED AND AGREED WITH PLAN OF CARE: Patient and family member/caregiver  PLAN FOR NEXT SESSION:  Strengthening HEP - high level LUE shoulder stability (green or blue) Body blade Coordination activities - progress HEP as tolerated Scanning with distractions/Reaction times Standing balance and activity tolerance for cooking   Wynetta Emery, OT 01/11/2024, 11:21 AM

## 2024-01-11 NOTE — Telephone Encounter (Signed)
Oral Oncology Pharmacist Encounter  Received new prescription for Temodar (temozolomide) for the treatment of glioblastoma in conjunction with radiation, planned duration 42 days.  CBC and BMP from 12/28/23 as well as CBC w/ Diff and CMP from 12/27/23 assessed, no relevant lab abnormalities requiring baseline dose adjustment required at this time. Prescription dose and frequency assessed for appropriateness.  Current medication list in Epic reviewed, no relevant/significant DDIs with Temodar identified.  Evaluated chart and no patient barriers to medication adherence noted.   Patient agreement for treatment documented in MD note on 01/08/24.  Prescription has been e-scribed to the [facility] for benefits analysis and approval.  Oral Oncology Clinic will continue to follow for insurance authorization, copayment issues, initial counseling and start date.  Sherry Ruffing, PharmD, BCPS, BCOP Hematology/Oncology Clinical Pharmacist [facility] Oral Chemotherapy Navigation Clinics [phone] 01/11/2024 4:15 PM

## 2024-01-11 NOTE — Progress Notes (Signed)
Location/Histology of Brain Tumor: Glioblastoma Right Frontal Lobe  Patient presented with symptoms of right sided headache, difficulty with navigating his home and slow decision making.  CT imaging was obtained and showed a hemorrhagic area in the right frontal lobe measuring 2.7 cm.    Past or anticipated interventions, if any, per neurosurgery:  Dr. Maisie Fus -Right Frontal Craniotomy 12/18/2023    Past or anticipated interventions, if any, per medical oncology:  Dr. Barbaraann Cao 01/08/2024 -We ultimately recommended proceeding with course of intensity modulated radiation therapy and concurrent daily Temozolomide. Radiation will be administered Mon-Fri over 6 weeks, Temodar will be dosed at 75mg /m2 to be given daily over 42 days.    Dose of Decadron, if applicable: None  Recent neurologic symptoms, if any:  Seizures: None Headaches: None Nausea: No Dizziness/ataxia: No Difficulty with hand coordination: No  Focal numbness/weakness: He reports some weakness in his left arm and leg. Visual deficits/changes: No Confusion/Memory deficits: No   SAFETY ISSUES: Prior radiation? No Pacemaker/ICD? No Possible current pregnancy? N/a Is the patient on methotrexate? No  Additional Complaints / other details:

## 2024-01-11 NOTE — Patient Instructions (Addendum)
8. Assembling/disassembling nuts/bolts

## 2024-01-12 ENCOUNTER — Other Ambulatory Visit: Payer: Self-pay

## 2024-01-12 ENCOUNTER — Ambulatory Visit: Payer: Commercial Managed Care - PPO | Admitting: Physical Therapy

## 2024-01-12 ENCOUNTER — Encounter: Payer: Self-pay | Admitting: Physical Therapy

## 2024-01-12 ENCOUNTER — Telehealth: Payer: Self-pay | Admitting: *Deleted

## 2024-01-12 ENCOUNTER — Other Ambulatory Visit (HOSPITAL_COMMUNITY): Payer: Self-pay

## 2024-01-12 ENCOUNTER — Ambulatory Visit (HOSPITAL_COMMUNITY)
Admission: RE | Admit: 2024-01-12 | Discharge: 2024-01-12 | Disposition: A | Discharge status: Home / Self Care | Payer: Commercial Managed Care - PPO | Source: Ambulatory Visit | Attending: Radiation Oncology | Admitting: Radiation Oncology

## 2024-01-12 ENCOUNTER — Ambulatory Visit: Payer: Commercial Managed Care - PPO | Admitting: Occupational Therapy

## 2024-01-12 ENCOUNTER — Telehealth: Payer: Self-pay | Admitting: Pharmacy Technician

## 2024-01-12 VITALS — BP 138/95 | HR 104

## 2024-01-12 DIAGNOSIS — C711 Malignant neoplasm of frontal lobe: Secondary | ICD-10-CM | POA: Diagnosis present

## 2024-01-12 DIAGNOSIS — R2681 Unsteadiness on feet: Secondary | ICD-10-CM

## 2024-01-12 DIAGNOSIS — R278 Other lack of coordination: Secondary | ICD-10-CM

## 2024-01-12 DIAGNOSIS — R29818 Other symptoms and signs involving the nervous system: Secondary | ICD-10-CM

## 2024-01-12 DIAGNOSIS — M6281 Muscle weakness (generalized): Secondary | ICD-10-CM

## 2024-01-12 DIAGNOSIS — I69252 Hemiplegia and hemiparesis following other nontraumatic intracranial hemorrhage affecting left dominant side: Secondary | ICD-10-CM | POA: Diagnosis not present

## 2024-01-12 DIAGNOSIS — R29898 Other symptoms and signs involving the musculoskeletal system: Secondary | ICD-10-CM

## 2024-01-12 MED ORDER — GADOBUTROL 1 MMOL/ML IV SOLN
8.0000 mL | Freq: Once | INTRAVENOUS | Status: AC | PRN
  Administered 2024-01-12: 8 mL via INTRAVENOUS

## 2024-01-12 NOTE — Telephone Encounter (Signed)
Oral Oncology Patient Advocate Encounter   Received notification that prior authorization for temozolomide is required.   PA submitted on 01/12/24 Key [id number] Status is pending     Jinger Neighbors, CPhT-Adv Oncology Pharmacy Patient Advocate [facility] Direct Number: [phone]  Fax: [phone]

## 2024-01-12 NOTE — Telephone Encounter (Signed)
CALLED PATIENT TO INFORM OF MRI FOR 01-12-24- ARRIVAL TIME- 6 PM @ [facility], NO RESTRICTIONS TO SCAN, PATIENT TO CHECK IN @ [facility] ED, PATIENT IS CLAUSTROPOBIC AND HE HAS BEEN PRESCRIBED MEDICATION THAT HE WILL PICK UP TODAY, PRIOR TO HAVING SCAN, SPOKE WITH PATIENT'S WIFE- [name] AND SHE IS AWARE OF THIS SCAN AND THE INSTRUCTIONS

## 2024-01-12 NOTE — Therapy (Signed)
OUTPATIENT PHYSICAL THERAPY NEURO TREATMENT NOTE   Patient Name: Nicholas Stevens MRN: [id number] DOB:1971-03-18, 53 y.o., male Today's Date: 01/12/2024   PCP: Jenelle Mages., MD REFERRING PROVIDER: Charlton Amor, PA-C  END OF SESSION:  PT End of Session - 01/12/24 1101     Visit Number 5    Number of Visits 13    Date for PT Re-Evaluation 02/02/24    Authorization Type [employer]    Authorization Time Period 01-02-24 - 03-01-24    PT Start Time 1101    PT Stop Time 1142    PT Time Calculation (min) 41 min    Equipment Utilized During Treatment Gait belt    Activity Tolerance Patient tolerated treatment well   limited by high BP   Behavior During Therapy WFL for tasks assessed/performed              Past Medical History:  Diagnosis Date   DDD (degenerative disc disease), lumbar    HNP (herniated nucleus pulposus)    Hypertension    Liver hemangioma    Morbid obesity (HCC)    OSA (obstructive sleep apnea)    no cpap used since weight loss   Overweight(278.02)    Plantar fasciitis of right foot    Sleep apnea    Tinea barbae    Past Surgical History:  Procedure Laterality Date   BREATH TEK H PYLORI N/A 08/20/2013   Procedure: BREATH TEK Richardo Priest;  Surgeon: Valarie Merino, MD;  Location: [facility];  Service: General;  Laterality: N/A;   COLONOSCOPY WITH PROPOFOL N/A 05/05/2022   Procedure: COLONOSCOPY WITH PROPOFOL;  Surgeon: Beverley Fiedler, MD;  Location: [facility];  Service: Gastroenterology;  Laterality: N/A;   CRANIOTOMY Right 12/18/2023   Procedure: Right Frontal Stereotactic Craniotomy for Resection of Tumor;  Surgeon: Bedelia Person, MD;  Location: [facility];  Service: Neurosurgery;  Laterality: Right;   ESOPHAGOGASTRODUODENOSCOPY (EGD) WITH PROPOFOL N/A 03/15/2023   Procedure: ESOPHAGOGASTRODUODENOSCOPY (EGD) WITH PROPOFOL;  Surgeon: Iva Boop, MD;  Location: [facility];  Service: Gastroenterology;  Laterality: N/A;   LAPAROSCOPIC  APPENDECTOMY  2007   Dr Carolynne Edouard   LAPAROSCOPIC GASTRIC SLEEVE RESECTION N/A 10/08/2013   Procedure: LAPAROSCOPIC SLEEVE GASTRECTOMY;  Surgeon: Valarie Merino, MD;  Location: [facility];  Service: General;  Laterality: N/A;   LUMBAR LAMINECTOMY/DECOMPRESSION MICRODISCECTOMY N/A 09/16/2015   Procedure: MICRO LUMBAR DECOMPRESSION, MICRODISCECTOMY L5 - S1;  Surgeon: Jene Every, MD;  Location: [facility];  Service: Orthopedics;  Laterality: N/A;   POLYPECTOMY  05/05/2022   Procedure: POLYPECTOMY;  Surgeon: Beverley Fiedler, MD;  Location: [facility];  Service: Gastroenterology;;   ROTATOR CUFF REPAIR Right 2005   Dr Lajoyce Corners   Patient Active Problem List   Diagnosis Date Noted   Allergic drug rash 12/29/2023   DVT, lower extremity, distal (HCC) 12/29/2023   Frontal glioblastoma (HCC) 12/26/2023   Brain mass 12/15/2023   Pseudofolliculitis barbae 05/01/2023   Iron deficiency 04/06/2023   Abdominal pain 03/15/2023   Microcytosis 03/14/2023   RUQ pain 03/12/2023   Abnormal EKG 03/12/2023   Chest pain 03/12/2023   Adult acne 01/19/2023   Obesity (BMI 30-39.9) 01/19/2023   Benign neoplasm of cecum    Benign neoplasm of ascending colon    Benign neoplasm of transverse colon    Difficult airway for intubation 01/18/2022   Upper respiratory disease 01/31/2019   Cerumen impaction 07/30/2018   HTN (hypertension) 11/03/2017   Spinal stenosis of lumbar region 09/16/2015  Insomnia 04/15/2015   Well adult exam 11/04/2014   H/O gastric sleeve 10/08/2013   Hypogonadism, male 07/17/2013   Erectile dysfunction 03/22/2013   Concentration deficit 04/27/2011   Attention or concentration deficit 04/27/2011   TINEA BARBAE 03/04/2009   LIVER HEMANGIOMA 03/04/2009   OVERWEIGHT-BMI 42 03/04/2009   OSA on CPAP 03/04/2009   DEGENERATIVE DISC DISEASE, LUMBAR SPINE 03/04/2009    ONSET DATE: 12-15-23  REFERRING DIAG: C71.9 (ICD-10-CM) - Malignant neoplasm of brain, unspecified  THERAPY DIAG:  Muscle weakness  (generalized)  Other symptoms and signs involving the nervous system  Unsteadiness on feet  Rationale for Evaluation and Treatment: Rehabilitation  SUBJECTIVE:                                                                                                                                                                                             SUBJECTIVE STATEMENT: Reports legs felt a little fatigued after yesterday for PT. Had a good OT session. Will be getting an MRI later this evening.  Per chart note:  53 yo male presenting 12/20 to ED with decreased response time while driving, incoordination, imbalance. Imaging showed necrotic and hemorrhagic mass in the superior right frontal lobe with features of glioblastoma with mass effect. S/p R craniotomy 12/23. PMH includes: obesity and OSA.   Pt accompanied by:  wife  - Nicholas Stevens  PERTINENT HISTORY: history of OSA, HTN, Tinea barbae, obesity s/p gastric sleeve, liver hemangioma who was admitted on 12/15/23 with reports of decrease in coordination, minimal use of LUE/LLE and; hospital admission 12-15-23 with transfer to inpatient rehab 12-26-23 - 12-31-23  PAIN:  Are you having pain? No  Vitals:   01/12/24 1128 01/12/24 1131  BP: (!) 162/101 (!) 138/95  Pulse: (!) 117 (!) 104    Seated on LUE  PRECAUTIONS: Other: No driving; Fall  RED FLAGS: None   WEIGHT BEARING RESTRICTIONS: No  FALLS: Has patient fallen in last 6 months? No  LIVING ENVIRONMENT: Lives with: lives with their spouse Lives in: House/apartment Stairs: Yes: Internal: 12 steps; can reach both master bedroom on main level  - has not been up steps to 2nd level of home yet as only been home for 2 days Has following equipment at home: None  PLOF: Independent; pt retired from [employer] 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  DIAGNOSTIC FINDINGS: MRI brain done revealing necrotic, hemorrhagic mass in superior right frontal lobe with features of glioblastoma and mass effect with very early right uncal herniation.  COGNITION: Overall cognitive status: Within functional limits for tasks assessed and appears to have slightly delayed processing - see OT eval for details   SENSATION: WFL  COORDINATION: WFL's bil. LE's  POSTURE: No Significant postural limitations  LOWER EXTREMITY ROM:   WNL's bil. LE's   LOWER EXTREMITY MMT:  RLE WNL's  MMT Right Eval Left Eval  Hip flexion 5 4-  Hip extension 5 3-  Hip abduction  4  Hip adduction    Hip internal rotation    Hip external rotation    Knee flexion 5 4-  Knee extension 5 5  Ankle dorsiflexion 5 5  Ankle plantarflexion 5 5  Ankle inversion    Ankle eversion    (Blank rows = not tested)  BED MOBILITY:  Independent  TRANSFERS: Assistive device utilized: None  Sit to stand: Modified independence Stand to sit: Modified independence  RAMP: TBA  CURB: TBA  STAIRS: TBA   GAIT: Gait pattern: step through pattern and decreased arm swing- Left Distance walked: 100' Assistive device utilized: None Level of assistance: SBA Comments: mildly decreased Lt arm swing   FUNCTIONAL TESTS:  5 times sit to stand: 11.69 secs from chair without UE support Timed up and go (TUG): 10.87 secs without device 10 meter walk test: 11.68 secs without device = 2.81 ft/sec Berg Balance Scale: 54/56   PATIENT SURVEYS:  N/A due to dx of brain tumor                                                                                                                            TREATMENT DATE:01/12/24  Therapeutic Activity:   Vitals:   01/12/24 1104 01/12/24 1128  01/12/24 1131  BP: (!) 146/93 (!) 162/101 (!) 138/95  Pulse: 83 (!) 117 (!) 104   Seated on LUE  Pt asymptomatic with BP after activity. BP did decr after seated rest break. Pt reports taking his BP medication daily as prescribed. Discussed made need further follow-up with PCP regarding BP if it remains elevated.   NMR: On blue foam beam: tandem gait down and back x1 rep slowly with 5-6 second holds in each position, then addition of head turns with tandem gait down and back x2 reps, pt needing intermittent UE support for balance On rockerboard in A/P direction: Alternating forward step ups with contralateral leg lift into a march 10 reps each leg, pt more challenged with SLS on RLE, pt not able to hold for more than a few seconds EC static stance 2 x 30 seconds, while rocking board 30 seconds, improved with incr reps  SLS holding 6# medicine ball and throwing it to floor and catching it, performed 3 sets of 6-8 reps each side, pt able to perform  more consecutive reps on LLE for balance  TherEx: Elliptical at gear 1.5 > 2 minutes for 4 minutes total, 2 minutes forwards and 2 minutes backwards. Pt reporting RPE after at 6/10. Needing seated rest break afterwards.  Single leg bridge 2 x 10 reps with LLE, 10 reps with RLE, pt with noticeable muscle fatigue in LLE, cues for breathing throughout and for holding bridge for a few seconds before lowering. Added to HEP Sidelying L hip ABDuction rainbow heel taps 2 x 10 reps, pt fatigued with this, discussed can also add these to HEP   PATIENT EDUCATION: Education details: additions of single leg bridging and sidelying hip ABD rainbow heel taps to HEP, will continue to monitor BP and may have to reach out to PCP if it remains elevated  Person educated: Patient and wife Education method: Explanation, Demonstration, Verbal cues, and Handouts Education comprehension: verbalized understanding and returned demonstration  HOME EXERCISE PROGRAM: Access  Code: [email or web address] URL: [email or web address] Date: 01/04/2024 Prepared by: Maebelle Munroe  Exercises - 2# weight used for SLR, knee flexion and clam shell and hip abduction  - Supine Active Straight Leg Raise  - 1 x daily - 7 x weekly - 3 sets - 10 reps - SIDELYING LEG LIFTS; ALSO DO CIRCLES CLOCKWISE & COUNTERCLOCKWISE  - 1 x daily - 7 x weekly - 3 sets - 10 reps - Prone Hip Extension  - 1 x daily - 7 x weekly - 3 sets - 10 reps - Clamshell  - 1 x daily - 7 x weekly - 3 sets - 10 reps - 5 sec hold - Prone Knee Flexion AROM  - 1 x daily - 7 x weekly - 3 sets - 10 reps - Single Leg Stance  - 1 x daily - 7 x weekly - 1 sets - 2-3 reps - 15 sec hold - Tandem Stance  - 1 x daily - 7 x weekly - 1 sets - 2 reps - 30 sec  hold - Romberg Stance Eyes Closed on Foam Pad  - 1 x daily - 5 x weekly - 3 sets - 30 hold - Narrow Stance with Eyes Closed and Head Rotation on Foam Pad  - 1 x daily - 5 x weekly - 2 sets - 10 reps - Single Leg Bridge  - 1 x daily - 5 x weekly - 2 sets - 10 reps  GOALS: Goals reviewed with patient? Yes  SHORT TERM GOALS: SAME AS LTG'S AS ELOS = 4 WEEKS     LONG TERM GOALS: Target date: 02-02-24  Assess FGA and set goal as appropriate. Baseline:  Goal status: INITIAL  2.  Improve Berg score to 56/56 to demo improved static standing balance. Baseline: 54/56 on 01-02-24 Goal status: INITIAL  3.  Pt will amb. 10" nonstop on various surfaces including grass, pavement and indoor, without LOB and with no c/o fatigue, demonstrating Lt arm swing, with supervision.  Baseline:  Goal status: INITIAL  4.  Increase gait velocity to >/= 3.6 ft/sec without use of device for increased gait efficiency.  Baseline: 2.81 ft/sec (11.68 secs) Goal status: INITIAL  5.  Pt will jog 115' on flat, even surface independently without LOB and RPE </= 2/10.  Baseline:  Goal status: INITIAL  6.  Negotiate 4 steps using step over step sequence with supervision without use of hand rail  to demo improved balance. Baseline:  Goal status: INITIAL  7.  Independent in HEP for LLE strengthening, balance and coordination.  Baseline:  Goal status: INITIAL  8.  Pt will improve composite score to above normal for age in order to demo improved balance.   Baseline: 64 (below normal)   Goal status: INITIAL  ASSESSMENT:  CLINICAL IMPRESSION: Pt's diastolic BP elevated at start of session, but still WNL to participate in therapy. BP assessed again after activity and was elevated, BP did come back down after seated rest break.Will continue to monitor after activity.  Pt more challenged with SLS tasks on RLE due to a hx of a past ankle injury. Pt's LLE fatigued easily with hip ABD and single leg bridging exercises.Pt tolerated session well, will continue per POC.    OBJECTIVE IMPAIRMENTS: Abnormal gait, decreased activity tolerance, decreased balance, decreased coordination, and decreased strength.   ACTIVITY LIMITATIONS: carrying, lifting, squatting, stairs, and locomotion level  PARTICIPATION LIMITATIONS: cleaning, laundry, driving, shopping, community activity, and occupation  PERSONAL FACTORS: Profession and 1 comorbidity: s/p craniotomy due to brain tumor  are also affecting patient's functional outcome.   REHAB POTENTIAL: Good  CLINICAL DECISION MAKING: Evolving/moderate complexity  EVALUATION COMPLEXITY: Moderate  PLAN:  PT FREQUENCY: 3x/week  PT DURATION: 4 weeks  PLANNED INTERVENTIONS: 97110-Therapeutic exercises, 97530- Therapeutic activity, 97112- Neuromuscular re-education, [phone]- Self Care, [id number]- Gait training, [phone]- Aquatic Therapy, Patient/Family education, Balance training, and Stair training  PLAN FOR NEXT SESSION: CHECK BP  work on LLE strengthening, elliptical, high level balance and strengthening, balance with EC/compliant surfaces, trial balance beam tasks potentially, maybe make leg strengthening exercises at home easier?   Drake Leach, PT,  DPT 01/12/2024, 11:56 AM

## 2024-01-12 NOTE — Telephone Encounter (Signed)
CALLED PATIENT TO INFORM OF MRI FOR 01-12-24- ARRIVAL TIME- 6 PM @ [facility], NO RESTRICTIONS TO SCAN, LVM FOR A RETURN CALL

## 2024-01-12 NOTE — Telephone Encounter (Signed)
Oral Oncology Patient Advocate Encounter  Prior Authorization for temozolomide has been approved.    PA# [id number] Effective dates: 01/12/24 through 01/11/25  Patient is required to fill at [facility].    Jinger Neighbors, CPhT-Adv Oncology Pharmacy Patient Advocate [facility] Direct Number: [phone]  Fax: [phone]

## 2024-01-12 NOTE — Therapy (Signed)
OUTPATIENT OCCUPATIONAL THERAPY NEURO Treatment  Patient Name: Nicholas Stevens MRN: [id number] DOB:Feb 15, 1971, 53 y.o., male Today's Date: 01/12/2024  PCP: Tresa Garter, MD REFERRING PROVIDER: Charlton Amor, PA-C   END OF SESSION:  OT End of Session - 01/12/24 1208     Visit Number 3    Number of Visits 16    Date for OT Re-Evaluation 03/22/24    Authorization Type [employer]    OT Start Time 1015    OT Stop Time 1056    OT Time Calculation (min) 41 min    Activity Tolerance Patient tolerated treatment well    Behavior During Therapy WFL for tasks assessed/performed               Past Medical History:  Diagnosis Date   DDD (degenerative disc disease), lumbar    HNP (herniated nucleus pulposus)    Hypertension    Liver hemangioma    Morbid obesity (HCC)    OSA (obstructive sleep apnea)    no cpap used since weight loss   Overweight(278.02)    Plantar fasciitis of right foot    Sleep apnea    Tinea barbae    Past Surgical History:  Procedure Laterality Date   BREATH TEK H PYLORI N/A 08/20/2013   Procedure: BREATH TEK Richardo Priest;  Surgeon: Valarie Merino, MD;  Location: [facility];  Service: General;  Laterality: N/A;   COLONOSCOPY WITH PROPOFOL N/A 05/05/2022   Procedure: COLONOSCOPY WITH PROPOFOL;  Surgeon: Beverley Fiedler, MD;  Location: [facility];  Service: Gastroenterology;  Laterality: N/A;   CRANIOTOMY Right 12/18/2023   Procedure: Right Frontal Stereotactic Craniotomy for Resection of Tumor;  Surgeon: Bedelia Person, MD;  Location: [facility] OR;  Service: Neurosurgery;  Laterality: Right;   ESOPHAGOGASTRODUODENOSCOPY (EGD) WITH PROPOFOL N/A 03/15/2023   Procedure: ESOPHAGOGASTRODUODENOSCOPY (EGD) WITH PROPOFOL;  Surgeon: Iva Boop, MD;  Location: [facility];  Service: Gastroenterology;  Laterality: N/A;   LAPAROSCOPIC APPENDECTOMY  2007   Dr Carolynne Edouard   LAPAROSCOPIC GASTRIC SLEEVE RESECTION N/A 10/08/2013   Procedure: LAPAROSCOPIC  SLEEVE GASTRECTOMY;  Surgeon: Valarie Merino, MD;  Location: [facility];  Service: General;  Laterality: N/A;   LUMBAR LAMINECTOMY/DECOMPRESSION MICRODISCECTOMY N/A 09/16/2015   Procedure: MICRO LUMBAR DECOMPRESSION, MICRODISCECTOMY L5 - S1;  Surgeon: Jene Every, MD;  Location: [facility];  Service: Orthopedics;  Laterality: N/A;   POLYPECTOMY  05/05/2022   Procedure: POLYPECTOMY;  Surgeon: Beverley Fiedler, MD;  Location: [facility];  Service: Gastroenterology;;   ROTATOR CUFF REPAIR Right 2005   Dr Lajoyce Corners   Patient Active Problem List   Diagnosis Date Noted   Allergic drug rash 12/29/2023   DVT, lower extremity, distal (HCC) 12/29/2023   Frontal glioblastoma (HCC) 12/26/2023   Brain mass 12/15/2023   Pseudofolliculitis barbae 05/01/2023   Iron deficiency 04/06/2023   Abdominal pain 03/15/2023   Microcytosis 03/14/2023   RUQ pain 03/12/2023   Abnormal EKG 03/12/2023   Chest pain 03/12/2023   Adult acne 01/19/2023   Obesity (BMI 30-39.9) 01/19/2023   Benign neoplasm of cecum    Benign neoplasm of ascending colon    Benign neoplasm of transverse colon    Difficult airway for intubation 01/18/2022   Upper respiratory disease 01/31/2019   Cerumen impaction 07/30/2018   HTN (hypertension) 11/03/2017   Spinal stenosis of lumbar region 09/16/2015   Insomnia 04/15/2015   Well adult exam 11/04/2014   H/O gastric sleeve 10/08/2013   Hypogonadism, male 07/17/2013   Erectile  dysfunction 03/22/2013   Concentration deficit 04/27/2011   Attention or concentration deficit 04/27/2011   TINEA BARBAE 03/04/2009   LIVER HEMANGIOMA 03/04/2009   OVERWEIGHT-BMI 42 03/04/2009   OSA on CPAP 03/04/2009   DEGENERATIVE DISC DISEASE, LUMBAR SPINE 03/04/2009    ONSET DATE: 12/27/22 (referral date)  REFERRING DIAG: C71.9 (ICD-10-CM) - Malignant neoplasm of brain, unspecified   THERAPY DIAG:  Muscle weakness (generalized)  Other lack of coordination  Other symptoms and signs involving the nervous  system  Other symptoms and signs involving the musculoskeletal system  Unsteadiness on feet  Rationale for Evaluation and Treatment: Rehabilitation  SUBJECTIVE:   SUBJECTIVE STATEMENT: Pt reported things are "pretty good" and "progressing."  Pt reported standing balance is "getting better and better."   Pt accompanied by: self and significant other - [name]   PERTINENT HISTORY:  PMH includes: HTN, obesity (gastric sleeve), OSA, lumbar DDD.   53 yo male presenting 12/20 with decreased response time while driving, incoordination, imbalance. Pt was admitted to [facility] hospital from ED on 12/15/23;  Imaging showed necrotic and hemorrhagic mass in the superior right frontal lobe with features of glioblastoma with mass effect. underwent craniotomy for brain tumor (with features of gliobastoma per chart note) on 12/18/23;  pt was transferred to inpatient rehab on 12/26/23 and discharged home with wife on 12/31/23.     Per 12/27/23 acute OT evaluation: "history of OSA, HTN, obesity s/p gastric sleeve, liver hemangioma who was admitted on 12/15/23 with reports of decrease in coordination, minimal use of LUE/LLE and fall prompting treatment. MRI brain done revealing necrotic, hemorrhagic mass in superior right frontal lobe with features of glioblastoma and mass effect with very early right uncal herniation.  He underwent right frontal stereotactic  craniotomy for resection of frontal lobe tumor on 12/23 by Dr. Maisie Fus.  Post op has had improvement in LUE strength and follow up MRI showed good debulking of tumor with decrease in mass effect, collapse of lesion with region of enhancement  and 5 mm right to left shift. Pathology revealed high grade glioma grade 4 c/w glioblastoma."  PRECAUTIONS: Fall and Other: No driving  WEIGHT BEARING RESTRICTIONS: No  PAIN:  Are you having pain? No  FALLS: Has patient fallen in last 6 months? Yes. Number of falls 1   Pt reported he was working the day of the incident  and noticed his reaction time was slow.  He just thought his cold medincine was making him slow, but he ended up falling into the nightstand and wall and couldn't get himself up due to L UE/LE weakness prompting ED visit and hospitalization.  LIVING ENVIRONMENT: Lives with: lives with their family, lives with their spouse (high school sweethearts), and lives with youngest 51 yo son - 1 of 3 children) Lives in: House Stairs: Yes: Internal: 14  steps; can reach both and External: 1 threshold  steps; none  Master bedroom on main level  - has not been up steps to 2nd level of home yet as only been home for 2 days  Has following equipment: has built in seat in walk-in shower but not using it   PLOF: Independent - has a home gym, worked in [employer] and had driven to/from [employer] on the day of the incident  PATIENT GOALS: To improve my strength and balance.  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  HAND DOMINANCE: Right  ADLs: Overall ADLs: Pt has resumed all ADLs on his own, even standing in shower, managing fasteners although they may take a little longer.  IADLs: Shopping: Goes out with wife but unable to drive Light housekeeping: Wife isn't currently letting him Meal Prep: Wife is still doing meal prep although he was primary with this task previously Community mobility: Ind Medication management: Wife has it set up for him Financial management: Assisting wife, able to recall account numbers etc Handwriting: 100% legible - no change R UE unaffected  MOBILITY STATUS: Independent  POSTURE COMMENTS:  forward head Sitting balance: WNL  ACTIVITY TOLERANCE: Activity tolerance: limited  FUNCTIONAL OUTCOME  MEASURES: Lawton-Brody IADL Scale: 3/8    UPPER EXTREMITY ROM:    Active ROM Right eval Left eval  Shoulder flexion WNL Slight end range limitations  Shoulder abduction    Shoulder adduction    Shoulder extension    Shoulder internal rotation    Shoulder external rotation    Elbow flexion    Elbow extension    Wrist flexion    Wrist extension    Wrist ulnar deviation    Wrist radial deviation    Wrist pronation    Wrist supination    (Blank rows = not tested)  UPPER EXTREMITY MMT:     MMT Right eval Left eval  Shoulder flexion 5/5 4/5  Shoulder abduction    Shoulder adduction    Shoulder extension    Shoulder internal rotation    Shoulder external rotation    Middle trapezius    Lower trapezius    Elbow flexion    Elbow extension    Wrist flexion    Wrist extension    Wrist ulnar deviation    Wrist radial deviation    Wrist pronation    Wrist supination    (Blank rows = not tested)  HAND FUNCTION: Grip strength: Right: 60.4 - 1st attempt not included, 82.2, 86.1, 106.0  lbs; Left: 51.3, 54.8, 52.6 lbs Average: Right: 91.4 lbs Left: 52.9 lbs  COORDINATION: 9 Hole Peg test: Right: 19.15 sec; Left: 32.18 sec Box and Blocks:  Right 51 blocks, Left 37 blocks  SENSATION: WFL  EDEMA: NA  MUSCLE TONE: WFL  COGNITION: Overall cognitive status: Within functional limits for tasks assessed Recalled account number to bills independently VISION: Subjective report: Drops for glaucoma prevention/keep pressure down Baseline vision: Wears glasses for reading only Visual history:  NA  VISION ASSESSMENT: WFL  Patient has difficulty with following activities due to following visual impairments: NA  PERCEPTION: Not tested  PRAXIS: Not tested  OBSERVATIONS at eval: Pt ambulated with use of no AE and no loss of balance but with mildly decreased L arm swing and what OTR still noted as a slight favoring of R side with gaze. The pt is well kept and was  accompanied by his high school sweetheart/wife who did admit to overdoing it when helping him at times when pointed out by OTR.  TREATMENT DATE:   TherEx OT initiated theraband (blue) HEP - to improve BUE strengthening and gross motor coordination, to improve upright static standing balance and tolerance. All exercises except shoulder flex completed standing with gait belt, supervision A. Pt denied pain with all exercises and returned demonstration of all exercises. Access Code: [facility] URL: [email or web address] Date: 01/12/2024 Prepared by: Carilyn Goodpasture  Exercises - Full Plank on Knees  - 1 x daily - 2 sets - 30 s hold - v/c for breathing - Plank on Knees  - 1 x daily - 2 sets - 30 s hold - v/c for breathing - Shoulder Alphabet with Ball at Wall  - 1 x daily - 1 sets - Shoulder extension with resistance - Neutral  - 1 x daily - 2 sets - 10 reps - Seated Shoulder Flexion with Self-Anchored Resistance  - 1 x daily - 2 sets - 10 reps - Standing Shoulder Row with Anchored Resistance  - 1 x daily - 2 sets - 10 reps - Standing Shoulder Horizontal Abduction with Resistance  - 1 x daily - 2 sets - 10 reps - Shoulder External Rotation and Scapular Retraction with Resistance  - 1 x daily - 2 sets - 10 reps - Standing Single Arm Elbow Flexion with Resistance  - 1 x daily - 2 sets - 10 reps - Standing Elbow Extension with Self-Anchored Resistance  - 1 x daily - 2 sets - 10 reps  Throughout all tasks, pt demo'd good standing balance and standing tolerance. With shoulder ext with theraband, pt demo'd slight forward lean though demo'd improved attention to upright standing posture following v/c. Pt denied pain with all exercises.  Body blade - to improve BUE gross motor coordination, to improve BUE strengthening, to improve static standing balance and tolerance - x1 set  vertical orientation, x1 set horizontal orientation. Pt demo'd increased difficulty with LUE compared to RUE.  PATIENT EDUCATION: Education details: see today's treatment above, breathing during and between exercises, UE anatomy, coordination, options to complete all BUE theraband exercises: standing with feet shoulder width apart, standing with feet staggered step to improve balance, seated to reduce fall risk. Person educated: Patient and Spouse Education method: Explanation, Demonstration, Verbal cues, and Handouts Education comprehension: verbalized understanding, returned demonstration, and needs further education  HOME EXERCISE PROGRAM: 01/11/24 - FM coordination HEP 01/12/24 - BUE strengthening Theraband - Access Code: [email or web address]  GOALS: Goals reviewed with patient? Yes in general but will review specific goals at next visit  LONG TERM GOALS: Target date: 02/02/24  Patient will demonstrate initial LUE strength/coordination HEP with visual handouts only for proper execution to resume use of home gym including Bo-Flex etc. Baseline: New to outpt OT Goal status: INITIAL  2.  Patient will demonstrate at least 70+ lbs LUE grip strength as needed to open jars and other containers. Baseline: 52.9 lbs Goal status: INITIAL  3.  Patient will demo improved FM coordination as evidenced by completing nine-hole peg with use of LUE in 25 seconds or less. Baseline: 32.18 Goal status: INITIAL  4.  Pt will be able to place at least 42+ blocks using left hand with completion of Box and Blocks test. Baseline: 37 blocks Goal status: INITIAL  5.  Patient will demonstrate at least 95% accuracy with environmental visual scanning in distracting environment to assist with ADLs and IADLS including eventual driving considerations and work-related tasks as Hydrographic surveyor.   Baseline: New to outpt OT Goal status: INITIAL  6.  Pt  will have adequate standing tolerance and balance to safely complete  IADLS without supervision including preparing meals per PLOF. Baseline: Spouse is preparing meals. Brody-Lawton Scale 3/8 Goal status: INITIAL  7. Patient will demo improved BUE FM coordination as evidenced by independence with sorting medication on his own without dropping pills.  Baseline: Wife is assisting him Goal status: INITIAL  ASSESSMENT:  CLINICAL IMPRESSION: Pt tolerated BUE strengthening tasks well today. Pt benefited from v/c for breathing throughout exercises and for upright standing posture though demo'd good overall balance and stability when completing tasks standing today. Pt would benefit from skilled OT services in the outpatient setting to work on impairments as noted below to help pt return to PLOF as able.    PERFORMANCE DEFICITS: in functional skills including IADLs, coordination, dexterity, proprioception, ROM, strength, Fine motor control, Gross motor control, balance, endurance, vision, and UE functional use, cognitive skills including attention, and psychosocial skills including coping strategies and routines and behaviors.   IMPAIRMENTS: are limiting patient from IADLs, work, leisure, and social participation.   CO-MORBIDITIES: may have co-morbidities  that affects occupational performance. Patient will benefit from skilled OT to address above impairments and improve overall function.  MODIFICATION OR ASSISTANCE TO COMPLETE EVALUATION: No modification of tasks or assist necessary to complete an evaluation.  OT OCCUPATIONAL PROFILE AND HISTORY: Problem focused assessment: Including review of records relating to presenting problem.  CLINICAL DECISION MAKING: LOW - limited treatment options, no task modification necessary  REHAB POTENTIAL: Excellent  EVALUATION COMPLEXITY: Low    PLAN:  OT FREQUENCY: 1-2x/week  OT DURATION: up to 6 weeks but planning for 4 weeks  PLANNED INTERVENTIONS: 97535 self care/ADL training, 16109 therapeutic exercise, 97530  therapeutic activity, 97112 neuromuscular re-education, functional mobility training, coping strategies training, and patient/family education  RECOMMENDED OTHER SERVICES: PT evaluation occurred date of eval also  CONSULTED AND AGREED WITH PLAN OF CARE: Patient and family member/caregiver  PLAN FOR NEXT SESSION:  Strengthening HEP - progress HEP as tolerated Body blade - LUE especially Coordination activities - progress HEP as tolerated Scanning with distractions/Reaction times - Blaze Pods Standing balance and activity tolerance for cooking   Wynetta Emery, OT 01/12/2024, 12:15 PM

## 2024-01-13 ENCOUNTER — Encounter: Payer: Self-pay | Admitting: Radiation Oncology

## 2024-01-14 ENCOUNTER — Other Ambulatory Visit: Payer: Self-pay

## 2024-01-15 ENCOUNTER — Ambulatory Visit: Payer: Commercial Managed Care - PPO | Admitting: Physical Therapy

## 2024-01-15 ENCOUNTER — Ambulatory Visit
Admission: RE | Admit: 2024-01-15 | Discharge: 2024-01-15 | Disposition: A | Discharge status: Home / Self Care | Payer: Commercial Managed Care - PPO | Source: Ambulatory Visit | Attending: Radiation Oncology | Admitting: Radiation Oncology

## 2024-01-15 ENCOUNTER — Encounter: Payer: Self-pay | Admitting: Internal Medicine

## 2024-01-15 ENCOUNTER — Encounter: Payer: Self-pay | Admitting: Physical Therapy

## 2024-01-15 ENCOUNTER — Telehealth: Payer: Self-pay | Admitting: Genetic Counselor

## 2024-01-15 ENCOUNTER — Encounter: Payer: Self-pay | Admitting: Radiation Oncology

## 2024-01-15 VITALS — BP 142/93 | HR 79 | Temp 97.5°F | Resp 18 | Ht 74.0 in | Wt 271.0 lb

## 2024-01-15 VITALS — BP 127/80

## 2024-01-15 DIAGNOSIS — M6281 Muscle weakness (generalized): Secondary | ICD-10-CM

## 2024-01-15 DIAGNOSIS — I69252 Hemiplegia and hemiparesis following other nontraumatic intracranial hemorrhage affecting left dominant side: Secondary | ICD-10-CM | POA: Diagnosis not present

## 2024-01-15 DIAGNOSIS — Z510 Encounter for antineoplastic radiation therapy: Secondary | ICD-10-CM | POA: Diagnosis not present

## 2024-01-15 DIAGNOSIS — C711 Malignant neoplasm of frontal lobe: Secondary | ICD-10-CM

## 2024-01-15 DIAGNOSIS — R2681 Unsteadiness on feet: Secondary | ICD-10-CM

## 2024-01-15 DIAGNOSIS — R2689 Other abnormalities of gait and mobility: Secondary | ICD-10-CM

## 2024-01-15 MED ORDER — SODIUM CHLORIDE 0.9% FLUSH
10.0000 mL | Freq: Once | INTRAVENOUS | Status: AC
  Administered 2024-01-15: 10 mL via INTRAVENOUS

## 2024-01-15 NOTE — Progress Notes (Signed)
Has armband been applied?  Yes  Does patient have an allergy to IV contrast dye?: no   Has patient ever received premedication for IV contrast dye?: n/a  Does patient take metformin?: No  If patient does take metformin when was the last dose: n/a   Date of lab work: 12/28/2023 BUN: 13 CR: 1.05 eGfr: >60  IV site: Right Forearm  Has IV site been added to flowsheet?  Yes  BP (!) 142/93 (BP Location: Left Arm, Patient Position: Sitting)   Pulse 79   Temp (!) 97.5 F (36.4 C) (Temporal)   Resp 18   Ht 6\' 2"  (1.88 m)   Wt 271 lb (122.9 kg)   SpO2 99%   BMI 34.79 kg/m

## 2024-01-15 NOTE — Therapy (Signed)
OUTPATIENT PHYSICAL THERAPY NEURO TREATMENT NOTE   Patient Name: Nicholas Stevens MRN: [id number] DOB:1971/11/08, 53 y.o., male Today's Date: 01/15/2024   PCP: Jenelle Mages., MD REFERRING PROVIDER: Charlton Amor, PA-C  END OF SESSION:  PT End of Session - 01/15/24 1948     Visit Number 6    Number of Visits 13    Date for PT Re-Evaluation 02/02/24    Authorization Type UHC    Authorization Time Period 01-02-24 - 03-01-24    PT Start Time 1020    PT Stop Time 1105    PT Time Calculation (min) 45 min    Equipment Utilized During Treatment Gait belt    Activity Tolerance Patient tolerated treatment well    Behavior During Therapy WFL for tasks assessed/performed               Past Medical History:  Diagnosis Date   DDD (degenerative disc disease), lumbar    HNP (herniated nucleus pulposus)    Hypertension    Liver hemangioma    Morbid obesity (HCC)    OSA (obstructive sleep apnea)    no cpap used since weight loss   Overweight(278.02)    Plantar fasciitis of right foot    Sleep apnea    Tinea barbae    Past Surgical History:  Procedure Laterality Date   BREATH TEK H PYLORI N/A 08/20/2013   Procedure: BREATH TEK Richardo Priest;  Surgeon: Valarie Merino, MD;  Location: [facility];  Service: General;  Laterality: N/A;   COLONOSCOPY WITH PROPOFOL N/A 05/05/2022   Procedure: COLONOSCOPY WITH PROPOFOL;  Surgeon: Beverley Fiedler, MD;  Location: [facility];  Service: Gastroenterology;  Laterality: N/A;   CRANIOTOMY Right 12/18/2023   Procedure: Right Frontal Stereotactic Craniotomy for Resection of Tumor;  Surgeon: Bedelia Person, MD;  Location: [facility];  Service: Neurosurgery;  Laterality: Right;   ESOPHAGOGASTRODUODENOSCOPY (EGD) WITH PROPOFOL N/A 03/15/2023   Procedure: ESOPHAGOGASTRODUODENOSCOPY (EGD) WITH PROPOFOL;  Surgeon: Iva Boop, MD;  Location: [facility];  Service: Gastroenterology;  Laterality: N/A;   LAPAROSCOPIC APPENDECTOMY  2007   Dr  Carolynne Edouard   LAPAROSCOPIC GASTRIC SLEEVE RESECTION N/A 10/08/2013   Procedure: LAPAROSCOPIC SLEEVE GASTRECTOMY;  Surgeon: Valarie Merino, MD;  Location: [facility];  Service: General;  Laterality: N/A;   LUMBAR LAMINECTOMY/DECOMPRESSION MICRODISCECTOMY N/A 09/16/2015   Procedure: MICRO LUMBAR DECOMPRESSION, MICRODISCECTOMY L5 - S1;  Surgeon: Jene Every, MD;  Location: [facility];  Service: Orthopedics;  Laterality: N/A;   POLYPECTOMY  05/05/2022   Procedure: POLYPECTOMY;  Surgeon: Beverley Fiedler, MD;  Location: [facility];  Service: Gastroenterology;;   ROTATOR CUFF REPAIR Right 2005   Dr Lajoyce Corners   Patient Active Problem List   Diagnosis Date Noted   Allergic drug rash 12/29/2023   DVT, lower extremity, distal (HCC) 12/29/2023   Frontal glioblastoma (HCC) 12/26/2023   Brain mass 12/15/2023   Pseudofolliculitis barbae 05/01/2023   Iron deficiency 04/06/2023   Abdominal pain 03/15/2023   Microcytosis 03/14/2023   RUQ pain 03/12/2023   Abnormal EKG 03/12/2023   Chest pain 03/12/2023   Adult acne 01/19/2023   Obesity (BMI 30-39.9) 01/19/2023   Benign neoplasm of cecum    Benign neoplasm of ascending colon    Benign neoplasm of transverse colon    Difficult airway for intubation 01/18/2022   Upper respiratory disease 01/31/2019   Cerumen impaction 07/30/2018   HTN (hypertension) 11/03/2017   Spinal stenosis of lumbar region 09/16/2015   Insomnia 04/15/2015  Well adult exam 11/04/2014   H/O gastric sleeve 10/08/2013   Hypogonadism, male 07/17/2013   Erectile dysfunction 03/22/2013   Concentration deficit 04/27/2011   Attention or concentration deficit 04/27/2011   TINEA BARBAE 03/04/2009   LIVER HEMANGIOMA 03/04/2009   OVERWEIGHT-BMI 42 03/04/2009   OSA on CPAP 03/04/2009   DEGENERATIVE DISC DISEASE, LUMBAR SPINE 03/04/2009    ONSET DATE: 12-15-23  REFERRING DIAG: C71.9 (ICD-10-CM) - Malignant neoplasm of brain, unspecified  THERAPY DIAG:  Muscle weakness  (generalized)  Unsteadiness on feet  Other abnormalities of gait and mobility  Rationale for Evaluation and Treatment: Rehabilitation  SUBJECTIVE:                                                                                                                                                                                             SUBJECTIVE STATEMENT: Pt reports he is doing well - continues to do exercises at home (HEP as issued); continues to walk in cul-de-sac with his wife, Mervyn Gay  Per chart note:  54 yo male presenting 12/20 to ED with decreased response time while driving, incoordination, imbalance. Imaging showed necrotic and hemorrhagic mass in the superior right frontal lobe with features of glioblastoma with mass effect. S/p R craniotomy 12/23. PMH includes: obesity and OSA.   Pt accompanied by:  wife  - [name]  PERTINENT HISTORY: history of OSA, HTN, Tinea barbae, obesity s/p gastric sleeve, liver hemangioma who was admitted on 12/15/23 with reports of decrease in coordination, minimal use of LUE/LLE and; hospital admission 12-15-23 with transfer to inpatient rehab 12-26-23 - 12-31-23  PAIN:  Are you having pain? No  Vitals:   01/15/24 1027  BP: 127/80       PRECAUTIONS: Other: No driving; Fall  RED FLAGS: None   WEIGHT BEARING RESTRICTIONS: No  FALLS: Has patient fallen in last 6 months? No  LIVING ENVIRONMENT: Lives with: lives with their spouse Lives in: House/apartment Stairs: Yes: Internal: 12 steps; can reach both master bedroom on main level  - has not been up steps to 2nd level of home yet as only been home for 2 days Has following equipment at home: None  PLOF: Independent; pt retired from [employer] 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  DIAGNOSTIC FINDINGS: MRI brain  done revealing necrotic, hemorrhagic mass in superior right frontal lobe with features of glioblastoma and mass effect with very early right uncal herniation.  COGNITION: Overall cognitive status: Within functional limits for tasks assessed and appears to have slightly delayed processing - see OT eval for details   SENSATION: WFL  COORDINATION: WFL's bil. LE's  POSTURE: No Significant postural limitations  LOWER EXTREMITY ROM:   WNL's bil. LE's   LOWER EXTREMITY MMT:  RLE WNL's  MMT Right Eval Left Eval  Hip flexion 5 4-  Hip extension 5 3-  Hip abduction  4  Hip adduction    Hip internal rotation    Hip external rotation    Knee flexion 5 4-  Knee extension 5 5  Ankle dorsiflexion 5 5  Ankle plantarflexion 5 5  Ankle inversion    Ankle eversion    (Blank rows = not tested)  BED MOBILITY:  Independent  TRANSFERS: Assistive device utilized: None  Sit to stand: Modified independence Stand to sit: Modified independence  RAMP: TBA  CURB: TBA  STAIRS: TBA   GAIT: Gait pattern: step through pattern and decreased arm swing- Left Distance walked: 100' Assistive device utilized: None Level of assistance: SBA Comments: mildly decreased Lt arm swing   FUNCTIONAL TESTS:  5 times sit to stand: 11.69 secs from chair without UE support Timed up and go (TUG): 10.87 secs without device 10 meter walk test: 11.68 secs without device = 2.81 ft/sec Berg Balance Scale: 54/56   PATIENT SURVEYS:  N/A due to dx of brain tumor                                                                                                                            TREATMENT DATE:  01/15/24  TherEx:  Bridging x 10 reps, Lt unilateral bridge 10 reps   Bridging with marching 5 reps:  bridging with RLE extension 5 reps:   bridging with hip abdct./adduction 5 reps   Lt hip abduction in Rt sidelying position - 3# weight - 10 reps x 2 sets;  Clam shell exercise with 3# weight 10 reps x 2 sets   Lt hip abduction in Rt sidelying  - rainbow arc motion - 3 taps with Lt foot   Prone hip extension with knee flexed at 90 degrees with 3# weight - RLE 10 reps;  LLE 10 reps with 3# weight;  Lt hip extension with knee extended  10 reps with no weight Prone plank for core stabilization - 3 reps of 5 sec hold;  Lt sidelying plank - 3 reps 5 sec hold  Standing - 3# weight on LLE - tapping 2nd step with Lt foot for Lt hip flexor strengthening and for improved balance as this exercise was performed without UE support with SBA;  Lt foot dragged minimally on final 2 reps  Therapeutic Activity:   Vitals:   01/15/24 1027  BP: 127/80   Jumping on mini trampoline - 10  reps with UE support, progressing to 1 UE support and then no UE support 10 reps each; 1/2 jumping jacks 10 reps without UE support; lunges 10 reps without UE support  Pt performed jogging 40' - forwards x 4 reps;  backwards x 3 reps -- no LOB with this activity  Pt stood on blue mat on floor - performed lifting 10# kettlebell from floor 5 reps - 3 positions- 1) directly down in front to lifting overhead   2 &3)   diagonals each direction "X"  NeuroRe-ed: Pt stood on Bosu inside // bars - 10 squats with bil. UE support - legs noted to tremor with standing on this surface Progressed to 1 UE support on // bar 10 reps LLE  Pt performed Lt 1/2 kneeling position on mat on floor - performed horizontal head turns 10 reps; vertical head turns 10 reps without UE support     PATIENT EDUCATION: Education details: additions of single leg bridging and sidelying hip ABD rainbow heel taps to HEP, will continue to monitor BP and may have to reach out to PCP if it remains elevated  Person educated: Patient and wife Education method: Explanation, Demonstration, Verbal cues, and  Handouts Education comprehension: verbalized understanding and returned demonstration  HOME EXERCISE PROGRAM: Access Code: [email or web address] URL: [email or web address] Date: 01/04/2024 Prepared by: Maebelle Munroe  Exercises - 2# weight used for SLR, knee flexion and clam shell and hip abduction  - Supine Active Straight Leg Raise  - 1 x daily - 7 x weekly - 3 sets - 10 reps - SIDELYING LEG LIFTS; ALSO DO CIRCLES CLOCKWISE & COUNTERCLOCKWISE  - 1 x daily - 7 x weekly - 3 sets - 10 reps - Prone Hip Extension  - 1 x daily - 7 x weekly - 3 sets - 10 reps - Clamshell  - 1 x daily - 7 x weekly - 3 sets - 10 reps - 5 sec hold - Prone Knee Flexion AROM  - 1 x daily - 7 x weekly - 3 sets - 10 reps - Single Leg Stance  - 1 x daily - 7 x weekly - 1 sets - 2-3 reps - 15 sec hold - Tandem Stance  - 1 x daily - 7 x weekly - 1 sets - 2 reps - 30 sec  hold - Romberg Stance Eyes Closed on Foam Pad  - 1 x daily - 5 x weekly - 3 sets - 30 hold - Narrow Stance with Eyes Closed and Head Rotation on Foam Pad  - 1 x daily - 5 x weekly - 2 sets - 10 reps - Single Leg Bridge  - 1 x daily - 5 x weekly - 2 sets - 10 reps  GOALS: Goals reviewed with patient? Yes  SHORT TERM GOALS: SAME AS LTG'S AS ELOS = 4 WEEKS     LONG TERM GOALS: Target date: 02-02-24  Assess FGA and set goal as appropriate. Baseline:  Goal status: INITIAL  2.  Improve Berg score to 56/56 to demo improved static standing balance. Baseline: 54/56 on 01-02-24 Goal status: INITIAL  3.  Pt will amb. 10" nonstop on various surfaces including grass, pavement and indoor, without LOB and with no c/o fatigue, demonstrating Lt arm swing, with supervision.  Baseline:  Goal status: INITIAL  4.  Increase gait velocity to >/= 3.6 ft/sec without use of device for increased gait efficiency.  Baseline: 2.81 ft/sec (11.68 secs) Goal status: INITIAL  5.  Pt will jog 115' on  flat, even surface independently without LOB and RPE </= 2/10.   Baseline:  Goal status: INITIAL  6.  Negotiate 4 steps using step over step sequence with supervision without use of hand rail to demo improved balance. Baseline:  Goal status: INITIAL  7.  Independent in HEP for LLE strengthening, balance and coordination.   Baseline:  Goal status: INITIAL  8.  Pt will improve composite score to above normal for age in order to demo improved balance.   Baseline: 64 (below normal)   Goal status: INITIAL  ASSESSMENT:  CLINICAL IMPRESSION: PT session focused on Lt hip strengthening, core stabilization, and plyometrics including jumping and jogging for coordination and cardiovascular conditioning.  Session also included balance training with pt standing on Bosu inside // bars for UE support; pt's LE's noted to tremor with standing on this compliant surface.  Pt had no difficulty or LOB with jogging or jumping.  Cont to be moderately challenged with Lt hip abdct. And hip extensor strengthening due to weakness in these muscle groups.  Pt is progressing well towards LTG's.   OBJECTIVE IMPAIRMENTS: Abnormal gait, decreased activity tolerance, decreased balance, decreased coordination, and decreased strength.   ACTIVITY LIMITATIONS: carrying, lifting, squatting, stairs, and locomotion level  PARTICIPATION LIMITATIONS: cleaning, laundry, driving, shopping, community activity, and occupation  PERSONAL FACTORS: Profession and 1 comorbidity: s/p craniotomy due to brain tumor  are also affecting patient's functional outcome.   REHAB POTENTIAL: Good  CLINICAL DECISION MAKING: Evolving/moderate complexity  EVALUATION COMPLEXITY: Moderate  PLAN:  PT FREQUENCY: 3x/week  PT DURATION: 4 weeks  PLANNED INTERVENTIONS: 97110-Therapeutic exercises, 97530- Therapeutic activity, [id number]- Neuromuscular re-education, [phone]- Self Care, [id number]- Gait training, [phone]- Aquatic Therapy, Patient/Family education, Balance training, and Stair training  PLAN FOR NEXT SESSION:  CHECK BP - was WNL's on 01-15-24 but was elevated on 01-12-24  work on LLE strengthening, - esp. Lt hip abductor and hip extensor strengthening:   elliptical, high level balance and strengthening, balance with EC/compliant surfaces  Robbert Langlinais, Donavan Burnet, PT 01/15/2024, 7:53 PM

## 2024-01-15 NOTE — Progress Notes (Signed)
[facility] Clinical Social Work  Initial Assessment   Nicholas Stevens is a 53 y.o. year old male contacted by phone. Clinical Social Work was referred by medical provider for assessment of psychosocial needs.   SDOH (Social Determinants of Health) assessments performed: Yes   SDOH Screenings   Food Insecurity: No Food Insecurity (12/18/2023)  Housing: Low Risk  (12/20/2023)  Transportation Needs: No Transportation Needs (12/18/2023)  Utilities: Not At Risk (12/18/2023)  Depression (PHQ2-9): Low Risk  (01/19/2023)  Financial Resource Strain: Low Risk  (04/02/2023)  Physical Activity: Insufficiently Active (04/02/2023)  Social Connections: Socially Integrated (04/02/2023)  Stress: No Stress Concern Present (04/02/2023)  Tobacco Use: Low Risk  (01/12/2024)     Distress Screen completed: No     No data to display            Family/Social Information:  Housing Arrangement: patient lives with his wife and their youngest son who is 20.  Family members/support persons in your life? Pt has 2 other adult children ages 33 and 64 who are able to assist as needed.  Pt's spouse is his primary source of support.  Pt's spouse is employed as an Print production planner and has some flexibility w/ her schedule to assist pt to appointments and at home as needed.   Transportation concerns: no  Employment: Retired pt is retired from the [employer] and had been working as a Surveyor, minerals as a Actuary. Engineer, mining.  At this time pt is not working.  Income source: Film/video editor concerns: No Type of concern: None Food access concerns: no Religious or spiritual practice: Yes-[employer] Currently in place:  none  Coping/ Adjustment to diagnosis: Patient understands treatment plan and what happens next? yes Concerns about diagnosis and/or treatment: Overwhelmed by information and Quality of life Patient reported stressors: Adjusting to my illness Hopes and/or priorities: Pt's priority is to start treatment w/ the hope  of positive results Patient enjoys time with family/ friends Current coping skills/ strengths: Capable of independent living , Motivation for treatment/growth , Physical Health , and Supportive family/friends     SUMMARY: Current SDOH Barriers:  No barriers identified at this time.  Clinical Social Work Clinical Goal(s):  No clinical social work goals at this time  Interventions: Discussed common feeling and emotions when being diagnosed with cancer, and the importance of support during treatment Informed patient of the support team roles and support services at [facility] Provided CSW contact information and encouraged patient to call with any questions or concerns Pt and spouse will meet w/ CSW in conjunction w/ Dr. Liana Gerold appointment to complete glio assessments.     Follow Up Plan: CSW will see patient on next medical oncology appointment. Patient verbalizes understanding of plan: Yes    Rachel Moulds, LCSW Clinical Social Worker [facility]

## 2024-01-15 NOTE — Telephone Encounter (Signed)
Scheduled appointments per scheduling message. Patient is aware of the made appointments and will be mailed an appointment reminder.

## 2024-01-16 ENCOUNTER — Ambulatory Visit (INDEPENDENT_AMBULATORY_CARE_PROVIDER_SITE_OTHER): Payer: Commercial Managed Care - PPO | Admitting: Dermatology

## 2024-01-16 ENCOUNTER — Encounter: Payer: Self-pay | Admitting: Dermatology

## 2024-01-16 VITALS — BP 121/81

## 2024-01-16 DIAGNOSIS — L249 Irritant contact dermatitis, unspecified cause: Secondary | ICD-10-CM

## 2024-01-16 DIAGNOSIS — L309 Dermatitis, unspecified: Secondary | ICD-10-CM

## 2024-01-16 DIAGNOSIS — L731 Pseudofolliculitis barbae: Secondary | ICD-10-CM | POA: Diagnosis not present

## 2024-01-16 DIAGNOSIS — L219 Seborrheic dermatitis, unspecified: Secondary | ICD-10-CM

## 2024-01-16 MED ORDER — TRIAMCINOLONE ACETONIDE 0.1 % EX CREA: 1.0000 | TOPICAL_CREAM | Freq: Every day | CUTANEOUS | 0 refills | Status: AC

## 2024-01-16 NOTE — Progress Notes (Signed)
   Follow-Up Visit   Subjective  Nicholas Stevens is a 53 y.o. male who presents for the following: f/u of PFB   Patient present today for follow up visit. Patient was last evaluated on 10/12/23. Patient reports sxs are better. Pt stated that he has completed the doxycycline course but he is still applying the Pimecrolimus and using Epiduo. Patient denies medication changes.  The following portions of the chart were reviewed this encounter and updated as appropriate: medications, allergies, medical history  Review of Systems:  No other skin or systemic complaints except as noted in HPI or Assessment and Plan.  Objective  Well appearing patient in no apparent distress; mood and affect are within normal limits.   A focused examination was performed of the following areas: jaw line and neck   Relevant exam findings are noted in the Assessment and Plan.           Assessment & Plan    Pseudofolliculitis Barbae Assessment: Chronic condition with ongoing management. Patient reports improvement with current regimen, no dryness with Epiduo use.  Plan: Continue Epiduo application every other night. Use pimecrolimus cream as needed, currently recommended for 5-7 days due to mild activity. Refills not required at this time.  Seborrheic Dermatitis Assessment: Patient presents with flaking in beard area, consistent with seborrheic dermatitis.  Plan: Recommend DHS Zinc 2% wash for symptom management, available for purchase on [employer].  Dermatitis Assessment: Patient presents with a rash, possibly related to hypoallergenic sheets. Rash is mildly itchy.  Plan: Prescribe triamcinolone cream for topical application. Apply twice daily for up to 2 weeks. Discontinue if rash resolves before 2 weeks. For chronic use, implement a 2 weeks on, 2 weeks off regimen.     No follow-ups on file.    Documentation: I have reviewed the above documentation for accuracy and completeness, and I  agree with the above.   I, Shirron Marcha Solders, CMA, am acting as scribe for [employer], DO.   Langston Reusing, DO

## 2024-01-16 NOTE — Patient Instructions (Addendum)
Hello Nicholas Stevens,  Thank you for visiting [place] today. Below is a summary of the essential instructions from today's consultation:  Epiduo Gel: Continue applying Epiduo gel every other night on your beard area to manage pseudofolliculitis barbae.  Pimecrolimus Cream: Apply pimecrolimus cream as necessary, particularly post-shaving if the area appears pink and active. Application duration should be 5-7 days during flare-ups.  DHS Zinc Wash: For seborrheic dermatitis in the beard area, employ DHS Zinc wash. This product is available for online purchase.  Triamcinolone Cream: Apply triamcinolone cream twice daily for up to two weeks for the rash associated with hypoallergenic sheets. Adhere to a cycle of 2 weeks on treatment followed by 2 weeks off.  Medication Refills: No refills are required for Epiduo at this moment.  Next Appointment: Please schedule your follow-up appointment on a Tuesday or Thursday, starting in May or June.  For any urgent matters, please use MyChart as it offers the quickest way to communicate with [place]. We have documented today's visit and will dispatch the prescription for the rash along with an image of the recommended wash for your beard area.  We eagerly anticipate your progress and seeing you at your next appointment. Should you have any questions or concerns before then, do not hesitate to reach out.  Warm regards,  Dr. Lang[facility] Reusing [facility]      Important Information  Due to recent changes in healthcare laws, you may see results of your pathology and/or laboratory studies on MyChart before the doctors have had a chance to review them. We understand that in some cases there may be results that are confusing or concerning to you. Please understand that not all results are received at the same time and often the doctors may need to interpret multiple results in order to provide you with the best plan of care or course of treatment. Therefore, we ask that you  please give [place] 2 business days to thoroughly review all your results before contacting the office for clarification. Should we see a critical lab result, you will be contacted sooner.   If You Need Anything After Your Visit  If you have any questions or concerns for your doctor, please call our main line at [phone] If no one answers, please leave a voicemail as directed and we will return your call as soon as possible. Messages left after 4 pm will be answered the following business day.   You may also send [place] a message via MyChart. We typically respond to MyChart messages within 1-2 business days.  For prescription refills, please ask your pharmacy to contact our office. Our fax number is [phone].  If you have an urgent issue when the clinic is closed that cannot wait until the next business day, you can page your doctor at the number below.    Please note that while we do our best to be available for urgent issues outside of office hours, we are not available 24/7.   If you have an urgent issue and are unable to reach [place], you may choose to seek medical care at your doctor's office, retail clinic, urgent care center, or emergency room.  If you have a medical emergency, please immediately call 911 or go to the emergency department. In the event of inclement weather, please call our main line at [phone] for an update on the status of any delays or closures.  Dermatology Medication Tips: Please keep the boxes that topical medications come in in order to help keep  track of the instructions about where and how to use these. Pharmacies typically print the medication instructions only on the boxes and not directly on the medication tubes.   If your medication is too expensive, please contact our office at [phone] or send [place] a message through MyChart.   We are unable to tell what your co-pay for medications will be in advance as this is different depending on your insurance  coverage. However, we may be able to find a substitute medication at lower cost or fill out paperwork to get insurance to cover a needed medication.   If a prior authorization is required to get your medication covered by your insurance company, please allow [place] 1-2 business days to complete this process.  Drug prices often vary depending on where the prescription is filled and some pharmacies may offer cheaper prices.  The website www.goodrx.com contains coupons for medications through different pharmacies. The prices here do not account for what the cost may be with help from insurance (it may be cheaper with your insurance), but the website can give you the price if you did not use any insurance.  - You can print the associated coupon and take it with your prescription to the pharmacy.  - You may also stop by our office during regular business hours and pick up a GoodRx coupon card.  - If you need your prescription sent electronically to a different pharmacy, notify our office through [facility] or by phone at [phone]

## 2024-01-16 NOTE — Telephone Encounter (Signed)
Oral Chemotherapy Pharmacist Encounter  I spoke with patient and patient's wife, Mervyn Gay, for overview of: Temodar (temozolomide) for the treatment of glioblastoma in conjunction with radiation, planned duration concomitant phase 42 days of therapy.   Counseled patient on administration, dosing, side effects, monitoring, drug-food interactions, safe handling, storage, and disposal.  Patient will take Temodar 180mg  capsules, 1 capsule, 180 mg total daily dose, by mouth once daily, may take at bedtime and on an empty stomach to decrease nausea and vomiting.  Patient will take Temodar concurrent with radiation for 42 days straight.  Temodar start date: 01/21/24 PM Radiation start date: 01/22/24  Adverse effects include but are not limited to: nausea, vomiting, anorexia, GI upset, rash, and fatigue.  Prophylactic Zofran will not be used at initiation of concurrent phase, but will be initiated if nausea develops despite Temodar administration on an empty stomach and at bedtime. If this occurs, patient will take Zofran 8 mg tablet, 1 tablet by mouth 30-60 min prior to Temodar dose to help decrease N/V   Reviewed with patient importance of keeping a medication schedule and plan for any missed doses. No barriers to medication adherence identified.  Medication reconciliation performed and medication/allergy list updated.  All questions answered.  Mr. Hollands voiced understanding and appreciation.   Medication education handout and medication calendar placed in mail for patient. Patient knows to call the office with questions or concerns. Oral Chemotherapy Clinic phone number provided to patient.   Sherry Ruffing, PharmD, BCPS, BCOP Hematology/Oncology Clinical Pharmacist [facility] Oral Chemotherapy Navigation Clinics [phone] 01/16/2024 2:46 PM

## 2024-01-16 NOTE — Telephone Encounter (Signed)
Oral Chemotherapy Pharmacist Encounter   Attempted to reach patient to provide update and offer for initial counseling on oral medication: Temodar (temozolomide).   Spoke with wife, Mervyn Gay, and confirmed medication is being delivered to their home today from [employer]. [name] request call back at 2:30 PM to discuss medication details. Will call back at that time.   Sherry Ruffing, PharmD, BCPS, BCOP Hematology/Oncology Clinical Pharmacist [facility] Oral Chemotherapy Navigation Clinics [phone] 01/16/2024 12:20 PM

## 2024-01-17 ENCOUNTER — Ambulatory Visit: Payer: Commercial Managed Care - PPO | Admitting: Occupational Therapy

## 2024-01-17 ENCOUNTER — Encounter: Payer: Commercial Managed Care - PPO | Admitting: Physical Medicine and Rehabilitation

## 2024-01-17 ENCOUNTER — Telehealth: Payer: Self-pay | Admitting: Internal Medicine

## 2024-01-17 ENCOUNTER — Ambulatory Visit: Payer: Commercial Managed Care - PPO | Admitting: Physical Therapy

## 2024-01-17 NOTE — Telephone Encounter (Signed)
Scheduled appointments per 1/20 scheduling message. Patient is aware of the made appointments and is active on MyChart.

## 2024-01-18 ENCOUNTER — Other Ambulatory Visit: Payer: Self-pay

## 2024-01-18 ENCOUNTER — Emergency Department (HOSPITAL_COMMUNITY)
Admission: EM | Admit: 2024-01-18 | Discharge: 2024-01-19 | Disposition: A | Discharge status: Home / Self Care | Payer: Commercial Managed Care - PPO | Attending: Emergency Medicine | Admitting: Emergency Medicine

## 2024-01-18 DIAGNOSIS — G936 Cerebral edema: Secondary | ICD-10-CM | POA: Diagnosis not present

## 2024-01-18 DIAGNOSIS — Z7901 Long term (current) use of anticoagulants: Secondary | ICD-10-CM | POA: Diagnosis not present

## 2024-01-18 DIAGNOSIS — R569 Unspecified convulsions: Secondary | ICD-10-CM

## 2024-01-18 LAB — CBC
HCT: 54 % — ABNORMAL HIGH (ref 39.0–52.0)
Hemoglobin: 17.4 g/dL — ABNORMAL HIGH (ref 13.0–17.0)
MCH: 27.4 pg (ref 26.0–34.0)
MCHC: 32.2 g/dL (ref 30.0–36.0)
MCV: 85.2 fL (ref 80.0–100.0)
Platelets: 302 10*3/uL (ref 150–400)
RBC: 6.34 MIL/uL — ABNORMAL HIGH (ref 4.22–5.81)
RDW: 17.7 % — ABNORMAL HIGH (ref 11.5–15.5)
WBC: 6.4 10*3/uL (ref 4.0–10.5)
nRBC: 0 % (ref 0.0–0.2)

## 2024-01-18 LAB — MAGNESIUM: Magnesium: 2 mg/dL (ref 1.7–2.4)

## 2024-01-18 LAB — BASIC METABOLIC PANEL
Anion gap: 11 (ref 5–15)
BUN: 11 mg/dL (ref 6–20)
CO2: 24 mmol/L (ref 22–32)
Calcium: 9.4 mg/dL (ref 8.9–10.3)
Chloride: 102 mmol/L (ref 98–111)
Creatinine, Ser: 0.97 mg/dL (ref 0.61–1.24)
GFR, Estimated: >60 mL/min (ref >60)
Glucose, Bld: 92 mg/dL (ref 70–99)
Potassium: 4.2 mmol/L (ref 3.5–5.1)
Sodium: 137 mmol/L (ref 135–145)

## 2024-01-18 LAB — CBG MONITORING, ED: Glucose-Capillary: 86 mg/dL (ref 70–99)

## 2024-01-18 MED ORDER — SODIUM CHLORIDE 0.9 % IV SOLN: 3000.0000 mg | Freq: Once | INTRAVENOUS | Status: DC

## 2024-01-18 MED ORDER — LEVETIRACETAM IN NACL 1000 MG/100ML IV SOLN
1000.0000 mg | Freq: Once | INTRAVENOUS | Status: AC
  Administered 2024-01-18: 1000 mg via INTRAVENOUS
  Filled 2024-01-18: qty 100

## 2024-01-18 MED ORDER — SODIUM CHLORIDE 0.9 % IV BOLUS
500.0000 mL | Freq: Once | INTRAVENOUS | Status: AC
  Administered 2024-01-18: 500 mL via INTRAVENOUS

## 2024-01-18 NOTE — ED Provider Triage Note (Signed)
Emergency Medicine Provider Triage Evaluation Note  Nicholas Stevens , a 53 y.o. male  was evaluated in triage.  Pt complains of a seizure that lasted 6 minutes.  Patient had a frontal glioma resection by Dr. Maisie Fus [phone].  He was on Keppra for seizure prophylaxis until 1/13.  Patient never had a seizure.  He reports he had worked out this evening.  He felt well.  He came downstairs and sat on the couch.  He reports that his face started to twitch uncontrollably.  He could feel his eyes and his forehead twitching.  While it was happening he could hear his wife talking to him but could not stop the movements.  He then went on to get shaking of his left arm and leg.  He denies he lost consciousness but could not control the episode.  This lasted approximately 6 minutes and then spontaneously resolved.  At this point he feels back to normal.  Review of Systems  Positive: Seizure activity Negative: Fever chills recent illness  Physical Exam  BP 136/87   Pulse 86   Temp 98.7 F (37.1 C) (Oral)   Resp 18   Ht 6\' 2"  (1.88 m)   Wt 122.9 kg   SpO2 99%   BMI 34.79 kg/m  Gen:   Awake, no distress well-nourished well-developed clear mental status Resp:  Normal effort clear MSK:   Moves extremities without difficulty movements are coordinated and symmetric Other:  Cranial nerves II through XII intact.  Movements coordinated symmetric upper extremity strength 5\5  Medical Decision Making  Medically screening exam initiated at 10:04 PM.  Appropriate orders placed.  Nicholas Stevens was informed that the remainder of the evaluation will be completed by another provider, this initial triage assessment does not replace that evaluation, and the importance of remaining in the ED until their evaluation is complete.     Arby Barrette, MD 01/18/24 2207

## 2024-01-18 NOTE — ED Triage Notes (Signed)
BIB EMS from home for witnessed seizure by wife lasted approx 5 mins. Appeared only on left side like pt was having muscle spasms. Pt had brain surgery 12/18/23 and was taken off seizure medication 01/08/24. Pt was told he was no longer needing seizure medication. Pt GCS 15 not postictal. No loss of bowel or bladder. No injury  126/80 80 RR 15 95% RA  CBG 117

## 2024-01-18 NOTE — ED Provider Notes (Signed)
EMERGENCY DEPARTMENT AT [facility] Provider Note   CSN: [id number] Arrival date & time: 01/18/24  2036     History {Add pertinent medical, surgical, social history, OB history to HPI:1} Chief Complaint  Patient presents with   Seizures    Nicholas Stevens is a 53 y.o. male.  HPI Nicholas Stevens , a 53 y.o. male  was evaluated in triage.  Pt complains of a seizure that lasted 6 minutes.  Patient had a frontal glioma resection by Dr. Maisie Fus [phone].  He was on Keppra for seizure prophylaxis until 1/13.  Patient never had a seizure.  He reports he had worked out this evening.  He felt well.  He came downstairs and sat on the couch.  He reports that his face started to twitch uncontrollably.  He could feel his eyes and his forehead twitching.  While it was happening he could hear his wife talking to him but could not stop the movements.  He then went on to get shaking of his left arm and leg.  He denies he lost consciousness but could not control the episode.  This lasted approximately 6 minutes and then spontaneously resolved.  At this point he feels back to normal.     Home Medications Prior to Admission medications   Medication Sig Start Date End Date Taking? Authorizing Provider  apixaban (ELIQUIS) 5 MG TABS tablet Take 1 tablet (5 mg total) by mouth 2 (two) times daily. 12/29/23   Love, Evlyn Kanner, PA-C  camphor-menthol [facility]) lotion Apply topically as needed for itching. 12/29/23   Love, Evlyn Kanner, PA-C  Cyanocobalamin (B-12 PO) Take 1 tablet by mouth daily.    [provider]  fexofenadine (ALLEGRA) 60 MG tablet Take 1 tablet (60 mg total) by mouth 2 (two) times daily. 12/29/23   Love, Evlyn Kanner, PA-C  latanoprost (XALATAN) 0.005 % ophthalmic solution Place 1 drop into both eyes at bedtime.    [provider]  levETIRAcetam (KEPPRA) 500 MG tablet Take 1 tablet (500 mg total) by mouth 2 (two) times daily. 12/29/23   Love, Evlyn Kanner, PA-C  LORazepam  (ATIVAN) 1 MG tablet Take 0.5 tablets (0.5 mg total) by mouth every 8 (eight) hours. 01/11/24   Ronny Bacon, PA-C  Multiple Vitamin (MULTIVITAMIN WITH MINERALS) TABS tablet Take 1 tablet by mouth daily.    [provider]  Omega-3 Fatty Acids (FISH OIL) 1000 MG CAPS Take 1,000 mg by mouth daily.    [provider]  ondansetron (ZOFRAN) 8 MG tablet Take 1 tablet (8 mg total) by mouth every 8 (eight) hours as needed for nausea or vomiting. May take 30-60 minutes prior to Temodar administration if nausea/vomiting occurs as needed. 01/22/24   Henreitta Leber, MD  pantoprazole (PROTONIX) 40 MG tablet Take 1 tablet (40 mg total) by mouth daily. 12/30/23   Love, Evlyn Kanner, PA-C  pimecrolimus (ELIDEL) 1 % cream Apply 1 Application topically 2 (two) times daily as needed (Dermatitis). 12/29/23   Love, Evlyn Kanner, PA-C  senna-docusate (SENOKOT-S) 8.6-50 MG tablet Take 2 tablets by mouth daily at 6 (six) AM. 12/30/23   Love, Evlyn Kanner, PA-C  temozolomide (TEMODAR) 180 MG capsule Take 1 capsule (180 mg total) by mouth daily. May take on an empty stomach to decrease nausea & vomiting. 01/22/24   Henreitta Leber, MD  triamcinolone cream (KENALOG) 0.1 % Apply 1 Application topically daily. Use on affected areas for 2 weeks, then stop for 2  weeks. Repeat until clear if needed. 01/16/24   Terri Piedra, DO  zolpidem (AMBIEN) 10 MG tablet TAKE 1 TABLET BY MOUTH EVERYDAY AT BEDTIME 08/08/23   Plotnikov, Georgina Quint, MD      Allergies    Patient has no known allergies.    Review of Systems   Review of Systems  Physical Exam Updated Vital Signs BP 136/87   Pulse 86   Temp 98.7 F (37.1 C) (Oral)   Resp 18   Ht 6\' 2"  (1.88 m)   Wt 122.9 kg   SpO2 99%   BMI 34.79 kg/m  Physical Exam Constitutional:      Comments: Alert nontoxic well in appearance.  Clear mental status.  HENT:     Mouth/Throat:     Mouth: Mucous membranes are moist.     Pharynx: Oropharynx is clear.  Eyes:      Extraocular Movements: Extraocular movements intact.     Pupils: Pupils are equal, round, and reactive to light.  Cardiovascular:     Rate and Rhythm: Normal rate and regular rhythm.  Pulmonary:     Effort: Pulmonary effort is normal.     Breath sounds: Normal breath sounds.  Abdominal:     General: There is no distension.     Palpations: Abdomen is soft.     Tenderness: There is no abdominal tenderness.  Musculoskeletal:        General: Normal range of motion.  Skin:    General: Skin is warm and dry.  Neurological:     General: No focal deficit present.     Mental Status: He is oriented to person, place, and time.     Motor: No weakness.     Coordination: Coordination normal.  Psychiatric:        Mood and Affect: Mood normal.     ED Results / Procedures / Treatments   Labs (all labs ordered are listed, but only abnormal results are displayed) Labs Reviewed  CBC - Abnormal; Notable for the following components:      Result Value   RBC 6.34 (*)    Hemoglobin 17.4 (*)    HCT 54.0 (*)    RDW 17.7 (*)    All other components within normal limits  BASIC METABOLIC PANEL  MAGNESIUM  CBG MONITORING, ED    EKG None  Radiology No results found.  Procedures Procedures  {Document cardiac monitor, telemetry assessment procedure when appropriate:1}  Medications Ordered in ED Medications  levETIRAcetam (KEPPRA) 3,000 mg in sodium chloride 0.9 % 250 mL IVPB (has no administration in time range)    ED Course/ Medical Decision Making/ A&P   {   Click here for ABCD2, HEART and other calculatorsREFRESH Note before signing :1}                              Medical Decision Making Amount and/or Complexity of Data Reviewed Labs: ordered.  Risk Prescription drug management.   Patient presents with description of seizure activity.  He had prior glioma resection with no seizures.  He is back to completely normal mental status at this point in time.  No signs of other  intercurrent precipitating events such as infection\dehydration.  Will obtain basic lab work and consult neurology.  Basic metabolic panel normal with potassium 4.2 GFR greater than 60 white count 6.4 hemoglobin 17.4 hematocrit 54  Consult: Reviewed with Dr. Otelia Limes. Keppra load 3000 mg and restarting Keppra  at 500 mg twice daily.  Okay to proceed with CT head.  If patient is completely asymptomatic at baseline, can discharge with 500 twice daily Keppra and close follow-up with his  {Document critical care time when appropriate:1} {Document review of labs and clinical decision tools ie heart score, Chads2Vasc2 etc:1}  {Document your independent review of radiology images, and any outside records:1} {Document your discussion with family members, caretakers, and with consultants:1} {Document social determinants of health affecting pt's care:1} {Document your decision making why or why not admission, treatments were needed:1} Final Clinical Impression(s) / ED Diagnoses Final diagnoses:  None    Rx / DC Orders ED Discharge Orders     None

## 2024-01-19 ENCOUNTER — Telehealth: Payer: Self-pay | Admitting: Internal Medicine

## 2024-01-19 ENCOUNTER — Ambulatory Visit: Payer: Commercial Managed Care - PPO | Admitting: Physical Therapy

## 2024-01-19 ENCOUNTER — Other Ambulatory Visit: Payer: Self-pay

## 2024-01-19 ENCOUNTER — Ambulatory Visit: Payer: Commercial Managed Care - PPO | Admitting: Occupational Therapy

## 2024-01-19 ENCOUNTER — Emergency Department (HOSPITAL_COMMUNITY): Payer: Commercial Managed Care - PPO

## 2024-01-19 DIAGNOSIS — Z510 Encounter for antineoplastic radiation therapy: Secondary | ICD-10-CM | POA: Diagnosis not present

## 2024-01-19 MED ORDER — LEVETIRACETAM 500 MG PO TABS: 500.0000 mg | ORAL_TABLET | Freq: Two times a day (BID) | ORAL | 0 refills | Status: DC

## 2024-01-19 MED ORDER — DEXAMETHASONE 4 MG PO TABS: 4.0000 mg | ORAL_TABLET | Freq: Four times a day (QID) | ORAL | 0 refills | Status: DC

## 2024-01-19 MED ORDER — DEXAMETHASONE SODIUM PHOSPHATE 10 MG/ML IJ SOLN
10.0000 mg | Freq: Once | INTRAMUSCULAR | Status: AC
  Administered 2024-01-19: 10 mg
  Filled 2024-01-19: qty 1

## 2024-01-19 NOTE — Telephone Encounter (Signed)
Scheduled appointment per 1/24 scheduling message. Spoke to the patients wife and she will inform the patient of the made appointment. Patient is active on MyChart.

## 2024-01-19 NOTE — ED Notes (Signed)
Patient transported to CT

## 2024-01-19 NOTE — Discharge Instructions (Addendum)
1.  Take Keppra 500 mg twice a day starting tomorrow morning. 2.  Call your neurologist/oncologist tomorrow to schedule a follow-up as soon as possible 3.  Return to the emergency department immediately if you have any new or concerning symptoms.  No driving or any activities that could result in injury if you have a seizure.      Per [facility] statutes, patients with seizures are not allowed to drive until they have been seizure-free for six months.  Other recommendations include using caution when using heavy equipment or power tools. Avoid working on ladders or at heights. Take showers instead of baths.  Do not swim alone.  Ensure the water temperature is not too high on the home water heater. Do not go swimming alone. Do not lock yourself in a room alone (i.e. bathroom). When caring for infants or small children, sit down when holding, feeding, or changing them to minimize risk of injury to the child in the event you have a seizure. Maintain good sleep hygiene. Avoid alcohol.  Also recommend adequate sleep, hydration, good diet and minimize stress.     During the Seizure   - First, ensure adequate ventilation and place patients on the floor on their left side  Loosen clothing around the neck and ensure the airway is patent. If the patient is clenching the teeth, do not force the mouth open with any object as this can cause severe damage - Remove all items from the surrounding that can be hazardous. The patient may be oblivious to what's happening and may not even know what he or she is doing. If the patient is confused and wandering, either gently guide him/her away and block access to outside areas - Reassure the individual and be comforting - Call 911. In most cases, the seizure ends before EMS arrives. However, there are cases when seizures may last over 3 to 5 minutes. Or the individual may have developed breathing difficulties or severe injuries. If a pregnant patient or a person  with diabetes develops a seizure, it is prudent to call an ambulance. - Finally, if the patient does not regain full consciousness, then call EMS. Most patients will remain confused for about 45 to 90 minutes after a seizure, so you must use judgment in calling for help. - Avoid restraints but make sure the patient is in a bed with padded side rails - Place the individual in a lateral position with the neck slightly flexed; this will help the saliva drain from the mouth and prevent the tongue from falling backward - Remove all nearby furniture and other hazards from the area - Provide verbal assurance as the individual is regaining consciousness - Provide the patient with privacy if possible - Call for help and start treatment as ordered by the caregiver   After the Seizure (Postictal Stage)   After a seizure, most patients experience confusion, fatigue, muscle pain and/or a headache. Thus, one should permit the individual to sleep. For the next few days, reassurance is essential. Being calm and helping reorient the person is also of importance.   Most seizures are painless and end spontaneously. Seizures are not harmful to others but can lead to complications such as stress on the lungs, brain and the heart. Individuals with prior lung problems may develop labored breathing and respiratory distress.

## 2024-01-19 NOTE — ED Notes (Signed)
Pt given a cup of ice water, saltine crackers, and peanut butter

## 2024-01-19 NOTE — ED Provider Notes (Signed)
Provider Note MRN:  [id number]  Arrival date & time: 01/19/24    ED Course and Medical Decision Making  Assumed care from Dr Donnald Garre at shift change.  See note from prior team for complete details, in brief:  53 yo male Craniotomy with resection of frontal glioblastoma; Dr. Maisie Fus, glioblastoma, 12/18/23 Keppra was stopped 1/13 Focal seizure pta, episode of pt facial/eye twitching, LUE LLE shaking. Was aware of symptoms but was not able to control them Dr Donnald Garre spoke w/ dr Otelia Limes; recommend restart keppra   Plan per prior physician f/u ct  CT reviewed; slightly increased size; 2-3 mm midline R> L shift slightly worsened. He is back to baseline. Will d/w NSGY given recent craniotomy and worsening abberation on CT tonight.   Spoke with NSGY, recommend load w/ decadron 10mg , start decadron 4mg  QID for home and Keppra 500mg  BID; f/u dr Barbaraann Cao  Pt feeling better, back to baseline. Was loaded w/ keppra in ED. Ambulatory w/ steady gait, given seizure precautions   The patient improved significantly and was discharged in stable condition. Detailed discussions were had with the patient/guardian regarding current findings, and need for close f/u with PCP or on call doctor. The patient/guardian has been instructed to return immediately if the symptoms worsen in any way for re-evaluation. Patient/guardian verbalized understanding and is in agreement with current care plan. All questions answered prior to discharge.     Procedures  Final Clinical Impressions(s) / ED Diagnoses     ICD-10-CM   1. Seizure (HCC)  R56.9     2. Vasogenic brain edema (HCC)  G93.6       ED Discharge Orders          Ordered    dexamethasone (DECADRON) 4 MG tablet  4 times daily        01/19/24 0348    levETIRAcetam (KEPPRA) 500 MG tablet  2 times daily        01/19/24 0348              Discharge Instructions      1.  Take Keppra 500 mg twice a day starting tomorrow morning. 2.  Call your  neurologist/oncologist tomorrow to schedule a follow-up as soon as possible 3.  Return to the emergency department immediately if you have any new or concerning symptoms.  No driving or any activities that could result in injury if you have a seizure.      Per [facility] statutes, patients with seizures are not allowed to drive until they have been seizure-free for six months.  Other recommendations include using caution when using heavy equipment or power tools. Avoid working on ladders or at heights. Take showers instead of baths.  Do not swim alone.  Ensure the water temperature is not too high on the home water heater. Do not go swimming alone. Do not lock yourself in a room alone (i.e. bathroom). When caring for infants or small children, sit down when holding, feeding, or changing them to minimize risk of injury to the child in the event you have a seizure. Maintain good sleep hygiene. Avoid alcohol.  Also recommend adequate sleep, hydration, good diet and minimize stress.     During the Seizure   - First, ensure adequate ventilation and place patients on the floor on their left side  Loosen clothing around the neck and ensure the airway is patent. If the patient is clenching the teeth, do not force the mouth open with any object as this can  cause severe damage - Remove all items from the surrounding that can be hazardous. The patient may be oblivious to what's happening and may not even know what he or she is doing. If the patient is confused and wandering, either gently guide him/her away and block access to outside areas - Reassure the individual and be comforting - Call 911. In most cases, the seizure ends before EMS arrives. However, there are cases when seizures may last over 3 to 5 minutes. Or the individual may have developed breathing difficulties or severe injuries. If a pregnant patient or a person with diabetes develops a seizure, it is prudent to call an ambulance. -  Finally, if the patient does not regain full consciousness, then call EMS. Most patients will remain confused for about 45 to 90 minutes after a seizure, so you must use judgment in calling for help. - Avoid restraints but make sure the patient is in a bed with padded side rails - Place the individual in a lateral position with the neck slightly flexed; this will help the saliva drain from the mouth and prevent the tongue from falling backward - Remove all nearby furniture and other hazards from the area - Provide verbal assurance as the individual is regaining consciousness - Provide the patient with privacy if possible - Call for help and start treatment as ordered by the caregiver   After the Seizure (Postictal Stage)   After a seizure, most patients experience confusion, fatigue, muscle pain and/or a headache. Thus, one should permit the individual to sleep. For the next few days, reassurance is essential. Being calm and helping reorient the person is also of importance.   Most seizures are painless and end spontaneously. Seizures are not harmful to others but can lead to complications such as stress on the lungs, brain and the heart. Individuals with prior lung problems may develop labored breathing and respiratory distress.          Sloan Leiter, DO 01/19/24 [phone]

## 2024-01-20 ENCOUNTER — Other Ambulatory Visit: Payer: Self-pay | Admitting: Physical Medicine and Rehabilitation

## 2024-01-22 ENCOUNTER — Other Ambulatory Visit: Payer: Self-pay

## 2024-01-22 ENCOUNTER — Ambulatory Visit
Admission: RE | Admit: 2024-01-22 | Discharge: 2024-01-22 | Disposition: A | Discharge status: Home / Self Care | Payer: Commercial Managed Care - PPO | Source: Ambulatory Visit | Attending: Radiation Oncology | Admitting: Radiation Oncology

## 2024-01-22 ENCOUNTER — Inpatient Hospital Stay (HOSPITAL_BASED_OUTPATIENT_CLINIC_OR_DEPARTMENT_OTHER): Payer: Commercial Managed Care - PPO | Admitting: Internal Medicine

## 2024-01-22 ENCOUNTER — Ambulatory Visit: Payer: Commercial Managed Care - PPO | Admitting: Physical Therapy

## 2024-01-22 ENCOUNTER — Ambulatory Visit: Payer: Commercial Managed Care - PPO | Admitting: Occupational Therapy

## 2024-01-22 VITALS — BP 135/84 | HR 85 | Temp 98.2°F | Resp 18 | Ht 74.0 in | Wt 271.8 lb

## 2024-01-22 DIAGNOSIS — Z510 Encounter for antineoplastic radiation therapy: Secondary | ICD-10-CM | POA: Diagnosis not present

## 2024-01-22 DIAGNOSIS — C711 Malignant neoplasm of frontal lobe: Secondary | ICD-10-CM

## 2024-01-22 DIAGNOSIS — M6281 Muscle weakness (generalized): Secondary | ICD-10-CM

## 2024-01-22 DIAGNOSIS — I69252 Hemiplegia and hemiparesis following other nontraumatic intracranial hemorrhage affecting left dominant side: Secondary | ICD-10-CM | POA: Diagnosis not present

## 2024-01-22 DIAGNOSIS — R278 Other lack of coordination: Secondary | ICD-10-CM

## 2024-01-22 DIAGNOSIS — R569 Unspecified convulsions: Secondary | ICD-10-CM | POA: Insufficient documentation

## 2024-01-22 DIAGNOSIS — R29818 Other symptoms and signs involving the nervous system: Secondary | ICD-10-CM

## 2024-01-22 LAB — RAD ONC ARIA SESSION SUMMARY
Course Elapsed Days: 0
Plan Fractions Treated to Date: 1
Plan Prescribed Dose Per Fraction: 2 Gy
Plan Total Fractions Prescribed: 23
Plan Total Prescribed Dose: 46 Gy
Reference Point Dosage Given to Date: 2 Gy
Reference Point Session Dosage Given: 2 Gy
Session Number: 1

## 2024-01-22 MED ORDER — DEXAMETHASONE 1 MG PO TABS: ORAL_TABLET | ORAL | 0 refills | Status: AC

## 2024-01-22 NOTE — Progress Notes (Signed)
[facility], [place] [address]  [place], [place] [address] [phone]   Interval Evaluation  Date of Service: 01/22/24 Patient Name: Nicholas Stevens Patient MRN: [id number] Patient DOB: [birth year] Provider: Henreitta Leber, MD  Identifying Statement:  Nicholas Stevens is a 53 y.o. male with right frontal glioblastoma    Oncologic History: Oncology History  Frontal glioblastoma (HCC)  12/18/2023 Surgery   Craniotomy, resection of right frontal mass with Dr. Maisie Fus; path is glioblastoma, IDH pending   01/22/2024 -  Chemotherapy   Patient is on Treatment Plan : BRAIN GLIOBLASTOMA Radiation Therapy With Concurrent Temozolomide 75 mg/m2 Daily Followed By Sequential Maintenance Temozolomide x 6-12 cycles       Biomarkers:  MGMT Unknown.  IDH 1/2 Unknown.  EGFR Unknown  TERT Unknown   Interval History: [name] presents for follow up after recent ED visit for new onset seizure.  Event was witnessed at home on 01/18/24, described as "several minutes of left sided shaking, head turned to left side".  This was followed by fairly quick return to baseline.  He was started on Keppra, decadron.  No further events since that date, he starts radiation therapy today.   H+P (01/08/24) Patient presented with several days of left sided clumsiness, impaired coordination.  Was found to have large right frontal mass, c/w primary brain tumor.  He underwent craniotomy, resection with Dr. Maisie Fus on 12/18/23; path demonstrated glioblastoma.  Following surgery, he had persistent right sided weakness, he is currently in outpatient PT following discharge from rehab.  No seizures, headaches.     Medications: Current Outpatient Medications on File Prior to Visit  Medication Sig Dispense Refill   apixaban (ELIQUIS) 5 MG TABS tablet Take 1 tablet (5 mg total) by mouth 2 (two) times daily. 60 tablet 0   camphor-menthol (SARNA) lotion Apply topically as  needed for itching. 222 mL 0   Cyanocobalamin (B-12 PO) Take 1 tablet by mouth daily.     dexamethasone (DECADRON) 4 MG tablet Take 1 tablet (4 mg total) by mouth 4 (four) times daily for 14 days. 56 tablet 0   fexofenadine (ALLEGRA) 60 MG tablet Take 1 tablet (60 mg total) by mouth 2 (two) times daily. 60 tablet 0   latanoprost (XALATAN) 0.005 % ophthalmic solution Place 1 drop into both eyes at bedtime.     levETIRAcetam (KEPPRA) 500 MG tablet Take 1 tablet (500 mg total) by mouth 2 (two) times daily. 60 tablet 0   LORazepam (ATIVAN) 1 MG tablet Take 0.5 tablets (0.5 mg total) by mouth every 8 (eight) hours. 45 tablet 0   Multiple Vitamin (MULTIVITAMIN WITH MINERALS) TABS tablet Take 1 tablet by mouth daily.     Omega-3 Fatty Acids (FISH OIL) 1000 MG CAPS Take 1,000 mg by mouth daily.     ondansetron (ZOFRAN) 8 MG tablet Take 1 tablet (8 mg total) by mouth every 8 (eight) hours as needed for nausea or vomiting. May take 30-60 minutes prior to Temodar administration if nausea/vomiting occurs as needed. 30 tablet 1   pantoprazole (PROTONIX) 40 MG tablet Take 1 tablet (40 mg total) by mouth daily. 30 tablet 0   pimecrolimus (ELIDEL) 1 % cream Apply 1 Application topically 2 (two) times daily as needed (Dermatitis).     senna-docusate (SENOKOT-S) 8.6-50 MG tablet Take 2 tablets by mouth daily at 6 (six) AM. 60 tablet 0   temozolomide (TEMODAR) 180 MG capsule Take 1 capsule (180 mg total)  by mouth daily. May take on an empty stomach to decrease nausea & vomiting. 42 capsule 0   triamcinolone cream (KENALOG) 0.1 % Apply 1 Application topically daily. Use on affected areas for 2 weeks, then stop for 2 weeks. Repeat until clear if needed. 453 g 0   zolpidem (AMBIEN) 10 MG tablet TAKE 1 TABLET BY MOUTH EVERYDAY AT BEDTIME 90 tablet 1   No current facility-administered medications on file prior to visit.    Allergies: No Known Allergies Past Medical History:  Past Medical History:  Diagnosis Date    DDD (degenerative disc disease), lumbar    HNP (herniated nucleus pulposus)    Hypertension    Liver hemangioma    Morbid obesity (HCC)    OSA (obstructive sleep apnea)    no cpap used since weight loss   Overweight(278.02)    Plantar fasciitis of right foot    Sleep apnea    Tinea barbae    Past Surgical History:  Past Surgical History:  Procedure Laterality Date   BREATH TEK H PYLORI N/A 08/20/2013   Procedure: BREATH TEK H PYLORI;  Surgeon: Valarie Merino, MD;  Location: [facility];  Service: General;  Laterality: N/A;   COLONOSCOPY WITH PROPOFOL N/A 05/05/2022   Procedure: COLONOSCOPY WITH PROPOFOL;  Surgeon: Beverley Fiedler, MD;  Location: [facility];  Service: Gastroenterology;  Laterality: N/A;   CRANIOTOMY Right 12/18/2023   Procedure: Right Frontal Stereotactic Craniotomy for Resection of Tumor;  Surgeon: Bedelia Person, MD;  Location: [facility] OR;  Service: Neurosurgery;  Laterality: Right;   ESOPHAGOGASTRODUODENOSCOPY (EGD) WITH PROPOFOL N/A 03/15/2023   Procedure: ESOPHAGOGASTRODUODENOSCOPY (EGD) WITH PROPOFOL;  Surgeon: Iva Boop, MD;  Location: [facility];  Service: Gastroenterology;  Laterality: N/A;   LAPAROSCOPIC APPENDECTOMY  2007   Dr Carolynne Edouard   LAPAROSCOPIC GASTRIC SLEEVE RESECTION N/A 10/08/2013   Procedure: LAPAROSCOPIC SLEEVE GASTRECTOMY;  Surgeon: Valarie Merino, MD;  Location: [facility];  Service: General;  Laterality: N/A;   LUMBAR LAMINECTOMY/DECOMPRESSION MICRODISCECTOMY N/A 09/16/2015   Procedure: MICRO LUMBAR DECOMPRESSION, MICRODISCECTOMY L5 - S1;  Surgeon: Jene Every, MD;  Location: [facility];  Service: Orthopedics;  Laterality: N/A;   POLYPECTOMY  05/05/2022   Procedure: POLYPECTOMY;  Surgeon: Beverley Fiedler, MD;  Location: [facility];  Service: Gastroenterology;;   ROTATOR CUFF REPAIR Right 2005   Dr Lajoyce Corners   Social History:  Social History   Socioeconomic History   Marital status: Married    Spouse name: Renel Ende   Number of children: 3    Years of education: Not on file   Highest education level: 12th grade  Occupational History   Occupation: Doctor, general practice PD    Employer: UNEMPLOYED  Tobacco Use   Smoking status: Never    Passive exposure: Never   Smokeless tobacco: Never  Vaping Use   Vaping status: Never Used  Substance and Sexual Activity   Alcohol use: No   Drug use: No   Sexual activity: Not on file  Other Topics Concern   Not on file  Social History Narrative   Married 18+ years   3 children - ages approx 32, 9 and 5...daughter age 64 w/ DM type 1   Social Drivers of Corporate investment banker Strain: Low Risk  (04/02/2023)   Overall Financial Resource Strain (CARDIA)    Difficulty of Paying Living Expenses: Not hard at all  Food Insecurity: No Food Insecurity (12/18/2023)   Hunger Vital Sign    Worried About Running Out  of Food in the Last Year: Never true    Ran Out of Food in the Last Year: Never true  Transportation Needs: No Transportation Needs (12/18/2023)   PRAPARE - Administrator, Civil Service (Medical): No    Lack of Transportation (Non-Medical): No  Physical Activity: Insufficiently Active (04/02/2023)   Exercise Vital Sign    Days of Exercise per Week: 4 days    Minutes of Exercise per Session: 30 min  Stress: No Stress Concern Present (04/02/2023)   [employer] of Occupational Health - Occupational Stress Questionnaire    Feeling of Stress : Not at all  Social Connections: Socially Integrated (04/02/2023)   Social Connection and Isolation Panel [NHANES]    Frequency of Communication with Friends and Family: More than three times a week    Frequency of Social Gatherings with Friends and Family: Once a week    Attends Religious Services: More than 4 times per year    Active Member of [employer] or Organizations: Yes    Attends Engineer, structural: More than 4 times per year    Marital Status: Married  Catering manager Violence: Not At Risk (12/18/2023)   Humiliation, Afraid,  Rape, and Kick questionnaire    Fear of Current or Ex-Partner: No    Emotionally Abused: No    Physically Abused: No    Sexually Abused: No   Family History:  Family History  Problem Relation Age of Onset   Other Mother        hx of DJD   Liver disease Father        ETOH, Hep C   Hypertension Father    Colon cancer Neg Hx    Colon polyps Neg Hx    Esophageal cancer Neg Hx    Rectal cancer Neg Hx    Stomach cancer Neg Hx     Review of Systems: Constitutional: Doesn't report fevers, chills or abnormal weight loss Eyes: Doesn't report blurriness of vision Ears, nose, mouth, throat, and face: Doesn't report sore throat Respiratory: Doesn't report cough, dyspnea or wheezes Cardiovascular: Doesn't report palpitation, chest discomfort  Gastrointestinal:  Doesn't report nausea, constipation, diarrhea GU: Doesn't report incontinence Skin: Doesn't report skin rashes Neurological: Per HPI Musculoskeletal: Doesn't report joint pain Behavioral/Psych: Doesn't report anxiety  Physical Exam: Vitals:   01/22/24 1141  BP: 135/84  Pulse: 85  Resp: 18  Temp: 98.2 F (36.8 C)  SpO2: 97%   KPS: 80. General: Alert, cooperative, pleasant, in no acute distress Head: Normal EENT: No conjunctival injection or scleral icterus.  Lungs: Resp effort normal Cardiac: Regular rate Abdomen: Non-distended abdomen Skin: No rashes cyanosis or petechiae. Extremities: No clubbing or edema  Neurologic Exam: Mental Status: Awake, alert, attentive to examiner. Oriented to self and environment. Language is fluent with intact comprehension.  Cranial Nerves: Visual acuity is grossly normal. Visual fields are full. Extra-ocular movements intact. No ptosis. Face is symmetric Motor: Tone and bulk are normal. Power is 4+/5 in left arm and leg. Reflexes are symmetric, no pathologic reflexes present.  Sensory: Intact to light touch Gait: Normal.   Labs: I have reviewed the data as listed    Component  Value Date/Time   NA 137 01/18/2024 2047   K 4.2 01/18/2024 2047   CL 102 01/18/2024 2047   CO2 24 01/18/2024 2047   GLUCOSE 92 01/18/2024 2047   BUN 11 01/18/2024 2047   CREATININE 0.97 01/18/2024 2047   CALCIUM 9.4 01/18/2024 2047   PROT  5.5 (L) 12/27/2023 0634   ALBUMIN 2.6 (L) 12/27/2023 0634   AST 21 12/27/2023 0634   ALT 44 12/27/2023 0634   ALKPHOS 38 12/27/2023 0634   BILITOT 0.8 12/27/2023 0634   GFRNONAA >60 01/18/2024 2047   GFRAA >60 09/15/2015 1520   Lab Results  Component Value Date   WBC 6.4 01/18/2024   NEUTROABS 7.7 12/27/2023   HGB 17.4 (H) 01/18/2024   HCT 54.0 (H) 01/18/2024   MCV 85.2 01/18/2024   PLT 302 01/18/2024    Imaging:  CT Head Wo Contrast Result Date: 01/19/2024 CLINICAL DATA:  Initial evaluation for acute seizure. History of glioblastoma. EXAM: CT HEAD WITHOUT CONTRAST TECHNIQUE: Contiguous axial images were obtained from the base of the skull through the vertex without intravenous contrast. RADIATION DOSE REDUCTION: This exam was performed according to the departmental dose-optimization program which includes automated exposure control, adjustment of the mA and/or kV according to patient size and/or use of iterative reconstruction technique. COMPARISON:  Prior MRI from 01/12/2024. FINDINGS: Brain: Postoperative changes from prior right frontal craniotomy. Patient's known glioma involving the right frontal lobe again seen, measuring approximately 4.0 x 3.4 x 2.1 cm. This measures increased in size from recent MRI, although exact comparison is somewhat difficult given different modalities. Surrounding hypodensity and vasogenic edema appears similar to perhaps slightly worsened. Associated mass effect with 2-3 mm right-to-left shift, worsened from recent MRI. Scattered hyperdensity within the lesion consistent with blood products and/or necrosis, also seen on prior. No other acute intracranial hemorrhage or large vessel territory infarct. No other mass  lesion. No hydrocephalus or trapping. No extra-axial fluid collection. Vascular: No asymmetric hyperdense vessel. Skull: Scalp soft tissues demonstrate no acute finding. Prior right frontal craniotomy. Sinuses/Orbits: Globes orbital soft tissues within normal limits. Paranasal sinuses are largely clear. No mastoid effusion. Other: None. IMPRESSION: 1. Patient's known glioma involving the right frontal lobe measures 4.0 x 3.4 x 2.1 cm, increased in size from recent MRI, although exact comparison is somewhat difficult given different modalities. Surrounding hypodensity and vasogenic edema appears similar to perhaps slightly worsened. Associated mass effect with 2-3 mm right-to-left shift, worsened from recent MRI. 2. No other acute intracranial abnormality. Electronically Signed   By: Rise Mu M.D.   On: 01/19/2024 02:07   MR Brain W Wo Contrast Result Date: 01/12/2024 CLINICAL DATA:  Staging of glioblastoma EXAM: MRI HEAD WITHOUT AND WITH CONTRAST TECHNIQUE: Multiplanar, multiecho pulse sequences of the brain and surrounding structures were obtained without and with intravenous contrast. CONTRAST:  8mL GADAVIST GADOBUTROL 1 MMOL/ML IV SOLN COMPARISON:  12/19/2023 FINDINGS: Brain: No acute infarct. Chronic blood products at the site of the right frontal lobe mass. Compared to 12/19/2023, there is increased peripheral nodular contrast enhancement at the right frontal operative site. The central cavity has contracted. The mass now measures 3.7 x 3.0 cm, previously 3.2 x 2.8 cm (sagittal image 13). The degree of surrounding hyperintense T2-weighted signal is approximately unchanged. The previously demonstrated midline shift is decreased/resolved. No new areas of contrast enhancement. Vascular: Normal flow voids. Skull and upper cervical spine: Remote right frontal craniotomy. Sinuses/Orbits:No paranasal sinus fluid levels or advanced mucosal thickening. No mastoid or middle ear effusion. Normal orbits.  IMPRESSION: 1. Increased peripheral nodular contrast enhancement at the right frontal operative site with contraction of the central cavity. The mass now measures 3.7 x 3.0 cm, previously 3.2 x 2.8 cm. Findings are most consistent with progressive disease. 2. Decreased/resolved midline shift. Electronically Signed   By: Deatra Robinson  M.D.   On: 01/12/2024 22:51   CT HEAD WO CONTRAST ( ) Result Date: 12/26/2023 CLINICAL DATA:  Brain/CNS neoplasm, monitor. EXAM: CT HEAD WITHOUT CONTRAST TECHNIQUE: Contiguous axial images were obtained from the base of the skull through the vertex without intravenous contrast. RADIATION DOSE REDUCTION: This exam was performed according to the departmental dose-optimization program which includes automated exposure control, adjustment of the mA and/or kV according to patient size and/or use of iterative reconstruction technique. COMPARISON:  Head CT 12/15/2023.  MRI brain 12/19/2023. FINDINGS: Brain: Postoperative changes of right frontal craniotomy for resection of a mass centered within the right superior frontal gyrus. Expected small amount of blood products in the resection cavity. Similar extent of surrounding vasogenic edema with partial effacement of the right lateral ventricle. No hydrocephalus, extra-axial collection or midline shift. Vascular: No hyperdense vessel or unexpected calcification. Skull: Right frontal craniotomy. Sinuses/Orbits: No acute finding. Other: None. IMPRESSION: Postoperative changes of right frontal craniotomy for resection of a mass centered within the right superior frontal gyrus. Expected small amount of blood products in the resection cavity. Similar extent of surrounding vasogenic edema with partial effacement of the right lateral ventricle. Electronically Signed   By: Orvan Falconer M.D.   On: 12/26/2023 09:13   VAS [place] LOWER EXTREMITY VENOUS (DVT) Result Date: 12/24/2023  Lower Venous DVT Study Patient Name:  LAVAN IMES  Date of  Exam:   12/23/2023 Medical Rec #: [id number]            Accession #:    [id number] Date of Birth: 1971-02-28           Patient Gender: M Patient Age:   35 years Exam Location:  [facility] Procedure:      VAS [place] LOWER EXTREMITY VENOUS (DVT) Referring Phys: [facility] BERGMAN --------------------------------------------------------------------------------  Indications: Pain, and Inability to walk due to pain.  Comparison Study: No prior exam. Performing Technologist: Fernande Bras  Examination Guidelines: A complete evaluation includes B-mode imaging, spectral Doppler, color Doppler, and power Doppler as needed of all accessible portions of each vessel. Bilateral testing is considered an integral part of a complete examination. Limited examinations for reoccurring indications may be performed as noted. The reflux portion of the exam is performed with the patient in reverse Trendelenburg.  +-----+---------------+---------+-----------+----------+--------------+ RIGHTCompressibilityPhasicitySpontaneityPropertiesThrombus Aging +-----+---------------+---------+-----------+----------+--------------+ CFV  Full           Yes      Yes                                 +-----+---------------+---------+-----------+----------+--------------+ SFJ  Full           Yes      Yes                                 +-----+---------------+---------+-----------+----------+--------------+   +---------+---------------+---------+-----------+----------+--------------+ LEFT     CompressibilityPhasicitySpontaneityPropertiesThrombus Aging +---------+---------------+---------+-----------+----------+--------------+ CFV      Full           Yes      Yes                                 +---------+---------------+---------+-----------+----------+--------------+ SFJ      Full           Yes      Yes                                 +---------+---------------+---------+-----------+----------+--------------+  FV Prox  Full                                                        +---------+---------------+---------+-----------+----------+--------------+ FV Mid   Full                                                        +---------+---------------+---------+-----------+----------+--------------+ FV DistalFull                                                        +---------+---------------+---------+-----------+----------+--------------+ PFV      Full                                                        +---------+---------------+---------+-----------+----------+--------------+ POP      Full           Yes      Yes                                 +---------+---------------+---------+-----------+----------+--------------+ PTV      Full                                                        +---------+---------------+---------+-----------+----------+--------------+ PERO     None           No       No                                  +---------+---------------+---------+-----------+----------+--------------+     Summary: RIGHT: - No evidence of common femoral vein obstruction.   LEFT: - Findings consistent with acute deep vein thrombosis involving the left peroneal veins.  - No cystic structure found in the popliteal fossa.  *See table(s) above for measurements and observations. Electronically signed by Heath Lark on 12/24/2023 at 12:31:39 PM.    Final      Assessment/Plan Frontal glioblastoma (HCC)  Focal seizure (HCC)  Mazin W Noy is clinically improved following new onset focal seizure 2/2 glioblastoma.    Recommended continuing Keppra 500mg  BID as AED, well tolerated thus far.  Recommended decreasing decadron by 1mg  each week, starting with 3mg  daily tomorrow.  Dose may be modified if focal symptoms recur.  We ultimately recommended proceeding with course of intensity modulated radiation therapy and concurrent daily Temozolomide.  Radiation will  be administered Mon-Fri over 6 weeks, Temodar will be dosed at 75mg /m2 to be given daily over 42 days.  We reviewed side effects of temodar, including fatigue, nausea/vomiting, constipation, and cytopenias.  Informed consent  was verbally obtained at bedside to proceed with oral chemotherapy.  Chemotherapy should be held for the following:  ANC less than 1,000  Platelets less than 100,000  LFT or creatinine greater than 2x ULN  If clinical concerns/contraindications develop  Every 2 weeks during radiation, labs will be checked accompanied by a clinical evaluation in the brain tumor clinic.  All questions were answered. The patient knows to call the clinic with any problems, questions or concerns. No barriers to learning were detected.  The total time spent in the encounter was 30 minutes and more than 50% was on counseling and review of test results   Henreitta Leber, MD Medical Director of Neuro-Oncology [facility] 01/22/24 11:50 AM

## 2024-01-22 NOTE — Patient Instructions (Signed)
Access Code: [email or web address] URL: [email or web address] Date: 01/22/2024 Prepared by: Amada Kingfisher  Exercises - Putty Squeezes  - 1-2 x daily - 10 reps - Rolling Putty on Table  - 1-2 x daily - 10 reps - Finger Pinch and Pull with Putty  - 1-2 x daily - 10 reps - 3-Point Pinch with Putty  - 1-2 x daily - 10 reps - Tip PUSH with Putty  - 1-2 x daily - 10 reps - Key Pinch with Putty  - 1-2 x daily - 10 reps - Finger Extension with Putty  - 1-2 x daily - 10 reps - Finger Adduction with Putty  - 1-2 x daily - 10 reps - Removing Marbles from Putty  - 1-2 x daily - 10 reps

## 2024-01-22 NOTE — Therapy (Signed)
OUTPATIENT OCCUPATIONAL THERAPY NEURO TREATMENT  Patient Name: Nicholas Stevens MRN: [id number] DOB:April 15, 1971, 53 y.o., male Today's Date: 01/22/2024  PCP: Tresa Garter, MD REFERRING PROVIDER: Charlton Amor, PA-C   END OF SESSION:  OT End of Session - 01/22/24 0925     Visit Number 4    Number of Visits 16    Date for OT Re-Evaluation 03/22/24    Authorization Type [employer]    OT Start Time 0930    OT Stop Time 1015    OT Time Calculation (min) 45 min    Equipment Utilized During Treatment Dynamometer, Green/Blue Putty    Activity Tolerance Patient tolerated treatment well    Behavior During Therapy WFL for tasks assessed/performed               Past Medical History:  Diagnosis Date   DDD (degenerative disc disease), lumbar    HNP (herniated nucleus pulposus)    Hypertension    Liver hemangioma    Morbid obesity (HCC)    OSA (obstructive sleep apnea)    no cpap used since weight loss   Overweight(278.02)    Plantar fasciitis of right foot    Sleep apnea    Tinea barbae    Past Surgical History:  Procedure Laterality Date   BREATH TEK H PYLORI N/A 08/20/2013   Procedure: BREATH TEK H PYLORI;  Surgeon: Valarie Merino, MD;  Location: [facility];  Service: General;  Laterality: N/A;   COLONOSCOPY WITH PROPOFOL N/A 05/05/2022   Procedure: COLONOSCOPY WITH PROPOFOL;  Surgeon: Beverley Fiedler, MD;  Location: [facility];  Service: Gastroenterology;  Laterality: N/A;   CRANIOTOMY Right 12/18/2023   Procedure: Right Frontal Stereotactic Craniotomy for Resection of Tumor;  Surgeon: Bedelia Person, MD;  Location: [facility] OR;  Service: Neurosurgery;  Laterality: Right;   ESOPHAGOGASTRODUODENOSCOPY (EGD) WITH PROPOFOL N/A 03/15/2023   Procedure: ESOPHAGOGASTRODUODENOSCOPY (EGD) WITH PROPOFOL;  Surgeon: Iva Boop, MD;  Location: [facility];  Service: Gastroenterology;  Laterality: N/A;   LAPAROSCOPIC APPENDECTOMY  2007   Dr Carolynne Edouard    LAPAROSCOPIC GASTRIC SLEEVE RESECTION N/A 10/08/2013   Procedure: LAPAROSCOPIC SLEEVE GASTRECTOMY;  Surgeon: Valarie Merino, MD;  Location: [facility];  Service: General;  Laterality: N/A;   LUMBAR LAMINECTOMY/DECOMPRESSION MICRODISCECTOMY N/A 09/16/2015   Procedure: MICRO LUMBAR DECOMPRESSION, MICRODISCECTOMY L5 - S1;  Surgeon: Jene Every, MD;  Location: [facility];  Service: Orthopedics;  Laterality: N/A;   POLYPECTOMY  05/05/2022   Procedure: POLYPECTOMY;  Surgeon: Beverley Fiedler, MD;  Location: [facility];  Service: Gastroenterology;;   ROTATOR CUFF REPAIR Right 2005   Dr Lajoyce Corners   Patient Active Problem List   Diagnosis Date Noted   Allergic drug rash 12/29/2023   DVT, lower extremity, distal (HCC) 12/29/2023   Frontal glioblastoma (HCC) 12/26/2023   Brain mass 12/15/2023   Pseudofolliculitis barbae 05/01/2023   Iron deficiency 04/06/2023   Abdominal pain 03/15/2023   Microcytosis 03/14/2023   RUQ pain 03/12/2023   Abnormal EKG 03/12/2023   Chest pain 03/12/2023   Adult acne 01/19/2023   Obesity (BMI 30-39.9) 01/19/2023   Benign neoplasm of cecum    Benign neoplasm of ascending colon    Benign neoplasm of transverse colon    Difficult airway for intubation 01/18/2022   Upper respiratory disease 01/31/2019   Cerumen impaction 07/30/2018   HTN (hypertension) 11/03/2017   Spinal stenosis of lumbar region 09/16/2015   Insomnia 04/15/2015   Well adult exam 11/04/2014   H/O gastric  sleeve 10/08/2013   Hypogonadism, male 07/17/2013   Erectile dysfunction 03/22/2013   Concentration deficit 04/27/2011   Attention or concentration deficit 04/27/2011   TINEA BARBAE 03/04/2009   LIVER HEMANGIOMA 03/04/2009   OVERWEIGHT-BMI 42 03/04/2009   OSA on CPAP 03/04/2009   DEGENERATIVE DISC DISEASE, LUMBAR SPINE 03/04/2009    ONSET DATE: 12/27/22 (referral date)  REFERRING DIAG: C71.9 (ICD-10-CM) - Malignant neoplasm of brain, unspecified   THERAPY DIAG:  Muscle weakness  (generalized)  Other lack of coordination  Other symptoms and signs involving the nervous system  Rationale for Evaluation and Treatment: Rehabilitation  SUBJECTIVE:   SUBJECTIVE STATEMENT:  Pt had a seizure last week -- his L arm and leg started jumping and he couldn't stop it and in addition, although he could hear his wife talking to him, he couldn't verbally respond to her for x6 mins.  His wife called 911 and various tests were run in the ER with seizure medication resumed.  He has daily cancer treatments beginning today.  Pt reports no pain but felt his L leg was weaker.   Pt accompanied by: self and significant other - Nicholas Stevens   PERTINENT HISTORY:  PMH includes: HTN, obesity (gastric sleeve), OSA, lumbar DDD.   53 yo male presenting 12/20 with decreased response time while driving, incoordination, imbalance. Pt was admitted to [facility] hospital from ED on 12/15/23;  Imaging showed necrotic and hemorrhagic mass in the superior right frontal lobe with features of glioblastoma with mass effect. underwent craniotomy for brain tumor (with features of gliobastoma per chart note) on 12/18/23;  pt was transferred to inpatient rehab on 12/26/23 and discharged home with wife on 12/31/23.     Per 12/27/23 acute OT evaluation: "history of OSA, HTN, obesity s/p gastric sleeve, liver hemangioma who was admitted on 12/15/23 with reports of decrease in coordination, minimal use of LUE/LLE and fall prompting treatment. MRI brain done revealing necrotic, hemorrhagic mass in superior right frontal lobe with features of glioblastoma and mass effect with very early right uncal herniation.  He underwent right frontal stereotactic  craniotomy for resection of frontal lobe tumor on 12/23 by Dr. Maisie Fus.  Post op has had improvement in LUE strength and follow up MRI showed good debulking of tumor with decrease in mass effect, collapse of lesion with region of enhancement  and 5 mm right to left shift. Pathology revealed  high grade glioma grade 4 c/w glioblastoma."  PRECAUTIONS: Fall and Other: No driving  WEIGHT BEARING RESTRICTIONS: No  PAIN:  Are you having pain? No  FALLS: Has patient fallen in last 6 months? Yes. Number of falls 1   Pt reported he was working the day of the incident and noticed his reaction time was slow.  He just thought his cold medincine was making him slow, but he ended up falling into the nightstand and wall and couldn't get himself up due to L UE/LE weakness prompting ED visit and hospitalization.  LIVING ENVIRONMENT: Lives with: lives with their family, lives with their spouse (high school sweethearts), and lives with youngest 83 yo son - 1 of 3 children) Lives in: House Stairs: Yes: Internal: 14  steps; can reach both and External: 1 threshold  steps; none  Master bedroom on main level  - has not been up steps to 2nd level of home yet as only been home for 2 days  Has following equipment: has built in seat in walk-in shower but not using it   PLOF: Independent - has a  home gym, worked in [employer] and had driven to/from [facility] on the day of the incident  PATIENT GOALS: To improve my strength and balance.  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  HAND DOMINANCE: Right  ADLs: Overall ADLs: Pt has resumed all ADLs on his own, even standing in shower, managing fasteners although they may take a little longer.  IADLs: Shopping: Goes out with wife but unable to drive Light housekeeping: Wife isn't currently letting him Meal Prep: Wife is still doing meal prep although he was primary with this task previously Community mobility: Ind Medication management: Wife has it set up for him Financial management:  Assisting wife, able to recall account numbers etc Handwriting: 100% legible - no change R UE unaffected  MOBILITY STATUS: Independent  POSTURE COMMENTS:  forward head Sitting balance: WNL  ACTIVITY TOLERANCE: Activity tolerance: limited  FUNCTIONAL OUTCOME MEASURES: Lawton-Brody IADL Scale: 3/8    UPPER EXTREMITY ROM:    Active ROM Right eval Left eval  Shoulder flexion WNL Slight end range limitations  Shoulder abduction    Shoulder adduction    Shoulder extension    Shoulder internal rotation    Shoulder external rotation    Elbow flexion    Elbow extension    Wrist flexion    Wrist extension    Wrist ulnar deviation    Wrist radial deviation    Wrist pronation    Wrist supination    (Blank rows = not tested)  UPPER EXTREMITY MMT:     MMT Right eval Left eval  Shoulder flexion 5/5 4/5  Shoulder abduction    Shoulder adduction    Shoulder extension    Shoulder internal rotation    Shoulder external rotation    Middle trapezius    Lower trapezius    Elbow flexion    Elbow extension    Wrist flexion    Wrist extension    Wrist ulnar deviation    Wrist radial deviation    Wrist pronation    Wrist supination    (Blank rows = not tested)  HAND FUNCTION: Grip strength: Right: 60.4 - 1st attempt not included, 82.2, 86.1, 106.0  lbs; Left: 51.3, 54.8, 52.6 lbs Average: Right: 91.4 lbs Left: 52.9 lbs   01/22/24 -  Right , 96, 88, 99 Left 45, 47, 66, 47, 64  Average Right 94.3 lbs Left: 53.8 lbs  COORDINATION: 9 Hole Peg test: Right: 19.15 sec; Left: 32.18 sec Box and Blocks:  Right 51 blocks, Left 37 blocks  01/22/24 - 9 hole peg test - 23.05 sec Box and Blocks test - 53 blocks  SENSATION: WFL  EDEMA: NA  MUSCLE TONE: WFL  COGNITION: Overall cognitive status: Within functional limits for tasks assessed Recalled account number to bills independently VISION: Subjective report: Drops for glaucoma prevention/keep pressure down Baseline vision:  Wears glasses for reading only Visual history:  NA  VISION ASSESSMENT: WFL  Patient has difficulty with following activities due to following visual impairments: NA  PERCEPTION: Not tested  PRAXIS: Not tested  OBSERVATIONS at eval: Pt ambulated with use of no AE and no loss of balance but with mildly decreased L arm swing and what OTR still noted as a slight  favoring of R side with gaze. The pt is well kept and was accompanied by his high school sweetheart/wife who did admit to overdoing it when helping him at times when pointed out by OTR.                                                                                                                            TREATMENT DATE:   TherAct Initiated Putty Activities with green and blue putty to progress strengthening, coordination and sensory stimulation of L UE.  Patient provided visual demonstration, verbal and tactile cues as needed to improve performance of the various exercises/activities including:   - Putty Squeezes - cues to squeeze putty into log for use with other exercises and to fold putty in half with 1 hand  - Putty Rolls - encourage to roll putty into logs with sensory stimulation to entire length of hand, fingers and wrist as needed   - Pinch and Pull with Putty - this motion is combined with different pinches (3-Point Pinch, Tip Pinch, Key Pinch) - patient encouraged to combine tripod, pincer and/or key pinch with "pinch and pull" motion of putty pulling away from midline, changing between different pinches and changing different directions to change grip   -Finger Extension with Putty - pt shown how to work on task with all fingers and thumb as well as individual fingers in opposition to thumb  -Finger Adduction with Putty - pt shown how to weave putty strip between his fingers to work on individual finger movements and then squeeze fingers together to squeeze the putty   - Removing Objects from Putty  - encouraged to hide  items (coins, marble, dice etc) and use one hand at a time to find the objects and identify them by tactile input before s/he digs them out and can see them visually.  OT educated patient on theraputty recommendations: avoid hot environments, place in designated container, avoid contact with fabrics. Patient verbalized understanding.    Patient benefited from extra time, verbal/tactile cues, and modeling of task to allow time for processing of verbal instructions and improve motor planning of movements for slow controlled performance and minimize shaking of LUE.   PATIENT EDUCATION: Education details: Putty exercises/activities Person educated: Patient and Spouse Education method: Explanation, Demonstration, Verbal cues, and Handouts Education comprehension: verbalized understanding, returned demonstration, and needs further education  HOME EXERCISE PROGRAM: 01/11/24 - FM coordination HEP 01/12/24 - BUE strengthening Theraband - Access Code: [facility] 01/22/24 - Putty Exercises/activities - Access Code: [email or web address]  GOALS: Goals reviewed with patient? Yes   LONG TERM GOALS: Target date: 02/02/24  Patient will demonstrate initial LUE strength/coordination HEP with visual handouts only for proper execution to resume use of home gym including [employer] etc. Baseline: New to outpt OT Goal status: MET  2.  Patient will demonstrate at least 70+ lbs LUE grip strength as needed to open jars and other containers. Baseline: 52.9 lbs Goal status: IN Progress 01/22/24:  Left: 53.8 lbs (greatest squeeze 66 lbs)  3.  Patient will demo improved FM coordination as evidenced by completing nine-hole peg with use of LUE in 25 seconds or less. Baseline: 32.18 Goal status: MET 01/22/24: Left 23.05 sec  4.  Pt will be able to place at least 42+ blocks using left hand with completion of Box and Blocks test. Baseline: 37 blocks Goal status: MET 01/22/24: 53 blocks  5.  Patient will demonstrate at least 95%  accuracy with environmental visual scanning in distracting environment to assist with ADLs and IADLS including eventual driving considerations and work-related tasks as Hydrographic surveyor.   Baseline: New to outpt OT Goal status: IN Progress  6.  Pt will have adequate standing tolerance and balance to safely complete IADLS without supervision including preparing meals per PLOF. Baseline: Spouse is preparing meals. Brody-Lawton Scale 3/8 Goal status: MET 01/22/24: Spouse and wife reports he is making meals again.  7. Patient will demo improved BUE FM coordination as evidenced by independence with sorting medication on his own without dropping pills.  Baseline: Wife is assisting him Goal status: MET  ASSESSMENT:  CLINICAL IMPRESSION: Patient is a 53 y.o. male who was seen today for occupational therapy treatment for residual L UE deficits s/p glioblastoma and craniotomy. Patient currently presents with significant improvement in baseline level of function and has resumed some cooking and cleaning activities.  He did have a seizure this past week but has resumed his seizure medication and is planning to discuss driving with his doctor today.  Pt will benefit from a few more skilled OT services in the outpatient setting to work on strength and motor control of LUE to help pt return to PLOF as able.   [name]   PERFORMANCE DEFICITS: in functional skills including IADLs, coordination, dexterity, proprioception, ROM, strength, Fine motor control, Gross motor control, balance, endurance, vision, and UE functional use, cognitive skills including attention, and psychosocial skills including coping strategies and routines and behaviors.   IMPAIRMENTS: are limiting patient from IADLs, work, leisure, and social participation.   CO-MORBIDITIES: may have co-morbidities  that affects occupational performance. Patient will benefit from skilled OT to address above impairments and improve overall function.  REHAB  POTENTIAL: Excellent   PLAN:  OT FREQUENCY: 1-2x/week  OT DURATION: up to 6 weeks but planning for 4 weeks  PLANNED INTERVENTIONS: 97535 self care/ADL training, 16109 therapeutic exercise, 97530 therapeutic activity, 97112 neuromuscular re-education, functional mobility training, coping strategies training, and patient/family education  RECOMMENDED OTHER SERVICES: PT evaluation occurred date of eval also  CONSULTED AND AGREED WITH PLAN OF CARE: Patient and family member/caregiver  PLAN FOR NEXT SESSION:  Strengthening HEP - progress HEP as tolerated Body blade - LUE especially Coordination activities - progress HEP as tolerated Scanning with distractions/Reaction times - Blaze Pods Standing balance and activity tolerance for cooking   Victorino Sparrow, OT 01/22/2024, 12:10 PM

## 2024-01-23 ENCOUNTER — Encounter: Payer: Self-pay | Admitting: Physical Therapy

## 2024-01-23 ENCOUNTER — Ambulatory Visit
Admission: RE | Admit: 2024-01-23 | Discharge: 2024-01-23 | Disposition: A | Discharge status: Home / Self Care | Payer: Commercial Managed Care - PPO | Source: Ambulatory Visit | Attending: Radiation Oncology | Admitting: Radiation Oncology

## 2024-01-23 ENCOUNTER — Other Ambulatory Visit: Payer: Self-pay

## 2024-01-23 DIAGNOSIS — Z510 Encounter for antineoplastic radiation therapy: Secondary | ICD-10-CM | POA: Diagnosis not present

## 2024-01-23 LAB — RAD ONC ARIA SESSION SUMMARY
Course Elapsed Days: 1
Plan Fractions Treated to Date: 2
Plan Prescribed Dose Per Fraction: 2 Gy
Plan Total Fractions Prescribed: 23
Plan Total Prescribed Dose: 46 Gy
Reference Point Dosage Given to Date: 4 Gy
Reference Point Session Dosage Given: 2 Gy
Session Number: 2

## 2024-01-23 NOTE — Therapy (Signed)
OUTPATIENT PHYSICAL THERAPY NEURO TREATMENT NOTE   Patient Name: Nicholas Stevens MRN: [id number] DOB:30-Oct-1971, 53 y.o., male Today's Date: 01/23/2024   PCP: Jenelle Mages., MD REFERRING PROVIDER: Charlton Amor, PA-C  END OF SESSION:  PT End of Session - 01/23/24 1134     Visit Number 7    Number of Visits 13    Date for PT Re-Evaluation 02/02/24    Authorization Type [employer]    Authorization Time Period 01-02-24 - 03-01-24    PT Start Time 1017    PT Stop Time 1055    PT Time Calculation (min) 38 min    Equipment Utilized During Treatment --    Activity Tolerance Patient tolerated treatment well    Behavior During Therapy WFL for tasks assessed/performed               Past Medical History:  Diagnosis Date   DDD (degenerative disc disease), lumbar    HNP (herniated nucleus pulposus)    Hypertension    Liver hemangioma    Morbid obesity (HCC)    OSA (obstructive sleep apnea)    no cpap used since weight loss   Overweight(278.02)    Plantar fasciitis of right foot    Sleep apnea    Tinea barbae    Past Surgical History:  Procedure Laterality Date   BREATH TEK H PYLORI N/A 08/20/2013   Procedure: BREATH TEK Richardo Priest;  Surgeon: Valarie Merino, MD;  Location: [facility];  Service: General;  Laterality: N/A;   COLONOSCOPY WITH PROPOFOL N/A 05/05/2022   Procedure: COLONOSCOPY WITH PROPOFOL;  Surgeon: Beverley Fiedler, MD;  Location: [facility];  Service: Gastroenterology;  Laterality: N/A;   CRANIOTOMY Right 12/18/2023   Procedure: Right Frontal Stereotactic Craniotomy for Resection of Tumor;  Surgeon: Bedelia Person, MD;  Location: [facility];  Service: Neurosurgery;  Laterality: Right;   ESOPHAGOGASTRODUODENOSCOPY (EGD) WITH PROPOFOL N/A 03/15/2023   Procedure: ESOPHAGOGASTRODUODENOSCOPY (EGD) WITH PROPOFOL;  Surgeon: Iva Boop, MD;  Location: [facility];  Service: Gastroenterology;  Laterality: N/A;   LAPAROSCOPIC APPENDECTOMY  2007   Dr Carolynne Edouard    LAPAROSCOPIC GASTRIC SLEEVE RESECTION N/A 10/08/2013   Procedure: LAPAROSCOPIC SLEEVE GASTRECTOMY;  Surgeon: Valarie Merino, MD;  Location: [facility];  Service: General;  Laterality: N/A;   LUMBAR LAMINECTOMY/DECOMPRESSION MICRODISCECTOMY N/A 09/16/2015   Procedure: MICRO LUMBAR DECOMPRESSION, MICRODISCECTOMY L5 - S1;  Surgeon: Jene Every, MD;  Location: [facility];  Service: Orthopedics;  Laterality: N/A;   POLYPECTOMY  05/05/2022   Procedure: POLYPECTOMY;  Surgeon: Beverley Fiedler, MD;  Location: [facility];  Service: Gastroenterology;;   ROTATOR CUFF REPAIR Right 2005   Dr Lajoyce Corners   Patient Active Problem List   Diagnosis Date Noted   Focal seizure (HCC) 01/22/2024   Allergic drug rash 12/29/2023   DVT, lower extremity, distal (HCC) 12/29/2023   Frontal glioblastoma (HCC) 12/26/2023   Brain mass 12/15/2023   Pseudofolliculitis barbae 05/01/2023   Iron deficiency 04/06/2023   Abdominal pain 03/15/2023   Microcytosis 03/14/2023   RUQ pain 03/12/2023   Abnormal EKG 03/12/2023   Chest pain 03/12/2023   Adult acne 01/19/2023   Obesity (BMI 30-39.9) 01/19/2023   Benign neoplasm of cecum    Benign neoplasm of ascending colon    Benign neoplasm of transverse colon    Difficult airway for intubation 01/18/2022   Upper respiratory disease 01/31/2019   Cerumen impaction 07/30/2018   HTN (hypertension) 11/03/2017   Spinal stenosis of lumbar region 09/16/2015  Insomnia 04/15/2015   Well adult exam 11/04/2014   H/O gastric sleeve 10/08/2013   Hypogonadism, male 07/17/2013   Erectile dysfunction 03/22/2013   Concentration deficit 04/27/2011   Attention or concentration deficit 04/27/2011   TINEA BARBAE 03/04/2009   LIVER HEMANGIOMA 03/04/2009   OVERWEIGHT-BMI 42 03/04/2009   OSA on CPAP 03/04/2009   DEGENERATIVE DISC DISEASE, LUMBAR SPINE 03/04/2009    ONSET DATE: 12-15-23  REFERRING DIAG: C71.9 (ICD-10-CM) - Malignant neoplasm of brain, unspecified  THERAPY DIAG:  Muscle weakness  (generalized)  Rationale for Evaluation and Treatment: Rehabilitation  SUBJECTIVE:                                                                                                                                                                                             SUBJECTIVE STATEMENT: Pt says he went to ED last Thursday (01-18-24) due to having a seizure:  feels like his left leg is a little weaker than it was prior to this occurrence.  Was started on Keppra.  Has not done much exercise at home since Thursday but wants to continue with PT -- goes today for 1st radiation treatment (at [facility]); overall feels good  Per chart note:  53 yo male presenting 12/20 to ED with decreased response time while driving, incoordination, imbalance. Imaging showed necrotic and hemorrhagic mass in the superior right frontal lobe with features of glioblastoma with mass effect. S/p R craniotomy 12/23. PMH includes: obesity and OSA.   Pt accompanied by:  wife  - [name]  PERTINENT HISTORY: history of OSA, HTN, Tinea barbae, obesity s/p gastric sleeve, liver hemangioma who was admitted on 12/15/23 with reports of decrease in coordination, minimal use of LUE/LLE and; hospital admission 12-15-23 with transfer to inpatient rehab 12-26-23 - 12-31-23  PAIN:  Are you having pain? No   PRECAUTIONS: Other: No driving; Fall  RED FLAGS: None   WEIGHT BEARING RESTRICTIONS: No  FALLS: Has patient fallen in last 6 months? No  LIVING ENVIRONMENT: Lives with: lives with their spouse Lives in: House/apartment Stairs: Yes: Internal: 12 steps; can reach both master bedroom on main level  - has not been up steps to 2nd level of home yet as only been home for 2 days Has following equipment at home: None  PLOF: Independent; pt retired from [employer] 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  DIAGNOSTIC FINDINGS: MRI brain done revealing necrotic, hemorrhagic mass in superior right frontal lobe with features of glioblastoma and mass effect with very early right uncal herniation.  COGNITION: Overall cognitive status: Within functional limits for tasks assessed and appears to have slightly delayed processing - see OT eval for details   SENSATION: WFL  COORDINATION: WFL's bil. LE's  POSTURE: No Significant postural limitations  LOWER EXTREMITY ROM:   WNL's bil. LE's   LOWER EXTREMITY MMT:  RLE WNL's  MMT Right Eval Left Eval  Hip flexion 5 4-  Hip extension 5 3-  Hip abduction  4  Hip adduction    Hip internal rotation    Hip external rotation    Knee flexion 5 4-  Knee extension 5 5  Ankle dorsiflexion 5 5  Ankle plantarflexion 5 5  Ankle inversion    Ankle eversion    (Blank rows = not tested)  BED MOBILITY:  Independent  TRANSFERS: Assistive device utilized: None  Sit to stand: Modified independence Stand to sit: Modified independence  RAMP: TBA  CURB: TBA  STAIRS: TBA   GAIT: Gait pattern: step through pattern and decreased arm swing- Left Distance walked: 100' Assistive device utilized: None Level of assistance: SBA Comments: mildly decreased Lt arm swing   FUNCTIONAL TESTS:  5 times sit to stand: 11.69 secs from chair without UE support Timed up and go (TUG): 10.87 secs without device 10 meter walk test: 11.68 secs without device = 2.81 ft/sec Berg Balance Scale: 54/56   PATIENT SURVEYS:  N/A due to dx of brain tumor                                                                                                                            TREATMENT DATE:  01/22/24  TherEx:  Bridging x 5 reps, Lt unilateral  bridge 10 reps   Bridging with marching 5 reps:  bridging with RLE extension 5 reps:  bridging with hip abdct./adduction 5 reps - rest breaks after each bridge exercise and cues to breathe during each rep of each exercise  Lt SLR - 3# weight 10 reps x 2 sets:   Lt hip flexion in hooklying position with 3# weight 10 reps;  Pt performed Lt SLR circles CW 10 reps, CCW 10 reps  Lt hip abduction in Rt sidelying position - 3# weight - 10 reps;  Clam shell exercise with 3# weight 10 reps In Rt sidelying - pt performed Lt hip flexion/extension with LLE held in abduction:  10 reps (no weight)  Prone Lt knee flexion with 3# weight 10 reps x 2 sets - cues for controlled descent Prone hip extension with knee flexed at 90 degrees with 3# weight LLE 10 reps;  Lt hip extension with knee extended with 3# weight 10 reps x 2 sets (weight  placed above knee)  Prone plank for  core stabilization - 3 reps of 15 sec hold   Tall kneeling - mini squats 10 reps;  quadruped - lifting LLE in hip extended position - performed Lt knee flexion with 3# weight 10 reps with leg remaining extended with min assist prn during set of 10 reps Performed small pulses Lt foot toward ceiling with Lt knee flexed at 90 degrees - 10 reps  Pt performed step up exercise onto [employer] step with 3# weight on LLE - stepped up, lifted LLE up to 80- 90 degrees hip flexion with knee flexed at 90 degrees Hip abduction LLE with 3# weight - lateral step up with RLE, then lifting Lt leg in abduction  Standing - 3# weight on LLE - tapping 2nd step with Lt foot for Lt hip flexor strengthening and for improved balance as this exercise was performed without UE support with SBA;  pt consciously lifted LLE during descent to prevent foot dragging on step   Pt performed slow jog from steps back to 1st mat table with SBA at end of session, after completion of step exercise   PATIENT EDUCATION: Education details: additions of single leg bridging and sidelying  hip ABD rainbow heel taps to HEP, will continue to monitor BP and may have to reach out to PCP if it remains elevated  Person educated: Patient and wife Education method: Explanation, Demonstration, Verbal cues, and Handouts Education comprehension: verbalized understanding and returned demonstration  HOME EXERCISE PROGRAM: Access Code: [email or web address] URL: [email or web address] Date: 01/04/2024 Prepared by: Maebelle Munroe  Exercises - 2# weight used for SLR, knee flexion and clam shell and hip abduction  - Supine Active Straight Leg Raise  - 1 x daily - 7 x weekly - 3 sets - 10 reps - SIDELYING LEG LIFTS; ALSO DO CIRCLES CLOCKWISE & COUNTERCLOCKWISE  - 1 x daily - 7 x weekly - 3 sets - 10 reps - Prone Hip Extension  - 1 x daily - 7 x weekly - 3 sets - 10 reps - Clamshell  - 1 x daily - 7 x weekly - 3 sets - 10 reps - 5 sec hold - Prone Knee Flexion AROM  - 1 x daily - 7 x weekly - 3 sets - 10 reps - Single Leg Stance  - 1 x daily - 7 x weekly - 1 sets - 2-3 reps - 15 sec hold - Tandem Stance  - 1 x daily - 7 x weekly - 1 sets - 2 reps - 30 sec  hold - Romberg Stance Eyes Closed on Foam Pad  - 1 x daily - 5 x weekly - 3 sets - 30 hold - Narrow Stance with Eyes Closed and Head Rotation on Foam Pad  - 1 x daily - 5 x weekly - 2 sets - 10 reps - Single Leg Bridge  - 1 x daily - 5 x weekly - 2 sets - 10 reps  GOALS: Goals reviewed with patient? Yes  SHORT TERM GOALS: SAME AS LTG'S AS ELOS = 4 WEEKS     LONG TERM GOALS: Target date: 02-02-24  Assess FGA and set goal as appropriate. Baseline:  Goal status: INITIAL  2.  Improve Berg score to 56/56 to demo improved static standing balance. Baseline: 54/56 on 01-02-24 Goal status: INITIAL  3.  Pt will amb. 10" nonstop on various surfaces including grass, pavement and indoor, without LOB and with no c/o fatigue, demonstrating Lt arm swing, with supervision.  Baseline:  Goal status: INITIAL  4.  Increase gait velocity to >/= 3.6  ft/sec without use of device for increased gait efficiency.  Baseline: 2.81 ft/sec (11.68 secs) Goal status: INITIAL  5.  Pt will jog 115' on flat, even surface independently without LOB and RPE </= 2/10.  Baseline:  Goal status: INITIAL  6.  Negotiate 4 steps using step over step sequence with supervision without use of hand rail to demo improved balance. Baseline:  Goal status: INITIAL  7.  Independent in HEP for LLE strengthening, balance and coordination.   Baseline:  Goal status: INITIAL  8.  Pt will improve composite score to above normal for age in order to demo improved balance.   Baseline: 64 (below normal)   Goal status: INITIAL  ASSESSMENT:  CLINICAL IMPRESSION: PT session focused on strengthening of LLE due to pt's report of feeling increased weakness in Lt leg following new onset seizure that occurred on 01-19-24.  Lt hip flexor MMT grade 4/5 (only very slight decrease noted compared to strength grade at initial eval); Lt hip abductors and hamstrings remain 4/5 in strength.  Pt tolerated exercises well with use of 3# weight used for PRE's.  Frequent short rest periods needed during session.  Cont with POC.     OBJECTIVE IMPAIRMENTS: Abnormal gait, decreased activity tolerance, decreased balance, decreased coordination, and decreased strength.   ACTIVITY LIMITATIONS: carrying, lifting, squatting, stairs, and locomotion level  PARTICIPATION LIMITATIONS: cleaning, laundry, driving, shopping, community activity, and occupation  PERSONAL FACTORS: Profession and 1 comorbidity: s/p craniotomy due to brain tumor  are also affecting patient's functional outcome.   REHAB POTENTIAL: Good  CLINICAL DECISION MAKING: Evolving/moderate complexity  EVALUATION COMPLEXITY: Moderate  PLAN:  PT FREQUENCY: 3x/week  PT DURATION: 4 weeks  PLANNED INTERVENTIONS: 97110-Therapeutic exercises, 97530- Therapeutic activity, 97112- Neuromuscular re-education, [phone]- Self Care, [id number]- Gait  training, [id number]- Aquatic Therapy, Patient/Family education, Balance training, and Stair training  PLAN FOR NEXT SESSION:   work on LLE strengthening, - Lt hip abductor , flexor and hip extensor strengthening:   elliptical, high level balance and strengthening, balance with EC/compliant surfaces  Marico Buckle, Donavan Burnet, PT 01/23/2024, 11:38 AM

## 2024-01-24 ENCOUNTER — Ambulatory Visit: Payer: Commercial Managed Care - PPO | Admitting: Occupational Therapy

## 2024-01-24 ENCOUNTER — Ambulatory Visit
Admission: RE | Admit: 2024-01-24 | Discharge: 2024-01-24 | Disposition: A | Discharge status: Home / Self Care | Payer: Commercial Managed Care - PPO | Source: Ambulatory Visit | Attending: Radiation Oncology | Admitting: Radiation Oncology

## 2024-01-24 ENCOUNTER — Ambulatory Visit: Payer: Commercial Managed Care - PPO | Admitting: Physical Therapy

## 2024-01-24 ENCOUNTER — Other Ambulatory Visit: Payer: Self-pay

## 2024-01-24 VITALS — BP 140/87 | HR 73

## 2024-01-24 DIAGNOSIS — M6281 Muscle weakness (generalized): Secondary | ICD-10-CM

## 2024-01-24 DIAGNOSIS — Z510 Encounter for antineoplastic radiation therapy: Secondary | ICD-10-CM | POA: Diagnosis not present

## 2024-01-24 DIAGNOSIS — R278 Other lack of coordination: Secondary | ICD-10-CM

## 2024-01-24 DIAGNOSIS — R2689 Other abnormalities of gait and mobility: Secondary | ICD-10-CM

## 2024-01-24 DIAGNOSIS — R2681 Unsteadiness on feet: Secondary | ICD-10-CM

## 2024-01-24 DIAGNOSIS — I69252 Hemiplegia and hemiparesis following other nontraumatic intracranial hemorrhage affecting left dominant side: Secondary | ICD-10-CM | POA: Diagnosis not present

## 2024-01-24 DIAGNOSIS — R41842 Visuospatial deficit: Secondary | ICD-10-CM

## 2024-01-24 LAB — RAD ONC ARIA SESSION SUMMARY
Course Elapsed Days: 2
Plan Fractions Treated to Date: 3
Plan Prescribed Dose Per Fraction: 2 Gy
Plan Total Fractions Prescribed: 23
Plan Total Prescribed Dose: 46 Gy
Reference Point Dosage Given to Date: 6 Gy
Reference Point Session Dosage Given: 2 Gy
Session Number: 3

## 2024-01-24 NOTE — Therapy (Signed)
OUTPATIENT PHYSICAL THERAPY NEURO TREATMENT NOTE   Patient Name: Nicholas Stevens MRN: [id number] DOB:[birth year], 53 y.o., male Today's Date: 01/24/2024   PCP: Jenelle Mages., MD REFERRING PROVIDER: Charlton Amor, PA-C  END OF SESSION:  PT End of Session - 01/24/24 1016     Visit Number 8    Number of Visits 13    Date for PT Re-Evaluation 02/02/24    Authorization Type [employer]    Authorization Time Period 01-02-24 - 03-01-24    PT Start Time 1016    PT Stop Time 1058    PT Time Calculation (min) 42 min    Equipment Utilized During Treatment Gait belt    Activity Tolerance Patient tolerated treatment well    Behavior During Therapy WFL for tasks assessed/performed               Past Medical History:  Diagnosis Date   DDD (degenerative disc disease), lumbar    HNP (herniated nucleus pulposus)    Hypertension    Liver hemangioma    Morbid obesity (HCC)    OSA (obstructive sleep apnea)    no cpap used since weight loss   Overweight(278.02)    Plantar fasciitis of right foot    Sleep apnea    Tinea barbae    Past Surgical History:  Procedure Laterality Date   BREATH TEK H PYLORI N/A 08/20/2013   Procedure: BREATH TEK Richardo Priest;  Surgeon: Valarie Merino, MD;  Location: [facility];  Service: General;  Laterality: N/A;   COLONOSCOPY WITH PROPOFOL N/A 05/05/2022   Procedure: COLONOSCOPY WITH PROPOFOL;  Surgeon: Beverley Fiedler, MD;  Location: [facility];  Service: Gastroenterology;  Laterality: N/A;   CRANIOTOMY Right 12/18/2023   Procedure: Right Frontal Stereotactic Craniotomy for Resection of Tumor;  Surgeon: Bedelia Person, MD;  Location: [facility];  Service: Neurosurgery;  Laterality: Right;   ESOPHAGOGASTRODUODENOSCOPY (EGD) WITH PROPOFOL N/A 03/15/2023   Procedure: ESOPHAGOGASTRODUODENOSCOPY (EGD) WITH PROPOFOL;  Surgeon: Iva Boop, MD;  Location: [facility];  Service: Gastroenterology;  Laterality: N/A;   LAPAROSCOPIC APPENDECTOMY  2007   Dr  Carolynne Edouard   LAPAROSCOPIC GASTRIC SLEEVE RESECTION N/A 10/08/2013   Procedure: LAPAROSCOPIC SLEEVE GASTRECTOMY;  Surgeon: Valarie Merino, MD;  Location: [facility];  Service: General;  Laterality: N/A;   LUMBAR LAMINECTOMY/DECOMPRESSION MICRODISCECTOMY N/A 09/16/2015   Procedure: MICRO LUMBAR DECOMPRESSION, MICRODISCECTOMY L5 - S1;  Surgeon: Jene Every, MD;  Location: [facility];  Service: Orthopedics;  Laterality: N/A;   POLYPECTOMY  05/05/2022   Procedure: POLYPECTOMY;  Surgeon: Beverley Fiedler, MD;  Location: [facility];  Service: Gastroenterology;;   ROTATOR CUFF REPAIR Right 2005   Dr Lajoyce Corners   Patient Active Problem List   Diagnosis Date Noted   Focal seizure (HCC) 01/22/2024   Allergic drug rash 12/29/2023   DVT, lower extremity, distal (HCC) 12/29/2023   Frontal glioblastoma (HCC) 12/26/2023   Brain mass 12/15/2023   Pseudofolliculitis barbae 05/01/2023   Iron deficiency 04/06/2023   Abdominal pain 03/15/2023   Microcytosis 03/14/2023   RUQ pain 03/12/2023   Abnormal EKG 03/12/2023   Chest pain 03/12/2023   Adult acne 01/19/2023   Obesity (BMI 30-39.9) 01/19/2023   Benign neoplasm of cecum    Benign neoplasm of ascending colon    Benign neoplasm of transverse colon    Difficult airway for intubation 01/18/2022   Upper respiratory disease 01/31/2019   Cerumen impaction 07/30/2018   HTN (hypertension) 11/03/2017   Spinal stenosis of lumbar region  09/16/2015   Insomnia 04/15/2015   Well adult exam 11/04/2014   H/O gastric sleeve 10/08/2013   Hypogonadism, male 07/17/2013   Erectile dysfunction 03/22/2013   Concentration deficit 04/27/2011   Attention or concentration deficit 04/27/2011   TINEA BARBAE 03/04/2009   LIVER HEMANGIOMA 03/04/2009   OVERWEIGHT-BMI 42 03/04/2009   OSA on CPAP 03/04/2009   DEGENERATIVE DISC DISEASE, LUMBAR SPINE 03/04/2009    ONSET DATE: 12-15-23  REFERRING DIAG: C71.9 (ICD-10-CM) - Malignant neoplasm of brain, unspecified  THERAPY DIAG:   Unsteadiness on feet  Other abnormalities of gait and mobility  Muscle weakness (generalized)  Rationale for Evaluation and Treatment: Rehabilitation  SUBJECTIVE:                                                                                                                                                                                             SUBJECTIVE STATEMENT: Patient reports that his strength continues to feel more improved than last week. Denies falls and near falls. Reports no changes in medications. Patient reports that it feels challenging to slide his bedroom slipper off on L side when he is standing.   Pt accompanied by:  wife  - [name]  PERTINENT HISTORY: history of OSA, HTN, Tinea barbae, obesity s/p gastric sleeve, liver hemangioma who was admitted on 12/15/23 with reports of decrease in coordination, minimal use of LUE/LLE and; hospital admission 12-15-23 with transfer to inpatient rehab 12-26-23 - 12-31-23  PAIN:  Are you having pain? No  PRECAUTIONS: Other: No driving; Fall  RED FLAGS: None   WEIGHT BEARING RESTRICTIONS: No  FALLS: Has patient fallen in last 6 months? No  LIVING ENVIRONMENT: Lives with: lives with their spouse Lives in: House/apartment Stairs: Yes: Internal: 12 steps; can reach both master bedroom on main level  - has not been up steps to 2nd level of home yet as only been home for 2 days Has following equipment at home: None  PLOF: Independent; pt retired from [employer] 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  DIAGNOSTIC FINDINGS: MRI brain done revealing necrotic, hemorrhagic mass in superior  right frontal lobe with features of glioblastoma and mass effect with very early right uncal herniation.  COGNITION: Overall cognitive status: Within functional limits for tasks assessed and appears to have  slightly delayed processing - see OT eval for details   SENSATION: WFL  COORDINATION: WFL's bil. LE's  POSTURE: No Significant postural limitations  LOWER EXTREMITY ROM:   WNL's bil. LE's   LOWER EXTREMITY MMT:  RLE WNL's  MMT Right Eval Left Eval  Hip flexion 5 4-  Hip extension 5 3-  Hip abduction  4  Hip adduction    Hip internal rotation    Hip external rotation    Knee flexion 5 4-  Knee extension 5 5  Ankle dorsiflexion 5 5  Ankle plantarflexion 5 5  Ankle inversion    Ankle eversion    (Blank rows = not tested)  BED MOBILITY:  Independent  TRANSFERS: Assistive device utilized: None  Sit to stand: Modified independence Stand to sit: Modified independence  RAMP: TBA  CURB: TBA  STAIRS: TBA   GAIT: Gait pattern: step through pattern and decreased arm swing- Left Distance walked: 100' Assistive device utilized: None Level of assistance: SBA Comments: mildly decreased Lt arm swing   FUNCTIONAL TESTS:  5 times sit to stand: 11.69 secs from chair without UE support Timed up and go (TUG): 10.87 secs without device 10 meter walk test: 11.68 secs without device = 2.81 ft/sec Berg Balance Scale: 54/56   PATIENT SURVEYS:  N/A due to dx of brain tumor                                                                                                                            TREATMENT DATE:  01/24/24  Vitals:   01/24/24 1021  BP: (!) 140/87  Pulse: 73   Seated on LUE at rest   NMR: Cone taps without placing leg down alternating down and back 2 sets of 3 round x 3 cones to work on coordination for shoe placement (CGA, moderate challenge with use of LLE for cone tapping) Cone knock overs 2 x 3 reps to work on coordination for shoe placement  (CGA,  moderate challenge with use of LLE for cone tapping) 12" hurdle step over and backs for stepping reaction, hip flexor, and stance stability 2 x 10 reps (CGA, knocked over hurdle 1x)  12" hurdle obstacle course forward stepping 2 x 4 hurdles (CGA) 12" hurdle obstacle course forward stepping, pausing in tandem with 2 head turns each direction (CGA, knocked over 1 hurdle, initially harder time following multiple step process and needed cues on tasks, improved with reps) 12" hurdle obstacle course down and back lateral stepping 3 x 4 hurdles each direction (CGA - cues to not external rotate at hips)  Tandem balance beam walking with ball toss down and back 3 laps between // bars (CGA, 2x fell off beam requiring UE support to stabilize)  TherEx:  Attempted tall kneel into half kneel with double progressing to single UE support on bench required CGA, min tolerance due to knee pain x 3 each side Tall knee with around the [place] passes of 4lb med  ball x 5 circles (CGA)  Improved tolerance but still not for long periods of time due to knee pain  PATIENT EDUCATION: Education details: Continue HEP + activity tolerance and modification as needed Person educated: Patient and wife Education method: Explanation, Demonstration, Verbal cues, and Handouts Education comprehension: verbalized understanding and returned demonstration  HOME EXERCISE PROGRAM: Access Code: [email or web address] URL: [email or web address] Date: 01/04/2024 Prepared by: Maebelle Munroe  Exercises - 2# weight used for SLR, knee flexion and clam shell and hip abduction  - Supine Active Straight Leg Raise  - 1 x daily - 7 x weekly - 3 sets - 10 reps - SIDELYING LEG LIFTS; ALSO DO CIRCLES CLOCKWISE & COUNTERCLOCKWISE  - 1 x daily - 7 x weekly - 3 sets - 10 reps - Prone Hip Extension  - 1 x daily - 7 x weekly - 3 sets - 10 reps - Clamshell  - 1 x daily - 7 x weekly - 3 sets - 10 reps - 5 sec hold - Prone Knee Flexion AROM  - 1 x daily -  7 x weekly - 3 sets - 10 reps - Single Leg Stance  - 1 x daily - 7 x weekly - 1 sets - 2-3 reps - 15 sec hold - Tandem Stance  - 1 x daily - 7 x weekly - 1 sets - 2 reps - 30 sec  hold - Romberg Stance Eyes Closed on Foam Pad  - 1 x daily - 5 x weekly - 3 sets - 30 hold - Narrow Stance with Eyes Closed and Head Rotation on Foam Pad  - 1 x daily - 5 x weekly - 2 sets - 10 reps - Single Leg Bridge  - 1 x daily - 5 x weekly - 2 sets - 10 reps  GOALS: Goals reviewed with patient? Yes  SHORT TERM GOALS: SAME AS LTG'S AS ELOS = 4 WEEKS   LONG TERM GOALS: Target date: 02-02-24  Assess FGA and set goal as appropriate. Baseline:  Goal status: INITIAL  2.  Improve Berg score to 56/56 to demo improved static standing balance. Baseline: 54/56 on 01-02-24 Goal status: INITIAL  3.  Pt will amb. 10" nonstop on various surfaces including grass, pavement and indoor, without LOB and with no c/o fatigue, demonstrating Lt arm swing, with supervision.  Baseline:  Goal status: INITIAL  4.  Increase gait velocity to >/= 3.6 ft/sec without use of device for increased gait efficiency.  Baseline: 2.81 ft/sec (11.68 secs) Goal status: INITIAL  5.  Pt will jog 115' on flat, even surface independently without LOB and RPE </= 2/10.  Baseline:  Goal status: INITIAL  6.  Negotiate 4 steps using step over step sequence with supervision without use of hand rail to demo improved balance. Baseline:  Goal status: INITIAL  7.  Independent in HEP for LLE strengthening, balance and coordination.   Baseline:  Goal status: INITIAL  8.  Pt will improve composite score to above normal for age in order to demo improved balance.   Baseline: 64 (below normal)   Goal status: INITIAL  ASSESSMENT:  CLINICAL IMPRESSION: PT session focused on strengthening of LLE and coordination/precision tasks with single leg stance emphasis. Patient more off balance then when this PT previously worked with patient and recommended more  frequent rest breaks during session for pacing. 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 Min tolerance for tall kneel tasks due to chronic history of knee  pain. Cont with POC.     OBJECTIVE IMPAIRMENTS: Abnormal gait, decreased activity tolerance, decreased balance, decreased coordination, and decreased strength.   ACTIVITY LIMITATIONS: carrying, lifting, squatting, stairs, and locomotion level  PARTICIPATION LIMITATIONS: cleaning, laundry, driving, shopping, community activity, and occupation  PERSONAL FACTORS: Profession and 1 comorbidity: s/p craniotomy due to brain tumor  are also affecting patient's functional outcome.   REHAB POTENTIAL: Good  CLINICAL DECISION MAKING: Evolving/moderate complexity  EVALUATION COMPLEXITY: Moderate  PLAN:  PT FREQUENCY: 3x/week  PT DURATION: 4 weeks  PLANNED INTERVENTIONS: 97110-Therapeutic exercises, 97530- Therapeutic activity, 97112- Neuromuscular re-education, [phone]- Self Care, [id number]- Gait training, [phone]- Aquatic Therapy, Patient/Family education, Balance training, and Stair training  PLAN FOR NEXT SESSION:   work on LLE strengthening, - Lt hip abductor , flexor and hip extensor strengthening:  work on adding in [place] strength tasks - ladder drills to simulate football days as patient enjoys this work  Carmelia Bake, PT, DPT 01/24/2024, 12:25 PM

## 2024-01-24 NOTE — Therapy (Signed)
OUTPATIENT OCCUPATIONAL THERAPY NEURO TREATMENT  Patient Name: Nicholas Stevens MRN: [id number] DOB:20-Dec-1971, 53 y.o., male Today's Date: 01/24/2024  PCP: Nicholas Garter, MD REFERRING PROVIDER: Charlton Amor, PA-C   END OF SESSION:  OT End of Session - 01/24/24 0934     Visit Number 5    Number of Visits 16    Date for OT Re-Evaluation 03/22/24    Authorization Type [employer]    OT Start Time [phone]    OT Stop Time 1015    OT Time Calculation (min) 42 min    Activity Tolerance Patient tolerated treatment well    Behavior During Therapy WFL for tasks assessed/performed               Past Medical History:  Diagnosis Date   DDD (degenerative disc disease), lumbar    HNP (herniated nucleus pulposus)    Hypertension    Liver hemangioma    Morbid obesity (HCC)    OSA (obstructive sleep apnea)    no cpap used since weight loss   Overweight(278.02)    Plantar fasciitis of right foot    Sleep apnea    Tinea barbae    Past Surgical History:  Procedure Laterality Date   BREATH TEK H PYLORI N/A 08/20/2013   Procedure: BREATH TEK Nicholas Stevens;  Surgeon: Nicholas Merino, MD;  Location: [facility];  Service: General;  Laterality: N/A;   COLONOSCOPY WITH PROPOFOL N/A 05/05/2022   Procedure: COLONOSCOPY WITH PROPOFOL;  Surgeon: Nicholas Fiedler, MD;  Location: [facility];  Service: Gastroenterology;  Laterality: N/A;   CRANIOTOMY Right 12/18/2023   Procedure: Right Frontal Stereotactic Craniotomy for Resection of Tumor;  Surgeon: Nicholas Person, MD;  Location: [facility] OR;  Service: Neurosurgery;  Laterality: Right;   ESOPHAGOGASTRODUODENOSCOPY (EGD) WITH PROPOFOL N/A 03/15/2023   Procedure: ESOPHAGOGASTRODUODENOSCOPY (EGD) WITH PROPOFOL;  Surgeon: Nicholas Boop, MD;  Location: [facility];  Service: Gastroenterology;  Laterality: N/A;   LAPAROSCOPIC APPENDECTOMY  2007   Dr Nicholas Stevens   LAPAROSCOPIC GASTRIC SLEEVE RESECTION N/A 10/08/2013   Procedure: LAPAROSCOPIC  SLEEVE GASTRECTOMY;  Surgeon: Nicholas Merino, MD;  Location: [facility];  Service: General;  Laterality: N/A;   LUMBAR LAMINECTOMY/DECOMPRESSION MICRODISCECTOMY N/A 09/16/2015   Procedure: MICRO LUMBAR DECOMPRESSION, MICRODISCECTOMY L5 - S1;  Surgeon: Nicholas Every, MD;  Location: [facility];  Service: Orthopedics;  Laterality: N/A;   POLYPECTOMY  05/05/2022   Procedure: POLYPECTOMY;  Surgeon: Nicholas Fiedler, MD;  Location: [facility];  Service: Gastroenterology;;   ROTATOR CUFF REPAIR Right 2005   Dr Nicholas Stevens   Patient Active Problem List   Diagnosis Date Noted   Focal seizure (HCC) 01/22/2024   Allergic drug rash 12/29/2023   DVT, lower extremity, distal (HCC) 12/29/2023   Frontal glioblastoma (HCC) 12/26/2023   Brain mass 12/15/2023   Pseudofolliculitis barbae 05/01/2023   Iron deficiency 04/06/2023   Abdominal pain 03/15/2023   Microcytosis 03/14/2023   RUQ pain 03/12/2023   Abnormal EKG 03/12/2023   Chest pain 03/12/2023   Adult acne 01/19/2023   Obesity (BMI 30-39.9) 01/19/2023   Benign neoplasm of cecum    Benign neoplasm of ascending colon    Benign neoplasm of transverse colon    Difficult airway for intubation 01/18/2022   Upper respiratory disease 01/31/2019   Cerumen impaction 07/30/2018   HTN (hypertension) 11/03/2017   Spinal stenosis of lumbar region 09/16/2015   Insomnia 04/15/2015   Well adult exam 11/04/2014   H/O gastric sleeve 10/08/2013  Hypogonadism, male 07/17/2013   Erectile dysfunction 03/22/2013   Concentration deficit 04/27/2011   Attention or concentration deficit 04/27/2011   TINEA BARBAE 03/04/2009   LIVER HEMANGIOMA 03/04/2009   OVERWEIGHT-BMI 42 03/04/2009   OSA on CPAP 03/04/2009   DEGENERATIVE DISC DISEASE, LUMBAR SPINE 03/04/2009    ONSET DATE: 12/27/22 (referral date)  REFERRING DIAG: C71.9 (ICD-10-CM) - Malignant neoplasm of brain, unspecified   THERAPY DIAG:  Visuospatial deficit  Other lack of coordination  Muscle weakness  (generalized)  Rationale for Evaluation and Treatment: Rehabilitation  SUBJECTIVE:   SUBJECTIVE STATEMENT:  He has daily cancer treatments beginning today.  Pt reports no pain but felt his L leg was weaker.   Pt accompanied by: self and significant other - Nicholas Stevens   PERTINENT HISTORY:  PMH includes: HTN, obesity (gastric sleeve), OSA, lumbar DDD.   53 yo male presenting 12/20 with decreased response time while driving, incoordination, imbalance. Pt was admitted to [facility] hospital from ED on 12/15/23;  Imaging showed necrotic and hemorrhagic mass in the superior right frontal lobe with features of glioblastoma with mass effect. underwent craniotomy for brain tumor (with features of gliobastoma per chart note) on 12/18/23;  pt was transferred to inpatient rehab on 12/26/23 and discharged home with wife on 12/31/23.     Per 12/27/23 acute OT evaluation: "history of OSA, HTN, obesity s/p gastric sleeve, liver hemangioma who was admitted on 12/15/23 with reports of decrease in coordination, minimal use of LUE/LLE and fall prompting treatment. MRI brain done revealing necrotic, hemorrhagic mass in superior right frontal lobe with features of glioblastoma and mass effect with very early right uncal herniation.  He underwent right frontal stereotactic  craniotomy for resection of frontal lobe tumor on 12/23 by Dr. Maisie Stevens.  Post op has had improvement in LUE strength and follow up MRI showed good debulking of tumor with decrease in mass effect, collapse of lesion with region of enhancement  and 5 mm right to left shift. Pathology revealed high grade glioma grade 4 c/w glioblastoma."  PRECAUTIONS: Fall and Other: No driving  WEIGHT BEARING RESTRICTIONS: No  PAIN:  Are you having pain? No  FALLS: Has patient fallen in last 6 months? Yes. Number of falls 1   Pt reported he was working the day of the incident and noticed his reaction time was slow.  He just thought his cold medincine was making him slow,  but he ended up falling into the nightstand and wall and couldn't get himself up due to L UE/LE weakness prompting ED visit and hospitalization.  LIVING ENVIRONMENT: Lives with: lives with their family, lives with their spouse (high school sweethearts), and lives with youngest 83 yo son - 1 of 3 children) Lives in: House Stairs: Yes: Internal: 14  steps; can reach both and External: 1 threshold  steps; none  Master bedroom on main level  - has not been up steps to 2nd level of home yet as only been home for 2 days  Has following equipment: has built in seat in walk-in shower but not using it   PLOF: Independent - has a home gym, worked in [employer] and had driven to/from [employer] on the day of the incident  PATIENT GOALS: To improve my strength and balance.  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  HAND DOMINANCE: Right  ADLs: Overall ADLs: Pt has resumed all ADLs on his own, even standing in shower, managing fasteners although they may take a little longer.  IADLs: Shopping: Goes out with wife but unable to drive Light housekeeping: Wife isn't currently letting him Meal Prep: Wife is still doing meal prep although he was primary with this task previously Community mobility: Ind Medication management: Wife has it set up for him Financial management: Assisting wife, able to recall account numbers etc Handwriting: 100% legible - no change R UE unaffected  MOBILITY STATUS: Independent  POSTURE COMMENTS:  forward head Sitting balance: WNL  ACTIVITY TOLERANCE: Activity tolerance: limited  FUNCTIONAL OUTCOME MEASURES: Lawton-Brody IADL Scale: 3/8    UPPER EXTREMITY ROM:    Active ROM Right eval Left eval   Shoulder flexion WNL Slight end range limitations  Shoulder abduction    Shoulder adduction    Shoulder extension    Shoulder internal rotation    Shoulder external rotation    Elbow flexion    Elbow extension    Wrist flexion    Wrist extension    Wrist ulnar deviation    Wrist radial deviation    Wrist pronation    Wrist supination    (Blank rows = not tested)  UPPER EXTREMITY MMT:     MMT Right eval Left eval  Shoulder flexion 5/5 4/5  Shoulder abduction    Shoulder adduction    Shoulder extension    Shoulder internal rotation    Shoulder external rotation    Middle trapezius    Lower trapezius    Elbow flexion    Elbow extension    Wrist flexion    Wrist extension    Wrist ulnar deviation    Wrist radial deviation    Wrist pronation    Wrist supination    (Blank rows = not tested)  HAND FUNCTION: Grip strength: Right: 60.4 - 1st attempt not included, 82.2, 86.1, 106.0  lbs; Left: 51.3, 54.8, 52.6 lbs Average: Right: 91.4 lbs Left: 52.9 lbs   01/22/24 -  Right , 96, 88, 99 Left 45, 47, 66, 47, 64  Average Right 94.3 lbs Left: 53.8 lbs  COORDINATION: 9 Hole Peg test: Right: 19.15 sec; Left: 32.18 sec Box and Blocks:  Right 51 blocks, Left 37 blocks  01/22/24 - 9 hole peg test - 23.05 sec Box and Blocks test - 53 blocks  SENSATION: WFL  EDEMA: NA  MUSCLE TONE: WFL  COGNITION: Overall cognitive status: Within functional limits for tasks assessed Recalled account number to bills independently VISION: Subjective report: Drops for glaucoma prevention/keep pressure down Baseline vision: Wears glasses for reading only Visual history:  NA  VISION ASSESSMENT: WFL  Patient has difficulty with following activities due to following visual impairments: NA  PERCEPTION: Not tested  PRAXIS: Not tested  OBSERVATIONS at eval: Pt ambulated with use of no AE and no loss of balance but with mildly decreased L arm swing and what OTR still noted as a slight  favoring of R side with gaze. The pt is well kept and was accompanied by his high school sweetheart/wife who did admit to overdoing it when helping him at times when pointed out by OTR.  TREATMENT DATE:   Therapeutic Activities  OT placed 18 colored clothespins (6 red, 6 blue and 6 black) around the gym for patient to locate.   Pt had to locate all 6 of 1 color before retrieving the next color for improved scanning, memory and concentration for locating items to promote visual neuro rehabilitation.  For additional challenge, therapist had matched the colored clothespin with similar colored objects around the gym ie) black on a black cord etc.  In addition, OT placed objects at all levels (high and low) and on both sides for scanning left and right as well as in areas requiring him to look slightly behind object ie) atop a ball rack in gym.  Pt had only 1 incident where he bumped his toe on an object but recovered without incident. Pt was on his feet for >15 minutes without tiring.  Pt able to find 2/3 of the objects on his own with min cues to find at least 2 objects he had previously located.   Therapeutic Exercises  Gripper set at level 55 lbs resistance to pick up blocks with L hand for sustained grip strength with good success stacking blocks x 4 in height x10+ towers, crossing midline for several towers and then using translation to pick up blocks one at a time until patient got 4+ in his hand before retuning them to the bucket.  Demonstrated theraband exercises to work on L UE crossing midline as he was aware of this when working on the gripper task. Various modifications made by changing positions ie) turning body 90 degrees x 4 positions for standing shoulder extension at top of the door ie) pull down, PNF like patterns L to R and R to L as well as pull out motions with back  facing the door.    PATIENT EDUCATION: Education details: HEP progression with theraband Stevens educated: Patient and Spouse Education method: Explanation, Demonstration, and Verbal cues Education comprehension: verbalized understanding, returned demonstration, and needs further education  HOME EXERCISE PROGRAM: 01/11/24 - FM coordination HEP 01/12/24 - BUE strengthening Theraband - Access Code: [facility] 01/22/24 - Putty Exercises/activities - Access Code: [email or web address]  GOALS: Goals reviewed with patient? Yes   LONG TERM GOALS: Target date: 02/02/24  Patient will demonstrate initial LUE strength/coordination HEP with visual handouts only for proper execution to resume use of home gym including [employer] etc. Baseline: New to outpt OT Goal status: MET  2.  Patient will demonstrate at least 70+ lbs LUE grip strength as needed to open jars and other containers. Baseline: 52.9 lbs Goal status: IN Progress 01/22/24: Left: 53.8 lbs (greatest squeeze 66 lbs)  3.  Patient will demo improved FM coordination as evidenced by completing nine-hole peg with use of LUE in 25 seconds or less. Baseline: 32.18 Goal status: MET 01/22/24: Left 23.05 sec  4.  Pt will be able to place at least 42+ blocks using left hand with completion of Box and Blocks test. Baseline: 37 blocks Goal status: MET 01/22/24: 53 blocks  5.  Patient will demonstrate at least 95% accuracy with environmental visual scanning in distracting environment to assist with ADLs and IADLS including eventual driving considerations and work-related tasks as Hydrographic surveyor.   Baseline: New to outpt OT Goal status: IN Progress 01/24/24: Pt reported driving in neighborhood  6.  Pt will have adequate standing tolerance and balance to safely complete IADLS without supervision including preparing meals per PLOF. Baseline: Spouse is preparing meals. Brody-Lawton Scale 3/8 Goal status: MET  01/22/24: Spouse and wife reports he is making  meals again.  7. Patient will demo improved BUE FM coordination as evidenced by independence with sorting medication on his own without dropping pills.  Baseline: Wife is assisting him Goal status: MET  ASSESSMENT:  CLINICAL IMPRESSION: Patient is a 53 y.o. male who was seen today for occupational therapy treatment for residual L UE deficits s/p glioblastoma and craniotomy. Patient did well on his feet > 15 minutes for visual scanning task and continues to benefit from progression of HEP ideas.  Pt will benefit from a few more skilled OT services in the outpatient setting to work on strength and motor control of LUE to help pt return to PLOF as able.   [name]   PERFORMANCE DEFICITS: in functional skills including IADLs, coordination, dexterity, proprioception, ROM, strength, Fine motor control, Gross motor control, balance, endurance, vision, and UE functional use, cognitive skills including attention, and psychosocial skills including coping strategies and routines and behaviors.   IMPAIRMENTS: are limiting patient from IADLs, work, leisure, and social participation.   CO-MORBIDITIES: may have co-morbidities  that affects occupational performance. Patient will benefit from skilled OT to address above impairments and improve overall function.  REHAB POTENTIAL: Excellent   PLAN:  OT FREQUENCY: 1-2x/week  OT DURATION: up to 6 weeks but planning for 4 weeks  PLANNED INTERVENTIONS: 97535 self care/ADL training, 40981 therapeutic exercise, 97530 therapeutic activity, 97112 neuromuscular re-education, functional mobility training, coping strategies training, and patient/family education  RECOMMENDED OTHER SERVICES: PT evaluation occurred date of eval also  CONSULTED AND AGREED WITH PLAN OF CARE: Patient and family member/caregiver  PLAN FOR NEXT SESSION:  Strengthening HEP - progress HEP as tolerated Body blade - LUE especially Coordination activities - progress HEP as tolerated Scanning  with distractions/Reaction times - Blaze Pods Standing balance and activity tolerance for cooking   Victorino Sparrow, OT 01/24/2024, 9:36 AM

## 2024-01-25 ENCOUNTER — Ambulatory Visit: Payer: Commercial Managed Care - PPO | Admitting: Physical Therapy

## 2024-01-25 ENCOUNTER — Ambulatory Visit
Admission: RE | Admit: 2024-01-25 | Discharge: 2024-01-25 | Disposition: A | Discharge status: Home / Self Care | Payer: Commercial Managed Care - PPO | Source: Ambulatory Visit | Attending: Radiation Oncology | Admitting: Radiation Oncology

## 2024-01-25 ENCOUNTER — Other Ambulatory Visit: Payer: Self-pay

## 2024-01-25 ENCOUNTER — Encounter: Payer: Self-pay | Admitting: Internal Medicine

## 2024-01-25 ENCOUNTER — Other Ambulatory Visit: Payer: Self-pay | Admitting: Physical Medicine and Rehabilitation

## 2024-01-25 DIAGNOSIS — R2681 Unsteadiness on feet: Secondary | ICD-10-CM

## 2024-01-25 DIAGNOSIS — I69252 Hemiplegia and hemiparesis following other nontraumatic intracranial hemorrhage affecting left dominant side: Secondary | ICD-10-CM | POA: Diagnosis not present

## 2024-01-25 DIAGNOSIS — Z510 Encounter for antineoplastic radiation therapy: Secondary | ICD-10-CM | POA: Diagnosis not present

## 2024-01-25 DIAGNOSIS — M6281 Muscle weakness (generalized): Secondary | ICD-10-CM

## 2024-01-25 LAB — RAD ONC ARIA SESSION SUMMARY
Course Elapsed Days: 3
Plan Fractions Treated to Date: 4
Plan Prescribed Dose Per Fraction: 2 Gy
Plan Total Fractions Prescribed: 23
Plan Total Prescribed Dose: 46 Gy
Reference Point Dosage Given to Date: 8 Gy
Reference Point Session Dosage Given: 2 Gy
Session Number: 4

## 2024-01-25 NOTE — Therapy (Signed)
OUTPATIENT PHYSICAL THERAPY NEURO TREATMENT NOTE   Patient Name: Nicholas Stevens MRN: [id number] DOB:22-Dec-1971, 53 y.o., male Today's Date: 01/26/2024   PCP: Jenelle Mages., MD REFERRING PROVIDER: Charlton Amor, PA-C  END OF SESSION:  PT End of Session - 01/26/24 1943     Visit Number 9    Number of Visits 13    Date for PT Re-Evaluation 02/02/24    Authorization Type [employer]    Authorization Time Period 01-02-24 - 03-01-24    PT Start Time 1017    PT Stop Time 1100    PT Time Calculation (min) 43 min    Equipment Utilized During Treatment --    Activity Tolerance Patient tolerated treatment well    Behavior During Therapy WFL for tasks assessed/performed                Past Medical History:  Diagnosis Date   DDD (degenerative disc disease), lumbar    HNP (herniated nucleus pulposus)    Hypertension    Liver hemangioma    Morbid obesity (HCC)    OSA (obstructive sleep apnea)    no cpap used since weight loss   Overweight(278.02)    Plantar fasciitis of right foot    Sleep apnea    Tinea barbae    Past Surgical History:  Procedure Laterality Date   BREATH TEK H PYLORI N/A 08/20/2013   Procedure: BREATH TEK Richardo Priest;  Surgeon: Valarie Merino, MD;  Location: [facility];  Service: General;  Laterality: N/A;   COLONOSCOPY WITH PROPOFOL N/A 05/05/2022   Procedure: COLONOSCOPY WITH PROPOFOL;  Surgeon: Beverley Fiedler, MD;  Location: [facility];  Service: Gastroenterology;  Laterality: N/A;   CRANIOTOMY Right 12/18/2023   Procedure: Right Frontal Stereotactic Craniotomy for Resection of Tumor;  Surgeon: Bedelia Person, MD;  [facility];  Service: Neurosurgery;  Laterality: Right;   ESOPHAGOGASTRODUODENOSCOPY (EGD) WITH PROPOFOL N/A 03/15/2023   Procedure: ESOPHAGOGASTRODUODENOSCOPY (EGD) WITH PROPOFOL;  Surgeon: Iva Boop, MD;  Location: [facility];  Service: Gastroenterology;  Laterality: N/A;   LAPAROSCOPIC APPENDECTOMY  2007   Dr Carolynne Edouard    LAPAROSCOPIC GASTRIC SLEEVE RESECTION N/A 10/08/2013   Procedure: LAPAROSCOPIC SLEEVE GASTRECTOMY;  Surgeon: Valarie Merino, MD;  Location: [facility];  Service: General;  Laterality: N/A;   LUMBAR LAMINECTOMY/DECOMPRESSION MICRODISCECTOMY N/A 09/16/2015   Procedure: MICRO LUMBAR DECOMPRESSION, MICRODISCECTOMY L5 - S1;  Surgeon: Jene Every, MD;  Location: [facility];  Service: Orthopedics;  Laterality: N/A;   POLYPECTOMY  05/05/2022   Procedure: POLYPECTOMY;  Surgeon: Beverley Fiedler, MD;  Location: [facility];  Service: Gastroenterology;;   ROTATOR CUFF REPAIR Right 2005   Dr Lajoyce Corners   Patient Active Problem List   Diagnosis Date Noted   Focal seizure (HCC) 01/22/2024   Allergic drug rash 12/29/2023   DVT, lower extremity, distal (HCC) 12/29/2023   Frontal glioblastoma (HCC) 12/26/2023   Brain mass 12/15/2023   Pseudofolliculitis barbae 05/01/2023   Iron deficiency 04/06/2023   Abdominal pain 03/15/2023   Microcytosis 03/14/2023   RUQ pain 03/12/2023   Abnormal EKG 03/12/2023   Chest pain 03/12/2023   Adult acne 01/19/2023   Obesity (BMI 30-39.9) 01/19/2023   Benign neoplasm of cecum    Benign neoplasm of ascending colon    Benign neoplasm of transverse colon    Difficult airway for intubation 01/18/2022   Upper respiratory disease 01/31/2019   Cerumen impaction 07/30/2018   HTN (hypertension) 11/03/2017   Spinal stenosis of lumbar region  09/16/2015   Insomnia 04/15/2015   Well adult exam 11/04/2014   H/O gastric sleeve 10/08/2013   Hypogonadism, male 07/17/2013   Erectile dysfunction 03/22/2013   Concentration deficit 04/27/2011   Attention or concentration deficit 04/27/2011   TINEA BARBAE 03/04/2009   LIVER HEMANGIOMA 03/04/2009   OVERWEIGHT-BMI 42 03/04/2009   OSA on CPAP 03/04/2009   DEGENERATIVE DISC DISEASE, LUMBAR SPINE 03/04/2009    ONSET DATE: 12-15-23  REFERRING DIAG: C71.9 (ICD-10-CM) - Malignant neoplasm of brain, unspecified  THERAPY DIAG:  Unsteadiness  on feet  Muscle weakness (generalized)  Rationale for Evaluation and Treatment: Rehabilitation  SUBJECTIVE:                                                                                                                                                                                             SUBJECTIVE STATEMENT: Pt says he went to ED last Thursday (01-18-24) due to having a seizure:  feels like his left leg is a little weaker than it was prior to this occurrence.  Was started on Keppra.  Has not done much exercise at home since Thursday but wants to continue with PT -- goes today for 1st radiation treatment (at [facility]); overall feels good  Per chart note:  53 yo male presenting 12/20 to ED with decreased response time while driving, incoordination, imbalance. Imaging showed necrotic and hemorrhagic mass in the superior right frontal lobe with features of glioblastoma with mass effect. S/p R craniotomy 12/23. PMH includes: obesity and OSA.   Pt accompanied by:  wife  - [name]  PERTINENT HISTORY: history of OSA, HTN, Tinea barbae, obesity s/p gastric sleeve, liver hemangioma who was admitted on 12/15/23 with reports of decrease in coordination, minimal use of LUE/LLE and; hospital admission 12-15-23 with transfer to inpatient rehab 12-26-23 - 12-31-23  PAIN:  Are you having pain? No   PRECAUTIONS: Other: No driving; Fall  RED FLAGS: None   WEIGHT BEARING RESTRICTIONS: No  FALLS: Has patient fallen in last 6 months? No  LIVING ENVIRONMENT: Lives with: lives with their spouse Lives in: House/apartment Stairs: Yes: Internal: 12 steps; can reach both master bedroom on main level  - has not been up steps to 2nd level of home yet as only been home for 2 days Has following equipment at home: None  PLOF: Independent; pt retired from [employer] 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  DIAGNOSTIC FINDINGS: MRI brain done revealing necrotic, hemorrhagic mass in superior right frontal lobe with features of glioblastoma and mass effect with very early right uncal herniation.  COGNITION: Overall cognitive status: Within functional limits for tasks assessed and appears to have slightly delayed processing - see OT eval for details   SENSATION: WFL  COORDINATION: WFL's bil. LE's  POSTURE: No Significant postural limitations  LOWER EXTREMITY ROM:   WNL's bil. LE's   LOWER EXTREMITY MMT:  RLE WNL's  MMT Right Eval Left Eval  Hip flexion 5 4-  Hip extension 5 3-  Hip abduction  4  Hip adduction    Hip internal rotation    Hip external rotation    Knee flexion 5 4-  Knee extension 5 5  Ankle dorsiflexion 5 5  Ankle plantarflexion 5 5  Ankle inversion    Ankle eversion    (Blank rows = not tested)  BED MOBILITY:  Independent  TRANSFERS: Assistive device utilized: None  Sit to stand: Modified independence Stand to sit: Modified independence  RAMP: TBA  CURB: TBA  STAIRS: TBA   GAIT: Gait pattern: step through pattern and decreased arm swing- Left Distance walked: 100' Assistive device utilized: None Level of assistance: SBA Comments: mildly decreased Lt arm swing   FUNCTIONAL TESTS:  5 times sit to stand: 11.69 secs from chair without UE support Timed up and go (TUG): 10.87 secs without device 10 meter walk test: 11.68 secs without device = 2.81 ft/sec Berg Balance Scale: 54/56   PATIENT SURVEYS:  N/A due to dx of brain tumor                                                                                                                            TREATMENT DATE:  01/22/24  TherEx:  Bridging  x 5 reps, Lt unilateral bridge 10 reps   Bridging with marching 5 reps:  bridging with RLE extension 5 reps:  bridging with hip abdct./adduction 5 reps - rest breaks after each bridge exercise and cues to breathe during each rep of each exercise  Lt SLR - 3# weight 10 reps x 2 sets:   Lt hip flexion in hooklying position with 3# weight 10 reps;  Pt performed Lt SLR circles CW 10 reps, CCW 10 reps  Lt hip abduction in Rt sidelying position - 3# weight - 10 reps;  Clam shell exercise with 3# weight 10 reps In Rt sidelying - pt performed Lt hip flexion/extension with LLE held in abduction:  10 reps (no weight)  Prone Lt knee flexion with 3# weight 10 reps x 2 sets - cues for controlled descent Prone hip extension with knee flexed at 90 degrees with 3# weight LLE 10 reps;  Lt hip extension with knee extended with 3# weight 10 reps x 2 sets (weight  placed above knee)  Prone plank for core stabilization - 3 reps of 15 sec hold   Tall kneeling - mini squats 10 reps;  quadruped - lifting LLE in hip extended position - performed Lt knee flexion with 3# weight 10 reps with leg remaining extended with min assist prn during set of 10 reps Performed small pulses Lt foot toward ceiling with Lt knee flexed at 90 degrees - 10 reps  Pt performed step up exercise onto [employer] step with 3# weight on LLE - stepped up, lifted LLE up to 80- 90 degrees hip flexion with knee flexed at 90 degrees Hip abduction LLE with 3# weight - lateral step up with RLE, then lifting Lt leg in abduction  Standing - 3# weight on LLE - tapping 2nd step with Lt foot for Lt hip flexor strengthening and for improved balance as this exercise was performed without UE support with SBA;  pt consciously lifted LLE during descent to prevent foot dragging on step   Pt performed slow jog from steps back to 1st mat table with SBA at end of session, after completion of step exercise   PATIENT EDUCATION: Education details: additions of single leg  bridging and sidelying hip ABD rainbow heel taps to HEP, will continue to monitor BP and may have to reach out to PCP if it remains elevated  Person educated: Patient and wife Education method: Explanation, Demonstration, Verbal cues, and Handouts Education comprehension: verbalized understanding and returned demonstration  HOME EXERCISE PROGRAM: Access Code: [email or web address] URL: [email or web address] Date: 01/04/2024 Prepared by: Maebelle Munroe  Exercises - 2# weight used for SLR, knee flexion and clam shell and hip abduction  - Supine Active Straight Leg Raise  - 1 x daily - 7 x weekly - 3 sets - 10 reps - SIDELYING LEG LIFTS; ALSO DO CIRCLES CLOCKWISE & COUNTERCLOCKWISE  - 1 x daily - 7 x weekly - 3 sets - 10 reps - Prone Hip Extension  - 1 x daily - 7 x weekly - 3 sets - 10 reps - Clamshell  - 1 x daily - 7 x weekly - 3 sets - 10 reps - 5 sec hold - Prone Knee Flexion AROM  - 1 x daily - 7 x weekly - 3 sets - 10 reps - Single Leg Stance  - 1 x daily - 7 x weekly - 1 sets - 2-3 reps - 15 sec hold - Tandem Stance  - 1 x daily - 7 x weekly - 1 sets - 2 reps - 30 sec  hold - Romberg Stance Eyes Closed on Foam Pad  - 1 x daily - 5 x weekly - 3 sets - 30 hold - Narrow Stance with Eyes Closed and Head Rotation on Foam Pad  - 1 x daily - 5 x weekly - 2 sets - 10 reps - Single Leg Bridge  - 1 x daily - 5 x weekly - 2 sets - 10 reps  GOALS: Goals reviewed with patient? Yes  SHORT TERM GOALS: SAME AS LTG'S AS ELOS = 4 WEEKS     LONG TERM GOALS: Target date: 02-02-24  Assess FGA and set goal as appropriate. Baseline:  Goal status: INITIAL  2.  Improve Berg score to 56/56 to demo improved static standing balance. Baseline: 54/56 on 01-02-24 Goal status: INITIAL  3.  Pt will amb. 10" nonstop on various surfaces including grass, pavement and indoor, without LOB and with no c/o fatigue, demonstrating Lt arm swing, with supervision.  Baseline:  Goal status: INITIAL  4.  Increase  gait velocity to >/= 3.6 ft/sec without use of device for increased gait efficiency.  Baseline: 2.81 ft/sec (11.68 secs) Goal status: INITIAL  5.  Pt will jog 115' on flat, even surface independently without LOB and RPE </= 2/10.  Baseline:  Goal status: INITIAL  6.  Negotiate 4 steps using step over step sequence with supervision without use of hand rail to demo improved balance. Baseline:  Goal status: INITIAL  7.  Independent in HEP for LLE strengthening, balance and coordination.   Baseline:  Goal status: INITIAL  8.  Pt will improve composite score to above normal for age in order to demo improved balance.   Baseline: 64 (below normal)   Goal status: INITIAL  ASSESSMENT:  CLINICAL IMPRESSION: PT session focused on strengthening of LLE due to pt's report of feeling increased weakness in Lt leg following new onset seizure that occurred on 01-19-24.  Lt hip flexor MMT grade 4/5 (only very slight decrease noted compared to strength grade at initial eval); Lt hip abductors and hamstrings remain 4/5 in strength.  Pt tolerated exercises well with use of 3# weight used for PRE's.  Frequent short rest periods needed during session.  Cont with POC.     OBJECTIVE IMPAIRMENTS: Abnormal gait, decreased activity tolerance, decreased balance, decreased coordination, and decreased strength.   ACTIVITY LIMITATIONS: carrying, lifting, squatting, stairs, and locomotion level  PARTICIPATION LIMITATIONS: cleaning, laundry, driving, shopping, community activity, and occupation  PERSONAL FACTORS: Profession and 1 comorbidity: s/p craniotomy due to brain tumor  are also affecting patient's functional outcome.   REHAB POTENTIAL: Good  CLINICAL DECISION MAKING: Evolving/moderate complexity  EVALUATION COMPLEXITY: Moderate  PLAN:  PT FREQUENCY: 3x/week  PT DURATION: 4 weeks  PLANNED INTERVENTIONS: 97110-Therapeutic exercises, 97530- Therapeutic activity, 97112- Neuromuscular re-education,  [phone]- Self Care, [id number]- Gait training, [phone]- Aquatic Therapy, Patient/Family education, Balance training, and Stair training  PLAN FOR NEXT SESSION:   work on LLE strengthening, - Lt hip abductor , flexor and hip extensor strengthening:   elliptical, high level balance and strengthening, balance with EC/compliant surfaces  Jahnavi Muratore, Donavan Burnet, PT 01/26/2024, 7:50 PM

## 2024-01-26 ENCOUNTER — Other Ambulatory Visit: Payer: Self-pay

## 2024-01-26 ENCOUNTER — Ambulatory Visit
Admission: RE | Admit: 2024-01-26 | Discharge: 2024-01-26 | Disposition: A | Discharge status: Home / Self Care | Payer: Commercial Managed Care - PPO | Source: Ambulatory Visit | Attending: Radiation Oncology

## 2024-01-26 ENCOUNTER — Encounter: Payer: Self-pay | Admitting: Physical Therapy

## 2024-01-26 ENCOUNTER — Ambulatory Visit
Admission: RE | Admit: 2024-01-26 | Discharge: 2024-01-26 | Disposition: A | Discharge status: Home / Self Care | Payer: Commercial Managed Care - PPO | Source: Ambulatory Visit | Attending: Radiation Oncology | Admitting: Radiation Oncology

## 2024-01-26 DIAGNOSIS — Z510 Encounter for antineoplastic radiation therapy: Secondary | ICD-10-CM | POA: Diagnosis not present

## 2024-01-26 DIAGNOSIS — C711 Malignant neoplasm of frontal lobe: Secondary | ICD-10-CM

## 2024-01-26 LAB — RAD ONC ARIA SESSION SUMMARY
Course Elapsed Days: 4
Plan Fractions Treated to Date: 5
Plan Prescribed Dose Per Fraction: 2 Gy
Plan Total Fractions Prescribed: 23
Plan Total Prescribed Dose: 46 Gy
Reference Point Dosage Given to Date: 10 Gy
Reference Point Session Dosage Given: 2 Gy
Session Number: 5

## 2024-01-26 MED ORDER — SONAFINE EX EMUL
1.0000 | Freq: Once | CUTANEOUS | Status: AC
Start: 2024-01-26 — End: 2024-01-26
  Administered 2024-01-26: 1 via TOPICAL

## 2024-01-29 ENCOUNTER — Other Ambulatory Visit (HOSPITAL_COMMUNITY): Payer: Self-pay

## 2024-01-29 ENCOUNTER — Other Ambulatory Visit: Payer: Self-pay

## 2024-01-29 ENCOUNTER — Encounter: Payer: Self-pay | Admitting: Physical Therapy

## 2024-01-29 ENCOUNTER — Other Ambulatory Visit: Payer: Self-pay | Admitting: Internal Medicine

## 2024-01-29 ENCOUNTER — Ambulatory Visit: Payer: Commercial Managed Care - PPO | Attending: Physician Assistant | Admitting: Physical Therapy

## 2024-01-29 ENCOUNTER — Ambulatory Visit
Admission: RE | Admit: 2024-01-29 | Discharge: 2024-01-29 | Disposition: A | Discharge status: Home / Self Care | Payer: Commercial Managed Care - PPO | Source: Ambulatory Visit | Attending: Radiation Oncology | Admitting: Radiation Oncology

## 2024-01-29 ENCOUNTER — Ambulatory Visit: Payer: Commercial Managed Care - PPO | Admitting: Occupational Therapy

## 2024-01-29 DIAGNOSIS — R2681 Unsteadiness on feet: Secondary | ICD-10-CM | POA: Diagnosis present

## 2024-01-29 DIAGNOSIS — Z510 Encounter for antineoplastic radiation therapy: Secondary | ICD-10-CM | POA: Insufficient documentation

## 2024-01-29 DIAGNOSIS — R41842 Visuospatial deficit: Secondary | ICD-10-CM | POA: Insufficient documentation

## 2024-01-29 DIAGNOSIS — M6281 Muscle weakness (generalized): Secondary | ICD-10-CM

## 2024-01-29 DIAGNOSIS — I69252 Hemiplegia and hemiparesis following other nontraumatic intracranial hemorrhage affecting left dominant side: Secondary | ICD-10-CM | POA: Diagnosis present

## 2024-01-29 DIAGNOSIS — R29818 Other symptoms and signs involving the nervous system: Secondary | ICD-10-CM | POA: Diagnosis present

## 2024-01-29 DIAGNOSIS — G40909 Epilepsy, unspecified, not intractable, without status epilepticus: Secondary | ICD-10-CM | POA: Diagnosis not present

## 2024-01-29 DIAGNOSIS — R2689 Other abnormalities of gait and mobility: Secondary | ICD-10-CM | POA: Insufficient documentation

## 2024-01-29 DIAGNOSIS — R29898 Other symptoms and signs involving the musculoskeletal system: Secondary | ICD-10-CM | POA: Diagnosis present

## 2024-01-29 DIAGNOSIS — Z79899 Other long term (current) drug therapy: Secondary | ICD-10-CM | POA: Diagnosis not present

## 2024-01-29 DIAGNOSIS — R278 Other lack of coordination: Secondary | ICD-10-CM

## 2024-01-29 DIAGNOSIS — C711 Malignant neoplasm of frontal lobe: Secondary | ICD-10-CM | POA: Insufficient documentation

## 2024-01-29 LAB — RAD ONC ARIA SESSION SUMMARY
Course Elapsed Days: 7
Plan Fractions Treated to Date: 6
Plan Prescribed Dose Per Fraction: 2 Gy
Plan Total Fractions Prescribed: 23
Plan Total Prescribed Dose: 46 Gy
Reference Point Dosage Given to Date: 12 Gy
Reference Point Session Dosage Given: 2 Gy
Session Number: 6

## 2024-01-29 MED ORDER — APIXABAN 5 MG PO TABS
5.0000 mg | ORAL_TABLET | Freq: Two times a day (BID) | ORAL | 0 refills | Status: DC
  Filled 2024-01-29: qty 60, 30d supply, fill #0

## 2024-01-29 NOTE — Therapy (Signed)
OUTPATIENT OCCUPATIONAL THERAPY NEURO TREATMENT  Patient Name: Nicholas Stevens MRN: [id number] DOB:[birth year], 53 y.o., male Today's Date: 01/29/2024  PCP: Tresa Garter, MD REFERRING PROVIDER: Charlton Amor, PA-C   END OF SESSION:  OT End of Session - 01/29/24 1019     Visit Number 6    Number of Visits 16    Date for OT Re-Evaluation 03/22/24    Authorization Type [employer]    OT Start Time 1019    OT Stop Time 1100    OT Time Calculation (min) 41 min    Equipment Utilized During Treatment hand gripper    Activity Tolerance Patient tolerated treatment well    Behavior During Therapy WFL for tasks assessed/performed               Past Medical History:  Diagnosis Date   DDD (degenerative disc disease), lumbar    HNP (herniated nucleus pulposus)    Hypertension    Liver hemangioma    Morbid obesity (HCC)    OSA (obstructive sleep apnea)    no cpap used since weight loss   Overweight(278.02)    Plantar fasciitis of right foot    Sleep apnea    Tinea barbae    Past Surgical History:  Procedure Laterality Date   BREATH TEK H PYLORI N/A 08/20/2013   Procedure: BREATH TEK H PYLORI;  Surgeon: Valarie Merino, MD;  Location: [facility];  Service: General;  Laterality: N/A;   COLONOSCOPY WITH PROPOFOL N/A 05/05/2022   Procedure: COLONOSCOPY WITH PROPOFOL;  Surgeon: Beverley Fiedler, MD;  Location: [facility];  Service: Gastroenterology;  Laterality: N/A;   CRANIOTOMY Right 12/18/2023   Procedure: Right Frontal Stereotactic Craniotomy for Resection of Tumor;  Surgeon: Bedelia Person, MD;  Location: [facility];  Service: Neurosurgery;  Laterality: Right;   ESOPHAGOGASTRODUODENOSCOPY (EGD) WITH PROPOFOL N/A 03/15/2023   Procedure: ESOPHAGOGASTRODUODENOSCOPY (EGD) WITH PROPOFOL;  Surgeon: Iva Boop, MD;  Location: [facility];  Service: Gastroenterology;  Laterality: N/A;   LAPAROSCOPIC APPENDECTOMY  2007   Dr Carolynne Edouard   LAPAROSCOPIC GASTRIC SLEEVE  RESECTION N/A 10/08/2013   Procedure: LAPAROSCOPIC SLEEVE GASTRECTOMY;  Surgeon: Valarie Merino, MD;  Location: [facility];  Service: General;  Laterality: N/A;   LUMBAR LAMINECTOMY/DECOMPRESSION MICRODISCECTOMY N/A 09/16/2015   Procedure: MICRO LUMBAR DECOMPRESSION, MICRODISCECTOMY L5 - S1;  Surgeon: Jene Every, MD;  Location: [facility];  Service: Orthopedics;  Laterality: N/A;   POLYPECTOMY  05/05/2022   Procedure: POLYPECTOMY;  Surgeon: Beverley Fiedler, MD;  Location: [facility];  Service: Gastroenterology;;   ROTATOR CUFF REPAIR Right 2005   Dr Lajoyce Corners   Patient Active Problem List   Diagnosis Date Noted   Focal seizure (HCC) 01/22/2024   Allergic drug rash 12/29/2023   DVT, lower extremity, distal (HCC) 12/29/2023   Frontal glioblastoma (HCC) 12/26/2023   Brain mass 12/15/2023   Pseudofolliculitis barbae 05/01/2023   Iron deficiency 04/06/2023   Abdominal pain 03/15/2023   Microcytosis 03/14/2023   RUQ pain 03/12/2023   Abnormal EKG 03/12/2023   Chest pain 03/12/2023   Adult acne 01/19/2023   Obesity (BMI 30-39.9) 01/19/2023   Benign neoplasm of cecum    Benign neoplasm of ascending colon    Benign neoplasm of transverse colon    Difficult airway for intubation 01/18/2022   Upper respiratory disease 01/31/2019   Cerumen impaction 07/30/2018   HTN (hypertension) 11/03/2017   Spinal stenosis of lumbar region 09/16/2015   Insomnia 04/15/2015   Well adult exam  11/04/2014   H/O gastric sleeve 10/08/2013   Hypogonadism, male 07/17/2013   Erectile dysfunction 03/22/2013   Concentration deficit 04/27/2011   Attention or concentration deficit 04/27/2011   TINEA BARBAE 03/04/2009   LIVER HEMANGIOMA 03/04/2009   OVERWEIGHT-BMI 42 03/04/2009   OSA on CPAP 03/04/2009   DEGENERATIVE DISC DISEASE, LUMBAR SPINE 03/04/2009    ONSET DATE: 12/27/22 (referral date)  REFERRING DIAG: C71.9 (ICD-10-CM) - Malignant neoplasm of brain, unspecified   THERAPY DIAG:  Muscle weakness  (generalized)  Other lack of coordination  Visuospatial deficit  Hemiplegia and hemiparesis following other nontraumatic intracranial hemorrhage affecting left dominant side (HCC)  Rationale for Evaluation and Treatment: Rehabilitation  SUBJECTIVE:   SUBJECTIVE STATEMENT:  Pt & spouse report he has been staying busy - cooking, cleaning and walking.  He stood 12/13 hours yesterday according to his smart watch and he bought a hand gripper and worked on using it yesterday at home.   Pt accompanied by: self and significant other - Nicholas Stevens   PERTINENT HISTORY:  PMH includes: HTN, obesity (gastric sleeve), OSA, lumbar DDD.   53 yo male presenting 12/20 with decreased response time while driving, incoordination, imbalance. Pt was admitted to [facility] from ED on 12/15/23;  Imaging showed necrotic and hemorrhagic mass in the superior right frontal lobe with features of glioblastoma with mass effect. underwent craniotomy for brain tumor (with features of gliobastoma per chart note) on 12/18/23;  pt was transferred to inpatient rehab on 12/26/23 and discharged home with wife on 12/31/23.     Per 12/27/23 acute OT evaluation: "history of OSA, HTN, obesity s/p gastric sleeve, liver hemangioma who was admitted on 12/15/23 with reports of decrease in coordination, minimal use of LUE/LLE and fall prompting treatment. MRI brain done revealing necrotic, hemorrhagic mass in superior right frontal lobe with features of glioblastoma and mass effect with very early right uncal herniation.  He underwent right frontal stereotactic  craniotomy for resection of frontal lobe tumor on 12/23 by Dr. Maisie Fus.  Post op has had improvement in LUE strength and follow up MRI showed good debulking of tumor with decrease in mass effect, collapse of lesion with region of enhancement  and 5 mm right to left shift. Pathology revealed high grade glioma grade 4 c/w glioblastoma."  PRECAUTIONS: Fall and Other: No driving  WEIGHT  BEARING RESTRICTIONS: No  PAIN:  Are you having pain? No  FALLS: Has patient fallen in last 6 months? Yes. Number of falls 1   Pt reported he was working the day of the incident and noticed his reaction time was slow.  He just thought his cold medincine was making him slow, but he ended up falling into the nightstand and wall and couldn't get himself up due to L UE/LE weakness prompting ED visit and hospitalization.  LIVING ENVIRONMENT: Lives with: lives with their family, lives with their spouse (high school sweethearts), and lives with youngest 31 yo son - 1 of 3 children) Lives in: House Stairs: Yes: Internal: 14  steps; can reach both and External: 1 threshold  steps; none  Master bedroom on main level  - has not been up steps to 2nd level of home yet as only been home for 2 days  Has following equipment: has built in seat in walk-in shower but not using it   PLOF: Independent - has a home gym, worked in [employer] and had driven to/from [employer] on the day of the incident  PATIENT GOALS: To improve my  strength and balance.  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  HAND DOMINANCE: Right  ADLs: Overall ADLs: Pt has resumed all ADLs on his own, even standing in shower, managing fasteners although they may take a little longer.  IADLs: Shopping: Goes out with wife but unable to drive Light housekeeping: Wife isn't currently letting him Meal Prep: Wife is still doing meal prep although he was primary with this task previously Community mobility: Ind Medication management: Wife has it set up for him Financial management: Assisting wife, able to recall account numbers etc Handwriting: 100% legible - no change R UE  unaffected  MOBILITY STATUS: Independent  POSTURE COMMENTS:  forward head Sitting balance: WNL  ACTIVITY TOLERANCE: Activity tolerance: limited  FUNCTIONAL OUTCOME MEASURES: Lawton-Brody IADL Scale: 3/8    UPPER EXTREMITY ROM:    Active ROM Right eval Left eval  Shoulder flexion WNL Slight end range limitations  Shoulder abduction    Shoulder adduction    Shoulder extension    Shoulder internal rotation    Shoulder external rotation    Elbow flexion    Elbow extension    Wrist flexion    Wrist extension    Wrist ulnar deviation    Wrist radial deviation    Wrist pronation    Wrist supination    (Blank rows = not tested)  UPPER EXTREMITY MMT:     MMT Right eval Left eval  Shoulder flexion 5/5 4/5  Shoulder abduction    Shoulder adduction    Shoulder extension    Shoulder internal rotation    Shoulder external rotation    Middle trapezius    Lower trapezius    Elbow flexion    Elbow extension    Wrist flexion    Wrist extension    Wrist ulnar deviation    Wrist radial deviation    Wrist pronation    Wrist supination    (Blank rows = not tested)  HAND FUNCTION: Grip strength: Right: 60.4 - 1st attempt not included, 82.2, 86.1, 106.0  lbs; Left: 51.3, 54.8, 52.6 lbs Average: Right: 91.4 lbs Left: 52.9 lbs   01/22/24 -  Right , 96, 88, 99 Left 45, 47, 66, 47, 64  Average Right 94.3 lbs Left: 53.8 lbs  COORDINATION: 9 Hole Peg test: Right: 19.15 sec; Left: 32.18 sec Box and Blocks:  Right 51 blocks, Left 37 blocks  01/22/24 - 9 hole peg test - 23.05 sec Box and Blocks test - 53 blocks  SENSATION: WFL  EDEMA: NA  MUSCLE TONE: WFL  COGNITION: Overall cognitive status: Within functional limits for tasks assessed Recalled account number to bills independently VISION: Subjective report: Drops for glaucoma prevention/keep pressure down Baseline vision: Wears glasses for reading only Visual history:  NA  VISION ASSESSMENT: WFL  Patient has  difficulty with following activities due to following visual impairments: NA  PERCEPTION: Not tested  PRAXIS: Not tested  OBSERVATIONS at eval: Pt ambulated with use of no AE and no loss of balance but with mildly decreased L arm swing and what OTR still noted as a slight favoring of R side with gaze. The pt is well kept and was accompanied by his high school sweetheart/wife who did admit to overdoing it  when helping him at times when pointed out by OTR.                                                                                                                            TREATMENT DATE:   Therapeutic Activities  OT placed 18 colored clothespins (6 red, 6 blue and 6 black) around the gym for patient to locate.   Pt had to locate all 6 of 1 color before retrieving the next color for improved scanning, memory and concentration for locating items to promote visual neuro rehabilitation.  For additional challenge, therapist had matched the colored clothespin with similar colored objects around the gym with increased camouflage compared to last week including some some low and slightly obscured by objects. OT placed objects at all levels (high and low) and on both sides for scanning left and right as well as in areas requiring him to look under and behind objects.  Pt had no balance deficits even when backing up from objects. Pt was on his feet for >15 minutes without tiring.  Pt able to find 90% of the objects on his own with min cues to find a couple of objects he had previously located. Cues provided to use L hand to apply various resistance clothespins to the vertical post of clothespin container.    Pt engaged in scarf tossing activity while walking up and down the hallway, initially with one scarf on his own and then with 2 scarfs with OT ie) tossing them back and forth for him to catch out of the air and toss back to OT.  Red and green scarf used and pt encouraged to alternate passing and catching  scarfs tossed a a fairly rapid pace with OT.  Pt ambulating forwards throughout activity with no loss of balance and 100% success with catching scarfs tossed to him above waist height but alternating L/R and generally using his L hand throughout.   Therapeutic Exercises   Gripper set at level 55 lbs resistance to pick up blocks with L hand for sustained grip strength with good success stacking blocks x 4 in height x8 towers today with some dropping of blocks near the end but pt had worked his hand quite a bit yesterday at home.  He did well crossing midline as well as using the gripper to pick up 2 blocks at a time before retuning them to the bucket.  PATIENT EDUCATION: Education details: visual scanning activities Person educated: Patient and Spouse Education method: Explanation, Demonstration, and Verbal cues Education comprehension: verbalized understanding, returned demonstration, and needs further education  HOME EXERCISE PROGRAM: 01/11/24 - FM coordination HEP 01/12/24 - BUE strengthening Theraband - Access Code: [facility] 01/22/24 - Putty Exercises/activities - Access Code: [email or web address]  GOALS: Goals reviewed with patient? Yes   LONG TERM GOALS: Target date: 02/02/24  Patient will demonstrate initial LUE strength/coordination HEP with visual handouts only for proper execution to resume use of home gym including [employer] etc. Baseline: New to outpt OT Goal status: MET  2.  Patient will demonstrate at least 70+ lbs LUE grip strength as needed to open jars and other containers. Baseline: 52.9 lbs Goal status: IN Progress 01/22/24: Left: 53.8 lbs (greatest squeeze 66 lbs)  3.  Patient will demo improved FM coordination as evidenced by completing nine-hole peg with use of LUE in 25 seconds or less. Baseline: 32.18 Goal status: MET 01/22/24: Left 23.05 sec  4.  Pt will be able to place at least 42+ blocks using left hand with completion of Box and Blocks test. Baseline: 37 blocks Goal  status: MET 01/22/24: 53 blocks  5.  Patient will demonstrate at least 95% accuracy with environmental visual scanning in distracting environment to assist with ADLs and IADLS including eventual driving considerations and work-related tasks as Hydrographic surveyor.   Baseline: New to outpt OT Goal status: MET 01/24/24: Pt reported driving in neighborhood  6.  Pt will have adequate standing tolerance and balance to safely complete IADLS without supervision including preparing meals per PLOF. Baseline: Spouse is preparing meals. Brody-Lawton Scale 3/8 Goal status: MET 01/22/24: Spouse and wife reports he is making meals again.  7. Patient will demo improved BUE FM coordination as evidenced by independence with sorting medication on his own without dropping pills.  Baseline: Wife is assisting him Goal status: MET  ASSESSMENT:  CLINICAL IMPRESSION: Patient is a 53 y.o. male who was seen today for occupational therapy treatment for residual L UE deficits s/p glioblastoma and craniotomy. Patient did well on his feet > 15 minutes again today for visual scanning task combined with UE use to improve LUE and reaction times.  Pt will benefit from a few more skilled OT services in the outpatient setting to work on strength and motor control of LUE to help pt return to PLOF as able.   [name]   PERFORMANCE DEFICITS: in functional skills including IADLs, coordination, dexterity, proprioception, ROM, strength, Fine motor control, Gross motor control, balance, endurance, vision, and UE functional use, cognitive skills including attention, and psychosocial skills including coping strategies and routines and behaviors.   IMPAIRMENTS: are limiting patient from IADLs, work, leisure, and social participation.   CO-MORBIDITIES: may have co-morbidities  that affects occupational performance. Patient will benefit from skilled OT to address above impairments and improve overall function.  REHAB POTENTIAL:  Excellent   PLAN:  OT FREQUENCY: 1-2x/week  OT DURATION: up to 6 weeks but planning for 4 weeks  PLANNED INTERVENTIONS: 97535 self care/ADL training, 16109 therapeutic exercise, 97530 therapeutic activity, 97112 neuromuscular re-education, functional mobility training, coping strategies training, and patient/family education  RECOMMENDED OTHER SERVICES: PT evaluation occurred date of eval also  CONSULTED AND AGREED WITH PLAN OF CARE: Patient and family member/caregiver  PLAN FOR NEXT SESSION:  Strengthening HEP - progress HEP as tolerated Body blade - LUE especially Coordination activities - progress HEP as tolerated Scanning with distractions/Reaction times - Blaze Pods Standing balance and activity tolerance for cooking  Typing assessment and other work related tasks   Victorino Sparrow, OT 01/29/2024, 12:08 PM

## 2024-01-29 NOTE — Therapy (Signed)
OUTPATIENT PHYSICAL THERAPY NEURO TREATMENT NOTE   Patient Name: Nicholas Stevens MRN: [id number] DOB:12/27/1970, 53 y.o., male Today's Date: 01/29/2024   PCP: Jenelle Mages., MD REFERRING PROVIDER: Charlton Amor, PA-C  END OF SESSION:  PT End of Session - 01/29/24 1941     Visit Number 10    Number of Visits 13    Date for PT Re-Evaluation 02/02/24    Authorization Type [employer]    Authorization Time Period 01-02-24 - 03-01-24    PT Start Time 1103    PT Stop Time 1149    PT Time Calculation (min) 46 min    Activity Tolerance Patient tolerated treatment well    Behavior During Therapy WFL for tasks assessed/performed                 Past Medical History:  Diagnosis Date   DDD (degenerative disc disease), lumbar    HNP (herniated nucleus pulposus)    Hypertension    Liver hemangioma    Morbid obesity (HCC)    OSA (obstructive sleep apnea)    no cpap used since weight loss   Overweight(278.02)    Plantar fasciitis of right foot    Sleep apnea    Tinea barbae    Past Surgical History:  Procedure Laterality Date   BREATH TEK H PYLORI N/A 08/20/2013   Procedure: BREATH TEK Richardo Priest;  Surgeon: Valarie Merino, MD;  Location: [facility];  Service: General;  Laterality: N/A;   COLONOSCOPY WITH PROPOFOL N/A 05/05/2022   Procedure: COLONOSCOPY WITH PROPOFOL;  Surgeon: Beverley Fiedler, MD;  Location: [facility];  Service: Gastroenterology;  Laterality: N/A;   CRANIOTOMY Right 12/18/2023   Procedure: Right Frontal Stereotactic Craniotomy for Resection of Tumor;  Surgeon: Bedelia Person, MD;  Location: [facility] OR;  Service: Neurosurgery;  Laterality: Right;   ESOPHAGOGASTRODUODENOSCOPY (EGD) WITH PROPOFOL N/A 03/15/2023   Procedure: ESOPHAGOGASTRODUODENOSCOPY (EGD) WITH PROPOFOL;  Surgeon: Iva Boop, MD;  Location: [facility];  Service: Gastroenterology;  Laterality: N/A;   LAPAROSCOPIC APPENDECTOMY  2007   Dr Carolynne Edouard   LAPAROSCOPIC GASTRIC SLEEVE RESECTION  N/A 10/08/2013   Procedure: LAPAROSCOPIC SLEEVE GASTRECTOMY;  Surgeon: Valarie Merino, MD;  Location: [facility];  Service: General;  Laterality: N/A;   LUMBAR LAMINECTOMY/DECOMPRESSION MICRODISCECTOMY N/A 09/16/2015   Procedure: MICRO LUMBAR DECOMPRESSION, MICRODISCECTOMY L5 - S1;  Surgeon: Jene Every, MD;  Location: [facility];  Service: Orthopedics;  Laterality: N/A;   POLYPECTOMY  05/05/2022   Procedure: POLYPECTOMY;  Surgeon: Beverley Fiedler, MD;  Location: [facility];  Service: Gastroenterology;;   ROTATOR CUFF REPAIR Right 2005   Dr Lajoyce Corners   Patient Active Problem List   Diagnosis Date Noted   Focal seizure (HCC) 01/22/2024   Allergic drug rash 12/29/2023   DVT, lower extremity, distal (HCC) 12/29/2023   Frontal glioblastoma (HCC) 12/26/2023   Brain mass 12/15/2023   Pseudofolliculitis barbae 05/01/2023   Iron deficiency 04/06/2023   Abdominal pain 03/15/2023   Microcytosis 03/14/2023   RUQ pain 03/12/2023   Abnormal EKG 03/12/2023   Chest pain 03/12/2023   Adult acne 01/19/2023   Obesity (BMI 30-39.9) 01/19/2023   Benign neoplasm of cecum    Benign neoplasm of ascending colon    Benign neoplasm of transverse colon    Difficult airway for intubation 01/18/2022   Upper respiratory disease 01/31/2019   Cerumen impaction 07/30/2018   HTN (hypertension) 11/03/2017   Spinal stenosis of lumbar region 09/16/2015   Insomnia 04/15/2015  Well adult exam 11/04/2014   H/O gastric sleeve 10/08/2013   Hypogonadism, male 07/17/2013   Erectile dysfunction 03/22/2013   Concentration deficit 04/27/2011   Attention or concentration deficit 04/27/2011   TINEA BARBAE 03/04/2009   LIVER HEMANGIOMA 03/04/2009   OVERWEIGHT-BMI 42 03/04/2009   OSA on CPAP 03/04/2009   DEGENERATIVE DISC DISEASE, LUMBAR SPINE 03/04/2009    ONSET DATE: 12-15-23  REFERRING DIAG: C71.9 (ICD-10-CM) - Malignant neoplasm of brain, unspecified  THERAPY DIAG:  Muscle weakness (generalized)  Unsteadiness on  feet  Rationale for Evaluation and Treatment: Rehabilitation  SUBJECTIVE:                                                                                                                                                                                             SUBJECTIVE STATEMENT: Pt reports he did fine without having taken Ativan prior to radiation treatment on Friday;  pt reports he has been working on exercises at home for strengthening.  Wife states she would like for pt to continue with PT after this week but states it is up to DJ - says they will talk about it.  Discussed progress made in PT - explained that pt is doing very well and that it may possibly be maintenance if continued - but effects of continued radiation unknown (5 more weeks of radiation scheduled).  Informed both pt and wife that we would assess SOT and LTG's and collaborate on decision.    Per chart note:  53 yo male presenting 12/20 to ED with decreased response time while driving, incoordination, imbalance. Imaging showed necrotic and hemorrhagic mass in the superior right frontal lobe with features of glioblastoma with mass effect. S/p R craniotomy 12/23. PMH includes: obesity and OSA.   Pt accompanied by:  wife  - [name]  PERTINENT HISTORY: history of OSA, HTN, Tinea barbae, obesity s/p gastric sleeve, liver hemangioma who was admitted on 12/15/23 with reports of decrease in coordination, minimal use of LUE/LLE and; hospital admission 12-15-23 with transfer to inpatient rehab 12-26-23 - 12-31-23  PAIN:  Are you having pain? No   PRECAUTIONS: Other: No driving; Fall  RED FLAGS: None   WEIGHT BEARING RESTRICTIONS: No  FALLS: Has patient fallen in last 6 months? No  LIVING ENVIRONMENT: Lives with: lives with their spouse Lives in: House/apartment Stairs: Yes: Internal: 12 steps; can reach both master bedroom on main level  - has not been up steps to 2nd level of home yet as only been home for 2 days Has following  equipment at home: None  PLOF: Independent; pt retired from [employer] 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  DIAGNOSTIC FINDINGS: MRI brain done revealing necrotic, hemorrhagic mass in superior right frontal lobe with features of glioblastoma and mass effect with very early right uncal herniation.  COGNITION: Overall cognitive status: Within functional limits for tasks assessed and appears to have slightly delayed processing - see OT eval for details   SENSATION: WFL  COORDINATION: WFL's bil. LE's  POSTURE: No Significant postural limitations  LOWER EXTREMITY ROM:   WNL's bil. LE's   LOWER EXTREMITY MMT:  RLE WNL's  MMT Right Eval Left Eval  Hip flexion 5 4-  Hip extension 5 3-  Hip abduction  4  Hip adduction    Hip internal rotation    Hip external rotation    Knee flexion 5 4-  Knee extension 5 5  Ankle dorsiflexion 5 5  Ankle plantarflexion 5 5  Ankle inversion    Ankle eversion    (Blank rows = not tested)  BED MOBILITY:  Independent  TRANSFERS: Assistive device utilized: None  Sit to stand: Modified independence Stand to sit: Modified independence  RAMP: TBA  CURB: TBA  STAIRS: TBA   GAIT: Gait pattern: step through pattern and decreased arm swing- Left Distance walked: 100' Assistive device utilized: None Level of assistance: SBA Comments: mildly decreased Lt arm swing   FUNCTIONAL TESTS:  5 times sit to stand: 11.69 secs from chair without UE support Timed up and go (TUG): 10.87 secs without device 10 meter walk test: 11.68 secs without device = 2.81 ft/sec Berg Balance Scale: 54/56   PATIENT  SURVEYS:  N/A due to dx of brain tumor                                                                                                                            TREATMENT DATE:  01-29-24  TherEx:   Bridging x 5 reps;  Lt unilateral bridge 10 reps Bridging with marching 5 reps:  bridging with RLE extension 5 reps:  bridging with hip abdct./adduction 5 reps - rest breaks after each bridge exercise and cues to perform slowly as pt has tendency to perform exercises quickly  Lt SLR - 3# weight 10 reps;   Lt hip flexion in hooklying position with 3# weight 10 reps;  Pt performed Lt SLR circles CW 10 reps, CCW 10 reps  Lt hip extension control exercise with 3# weight 10 reps - lifting LLE off/on edge of mat  Lt hip abduction in Rt sidelying position - 3# weight - 10 reps;  Clam shell exercise with 3# weight 10 reps In Rt sidelying - pt performed Lt hip flexion/extension with LLE  held in abduction:  10 reps (no weight)  Prone Lt knee flexion with 4# weight 10 reps x 2 sets - cues for controlled descent Prone hip extension with knee flexed at 90 degrees with 3# weight x 10 reps;   Lt hip ext with knee extended with 3# weight 10 reps  Quadruped position - Lt hip flexion/extension 10 reps; Lt knee flexion/extension in hip extended position 10 reps;  Lt hip extension with knee flexed at 90 degrees, small pulses 10 reps  Elliptical level 2.5 1" forward, 1" backward for endurance training  TherAct: Pt performed resisted amb. With use of blue theraband for strengthening and for improved dynamic balance -approx. 35' x 2 reps forward, sideways, and backwards with CGA   Sidestepping with squats 30' x 2 reps - cues to perform slowly   3# on LLE - tap ups to 2nd step 10 reps;  tap ups to 3rd step 10 reps;  lateral step ups to 6" step with RLE with LLE abduction with knee extended  Pt performed tandem gait - 12 steps with SBA   PATIENT EDUCATION: Education details: HEP update Person educated: Patient  and wife Education method: Explanation, Demonstration, Verbal cues, and Handouts Education comprehension: verbalized understanding and returned demonstration  HOME EXERCISE PROGRAM: Access Code: [email or web address] URL: [email or web address] Date: 01/04/2024 Prepared by: Maebelle Munroe  Exercises - 2# weight used for SLR, knee flexion and clam shell and hip abduction  - Supine Active Straight Leg Raise  - 1 x daily - 7 x weekly - 3 sets - 10 reps - SIDELYING LEG LIFTS; ALSO DO CIRCLES CLOCKWISE & COUNTERCLOCKWISE  - 1 x daily - 7 x weekly - 3 sets - 10 reps - Prone Hip Extension  - 1 x daily - 7 x weekly - 3 sets - 10 reps - Clamshell  - 1 x daily - 7 x weekly - 3 sets - 10 reps - 5 sec hold - Prone Knee Flexion AROM  - 1 x daily - 7 x weekly - 3 sets - 10 reps - Single Leg Stance  - 1 x daily - 7 x weekly - 1 sets - 2-3 reps - 15 sec hold - Tandem Stance  - 1 x daily - 7 x weekly - 1 sets - 2 reps - 30 sec  hold - Romberg Stance Eyes Closed on Foam Pad  - 1 x daily - 5 x weekly - 3 sets - 30 hold - Narrow Stance with Eyes Closed and Head Rotation on Foam Pad  - 1 x daily - 5 x weekly - 2 sets - 10 reps - Single Leg Bridge  - 1 x daily - 5 x weekly - 2 sets - 10 reps  GOALS: Goals reviewed with patient? Yes  SHORT TERM GOALS: SAME AS LTG'S AS ELOS = 4 WEEKS     LONG TERM GOALS: Target date: 02-02-24  Assess FGA and set goal as appropriate. Baseline: score 30/30 on 01-29-24 Goal status: Goal met 01-29-24  2.  Improve Berg score to 56/56 to demo improved static standing balance. Baseline: 54/56 on 01-02-24 Goal status: INITIAL  3.  Pt will amb. 10" nonstop on various surfaces including grass, pavement and indoor, without LOB and with no c/o fatigue, demonstrating Lt arm swing, with supervision.  Baseline:  Goal status: Goal met 01-29-24  4.  Increase gait velocity to >/= 3.6 ft/sec without use of device for increased gait efficiency.  Baseline: 2.81 ft/sec (11.68 secs) Goal  status:  INITIAL  5.  Pt will jog 115' on flat, even surface independently without LOB and RPE </= 2/10.  Baseline:  Goal status: INITIAL  6.  Negotiate 4 steps using step over step sequence with supervision without use of hand rail to demo improved balance. Baseline:  Goal status: INITIAL  7.  Independent in HEP for LLE strengthening, balance and coordination.   Baseline:  Goal status: INITIAL  8.  Pt will improve composite score to above normal for age in order to demo improved balance.   Baseline: 64 (below normal)   Goal status: INITIAL  ASSESSMENT:  CLINICAL IMPRESSION: PT session focused on LLE strengthening exercises and LTG assessment in preparation for planned D/C vs. Renewal at end of this week.  Pt has met LTG #1 with FGA score 30/30;  LTG #3 met per pt report with pt stating he has ambulated on grass and unlevel surfaces without any difficulty.  Pt is progressing well with gait and balance WFL's, with SOT to be reassessed at next PT session.  Pt has 5 additional weeks of radiation/chemo scheduled and requests to possibly continue with PT to increase strength and endurance.  Cont with POC.     OBJECTIVE IMPAIRMENTS: Abnormal gait, decreased activity tolerance, decreased balance, decreased coordination, and decreased strength.   ACTIVITY LIMITATIONS: carrying, lifting, squatting, stairs, and locomotion level  PARTICIPATION LIMITATIONS: cleaning, laundry, driving, shopping, community activity, and occupation  PERSONAL FACTORS: Profession and 1 comorbidity: s/p craniotomy due to brain tumor  are also affecting patient's functional outcome.   REHAB POTENTIAL: Good  CLINICAL DECISION MAKING: Evolving/moderate complexity  EVALUATION COMPLEXITY: Moderate  PLAN:  PT FREQUENCY: 3x/week  PT DURATION: 4 weeks  PLANNED INTERVENTIONS: 97110-Therapeutic exercises, 97530- Therapeutic activity, 97112- Neuromuscular re-education, [phone]- Self Care, 60454- Gait training, [phone]-  Aquatic Therapy, Patient/Family education, Balance training, and Stair training  PLAN FOR NEXT SESSION: Please redo SOT - begin checking remaining LTG's - pt may want to continue PT during radiation schedule of 5 more weeks (decreased to 1x/week if continued)    work on LLE strengthening, - Lt hip abductor , flexor and hip extensor strengthening:   elliptical, high level balance and strengthening, balance with EC/compliant surfaces  Oaklan Persons, Donavan Burnet, PT 01/29/2024, 7:43 PM

## 2024-01-30 ENCOUNTER — Other Ambulatory Visit: Payer: Self-pay

## 2024-01-30 ENCOUNTER — Ambulatory Visit
Admission: RE | Admit: 2024-01-30 | Discharge: 2024-01-30 | Disposition: A | Discharge status: Home / Self Care | Payer: Commercial Managed Care - PPO | Source: Ambulatory Visit | Attending: Radiation Oncology | Admitting: Radiation Oncology

## 2024-01-30 ENCOUNTER — Encounter: Payer: Self-pay | Admitting: Internal Medicine

## 2024-01-30 ENCOUNTER — Ambulatory Visit: Payer: Commercial Managed Care - PPO | Admitting: Internal Medicine

## 2024-01-30 ENCOUNTER — Inpatient Hospital Stay: Payer: Commercial Managed Care - PPO | Admitting: Licensed Clinical Social Worker

## 2024-01-30 VITALS — BP 130/80 | HR 95 | Temp 98.0°F | Ht 74.0 in | Wt 272.0 lb

## 2024-01-30 DIAGNOSIS — G4700 Insomnia, unspecified: Secondary | ICD-10-CM | POA: Diagnosis not present

## 2024-01-30 DIAGNOSIS — C711 Malignant neoplasm of frontal lobe: Secondary | ICD-10-CM

## 2024-01-30 DIAGNOSIS — G40909 Epilepsy, unspecified, not intractable, without status epilepticus: Secondary | ICD-10-CM | POA: Diagnosis not present

## 2024-01-30 DIAGNOSIS — Z510 Encounter for antineoplastic radiation therapy: Secondary | ICD-10-CM | POA: Diagnosis not present

## 2024-01-30 LAB — RAD ONC ARIA SESSION SUMMARY
Course Elapsed Days: 8
Plan Fractions Treated to Date: 7
Plan Prescribed Dose Per Fraction: 2 Gy
Plan Total Fractions Prescribed: 23
Plan Total Prescribed Dose: 46 Gy
Reference Point Dosage Given to Date: 14 Gy
Reference Point Session Dosage Given: 2 Gy
Session Number: 7

## 2024-01-30 MED ORDER — ZOLPIDEM TARTRATE 10 MG PO TABS: ORAL_TABLET | ORAL | 1 refills | Status: DC

## 2024-01-30 MED ORDER — PANTOPRAZOLE SODIUM 40 MG PO TBEC: 40.0000 mg | DELAYED_RELEASE_TABLET | Freq: Every day | ORAL | 3 refills | Status: DC

## 2024-01-30 NOTE — Patient Instructions (Addendum)
Use [employer] Peroxicare tooth paste  

## 2024-01-30 NOTE — Progress Notes (Signed)
 Subjective:  Patient ID: Nicholas Stevens, male    DOB: Feb 04, 1971  Age: 53 y.o. MRN: [id number]  CC: Hospitalization Follow-up   HPI Nicholas Stevens presents for brain tumor - XRT x 6 wks, oral chemo. On steroids taper. F/u on s/p craniotomy with resection of frontal glioblastoma; Dr. Debby, glioblastoma, 12/18/23 Keppra  was stopped 1/13 seizure re-occurred.... Back on seizure rx F/u on GERD, insomnia - occ CP when laying on the side Here w/wife   Outpatient Medications Prior to Visit  Medication Sig Dispense Refill   apixaban  (ELIQUIS ) 5 MG TABS tablet Take 1 tablet (5 mg total) by mouth 2 (two) times daily. 60 tablet 0   camphor-menthol  (SARNA) lotion Apply topically as needed for itching. 222 mL 0   Cyanocobalamin  (B-12 PO) Take 1 tablet by mouth daily.     dexamethasone  (DECADRON ) 1 MG tablet Take 3 tablets (3 mg total) by mouth daily with breakfast for 7 days, THEN 2 tablets (2 mg total) daily with breakfast for 7 days, THEN 1 tablet (1 mg total) daily with breakfast for 7 days. 42 tablet 0   latanoprost  (XALATAN ) 0.005 % ophthalmic solution Place 1 drop into both eyes at bedtime.     levETIRAcetam  (KEPPRA ) 500 MG tablet Take 1 tablet (500 mg total) by mouth 2 (two) times daily. 60 tablet 0   Multiple Vitamin (MULTIVITAMIN WITH MINERALS) TABS tablet Take 1 tablet by mouth daily.     Omega-3 Fatty Acids (FISH OIL) 1000 MG CAPS Take 1,000 mg by mouth daily.     ondansetron  (ZOFRAN ) 8 MG tablet Take 1 tablet (8 mg total) by mouth every 8 (eight) hours as needed for nausea or vomiting. May take 30-60 minutes prior to Temodar  administration if nausea/vomiting occurs as needed. 30 tablet 1   pimecrolimus  (ELIDEL ) 1 % cream Apply 1 Application topically 2 (two) times daily as needed (Dermatitis).     senna-docusate (SENOKOT-S) 8.6-50 MG tablet Take 2 tablets by mouth daily at 6 (six) AM. 60 tablet 0   temozolomide  (TEMODAR ) 180 MG capsule Take 1 capsule (180 mg total) by mouth  daily. May take on an empty stomach to decrease nausea & vomiting. 42 capsule 0   triamcinolone  cream (KENALOG ) 0.1 % Apply 1 Application topically daily. Use on affected areas for 2 weeks, then stop for 2 weeks. Repeat until clear if needed. 453 g 0   fexofenadine  (ALLEGRA ) 60 MG tablet Take 1 tablet (60 mg total) by mouth 2 (two) times daily. 60 tablet 0   LORazepam  (ATIVAN ) 1 MG tablet Take 0.5 tablets (0.5 mg total) by mouth every 8 (eight) hours. 45 tablet 0   pantoprazole  (PROTONIX ) 40 MG tablet Take 1 tablet (40 mg total) by mouth daily. 30 tablet 0   zolpidem  (AMBIEN ) 10 MG tablet TAKE 1 TABLET BY MOUTH EVERYDAY AT BEDTIME 90 tablet 1   No facility-administered medications prior to visit.    ROS: Review of Systems  Constitutional:  Negative for appetite change, fatigue and unexpected weight change.  HENT:  Negative for congestion, nosebleeds, sneezing, sore throat and trouble swallowing.   Eyes:  Negative for itching and visual disturbance.  Respiratory:  Negative for cough.   Cardiovascular:  Negative for chest pain, palpitations and leg swelling.  Gastrointestinal:  Negative for abdominal distention, blood in stool, diarrhea and nausea.  Genitourinary:  Negative for frequency and hematuria.  Musculoskeletal:  Negative for arthralgias, back pain, gait problem, joint swelling and neck pain.  Skin:  Negative for  rash.  Neurological:  Negative for dizziness, tremors, speech difficulty and weakness.  Psychiatric/Behavioral:  Negative for agitation, dysphoric mood and sleep disturbance. The patient is not nervous/anxious.     Objective:  BP 130/80 (BP Location: Left Arm, Patient Position: Sitting, Cuff Size: Normal)   Pulse 95   Temp 98 F (36.7 C) (Oral)   Ht 6' 2 (1.88 m)   Wt 272 lb (123.4 kg)   SpO2 95%   BMI 34.92 kg/m   BP Readings from Last 3 Encounters:  02/02/24 133/85  02/01/24 132/89  01/31/24 (!) 138/98    Wt Readings from Last 3 Encounters:  02/01/24 272  lb 14.4 oz (123.8 kg)  01/30/24 272 lb (123.4 kg)  01/22/24 271 lb 12.8 oz (123.3 kg)    Physical Exam Constitutional:      General: He is not in acute distress.    Appearance: He is well-developed. He is obese.     Comments: NAD  Eyes:     Conjunctiva/sclera: Conjunctivae normal.     Pupils: Pupils are equal, round, and reactive to light.  Neck:     Thyroid : No thyromegaly.     Vascular: No JVD.  Cardiovascular:     Rate and Rhythm: Normal rate and regular rhythm.     Heart sounds: Normal heart sounds. No murmur heard.    No friction rub. No gallop.  Pulmonary:     Effort: Pulmonary effort is normal. No respiratory distress.     Breath sounds: Normal breath sounds. No wheezing or rales.  Chest:     Chest wall: No tenderness.  Abdominal:     General: Bowel sounds are normal. There is no distension.     Palpations: Abdomen is soft. There is no mass.     Tenderness: There is no abdominal tenderness. There is right CVA tenderness. There is no guarding or rebound.  Musculoskeletal:        General: No tenderness. Normal range of motion.     Cervical back: Normal range of motion.  Lymphadenopathy:     Cervical: No cervical adenopathy.  Skin:    General: Skin is warm and dry.     Findings: No rash.  Neurological:     Mental Status: He is alert and oriented to person, place, and time.     Cranial Nerves: No cranial nerve deficit.     Motor: Weakness present. No abnormal muscle tone.     Coordination: Coordination normal.     Gait: Gait normal.     Deep Tendon Reflexes: Reflexes are normal and symmetric.  Psychiatric:        Behavior: Behavior normal.        Thought Content: Thought content normal.        Judgment: Judgment normal.   Mild LUE weakness  Lab Results  Component Value Date   WBC 10.7 (H) 02/01/2024   HGB 17.6 (H) 02/01/2024   HCT 53.7 (H) 02/01/2024   PLT 209 02/01/2024   GLUCOSE 92 02/01/2024   CHOL 171 01/19/2023   TRIG 117.0 01/19/2023   HDL 29.10  (L) 01/19/2023   LDLDIRECT 116.0 10/26/2020   LDLCALC 119 (H) 01/19/2023   ALT 27 02/01/2024   AST 13 (L) 02/01/2024   NA 139 02/01/2024   K 4.2 02/01/2024   CL 104 02/01/2024   CREATININE 1.12 02/01/2024   BUN 16 02/01/2024   CO2 32 02/01/2024   TSH 0.715 03/12/2023   PSA 1.01 01/19/2023   INR 1.0 12/15/2023  HGBA1C 5.4 12/15/2023    No results found.  Assessment & Plan:   Problem List Items Addressed This Visit     Insomnia   Relevant Medications   zolpidem  (AMBIEN ) 10 MG tablet   Frontal glioblastoma (HCC) - Primary   On XRT x 6 wks, oral chemo. On steroids taper. S/p craniotomy with resection of frontal glioblastoma; Dr. Debby, glioblastoma, 12/18/23 Keppra  was stopped 1/13 seizure re-occurred.... Back on seizure rx      Seizure disorder (HCC)   On XRT x 6 wks, oral chemo. On steroids taper. S/p craniotomy with resection of frontal glioblastoma; Dr. Debby, glioblastoma, 12/18/23 Keppra  was stopped 1/13 seizure re-occurred.... Back on seizure rx - Keppra       Relevant Medications   zolpidem  (AMBIEN ) 10 MG tablet      Meds ordered this encounter  Medications   zolpidem  (AMBIEN ) 10 MG tablet    Sig: TAKE 1 TABLET BY MOUTH EVERYDAY AT BEDTIME    Dispense:  90 tablet    Refill:  1    This request is for a new prescription for a controlled substance as required by Federal/State law.   pantoprazole  (PROTONIX ) 40 MG tablet    Sig: Take 1 tablet (40 mg total) by mouth daily.    Dispense:  90 tablet    Refill:  3      Follow-up: Return in about 3 months (around 04/28/2024) for a follow-up visit.  Marolyn Noel, MD

## 2024-01-31 ENCOUNTER — Encounter: Payer: Self-pay | Admitting: Physical Therapy

## 2024-01-31 ENCOUNTER — Ambulatory Visit: Payer: Commercial Managed Care - PPO | Admitting: Physical Therapy

## 2024-01-31 ENCOUNTER — Ambulatory Visit
Admission: RE | Admit: 2024-01-31 | Discharge: 2024-01-31 | Disposition: A | Discharge status: Home / Self Care | Payer: Commercial Managed Care - PPO | Source: Ambulatory Visit | Attending: Radiation Oncology | Admitting: Radiation Oncology

## 2024-01-31 ENCOUNTER — Ambulatory Visit: Payer: Commercial Managed Care - PPO | Admitting: Occupational Therapy

## 2024-01-31 ENCOUNTER — Other Ambulatory Visit: Payer: Self-pay

## 2024-01-31 VITALS — BP 138/98 | HR 86

## 2024-01-31 DIAGNOSIS — R2689 Other abnormalities of gait and mobility: Secondary | ICD-10-CM

## 2024-01-31 DIAGNOSIS — I69252 Hemiplegia and hemiparesis following other nontraumatic intracranial hemorrhage affecting left dominant side: Secondary | ICD-10-CM

## 2024-01-31 DIAGNOSIS — R29818 Other symptoms and signs involving the nervous system: Secondary | ICD-10-CM

## 2024-01-31 DIAGNOSIS — Z510 Encounter for antineoplastic radiation therapy: Secondary | ICD-10-CM | POA: Diagnosis not present

## 2024-01-31 DIAGNOSIS — R278 Other lack of coordination: Secondary | ICD-10-CM

## 2024-01-31 DIAGNOSIS — M6281 Muscle weakness (generalized): Secondary | ICD-10-CM | POA: Diagnosis not present

## 2024-01-31 DIAGNOSIS — R2681 Unsteadiness on feet: Secondary | ICD-10-CM

## 2024-01-31 DIAGNOSIS — R41842 Visuospatial deficit: Secondary | ICD-10-CM

## 2024-01-31 LAB — RAD ONC ARIA SESSION SUMMARY
Course Elapsed Days: 9
Plan Fractions Treated to Date: 8
Plan Prescribed Dose Per Fraction: 2 Gy
Plan Total Fractions Prescribed: 23
Plan Total Prescribed Dose: 46 Gy
Reference Point Dosage Given to Date: 16 Gy
Reference Point Session Dosage Given: 2 Gy
Session Number: 8

## 2024-01-31 NOTE — Therapy (Signed)
 OUTPATIENT PHYSICAL THERAPY NEURO TREATMENT NOTE   Patient Name: Nicholas Stevens MRN: [id number] DOB:1971-03-27, 53 y.o., male Today's Date: 01/31/2024   PCP: Loman Karlynn GAILS., MD REFERRING PROVIDER: Pegge Toribio PARAS, PA-C  END OF SESSION:  PT End of Session - 01/31/24 0934     Visit Number 11    Number of Visits 13    Date for PT Re-Evaluation 02/02/24    Authorization Type [employer]    Authorization Time Period 01-02-24 - 03-01-24    PT Start Time 0932    PT Stop Time 1015    PT Time Calculation (min) 43 min    Equipment Utilized During Treatment Gait belt    Activity Tolerance Patient tolerated treatment well    Behavior During Therapy WFL for tasks assessed/performed            Past Medical History:  Diagnosis Date   DDD (degenerative disc disease), lumbar    HNP (herniated nucleus pulposus)    Hypertension    Liver hemangioma    Morbid obesity (HCC)    OSA (obstructive sleep apnea)    no cpap used since weight loss   Overweight(278.02)    Plantar fasciitis of right foot    Sleep apnea    Tinea barbae    Past Surgical History:  Procedure Laterality Date   BREATH TEK H PYLORI N/A 08/20/2013   Procedure: BREATH TEK VEAR [name];  Surgeon: Donnice KATHEE Lunger, MD;  Location: [facility];  Service: General;  Laterality: N/A;   COLONOSCOPY WITH PROPOFOL  N/A 05/05/2022   Procedure: COLONOSCOPY WITH PROPOFOL ;  Surgeon: Albertus Gordy HERO, MD;  Location: [facility];  Service: Gastroenterology;  Laterality: N/A;   CRANIOTOMY Right 12/18/2023   Procedure: Right Frontal Stereotactic Craniotomy for Resection of Tumor;  Surgeon: Debby Dorn MATSU, MD;  Location: [facility];  Service: Neurosurgery;  Laterality: Right;   ESOPHAGOGASTRODUODENOSCOPY (EGD) WITH PROPOFOL  N/A 03/15/2023   Procedure: ESOPHAGOGASTRODUODENOSCOPY (EGD) WITH PROPOFOL ;  Surgeon: Avram Lupita BRAVO, MD;  Location: [facility];  Service: Gastroenterology;  Laterality: N/A;   LAPAROSCOPIC APPENDECTOMY  2007   Dr Curvin    LAPAROSCOPIC GASTRIC SLEEVE RESECTION N/A 10/08/2013   Procedure: LAPAROSCOPIC SLEEVE GASTRECTOMY;  Surgeon: Donnice KATHEE Lunger, MD;  Location: [facility];  Service: General;  Laterality: N/A;   LUMBAR LAMINECTOMY/DECOMPRESSION MICRODISCECTOMY N/A 09/16/2015   Procedure: MICRO LUMBAR DECOMPRESSION, MICRODISCECTOMY L5 - S1;  Surgeon: Reyes Billing, MD;  Location: [facility];  Service: Orthopedics;  Laterality: N/A;   POLYPECTOMY  05/05/2022   Procedure: POLYPECTOMY;  Surgeon: Albertus Gordy HERO, MD;  Location: [facility];  Service: Gastroenterology;;   ROTATOR CUFF REPAIR Right 2005   Dr Harden   Patient Active Problem List   Diagnosis Date Noted   Focal seizure (HCC) 01/22/2024   Allergic drug rash 12/29/2023   DVT, lower extremity, distal (HCC) 12/29/2023   Frontal glioblastoma (HCC) 12/26/2023   Brain mass 12/15/2023   Pseudofolliculitis barbae 05/01/2023   Iron deficiency 04/06/2023   Abdominal pain 03/15/2023   Microcytosis 03/14/2023   RUQ pain 03/12/2023   Abnormal EKG 03/12/2023   Chest pain 03/12/2023   Adult acne 01/19/2023   Obesity (BMI 30-39.9) 01/19/2023   Benign neoplasm of cecum    Benign neoplasm of ascending colon    Benign neoplasm of transverse colon    Difficult airway for intubation 01/18/2022   Upper respiratory disease 01/31/2019   Cerumen impaction 07/30/2018   HTN (hypertension) 11/03/2017   Spinal stenosis of lumbar region 09/16/2015  Insomnia 04/15/2015   Well adult exam 11/04/2014   H/O gastric sleeve 10/08/2013   Hypogonadism, male 07/17/2013   Erectile dysfunction 03/22/2013   Concentration deficit 04/27/2011   Attention or concentration deficit 04/27/2011   TINEA BARBAE 03/04/2009   LIVER HEMANGIOMA 03/04/2009   OVERWEIGHT-BMI 42 03/04/2009   OSA on CPAP 03/04/2009   DEGENERATIVE DISC DISEASE, LUMBAR SPINE 03/04/2009    ONSET DATE: 12-15-23  REFERRING DIAG: C71.9 (ICD-10-CM) - Malignant neoplasm of brain, unspecified  THERAPY DIAG:  Muscle weakness  (generalized)  Unsteadiness on feet  Other abnormalities of gait and mobility  Rationale for Evaluation and Treatment: Rehabilitation  SUBJECTIVE:                                                                                                                                                                                             SUBJECTIVE STATEMENT: Patient reports that he is doing well. Denies falls and near falls. Reports wanting to continue PT if possible to keep up strength.   Per chart note:  53 yo male presenting 12/20 to ED with decreased response time while driving, incoordination, imbalance. Imaging showed necrotic and hemorrhagic mass in the superior right frontal lobe with features of glioblastoma with mass effect. S/p R craniotomy 12/23. PMH includes: obesity and OSA.   Pt accompanied by:  wife  - [name]  PERTINENT HISTORY: history of OSA, HTN, Tinea barbae, obesity s/p gastric sleeve, liver hemangioma who was admitted on 12/15/23 with reports of decrease in coordination, minimal use of LUE/LLE and; hospital admission 12-15-23 with transfer to inpatient rehab 12-26-23 - 12-31-23  PAIN:  Are you having pain? No   PRECAUTIONS: Other: No driving; Fall  RED FLAGS: None   WEIGHT BEARING RESTRICTIONS: No  FALLS: Has patient fallen in last 6 months? No  LIVING ENVIRONMENT: Lives with: lives with their spouse Lives in: House/apartment Stairs: Yes: Internal: 12 steps; can reach both master bedroom on main level  - has not been up steps to 2nd level of home yet as only been home for 2 days Has following equipment at home: None  PLOF: Independent; pt retired from [employer] 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  DIAGNOSTIC FINDINGS: MRI brain done revealing necrotic, hemorrhagic mass in superior right frontal lobe  with features of glioblastoma and mass effect with very early right uncal herniation.  COGNITION: Overall cognitive status: Within functional limits for tasks assessed and appears to have slightly delayed processing - see OT eval for details   SENSATION: WFL  COORDINATION: WFL's bil. LE's  POSTURE: No Significant postural limitations  LOWER EXTREMITY ROM:   WNL's bil. LE's   LOWER EXTREMITY MMT:  RLE WNL's  MMT Right Eval Left Eval  Hip flexion 5 4-  Hip extension 5 3-  Hip abduction  4  Hip adduction    Hip internal rotation    Hip external rotation    Knee flexion 5 4-  Knee extension 5 5  Ankle dorsiflexion 5 5  Ankle plantarflexion 5 5  Ankle inversion    Ankle eversion    (Blank rows = not tested)  BED MOBILITY:  Independent  TRANSFERS: Assistive device utilized: None  Sit to stand: Modified independence Stand to sit: Modified independence  RAMP: TBA  CURB: TBA  STAIRS: TBA   GAIT: Gait pattern: step through pattern and decreased arm swing- Left Distance walked: 100' Assistive device utilized: None Level of assistance: SBA Comments: mildly decreased Lt arm swing   FUNCTIONAL TESTS:  5 times sit to stand: 11.69 secs from chair without UE support Timed up and go (TUG): 10.87 secs without device 10 meter walk test: 11.68 secs without device = 2.81 ft/sec Berg Balance Scale: 54/56   PATIENT SURVEYS:  N/A due to dx of brain tumor                                                                                                                            TREATMENT DATE:  01-29-24  Self Care: Vitals:   01/31/24 0937 01/31/24 1009  BP: (!) 144/94 (!) 138/98  Pulse: 86 86  Seated on RUE, elevated but WFL for therapy, advised close monitoring,  rechecked at end of session and BP elevated but still WFL for therapy and post exercises, recommended continuing to check at home and made OT aware of elevated readings on hand off  NMR:  Sensory Organization Test  Component  Passed  Falls  1 = Eyes Open 3/3 0  2 = Eyes Closed 3/3 0  3 = Eyes Open Background Moving 3/3 0  4 = Eye Open Floor Moving 3/3 0  5 = Eyes Closed Floor Moving 3/3 0  6 = Eyes Open Floor and Background Moving 3/3 0   Visual Above Age Matched Norms  Vestibular Above Age Matched Norms  Somatosensory Above Age Matched Norms  Composite (TOTAL SCORE: 29) Above Age Matched Norms   TherAct: Assessed goals as noted below and educated on progress since eval  STAIRS:  Level of Assistance: SBA  Stair Negotiation Technique: Alternating Pattern  with No Rails  Number of Stairs: 4   Height of Stairs: 6  Comments: appropriate and safe pacing  Jogging: Patient able to jog 1 x 115 feet without  difficulty and SBA   [facility] PT Assessment - 01/31/24 0001       Standardized Balance Assessment   Standardized Balance Assessment 10 meter walk test    10 Meter Walk 4.17   ft/sec without AD (modI)          Patient intentionally walking fast, recommend reassessing for normal pace next session before fully checking off goal    PATIENT EDUCATION: Education details: See above + progress on goals, BP safety Person educated: Patient and wife Education method: Explanation, Demonstration, Verbal cues, and Handouts Education comprehension: verbalized understanding and returned demonstration  HOME EXERCISE PROGRAM: Access Code: [email or web address] URL: [email or web address] Date: 01/04/2024 Prepared by: Rock Kussmaul  Exercises - 2# weight used for SLR, knee flexion and clam shell and hip abduction  - Supine Active Straight Leg Raise  - 1 x daily - 7 x weekly - 3 sets - 10 reps - SIDELYING LEG LIFTS; ALSO DO CIRCLES CLOCKWISE & COUNTERCLOCKWISE  - 1 x daily - 7 x weekly  - 3 sets - 10 reps - Prone Hip Extension  - 1 x daily - 7 x weekly - 3 sets - 10 reps - Clamshell  - 1 x daily - 7 x weekly - 3 sets - 10 reps - 5 sec hold - Prone Knee Flexion AROM  - 1 x daily - 7 x weekly - 3 sets - 10 reps - Single Leg Stance  - 1 x daily - 7 x weekly - 1 sets - 2-3 reps - 15 sec hold - Tandem Stance  - 1 x daily - 7 x weekly - 1 sets - 2 reps - 30 sec  hold - Romberg Stance Eyes Closed on Foam Pad  - 1 x daily - 5 x weekly - 3 sets - 30 hold - Narrow Stance with Eyes Closed and Head Rotation on Foam Pad  - 1 x daily - 5 x weekly - 2 sets - 10 reps - Single Leg Bridge  - 1 x daily - 5 x weekly - 2 sets - 10 reps  GOALS: Goals reviewed with patient? Yes  SHORT TERM GOALS: SAME AS LTG'S AS ELOS = 4 WEEKS  LONG TERM GOALS: Target date: 02-02-24  Assess FGA and set goal as appropriate. Baseline: score 30/30 on 01-29-24 Goal status: Goal met 01-29-24  2.  Improve Berg score to 56/56 to demo improved static standing balance. Baseline: 54/56 on 01-02-24 Goal status: INITIAL  3.  Pt will amb. 10 nonstop on various surfaces including grass, pavement and indoor, without LOB and with no c/o fatigue, demonstrating Lt arm swing, with supervision.  Baseline:  Goal status: Goal met 01-29-24  4.  Increase gait velocity to >/= 3.6 ft/sec without use of device for increased gait efficiency.  Baseline: 2.81 ft/sec (11.68 secs) Goal status: INITIAL  5.  Pt will jog 115' on flat, even surface independently without LOB and RPE </= 2/10.  Baseline: able to jog 115' independently, RPE to be assessed Goal status: IN PROGRESS  6.  Negotiate 4 steps using step over step sequence with supervision without use of hand rail to demo improved balance. Baseline: improved to 4 steps alt pattern with supervision no handrail Goal status: MET  7.  Independent in HEP for LLE strengthening, balance and coordination.   Baseline:  Goal status: INITIAL  8.  Pt will improve composite score to above  normal for age in order to demo improved balance.   Baseline: 64 (below normal), 89 (  above age matched norms)   Goal status: MET  ASSESSMENT:  CLINICAL IMPRESSION: Emphasis on skilled PT session on assessment on progress towards goals; patient is progressing excellently demonstrating scores above age matched norms on SOT. Given ongoing radiation and fluctuations anticipated in recent weeks, will consider continuing PT at 1x week for 5 weeks after next visit to address residual deficits as patient hoping to maintain and progress strength through radiation as able. BP elevated so recommend close monitoring and to go to ED if patient becomes symptomatic. Patient verbalizes understanding. Cont with POC.     OBJECTIVE IMPAIRMENTS: Abnormal gait, decreased activity tolerance, decreased balance, decreased coordination, and decreased strength.   ACTIVITY LIMITATIONS: carrying, lifting, squatting, stairs, and locomotion level  PARTICIPATION LIMITATIONS: cleaning, laundry, driving, shopping, community activity, and occupation  PERSONAL FACTORS: Profession and 1 comorbidity: s/p craniotomy due to brain tumor  are also affecting patient's functional outcome.   REHAB POTENTIAL: Good  CLINICAL DECISION MAKING: Evolving/moderate complexity  EVALUATION COMPLEXITY: Moderate  PLAN:  PT FREQUENCY: 3x/week  PT DURATION: 4 weeks  PLANNED INTERVENTIONS: 97110-Therapeutic exercises, 97530- Therapeutic activity, 97112- Neuromuscular re-education, [phone]- Self Care, [id number]- Gait training, [phone]- Aquatic Therapy, Patient/Family education, Balance training, and Stair training  PLAN FOR NEXT SESSION: finish checking LTGs - pt may want to continue PT during radiation schedule of 5 more weeks (decreased to 1x/week if continued)    work on LLE strengthening, - Lt hip abductor , flexor and hip extensor strengthening:   elliptical, high level balance and strengthening, balance with EC/compliant surfaces  Lauraine DELENA Grumbling, PT 01/31/2024, 10:43 AM

## 2024-01-31 NOTE — Therapy (Signed)
 OUTPATIENT OCCUPATIONAL THERAPY NEURO TREATMENT & PROGRESS NOTE  Patient Name: Nicholas Stevens MRN: [id number] DOB:1971/08/05, 53 y.o., male Today's Date: 01/31/2024  PCP: Garald Karlynn GAILS, MD REFERRING PROVIDER: Pegge Toribio PARAS, PA-C   END OF SESSION:  OT End of Session - 01/31/24 1019     Visit Number 7    Number of Visits 16    Date for OT Re-Evaluation 03/22/24    Authorization Type [employer]    OT Start Time 1020    OT Stop Time 1058    OT Time Calculation (min) 38 min    Equipment Utilized During Treatment computer, grooved pegboard    Activity Tolerance Patient tolerated treatment well    Behavior During Therapy WFL for tasks assessed/performed               Past Medical History:  Diagnosis Date   DDD (degenerative disc disease), lumbar    HNP (herniated nucleus pulposus)    Hypertension    Liver hemangioma    Morbid obesity (HCC)    OSA (obstructive sleep apnea)    no cpap used since weight loss   Overweight(278.02)    Plantar fasciitis of right foot    Sleep apnea    Tinea barbae    Past Surgical History:  Procedure Laterality Date   BREATH TEK H PYLORI N/A 08/20/2013   Procedure: BREATH TEK H PYLORI;  Surgeon: Donnice KATHEE Lunger, MD;  Location: [facility];  Service: General;  Laterality: N/A;   COLONOSCOPY WITH PROPOFOL  N/A 05/05/2022   Procedure: COLONOSCOPY WITH PROPOFOL ;  Surgeon: Albertus Gordy HERO, MD;  Location: [facility];  Service: Gastroenterology;  Laterality: N/A;   CRANIOTOMY Right 12/18/2023   Procedure: Right Frontal Stereotactic Craniotomy for Resection of Tumor;  Surgeon: Debby Dorn MATSU, MD;  Location: [facility];  Service: Neurosurgery;  Laterality: Right;   ESOPHAGOGASTRODUODENOSCOPY (EGD) WITH PROPOFOL  N/A 03/15/2023   Procedure: ESOPHAGOGASTRODUODENOSCOPY (EGD) WITH PROPOFOL ;  Surgeon: Avram Lupita BRAVO, MD;  Location: [facility];  Service: Gastroenterology;  Laterality: N/A;   LAPAROSCOPIC APPENDECTOMY  2007   Dr Curvin    LAPAROSCOPIC GASTRIC SLEEVE RESECTION N/A 10/08/2013   Procedure: LAPAROSCOPIC SLEEVE GASTRECTOMY;  Surgeon: Donnice KATHEE Lunger, MD;  Location: [facility];  Service: General;  Laterality: N/A;   LUMBAR LAMINECTOMY/DECOMPRESSION MICRODISCECTOMY N/A 09/16/2015   Procedure: MICRO LUMBAR DECOMPRESSION, MICRODISCECTOMY L5 - S1;  Surgeon: Reyes Billing, MD;  Location: [facility];  Service: Orthopedics;  Laterality: N/A;   POLYPECTOMY  05/05/2022   Procedure: POLYPECTOMY;  Surgeon: Albertus Gordy HERO, MD;  Location: [facility];  Service: Gastroenterology;;   ROTATOR CUFF REPAIR Right 2005   Dr Harden   Patient Active Problem List   Diagnosis Date Noted   Focal seizure (HCC) 01/22/2024   Allergic drug rash 12/29/2023   DVT, lower extremity, distal (HCC) 12/29/2023   Frontal glioblastoma (HCC) 12/26/2023   Brain mass 12/15/2023   Pseudofolliculitis barbae 05/01/2023   Iron deficiency 04/06/2023   Abdominal pain 03/15/2023   Microcytosis 03/14/2023   RUQ pain 03/12/2023   Abnormal EKG 03/12/2023   Chest pain 03/12/2023   Adult acne 01/19/2023   Obesity (BMI 30-39.9) 01/19/2023   Benign neoplasm of cecum    Benign neoplasm of ascending colon    Benign neoplasm of transverse colon    Difficult airway for intubation 01/18/2022   Upper respiratory disease 01/31/2019   Cerumen impaction 07/30/2018   HTN (hypertension) 11/03/2017   Spinal stenosis of lumbar region 09/16/2015   Insomnia 04/15/2015  Well adult exam 11/04/2014   H/O gastric sleeve 10/08/2013   Hypogonadism, male 07/17/2013   Erectile dysfunction 03/22/2013   Concentration deficit 04/27/2011   Attention or concentration deficit 04/27/2011   TINEA BARBAE 03/04/2009   LIVER HEMANGIOMA 03/04/2009   OVERWEIGHT-BMI 42 03/04/2009   OSA on CPAP 03/04/2009   DEGENERATIVE DISC DISEASE, LUMBAR SPINE 03/04/2009    ONSET DATE: 12/27/22 (referral date)  REFERRING DIAG: C71.9 (ICD-10-CM) - Malignant neoplasm of brain, unspecified   THERAPY DIAG:   Other lack of coordination  Muscle weakness (generalized)  Visuospatial deficit  Hemiplegia and hemiparesis following other nontraumatic intracranial hemorrhage affecting left dominant side (HCC)  Other symptoms and signs involving the nervous system  Rationale for Evaluation and Treatment: Rehabilitation  SUBJECTIVE:   SUBJECTIVE STATEMENT:  Pt's diastolic BP was in the 90s with PT today which is not his norm.  When he arrived to OT, he recalled that they ate out for the first time since the hospital and it was Mexican.  He also noted that he did not drink water like he normally does and that may account for the difference in his BP today.   Pt reported his L hand was a little tired after last seesion.    Pt accompanied by: self and significant other - [name]   PERTINENT HISTORY:  PMH includes: HTN, obesity (gastric sleeve), OSA, lumbar DDD.   53 yo male presenting 12/20 with decreased response time while driving, incoordination, imbalance. Pt was admitted to [facility] hospital from ED on 12/15/23;  Imaging showed necrotic and hemorrhagic mass in the superior right frontal lobe with features of glioblastoma with mass effect. underwent craniotomy for brain tumor (with features of gliobastoma per chart note) on 12/18/23;  pt was transferred to inpatient rehab on 12/26/23 and discharged home with wife on 12/31/23.     Per 12/27/23 acute OT evaluation: history of OSA, HTN, obesity s/p gastric sleeve, liver hemangioma who was admitted on 12/15/23 with reports of decrease in coordination, minimal use of LUE/LLE and fall prompting treatment. MRI brain done revealing necrotic, hemorrhagic mass in superior right frontal lobe with features of glioblastoma and mass effect with very early right uncal herniation.  He underwent right frontal stereotactic  craniotomy for resection of frontal lobe tumor on 12/23 by Dr. Debby.  Post op has had improvement in LUE strength and follow up MRI showed good debulking  of tumor with decrease in mass effect, collapse of lesion with region of enhancement  and 5 mm right to left shift. Pathology revealed high grade glioma grade 4 c/w glioblastoma.  PRECAUTIONS: Fall and Other: No driving  WEIGHT BEARING RESTRICTIONS: No  PAIN:  Are you having pain? No  FALLS: Has patient fallen in last 6 months? Yes. Number of falls 1   Pt reported he was working the day of the incident and noticed his reaction time was slow.  He just thought his cold medincine was making him slow, but he ended up falling into the nightstand and wall and couldn't get himself up due to L UE/LE weakness prompting ED visit and hospitalization.  LIVING ENVIRONMENT: Lives with: lives with their family, lives with their spouse (high school sweethearts), and lives with youngest 55 yo son - 1 of 3 children) Lives in: House Stairs: Yes: Internal: 14  steps; can reach both and External: 1 threshold  steps; none  Master bedroom on main level  - has not been up steps to 2nd level of home yet as only been  home for 2 days  Has following equipment: has built in seat in walk-in shower but not using it   PLOF: Independent - has a home gym, worked in patent examiner and had driven to/from [employer] on the day of the incident  PATIENT GOALS: To improve my strength and balance.  Pt stated he was in process of applying with [place] police dept on 12-15-23 when onset occurred - would like to be able to take this position if he is able to perform required work activities safely   OBJECTIVE:  Note: Objective measures were completed at Evaluation unless otherwise noted.  HAND DOMINANCE: Right  ADLs: Overall ADLs: Pt has resumed all ADLs on his own, even standing in shower, managing fasteners although they may take a little longer.  IADLs: Shopping: Goes out with wife but unable to drive Light housekeeping: Wife isn't currently letting him Meal Prep: Wife is still doing meal prep although he was  primary with this task previously Community mobility: Ind Medication management: Wife has it set up for him Financial management: Assisting wife, able to recall account numbers etc Handwriting: 100% legible - no change R UE unaffected  MOBILITY STATUS: Independent  POSTURE COMMENTS:  forward head Sitting balance: WNL  ACTIVITY TOLERANCE: Activity tolerance: limited  FUNCTIONAL OUTCOME MEASURES: Eval: Lawton-Brody IADL Scale: 3/8 01/31/24: IADL Scale: 7/8   UPPER EXTREMITY ROM:    Active ROM Right eval Left eval Left  01/31/24  Shoulder flexion WNL Slight end range limitations WNL  Shoulder abduction     Shoulder adduction     Shoulder extension     Shoulder internal rotation     Shoulder external rotation     Elbow flexion     Elbow extension     Wrist flexion     Wrist extension     Wrist ulnar deviation     Wrist radial deviation     Wrist pronation     Wrist supination     (Blank rows = not tested)  UPPER EXTREMITY MMT:     MMT Right eval Left eval Left 01/31/24  Shoulder flexion 5/5 4/5 4+  Shoulder abduction     Shoulder adduction     Shoulder extension     Shoulder internal rotation     Shoulder external rotation     Middle trapezius     Lower trapezius     Elbow flexion   5  Elbow extension   5  Wrist flexion     Wrist extension     Wrist ulnar deviation     Wrist radial deviation     Wrist pronation     Wrist supination     (Blank rows = not tested)  HAND FUNCTION: Grip strength: Right: 60.4 - 1st attempt not included, 82.2, 86.1, 106.0  lbs; Left: 51.3, 54.8, 52.6 lbs Average: Right: 91.4 lbs Left: 52.9 lbs   01/22/24 -  Right , 96, 88, 99 Left 45, 47, 66, 47, 64  Average Right 94.3 lbs Left: 53.8 lbs  COORDINATION: 9 Hole Peg test: Right: 19.15 sec; Left: 32.18 sec Box and Blocks:  Right 51 blocks, Left 37 blocks  01/22/24 - Left: 9 hole peg test - 23.05 sec Left: Box and Blocks test - 53 blocks  01/31/24 - Grooved peg test Right:  Inserted all pegs in 1:19.91- no drops; Total time with removing pegs 1:44.15 no drops Left: Inserted all pegs in 1:32.44 - 1 drop, Total time with removing pegs  1:58.19 removed 1 slip  SENSATION: WFL  EDEMA: NA  MUSCLE TONE: WFL  COGNITION: Overall cognitive status: Within functional limits for tasks assessed Recalled account number to bills independently VISION: Subjective report: Drops for glaucoma prevention/keep pressure down Baseline vision: Wears glasses for reading only Visual history:  NA  VISION ASSESSMENT: WFL  Patient has difficulty with following activities due to following visual impairments: NA  PERCEPTION: Not tested  PRAXIS: Not tested  OBSERVATIONS at eval: Pt ambulated with use of no AE and no loss of balance but with mildly decreased L arm swing and what OTR still noted as a slight favoring of R side with gaze. The pt is well kept and was accompanied by his high school sweetheart/wife who did admit to overdoing it when helping him at times when pointed out by OTR.                                                                                                                            TREATMENT DATE: 01/31/24  Therapeutic Activities  Pt engaged in Typing activity as his employment would require this.  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  Pt did have some varied use of LUE during typing tests with some involvement of L UE throughout ie) to press space bar but R hand would come over the L side of keyboard at times ie) to type tree but pt reports this was probably similar to prior level of performance - if letters were close together, he may have used his dominant hand for all the letters. Pt performed typing on desktop as well as laptop with the following results:  Desktop  1 minute test 14wpm 98% accuracy  3 minute test 19 wpm 98%  Laptop  3 minute test 18 wpm 97%   Pt provided typing resources for use at home - typing.com and  missedflights.com.br  Pt engaged in grooved peg test requiring increased accuracy with turning pegs to slide 25 grooved pegs into the slots with correct orientation. Right: Inserted all pegs in 1:19.91- no drops; Total time with removing pegs 1:44.15 no drops Left: inserted all pegs in 1:32.44 - 1 drop, Total time with removing pegs 1:58.19 removed 1 slip   PATIENT EDUCATION: Education details: Goal progression and UPOC  Person educated: Patient and Spouse Education method: Explanation, Demonstration, and Verbal cues Education comprehension: verbalized understanding, returned demonstration, and needs further education  HOME EXERCISE PROGRAM: 01/11/24 - FM coordination HEP 01/12/24 - BUE strengthening Theraband - Access Code: [email or web address] 01/22/24 - Putty Exercises/activities - Access Code: [email or web address]  GOALS: Goals reviewed with patient? Yes   Remaining/Additional GOALS: Target date: 03/01/24 2.  Patient will demonstrate at least 70+ lbs LUE grip strength as needed to open jars and other containers. Baseline: 52.9 lbs Goal status: IN Progress 01/22/24: Left: 53.8 lbs (greatest squeeze 66 lbs)  NEW GOAL: 8. 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  Baseline: 14-19 WPM with 98% accuracy   Goal Status: INITIAL  9. Patient will safely and comfortable perform simulated job related tasks > 30 minutes at a time without rests including but not limited to managing large gatherings including negotiating busy/tight spaces and managing unexpected interruptions - visually and tacitly.  Baseline: Out of work since brain surgery  Goal Status: INITIAL  GOALS MET  Patient will demonstrate initial LUE strength/coordination HEP with visual handouts only for proper execution to resume use of home gym including [employer] etc. Baseline: New to outpt OT Goal status: MET  3.  Patient will demo  improved FM coordination as evidenced by completing nine-hole peg with use of LUE in 25 seconds or less. Baseline: 32.18 Goal status: MET 01/22/24: Left 23.05 sec  4.  Pt will be able to place at least 42+ blocks using left hand with completion of Box and Blocks test. Baseline: 37 blocks Goal status: MET 01/22/24: 53 blocks  5.  Patient will demonstrate at least 95% accuracy with environmental visual scanning in distracting environment to assist with ADLs and IADLS including eventual driving considerations and work-related tasks as hydrographic surveyor.   Baseline: New to outpt OT Goal status: MET 01/24/24: Pt reported driving in neighborhood  6.  Pt will have adequate standing tolerance and balance to safely complete IADLS without supervision including preparing meals per PLOF. Baseline: Spouse is preparing meals. Brody-Lawton Scale 3/8 Goal status: MET 01/22/24: Spouse and wife reports he is making meals again. 01/31/24 Brody-Lawton Scale 7/8  7. Patient will demo improved BUE FM coordination as evidenced by independence with sorting medication on his own without dropping pills.  Baseline: Wife is assisting him Goal status: MET  ASSESSMENT:  CLINICAL IMPRESSION: Patient is a 53 y.o. male who was seen today for occupational therapy treatment for residual L UE deficits s/p glioblastoma and craniotomy. Patient able to engage BUE for typing without any obvious neglect of LUE.  Pt is continuing cancer treatments and would like to continue therapy to ensure maintenance of max strength, coordination and functional abilities.    This 7th progress note is for dates: 01/02/24 to 01/31/2024. Pt has met 6/7 goals and is making progress towards the last grip strength goal with additional goals added today as pt will continue to benefit from skilled OT services in the outpatient setting to work towards max rehab potential is met.       PERFORMANCE DEFICITS: in functional skills including IADLs,  coordination, dexterity, proprioception, ROM, strength, Fine motor control, Gross motor control, balance, endurance, vision, and UE functional use, cognitive skills including attention, and psychosocial skills including coping strategies and routines and behaviors.   IMPAIRMENTS: are limiting patient from IADLs, work, leisure, and social participation.   CO-MORBIDITIES: may have co-morbidities  that affects occupational performance. Patient will benefit from skilled OT to address above impairments and improve overall function.  REHAB POTENTIAL: Excellent   PLAN:  OT FREQUENCY: 1x/week  OT DURATION: up to additional 6 weeks but planning for 4 weeks  PLANNED INTERVENTIONS: 97535 self care/ADL training, 02889 therapeutic exercise, 97530 therapeutic activity, 97112 neuromuscular re-education, functional mobility training, coping strategies training, and patient/family education  RECOMMENDED OTHER SERVICES: PT also continuing 1x/week through radiation  CONSULTED AND AGREED WITH PLAN OF CARE: Patient and family member/caregiver  PLAN FOR NEXT SESSION:  Strengthening/gripHEP - progress HEP as tolerated Body blade - LUE especially Scanning  with distractions/Reaction times - [employer] Typing assessment and other work related tasks   Clarita LITTIE Pride, OT 01/31/2024, 12:04 PM

## 2024-02-01 ENCOUNTER — Ambulatory Visit
Admission: RE | Admit: 2024-02-01 | Discharge: 2024-02-01 | Disposition: A | Discharge status: Home / Self Care | Payer: Commercial Managed Care - PPO | Source: Ambulatory Visit | Attending: Radiation Oncology | Admitting: Radiation Oncology

## 2024-02-01 ENCOUNTER — Inpatient Hospital Stay: Payer: Commercial Managed Care - PPO | Attending: Neurosurgery | Admitting: Internal Medicine

## 2024-02-01 ENCOUNTER — Inpatient Hospital Stay: Payer: Commercial Managed Care - PPO

## 2024-02-01 ENCOUNTER — Other Ambulatory Visit: Payer: Self-pay

## 2024-02-01 VITALS — BP 132/89 | HR 66 | Temp 98.8°F | Resp 13 | Wt 272.9 lb

## 2024-02-01 DIAGNOSIS — C711 Malignant neoplasm of frontal lobe: Secondary | ICD-10-CM | POA: Diagnosis not present

## 2024-02-01 DIAGNOSIS — R569 Unspecified convulsions: Secondary | ICD-10-CM

## 2024-02-01 DIAGNOSIS — Z510 Encounter for antineoplastic radiation therapy: Secondary | ICD-10-CM | POA: Diagnosis not present

## 2024-02-01 DIAGNOSIS — G40909 Epilepsy, unspecified, not intractable, without status epilepticus: Secondary | ICD-10-CM | POA: Insufficient documentation

## 2024-02-01 DIAGNOSIS — Z79899 Other long term (current) drug therapy: Secondary | ICD-10-CM | POA: Insufficient documentation

## 2024-02-01 LAB — CBC WITH DIFFERENTIAL (CANCER CENTER ONLY)
Abs Immature Granulocytes: 0.09 10*3/uL — ABNORMAL HIGH (ref 0.00–0.07)
Basophils Absolute: 0 10*3/uL (ref 0.0–0.1)
Basophils Relative: 0 %
Eosinophils Absolute: 0.1 10*3/uL (ref 0.0–0.5)
Eosinophils Relative: 1 %
HCT: 53.7 % — ABNORMAL HIGH (ref 39.0–52.0)
Hemoglobin: 17.6 g/dL — ABNORMAL HIGH (ref 13.0–17.0)
Immature Granulocytes: 1 %
Lymphocytes Relative: 10 %
Lymphs Abs: 1.1 10*3/uL (ref 0.7–4.0)
MCH: 27.8 pg (ref 26.0–34.0)
MCHC: 32.8 g/dL (ref 30.0–36.0)
MCV: 84.8 fL (ref 80.0–100.0)
Monocytes Absolute: 0.7 10*3/uL (ref 0.1–1.0)
Monocytes Relative: 7 %
Neutro Abs: 8.8 10*3/uL — ABNORMAL HIGH (ref 1.7–7.7)
Neutrophils Relative %: 81 %
Platelet Count: 209 10*3/uL (ref 150–400)
RBC: 6.33 MIL/uL — ABNORMAL HIGH (ref 4.22–5.81)
RDW: 17.9 % — ABNORMAL HIGH (ref 11.5–15.5)
WBC Count: 10.7 10*3/uL — ABNORMAL HIGH (ref 4.0–10.5)
nRBC: 0 % (ref 0.0–0.2)

## 2024-02-01 LAB — RAD ONC ARIA SESSION SUMMARY
Course Elapsed Days: 10
Plan Fractions Treated to Date: 9
Plan Prescribed Dose Per Fraction: 2 Gy
Plan Total Fractions Prescribed: 23
Plan Total Prescribed Dose: 46 Gy
Reference Point Dosage Given to Date: 18 Gy
Reference Point Session Dosage Given: 2 Gy
Session Number: 9

## 2024-02-01 LAB — CMP (CANCER CENTER ONLY)
ALT: 27 U/L (ref 0–44)
AST: 13 U/L — ABNORMAL LOW (ref 15–41)
Albumin: 4 g/dL (ref 3.5–5.0)
Alkaline Phosphatase: 40 U/L (ref 38–126)
Anion gap: 3 — ABNORMAL LOW (ref 5–15)
BUN: 16 mg/dL (ref 6–20)
CO2: 32 mmol/L (ref 22–32)
Calcium: 9.4 mg/dL (ref 8.9–10.3)
Chloride: 104 mmol/L (ref 98–111)
Creatinine: 1.12 mg/dL (ref 0.61–1.24)
GFR, Estimated: >60 mL/min (ref >60)
Glucose, Bld: 92 mg/dL (ref 70–99)
Potassium: 4.2 mmol/L (ref 3.5–5.1)
Sodium: 139 mmol/L (ref 135–145)
Total Bilirubin: 0.6 mg/dL (ref 0.0–1.2)
Total Protein: 6.8 g/dL (ref 6.5–8.1)

## 2024-02-01 NOTE — Progress Notes (Signed)
 [facility] at [facility] [address]  [place], [place] [address] [phone]   Interval Evaluation  Date of Service: 02/01/24 Patient Name: Nicholas Stevens Patient MRN: [id number] Patient DOB: July 27, 1971 Provider: Arthea MARLA Manns, MD  Identifying Statement:  Nicholas Stevens is a 53 y.o. male with right frontal glioblastoma    Oncologic History: Oncology History  Frontal glioblastoma (HCC)  12/18/2023 Surgery   Craniotomy, resection of right frontal mass with Dr. Debby; path is glioblastoma, IDH pending   01/22/2024 -  Chemotherapy   Patient is on Treatment Plan : BRAIN GLIOBLASTOMA Radiation Therapy With Concurrent Temozolomide  75 mg/m2 Daily Followed By Sequential Maintenance Temozolomide  x 6-12 cycles       Biomarkers:  MGMT Unknown.  IDH 1/2 Unknown.  EGFR Unknown  TERT Unknown   Interval History: Nicholas Stevens presents for follow up after completing 2 weeks of radiation and Temodar .  No noted seizure events since 01/18/24.  He continues on Keppra , decadron .  Tolerating RT and chemo well overall.  Denies headaches.   H+P (01/08/24) Patient presented with several days of left sided clumsiness, impaired coordination.  Was found to have large right frontal mass, c/w primary brain tumor.  He underwent craniotomy, resection with Dr. Debby on 12/18/23; path demonstrated glioblastoma.  Following surgery, he had persistent right sided weakness, he is currently in outpatient PT following discharge from rehab.  No seizures, headaches.     Medications: Current Outpatient Medications on File Prior to Visit  Medication Sig Dispense Refill   apixaban  (ELIQUIS ) 5 MG TABS tablet Take 1 tablet (5 mg total) by mouth 2 (two) times daily. 60 tablet 0   camphor-menthol  (SARNA) lotion Apply topically as needed for itching. 222 mL 0   Cyanocobalamin  (B-12 PO) Take 1 tablet by mouth daily.     dexamethasone  (DECADRON ) 1 MG tablet Take 3 tablets (3  mg total) by mouth daily with breakfast for 7 days, THEN 2 tablets (2 mg total) daily with breakfast for 7 days, THEN 1 tablet (1 mg total) daily with breakfast for 7 days. 42 tablet 0   fexofenadine  (ALLEGRA ) 60 MG tablet Take 1 tablet (60 mg total) by mouth 2 (two) times daily. 60 tablet 0   latanoprost  (XALATAN ) 0.005 % ophthalmic solution Place 1 drop into both eyes at bedtime.     levETIRAcetam  (KEPPRA ) 500 MG tablet Take 1 tablet (500 mg total) by mouth 2 (two) times daily. 60 tablet 0   Multiple Vitamin (MULTIVITAMIN WITH MINERALS) TABS tablet Take 1 tablet by mouth daily.     Omega-3 Fatty Acids (FISH OIL) 1000 MG CAPS Take 1,000 mg by mouth daily.     ondansetron  (ZOFRAN ) 8 MG tablet Take 1 tablet (8 mg total) by mouth every 8 (eight) hours as needed for nausea or vomiting. May take 30-60 minutes prior to Temodar  administration if nausea/vomiting occurs as needed. 30 tablet 1   pantoprazole  (PROTONIX ) 40 MG tablet Take 1 tablet (40 mg total) by mouth daily. 90 tablet 3   pimecrolimus  (ELIDEL ) 1 % cream Apply 1 Application topically 2 (two) times daily as needed (Dermatitis).     senna-docusate (SENOKOT-S) 8.6-50 MG tablet Take 2 tablets by mouth daily at 6 (six) AM. 60 tablet 0   temozolomide  (TEMODAR ) 180 MG capsule Take 1 capsule (180 mg total) by mouth daily. May take on an empty stomach to decrease nausea & vomiting. 42 capsule 0   triamcinolone  cream (KENALOG ) 0.1 % Apply 1  Application topically daily. Use on affected areas for 2 weeks, then stop for 2 weeks. Repeat until clear if needed. 453 g 0   zolpidem  (AMBIEN ) 10 MG tablet TAKE 1 TABLET BY MOUTH EVERYDAY AT BEDTIME 90 tablet 1   No current facility-administered medications on file prior to visit.    Allergies: No Known Allergies Past Medical History:  Past Medical History:  Diagnosis Date   DDD (degenerative disc disease), lumbar    HNP (herniated nucleus pulposus)    Hypertension    Liver hemangioma    Morbid obesity  (HCC)    OSA (obstructive sleep apnea)    no cpap used since weight loss   Overweight(278.02)    Plantar fasciitis of right foot    Sleep apnea    Tinea barbae    Past Surgical History:  Past Surgical History:  Procedure Laterality Date   BREATH TEK H PYLORI N/A 08/20/2013   Procedure: BREATH TEK H PYLORI;  Surgeon: Donnice KATHEE Lunger, MD;  Location: [facility];  Service: General;  Laterality: N/A;   COLONOSCOPY WITH PROPOFOL  N/A 05/05/2022   Procedure: COLONOSCOPY WITH PROPOFOL ;  Surgeon: Albertus Gordy HERO, MD;  Location: [facility];  Service: Gastroenterology;  Laterality: N/A;   CRANIOTOMY Right 12/18/2023   Procedure: Right Frontal Stereotactic Craniotomy for Resection of Tumor;  Surgeon: Debby Dorn MATSU, MD;  Location: [facility];  Service: Neurosurgery;  Laterality: Right;   ESOPHAGOGASTRODUODENOSCOPY (EGD) WITH PROPOFOL  N/A 03/15/2023   Procedure: ESOPHAGOGASTRODUODENOSCOPY (EGD) WITH PROPOFOL ;  Surgeon: Avram Lupita BRAVO, MD;  Location: [facility];  Service: Gastroenterology;  Laterality: N/A;   LAPAROSCOPIC APPENDECTOMY  2007   Dr Curvin   LAPAROSCOPIC GASTRIC SLEEVE RESECTION N/A 10/08/2013   Procedure: LAPAROSCOPIC SLEEVE GASTRECTOMY;  Surgeon: Donnice KATHEE Lunger, MD;  Location: [facility];  Service: General;  Laterality: N/A;   LUMBAR LAMINECTOMY/DECOMPRESSION MICRODISCECTOMY N/A 09/16/2015   Procedure: MICRO LUMBAR DECOMPRESSION, MICRODISCECTOMY L5 - S1;  Surgeon: Reyes Billing, MD;  Location: [facility];  Service: Orthopedics;  Laterality: N/A;   POLYPECTOMY  05/05/2022   Procedure: POLYPECTOMY;  Surgeon: Albertus Gordy HERO, MD;  Location: [facility];  Service: Gastroenterology;;   ROTATOR CUFF REPAIR Right 2005   Dr Harden   Social History:  Social History   Socioeconomic History   Marital status: Married    Spouse name: Nicholas Stevens   Number of children: 3   Years of education: Not on file   Highest education level: 12th grade  Occupational History   Occupation: Doctor, General Practice PD    Employer:  UNEMPLOYED  Tobacco Use   Smoking status: Never    Passive exposure: Never   Smokeless tobacco: Never  Vaping Use   Vaping status: Never Used  Substance and Sexual Activity   Alcohol  use: No   Drug use: No   Sexual activity: Not on file  Other Topics Concern   Not on file  Social History Narrative   Married 18+ years   3 children - ages approx 4, 9 and 5...daughter age 38 w/ DM type 1   Social Drivers of Corporate Investment Banker Strain: Low Risk  (04/02/2023)   Overall Financial Resource Strain (CARDIA)    Difficulty of Paying Living Expenses: Not hard at all  Food Insecurity: No Food Insecurity (12/18/2023)   Hunger Vital Sign    Worried About Running Out of Food in the Last Year: Never true    Ran Out of Food in the Last Year: Never true  Transportation Needs: No Transportation  Needs (12/18/2023)   PRAPARE - Administrator, Civil Service (Medical): No    Lack of Transportation (Non-Medical): No  Physical Activity: Insufficiently Active (04/02/2023)   Exercise Vital Sign    Days of Exercise per Week: 4 days    Minutes of Exercise per Session: 30 min  Stress: No Stress Concern Present (04/02/2023)   [employer] of Occupational Health - Occupational Stress Questionnaire    Feeling of Stress : Not at all  Social Connections: Socially Integrated (04/02/2023)   Social Connection and Isolation Panel [NHANES]    Frequency of Communication with Friends and Family: More than three times a week    Frequency of Social Gatherings with Friends and Family: Once a week    Attends Religious Services: More than 4 times per year    Active Member of [employer] or Organizations: Yes    Attends Engineer, Structural: More than 4 times per year    Marital Status: Married  Catering Manager Violence: Not At Risk (12/18/2023)   Humiliation, Afraid, Rape, and Kick questionnaire    Fear of Current or Ex-Partner: No    Emotionally Abused: No    Physically Abused: No    Sexually  Abused: No   Family History:  Family History  Problem Relation Age of Onset   Other Mother        hx of DJD   Liver disease Father        ETOH, Hep C   Hypertension Father    Colon cancer Neg Hx    Colon polyps Neg Hx    Esophageal cancer Neg Hx    Rectal cancer Neg Hx    Stomach cancer Neg Hx     Review of Systems: Constitutional: Doesn't report fevers, chills or abnormal weight loss Eyes: Doesn't report blurriness of vision Ears, nose, mouth, throat, and face: Doesn't report sore throat Respiratory: Doesn't report cough, dyspnea or wheezes Cardiovascular: Doesn't report palpitation, chest discomfort  Gastrointestinal:  Doesn't report nausea, constipation, diarrhea GU: Doesn't report incontinence Skin: Doesn't report skin rashes Neurological: Per HPI Musculoskeletal: Doesn't report joint pain Behavioral/Psych: Doesn't report anxiety  Physical Exam: Vitals:   02/01/24 1450  BP: 132/89  Pulse: 66  Resp: 13  Temp: 98.8 F (37.1 C)  SpO2: 100%   KPS: 80. General: Alert, cooperative, pleasant, in no acute distress Head: Normal EENT: No conjunctival injection or scleral icterus.  Lungs: Resp effort normal Cardiac: Regular rate Abdomen: Non-distended abdomen Skin: No rashes cyanosis or petechiae. Extremities: No clubbing or edema  Neurologic Exam: Mental Status: Awake, alert, attentive to examiner. Oriented to self and environment. Language is fluent with intact comprehension.  Cranial Nerves: Visual acuity is grossly normal. Visual fields are full. Extra-ocular movements intact. No ptosis. Face is symmetric Motor: Tone and bulk are normal. Power is 4+/5 in left arm and leg. Reflexes are symmetric, no pathologic reflexes present.  Sensory: Intact to light touch Gait: Normal.   Labs: I have reviewed the data as listed    Component Value Date/Time   NA 139 02/01/2024 1353   K 4.2 02/01/2024 1353   CL 104 02/01/2024 1353   CO2 32 02/01/2024 1353   GLUCOSE 92  02/01/2024 1353   BUN 16 02/01/2024 1353   CREATININE 1.12 02/01/2024 1353   CALCIUM 9.4 02/01/2024 1353   PROT 6.8 02/01/2024 1353   ALBUMIN 4.0 02/01/2024 1353   AST 13 (L) 02/01/2024 1353   ALT 27 02/01/2024 1353   ALKPHOS 40  02/01/2024 1353   BILITOT 0.6 02/01/2024 1353   GFRNONAA >60 02/01/2024 1353   GFRAA >60 09/15/2015 1520   Lab Results  Component Value Date   WBC 10.7 (H) 02/01/2024   NEUTROABS 8.8 (H) 02/01/2024   HGB 17.6 (H) 02/01/2024   HCT 53.7 (H) 02/01/2024   MCV 84.8 02/01/2024   PLT 209 02/01/2024    Imaging:  CT Head Wo Contrast Result Date: 01/19/2024 CLINICAL DATA:  Initial evaluation for acute seizure. History of glioblastoma. EXAM: CT HEAD WITHOUT CONTRAST TECHNIQUE: Contiguous axial images were obtained from the base of the skull through the vertex without intravenous contrast. RADIATION DOSE REDUCTION: This exam was performed according to the departmental dose-optimization program which includes automated exposure control, adjustment of the mA and/or kV according to patient size and/or use of iterative reconstruction technique. COMPARISON:  Prior MRI from 01/12/2024. FINDINGS: Brain: Postoperative changes from prior right frontal craniotomy. Patient's known glioma involving the right frontal lobe again seen, measuring approximately 4.0 x 3.4 x 2.1 cm. This measures increased in size from recent MRI, although exact comparison is somewhat difficult given different modalities. Surrounding hypodensity and vasogenic edema appears similar to perhaps slightly worsened. Associated mass effect with 2-3 mm right-to-left shift, worsened from recent MRI. Scattered hyperdensity within the lesion consistent with blood products and/or necrosis, also seen on prior. No other acute intracranial hemorrhage or large vessel territory infarct. No other mass lesion. No hydrocephalus or trapping. No extra-axial fluid collection. Vascular: No asymmetric hyperdense vessel. Skull: Scalp soft  tissues demonstrate no acute finding. Prior right frontal craniotomy. Sinuses/Orbits: Globes orbital soft tissues within normal limits. Paranasal sinuses are largely clear. No mastoid effusion. Other: None. IMPRESSION: 1. Patient's known glioma involving the right frontal lobe measures 4.0 x 3.4 x 2.1 cm, increased in size from recent MRI, although exact comparison is somewhat difficult given different modalities. Surrounding hypodensity and vasogenic edema appears similar to perhaps slightly worsened. Associated mass effect with 2-3 mm right-to-left shift, worsened from recent MRI. 2. No other acute intracranial abnormality. Electronically Signed   By: Morene Hoard M.D.   On: 01/19/2024 02:07   MR Brain W Wo Contrast Result Date: 01/12/2024 CLINICAL DATA:  Staging of glioblastoma EXAM: MRI HEAD WITHOUT AND WITH CONTRAST TECHNIQUE: Multiplanar, multiecho pulse sequences of the brain and surrounding structures were obtained without and with intravenous contrast. CONTRAST:  8mL GADAVIST  GADOBUTROL  1 MMOL/ML IV SOLN COMPARISON:  12/19/2023 FINDINGS: Brain: No acute infarct. Chronic blood products at the site of the right frontal lobe mass. Compared to 12/19/2023, there is increased peripheral nodular contrast enhancement at the right frontal operative site. The central cavity has contracted. The mass now measures 3.7 x 3.0 cm, previously 3.2 x 2.8 cm (sagittal image 13). The degree of surrounding hyperintense T2-weighted signal is approximately unchanged. The previously demonstrated midline shift is decreased/resolved. No new areas of contrast enhancement. Vascular: Normal flow voids. Skull and upper cervical spine: Remote right frontal craniotomy. Sinuses/Orbits:No paranasal sinus fluid levels or advanced mucosal thickening. No mastoid or middle ear effusion. Normal orbits. IMPRESSION: 1. Increased peripheral nodular contrast enhancement at the right frontal operative site with contraction of the central  cavity. The mass now measures 3.7 x 3.0 cm, previously 3.2 x 2.8 cm. Findings are most consistent with progressive disease. 2. Decreased/resolved midline shift. Electronically Signed   By: Franky Stanford M.D.   On: 01/12/2024 22:51     Assessment/Plan Frontal glioblastoma (HCC)  Focal seizure (HCC)  Nicholas Stevens is clinically stable  today, now having completed 2 weeks of IMRT and concurrent Temodar .    We ultimately recommended continuing with course of intensity modulated radiation therapy and concurrent daily Temozolomide .  Radiation will be administered Mon-Fri over 6 weeks, Temodar  will be dosed at 75mg /m2 to be given daily over 42 days.  We reviewed side effects of temodar , including fatigue, nausea/vomiting, constipation, and cytopenias.  Informed consent was verbally obtained at bedside to proceed with oral chemotherapy.  Chemotherapy should be held for the following:  ANC less than 1,000  Platelets less than 100,000  LFT or creatinine greater than 2x ULN  If clinical concerns/contraindications develop  Every 2 weeks during radiation, labs will be checked accompanied by a clinical evaluation in the brain tumor clinic.  Recommended continuing Keppra  500mg  BID for seizure prevention.  Recommended continuing to decrease decadron  by 1mg  each week.  Dose may be modified if focal symptoms recur.  All questions were answered. The patient knows to call the clinic with any problems, questions or concerns. No barriers to learning were detected.  The total time spent in the encounter was 30 minutes and more than 50% was on counseling and review of test results   Arthea MARLA Manns, MD Medical Director of Neuro-Oncology [facility] 02/01/24 2:57 PM

## 2024-02-02 ENCOUNTER — Encounter: Payer: Self-pay | Admitting: Physical Therapy

## 2024-02-02 ENCOUNTER — Ambulatory Visit: Payer: Commercial Managed Care - PPO | Admitting: Physical Therapy

## 2024-02-02 ENCOUNTER — Ambulatory Visit
Admission: RE | Admit: 2024-02-02 | Discharge: 2024-02-02 | Disposition: A | Discharge status: Home / Self Care | Payer: Commercial Managed Care - PPO | Source: Ambulatory Visit | Attending: Radiation Oncology | Admitting: Radiation Oncology

## 2024-02-02 ENCOUNTER — Other Ambulatory Visit: Payer: Self-pay

## 2024-02-02 VITALS — BP 133/85 | HR 77

## 2024-02-02 DIAGNOSIS — R2681 Unsteadiness on feet: Secondary | ICD-10-CM

## 2024-02-02 DIAGNOSIS — Z510 Encounter for antineoplastic radiation therapy: Secondary | ICD-10-CM | POA: Diagnosis not present

## 2024-02-02 DIAGNOSIS — M6281 Muscle weakness (generalized): Secondary | ICD-10-CM

## 2024-02-02 DIAGNOSIS — R2689 Other abnormalities of gait and mobility: Secondary | ICD-10-CM

## 2024-02-02 LAB — RAD ONC ARIA SESSION SUMMARY
Course Elapsed Days: 11
Plan Fractions Treated to Date: 10
Plan Prescribed Dose Per Fraction: 2 Gy
Plan Total Fractions Prescribed: 23
Plan Total Prescribed Dose: 46 Gy
Reference Point Dosage Given to Date: 20 Gy
Reference Point Session Dosage Given: 2 Gy
Session Number: 10

## 2024-02-02 NOTE — Therapy (Signed)
 OUTPATIENT PHYSICAL THERAPY NEURO TREATMENT NOTE / RE-CERT   Patient Name: Nicholas Stevens MRN: [id number] DOB:01/11/1971, 53 y.o., male Today's Date: 02/02/2024   PCP: Loman Karlynn GAILS., MD REFERRING PROVIDER: Pegge Toribio PARAS, PA-C  END OF SESSION:  PT End of Session - 02/02/24 1020     Visit Number 12    Number of Visits 18    Date for PT Re-Evaluation 03/22/24    Authorization Type [employer]    PT Start Time 1017    PT Stop Time 1100    PT Time Calculation (min) 43 min    Equipment Utilized During Treatment Gait belt    Activity Tolerance Patient tolerated treatment well    Behavior During Therapy WFL for tasks assessed/performed            Past Medical History:  Diagnosis Date   DDD (degenerative disc disease), lumbar    HNP (herniated nucleus pulposus)    Hypertension    Liver hemangioma    Morbid obesity (HCC)    OSA (obstructive sleep apnea)    no cpap used since weight loss   Overweight(278.02)    Plantar fasciitis of right foot    Sleep apnea    Tinea barbae    Past Surgical History:  Procedure Laterality Date   BREATH TEK H PYLORI N/A 08/20/2013   Procedure: BREATH TEK VEAR [name];  Surgeon: Donnice KATHEE Lunger, MD;  Location: [facility];  Service: General;  Laterality: N/A;   COLONOSCOPY WITH PROPOFOL  N/A 05/05/2022   Procedure: COLONOSCOPY WITH PROPOFOL ;  Surgeon: Albertus Gordy HERO, MD;  Location: [facility];  Service: Gastroenterology;  Laterality: N/A;   CRANIOTOMY Right 12/18/2023   Procedure: Right Frontal Stereotactic Craniotomy for Resection of Tumor;  Surgeon: Debby Dorn MATSU, MD;  Location: [facility];  Service: Neurosurgery;  Laterality: Right;   ESOPHAGOGASTRODUODENOSCOPY (EGD) WITH PROPOFOL  N/A 03/15/2023   Procedure: ESOPHAGOGASTRODUODENOSCOPY (EGD) WITH PROPOFOL ;  Surgeon: Avram Lupita BRAVO, MD;  Location: [facility];  Service: Gastroenterology;  Laterality: N/A;   LAPAROSCOPIC APPENDECTOMY  2007   Dr Curvin   LAPAROSCOPIC GASTRIC SLEEVE  RESECTION N/A 10/08/2013   Procedure: LAPAROSCOPIC SLEEVE GASTRECTOMY;  Surgeon: Donnice KATHEE Lunger, MD;  Location: [facility];  Service: General;  Laterality: N/A;   LUMBAR LAMINECTOMY/DECOMPRESSION MICRODISCECTOMY N/A 09/16/2015   Procedure: MICRO LUMBAR DECOMPRESSION, MICRODISCECTOMY L5 - S1;  Surgeon: Reyes Billing, MD;  Location: [facility];  Service: Orthopedics;  Laterality: N/A;   POLYPECTOMY  05/05/2022   Procedure: POLYPECTOMY;  Surgeon: Albertus Gordy HERO, MD;  Location: [facility];  Service: Gastroenterology;;   ROTATOR CUFF REPAIR Right 2005   Dr Harden   Patient Active Problem List   Diagnosis Date Noted   Focal seizure (HCC) 01/22/2024   Allergic drug rash 12/29/2023   DVT, lower extremity, distal (HCC) 12/29/2023   Frontal glioblastoma (HCC) 12/26/2023   Brain mass 12/15/2023   Pseudofolliculitis barbae 05/01/2023   Iron deficiency 04/06/2023   Abdominal pain 03/15/2023   Microcytosis 03/14/2023   RUQ pain 03/12/2023   Abnormal EKG 03/12/2023   Chest pain 03/12/2023   Adult acne 01/19/2023   Obesity (BMI 30-39.9) 01/19/2023   Benign neoplasm of cecum    Benign neoplasm of ascending colon    Benign neoplasm of transverse colon    Difficult airway for intubation 01/18/2022   Upper respiratory disease 01/31/2019   Cerumen impaction 07/30/2018   HTN (hypertension) 11/03/2017   Spinal stenosis of lumbar region 09/16/2015   Insomnia 04/15/2015   Well adult exam  11/04/2014   H/O gastric sleeve 10/08/2013   Hypogonadism, male 07/17/2013   Erectile dysfunction 03/22/2013   Concentration deficit 04/27/2011   Attention or concentration deficit 04/27/2011   TINEA BARBAE 03/04/2009   LIVER HEMANGIOMA 03/04/2009   OVERWEIGHT-BMI 42 03/04/2009   OSA on CPAP 03/04/2009   DEGENERATIVE DISC DISEASE, LUMBAR SPINE 03/04/2009    ONSET DATE: 12-15-23  REFERRING DIAG: C71.9 (ICD-10-CM) - Malignant neoplasm of brain, unspecified  THERAPY DIAG:  Muscle weakness (generalized) - Plan: PT plan  of care cert/re-cert  Unsteadiness on feet - Plan: PT plan of care cert/re-cert  Other abnormalities of gait and mobility - Plan: PT plan of care cert/re-cert  Rationale for Evaluation and Treatment: Rehabilitation  SUBJECTIVE:                                                                                                                                                                                             SUBJECTIVE STATEMENT: Patient reports doing well. Reports wanting to continue PT radiation to keep up strength. Denies falls and near falls.   Per chart note:  53 yo male presenting 12/20 to ED with decreased response time while driving, incoordination, imbalance. Imaging showed necrotic and hemorrhagic mass in the superior right frontal lobe with features of glioblastoma with mass effect. S/p R craniotomy 12/23. PMH includes: obesity and OSA.   Pt accompanied by:  wife  - [name]  PERTINENT HISTORY: history of OSA, HTN, Tinea barbae, obesity s/p gastric sleeve, liver hemangioma who was admitted on 12/15/23 with reports of decrease in coordination, minimal use of LUE/LLE and; hospital admission 12-15-23 with transfer to inpatient rehab 12-26-23 - 12-31-23  PAIN:  Are you having pain? No  PRECAUTIONS: Other: No driving; Fall  RED FLAGS: None   WEIGHT BEARING RESTRICTIONS: No  FALLS: Has patient fallen in last 6 months? No  LIVING ENVIRONMENT: Lives with: lives with their spouse Lives in: House/apartment Stairs: Yes: Internal: 12 steps; can reach both master bedroom on main level  - has not been up steps to 2nd level of home yet as only been home for 2 days Has following equipment at home: None  PLOF: Independent; pt retired from [employer] 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  DIAGNOSTIC FINDINGS: MRI brain done  revealing necrotic, hemorrhagic mass in superior right frontal lobe with features of glioblastoma and mass effect with very early right uncal herniation.  COGNITION: Overall cognitive status: Within functional limits for tasks assessed and appears to have slightly delayed processing - see OT eval for details   SENSATION: WFL  COORDINATION: WFL's bil. LE's  POSTURE: No Significant postural limitations  LOWER EXTREMITY ROM:   WNL's bil. LE's   LOWER EXTREMITY MMT:  RLE WNL's  MMT Right Eval Left Eval  Hip flexion 5 4-  Hip extension 5 3-  Hip abduction  4  Hip adduction    Hip internal rotation    Hip external rotation    Knee flexion 5 4-  Knee extension 5 5  Ankle dorsiflexion 5 5  Ankle plantarflexion 5 5  Ankle inversion    Ankle eversion    (Blank rows = not tested)  BED MOBILITY:  Independent  TRANSFERS: Assistive device utilized: None  Sit to stand: Modified independence Stand to sit: Modified independence  RAMP: TBA  CURB: TBA  STAIRS: TBA   GAIT: Gait pattern: step through pattern and decreased arm swing- Left Distance walked: 100' Assistive device utilized: None Level of assistance: SBA Comments: mildly decreased Lt arm swing   FUNCTIONAL TESTS:  5 times sit to stand: 11.69 secs from chair without UE support Timed up and go (TUG): 10.87 secs without device 10 meter walk test: 11.68 secs without device = 2.81 ft/sec Berg Balance Scale: 54/56   PATIENT SURVEYS:  N/A due to dx of brain tumor                                                                                                                            TREATMENT DATE:  02-02-24  Vitals:   02/02/24 1022  BP: 133/85  Pulse: 77  Assessed seated on RUE  Gait: Overground gait in complex  environment outdoors, up and down low grade ramps, through grass, without loss of balance and unsteadiness (mod I)  Jogging lap 1 x 115' with RPE: 2/10 (SBA)  TherAct:  [facility] PT Assessment - 02/02/24 0001       Standardized Balance Assessment   Standardized Balance Assessment 10 meter walk test;Berg Balance Test    10 Meter Walk 3.76   ft/sec modI     Berg Balance Test   Sit to Stand Able to stand without using hands and stabilize independently    Standing Unsupported Able to stand safely 2 minutes    Sitting with Back Unsupported but Feet Supported on Floor or Stool Able to sit safely and securely 2 minutes    Stand to Sit Sits safely with minimal use of hands    Transfers Able to transfer safely, minor use of hands    Standing Unsupported with Eyes Closed Able to stand 10 seconds safely    Standing Unsupported with Feet Together Able to place feet together independently and stand 1 minute safely  From Standing, Reach Forward with Outstretched Arm Can reach confidently >25 cm (10)    From Standing Position, Pick up Object from Floor Able to pick up shoe safely and easily    From Standing Position, Turn to Look Behind Over each Shoulder Looks behind from both sides and weight shifts well    Turn 360 Degrees Able to turn 360 degrees safely in 4 seconds or less    Standing Unsupported, Alternately Place Feet on Step/Stool Able to stand independently and safely and complete 8 steps in 20 seconds    Standing Unsupported, One Foot in Front Able to place foot tandem independently and hold 30 seconds    Standing on One Leg Able to lift leg independently and hold equal to or more than 3 seconds   3 seconds R leg, 8 seconds L leg   Total Score 54    Berg comment: 54/56            Sinlge leg stance: 3 seconds on R, 8 seconds on the L captured on Berg  TherEx: Staggered sit to stand with 15lb kettle bell chest hold 2 x 10 reps Staggered sit to stand tap with 15lb kettle bell chest hold 2  x 5 reps    PATIENT EDUCATION: Education details: See above + progress on goals, + additions to HEP Person educated: Patient and wife Education method: Explanation, Demonstration, Verbal cues, and Handouts Education comprehension: verbalized understanding and returned demonstration  HOME EXERCISE PROGRAM: Access Code: [email or web address] URL: [email or web address] Date: 02/02/2024 Prepared by: Lauraine Grumbling  Exercises - Supine Active Straight Leg Raise  - 1 x daily - 7 x weekly - 3 sets - 10 reps - SIDELYING LEG LIFTS; ALSO DO CIRCLES CLOCKWISE & COUNTERCLOCKWISE  - 1 x daily - 7 x weekly - 3 sets - 10 reps - Prone Hip Extension  - 1 x daily - 7 x weekly - 3 sets - 10 reps - Clamshell  - 1 x daily - 7 x weekly - 3 sets - 10 reps - 5 sec hold - Prone Knee Flexion AROM  - 1 x daily - 7 x weekly - 3 sets - 10 reps - Single Leg Stance  - 1 x daily - 7 x weekly - 1 sets - 2-3 reps - 15 sec hold - Tandem Stance  - 1 x daily - 7 x weekly - 1 sets - 2 reps - 30 sec  hold - Romberg Stance Eyes Closed on Foam Pad  - 1 x daily - 5 x weekly - 3 sets - 30 hold - Narrow Stance with Eyes Closed and Head Rotation on Foam Pad  - 1 x daily - 5 x weekly - 2 sets - 10 reps - Single Leg Bridge  - 1 x daily - 5 x weekly - 2 sets - 10 reps - Staggered Sit-to-Stand  - 1 x daily - 7 x weekly - 4 sets - 5-10 reps - (first two sets with full sit to 10 reps, second two sets with 5 reps with tap) hold  GOALS: Goals reviewed with patient? Yes  SHORT TERM GOALS: SAME AS LTG'S AS ELOS = 4 WEEKS  LONG TERM GOALS: Target date: 02-02-24  Assess FGA and set goal as appropriate. Baseline: score 30/30 on 01-29-24 Goal status: Goal met 01-29-24  2.  Improve Berg score to 56/56 to demo improved static standing balance. Baseline: 54/56 on 1-7-254, 54/56  Goal status: NOT MET  3.  Pt will amb. 10  nonstop on various surfaces including grass, pavement and indoor, without LOB and with no c/o fatigue, demonstrating Lt  arm swing, with supervision.  Baseline:  Goal status: Goal met 01-29-24  4.  Increase gait velocity to >/= 3.6 ft/sec without use of device for increased gait efficiency.  Baseline: 2.81 ft/sec (11.68 secs); improved to 3.76 ft/sec Goal status: mt  5.  Pt will jog 115' on flat, even surface independently without LOB and RPE </= 2/10.  Baseline: able to jog 115' independently, RPE to be assessed Goal status: IN PROGRESS, MET reports RPE 2/10  6.  Negotiate 4 steps using step over step sequence with supervision without use of hand rail to demo improved balance. Baseline: improved to 4 steps alt pattern with supervision no handrail Goal status: MET  7.  Independent in HEP for LLE strengthening, balance and coordination.   Baseline: reports goo understanding so far  Goal status: IN PROGRESS   8.  Pt will improve composite score to above normal for age in order to demo improved balance.   Baseline: 64 (below normal), 89 (above age matched norms)   Goal status: MET  LONG TERM GOALS: Target date: 03/22/2024  Patient will improve single leg stance to 10 seconds bilaterally to demonstrate progress towards decreased risk for falls.  Baseline:  seconds on R, 8 seconds on the L  Goal status: Goal met 2-3-2  2.  Independent in HEP for LLE strengthening, balance and coordination.   Baseline: reports good understanding thus far, requires progression  Goal status: IN PROGRESS    ASSESSMENT:  CLINICAL IMPRESSION: Emphasis on skilled PT session on re-certing POC to continue to maintain and progress through final rounds of radiation. Patient still at mild falls risk as indicated by SLS < 10 seconds. Patient overall made excellently towards LTGs and will benefit from progression of HEP accordingly as well as final work on single leg balance. Cont with POC.     OBJECTIVE IMPAIRMENTS: Abnormal gait, decreased activity tolerance, decreased balance, decreased coordination, and decreased strength.    ACTIVITY LIMITATIONS: carrying, lifting, squatting, stairs, and locomotion level  PARTICIPATION LIMITATIONS: cleaning, laundry, driving, shopping, community activity, and occupation  PERSONAL FACTORS: Profession and 1 comorbidity: s/p craniotomy due to brain tumor  are also affecting patient's functional outcome.   REHAB POTENTIAL: Good  CLINICAL DECISION MAKING: Evolving/moderate complexity  EVALUATION COMPLEXITY: Moderate  PLAN:  PT FREQUENCY: 3x/week + 1x/week  PT DURATION: 4 weeks + 5 weeks (to get through radiation treatment)   PLANNED INTERVENTIONS: 97110-Therapeutic exercises, 97530- Therapeutic activity, 97112- Neuromuscular re-education, [phone]- Self Care, [id number]- Gait training, [phone]- Aquatic Therapy, Patient/Family education, Balance training, and Stair training  PLAN FOR NEXT SESSION: SLS work progression, glute med strength, endurance with high level tasks  work on LLE strengthening, - Lt hip abductor , flexor and hip extensor strengthening:   elliptical, high level balance and strengthening, balance with EC/compliant surfaces  Lauraine DELENA Grumbling, PT, DPT 02/02/2024, 12:22 PM

## 2024-02-05 ENCOUNTER — Other Ambulatory Visit: Payer: Self-pay

## 2024-02-05 ENCOUNTER — Ambulatory Visit
Admission: RE | Admit: 2024-02-05 | Discharge: 2024-02-05 | Disposition: A | Discharge status: Home / Self Care | Payer: Commercial Managed Care - PPO | Source: Ambulatory Visit | Attending: Radiation Oncology

## 2024-02-05 DIAGNOSIS — Z510 Encounter for antineoplastic radiation therapy: Secondary | ICD-10-CM | POA: Diagnosis not present

## 2024-02-05 LAB — RAD ONC ARIA SESSION SUMMARY
Course Elapsed Days: 14
Plan Fractions Treated to Date: 11
Plan Prescribed Dose Per Fraction: 2 Gy
Plan Total Fractions Prescribed: 23
Plan Total Prescribed Dose: 46 Gy
Reference Point Dosage Given to Date: 22 Gy
Reference Point Session Dosage Given: 2 Gy
Session Number: 11

## 2024-02-06 ENCOUNTER — Ambulatory Visit
Admission: RE | Admit: 2024-02-06 | Discharge: 2024-02-06 | Disposition: A | Discharge status: Home / Self Care | Payer: Commercial Managed Care - PPO | Source: Ambulatory Visit | Attending: Radiation Oncology | Admitting: Radiation Oncology

## 2024-02-06 ENCOUNTER — Other Ambulatory Visit: Payer: Self-pay

## 2024-02-06 DIAGNOSIS — Z510 Encounter for antineoplastic radiation therapy: Secondary | ICD-10-CM | POA: Diagnosis not present

## 2024-02-06 LAB — RAD ONC ARIA SESSION SUMMARY
Course Elapsed Days: 15
Plan Fractions Treated to Date: 12
Plan Prescribed Dose Per Fraction: 2 Gy
Plan Total Fractions Prescribed: 23
Plan Total Prescribed Dose: 46 Gy
Reference Point Dosage Given to Date: 24 Gy
Reference Point Session Dosage Given: 2 Gy
Session Number: 12

## 2024-02-07 ENCOUNTER — Other Ambulatory Visit: Payer: Self-pay

## 2024-02-07 ENCOUNTER — Ambulatory Visit
Admission: RE | Admit: 2024-02-07 | Discharge: 2024-02-07 | Discharge status: Home / Self Care | Payer: Commercial Managed Care - PPO | Source: Ambulatory Visit | Attending: Radiation Oncology

## 2024-02-07 DIAGNOSIS — Z510 Encounter for antineoplastic radiation therapy: Secondary | ICD-10-CM | POA: Diagnosis not present

## 2024-02-07 LAB — RAD ONC ARIA SESSION SUMMARY
Course Elapsed Days: 16
Plan Fractions Treated to Date: 13
Plan Prescribed Dose Per Fraction: 2 Gy
Plan Total Fractions Prescribed: 23
Plan Total Prescribed Dose: 46 Gy
Reference Point Dosage Given to Date: 26 Gy
Reference Point Session Dosage Given: 2 Gy
Session Number: 13

## 2024-02-08 ENCOUNTER — Other Ambulatory Visit: Payer: Self-pay

## 2024-02-08 ENCOUNTER — Ambulatory Visit: Payer: Commercial Managed Care - PPO | Admitting: Physical Therapy

## 2024-02-08 ENCOUNTER — Ambulatory Visit
Admission: RE | Admit: 2024-02-08 | Discharge: 2024-02-08 | Disposition: A | Discharge status: Home / Self Care | Payer: Commercial Managed Care - PPO | Source: Ambulatory Visit | Attending: Radiation Oncology

## 2024-02-08 ENCOUNTER — Ambulatory Visit: Payer: Commercial Managed Care - PPO | Admitting: Occupational Therapy

## 2024-02-08 ENCOUNTER — Inpatient Hospital Stay: Payer: Commercial Managed Care - PPO | Admitting: Licensed Clinical Social Worker

## 2024-02-08 ENCOUNTER — Encounter: Payer: Self-pay | Admitting: Physical Therapy

## 2024-02-08 ENCOUNTER — Ambulatory Visit
Admission: RE | Admit: 2024-02-08 | Discharge: 2024-02-08 | Disposition: A | Discharge status: Home / Self Care | Payer: Commercial Managed Care - PPO | Source: Ambulatory Visit | Attending: Radiation Oncology | Admitting: Radiation Oncology

## 2024-02-08 DIAGNOSIS — R41842 Visuospatial deficit: Secondary | ICD-10-CM

## 2024-02-08 DIAGNOSIS — R29818 Other symptoms and signs involving the nervous system: Secondary | ICD-10-CM

## 2024-02-08 DIAGNOSIS — R2681 Unsteadiness on feet: Secondary | ICD-10-CM

## 2024-02-08 DIAGNOSIS — M6281 Muscle weakness (generalized): Secondary | ICD-10-CM

## 2024-02-08 DIAGNOSIS — Z510 Encounter for antineoplastic radiation therapy: Secondary | ICD-10-CM | POA: Diagnosis not present

## 2024-02-08 DIAGNOSIS — R29898 Other symptoms and signs involving the musculoskeletal system: Secondary | ICD-10-CM

## 2024-02-08 DIAGNOSIS — R278 Other lack of coordination: Secondary | ICD-10-CM

## 2024-02-08 LAB — RAD ONC ARIA SESSION SUMMARY
Course Elapsed Days: 17
Plan Fractions Treated to Date: 14
Plan Prescribed Dose Per Fraction: 2 Gy
Plan Total Fractions Prescribed: 23
Plan Total Prescribed Dose: 46 Gy
Reference Point Dosage Given to Date: 28 Gy
Reference Point Session Dosage Given: 2 Gy
Session Number: 14

## 2024-02-08 NOTE — Therapy (Signed)
OUTPATIENT OCCUPATIONAL THERAPY NEURO TREATMENT  Patient Name: Nicholas Stevens MRN: [id number] DOB:Oct 04, 1971, 53 y.o., male Today's Date: 02/08/2024  PCP: Tresa Garter, MD REFERRING PROVIDER: Charlton Amor, PA-C   END OF SESSION:  OT End of Session - 02/08/24 0845     Visit Number 8    Number of Visits 16    Date for OT Re-Evaluation 03/22/24    Authorization Type [employer]    OT Start Time 0845    OT Stop Time 0930    OT Time Calculation (min) 45 min    Equipment Utilized During Treatment scarves, pen/paper    Activity Tolerance Patient tolerated treatment well    Behavior During Therapy WFL for tasks assessed/performed               Past Medical History:  Diagnosis Date   DDD (degenerative disc disease), lumbar    HNP (herniated nucleus pulposus)    Hypertension    Liver hemangioma    Morbid obesity (HCC)    OSA (obstructive sleep apnea)    no cpap used since weight loss   Overweight(278.02)    Plantar fasciitis of right foot    Sleep apnea    Tinea barbae    Past Surgical History:  Procedure Laterality Date   BREATH TEK H PYLORI N/A 08/20/2013   Procedure: BREATH TEK H PYLORI;  Surgeon: Valarie Merino, MD;  Location: [facility];  Service: General;  Laterality: N/A;   COLONOSCOPY WITH PROPOFOL N/A 05/05/2022   Procedure: COLONOSCOPY WITH PROPOFOL;  Surgeon: Beverley Fiedler, MD;  Location: [facility];  Service: Gastroenterology;  Laterality: N/A;   CRANIOTOMY Right 12/18/2023   Procedure: Right Frontal Stereotactic Craniotomy for Resection of Tumor;  Surgeon: Bedelia Person, MD;  Location: [facility];  Service: Neurosurgery;  Laterality: Right;   ESOPHAGOGASTRODUODENOSCOPY (EGD) WITH PROPOFOL N/A 03/15/2023   Procedure: ESOPHAGOGASTRODUODENOSCOPY (EGD) WITH PROPOFOL;  Surgeon: Iva Boop, MD;  Location: [facility];  Service: Gastroenterology;  Laterality: N/A;   LAPAROSCOPIC APPENDECTOMY  2007   Dr Carolynne Edouard   LAPAROSCOPIC GASTRIC  SLEEVE RESECTION N/A 10/08/2013   Procedure: LAPAROSCOPIC SLEEVE GASTRECTOMY;  Surgeon: Valarie Merino, MD;  Location: [facility];  Service: General;  Laterality: N/A;   LUMBAR LAMINECTOMY/DECOMPRESSION MICRODISCECTOMY N/A 09/16/2015   Procedure: MICRO LUMBAR DECOMPRESSION, MICRODISCECTOMY L5 - S1;  Surgeon: Jene Every, MD;  Location: [facility];  Service: Orthopedics;  Laterality: N/A;   POLYPECTOMY  05/05/2022   Procedure: POLYPECTOMY;  Surgeon: Beverley Fiedler, MD;  Location: [facility];  Service: Gastroenterology;;   ROTATOR CUFF REPAIR Right 2005   Dr Lajoyce Corners   Patient Active Problem List   Diagnosis Date Noted   Focal seizure (HCC) 01/22/2024   Allergic drug rash 12/29/2023   DVT, lower extremity, distal (HCC) 12/29/2023   Frontal glioblastoma (HCC) 12/26/2023   Brain mass 12/15/2023   Pseudofolliculitis barbae 05/01/2023   Iron deficiency 04/06/2023   Abdominal pain 03/15/2023   Microcytosis 03/14/2023   RUQ pain 03/12/2023   Abnormal EKG 03/12/2023   Chest pain 03/12/2023   Adult acne 01/19/2023   Obesity (BMI 30-39.9) 01/19/2023   Benign neoplasm of cecum    Benign neoplasm of ascending colon    Benign neoplasm of transverse colon    Difficult airway for intubation 01/18/2022   Upper respiratory disease 01/31/2019   Cerumen impaction 07/30/2018   HTN (hypertension) 11/03/2017   Spinal stenosis of lumbar region 09/16/2015   Insomnia 04/15/2015   Well adult exam  11/04/2014   H/O gastric sleeve 10/08/2013   Hypogonadism, male 07/17/2013   Erectile dysfunction 03/22/2013   Concentration deficit 04/27/2011   Attention or concentration deficit 04/27/2011   TINEA BARBAE 03/04/2009   LIVER HEMANGIOMA 03/04/2009   OVERWEIGHT-BMI 42 03/04/2009   OSA on CPAP 03/04/2009   DEGENERATIVE DISC DISEASE, LUMBAR SPINE 03/04/2009    ONSET DATE: 12/27/22 (referral date)  REFERRING DIAG: C71.9 (ICD-10-CM) - Malignant neoplasm of brain, unspecified   THERAPY DIAG:  Other lack of  coordination  Muscle weakness (generalized)  Visuospatial deficit  Other symptoms and signs involving the nervous system  Other symptoms and signs involving the musculoskeletal system  Rationale for Evaluation and Treatment: Rehabilitation  SUBJECTIVE:   SUBJECTIVE STATEMENT:  Pt arrived with his wife this morning and reported that he has been driving daily and has progressed from neighborhood to busier locations ie) [employer] and [address].     Pt accompanied by: self and significant other - Nicholas Stevens   PERTINENT HISTORY:  PMH includes: HTN, obesity (gastric sleeve), OSA, lumbar DDD.   53 yo male presenting 12/20 with decreased response time while driving, incoordination, imbalance. Pt was admitted to [facility] from ED on 12/15/23;  Imaging showed necrotic and hemorrhagic mass in the superior right frontal lobe with features of glioblastoma with mass effect. underwent craniotomy for brain tumor (with features of gliobastoma per chart note) on 12/18/23;  pt was transferred to inpatient rehab on 12/26/23 and discharged home with wife on 12/31/23.     Per 12/27/23 acute OT evaluation: "history of OSA, HTN, obesity s/p gastric sleeve, liver hemangioma who was admitted on 12/15/23 with reports of decrease in coordination, minimal use of LUE/LLE and fall prompting treatment. MRI brain done revealing necrotic, hemorrhagic mass in superior right frontal lobe with features of glioblastoma and mass effect with very early right uncal herniation.  He underwent right frontal stereotactic  craniotomy for resection of frontal lobe tumor on 12/23 by Dr. Maisie Fus.  Post op has had improvement in LUE strength and follow up MRI showed good debulking of tumor with decrease in mass effect, collapse of lesion with region of enhancement  and 5 mm right to left shift. Pathology revealed high grade glioma grade 4 c/w glioblastoma."  PRECAUTIONS: Fall and Other: No driving  WEIGHT BEARING RESTRICTIONS: No  PAIN:  Are  you having pain? No  FALLS: Has patient fallen in last 6 months? Yes. Number of falls 1   Pt reported he was working the day of the incident and noticed his reaction time was slow.  He just thought his cold medincine was making him slow, but he ended up falling into the nightstand and wall and couldn't get himself up due to L UE/LE weakness prompting ED visit and hospitalization.  LIVING ENVIRONMENT: Lives with: lives with their family, lives with their spouse (high school sweethearts), and lives with youngest 68 yo son - 1 of 3 children) Lives in: House Stairs: Yes: Internal: 14  steps; can reach both and External: 1 threshold  steps; none  Master bedroom on main level  - has not been up steps to 2nd level of home yet as only been home for 2 days  Has following equipment: has built in seat in walk-in shower but not using it   PLOF: Independent - has a home gym, worked in [employer] examiner and had driven to/from [employer] on the day of the incident  PATIENT GOALS: To improve my strength and balance.  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  HAND DOMINANCE: Right  ADLs: Overall ADLs: Pt has resumed all ADLs on his own, even standing in shower, managing fasteners although they may take a little longer.  IADLs: Shopping: Goes out with wife but unable to drive Light housekeeping: Wife isn't currently letting him Meal Prep: Wife is still doing meal prep although he was primary with this task previously Community mobility: Ind Medication management: Wife has it set up for him Financial management: Assisting wife, able to recall account numbers etc Handwriting: 100% legible - no change R UE unaffected  MOBILITY STATUS:  Independent  POSTURE COMMENTS:  forward head Sitting balance: WNL  ACTIVITY TOLERANCE: Activity tolerance: limited  FUNCTIONAL OUTCOME MEASURES: Eval: Lawton-Brody IADL Scale: 3/8 01/31/24: IADL Scale: 7/8   UPPER EXTREMITY ROM:    Active ROM Right eval Left eval Left  01/31/24  Shoulder flexion WNL Slight end range limitations WNL  Shoulder abduction     Shoulder adduction     Shoulder extension     Shoulder internal rotation     Shoulder external rotation     Elbow flexion     Elbow extension     Wrist flexion     Wrist extension     Wrist ulnar deviation     Wrist radial deviation     Wrist pronation     Wrist supination     (Blank rows = not tested)  UPPER EXTREMITY MMT:     MMT Right eval Left eval Left 01/31/24  Shoulder flexion 5/5 4/5 4+  Shoulder abduction     Shoulder adduction     Shoulder extension     Shoulder internal rotation     Shoulder external rotation     Middle trapezius     Lower trapezius     Elbow flexion   5  Elbow extension   5  Wrist flexion     Wrist extension     Wrist ulnar deviation     Wrist radial deviation     Wrist pronation     Wrist supination     (Blank rows = not tested)  HAND FUNCTION: Grip strength: Right: 60.4 - 1st attempt not included, 82.2, 86.1, 106.0  lbs; Left: 51.3, 54.8, 52.6 lbs Average: Right: 91.4 lbs Left: 52.9 lbs   01/22/24 -  Right , 96, 88, 99 Left 45, 47, 66, 47, 64  Average Right 94.3 lbs Left: 53.8 lbs  COORDINATION: 9 Hole Peg test: Right: 19.15 sec; Left: 32.18 sec Box and Blocks:  Right 51 blocks, Left 37 blocks  01/22/24 - Left: 9 hole peg test - 23.05 sec Left: Box and Blocks test - 53 blocks  01/31/24 - Grooved peg test Right: Inserted all pegs in 1:19.91- no drops; Total time with removing pegs 1:44.15 no drops Left: Inserted all pegs in 1:32.44 - 1 drop, Total time with removing pegs 1:58.19 removed 1 slip  SENSATION: WFL  EDEMA: NA  MUSCLE TONE: WFL  COGNITION: Overall  cognitive status: Within functional limits for tasks assessed Recalled account number to bills independently VISION: Subjective report: Drops for glaucoma prevention/keep pressure down Baseline vision: Wears glasses for reading only Visual history:  NA  VISION ASSESSMENT: [facility]  Patient  has difficulty with following activities due to following visual impairments: NA  PERCEPTION: Not tested  PRAXIS: Not tested  OBSERVATIONS at eval: Pt ambulated with use of no AE and no loss of balance but with mildly decreased L arm swing and what OTR still noted as a slight favoring of R side with gaze. The pt is well kept and was accompanied by his high school sweetheart/wife who did admit to overdoing it when helping him at times when pointed out by OTR.                                                                                                                            TREATMENT DATE: 02/08/24  Therapeutic Activities Pt engaged in written deduction puzzle using clues to determine 5 shop owners and their matching 5 stores.  Pt able to determine 4/5 shop owners and 2/5 stores.  Pt missed 1 complete owner/store and mixed up location of 1 store.  Pt able to match 6 direct clues but had difficulty with the additional 4 clues that required increased deduction.  Task followed with scarf tossing activity, walking up and down the hallway, with 2 scarfs with OT ie) tossing them back and forth for him to catch out of the air and toss back to OT. White and green scarf used and pt encouraged to alternate passing and catching scarfs tossed, initially same side and then crossing midline, at rapid pace with OT while conversing. Pt ambulated forwards and then backwards in empty hallway with no loss of balance. Pt does well with catching scarfs tossed to him with 75% success alternating L/R while crossing midline.  Pt engaged in cognitive tasks while walking including 1) listing work tasks - with ability to list computer,  firearms, driving, supervising, etc and 2) listing animals - with success at listing ~ 10 animals but did not continue listing them without additional cues.   Finally, pt engaged in categorizing a shopping list with good success categorizing items and then sorted 24/25 items from the list into 4 stores and estimating cost to each item with time expiring before he could estimate amount of money needed at each store.   PATIENT EDUCATION: Education details: Multi-tasking and cognitive activities   Person educated: Patient and Spouse Education method: Explanation, Demonstration, and Verbal cues Education comprehension: verbalized understanding, returned demonstration, and needs further education  HOME EXERCISE PROGRAM: 01/11/24 - FM coordination HEP 01/12/24 - BUE strengthening Theraband - Access Code: [facility] 01/22/24 - Putty Exercises/activities - Access Code: [email or web address]  GOALS: Goals reviewed with patient? Yes   Remaining/Additional GOALS: Target date: 03/01/24  2.  Patient will demonstrate at least 70+ lbs LUE grip strength as needed to open jars and other containers. Baseline: 52.9 lbs Goal status: IN Progress 01/22/24: Left: 53.8 lbs (greatest squeeze 66 lbs)  NEW GOAL: 8. 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  Baseline: 14-19 WPM with 98% accuracy   Goal Status: INITIAL  9. Patient will safely and comfortable perform simulated job related tasks > 30 minutes at a time without rests including but not limited to managing large gatherings including negotiating busy/tight spaces and managing unexpected interruptions - visually and tacitly.  Baseline: Out of work since brain surgery  Goal Status: INITIAL  GOALS MET  Patient will demonstrate initial LUE strength/coordination HEP with visual handouts only for proper execution to resume use of home gym including [employer]  etc. Baseline: New to outpt OT Goal status: MET  3.  Patient will demo improved FM coordination as evidenced by completing nine-hole peg with use of LUE in 25 seconds or less. Baseline: 32.18 Goal status: MET 01/22/24: Left 23.05 sec  4.  Pt will be able to place at least 42+ blocks using left hand with completion of Box and Blocks test. Baseline: 37 blocks Goal status: MET 01/22/24: 53 blocks  5.  Patient will demonstrate at least 95% accuracy with environmental visual scanning in distracting environment to assist with ADLs and IADLS including eventual driving considerations and work-related tasks as Hydrographic surveyor.   Baseline: New to outpt OT Goal status: MET 01/24/24: Pt reported driving in neighborhood  6.  Pt will have adequate standing tolerance and balance to safely complete IADLS without supervision including preparing meals per PLOF. Baseline: Spouse is preparing meals. Brody-Lawton Scale 3/8 Goal status: MET 01/22/24: Spouse and wife reports he is making meals again. 01/31/24 Brody-Lawton Scale 7/8  7. Patient will demo improved BUE FM coordination as evidenced by independence with sorting medication on his own without dropping pills.  Baseline: Wife is assisting him Goal status: MET  ASSESSMENT:  CLINICAL IMPRESSION: Patient is a 53 y.o. male who was seen today for occupational therapy treatment for residual deficits s/p glioblastoma and craniotomy. Pt engaged in multi-tasking activities involving cognitive tasks and physical activities both individually and simultaneously.  Pt did have some limitations with deduction tasks as well as prolonged cognitive/physical attention.  Pt is continuing cancer treatments and will benefit from continue therapy to ensure maintenance of max strength, coordination and functional abilities.        PERFORMANCE DEFICITS: in functional skills including IADLs, coordination, dexterity, proprioception, ROM, strength, Fine motor control,  Gross motor control, balance, endurance, vision, and UE functional use, cognitive skills including attention, and psychosocial skills including coping strategies and routines and behaviors.   IMPAIRMENTS: are limiting patient from IADLs, work, leisure, and social participation.   CO-MORBIDITIES: may have co-morbidities  that affects occupational performance. Patient will benefit from skilled OT to address above impairments and improve overall function.  REHAB POTENTIAL: Excellent   PLAN:  OT FREQUENCY: 1x/week  OT DURATION: up to additional 6 weeks but planning for 4 weeks  PLANNED INTERVENTIONS: 97535 self care/ADL training, 16109 therapeutic exercise, 97530 therapeutic activity, 97112 neuromuscular re-education, functional mobility training, coping strategies training, and patient/family education  RECOMMENDED OTHER SERVICES: PT also continuing 1x/week through radiation  CONSULTED AND AGREED WITH PLAN OF CARE: Patient and family member/caregiver  PLAN FOR NEXT SESSION:  Strengthening/gripHEP - progress HEP as tolerated Body blade - LUE especially Scanning with distractions/Reaction times - [employer] Typing assessment and other work related tasks Multi-tasking    Victorino Sparrow, OT 02/08/2024, 2:18 PM

## 2024-02-08 NOTE — Therapy (Unsigned)
OUTPATIENT PHYSICAL THERAPY NEURO TREATMENT NOTE    Patient Name: Nicholas Stevens MRN: [id number] DOB:05/15/1971, 53 y.o., male Today's Date: 02/09/2024   PCP: Jenelle Mages., MD REFERRING PROVIDER: Charlton Amor, PA-C  END OF SESSION:  PT End of Session - 02/09/24 1942     Visit Number 13    Number of Visits 18    Date for PT Re-Evaluation 03/22/24    Authorization Type UHC    Authorization Time Period 01-02-24 - 03-01-24    Equipment Utilized During Treatment Gait belt    Activity Tolerance Patient tolerated treatment well    Behavior During Therapy [facility] for tasks assessed/performed              Past Medical History:  Diagnosis Date   DDD (degenerative disc disease), lumbar    HNP (herniated nucleus pulposus)    Hypertension    Liver hemangioma    Morbid obesity (HCC)    OSA (obstructive sleep apnea)    no cpap used since weight loss   Overweight(278.02)    Plantar fasciitis of right foot    Sleep apnea    Tinea barbae    Past Surgical History:  Procedure Laterality Date   BREATH TEK H PYLORI N/A 08/20/2013   Procedure: BREATH TEK Richardo Priest;  Surgeon: Valarie Merino, MD;  Location: [facility];  Service: General;  Laterality: N/A;   COLONOSCOPY WITH PROPOFOL N/A 05/05/2022   Procedure: COLONOSCOPY WITH PROPOFOL;  Surgeon: Beverley Fiedler, MD;  Location: [facility];  Service: Gastroenterology;  Laterality: N/A;   CRANIOTOMY Right 12/18/2023   Procedure: Right Frontal Stereotactic Craniotomy for Resection of Tumor;  Surgeon: Bedelia Person, MD;  Location: [facility] OR;  Service: Neurosurgery;  Laterality: Right;   ESOPHAGOGASTRODUODENOSCOPY (EGD) WITH PROPOFOL N/A 03/15/2023   Procedure: ESOPHAGOGASTRODUODENOSCOPY (EGD) WITH PROPOFOL;  Surgeon: Iva Boop, MD;  Location: [facility];  Service: Gastroenterology;  Laterality: N/A;   LAPAROSCOPIC APPENDECTOMY  2007   Dr Carolynne Edouard   LAPAROSCOPIC GASTRIC SLEEVE RESECTION N/A 10/08/2013   Procedure:  LAPAROSCOPIC SLEEVE GASTRECTOMY;  Surgeon: Valarie Merino, MD;  Location: [facility];  Service: General;  Laterality: N/A;   LUMBAR LAMINECTOMY/DECOMPRESSION MICRODISCECTOMY N/A 09/16/2015   Procedure: MICRO LUMBAR DECOMPRESSION, MICRODISCECTOMY L5 - S1;  Surgeon: Jene Every, MD;  Location: [facility];  Service: Orthopedics;  Laterality: N/A;   POLYPECTOMY  05/05/2022   Procedure: POLYPECTOMY;  Surgeon: Beverley Fiedler, MD;  Location: [facility];  Service: Gastroenterology;;   ROTATOR CUFF REPAIR Right 2005   Dr Lajoyce Corners   Patient Active Problem List   Diagnosis Date Noted   Seizure disorder (HCC) 02/09/2024   Focal seizure (HCC) 01/22/2024   Allergic drug rash 12/29/2023   DVT, lower extremity, distal (HCC) 12/29/2023   Frontal glioblastoma (HCC) 12/26/2023   Brain mass 12/15/2023   Pseudofolliculitis barbae 05/01/2023   Iron deficiency 04/06/2023   Abdominal pain 03/15/2023   Microcytosis 03/14/2023   RUQ pain 03/12/2023   Abnormal EKG 03/12/2023   Chest pain 03/12/2023   Adult acne 01/19/2023   Obesity (BMI 30-39.9) 01/19/2023   Benign neoplasm of cecum    Benign neoplasm of ascending colon    Benign neoplasm of transverse colon    Difficult airway for intubation 01/18/2022   Upper respiratory disease 01/31/2019   Cerumen impaction 07/30/2018   HTN (hypertension) 11/03/2017   Spinal stenosis of lumbar region 09/16/2015   Insomnia 04/15/2015   Well adult exam 11/04/2014   H/O gastric sleeve 10/08/2013  Hypogonadism, male 07/17/2013   Erectile dysfunction 03/22/2013   Concentration deficit 04/27/2011   Attention or concentration deficit 04/27/2011   TINEA BARBAE 03/04/2009   LIVER HEMANGIOMA 03/04/2009   OVERWEIGHT-BMI 42 03/04/2009   OSA on CPAP 03/04/2009   DEGENERATIVE DISC DISEASE, LUMBAR SPINE 03/04/2009    ONSET DATE: 12-15-23  REFERRING DIAG: C71.9 (ICD-10-CM) - Malignant neoplasm of brain, unspecified  THERAPY DIAG:  Muscle weakness (generalized)  Unsteadiness  on feet  Rationale for Evaluation and Treatment: Rehabilitation  SUBJECTIVE:                                                                                                                                                                                             SUBJECTIVE STATEMENT: Patient reports he felt a little shaky this morning - is about halfway through with radiation treatments; feels ok, just a little shaky  Per chart note:  53 yo male presenting 12/20 to ED with decreased response time while driving, incoordination, imbalance. Imaging showed necrotic and hemorrhagic mass in the superior right frontal lobe with features of glioblastoma with mass effect. S/p R craniotomy 12/23. PMH includes: obesity and OSA.   Pt accompanied by:  wife  - [name]  PERTINENT HISTORY: history of OSA, HTN, Tinea barbae, obesity s/p gastric sleeve, liver hemangioma who was admitted on 12/15/23 with reports of decrease in coordination, minimal use of LUE/LLE and; hospital admission 12-15-23 with transfer to inpatient rehab 12-26-23 - 12-31-23  PAIN:  Are you having pain? No  PRECAUTIONS: Other: No driving; Fall  RED FLAGS: None   WEIGHT BEARING RESTRICTIONS: No  FALLS: Has patient fallen in last 6 months? No  LIVING ENVIRONMENT: Lives with: lives with their spouse Lives in: House/apartment Stairs: Yes: Internal: 12 steps; can reach both master bedroom on main level  - has not been up steps to 2nd level of home yet as only been home for 2 days Has following equipment at home: None  PLOF: Independent; pt retired from [employer] 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  DIAGNOSTIC FINDINGS: MRI brain done revealing necrotic, hemorrhagic mass in superior right frontal lobe with features of glioblastoma and mass effect with very early right uncal herniation.  COGNITION: Overall cognitive status: Within functional limits for tasks assessed and  appears to have slightly delayed processing - see OT eval for details   SENSATION: WFL  COORDINATION: WFL's bil. LE's  POSTURE: No Significant postural limitations  LOWER EXTREMITY ROM:   WNL's bil. LE's   LOWER EXTREMITY MMT:  RLE WNL's  MMT Right Eval Left Eval  Hip flexion 5 4-  Hip extension 5 3-  Hip abduction  4  Hip adduction    Hip internal rotation    Hip external rotation    Knee flexion 5 4-  Knee extension 5 5  Ankle dorsiflexion 5 5  Ankle plantarflexion 5 5  Ankle inversion    Ankle eversion    (Blank rows = not tested)  BED MOBILITY:  Independent  TRANSFERS: Assistive device utilized: None  Sit to stand: Modified independence Stand to sit: Modified independence  RAMP: TBA  CURB: TBA  STAIRS: TBA   GAIT: Gait pattern: step through pattern and decreased arm swing- Left Distance walked: 100' Assistive device utilized: None Level of assistance: SBA Comments: mildly decreased Lt arm swing   FUNCTIONAL TESTS:  5 times sit to stand: 11.69 secs from chair without UE support Timed up and go (TUG): 10.87 secs without device 10 meter walk test: 11.68 secs without device = 2.81 ft/sec Berg Balance Scale: 54/56   PATIENT SURVEYS:  N/A due to dx of brain tumor                                                                                                                            TREATMENT DATE:  02-08-24   NeuroRe-ed: Sit to stand on Airex 5 reps with EO:  5 reps with EC  Standing on Airex with horizontal head turns EO and then EC 5 reps each;  vertical head turns EO and EC 5 reps each Marching with EO on Airex - no head turns 5 reps;  with head  turns side to side EO 5 reps with CGA  TherEx:   Bridging x 5 reps with soccer ball between knees 10 reps;   Lt unilateral bridge 10 reps Bridging with marching 5 reps:  bridging with RLE extension 5 reps:  bridging with hip abdct./adduction 5 reps - rest breaks after each bridge exercise and cues to perform slowly as pt has tendency to perform exercises quickly  Lt SLR - 3# weight 10 reps;   Lt hip flexion in hooklying position with 3# weight 10 reps;   Pt performed Lt SLR circles CW 10 reps, CCW 10 reps - no weight used  Lt hip abduction in Rt sidelying position - 3# weight - 10 reps;  Clam shell exercise with 3# weight 10 reps In Rt sidelying - pt performed Lt hip flexion/extension with LLE held in abduction:  10 reps (no weight)  Prone Lt knee flexion with 4# weight 10 reps x 2 sets - cues for controlled descent Prone hip extension with knee flexed at 90 degrees with 3# weight x 10 reps;  Lt hip ext with knee extended with 3# weight 10 reps  Quadruped position - birddog 2 reps each side 5 sec hold  Prone plank - 2 reps 15 sec hold for core strengthening    TherAct: Lifting LLE over/back of orange hurdle (lower height) with 3# weight for hip flexor/extensor strengthening  Resisted ambulation with green theraband - 20' x 2 reps - sidestepping with squats; forwards amb. 20' x 2 reps;  backwards amb. 20' x 2 reps   3# on LLE - tap ups to 2nd step 10 reps;  tap ups to 3rd step 10 reps;  lateral step ups to 6" step with RLE with LLE abduction with knee extended         PATIENT EDUCATION: Education details: See above + progress on goals, + additions to HEP Person educated: Patient and wife Education method: Explanation, Demonstration, Verbal cues, and Handouts Education comprehension: verbalized understanding and returned demonstration  HOME EXERCISE PROGRAM: Access Code: [email or web address] URL: [email or web address] Date: 02/02/2024 Prepared by: Maryruth Eve  Exercises - Supine Active Straight Leg Raise  - 1 x daily - 7 x weekly - 3 sets - 10 reps - SIDELYING LEG LIFTS; ALSO DO CIRCLES CLOCKWISE & COUNTERCLOCKWISE  - 1 x daily - 7 x weekly - 3 sets - 10 reps - Prone Hip Extension  - 1 x daily - 7 x weekly - 3 sets - 10 reps - Clamshell  - 1 x daily - 7 x weekly - 3 sets - 10 reps - 5 sec hold - Prone Knee Flexion AROM  - 1 x daily - 7 x weekly - 3 sets - 10 reps - Single Leg Stance  - 1 x daily - 7 x weekly - 1 sets - 2-3 reps - 15 sec hold - Tandem Stance  - 1 x daily - 7 x weekly - 1 sets - 2 reps - 30 sec  hold - Romberg Stance Eyes Closed on Foam Pad  - 1 x daily - 5 x weekly - 3 sets - 30 hold - Narrow Stance with Eyes Closed and Head Rotation on Foam Pad  - 1 x daily - 5 x weekly - 2 sets - 10 reps - Single Leg Bridge  - 1 x daily - 5 x weekly - 2 sets - 10 reps - Staggered Sit-to-Stand  - 1 x daily - 7 x weekly - 4 sets - 5-10 reps - (first two sets with full sit to 10 reps, second two sets with 5 reps with tap) hold  GOALS: Goals reviewed with patient? Yes  SHORT TERM GOALS: SAME AS LTG'S AS ELOS = 4 WEEKS  LONG TERM GOALS: Target date: 02-02-24  Assess FGA and set goal as appropriate. Baseline: score 30/30 on 01-29-24 Goal status: Goal met 01-29-24  2.  Improve Berg score to 56/56 to demo improved static standing balance. Baseline: 54/56 on 01-02-24, 54/56  Goal status: NOT MET  3.  Pt will amb. 10" nonstop on various surfaces including grass, pavement and indoor, without LOB and with no c/o fatigue, demonstrating Lt arm swing, with supervision.  Baseline:  Goal status: Goal met 01-29-24  4.  Increase gait velocity to >/= 3.6 ft/sec without use of device for increased gait efficiency.  Baseline: 2.81 ft/sec (11.68 secs); improved to 3.76 ft/sec Goal status: MET  5.  Pt will jog 115' on flat, even surface independently without LOB and RPE </= 2/10.  Baseline: able to jog 115' independently, RPE to be  assessed Goal status: IN  PROGRESS, MET reports RPE 2/10  6.  Negotiate 4 steps using step over step sequence with supervision without use of hand rail to demo improved balance. Baseline: improved to 4 steps alt pattern with supervision no handrail Goal status: MET  7.  Independent in HEP for LLE strengthening, balance and coordination.   Baseline: reports goo understanding so far  Goal status: IN PROGRESS   8.  Pt will improve composite score to above normal for age in order to demo improved balance.   Baseline: 64 (below normal), 89 (above age matched norms)   Goal status: MET  LONG TERM GOALS: Target date: 03/22/2024  Patient will improve single leg stance to 10 seconds bilaterally to demonstrate progress towards decreased risk for falls.  Baseline:  seconds on R, 8 seconds on the L  Goal status: Goal met 2-3-2  2.  Independent in HEP for LLE strengthening, balance and coordination.   Baseline: reports good understanding thus far, requires progression  Goal status: IN PROGRESS    ASSESSMENT:  CLINICAL IMPRESSION:  PT session focused on LLE strengthening, balance exercises on compliant surface to increase vestibular input in maintaining balance and core strengthening exercises.  Pt able to perform PRE's with 3# and 4# weight.  Pt had mild unsteadiness with standing on Airex with vertical head turns with EC but had no significant LOB.  Cont with POC.     OBJECTIVE IMPAIRMENTS: Abnormal gait, decreased activity tolerance, decreased balance, decreased coordination, and decreased strength.   ACTIVITY LIMITATIONS: carrying, lifting, squatting, stairs, and locomotion level  PARTICIPATION LIMITATIONS: cleaning, laundry, driving, shopping, community activity, and occupation  PERSONAL FACTORS: Profession and 1 comorbidity: s/p craniotomy due to brain tumor  are also affecting patient's functional outcome.   REHAB POTENTIAL: Good  CLINICAL DECISION MAKING: Evolving/moderate complexity  EVALUATION COMPLEXITY:  Moderate  PLAN:  PT FREQUENCY: 3x/week + 1x/week  PT DURATION: 4 weeks + 5 weeks (to get through radiation treatment)   PLANNED INTERVENTIONS: 97110-Therapeutic exercises, 97530- Therapeutic activity, 97112- Neuromuscular re-education, [phone]- Self Care, [id number]- Gait training, [phone]- Aquatic Therapy, Patient/Family education, Balance training, and Stair training  PLAN FOR NEXT SESSION: SLS work progression, glute med strength, endurance with high level tasks  work on LLE strengthening, - Lt hip abductor , flexor and hip extensor strengthening:   elliptical, high level balance and strengthening, balance with EC/compliant surfaces  Deadrian Toya, Donavan Burnet, PT 02/09/2024, 7:43 PM

## 2024-02-09 ENCOUNTER — Ambulatory Visit
Admission: RE | Admit: 2024-02-09 | Discharge: 2024-02-09 | Disposition: A | Discharge status: Home / Self Care | Payer: Commercial Managed Care - PPO | Source: Ambulatory Visit | Attending: Radiation Oncology | Admitting: Radiation Oncology

## 2024-02-09 ENCOUNTER — Other Ambulatory Visit: Payer: Self-pay

## 2024-02-09 DIAGNOSIS — G40909 Epilepsy, unspecified, not intractable, without status epilepticus: Secondary | ICD-10-CM | POA: Insufficient documentation

## 2024-02-09 DIAGNOSIS — Z510 Encounter for antineoplastic radiation therapy: Secondary | ICD-10-CM | POA: Diagnosis not present

## 2024-02-09 LAB — RAD ONC ARIA SESSION SUMMARY
Course Elapsed Days: 18
Plan Fractions Treated to Date: 15
Plan Prescribed Dose Per Fraction: 2 Gy
Plan Total Fractions Prescribed: 23
Plan Total Prescribed Dose: 46 Gy
Reference Point Dosage Given to Date: 30 Gy
Reference Point Session Dosage Given: 2 Gy
Session Number: 15

## 2024-02-09 NOTE — Assessment & Plan Note (Signed)
On XRT x 6 wks, oral chemo. On steroids taper. S/p craniotomy with resection of frontal glioblastoma; Dr. Maisie Fus, glioblastoma, 12/18/23 Keppra was stopped 1/13 seizure re-occurred.... Back on seizure rx

## 2024-02-09 NOTE — Assessment & Plan Note (Signed)
On XRT x 6 wks, oral chemo. On steroids taper. S/p craniotomy with resection of frontal glioblastoma; Dr. Maisie Fus, glioblastoma, 12/18/23 Keppra was stopped 1/13 seizure re-occurred.... Back on seizure rx - Keppra

## 2024-02-12 ENCOUNTER — Other Ambulatory Visit: Payer: Self-pay

## 2024-02-12 ENCOUNTER — Encounter: Payer: Self-pay | Admitting: Internal Medicine

## 2024-02-12 ENCOUNTER — Ambulatory Visit
Admission: RE | Admit: 2024-02-12 | Discharge: 2024-02-12 | Disposition: A | Discharge status: Home / Self Care | Payer: Commercial Managed Care - PPO | Source: Ambulatory Visit | Attending: Radiation Oncology | Admitting: Radiation Oncology

## 2024-02-12 DIAGNOSIS — Z510 Encounter for antineoplastic radiation therapy: Secondary | ICD-10-CM | POA: Diagnosis not present

## 2024-02-12 LAB — RAD ONC ARIA SESSION SUMMARY
Course Elapsed Days: 21
Plan Fractions Treated to Date: 16
Plan Prescribed Dose Per Fraction: 2 Gy
Plan Total Fractions Prescribed: 23
Plan Total Prescribed Dose: 46 Gy
Reference Point Dosage Given to Date: 32 Gy
Reference Point Session Dosage Given: 2 Gy
Session Number: 16

## 2024-02-12 NOTE — Progress Notes (Signed)
Brain Tumor Assessment   Clinical social worker met with patient / caregiver (wife Nicholas Stevens)  to complete assessments.      Patient distress screen score:      No data to display         Caregiver distress screen score: 0 Significant physical limits/changes identified:  No       MOCA score:     02/12/2024    2:06 PM  Montreal Cognitive Assessment   Visuospatial/ Executive (0/5) 4  Naming (0/3) 3  Attention: Read list of digits (0/2) 2  Attention: Read list of letters (0/1) 1  Attention: Serial 7 subtraction starting at 100 (0/3) 3  Language: Repeat phrase (0/2) 2  Language : Fluency (0/1) 1  Abstraction (0/2) 2  Delayed Recall (0/5) 4  Orientation (0/6) 6  Total 28    WHO Quality of Life scores:   Overall quality of life: 70   Physical health: 8   Psychological: 75   Social relationship: 73   Environment: 75 Significant findings: Pt and spouse report when pt was initially admitted to the hospital he experienced significant left sided weakness which at the time was extremely scary, but in the interim pt has made significant progress and is physically getting stronger every day.  Pt states his community, both church and [employer], has stepped up in extraordinary ways and he and his wife have felt very blessed to be so supported during this time.    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  Pt's spouse reports the nursing staff was extremely patient and by the time pt returned home she felt confident in the care she needed to provide.  Pt's spouse does work and it has been challenging juggling pt's appointments and fulfilling her own responsibilities, but has been grateful for the support their community has shown during this time.      Nicholas Moulds, LCSW

## 2024-02-13 ENCOUNTER — Other Ambulatory Visit: Payer: Self-pay

## 2024-02-13 ENCOUNTER — Ambulatory Visit
Admission: RE | Admit: 2024-02-13 | Discharge: 2024-02-13 | Disposition: A | Discharge status: Home / Self Care | Payer: Commercial Managed Care - PPO | Source: Ambulatory Visit | Attending: Radiation Oncology

## 2024-02-13 DIAGNOSIS — Z510 Encounter for antineoplastic radiation therapy: Secondary | ICD-10-CM | POA: Diagnosis not present

## 2024-02-13 LAB — RAD ONC ARIA SESSION SUMMARY
Course Elapsed Days: 22
Plan Fractions Treated to Date: 17
Plan Prescribed Dose Per Fraction: 2 Gy
Plan Total Fractions Prescribed: 23
Plan Total Prescribed Dose: 46 Gy
Reference Point Dosage Given to Date: 34 Gy
Reference Point Session Dosage Given: 2 Gy
Session Number: 17

## 2024-02-14 ENCOUNTER — Ambulatory Visit
Admission: RE | Admit: 2024-02-14 | Discharge: 2024-02-14 | Discharge status: Home / Self Care | Payer: Commercial Managed Care - PPO | Source: Ambulatory Visit | Attending: Radiation Oncology

## 2024-02-14 ENCOUNTER — Other Ambulatory Visit: Payer: Self-pay

## 2024-02-14 DIAGNOSIS — Z510 Encounter for antineoplastic radiation therapy: Secondary | ICD-10-CM | POA: Diagnosis not present

## 2024-02-14 LAB — RAD ONC ARIA SESSION SUMMARY
Course Elapsed Days: 23
Plan Fractions Treated to Date: 18
Plan Prescribed Dose Per Fraction: 2 Gy
Plan Total Fractions Prescribed: 23
Plan Total Prescribed Dose: 46 Gy
Reference Point Dosage Given to Date: 36 Gy
Reference Point Session Dosage Given: 2 Gy
Session Number: 18

## 2024-02-15 ENCOUNTER — Inpatient Hospital Stay: Payer: Commercial Managed Care - PPO

## 2024-02-15 ENCOUNTER — Inpatient Hospital Stay: Payer: Commercial Managed Care - PPO | Admitting: Internal Medicine

## 2024-02-15 ENCOUNTER — Other Ambulatory Visit: Payer: Self-pay

## 2024-02-15 ENCOUNTER — Ambulatory Visit: Payer: Commercial Managed Care - PPO | Admitting: Physical Therapy

## 2024-02-15 ENCOUNTER — Ambulatory Visit: Payer: Commercial Managed Care - PPO | Admitting: Occupational Therapy

## 2024-02-15 ENCOUNTER — Ambulatory Visit
Admission: RE | Admit: 2024-02-15 | Discharge: 2024-02-15 | Disposition: A | Discharge status: Home / Self Care | Payer: Commercial Managed Care - PPO | Source: Ambulatory Visit | Attending: Radiation Oncology | Admitting: Radiation Oncology

## 2024-02-15 VITALS — BP 125/74 | HR 71 | Temp 97.5°F | Resp 18 | Ht 74.0 in | Wt 282.5 lb

## 2024-02-15 DIAGNOSIS — G40909 Epilepsy, unspecified, not intractable, without status epilepticus: Secondary | ICD-10-CM

## 2024-02-15 DIAGNOSIS — C711 Malignant neoplasm of frontal lobe: Secondary | ICD-10-CM

## 2024-02-15 DIAGNOSIS — Z510 Encounter for antineoplastic radiation therapy: Secondary | ICD-10-CM | POA: Diagnosis not present

## 2024-02-15 LAB — CMP (CANCER CENTER ONLY)
ALT: 28 U/L (ref 0–44)
AST: 17 U/L (ref 15–41)
Albumin: 4.1 g/dL (ref 3.5–5.0)
Alkaline Phosphatase: 37 U/L — ABNORMAL LOW (ref 38–126)
Anion gap: 4 — ABNORMAL LOW (ref 5–15)
BUN: 10 mg/dL (ref 6–20)
CO2: 33 mmol/L — ABNORMAL HIGH (ref 22–32)
Calcium: 9.4 mg/dL (ref 8.9–10.3)
Chloride: 103 mmol/L (ref 98–111)
Creatinine: 1.03 mg/dL (ref 0.61–1.24)
GFR, Estimated: >60 mL/min (ref >60)
Glucose, Bld: 75 mg/dL (ref 70–99)
Potassium: 3.7 mmol/L (ref 3.5–5.1)
Sodium: 140 mmol/L (ref 135–145)
Total Bilirubin: 0.8 mg/dL (ref 0.0–1.2)
Total Protein: 6.6 g/dL (ref 6.5–8.1)

## 2024-02-15 LAB — CBC WITH DIFFERENTIAL (CANCER CENTER ONLY)
Abs Immature Granulocytes: 0.03 10*3/uL (ref 0.00–0.07)
Basophils Absolute: 0 10*3/uL (ref 0.0–0.1)
Basophils Relative: 1 %
Eosinophils Absolute: 0.1 10*3/uL (ref 0.0–0.5)
Eosinophils Relative: 3 %
HCT: 54.2 % — ABNORMAL HIGH (ref 39.0–52.0)
Hemoglobin: 17 g/dL (ref 13.0–17.0)
Immature Granulocytes: 1 %
Lymphocytes Relative: 30 %
Lymphs Abs: 1.4 10*3/uL (ref 0.7–4.0)
MCH: 26.9 pg (ref 26.0–34.0)
MCHC: 31.4 g/dL (ref 30.0–36.0)
MCV: 85.6 fL (ref 80.0–100.0)
Monocytes Absolute: 0.4 10*3/uL (ref 0.1–1.0)
Monocytes Relative: 9 %
Neutro Abs: 2.7 10*3/uL (ref 1.7–7.7)
Neutrophils Relative %: 56 %
Platelet Count: 226 10*3/uL (ref 150–400)
RBC: 6.33 MIL/uL — ABNORMAL HIGH (ref 4.22–5.81)
RDW: 17.4 % — ABNORMAL HIGH (ref 11.5–15.5)
WBC Count: 4.8 10*3/uL (ref 4.0–10.5)
nRBC: 0 % (ref 0.0–0.2)

## 2024-02-15 LAB — RAD ONC ARIA SESSION SUMMARY
Course Elapsed Days: 24
Plan Fractions Treated to Date: 19
Plan Prescribed Dose Per Fraction: 2 Gy
Plan Total Fractions Prescribed: 23
Plan Total Prescribed Dose: 46 Gy
Reference Point Dosage Given to Date: 38 Gy
Reference Point Session Dosage Given: 2 Gy
Session Number: 19

## 2024-02-15 NOTE — Progress Notes (Signed)
[facility] at [facility] [address]  [place], [place] [address] [phone]   Interval Evaluation  Date of Service: 02/15/24 Patient Name: Nicholas Stevens Patient MRN: [id number] Patient DOB: 24-Mar-1971 Provider: Henreitta Leber, MD  Identifying Statement:  Nicholas Stevens is a 53 y.o. male with right frontal glioblastoma    Oncologic History: Oncology History  Frontal glioblastoma (HCC)  12/18/2023 Surgery   Craniotomy, resection of right frontal mass with Dr. Maisie Fus; path is glioblastoma, IDH pending   01/22/2024 -  Chemotherapy   Patient is on Treatment Plan : BRAIN GLIOBLASTOMA Radiation Therapy With Concurrent Temozolomide 75 mg/m2 Daily Followed By Sequential Maintenance Temozolomide x 6-12 cycles       Biomarkers:  MGMT Unknown.  IDH 1/2 Unknown.  EGFR Unknown  TERT Unknown   Interval History: Nicholas Stevens presents for follow up after completing 4 weeks of radiation and Temodar.  Fatigue is somewhat worse.  He continues on Keppra, but has weaned off decadron fully.  Tolerating RT and chemo well overall.  Denies headaches.   H+P (01/08/24) Patient presented with several days of left sided clumsiness, impaired coordination.  Was found to have large right frontal mass, c/w primary brain tumor.  He underwent craniotomy, resection with Dr. Maisie Fus on 12/18/23; path demonstrated glioblastoma.  Following surgery, he had persistent right sided weakness, he is currently in outpatient PT following discharge from rehab.  No seizures, headaches.     Medications: Current Outpatient Medications on File Prior to Visit  Medication Sig Dispense Refill   apixaban (ELIQUIS) 5 MG TABS tablet Take 1 tablet (5 mg total) by mouth 2 (two) times daily. 60 tablet 0   camphor-menthol (SARNA) lotion Apply topically as needed for itching. 222 mL 0   Cyanocobalamin (B-12 PO) Take 1 tablet by mouth daily.     latanoprost (XALATAN) 0.005 % ophthalmic  solution Place 1 drop into both eyes at bedtime.     levETIRAcetam (KEPPRA) 500 MG tablet Take 1 tablet (500 mg total) by mouth 2 (two) times daily. 60 tablet 0   Multiple Vitamin (MULTIVITAMIN WITH MINERALS) TABS tablet Take 1 tablet by mouth daily.     Omega-3 Fatty Acids (FISH OIL) 1000 MG CAPS Take 1,000 mg by mouth daily.     ondansetron (ZOFRAN) 8 MG tablet Take 1 tablet (8 mg total) by mouth every 8 (eight) hours as needed for nausea or vomiting. May take 30-60 minutes prior to Temodar administration if nausea/vomiting occurs as needed. 30 tablet 1   pantoprazole (PROTONIX) 40 MG tablet Take 1 tablet (40 mg total) by mouth daily. 90 tablet 3   pimecrolimus (ELIDEL) 1 % cream Apply 1 Application topically 2 (two) times daily as needed (Dermatitis).     senna-docusate (SENOKOT-S) 8.6-50 MG tablet Take 2 tablets by mouth daily at 6 (six) AM. 60 tablet 0   temozolomide (TEMODAR) 180 MG capsule Take 1 capsule (180 mg total) by mouth daily. May take on an empty stomach to decrease nausea & vomiting. 42 capsule 0   triamcinolone cream (KENALOG) 0.1 % Apply 1 Application topically daily. Use on affected areas for 2 weeks, then stop for 2 weeks. Repeat until clear if needed. 453 g 0   zolpidem (AMBIEN) 10 MG tablet TAKE 1 TABLET BY MOUTH EVERYDAY AT BEDTIME 90 tablet 1   No current facility-administered medications on file prior to visit.    Allergies: No Known Allergies Past Medical History:  Past Medical History:  Diagnosis Date   DDD (degenerative disc disease), lumbar    HNP (herniated nucleus pulposus)    Hypertension    Liver hemangioma    Morbid obesity (HCC)    OSA (obstructive sleep apnea)    no cpap used since weight loss   Overweight(278.02)    Plantar fasciitis of right foot    Sleep apnea    Tinea barbae    Past Surgical History:  Past Surgical History:  Procedure Laterality Date   BREATH TEK H PYLORI N/A 08/20/2013   Procedure: BREATH TEK H PYLORI;  Surgeon: Valarie Merino, MD;  Location: [facility];  Service: General;  Laterality: N/A;   COLONOSCOPY WITH PROPOFOL N/A 05/05/2022   Procedure: COLONOSCOPY WITH PROPOFOL;  Surgeon: Beverley Fiedler, MD;  Location: [facility];  Service: Gastroenterology;  Laterality: N/A;   CRANIOTOMY Right 12/18/2023   Procedure: Right Frontal Stereotactic Craniotomy for Resection of Tumor;  Surgeon: Bedelia Person, MD;  Location: [facility] OR;  Service: Neurosurgery;  Laterality: Right;   ESOPHAGOGASTRODUODENOSCOPY (EGD) WITH PROPOFOL N/A 03/15/2023   Procedure: ESOPHAGOGASTRODUODENOSCOPY (EGD) WITH PROPOFOL;  Surgeon: Iva Boop, MD;  Location: [facility];  Service: Gastroenterology;  Laterality: N/A;   LAPAROSCOPIC APPENDECTOMY  2007   Dr Carolynne Edouard   LAPAROSCOPIC GASTRIC SLEEVE RESECTION N/A 10/08/2013   Procedure: LAPAROSCOPIC SLEEVE GASTRECTOMY;  Surgeon: Valarie Merino, MD;  Location: [facility];  Service: General;  Laterality: N/A;   LUMBAR LAMINECTOMY/DECOMPRESSION MICRODISCECTOMY N/A 09/16/2015   Procedure: MICRO LUMBAR DECOMPRESSION, MICRODISCECTOMY L5 - S1;  Surgeon: Jene Every, MD;  Location: [facility];  Service: Orthopedics;  Laterality: N/A;   POLYPECTOMY  05/05/2022   Procedure: POLYPECTOMY;  Surgeon: Beverley Fiedler, MD;  Location: [facility];  Service: Gastroenterology;;   ROTATOR CUFF REPAIR Right 2005   Dr Lajoyce Corners   Social History:  Social History   Socioeconomic History   Marital status: Married    Spouse name: Murphy Duzan   Number of children: 3   Years of education: Not on file   Highest education level: 12th grade  Occupational History   Occupation: Doctor, general practice PD    Employer: UNEMPLOYED  Tobacco Use   Smoking status: Never    Passive exposure: Never   Smokeless tobacco: Never  Vaping Use   Vaping status: Never Used  Substance and Sexual Activity   Alcohol use: No   Drug use: No   Sexual activity: Not on file  Other Topics Concern   Not on file  Social History Narrative   Married 18+ years   3  children - ages approx 22, 9 and 5...daughter age 55 w/ DM type 1   Social Drivers of Corporate investment banker Strain: Low Risk  (04/02/2023)   Overall Financial Resource Strain (CARDIA)    Difficulty of Paying Living Expenses: Not hard at all  Food Insecurity: No Food Insecurity (12/18/2023)   Hunger Vital Sign    Worried About Running Out of Food in the Last Year: Never true    Ran Out of Food in the Last Year: Never true  Transportation Needs: No Transportation Needs (12/18/2023)   PRAPARE - Administrator, Civil Service (Medical): No    Lack of Transportation (Non-Medical): No  Physical Activity: Insufficiently Active (04/02/2023)   Exercise Vital Sign    Days of Exercise per Week: 4 days    Minutes of Exercise per Session: 30 min  Stress: No Stress Concern Present (04/02/2023)   [employer] of Occupational  Health - Occupational Stress Questionnaire    Feeling of Stress : Not at all  Social Connections: Socially Integrated (04/02/2023)   Social Connection and Isolation Panel [NHANES]    Frequency of Communication with Friends and Family: More than three times a week    Frequency of Social Gatherings with Friends and Family: Once a week    Attends Religious Services: More than 4 times per year    Active Member of [employer] or Organizations: Yes    Attends Engineer, structural: More than 4 times per year    Marital Status: Married  Catering manager Violence: Not At Risk (12/18/2023)   Humiliation, Afraid, Rape, and Kick questionnaire    Fear of Current or Ex-Partner: No    Emotionally Abused: No    Physically Abused: No    Sexually Abused: No   Family History:  Family History  Problem Relation Age of Onset   Other Mother        hx of DJD   Liver disease Father        ETOH, Hep C   Hypertension Father    Colon cancer Neg Hx    Colon polyps Neg Hx    Esophageal cancer Neg Hx    Rectal cancer Neg Hx    Stomach cancer Neg Hx     Review of  Systems: Constitutional: Doesn't report fevers, chills or abnormal weight loss Eyes: Doesn't report blurriness of vision Ears, nose, mouth, throat, and face: Doesn't report sore throat Respiratory: Doesn't report cough, dyspnea or wheezes Cardiovascular: Doesn't report palpitation, chest discomfort  Gastrointestinal:  Doesn't report nausea, constipation, diarrhea GU: Doesn't report incontinence Skin: Doesn't report skin rashes Neurological: Per HPI Musculoskeletal: Doesn't report joint pain Behavioral/Psych: Doesn't report anxiety  Physical Exam: Vitals:   02/15/24 1437  BP: 125/74  Pulse: 71  Resp: 18  Temp: (!) 97.5 F (36.4 C)  SpO2: 100%   KPS: 80. General: Alert, cooperative, pleasant, in no acute distress Head: Normal EENT: No conjunctival injection or scleral icterus.  Lungs: Resp effort normal Cardiac: Regular rate Abdomen: Non-distended abdomen Skin: No rashes cyanosis or petechiae. Extremities: No clubbing or edema  Neurologic Exam: Mental Status: Awake, alert, attentive to examiner. Oriented to self and environment. Language is fluent with intact comprehension.  Cranial Nerves: Visual acuity is grossly normal. Visual fields are full. Extra-ocular movements intact. No ptosis. Face is symmetric Motor: Tone and bulk are normal. Power is 4+/5 in left arm and leg. Reflexes are symmetric, no pathologic reflexes present.  Sensory: Intact to light touch Gait: Normal.   Labs: I have reviewed the data as listed    Component Value Date/Time   NA 139 02/01/2024 1353   K 4.2 02/01/2024 1353   CL 104 02/01/2024 1353   CO2 32 02/01/2024 1353   GLUCOSE 92 02/01/2024 1353   BUN 16 02/01/2024 1353   CREATININE 1.12 02/01/2024 1353   CALCIUM 9.4 02/01/2024 1353   PROT 6.8 02/01/2024 1353   ALBUMIN 4.0 02/01/2024 1353   AST 13 (L) 02/01/2024 1353   ALT 27 02/01/2024 1353   ALKPHOS 40 02/01/2024 1353   BILITOT 0.6 02/01/2024 1353   GFRNONAA >60 02/01/2024 1353    GFRAA >60 09/15/2015 1520   Lab Results  Component Value Date   WBC 4.8 02/15/2024   NEUTROABS 2.7 02/15/2024   HGB 17.0 02/15/2024   HCT 54.2 (H) 02/15/2024   MCV 85.6 02/15/2024   PLT 226 02/15/2024    Imaging:  CT Head  Wo Contrast Result Date: 01/19/2024 CLINICAL DATA:  Initial evaluation for acute seizure. History of glioblastoma. EXAM: CT HEAD WITHOUT CONTRAST TECHNIQUE: Contiguous axial images were obtained from the base of the skull through the vertex without intravenous contrast. RADIATION DOSE REDUCTION: This exam was performed according to the departmental dose-optimization program which includes automated exposure control, adjustment of the mA and/or kV according to patient size and/or use of iterative reconstruction technique. COMPARISON:  Prior MRI from 01/12/2024. FINDINGS: Brain: Postoperative changes from prior right frontal craniotomy. Patient's known glioma involving the right frontal lobe again seen, measuring approximately 4.0 x 3.4 x 2.1 cm. This measures increased in size from recent MRI, although exact comparison is somewhat difficult given different modalities. Surrounding hypodensity and vasogenic edema appears similar to perhaps slightly worsened. Associated mass effect with 2-3 mm right-to-left shift, worsened from recent MRI. Scattered hyperdensity within the lesion consistent with blood products and/or necrosis, also seen on prior. No other acute intracranial hemorrhage or large vessel territory infarct. No other mass lesion. No hydrocephalus or trapping. No extra-axial fluid collection. Vascular: No asymmetric hyperdense vessel. Skull: Scalp soft tissues demonstrate no acute finding. Prior right frontal craniotomy. Sinuses/Orbits: Globes orbital soft tissues within normal limits. Paranasal sinuses are largely clear. No mastoid effusion. Other: None. IMPRESSION: 1. Patient's known glioma involving the right frontal lobe measures 4.0 x 3.4 x 2.1 cm, increased in size from  recent MRI, although exact comparison is somewhat difficult given different modalities. Surrounding hypodensity and vasogenic edema appears similar to perhaps slightly worsened. Associated mass effect with 2-3 mm right-to-left shift, worsened from recent MRI. 2. No other acute intracranial abnormality. Electronically Signed   By: Rise Mu M.D.   On: 01/19/2024 02:07     Assessment/Plan Frontal glioblastoma (HCC)  Seizure disorder (HCC)  Nicholas Stevens is clinically stable today, now having completed 4 weeks of IMRT and concurrent Temodar.    We ultimately recommended continuing with course of intensity modulated radiation therapy and concurrent daily Temozolomide.  Radiation will be administered Mon-Fri over 6 weeks, Temodar will be dosed at 75mg /m2 to be given daily over 42 days.  We reviewed side effects of temodar, including fatigue, nausea/vomiting, constipation, and cytopenias.  Informed consent was verbally obtained at bedside to proceed with oral chemotherapy.  Chemotherapy should be held for the following:  ANC less than 1,000  Platelets less than 100,000  LFT or creatinine greater than 2x ULN  If clinical concerns/contraindications develop  Every 2 weeks during radiation, labs will be checked accompanied by a clinical evaluation in the brain tumor clinic.  Recommended continuing Keppra 500mg  BID for seizure prevention.  Decadron may remain off if tolerated.  We also discussed and patient consented for additional tumor profiling and sequencing through [facility].  Advanced tumor profiling could help identify actionable mutation for targeted therapy and lead to direct clinical benefit.     All questions were answered. The patient knows to call the clinic with any problems, questions or concerns. No barriers to learning were detected.  The total time spent in the encounter was 30 minutes and more than 50% was on counseling and review of test results   Henreitta Leber, MD Medical Director of Neuro-Oncology [facility] [place] 02/15/24 2:44 PM

## 2024-02-15 NOTE — Progress Notes (Signed)
[facility] Molecular Profiling Requisition faxed to [phone], fax confirmation received.

## 2024-02-16 ENCOUNTER — Other Ambulatory Visit: Payer: Self-pay

## 2024-02-16 ENCOUNTER — Ambulatory Visit
Admission: RE | Admit: 2024-02-16 | Discharge: 2024-02-16 | Disposition: A | Discharge status: Home / Self Care | Payer: Commercial Managed Care - PPO | Source: Ambulatory Visit | Attending: Radiation Oncology | Admitting: Radiation Oncology

## 2024-02-16 DIAGNOSIS — Z510 Encounter for antineoplastic radiation therapy: Secondary | ICD-10-CM | POA: Diagnosis not present

## 2024-02-16 LAB — RAD ONC ARIA SESSION SUMMARY
Course Elapsed Days: 25
Plan Fractions Treated to Date: 20
Plan Prescribed Dose Per Fraction: 2 Gy
Plan Total Fractions Prescribed: 23
Plan Total Prescribed Dose: 46 Gy
Reference Point Dosage Given to Date: 40 Gy
Reference Point Session Dosage Given: 2 Gy
Session Number: 20

## 2024-02-19 ENCOUNTER — Ambulatory Visit
Admission: RE | Admit: 2024-02-19 | Discharge: 2024-02-19 | Disposition: A | Discharge status: Home / Self Care | Payer: Commercial Managed Care - PPO | Source: Ambulatory Visit | Attending: Radiation Oncology | Admitting: Radiation Oncology

## 2024-02-19 ENCOUNTER — Other Ambulatory Visit: Payer: Self-pay

## 2024-02-19 DIAGNOSIS — Z510 Encounter for antineoplastic radiation therapy: Secondary | ICD-10-CM | POA: Diagnosis not present

## 2024-02-19 LAB — RAD ONC ARIA SESSION SUMMARY
Course Elapsed Days: 28
Plan Fractions Treated to Date: 21
Plan Prescribed Dose Per Fraction: 2 Gy
Plan Total Fractions Prescribed: 23
Plan Total Prescribed Dose: 46 Gy
Reference Point Dosage Given to Date: 42 Gy
Reference Point Session Dosage Given: 2 Gy
Session Number: 21

## 2024-02-20 ENCOUNTER — Other Ambulatory Visit: Payer: Self-pay

## 2024-02-20 ENCOUNTER — Ambulatory Visit
Admission: RE | Admit: 2024-02-20 | Discharge: 2024-02-20 | Disposition: A | Discharge status: Home / Self Care | Payer: Commercial Managed Care - PPO | Source: Ambulatory Visit | Attending: Radiation Oncology

## 2024-02-20 ENCOUNTER — Other Ambulatory Visit: Payer: Self-pay | Admitting: Internal Medicine

## 2024-02-20 DIAGNOSIS — C711 Malignant neoplasm of frontal lobe: Secondary | ICD-10-CM

## 2024-02-20 DIAGNOSIS — Z510 Encounter for antineoplastic radiation therapy: Secondary | ICD-10-CM | POA: Diagnosis not present

## 2024-02-20 LAB — RAD ONC ARIA SESSION SUMMARY
Course Elapsed Days: 29
Plan Fractions Treated to Date: 22
Plan Prescribed Dose Per Fraction: 2 Gy
Plan Total Fractions Prescribed: 23
Plan Total Prescribed Dose: 46 Gy
Reference Point Dosage Given to Date: 44 Gy
Reference Point Session Dosage Given: 2 Gy
Session Number: 22

## 2024-02-21 ENCOUNTER — Ambulatory Visit
Admission: RE | Admit: 2024-02-21 | Discharge: 2024-02-21 | Disposition: A | Discharge status: Home / Self Care | Payer: Commercial Managed Care - PPO | Source: Ambulatory Visit | Attending: Radiation Oncology | Admitting: Radiation Oncology

## 2024-02-21 ENCOUNTER — Other Ambulatory Visit: Payer: Self-pay

## 2024-02-21 DIAGNOSIS — Z510 Encounter for antineoplastic radiation therapy: Secondary | ICD-10-CM | POA: Diagnosis not present

## 2024-02-21 LAB — RAD ONC ARIA SESSION SUMMARY
Course Elapsed Days: 30
Plan Fractions Treated to Date: 23
Plan Prescribed Dose Per Fraction: 2 Gy
Plan Total Fractions Prescribed: 23
Plan Total Prescribed Dose: 46 Gy
Reference Point Dosage Given to Date: 46 Gy
Reference Point Session Dosage Given: 2 Gy
Session Number: 23

## 2024-02-22 ENCOUNTER — Ambulatory Visit
Admission: RE | Admit: 2024-02-22 | Discharge: 2024-02-22 | Disposition: A | Discharge status: Home / Self Care | Payer: Commercial Managed Care - PPO | Source: Ambulatory Visit | Attending: Radiation Oncology

## 2024-02-22 ENCOUNTER — Ambulatory Visit: Payer: Commercial Managed Care - PPO | Admitting: Physical Therapy

## 2024-02-22 ENCOUNTER — Ambulatory Visit: Payer: Commercial Managed Care - PPO | Admitting: Occupational Therapy

## 2024-02-22 ENCOUNTER — Other Ambulatory Visit: Payer: Self-pay

## 2024-02-22 DIAGNOSIS — Z510 Encounter for antineoplastic radiation therapy: Secondary | ICD-10-CM | POA: Diagnosis not present

## 2024-02-22 LAB — RAD ONC ARIA SESSION SUMMARY
Course Elapsed Days: 31
Plan Fractions Treated to Date: 1
Plan Prescribed Dose Per Fraction: 2 Gy
Plan Total Fractions Prescribed: 7
Plan Total Prescribed Dose: 14 Gy
Reference Point Dosage Given to Date: 2 Gy
Reference Point Session Dosage Given: 2 Gy
Session Number: 24

## 2024-02-23 ENCOUNTER — Ambulatory Visit
Admission: RE | Admit: 2024-02-23 | Discharge: 2024-02-23 | Disposition: A | Discharge status: Home / Self Care | Payer: Commercial Managed Care - PPO | Source: Ambulatory Visit | Attending: Radiation Oncology | Admitting: Radiation Oncology

## 2024-02-23 ENCOUNTER — Other Ambulatory Visit: Payer: Self-pay

## 2024-02-23 DIAGNOSIS — Z510 Encounter for antineoplastic radiation therapy: Secondary | ICD-10-CM | POA: Diagnosis not present

## 2024-02-23 LAB — RAD ONC ARIA SESSION SUMMARY
Course Elapsed Days: 32
Plan Fractions Treated to Date: 2
Plan Prescribed Dose Per Fraction: 2 Gy
Plan Total Fractions Prescribed: 7
Plan Total Prescribed Dose: 14 Gy
Reference Point Dosage Given to Date: 4 Gy
Reference Point Session Dosage Given: 2 Gy
Session Number: 25

## 2024-02-24 ENCOUNTER — Other Ambulatory Visit: Payer: Self-pay | Admitting: Physical Medicine and Rehabilitation

## 2024-02-24 ENCOUNTER — Other Ambulatory Visit: Payer: Self-pay | Admitting: Internal Medicine

## 2024-02-26 ENCOUNTER — Inpatient Hospital Stay: Payer: Commercial Managed Care - PPO | Admitting: Genetic Counselor

## 2024-02-26 ENCOUNTER — Other Ambulatory Visit (HOSPITAL_COMMUNITY): Payer: Self-pay

## 2024-02-26 ENCOUNTER — Other Ambulatory Visit: Payer: Self-pay | Admitting: Physical Medicine and Rehabilitation

## 2024-02-26 ENCOUNTER — Inpatient Hospital Stay: Payer: Commercial Managed Care - PPO

## 2024-02-26 ENCOUNTER — Other Ambulatory Visit: Payer: Self-pay

## 2024-02-26 ENCOUNTER — Encounter: Payer: Self-pay | Admitting: Internal Medicine

## 2024-02-26 ENCOUNTER — Ambulatory Visit
Admission: RE | Admit: 2024-02-26 | Discharge: 2024-02-26 | Disposition: A | Discharge status: Home / Self Care | Payer: Commercial Managed Care - PPO | Source: Ambulatory Visit | Attending: Radiation Oncology | Admitting: Radiation Oncology

## 2024-02-26 DIAGNOSIS — R262 Difficulty in walking, not elsewhere classified: Secondary | ICD-10-CM | POA: Insufficient documentation

## 2024-02-26 DIAGNOSIS — G40909 Epilepsy, unspecified, not intractable, without status epilepticus: Secondary | ICD-10-CM | POA: Insufficient documentation

## 2024-02-26 DIAGNOSIS — C711 Malignant neoplasm of frontal lobe: Secondary | ICD-10-CM | POA: Insufficient documentation

## 2024-02-26 DIAGNOSIS — Z510 Encounter for antineoplastic radiation therapy: Secondary | ICD-10-CM | POA: Insufficient documentation

## 2024-02-26 DIAGNOSIS — R531 Weakness: Secondary | ICD-10-CM | POA: Insufficient documentation

## 2024-02-26 DIAGNOSIS — Z79899 Other long term (current) drug therapy: Secondary | ICD-10-CM | POA: Insufficient documentation

## 2024-02-26 LAB — CBC WITH DIFFERENTIAL (CANCER CENTER ONLY)
Abs Immature Granulocytes: 0.01 10*3/uL (ref 0.00–0.07)
Basophils Absolute: 0 10*3/uL (ref 0.0–0.1)
Basophils Relative: 1 %
Eosinophils Absolute: 0.1 10*3/uL (ref 0.0–0.5)
Eosinophils Relative: 2 %
HCT: 52.6 % — ABNORMAL HIGH (ref 39.0–52.0)
Hemoglobin: 17 g/dL (ref 13.0–17.0)
Immature Granulocytes: 0 %
Lymphocytes Relative: 24 %
Lymphs Abs: 0.8 10*3/uL (ref 0.7–4.0)
MCH: 27.3 pg (ref 26.0–34.0)
MCHC: 32.3 g/dL (ref 30.0–36.0)
MCV: 84.6 fL (ref 80.0–100.0)
Monocytes Absolute: 0.4 10*3/uL (ref 0.1–1.0)
Monocytes Relative: 11 %
Neutro Abs: 1.9 10*3/uL (ref 1.7–7.7)
Neutrophils Relative %: 62 %
Platelet Count: 203 10*3/uL (ref 150–400)
RBC: 6.22 MIL/uL — ABNORMAL HIGH (ref 4.22–5.81)
RDW: 16.5 % — ABNORMAL HIGH (ref 11.5–15.5)
WBC Count: 3.1 10*3/uL — ABNORMAL LOW (ref 4.0–10.5)
nRBC: 0 % (ref 0.0–0.2)

## 2024-02-26 LAB — CMP (CANCER CENTER ONLY)
ALT: 31 U/L (ref 0–44)
AST: 20 U/L (ref 15–41)
Albumin: 4.4 g/dL (ref 3.5–5.0)
Alkaline Phosphatase: 35 U/L — ABNORMAL LOW (ref 38–126)
Anion gap: 3 — ABNORMAL LOW (ref 5–15)
BUN: 9 mg/dL (ref 6–20)
CO2: 32 mmol/L (ref 22–32)
Calcium: 9.8 mg/dL (ref 8.9–10.3)
Chloride: 103 mmol/L (ref 98–111)
Creatinine: 1.12 mg/dL (ref 0.61–1.24)
GFR, Estimated: >60 mL/min (ref >60)
Glucose, Bld: 79 mg/dL (ref 70–99)
Potassium: 4.1 mmol/L (ref 3.5–5.1)
Sodium: 138 mmol/L (ref 135–145)
Total Bilirubin: 0.8 mg/dL (ref 0.0–1.2)
Total Protein: 7.2 g/dL (ref 6.5–8.1)

## 2024-02-26 LAB — RAD ONC ARIA SESSION SUMMARY
Course Elapsed Days: 35
Plan Fractions Treated to Date: 3
Plan Prescribed Dose Per Fraction: 2 Gy
Plan Total Fractions Prescribed: 7
Plan Total Prescribed Dose: 14 Gy
Reference Point Dosage Given to Date: 6 Gy
Reference Point Session Dosage Given: 2 Gy
Session Number: 26

## 2024-02-26 MED ORDER — APIXABAN 5 MG PO TABS
5.0000 mg | ORAL_TABLET | Freq: Two times a day (BID) | ORAL | 0 refills | Status: DC
  Filled 2024-02-26: qty 60, 30d supply, fill #0

## 2024-02-27 ENCOUNTER — Other Ambulatory Visit: Payer: Self-pay

## 2024-02-27 ENCOUNTER — Encounter (HOSPITAL_COMMUNITY): Payer: Self-pay

## 2024-02-27 ENCOUNTER — Telehealth: Payer: Self-pay | Admitting: Genetic Counselor

## 2024-02-27 ENCOUNTER — Ambulatory Visit
Admission: RE | Admit: 2024-02-27 | Discharge: 2024-02-27 | Disposition: A | Discharge status: Home / Self Care | Payer: Commercial Managed Care - PPO | Source: Ambulatory Visit | Attending: Radiation Oncology

## 2024-02-27 ENCOUNTER — Encounter: Payer: Self-pay | Admitting: Internal Medicine

## 2024-02-27 DIAGNOSIS — Z510 Encounter for antineoplastic radiation therapy: Secondary | ICD-10-CM | POA: Diagnosis not present

## 2024-02-27 LAB — RAD ONC ARIA SESSION SUMMARY
Course Elapsed Days: 36
Plan Fractions Treated to Date: 4
Plan Prescribed Dose Per Fraction: 2 Gy
Plan Total Fractions Prescribed: 7
Plan Total Prescribed Dose: 14 Gy
Reference Point Dosage Given to Date: 8 Gy
Reference Point Session Dosage Given: 2 Gy
Session Number: 27

## 2024-02-27 NOTE — Telephone Encounter (Signed)
 The patient left a voicemail stating they wanted to be rescheduled for their missed genetic counseling appointment. I left the patient a voicemail asking them to call us back to reschedule.

## 2024-02-28 ENCOUNTER — Ambulatory Visit
Admission: RE | Admit: 2024-02-28 | Discharge: 2024-02-28 | Disposition: A | Discharge status: Home / Self Care | Payer: Commercial Managed Care - PPO | Source: Ambulatory Visit | Attending: Radiation Oncology

## 2024-02-28 ENCOUNTER — Other Ambulatory Visit: Payer: Self-pay

## 2024-02-28 DIAGNOSIS — Z510 Encounter for antineoplastic radiation therapy: Secondary | ICD-10-CM | POA: Diagnosis not present

## 2024-02-28 LAB — RAD ONC ARIA SESSION SUMMARY
Course Elapsed Days: 37
Plan Fractions Treated to Date: 5
Plan Prescribed Dose Per Fraction: 2 Gy
Plan Total Fractions Prescribed: 7
Plan Total Prescribed Dose: 14 Gy
Reference Point Dosage Given to Date: 10 Gy
Reference Point Session Dosage Given: 2 Gy
Session Number: 28

## 2024-02-29 ENCOUNTER — Ambulatory Visit
Admission: RE | Admit: 2024-02-29 | Discharge: 2024-02-29 | Disposition: A | Discharge status: Home / Self Care | Payer: Commercial Managed Care - PPO | Source: Ambulatory Visit | Attending: Radiation Oncology | Admitting: Radiation Oncology

## 2024-02-29 ENCOUNTER — Ambulatory Visit: Payer: Commercial Managed Care - PPO | Admitting: Occupational Therapy

## 2024-02-29 ENCOUNTER — Inpatient Hospital Stay: Payer: Commercial Managed Care - PPO

## 2024-02-29 ENCOUNTER — Other Ambulatory Visit: Payer: Self-pay

## 2024-02-29 ENCOUNTER — Inpatient Hospital Stay: Payer: Commercial Managed Care - PPO | Admitting: Internal Medicine

## 2024-02-29 ENCOUNTER — Ambulatory Visit: Payer: Commercial Managed Care - PPO | Admitting: Physical Therapy

## 2024-02-29 VITALS — BP 139/86 | HR 59 | Temp 98.3°F | Resp 13 | Wt 283.1 lb

## 2024-02-29 DIAGNOSIS — C711 Malignant neoplasm of frontal lobe: Secondary | ICD-10-CM | POA: Diagnosis not present

## 2024-02-29 DIAGNOSIS — Z510 Encounter for antineoplastic radiation therapy: Secondary | ICD-10-CM | POA: Diagnosis not present

## 2024-02-29 DIAGNOSIS — R569 Unspecified convulsions: Secondary | ICD-10-CM | POA: Diagnosis not present

## 2024-02-29 LAB — RAD ONC ARIA SESSION SUMMARY
Course Elapsed Days: 38
Plan Fractions Treated to Date: 6
Plan Prescribed Dose Per Fraction: 2 Gy
Plan Total Fractions Prescribed: 7
Plan Total Prescribed Dose: 14 Gy
Reference Point Dosage Given to Date: 12 Gy
Reference Point Session Dosage Given: 2 Gy
Session Number: 29

## 2024-02-29 NOTE — Progress Notes (Signed)
 [facility] at [facility] [address]  [place], [place] [address] [phone]   Interval Evaluation  Date of Service: 02/29/24 Patient Name: Nicholas Stevens Patient MRN: [id number] Patient DOB: September 25, 1971 Provider: Henreitta Leber, MD  Identifying Statement:  Nicholas Stevens is a 53 y.o. male with right frontal glioblastoma    Oncologic History: Oncology History  Frontal glioblastoma (HCC)  12/18/2023 Surgery   Craniotomy, resection of right frontal mass with Dr. Maisie Fus; path is glioblastoma, IDH pending   01/22/2024 -  Chemotherapy   Patient is on Treatment Plan : BRAIN GLIOBLASTOMA Radiation Therapy With Concurrent Temozolomide 75 mg/m2 Daily Followed By Sequential Maintenance Temozolomide x 6-12 cycles       Biomarkers:  MGMT Unknown.  IDH 1/2 Unknown.  EGFR Unknown  TERT Unknown   Interval History: Nicholas Stevens presents for follow up now in final 6 week of radiation and Temodar.  Napping 2 hours per day with the fatigue worsening.  He continues on Keppra, but has weaned off decadron fully.  Tolerating RT and chemo well overall otherwise.  Denies severe headaches.   H+P (01/08/24) Patient presented with several days of left sided clumsiness, impaired coordination.  Was found to have large right frontal mass, c/w primary brain tumor.  He underwent craniotomy, resection with Dr. Maisie Fus on 12/18/23; path demonstrated glioblastoma.  Following surgery, he had persistent right sided weakness, he is currently in outpatient PT following discharge from rehab.  No seizures, headaches.     Medications: Current Outpatient Medications on File Prior to Visit  Medication Sig Dispense Refill   apixaban (ELIQUIS) 5 MG TABS tablet Take 1 tablet (5 mg total) by mouth 2 (two) times daily. 60 tablet 0   camphor-menthol (SARNA) lotion Apply topically as needed for itching. 222 mL 0   Cyanocobalamin (B-12 PO) Take 1 tablet by mouth daily.      latanoprost (XALATAN) 0.005 % ophthalmic solution Place 1 drop into both eyes at bedtime.     levETIRAcetam (KEPPRA) 500 MG tablet Take 1 tablet (500 mg total) by mouth 2 (two) times daily. 60 tablet 0   Multiple Vitamin (MULTIVITAMIN WITH MINERALS) TABS tablet Take 1 tablet by mouth daily.     Omega-3 Fatty Acids (FISH OIL) 1000 MG CAPS Take 1,000 mg by mouth daily.     ondansetron (ZOFRAN) 8 MG tablet Take 1 tablet (8 mg total) by mouth every 8 (eight) hours as needed for nausea or vomiting. May take 30-60 minutes prior to Temodar administration if nausea/vomiting occurs as needed. 30 tablet 1   pantoprazole (PROTONIX) 40 MG tablet Take 1 tablet (40 mg total) by mouth daily. 90 tablet 3   pimecrolimus (ELIDEL) 1 % cream Apply 1 Application topically 2 (two) times daily as needed (Dermatitis).     senna-docusate (SENOKOT-S) 8.6-50 MG tablet Take 2 tablets by mouth daily at 6 (six) AM. 60 tablet 0   temozolomide (TEMODAR) 180 MG capsule Take 1 capsule (180 mg total) by mouth daily. May take on an empty stomach to decrease nausea & vomiting. 42 capsule 0   triamcinolone cream (KENALOG) 0.1 % Apply 1 Application topically daily. Use on affected areas for 2 weeks, then stop for 2 weeks. Repeat until clear if needed. 453 g 0   zolpidem (AMBIEN) 10 MG tablet TAKE 1 TABLET BY MOUTH EVERYDAY AT BEDTIME 90 tablet 1   No current facility-administered medications on file prior to visit.    Allergies: No Known Allergies  Past Medical History:  Past Medical History:  Diagnosis Date   DDD (degenerative disc disease), lumbar    HNP (herniated nucleus pulposus)    Hypertension    Liver hemangioma    Morbid obesity (HCC)    OSA (obstructive sleep apnea)    no cpap used since weight loss   Overweight(278.02)    Plantar fasciitis of right foot    Sleep apnea    Tinea barbae    Past Surgical History:  Past Surgical History:  Procedure Laterality Date   BREATH TEK H PYLORI N/A 08/20/2013   Procedure:  BREATH TEK H PYLORI;  Surgeon: Valarie Merino, MD;  Location: [facility];  Service: General;  Laterality: N/A;   COLONOSCOPY WITH PROPOFOL N/A 05/05/2022   Procedure: COLONOSCOPY WITH PROPOFOL;  Surgeon: Beverley Fiedler, MD;  Location: [facility];  Service: Gastroenterology;  Laterality: N/A;   CRANIOTOMY Right 12/18/2023   Procedure: Right Frontal Stereotactic Craniotomy for Resection of Tumor;  Surgeon: Bedelia Person, MD;  Location: [facility] OR;  Service: Neurosurgery;  Laterality: Right;   ESOPHAGOGASTRODUODENOSCOPY (EGD) WITH PROPOFOL N/A 03/15/2023   Procedure: ESOPHAGOGASTRODUODENOSCOPY (EGD) WITH PROPOFOL;  Surgeon: Iva Boop, MD;  Location: [facility];  Service: Gastroenterology;  Laterality: N/A;   LAPAROSCOPIC APPENDECTOMY  2007   Dr Carolynne Edouard   LAPAROSCOPIC GASTRIC SLEEVE RESECTION N/A 10/08/2013   Procedure: LAPAROSCOPIC SLEEVE GASTRECTOMY;  Surgeon: Valarie Merino, MD;  Location: [facility];  Service: General;  Laterality: N/A;   LUMBAR LAMINECTOMY/DECOMPRESSION MICRODISCECTOMY N/A 09/16/2015   Procedure: MICRO LUMBAR DECOMPRESSION, MICRODISCECTOMY L5 - S1;  Surgeon: Jene Every, MD;  Location: [facility];  Service: Orthopedics;  Laterality: N/A;   POLYPECTOMY  05/05/2022   Procedure: POLYPECTOMY;  Surgeon: Beverley Fiedler, MD;  Location: [facility];  Service: Gastroenterology;;   ROTATOR CUFF REPAIR Right 2005   Dr Lajoyce Corners   Social History:  Social History   Socioeconomic History   Marital status: Married    Spouse name: Nieves Barberi   Number of children: 3   Years of education: Not on file   Highest education level: 12th grade  Occupational History   Occupation: Doctor, general practice PD    Employer: UNEMPLOYED  Tobacco Use   Smoking status: Never    Passive exposure: Never   Smokeless tobacco: Never  Vaping Use   Vaping status: Never Used  Substance and Sexual Activity   Alcohol use: No   Drug use: No   Sexual activity: Not on file  Other Topics Concern   Not on file  Social  History Narrative   Married 18+ years   3 children - ages approx 51, 9 and 5...daughter age 83 w/ DM type 1   Social Drivers of Corporate investment banker Strain: Low Risk  (04/02/2023)   Overall Financial Resource Strain (CARDIA)    Difficulty of Paying Living Expenses: Not hard at all  Food Insecurity: No Food Insecurity (12/18/2023)   Hunger Vital Sign    Worried About Running Out of Food in the Last Year: Never true    Ran Out of Food in the Last Year: Never true  Transportation Needs: No Transportation Needs (12/18/2023)   PRAPARE - Administrator, Civil Service (Medical): No    Lack of Transportation (Non-Medical): No  Physical Activity: Insufficiently Active (04/02/2023)   Exercise Vital Sign    Days of Exercise per Week: 4 days    Minutes of Exercise per Session: 30 min  Stress: No Stress Concern  Present (04/02/2023)   [employer] of Occupational Health - Occupational Stress Questionnaire    Feeling of Stress : Not at all  Social Connections: Socially Integrated (04/02/2023)   Social Connection and Isolation Panel [NHANES]    Frequency of Communication with Friends and Family: More than three times a week    Frequency of Social Gatherings with Friends and Family: Once a week    Attends Religious Services: More than 4 times per year    Active Member of [employer] or Organizations: Yes    Attends Engineer, structural: More than 4 times per year    Marital Status: Married  Catering manager Violence: Not At Risk (12/18/2023)   Humiliation, Afraid, Rape, and Kick questionnaire    Fear of Current or Ex-Partner: No    Emotionally Abused: No    Physically Abused: No    Sexually Abused: No   Family History:  Family History  Problem Relation Age of Onset   Other Mother        hx of DJD   Liver disease Father        ETOH, Hep C   Hypertension Father    Colon cancer Neg Hx    Colon polyps Neg Hx    Esophageal cancer Neg Hx    Rectal cancer Neg Hx    Stomach  cancer Neg Hx     Review of Systems: Constitutional: Doesn't report fevers, chills or abnormal weight loss Eyes: Doesn't report blurriness of vision Ears, nose, mouth, throat, and face: Doesn't report sore throat Respiratory: Doesn't report cough, dyspnea or wheezes Cardiovascular: Doesn't report palpitation, chest discomfort  Gastrointestinal:  Doesn't report nausea, constipation, diarrhea GU: Doesn't report incontinence Skin: Doesn't report skin rashes Neurological: Per HPI Musculoskeletal: Doesn't report joint pain Behavioral/Psych: Doesn't report anxiety  Physical Exam: Vitals:   02/29/24 1519  BP: 139/86  Pulse: (!) 59  Resp: 13  Temp: 98.3 F (36.8 C)  SpO2: 99%    KPS: 80. General: Alert, cooperative, pleasant, in no acute distress Head: Normal EENT: No conjunctival injection or scleral icterus.  Lungs: Resp effort normal Cardiac: Regular rate Abdomen: Non-distended abdomen Skin: No rashes cyanosis or petechiae. Extremities: No clubbing or edema  Neurologic Exam: Mental Status: Awake, alert, attentive to examiner. Oriented to self and environment. Language is fluent with intact comprehension.  Cranial Nerves: Visual acuity is grossly normal. Visual fields are full. Extra-ocular movements intact. No ptosis. Face is symmetric Motor: Tone and bulk are normal. Power is 4+/5 in left arm and leg. Reflexes are symmetric, no pathologic reflexes present.  Sensory: Intact to light touch Gait: Normal.   Labs: I have reviewed the data as listed    Component Value Date/Time   NA 138 02/26/2024 1415   K 4.1 02/26/2024 1415   CL 103 02/26/2024 1415   CO2 32 02/26/2024 1415   GLUCOSE 79 02/26/2024 1415   BUN 9 02/26/2024 1415   CREATININE 1.12 02/26/2024 1415   CALCIUM 9.8 02/26/2024 1415   PROT 7.2 02/26/2024 1415   ALBUMIN 4.4 02/26/2024 1415   AST 20 02/26/2024 1415   ALT 31 02/26/2024 1415   ALKPHOS 35 (L) 02/26/2024 1415   BILITOT 0.8 02/26/2024 1415    GFRNONAA >60 02/26/2024 1415   GFRAA >60 09/15/2015 1520   Lab Results  Component Value Date   WBC 3.1 (L) 02/26/2024   NEUTROABS 1.9 02/26/2024   HGB 17.0 02/26/2024   HCT 52.6 (H) 02/26/2024   MCV 84.6 02/26/2024  PLT 203 02/26/2024     Assessment/Plan Frontal glioblastoma (HCC)  Focal seizure (HCC)  Nicholas Stevens is clinically stable today, now having completed 6 weeks of IMRT and concurrent Temodar.  Labs remain within normal limits.  We ultimately recommended completing course of intensity modulated radiation therapy and concurrent daily Temozolomide.  Radiation will be administered Mon-Fri over 6 weeks, Temodar will be dosed at 75mg /m2 to be given daily over 42 days.  We reviewed side effects of temodar, including fatigue, nausea/vomiting, constipation, and cytopenias.  Informed consent was verbally obtained at bedside to proceed with oral chemotherapy.  Chemotherapy should be held for the following:  ANC less than 1,000  Platelets less than 100,000  LFT or creatinine greater than 2x ULN  If clinical concerns/contraindications develop  Every 2 weeks during radiation, labs will be checked accompanied by a clinical evaluation in the brain tumor clinic.  Recommended continuing Keppra 500mg  BID for seizure prevention.  Decadron may remain off if tolerated.  We ask that [employer] return to clinic in 1 months following post-RT brain MRI, or sooner as needed.  All questions were answered. The patient knows to call the clinic with any problems, questions or concerns. No barriers to learning were detected.  The total time spent in the encounter was 30 minutes and more than 50% was on counseling and review of test results   Henreitta Leber, MD Medical Director of Neuro-Oncology [facility] 02/29/24 3:05 PM

## 2024-03-01 ENCOUNTER — Telehealth: Payer: Self-pay | Admitting: Internal Medicine

## 2024-03-01 ENCOUNTER — Ambulatory Visit
Admission: RE | Admit: 2024-03-01 | Discharge: 2024-03-01 | Disposition: A | Discharge status: Home / Self Care | Source: Ambulatory Visit | Attending: Radiation Oncology | Admitting: Radiation Oncology

## 2024-03-01 ENCOUNTER — Ambulatory Visit
Admission: RE | Admit: 2024-03-01 | Discharge: 2024-03-01 | Disposition: A | Discharge status: Home / Self Care | Payer: Commercial Managed Care - PPO | Source: Ambulatory Visit | Attending: Radiation Oncology | Admitting: Radiation Oncology

## 2024-03-01 ENCOUNTER — Other Ambulatory Visit: Payer: Self-pay

## 2024-03-01 DIAGNOSIS — Z510 Encounter for antineoplastic radiation therapy: Secondary | ICD-10-CM | POA: Diagnosis not present

## 2024-03-01 LAB — RAD ONC ARIA SESSION SUMMARY
Course Elapsed Days: 39
Plan Fractions Treated to Date: 7
Plan Prescribed Dose Per Fraction: 2 Gy
Plan Total Fractions Prescribed: 7
Plan Total Prescribed Dose: 14 Gy
Reference Point Dosage Given to Date: 14 Gy
Reference Point Session Dosage Given: 2 Gy
Session Number: 30

## 2024-03-01 NOTE — Telephone Encounter (Signed)
 Patient Scheduled appts. Patient is aware of all appt details.

## 2024-03-04 ENCOUNTER — Telehealth: Payer: Self-pay | Admitting: Genetic Counselor

## 2024-03-04 NOTE — Radiation Completion Notes (Signed)
  Radiation Oncology         [phone] ________________________________  Name: Nicholas Stevens MRN: [id number]  Date of Service: 03/01/2024  DOB: Apr 09, 1971  End of Treatment Note     Diagnosis: Glioblastoma of the right frontal lobe   Intent: Curative     ==========DELIVERED PLANS==========  First Treatment Date: 2024-01-22 Last Treatment Date: 2024-03-01   Plan Name: Brain_R Site: Brain Technique: IMRT Mode: Photon Dose Per Fraction: 2 Gy Prescribed Dose (Delivered / Prescribed): 46 Gy / 46 Gy Prescribed Fxs (Delivered / Prescribed): 23 / 23   Plan Name: Brain_R_Bst Site: Brain Technique: IMRT Mode: Photon Dose Per Fraction: 2 Gy Prescribed Dose (Delivered / Prescribed): 14 Gy / 14 Gy Prescribed Fxs (Delivered / Prescribed): 7 / 7     ==========ON TREATMENT VISIT DATES========== 2024-01-26, 2024-02-02, 2024-02-08, 2024-02-16, 2024-02-23, 2024-03-01    See weekly On Treatment Notes in Epic for details in the Media tab (listed as Progress notes on the On Treatment Visit Dates listed above). The patient tolerated radiation. He developed fatigue and anticipated skin changes in the treatment field.   The patient will receive a call in about one month from the radiation oncology department. He will continue follow up with Dr. Barbaraann Cao as well.      Osker Mason, PAC

## 2024-03-06 ENCOUNTER — Other Ambulatory Visit: Payer: Self-pay

## 2024-03-06 ENCOUNTER — Encounter: Payer: Self-pay | Admitting: Internal Medicine

## 2024-03-06 MED ORDER — DEXAMETHASONE 4 MG PO TABS: 4.0000 mg | ORAL_TABLET | Freq: Two times a day (BID) | ORAL | 1 refills | Status: DC

## 2024-03-07 ENCOUNTER — Telehealth: Payer: Self-pay | Admitting: Internal Medicine

## 2024-03-07 NOTE — Telephone Encounter (Signed)
 Patient is aware of all appt details.

## 2024-03-09 ENCOUNTER — Encounter: Payer: Self-pay | Admitting: Internal Medicine

## 2024-03-11 ENCOUNTER — Encounter: Payer: Self-pay | Admitting: Internal Medicine

## 2024-03-11 MED ORDER — LEVETIRACETAM 500 MG PO TABS: 500.0000 mg | ORAL_TABLET | Freq: Two times a day (BID) | ORAL | 3 refills | Status: DC

## 2024-03-12 ENCOUNTER — Inpatient Hospital Stay (HOSPITAL_BASED_OUTPATIENT_CLINIC_OR_DEPARTMENT_OTHER): Admitting: Internal Medicine

## 2024-03-12 VITALS — BP 129/86 | HR 91 | Temp 98.3°F | Resp 17 | Wt 282.2 lb

## 2024-03-12 DIAGNOSIS — G40909 Epilepsy, unspecified, not intractable, without status epilepticus: Secondary | ICD-10-CM

## 2024-03-12 DIAGNOSIS — C711 Malignant neoplasm of frontal lobe: Secondary | ICD-10-CM

## 2024-03-12 DIAGNOSIS — Z510 Encounter for antineoplastic radiation therapy: Secondary | ICD-10-CM | POA: Diagnosis not present

## 2024-03-12 NOTE — Progress Notes (Signed)
 [facility] [address]  [place], [place] [address] [phone]   Interval Evaluation  Date of Service: 03/12/24 Patient Name: Nicholas Stevens Patient MRN: [id number] Patient DOB: November 09, 1971 Provider: Henreitta Leber, MD  Identifying Statement:  JAHBARI REPINSKI is a 53 y.o. male with right frontal glioblastoma    Oncologic History: Oncology History  Frontal glioblastoma (HCC)  12/18/2023 Surgery   Craniotomy, resection of right frontal mass with Dr. Maisie Fus; path is glioblastoma, IDH pending   01/22/2024 -  Chemotherapy   Patient is on Treatment Plan : BRAIN GLIOBLASTOMA Radiation Therapy With Concurrent Temozolomide 75 mg/m2 Daily Followed By Sequential Maintenance Temozolomide x 6-12 cycles       Biomarkers:  MGMT Unknown.  IDH 1/2 Unknown.  EGFR Unknown  TERT Unknown   Interval History: Dorance Spink Benegas presents for follow up after recent clinical changes.  He describes relatively rapid onset of left sided weakness starting ~10 days ago.  He was unable to walk and use the left arm meaningfully.  No seizure activity appreciated.  Decadron was started 4mg  twice per day after 3-4 days of the weakness.  Once the steroid was started, he returned back to his prior baseline.  Continues on the Keppra.  Denies severe headaches.   H+P (01/08/24) Patient presented with several days of left sided clumsiness, impaired coordination.  Was found to have large right frontal mass, c/w primary brain tumor.  He underwent craniotomy, resection with Dr. Maisie Fus on 12/18/23; path demonstrated glioblastoma.  Following surgery, he had persistent right sided weakness, he is currently in outpatient PT following discharge from rehab.  No seizures, headaches.     Medications: Current Outpatient Medications on File Prior to Visit  Medication Sig Dispense Refill   apixaban (ELIQUIS) 5 MG TABS tablet Take 1 tablet (5 mg total) by mouth 2 (two) times  daily. 60 tablet 0   camphor-menthol (SARNA) lotion Apply topically as needed for itching. 222 mL 0   Cyanocobalamin (B-12 PO) Take 1 tablet by mouth daily.     dexamethasone (DECADRON) 4 MG tablet Take 1 tablet (4 mg total) by mouth 2 (two) times daily with a meal. 60 tablet 1   latanoprost (XALATAN) 0.005 % ophthalmic solution Place 1 drop into both eyes at bedtime.     levETIRAcetam (KEPPRA) 500 MG tablet Take 1 tablet (500 mg total) by mouth 2 (two) times daily. 60 tablet 3   Multiple Vitamin (MULTIVITAMIN WITH MINERALS) TABS tablet Take 1 tablet by mouth daily.     Omega-3 Fatty Acids (FISH OIL) 1000 MG CAPS Take 1,000 mg by mouth daily.     ondansetron (ZOFRAN) 8 MG tablet Take 1 tablet (8 mg total) by mouth every 8 (eight) hours as needed for nausea or vomiting. May take 30-60 minutes prior to Temodar administration if nausea/vomiting occurs as needed. 30 tablet 1   pantoprazole (PROTONIX) 40 MG tablet Take 1 tablet (40 mg total) by mouth daily. 90 tablet 3   pimecrolimus (ELIDEL) 1 % cream Apply 1 Application topically 2 (two) times daily as needed (Dermatitis).     senna-docusate (SENOKOT-S) 8.6-50 MG tablet Take 2 tablets by mouth daily at 6 (six) AM. 60 tablet 0   triamcinolone cream (KENALOG) 0.1 % Apply 1 Application topically daily. Use on affected areas for 2 weeks, then stop for 2 weeks. Repeat until clear if needed. 453 g 0   zolpidem (AMBIEN) 10 MG tablet TAKE 1 TABLET BY MOUTH  EVERYDAY AT BEDTIME 90 tablet 1   temozolomide (TEMODAR) 180 MG capsule Take 1 capsule (180 mg total) by mouth daily. May take on an empty stomach to decrease nausea & vomiting. (Patient not taking: Reported on 03/12/2024) 42 capsule 0   No current facility-administered medications on file prior to visit.    Allergies: No Known Allergies Past Medical History:  Past Medical History:  Diagnosis Date   DDD (degenerative disc disease), lumbar    HNP (herniated nucleus pulposus)    Hypertension    Liver  hemangioma    Morbid obesity (HCC)    OSA (obstructive sleep apnea)    no cpap used since weight loss   Overweight(278.02)    Plantar fasciitis of right foot    Sleep apnea    Tinea barbae    Past Surgical History:  Past Surgical History:  Procedure Laterality Date   BREATH TEK H PYLORI N/A 08/20/2013   Procedure: BREATH TEK H PYLORI;  Surgeon: Valarie Merino, MD;  Location: [facility];  Service: General;  Laterality: N/A;   COLONOSCOPY WITH PROPOFOL N/A 05/05/2022   Procedure: COLONOSCOPY WITH PROPOFOL;  Surgeon: Beverley Fiedler, MD;  Location: [facility];  Service: Gastroenterology;  Laterality: N/A;   CRANIOTOMY Right 12/18/2023   Procedure: Right Frontal Stereotactic Craniotomy for Resection of Tumor;  Surgeon: Bedelia Person, MD;  Location: [facility] OR;  Service: Neurosurgery;  Laterality: Right;   ESOPHAGOGASTRODUODENOSCOPY (EGD) WITH PROPOFOL N/A 03/15/2023   Procedure: ESOPHAGOGASTRODUODENOSCOPY (EGD) WITH PROPOFOL;  Surgeon: Iva Boop, MD;  Location: [facility];  Service: Gastroenterology;  Laterality: N/A;   LAPAROSCOPIC APPENDECTOMY  2007   Dr Carolynne Edouard   LAPAROSCOPIC GASTRIC SLEEVE RESECTION N/A 10/08/2013   Procedure: LAPAROSCOPIC SLEEVE GASTRECTOMY;  Surgeon: Valarie Merino, MD;  Location: [facility];  Service: General;  Laterality: N/A;   LUMBAR LAMINECTOMY/DECOMPRESSION MICRODISCECTOMY N/A 09/16/2015   Procedure: MICRO LUMBAR DECOMPRESSION, MICRODISCECTOMY L5 - S1;  Surgeon: Jene Every, MD;  Location: [facility];  Service: Orthopedics;  Laterality: N/A;   POLYPECTOMY  05/05/2022   Procedure: POLYPECTOMY;  Surgeon: Beverley Fiedler, MD;  Location: [facility];  Service: Gastroenterology;;   ROTATOR CUFF REPAIR Right 2005   Dr Lajoyce Corners   Social History:  Social History   Socioeconomic History   Marital status: Married    Spouse name: Cashel Bellina   Number of children: 3   Years of education: Not on file   Highest education level: 12th grade  Occupational History    Occupation: Doctor, general practice PD    Employer: UNEMPLOYED  Tobacco Use   Smoking status: Never    Passive exposure: Never   Smokeless tobacco: Never  Vaping Use   Vaping status: Never Used  Substance and Sexual Activity   Alcohol use: No   Drug use: No   Sexual activity: Not on file  Other Topics Concern   Not on file  Social History Narrative   Married 18+ years   3 children - ages approx 87, 9 and 5...daughter age 66 w/ DM type 1   Social Drivers of Corporate investment banker Strain: Low Risk  (04/02/2023)   Overall Financial Resource Strain (CARDIA)    Difficulty of Paying Living Expenses: Not hard at all  Food Insecurity: No Food Insecurity (12/18/2023)   Hunger Vital Sign    Worried About Running Out of Food in the Last Year: Never true    Ran Out of Food in the Last Year: Never true  Transportation Needs: No  Transportation Needs (12/18/2023)   PRAPARE - Administrator, Civil Service (Medical): No    Lack of Transportation (Non-Medical): No  Physical Activity: Insufficiently Active (04/02/2023)   Exercise Vital Sign    Days of Exercise per Week: 4 days    Minutes of Exercise per Session: 30 min  Stress: No Stress Concern Present (04/02/2023)   [employer] of Occupational Health - Occupational Stress Questionnaire    Feeling of Stress : Not at all  Social Connections: Socially Integrated (04/02/2023)   Social Connection and Isolation Panel [NHANES]    Frequency of Communication with Friends and Family: More than three times a week    Frequency of Social Gatherings with Friends and Family: Once a week    Attends Religious Services: More than 4 times per year    Active Member of [employer] or Organizations: Yes    Attends Engineer, structural: More than 4 times per year    Marital Status: Married  Catering manager Violence: Not At Risk (12/18/2023)   Humiliation, Afraid, Rape, and Kick questionnaire    Fear of Current or Ex-Partner: No    Emotionally Abused: No     Physically Abused: No    Sexually Abused: No   Family History:  Family History  Problem Relation Age of Onset   Other Mother        hx of DJD   Liver disease Father        ETOH, Hep C   Hypertension Father    Colon cancer Neg Hx    Colon polyps Neg Hx    Esophageal cancer Neg Hx    Rectal cancer Neg Hx    Stomach cancer Neg Hx     Review of Systems: Constitutional: Doesn't report fevers, chills or abnormal weight loss Eyes: Doesn't report blurriness of vision Ears, nose, mouth, throat, and face: Doesn't report sore throat Respiratory: Doesn't report cough, dyspnea or wheezes Cardiovascular: Doesn't report palpitation, chest discomfort  Gastrointestinal:  Doesn't report nausea, constipation, diarrhea GU: Doesn't report incontinence Skin: Doesn't report skin rashes Neurological: Per HPI Musculoskeletal: Doesn't report joint pain Behavioral/Psych: Doesn't report anxiety  Physical Exam: Vitals:   03/12/24 1053 03/12/24 1054  BP: (!) 147/93 129/86  Pulse: 91   Resp: 17   Temp: 98.3 F (36.8 C)   SpO2: 96%     KPS: 80. General: Alert, cooperative, pleasant, in no acute distress Head: Normal EENT: No conjunctival injection or scleral icterus.  Lungs: Resp effort normal Cardiac: Regular rate Abdomen: Non-distended abdomen Skin: No rashes cyanosis or petechiae. Extremities: No clubbing or edema  Neurologic Exam: Mental Status: Awake, alert, attentive to examiner. Oriented to self and environment. Language is fluent with intact comprehension.  Cranial Nerves: Visual acuity is grossly normal. Visual fields are full. Extra-ocular movements intact. No ptosis. Face is symmetric Motor: Tone and bulk are normal. Power is 4+/5 in left arm and leg. Reflexes are symmetric, no pathologic reflexes present.  Sensory: Intact to light touch Gait: Normal.   Labs: I have reviewed the data as listed    Component Value Date/Time   NA 138 02/26/2024 1415   K 4.1 02/26/2024 1415    CL 103 02/26/2024 1415   CO2 32 02/26/2024 1415   GLUCOSE 79 02/26/2024 1415   BUN 9 02/26/2024 1415   CREATININE 1.12 02/26/2024 1415   CALCIUM 9.8 02/26/2024 1415   PROT 7.2 02/26/2024 1415   ALBUMIN 4.4 02/26/2024 1415   AST 20 02/26/2024 1415  ALT 31 02/26/2024 1415   ALKPHOS 35 (L) 02/26/2024 1415   BILITOT 0.8 02/26/2024 1415   GFRNONAA >60 02/26/2024 1415   GFRAA >60 09/15/2015 1520   Lab Results  Component Value Date   WBC 3.1 (L) 02/26/2024   NEUTROABS 1.9 02/26/2024   HGB 17.0 02/26/2024   HCT 52.6 (H) 02/26/2024   MCV 84.6 02/26/2024   PLT 203 02/26/2024     Assessment/Plan Frontal glioblastoma (HCC)  Seizure disorder (HCC)  Mariana W Hagy is clinically improved today, after focal decompensation last week.  Based on duration and character of symptoms, response to steroids, this was likely secondary to post-RT inflammatory syndrome.  Recommended decreasing decadron to 4mg /2mg  x3 days, then 4mg  daily thereafter.  He is agreeable with this.  Recommended continuing Keppra 500mg  BID for seizure prevention.  We ask that [employer] return to clinic in 2 weeks following brain MRI, or sooner if needed.  All questions were answered. The patient knows to call the clinic with any problems, questions or concerns. No barriers to learning were detected.  The total time spent in the encounter was 30 minutes and more than 50% was on counseling and review of test results   Henreitta Leber, MD Medical Director of Neuro-Oncology [facility] 03/12/24 10:59 AM

## 2024-03-13 ENCOUNTER — Encounter: Payer: Self-pay | Admitting: Internal Medicine

## 2024-03-14 ENCOUNTER — Encounter: Payer: Self-pay | Admitting: Internal Medicine

## 2024-03-15 ENCOUNTER — Other Ambulatory Visit (HOSPITAL_COMMUNITY): Payer: Self-pay

## 2024-03-15 ENCOUNTER — Encounter (HOSPITAL_COMMUNITY): Payer: Self-pay

## 2024-03-15 ENCOUNTER — Other Ambulatory Visit: Payer: Self-pay

## 2024-03-15 ENCOUNTER — Ambulatory Visit (HOSPITAL_COMMUNITY)
Admission: RE | Admit: 2024-03-15 | Discharge: 2024-03-15 | Disposition: A | Discharge status: Home / Self Care | Source: Ambulatory Visit | Attending: Internal Medicine | Admitting: Internal Medicine

## 2024-03-15 DIAGNOSIS — C711 Malignant neoplasm of frontal lobe: Secondary | ICD-10-CM | POA: Insufficient documentation

## 2024-03-15 MED ORDER — GADOBUTROL 1 MMOL/ML IV SOLN
10.0000 mL | Freq: Once | INTRAVENOUS | Status: AC | PRN
  Administered 2024-03-15: 10 mL via INTRAVENOUS

## 2024-03-15 MED ORDER — NYSTATIN 100000 UNIT/ML MT SUSP
5.0000 mL | Freq: Four times a day (QID) | OROMUCOSAL | 1 refills | Status: DC | PRN
  Filled 2024-03-15: qty 140, 7d supply, fill #0

## 2024-03-18 ENCOUNTER — Other Ambulatory Visit: Payer: Self-pay | Admitting: Internal Medicine

## 2024-03-18 ENCOUNTER — Other Ambulatory Visit (HOSPITAL_COMMUNITY): Payer: Self-pay

## 2024-03-18 MED ORDER — NYSTATIN 100000 UNIT/ML MT SUSP: 500000.0000 [IU] | Freq: Four times a day (QID) | OROMUCOSAL | 0 refills | Status: AC

## 2024-03-19 ENCOUNTER — Ambulatory Visit: Admitting: Physical Therapy

## 2024-03-19 ENCOUNTER — Ambulatory Visit: Admitting: Occupational Therapy

## 2024-03-25 ENCOUNTER — Inpatient Hospital Stay (HOSPITAL_BASED_OUTPATIENT_CLINIC_OR_DEPARTMENT_OTHER): Admitting: Internal Medicine

## 2024-03-25 ENCOUNTER — Telehealth: Payer: Self-pay | Admitting: Pharmacist

## 2024-03-25 ENCOUNTER — Telehealth: Payer: Self-pay | Admitting: Pharmacy Technician

## 2024-03-25 ENCOUNTER — Inpatient Hospital Stay

## 2024-03-25 ENCOUNTER — Other Ambulatory Visit: Payer: Self-pay

## 2024-03-25 ENCOUNTER — Other Ambulatory Visit (HOSPITAL_COMMUNITY): Payer: Self-pay

## 2024-03-25 VITALS — BP 130/86 | HR 67 | Temp 97.9°F | Resp 18 | Ht 74.0 in | Wt 286.3 lb

## 2024-03-25 DIAGNOSIS — C711 Malignant neoplasm of frontal lobe: Secondary | ICD-10-CM

## 2024-03-25 DIAGNOSIS — Z510 Encounter for antineoplastic radiation therapy: Secondary | ICD-10-CM | POA: Diagnosis not present

## 2024-03-25 DIAGNOSIS — G40909 Epilepsy, unspecified, not intractable, without status epilepticus: Secondary | ICD-10-CM | POA: Diagnosis not present

## 2024-03-25 LAB — CBC WITH DIFFERENTIAL (CANCER CENTER ONLY)
Abs Immature Granulocytes: 0.03 10*3/uL (ref 0.00–0.07)
Basophils Absolute: 0 10*3/uL (ref 0.0–0.1)
Basophils Relative: 0 %
Eosinophils Absolute: 0.1 10*3/uL (ref 0.0–0.5)
Eosinophils Relative: 1 %
HCT: 53.4 % — ABNORMAL HIGH (ref 39.0–52.0)
Hemoglobin: 17.4 g/dL — ABNORMAL HIGH (ref 13.0–17.0)
Immature Granulocytes: 0 %
Lymphocytes Relative: 13 %
Lymphs Abs: 1.1 10*3/uL (ref 0.7–4.0)
MCH: 27.3 pg (ref 26.0–34.0)
MCHC: 32.6 g/dL (ref 30.0–36.0)
MCV: 83.8 fL (ref 80.0–100.0)
Monocytes Absolute: 0.6 10*3/uL (ref 0.1–1.0)
Monocytes Relative: 7 %
Neutro Abs: 6.6 10*3/uL (ref 1.7–7.7)
Neutrophils Relative %: 79 %
Platelet Count: 178 10*3/uL (ref 150–400)
RBC: 6.37 MIL/uL — ABNORMAL HIGH (ref 4.22–5.81)
RDW: 17 % — ABNORMAL HIGH (ref 11.5–15.5)
WBC Count: 8.4 10*3/uL (ref 4.0–10.5)
nRBC: 0 % (ref 0.0–0.2)

## 2024-03-25 LAB — CMP (CANCER CENTER ONLY)
ALT: 33 U/L (ref 0–44)
AST: 16 U/L (ref 15–41)
Albumin: 4 g/dL (ref 3.5–5.0)
Alkaline Phosphatase: 35 U/L — ABNORMAL LOW (ref 38–126)
Anion gap: 6 (ref 5–15)
BUN: 17 mg/dL (ref 6–20)
CO2: 28 mmol/L (ref 22–32)
Calcium: 9.4 mg/dL (ref 8.9–10.3)
Chloride: 103 mmol/L (ref 98–111)
Creatinine: 1.06 mg/dL (ref 0.61–1.24)
GFR, Estimated: >60 mL/min (ref >60)
Glucose, Bld: 95 mg/dL (ref 70–99)
Potassium: 3.5 mmol/L (ref 3.5–5.1)
Sodium: 137 mmol/L (ref 135–145)
Total Bilirubin: 0.8 mg/dL (ref 0.0–1.2)
Total Protein: 6.6 g/dL (ref 6.5–8.1)

## 2024-03-25 MED ORDER — TEMOZOLOMIDE 180 MG PO CAPS
180.0000 mg | ORAL_CAPSULE | Freq: Every day | ORAL | 0 refills | Status: DC
Start: 2024-03-25 — End: 2024-03-25

## 2024-03-25 MED ORDER — ONDANSETRON HCL 8 MG PO TABS: 8.0000 mg | ORAL_TABLET | Freq: Three times a day (TID) | ORAL | 1 refills | Status: DC | PRN

## 2024-03-25 MED ORDER — DEXAMETHASONE 1 MG PO TABS: ORAL_TABLET | ORAL | 0 refills | Status: AC

## 2024-03-25 MED ORDER — TEMOZOLOMIDE 100 MG PO CAPS
200.0000 mg | ORAL_CAPSULE | Freq: Every day | ORAL | 0 refills | Status: DC
Start: 2024-03-25 — End: 2024-03-25

## 2024-03-25 MED ORDER — TEMOZOLOMIDE 100 MG PO CAPS: 200.0000 mg | ORAL_CAPSULE | Freq: Every day | ORAL | 0 refills | Status: DC

## 2024-03-25 MED ORDER — TEMOZOLOMIDE 180 MG PO CAPS: 180.0000 mg | ORAL_CAPSULE | Freq: Every day | ORAL | 0 refills | Status: DC

## 2024-03-25 NOTE — Progress Notes (Signed)
 [facility] [address]  [place], [place] [address] [phone]   Interval Evaluation  Date of Service: 03/25/24 Patient Name: Nicholas Stevens Patient MRN: [id number] Patient DOB: Mar 23, 1971 Provider: Henreitta Leber, MD  Identifying Statement:  Nicholas Stevens is a 53 y.o. male with right frontal glioblastoma    Oncologic History: Oncology History  Frontal glioblastoma (HCC)  12/18/2023 Surgery   Craniotomy, resection of right frontal mass with Dr. Maisie Fus; path is glioblastoma, IDH pending   01/22/2024 -  Chemotherapy   Patient is on Treatment Plan : BRAIN GLIOBLASTOMA Radiation Therapy With Concurrent Temozolomide 75 mg/m2 Daily Followed By Sequential Maintenance Temozolomide x 6-12 cycles       Biomarkers:  MGMT Unknown.  IDH 1/2 Unknown.  EGFR Unknown  TERT Unknown   Interval History: Nicholas Stevens presents today following recent MRI brain.  No new or progressive changes.  He continues to dose decadron 4mg  daily, decreased from prior.  No further seizures, denies headaches.  Maintains functional independence with baseline fatigue and "shakiness".  Prior- presents for follow up after recent clinical changes.  He describes relatively rapid onset of left sided weakness starting ~10 days ago.  He was unable to walk and use the left arm meaningfully.  No seizure activity appreciated.  Decadron was started 4mg  twice per day after 3-4 days of the weakness.  Once the steroid was started, he returned back to his prior baseline.  Continues on the Keppra.  Denies severe headaches.   H+P (01/08/24) Patient presented with several days of left sided clumsiness, impaired coordination.  Was found to have large right frontal mass, c/w primary brain tumor.  He underwent craniotomy, resection with Dr. Maisie Fus on 12/18/23; path demonstrated glioblastoma.  Following surgery, he had persistent right sided weakness, he is currently in  outpatient PT following discharge from rehab.  No seizures, headaches.     Medications: Current Outpatient Medications on File Prior to Visit  Medication Sig Dispense Refill   apixaban (ELIQUIS) 5 MG TABS tablet Take 1 tablet (5 mg total) by mouth 2 (two) times daily. 60 tablet 0   camphor-menthol (SARNA) lotion Apply topically as needed for itching. 222 mL 0   Cyanocobalamin (B-12 PO) Take 1 tablet by mouth daily.     latanoprost (XALATAN) 0.005 % ophthalmic solution Place 1 drop into both eyes at bedtime.     levETIRAcetam (KEPPRA) 500 MG tablet Take 1 tablet (500 mg total) by mouth 2 (two) times daily. 60 tablet 3   magic mouthwash (nystatin, lidocaine, diphenhydrAMINE, alum & mag hydroxide) suspension Swish and swallow 5 mLs by mouth 4 (four) times daily as needed for mouth pain. 140 mL 1   Multiple Vitamin (MULTIVITAMIN WITH MINERALS) TABS tablet Take 1 tablet by mouth daily.     nystatin (MYCOSTATIN) 100000 UNIT/ML suspension Take 5 mLs (500,000 Units total) by mouth 4 (four) times daily for 10 days. Swish, hold and swallow. 240 mL 0   Omega-3 Fatty Acids (FISH OIL) 1000 MG CAPS Take 1,000 mg by mouth daily.     ondansetron (ZOFRAN) 8 MG tablet Take 1 tablet (8 mg total) by mouth every 8 (eight) hours as needed for nausea or vomiting. May take 30-60 minutes prior to Temodar administration if nausea/vomiting occurs as needed. 30 tablet 1   pantoprazole (PROTONIX) 40 MG tablet Take 1 tablet (40 mg total) by mouth daily. 90 tablet 3   pimecrolimus (ELIDEL) 1 % cream Apply 1  Application topically 2 (two) times daily as needed (Dermatitis).     senna-docusate (SENOKOT-S) 8.6-50 MG tablet Take 2 tablets by mouth daily at 6 (six) AM. 60 tablet 0   triamcinolone cream (KENALOG) 0.1 % Apply 1 Application topically daily. Use on affected areas for 2 weeks, then stop for 2 weeks. Repeat until clear if needed. 453 g 0   zolpidem (AMBIEN) 10 MG tablet TAKE 1 TABLET BY MOUTH EVERYDAY AT BEDTIME 90 tablet  1   No current facility-administered medications on file prior to visit.    Allergies: No Known Allergies Past Medical History:  Past Medical History:  Diagnosis Date   DDD (degenerative disc disease), lumbar    HNP (herniated nucleus pulposus)    Hypertension    Liver hemangioma    Morbid obesity (HCC)    OSA (obstructive sleep apnea)    no cpap used since weight loss   Overweight(278.02)    Plantar fasciitis of right foot    Sleep apnea    Tinea barbae    Past Surgical History:  Past Surgical History:  Procedure Laterality Date   BREATH TEK H PYLORI N/A 08/20/2013   Procedure: BREATH TEK H PYLORI;  Surgeon: Valarie Merino, MD;  Location: [facility];  Service: General;  Laterality: N/A;   COLONOSCOPY WITH PROPOFOL N/A 05/05/2022   Procedure: COLONOSCOPY WITH PROPOFOL;  Surgeon: Beverley Fiedler, MD;  Location: [facility];  Service: Gastroenterology;  Laterality: N/A;   CRANIOTOMY Right 12/18/2023   Procedure: Right Frontal Stereotactic Craniotomy for Resection of Tumor;  Surgeon: Bedelia Person, MD;  Location: [facility] OR;  Service: Neurosurgery;  Laterality: Right;   ESOPHAGOGASTRODUODENOSCOPY (EGD) WITH PROPOFOL N/A 03/15/2023   Procedure: ESOPHAGOGASTRODUODENOSCOPY (EGD) WITH PROPOFOL;  Surgeon: Iva Boop, MD;  Location: [facility];  Service: Gastroenterology;  Laterality: N/A;   LAPAROSCOPIC APPENDECTOMY  2007   Dr Carolynne Edouard   LAPAROSCOPIC GASTRIC SLEEVE RESECTION N/A 10/08/2013   Procedure: LAPAROSCOPIC SLEEVE GASTRECTOMY;  Surgeon: Valarie Merino, MD;  Location: [facility];  Service: General;  Laterality: N/A;   LUMBAR LAMINECTOMY/DECOMPRESSION MICRODISCECTOMY N/A 09/16/2015   Procedure: MICRO LUMBAR DECOMPRESSION, MICRODISCECTOMY L5 - S1;  Surgeon: Jene Every, MD;  Location: [facility];  Service: Orthopedics;  Laterality: N/A;   POLYPECTOMY  05/05/2022   Procedure: POLYPECTOMY;  Surgeon: Beverley Fiedler, MD;  Location: [facility];  Service: Gastroenterology;;   ROTATOR  CUFF REPAIR Right 2005   Dr Lajoyce Corners   Social History:  Social History   Socioeconomic History   Marital status: Married    Spouse name: Deaglan Lile   Number of children: 3   Years of education: Not on file   Highest education level: 12th grade  Occupational History   Occupation: Doctor, general practice PD    Employer: UNEMPLOYED  Tobacco Use   Smoking status: Never    Passive exposure: Never   Smokeless tobacco: Never  Vaping Use   Vaping status: Never Used  Substance and Sexual Activity   Alcohol use: No   Drug use: No   Sexual activity: Not on file  Other Topics Concern   Not on file  Social History Narrative   Married 18+ years   3 children - ages approx 56, 9 and 5...daughter age 43 w/ DM type 1   Social Drivers of Corporate investment banker Strain: Low Risk  (04/02/2023)   Overall Financial Resource Strain (CARDIA)    Difficulty of Paying Living Expenses: Not hard at all  Food Insecurity: No Food  Insecurity (12/18/2023)   Hunger Vital Sign    Worried About Running Out of Food in the Last Year: Never true    Ran Out of Food in the Last Year: Never true  Transportation Needs: No Transportation Needs (12/18/2023)   PRAPARE - Administrator, Civil Service (Medical): No    Lack of Transportation (Non-Medical): No  Physical Activity: Insufficiently Active (04/02/2023)   Exercise Vital Sign    Days of Exercise per Week: 4 days    Minutes of Exercise per Session: 30 min  Stress: No Stress Concern Present (04/02/2023)   [employer] of Occupational Health - Occupational Stress Questionnaire    Feeling of Stress : Not at all  Social Connections: Socially Integrated (04/02/2023)   Social Connection and Isolation Panel [NHANES]    Frequency of Communication with Friends and Family: More than three times a week    Frequency of Social Gatherings with Friends and Family: Once a week    Attends Religious Services: More than 4 times per year    Active Member of [employer] or Organizations:  Yes    Attends Engineer, structural: More than 4 times per year    Marital Status: Married  Catering manager Violence: Not At Risk (12/18/2023)   Humiliation, Afraid, Rape, and Kick questionnaire    Fear of Current or Ex-Partner: No    Emotionally Abused: No    Physically Abused: No    Sexually Abused: No   Family History:  Family History  Problem Relation Age of Onset   Other Mother        hx of DJD   Liver disease Father        ETOH, Hep C   Hypertension Father    Colon cancer Neg Hx    Colon polyps Neg Hx    Esophageal cancer Neg Hx    Rectal cancer Neg Hx    Stomach cancer Neg Hx     Review of Systems: Constitutional: Doesn't report fevers, chills or abnormal weight loss Eyes: Doesn't report blurriness of vision Ears, nose, mouth, throat, and face: Doesn't report sore throat Respiratory: Doesn't report cough, dyspnea or wheezes Cardiovascular: Doesn't report palpitation, chest discomfort  Gastrointestinal:  Doesn't report nausea, constipation, diarrhea GU: Doesn't report incontinence Skin: Doesn't report skin rashes Neurological: Per HPI Musculoskeletal: Doesn't report joint pain Behavioral/Psych: Doesn't report anxiety  Physical Exam: Vitals:   03/25/24 1018  BP: 130/86  Pulse: 67  Resp: 18  Temp: 97.9 F (36.6 C)  SpO2: 100%    KPS: 80. General: Alert, cooperative, pleasant, in no acute distress Head: Normal EENT: No conjunctival injection or scleral icterus.  Lungs: Resp effort normal Cardiac: Regular rate Abdomen: Non-distended abdomen Skin: No rashes cyanosis or petechiae. Extremities: No clubbing or edema  Neurologic Exam: Mental Status: Awake, alert, attentive to examiner. Oriented to self and environment. Language is fluent with intact comprehension.  Cranial Nerves: Visual acuity is grossly normal. Visual fields are full. Extra-ocular movements intact. No ptosis. Face is symmetric Motor: Tone and bulk are normal. Power is 4+/5 in  left arm and leg. Reflexes are symmetric, no pathologic reflexes present.  Sensory: Intact to light touch Gait: Normal.   Labs: I have reviewed the data as listed    Component Value Date/Time   NA 137 03/25/2024 1003   K 3.5 03/25/2024 1003   CL 103 03/25/2024 1003   CO2 28 03/25/2024 1003   GLUCOSE 95 03/25/2024 1003   BUN 17 03/25/2024 1003  CREATININE 1.06 03/25/2024 1003   CALCIUM 9.4 03/25/2024 1003   PROT 6.6 03/25/2024 1003   ALBUMIN 4.0 03/25/2024 1003   AST 16 03/25/2024 1003   ALT 33 03/25/2024 1003   ALKPHOS 35 (L) 03/25/2024 1003   BILITOT 0.8 03/25/2024 1003   GFRNONAA >60 03/25/2024 1003   GFRAA >60 09/15/2015 1520   Lab Results  Component Value Date   WBC 8.4 03/25/2024   NEUTROABS 6.6 03/25/2024   HGB 17.4 (H) 03/25/2024   HCT 53.4 (H) 03/25/2024   MCV 83.8 03/25/2024   PLT 178 03/25/2024    Imaging:  [facility] Clinician Interpretation: I have personally reviewed the CNS images as listed.  My interpretation, in the context of the patient's clinical presentation, is treatment effect vs true progression  MR BRAIN W WO CONTRAST Result Date: 03/15/2024 CLINICAL DATA:  Brain/CNS neoplasm. Right frontal glioblastoma. Gross tumor resection 12/18/2023. Status post stereotactic radio surgery 01/22/2024 and 03/01/2024. Assess treatment response. EXAM: MRI HEAD WITHOUT AND WITH CONTRAST TECHNIQUE: Multiplanar, multiecho pulse sequences of the brain and surrounding structures were obtained without and with intravenous contrast. CONTRAST:  10mL GADAVIST GADOBUTROL 1 MMOL/ML IV SOLN COMPARISON:  MRI head without and with contrast 01/12/2024 and 12/19/2023. FINDINGS: Brain: The enhancing tumor within the surgical bed continues to increase in size. The enhancing area now measures 3.8 scratched at the enhancing area now measures 3.9 x 1.8 x 3.8 cm. Measured in the same planes it previously measured 3.1 x 1.5 x 4.3 cm. The surrounding T2 and FLAIR signal hyperintensity has  increased to superiorly persistent cortical T2 hyperintensity is present in the superior anterior right frontal lobe. Cortical thickness has increased. T2 signal changes extend more posteriorly within the superior right frontal lobe. The T2 signal changes inferior to the tumor bed involving the medial right frontal lobe and right frontal operculum are less prominent than previously seen. No distal areas of enhancement are present. No acute infarct or new hemorrhage is present. Blood products in the surgical cavity are stable. Ventricles are of normal size. No significant extraaxial fluid collection is present. The brainstem and cerebellum are within normal limits. The internal auditory canals are within normal limits. Midline structures are otherwise within normal limits. Vascular: Flow is present in the major intracranial arteries. Skull and upper cervical spine: The craniocervical junction is normal. Upper cervical spine is within normal limits. Marrow signal is unremarkable. Sinuses/Orbits: The paranasal sinuses and mastoid air cells are clear. The globes and orbits are within normal limits. IMPRESSION: 1. Continued increase in size of the enhancing tumor within the surgical bed. 2. Increased surrounding T2 and FLAIR signal hyperintensity suggesting tumor progression. 3. The T2 signal changes inferior to the tumor bed involving the medial right frontal lobe and right frontal operculum are less prominent than previously seen. 4. No distal areas of enhancement are present. Electronically Signed   By: Marin Roberts M.D.   On: 03/15/2024 20:42    Assessment/Plan Frontal glioblastoma (HCC)  Seizure disorder (HCC)  Nicholas Stevens is clinically stable today, now 1 month s/p 6 weeks IMRT and Temodar.  MRI brain demonstrates localized changes within high dose RT field, c/w treatment effect or early tumor regrowth.  We recommended initiating treatment with cycle #1 Temozolomide 150 mg/m2, on for five  days and off for twenty three days in twenty eight day cycles. The patient will have a complete blood count performed on days 21 and 28 of each cycle, and a comprehensive metabolic panel performed on day  28 of each cycle. Labs may need to be performed more often. Zofran will prescribed for home use for nausea/vomiting.   Informed consent was obtained verbally at bedside to proceed with oral chemotherapy.  Chemotherapy should be held for the following:  ANC less than 1,000  Platelets less than 100,000  LFT or creatinine greater than 2x ULN  If clinical concerns/contraindications develop  Recommended decreasing decadron by 1mg  each week, starting with 3mg  daily tomorrow.  Dose may be modified if focal symptoms recur.  Recommended continuing Keppra 500mg  BID for seizure prevention.  We ask that [employer] return to clinic in 4-5 weeks with labs prior to cycle #2, or sooner if needed.  All questions were answered. The patient knows to call the clinic with any problems, questions or concerns. No barriers to learning were detected.  The total time spent in the encounter was 40 minutes and more than 50% was on counseling and review of test results   Henreitta Leber, MD Medical Director of Neuro-Oncology [facility] [place] [facility] 03/25/24 11:19 AM

## 2024-03-25 NOTE — Telephone Encounter (Signed)
 Oral Oncology Patient Advocate Encounter  After completing a benefits investigation, prior authorization for temozoloimde is not required at this time through [employer]. (Prior PA already on file through 01/11/25)  Patient must fill through [facility]  Sherilyn Dacosta, CPhT Specialty Pharmacy Patient Advocate Phone: [phone] Fax: [phone]

## 2024-03-25 NOTE — Telephone Encounter (Signed)
 Oral Oncology Pharmacist Encounter  Received new prescription for Temodar (temozolomide) for the maintenance treatment of glioblastoma, planned duration 6-12 cycles.  CBC w/ Diff and CMP from 03/25/24 assessed, no relevant lab abnormalities requiring baseline dose adjustment required at this time. Prescription dose and frequency assessed for appropriateness.  Current medication list in Epic reviewed, no relevant/significant DDIs with Temodar identified.  Evaluated chart and no patient barriers to medication adherence noted.   Patient agreement for treatment documented in MD note on 03/25/24.  Prescription has been redirected to [facility] for dispensing due to patient's insurance requirements.  Oral Oncology Clinic will continue to follow for insurance authorization, copayment issues, initial counseling and start date.  Sherry Ruffing, PharmD, BCPS, BCOP Hematology/Oncology Clinical Pharmacist [facility] Oral Chemotherapy Navigation Clinics [phone] 03/25/2024 12:36 PM

## 2024-03-26 ENCOUNTER — Telehealth: Payer: Self-pay | Admitting: Internal Medicine

## 2024-03-26 NOTE — Telephone Encounter (Signed)
 Scheduled patient's appts. Advised patient to contact us if rescheduling is needed. Provided my direct line.

## 2024-03-27 ENCOUNTER — Encounter: Payer: Self-pay | Admitting: Internal Medicine

## 2024-03-27 ENCOUNTER — Other Ambulatory Visit: Payer: Self-pay

## 2024-03-27 DIAGNOSIS — C711 Malignant neoplasm of frontal lobe: Secondary | ICD-10-CM

## 2024-03-28 NOTE — Telephone Encounter (Addendum)
 Oral Chemotherapy Pharmacist Encounter  I spoke with patient for overview of: Temodar (temozolomide) for the maintenance treatment of glioblastoma, planned duration 6-12 cycles.   Counseled on administration, dosing, side effects, monitoring, drug-food interactions, safe handling, storage, and disposal.  Patient will take Temodar 100mg  capsules and Temodar 180mg  capsules, 380mg  total daily dose, by mouth once daily, may take at bedtime and on an empty stomach to decrease nausea and vomiting.   Patient will take Temodar daily for 5 days on, 23 days off, and repeated.  Temodar start date: 03/29/24 PM   Adverse effects include but are not limited to: nausea, vomiting, anorexia, GI upset, rash, drug fever, and fatigue. N/V PPX: Patient will take Zofran 8mg  tablet, 1 tablet by mouth 30-60 min prior to Temodar dose to help decrease N/V.  Reviewed importance of keeping a medication schedule and plan for any missed doses. No barriers to medication adherence identified.  Medication reconciliation performed and medication/allergy list updated.  All questions answered.  Mr. Frenette voiced understanding and appreciation.   Medication education handout placed in mail for patient. Patient knows to call the office with questions or concerns. Oral Chemotherapy Clinic phone number provided to patient.   Sherry Ruffing, PharmD, BCPS, BCOP Hematology/Oncology Clinical Pharmacist [facility] Oral Chemotherapy Navigation Clinics [phone] 03/28/2024 9:41 AM

## 2024-03-29 ENCOUNTER — Other Ambulatory Visit: Payer: Self-pay | Admitting: *Deleted

## 2024-03-29 DIAGNOSIS — C711 Malignant neoplasm of frontal lobe: Secondary | ICD-10-CM

## 2024-03-29 NOTE — Progress Notes (Addendum)
  Radiation Oncology         [phone] ________________________________  Name: Nicholas Stevens MRN: [id number]  Date of Service: 03/29/2024  DOB: 01-11-1971  Post Treatment Telephone Note  Diagnosis:  Glioblastoma of the right frontal lobe (as documented in provider EOT note)  The patient was available for call today.  The patient did  note fatigue during radiation but has improved. The patient did not note hair loss or skin changes in the field of radiation during therapy. The patient is taking dexamethasone. The patient does not have symptoms of  weakness or loss of control of the extremities. The patient does not have symptoms of headache. The patient does not have symptoms of seizure or uncontrolled movement. The patient does not have symptoms of changes in vision. The patient does not have changes in speech. The patient does not have confusion.   The patient was counseled that he  will be contacted by our brain and spine navigator to schedule surveillance imaging. The patient was encouraged to call if he  have not received a call to schedule imaging, or if he develops concerns or questions regarding radiation. The patient will also continue to follow up with Dr. Barbaraann Cao in medical oncology.   This concludes the interaction.  Ruel Favors, LPN

## 2024-03-30 ENCOUNTER — Encounter: Payer: Self-pay | Admitting: Internal Medicine

## 2024-03-30 ENCOUNTER — Other Ambulatory Visit: Payer: Self-pay | Admitting: Internal Medicine

## 2024-04-01 ENCOUNTER — Encounter: Payer: Self-pay | Admitting: Internal Medicine

## 2024-04-01 ENCOUNTER — Other Ambulatory Visit (HOSPITAL_COMMUNITY): Payer: Self-pay

## 2024-04-01 ENCOUNTER — Ambulatory Visit
Admission: RE | Admit: 2024-04-01 | Discharge: 2024-04-01 | Disposition: A | Discharge status: Home / Self Care | Source: Ambulatory Visit | Attending: Neurosurgery | Admitting: Neurosurgery

## 2024-04-01 DIAGNOSIS — G40909 Epilepsy, unspecified, not intractable, without status epilepticus: Secondary | ICD-10-CM | POA: Insufficient documentation

## 2024-04-01 DIAGNOSIS — Z79899 Other long term (current) drug therapy: Secondary | ICD-10-CM | POA: Insufficient documentation

## 2024-04-01 DIAGNOSIS — Z510 Encounter for antineoplastic radiation therapy: Secondary | ICD-10-CM | POA: Insufficient documentation

## 2024-04-01 DIAGNOSIS — C711 Malignant neoplasm of frontal lobe: Secondary | ICD-10-CM | POA: Insufficient documentation

## 2024-04-01 MED ORDER — APIXABAN 5 MG PO TABS
5.0000 mg | ORAL_TABLET | Freq: Two times a day (BID) | ORAL | 0 refills | Status: DC
  Filled 2024-04-01: qty 60, 30d supply, fill #0

## 2024-04-04 ENCOUNTER — Other Ambulatory Visit: Payer: Self-pay | Admitting: Family

## 2024-04-04 ENCOUNTER — Encounter: Payer: Self-pay | Admitting: Occupational Therapy

## 2024-04-04 NOTE — Therapy (Signed)
 [facility] [address] [place], [place], [address] Phone: [phone]   Fax:  [phone]  Patient Details  Name: Nicholas Stevens MRN: [id number] Date of Birth: 09/24/1971 Referring Provider:  No ref. provider found  Encounter Date: 04/04/2024  OCCUPATIONAL THERAPY DISCHARGE SUMMARY  Visits from Start of Care: 8 (from eval 01/02/24 to last appt 02/08/24) Pt cancelled 02/29/24 and again 03/19/24 due to radiation treatments.  Current functional level related to goals / functional outcomes: Pt had met 6 goals to satisfactory level and had 3 additional goals that were being addressed prior to this unexpected discharge.   Remaining deficits: Pt had several ongoing functional deficits including limited LUE grip strength compared to RUE, some LUE coordination deficits affecting typing (necessary for his job) as well as activity tolerance for other work related activities.  Education / Equipment: Pt has Mining engineer and education re: HEP for strength, coordination and ROM. Pt understood how to continue on with self-management. See tx notes for more details.   Patient discharged from outpatient occupational therapy due to medical complications at the end of last month.     Victorino Sparrow, OT 04/04/2024, 1:26 PM  [facility] [address] [place], [place], [address] Phone: [phone]   Fax:  [phone]

## 2024-04-07 NOTE — Therapy (Signed)
 OUTPATIENT PHYSICAL THERAPY NEURO EVALUATION   Patient Name: Nicholas Stevens MRN: [id number] DOB:[birth year], 53 y.o., male Today's Date: 04/08/2024   PCP: Genia Kettering, MD  REFERRING PROVIDER: Vaslow, Zachary K, MD   END OF SESSION:  PT End of Session - 04/08/24 1318     Visit Number 1    Number of Visits 5    Date for PT Re-Evaluation 05/08/24    Authorization Type UHC    PT Start Time 1316    PT Stop Time 1346    PT Time Calculation (min) 30 min    Equipment Utilized During Treatment Gait belt    Activity Tolerance Patient tolerated treatment well    Behavior During Therapy WFL for tasks assessed/performed             Past Medical History:  Diagnosis Date   DDD (degenerative disc disease), lumbar    HNP (herniated nucleus pulposus)    Hypertension    Liver hemangioma    Morbid obesity (HCC)    OSA (obstructive sleep apnea)    no cpap used since weight loss   Overweight(278.02)    Plantar fasciitis of right foot    Sleep apnea    Tinea barbae    Past Surgical History:  Procedure Laterality Date   BREATH TEK H PYLORI N/A 08/20/2013   Procedure: BREATH TEK Jorja Newport;  Surgeon: Azucena Bollard, MD;  Location: [facility];  Service: General;  Laterality: N/A;   COLONOSCOPY WITH PROPOFOL N/A 05/05/2022   Procedure: COLONOSCOPY WITH PROPOFOL;  Surgeon: Nannette Babe, MD;  Location: [facility];  Service: Gastroenterology;  Laterality: N/A;   CRANIOTOMY Right 12/18/2023   Procedure: Right Frontal Stereotactic Craniotomy for Resection of Tumor;  Surgeon: Van Gelinas, MD;  Location: [facility] OR;  Service: Neurosurgery;  Laterality: Right;   ESOPHAGOGASTRODUODENOSCOPY (EGD) WITH PROPOFOL N/A 03/15/2023   Procedure: ESOPHAGOGASTRODUODENOSCOPY (EGD) WITH PROPOFOL;  Surgeon: Kenney Peacemaker, MD;  Location: [facility];  Service: Gastroenterology;  Laterality: N/A;   LAPAROSCOPIC APPENDECTOMY  2007   Dr Alethea Andes   LAPAROSCOPIC GASTRIC SLEEVE RESECTION N/A  10/08/2013   Procedure: LAPAROSCOPIC SLEEVE GASTRECTOMY;  Surgeon: Azucena Bollard, MD;  Location: [facility];  Service: General;  Laterality: N/A;   LUMBAR LAMINECTOMY/DECOMPRESSION MICRODISCECTOMY N/A 09/16/2015   Procedure: MICRO LUMBAR DECOMPRESSION, MICRODISCECTOMY L5 - S1;  Surgeon: Orvan Blanch, MD;  Location: [facility];  Service: Orthopedics;  Laterality: N/A;   POLYPECTOMY  05/05/2022   Procedure: POLYPECTOMY;  Surgeon: Nannette Babe, MD;  Location: [facility];  Service: Gastroenterology;;   ROTATOR CUFF REPAIR Right 2005   Dr Julio Ohm   Patient Active Problem List   Diagnosis Date Noted   Seizure disorder (HCC) 02/09/2024   Focal seizure (HCC) 01/22/2024   Allergic drug rash 12/29/2023   DVT, lower extremity, distal (HCC) 12/29/2023   Frontal glioblastoma (HCC) 12/26/2023   Brain mass 12/15/2023   Pseudofolliculitis barbae 05/01/2023   Iron deficiency 04/06/2023   Abdominal pain 03/15/2023   Microcytosis 03/14/2023   RUQ pain 03/12/2023   Abnormal EKG 03/12/2023   Chest pain 03/12/2023   Adult acne 01/19/2023   Obesity (BMI 30-39.9) 01/19/2023   Benign neoplasm of cecum    Benign neoplasm of ascending colon    Benign neoplasm of transverse colon    Difficult airway for intubation 01/18/2022   Upper respiratory disease 01/31/2019   Cerumen impaction 07/30/2018   HTN (hypertension) 11/03/2017   Spinal stenosis of lumbar region 09/16/2015   Insomnia  04/15/2015   Well adult exam 11/04/2014   H/O gastric sleeve 10/08/2013   Hypogonadism, male 07/17/2013   Erectile dysfunction 03/22/2013   Concentration deficit 04/27/2011   Attention or concentration deficit 04/27/2011   TINEA BARBAE 03/04/2009   LIVER HEMANGIOMA 03/04/2009   OVERWEIGHT-BMI 42 03/04/2009   OSA on CPAP 03/04/2009   DEGENERATIVE DISC DISEASE, LUMBAR SPINE 03/04/2009    ONSET DATE: 03/29/2024  REFERRING DIAG: C71.1 (ICD-10-CM) - Frontal glioblastoma (HCC)   THERAPY DIAG:  Muscle weakness  (generalized)  Other symptoms and signs involving the nervous system  Unsteadiness on feet  Other abnormalities of gait and mobility  Rationale for Evaluation and Treatment: Rehabilitation  SUBJECTIVE:                                                                                                                                                                                             SUBJECTIVE STATEMENT: Finished radiation March 7th and did a round of chemo pills last week for 5 days. Pt seen previously here for therapy and when he got radiation it took the strength out of him. Had swelling in his brain which affected his balance and strength, but feeling better with that after being on steroids. Had to cancel last 2 weeks of appts in therapy before due to radiation. Last appt was 02/08/24. Started back with his exercises from previous bout of PT   Pt accompanied by:  Mom, Glinda   PERTINENT HISTORY: 53 yo male presenting 12/20 to ED with decreased response time while driving, incoordination, imbalance. Imaging showed necrotic and hemorrhagic mass in the superior right frontal lobe with features of glioblastoma with mass effect. S/p R craniotomy 12/23. PMH includes: obesity and OSA.   Per Dr. Mark Sil: [name] is clinically stable today, now 1 month s/p 6 weeks IMRT and Temodar.  MRI brain demonstrates localized changes within high dose RT field, c/w treatment effect or early tumor regrowth.    PAIN:  Are you having pain? No  Vitals:   04/08/24 1324  BP: 107/71  Pulse: 70     PRECAUTIONS: Fall  FALLS: Has patient fallen in last 6 months? No  LIVING ENVIRONMENT: Lives with: lives with their spouse Lives in: House/apartment Stairs: Yes: Internal: 12 steps; can reach both, master bedroom on main level  Has following equipment at home: None   PLOF: Independent pt retired from [employer] In 2022 - worked for [employer]   PATIENT GOALS: Wants to work  on strengthening   OBJECTIVE:  Note: Objective measures were completed at Evaluation unless otherwise noted.  DIAGNOSTIC FINDINGS: MRI brain 03/15/24  IMPRESSION: 1. Continued increase in size of the enhancing tumor within the surgical bed. 2. Increased surrounding T2 and FLAIR signal hyperintensity suggesting tumor progression. 3. The T2 signal changes inferior to the tumor bed involving the medial right frontal lobe and right frontal operculum are less prominent than previously seen. 4. No distal areas of enhancement are present.  COGNITION: Overall cognitive status: Within functional limits for tasks assessed   SENSATION: WFL  COORDINATION: Heel to shin: WFL   POSTURE: No Significant postural limitations  LOWER EXTREMITY MMT:    MMT Right Eval Left Eval  Hip flexion 5 4  Hip extension 5 4+  Hip abduction 5 4+  Hip adduction    Hip internal rotation    Hip external rotation    Knee flexion 5 5  Knee extension 5 4+  Ankle dorsiflexion 5 4+  Ankle plantarflexion    Ankle inversion    Ankle eversion    (Blank rows = not tested)   STAIRS:  Level of Assistance: Modified independence  Stair Negotiation Technique: Alternating Pattern  with No Rails  Number of Stairs: 4   Height of Stairs: 6"    GAIT: Gait pattern: WFL and step through pattern Distance walked: Clinic distances  Assistive device utilized: None Level of assistance: Complete Independence Comments: No unsteadiness noted during gait    FUNCTIONAL TESTS:  30 seconds chair stand test: 19 sit <> stands with no UE support, 4/10 RPE afterwards  SLS:  LLE: 6.4 seconds with Trendelenberg, RLE: ~1-2 seconds    .  M-CTSIB  Condition 1: Firm Surface, EO 30 Sec, Normal Sway  Condition 2: Firm Surface, EC 30 Sec, Normal Sway  Condition 3: Foam Surface, EO 30 Sec, Normal Sway  Condition 4: Foam Surface, EC 30 Sec, Mild Sway    [facility] PT Assessment - 04/08/24 1335       Functional Gait  Assessment    Gait assessed  Yes    Gait Level Surface Walks 20 ft in less than 5.5 sec, no assistive devices, good speed, no evidence for imbalance, normal gait pattern, deviates no more than 6 in outside of the 12 in walkway width.    Change in Gait Speed Able to smoothly change walking speed without loss of balance or gait deviation. Deviate no more than 6 in outside of the 12 in walkway width.    Gait with Horizontal Head Turns Performs head turns smoothly with no change in gait. Deviates no more than 6 in outside 12 in walkway width    Gait with Vertical Head Turns Performs head turns with no change in gait. Deviates no more than 6 in outside 12 in walkway width.    Gait and Pivot Turn Pivot turns safely within 3 sec and stops quickly with no loss of balance.    Step Over Obstacle Is able to step over 2 stacked shoe boxes taped together (9 in total height) without changing gait speed. No evidence of imbalance.    Gait with Narrow Base of Support Ambulates 4-7 steps.    Gait with Eyes Closed Walks 20 ft, no assistive devices, good speed, no evidence of imbalance, normal gait pattern, deviates no more than 6 in outside 12 in walkway width. Ambulates 20 ft in less than 7 sec.    Ambulating Backwards Walks 20 ft, no assistive devices, good speed, no evidence for imbalance, normal gait    Steps Alternating feet, no rail.    Total Score 28    FGA  comment: 28/30 = Low Fall Risk                                                                                                                                          TREATMENT DATE:  N/A during eval    PATIENT EDUCATION: Education details: Clinical findings, POC, continue with HEP from previous bout of therapy (pt had just re-started doing these exercises recently) Person educated: Patient and Parent Education method: Explanation Education comprehension: verbalized understanding  HOME EXERCISE PROGRAM: From previous bout of therapy: Access Code: [email or web address]   Will review/update as needed  GOALS: Goals reviewed with patient? Yes  SHORT TERM GOALS: ALL STGS = LTGS  LONG TERM GOALS: Target date: 05/06/2024  Pt will be independent with final HEP for strength and high level balance in order to build upon functional gains made in therapy. Baseline:  Goal status: INITIAL  2.  Patient will improve single leg stance to 10 seconds bilaterally to demonstrate progress towards decreased risk for falls.  Baseline: LLE: 6.4 seconds with Trendelenberg, RLE: ~1-2 seconds  Goal status: INITIAL  3.  Pt will improve FGA to a 30/30 in order to demo improved balance with tandem stance.  Baseline: 28/30 Goal status: INITIAL    ASSESSMENT:  CLINICAL IMPRESSION: Patient is a 53 year old male referred to Neuro [facility] for frontal glioblastoma. Pt well known to this clinic and was previously seen for therapy, but was not able to finish out previous POC due to radiation. Pt finished radiation on March 7th. Pt's PMH is significant for: S/p R craniotomy 12/23, obesity and OSA. Pt reports some LLE weakness. The following deficits were present during the exam: impaired tandem gait, decr SLS time, decr LLE strength. Based on FGA, pt is at a very low risk for falls. Pt would benefit from skilled PT to address these impairments and functional limitations to maximize functional mobility independence and improve strength.    OBJECTIVE IMPAIRMENTS: decreased balance and decreased strength.   ACTIVITY LIMITATIONS: locomotion level  PARTICIPATION LIMITATIONS: community activity  PERSONAL FACTORS: Age, Past/current experiences, Time since onset of injury/illness/exacerbation, and 3+ comorbidities: S/p R craniotomy 12/23, obesity and OSA.   are also affecting patient's functional outcome.   REHAB POTENTIAL: Good  CLINICAL DECISION MAKING: Stable/uncomplicated  EVALUATION COMPLEXITY: Low  PLAN:  PT FREQUENCY: 1x/week  PT DURATION: 4 weeks  PLANNED INTERVENTIONS:  97164- PT Re-evaluation, 97110-Therapeutic exercises, 97530- Therapeutic activity, [id number]- Neuromuscular re-education, 97535- Self Care, 16109- Manual therapy, [id number]- Gait training, Patient/Family education, Balance training, Stair training, and Vestibular training  PLAN FOR NEXT SESSION: review previous HEP and make exercises harder as needed for balance and LLE strength. Work on LLE strengthening, SLS, and high level balance tasks    Seabron Cypress, PT, DPT 04/08/2024, 1:53 PM

## 2024-04-08 ENCOUNTER — Other Ambulatory Visit: Payer: Self-pay | Admitting: Internal Medicine

## 2024-04-08 ENCOUNTER — Ambulatory Visit: Attending: Physician Assistant | Admitting: Physical Therapy

## 2024-04-08 VITALS — BP 107/71 | HR 70

## 2024-04-08 DIAGNOSIS — R2689 Other abnormalities of gait and mobility: Secondary | ICD-10-CM | POA: Insufficient documentation

## 2024-04-08 DIAGNOSIS — M6281 Muscle weakness (generalized): Secondary | ICD-10-CM | POA: Diagnosis present

## 2024-04-08 DIAGNOSIS — R2681 Unsteadiness on feet: Secondary | ICD-10-CM | POA: Diagnosis present

## 2024-04-08 DIAGNOSIS — R29818 Other symptoms and signs involving the nervous system: Secondary | ICD-10-CM | POA: Diagnosis present

## 2024-04-08 MED ORDER — PANTOPRAZOLE SODIUM 40 MG PO TBEC: 40.0000 mg | DELAYED_RELEASE_TABLET | Freq: Two times a day (BID) | ORAL | 3 refills | Status: DC

## 2024-04-11 ENCOUNTER — Other Ambulatory Visit: Payer: Self-pay | Admitting: Family

## 2024-04-11 MED ORDER — FLUCONAZOLE 150 MG PO TABS: 150.0000 mg | ORAL_TABLET | ORAL | 0 refills | Status: DC

## 2024-04-17 ENCOUNTER — Ambulatory Visit: Admitting: Physical Therapy

## 2024-04-17 ENCOUNTER — Other Ambulatory Visit: Payer: Self-pay | Admitting: Internal Medicine

## 2024-04-17 DIAGNOSIS — C711 Malignant neoplasm of frontal lobe: Secondary | ICD-10-CM

## 2024-04-18 NOTE — Telephone Encounter (Signed)
 Pt was given 2 tablets of diflucan  for thrush and took the 1st pill on Saturday but accidentally threw away the 2nd pill. Pt wants to know if ok to send in 1 more pill to replace the one lost?

## 2024-04-21 ENCOUNTER — Other Ambulatory Visit: Payer: Self-pay | Admitting: Internal Medicine

## 2024-04-21 MED ORDER — FLUCONAZOLE 100 MG PO TABS: ORAL_TABLET | ORAL | 1 refills | Status: AC

## 2024-04-25 ENCOUNTER — Inpatient Hospital Stay

## 2024-04-25 ENCOUNTER — Encounter: Payer: Self-pay | Admitting: Internal Medicine

## 2024-04-25 ENCOUNTER — Other Ambulatory Visit: Payer: Self-pay | Admitting: Genetic Counselor

## 2024-04-25 ENCOUNTER — Inpatient Hospital Stay (HOSPITAL_BASED_OUTPATIENT_CLINIC_OR_DEPARTMENT_OTHER): Attending: Radiation Oncology | Admitting: Genetic Counselor

## 2024-04-25 DIAGNOSIS — C711 Malignant neoplasm of frontal lobe: Secondary | ICD-10-CM

## 2024-04-25 DIAGNOSIS — G40909 Epilepsy, unspecified, not intractable, without status epilepticus: Secondary | ICD-10-CM | POA: Insufficient documentation

## 2024-04-25 DIAGNOSIS — Z1379 Encounter for other screening for genetic and chromosomal anomalies: Secondary | ICD-10-CM

## 2024-04-25 LAB — GENETIC SCREENING ORDER

## 2024-04-26 ENCOUNTER — Encounter: Payer: Self-pay | Admitting: Physical Therapy

## 2024-04-26 ENCOUNTER — Ambulatory Visit: Attending: Physician Assistant | Admitting: Physical Therapy

## 2024-04-26 ENCOUNTER — Encounter: Payer: Self-pay | Admitting: Internal Medicine

## 2024-04-26 VITALS — BP 142/100 | HR 102

## 2024-04-26 DIAGNOSIS — R29818 Other symptoms and signs involving the nervous system: Secondary | ICD-10-CM | POA: Insufficient documentation

## 2024-04-26 DIAGNOSIS — R2681 Unsteadiness on feet: Secondary | ICD-10-CM | POA: Diagnosis present

## 2024-04-26 DIAGNOSIS — R2689 Other abnormalities of gait and mobility: Secondary | ICD-10-CM | POA: Insufficient documentation

## 2024-04-26 DIAGNOSIS — M6281 Muscle weakness (generalized): Secondary | ICD-10-CM | POA: Diagnosis present

## 2024-04-26 NOTE — Therapy (Signed)
 OUTPATIENT PHYSICAL THERAPY NEURO EVALUATION   Patient Name: Nicholas Stevens MRN: [id number] DOB:1971/01/21, 53 y.o., male Today's Date: 04/26/2024   PCP: Nicholas Kettering, MD  REFERRING PROVIDER: Vaslow, Zachary K, MD   END OF SESSION:  PT End of Session - 04/26/24 1317     Visit Number 2    Number of Visits 5    Date for PT Re-Evaluation 05/08/24    Authorization Type UHC    PT Start Time 1315    PT Stop Time 1356    PT Time Calculation (min) 41 min    Equipment Utilized During Treatment Gait belt    Activity Tolerance Patient tolerated treatment well    Behavior During Therapy WFL for tasks assessed/performed             Past Medical History:  Diagnosis Date   DDD (degenerative disc disease), lumbar    HNP (herniated nucleus pulposus)    Hypertension    Liver hemangioma    Morbid obesity (HCC)    OSA (obstructive sleep apnea)    no cpap used since weight loss   Overweight(278.02)    Plantar fasciitis of right foot    Sleep apnea    Tinea barbae    Past Surgical History:  Procedure Laterality Date   BREATH TEK H PYLORI N/A 08/20/2013   Procedure: BREATH TEK Nicholas Stevens;  Surgeon: Nicholas Bollard, MD;  [facility];  Service: General;  Laterality: N/A;   COLONOSCOPY WITH PROPOFOL  N/A 05/05/2022   Procedure: COLONOSCOPY WITH PROPOFOL ;  Surgeon: Nicholas Babe, MD;  Location: [facility];  Service: Gastroenterology;  Laterality: N/A;   CRANIOTOMY Right 12/18/2023   Procedure: Right Frontal Stereotactic Craniotomy for Resection of Tumor;  Surgeon: [facility] Gelinas, MD;  [facility];  Service: Neurosurgery;  Laterality: Right;   ESOPHAGOGASTRODUODENOSCOPY (EGD) WITH PROPOFOL  N/A 03/15/2023   Procedure: ESOPHAGOGASTRODUODENOSCOPY (EGD) WITH PROPOFOL ;  Surgeon: Nicholas Peacemaker, MD;  Location: [facility];  Service: Gastroenterology;  Laterality: N/A;   LAPAROSCOPIC APPENDECTOMY  2007   Nicholas Stevens   LAPAROSCOPIC GASTRIC SLEEVE RESECTION N/A  10/08/2013   Procedure: LAPAROSCOPIC SLEEVE GASTRECTOMY;  Surgeon: Nicholas Bollard, MD;  Location: [facility];  Service: General;  Laterality: N/A;   LUMBAR LAMINECTOMY/DECOMPRESSION MICRODISCECTOMY N/A 09/16/2015   Procedure: MICRO LUMBAR DECOMPRESSION, MICRODISCECTOMY L5 - S1;  Surgeon: Nicholas Blanch, MD;  Location: [facility];  Service: Orthopedics;  Laterality: N/A;   POLYPECTOMY  05/05/2022   Procedure: POLYPECTOMY;  Surgeon: Nicholas Babe, MD;  [facility];  Service: Gastroenterology;;   ROTATOR CUFF REPAIR Right 2005   Nicholas Nicholas Stevens   Patient Active Problem List   Diagnosis Date Noted   Seizure disorder (HCC) 02/09/2024   Focal seizure (HCC) 01/22/2024   Allergic drug rash 12/29/2023   DVT, lower extremity, distal (HCC) 12/29/2023   Frontal glioblastoma (HCC) 12/26/2023   Brain mass 12/15/2023   Pseudofolliculitis barbae 05/01/2023   Iron deficiency 04/06/2023   Abdominal pain 03/15/2023   Microcytosis 03/14/2023   RUQ pain 03/12/2023   Abnormal EKG 03/12/2023   Chest pain 03/12/2023   Adult acne 01/19/2023   Obesity (BMI 30-39.9) 01/19/2023   Benign neoplasm of cecum    Benign neoplasm of ascending colon    Benign neoplasm of transverse colon    Difficult airway for intubation 01/18/2022   Upper respiratory disease 01/31/2019   Cerumen impaction 07/30/2018   HTN (hypertension) 11/03/2017   Spinal stenosis of lumbar region 09/16/2015   Insomnia  04/15/2015   Well adult exam 11/04/2014   H/O gastric sleeve 10/08/2013   Hypogonadism, male 07/17/2013   Erectile dysfunction 03/22/2013   Concentration deficit 04/27/2011   Attention or concentration deficit 04/27/2011   TINEA BARBAE 03/04/2009   LIVER HEMANGIOMA 03/04/2009   OVERWEIGHT-BMI 42 03/04/2009   OSA on CPAP 03/04/2009   DEGENERATIVE DISC DISEASE, LUMBAR SPINE 03/04/2009    ONSET DATE: 03/29/2024  REFERRING DIAG: C71.1 (ICD-10-CM) - Frontal glioblastoma (HCC)   THERAPY DIAG:  Muscle weakness  (generalized)  Other symptoms and signs involving the nervous system  Unsteadiness on feet  Other abnormalities of gait and mobility  Rationale for Evaluation and Treatment: Rehabilitation  SUBJECTIVE:                                                                                                                                                                                             SUBJECTIVE STATEMENT: Since eval, pt reporting LLE got weaker. Talked to Nicholas. Mark Stevens about it and they upped his steroids and the weakness got a lot better. Was only taking 1 a day and now taking 2 a day.   Pt accompanied by:  Spouse  PERTINENT HISTORY: 53 yo male presenting 12/20 to ED with decreased response time while driving, incoordination, imbalance. Imaging showed necrotic and hemorrhagic mass in the superior right frontal lobe with features of glioblastoma with mass effect. S/p R craniotomy 12/23. PMH includes: obesity and OSA.   Per Nicholas. Mark Stevens: Nicholas Stevens is clinically stable today, now 1 month s/p 6 weeks IMRT and Temodar .  MRI brain demonstrates localized changes within high dose RT field, c/w treatment effect or early tumor regrowth.    PAIN:  Are you having pain? No  Vitals:   04/26/24 1320 04/26/24 1340  BP: (!) 144/93 (!) 142/100  Pulse: 79 (!) 102      PRECAUTIONS: Fall  FALLS: Has patient fallen in last 6 months? No  LIVING ENVIRONMENT: Lives with: lives with their spouse Lives in: House/apartment Stairs: Yes: Internal: 12 steps; can reach both, master bedroom on main level  Has following equipment at home: None   PLOF: Independent pt retired from [employer] In 2022 - worked for [employer]   PATIENT GOALS: Wants to work on strengthening   OBJECTIVE:  Note: Objective measures were completed at Evaluation unless otherwise noted.  DIAGNOSTIC FINDINGS: MRI brain 03/15/24 IMPRESSION: 1. Continued increase in size of the enhancing tumor within  the surgical bed. 2. Increased surrounding T2 and FLAIR signal hyperintensity suggesting tumor progression. 3. The T2 signal changes inferior to the tumor bed involving the medial right  frontal lobe and right frontal operculum are less prominent than previously seen. 4. No distal areas of enhancement are present.  COGNITION: Overall cognitive status: Within functional limits for tasks assessed   SENSATION: WFL  COORDINATION: Heel to shin: WFL   POSTURE: No Significant postural limitations  LOWER EXTREMITY MMT:    MMT Right Eval Left Eval  Hip flexion 5 4  Hip extension 5 4+  Hip abduction 5 4+  Hip adduction    Hip internal rotation    Hip external rotation    Knee flexion 5 5  Knee extension 5 4+  Ankle dorsiflexion 5 4+  Ankle plantarflexion    Ankle inversion    Ankle eversion    (Blank rows = not tested)   STAIRS:  Level of Assistance: Modified independence  Stair Negotiation Technique: Alternating Pattern  with No Rails  Number of Stairs: 4   Height of Stairs: 6"    GAIT: Gait pattern: WFL and step through pattern Distance walked: Clinic distances  Assistive device utilized: None Level of assistance: Complete Independence Comments: No unsteadiness noted during gait    FUNCTIONAL TESTS:  30 seconds chair stand test: 19 sit <> stands with no UE support, 4/10 RPE afterwards  SLS:  LLE: 6.4 seconds with Trendelenberg, RLE: ~1-2 seconds    .  M-CTSIB  Condition 1: Firm Surface, EO 30 Sec, Normal Sway  Condition 2: Firm Surface, EC 30 Sec, Normal Sway  Condition 3: Foam Surface, EO 30 Sec, Normal Sway  Condition 4: Foam Surface, EC 30 Sec, Mild Sway      TREATMENT DATE: 04/26/24   Therapeutic Activity:   Vitals:   04/26/24 1320 04/26/24 1340  BP: (!) 144/93 (!) 142/100  Pulse: 79 (!) 102   BP at 1:43 PM after seated rest break: 138/97  Assessed BP during session with elevated BP, pt asymptomatic. Discussed making sure that pt is  monitoring BP at home now that pt has had an incr in his steroid medication    Therapeutic Exercise:  Elliptical at gear 1.5 for a total of 3:30 minutes, doing 2 minutes forwards and 1:30 minutes backwards (alternating between each minute), pt reporting RPE as 8/10, pt fatigues more easily going backwards   With 3# ankle weights in standing  Slow forward marching with 3 second holds for SLS, down and back 3 reps in // bars, needing intermittent UE support for balance, pt more challenged with hip flexion on LLE   2 x 10 reps hip ABD on each side, cues for soft bend in stance leg  2 x 10 reps hip extension on each side   With 3# ankle weights in sitting 2 x 10 reps hip ABD over kettlebell, 1st rep with LLE, pt would knock down kettlebell, but improved with 2nd rep  2 x 10 reps LAQs with LLE with 3 second hold   10 reps sit <> stands with heel raise, pt improving with time in heel raise with incr reps   PATIENT EDUCATION: Education details: Additions to HEP for standing strength/balance with use of 3# ankle weights at home  Person educated: Patient and Parent Education method: Explanation, Demonstration, Verbal cues, and Handouts Education comprehension: verbalized understanding and returned demonstration  HOME EXERCISE PROGRAM: From previous bout of therapy: Access Code: [email or web address]  Will review/update as needed  New additions on 04/26/24 for pt to perform with 3# ankle weights: Access Code: [email or web address] URL: [email or web address] Date: 04/26/2024 Prepared by: Jonathan Neighbor  Exercises - Standing Hip Abduction  with Counter Support  - 1 x daily - 5 x weekly - 2 sets - 10 reps - Standing Marching  - 1 x daily - 5 x weekly - 3 sets - Standing Hip Extension with Counter Support  - 1 x daily - 5 x weekly - 2 sets - 10 reps - Seated Long Arc Quad  - 1 x daily - 5 x weekly - 2 sets - 10 reps  GOALS: Goals reviewed with patient? Yes  SHORT TERM GOALS: ALL STGS = LTGS  LONG  TERM GOALS: Target date: 05/06/2024  Pt will be independent with final HEP for strength and high level balance in order to build upon functional gains made in therapy. Baseline:  Goal status: INITIAL  2.  Patient will improve single leg stance to 10 seconds bilaterally to demonstrate progress towards decreased risk for falls.  Baseline: LLE: 6.4 seconds with Trendelenberg, RLE: ~1-2 seconds  Goal status: INITIAL  3.  Pt will improve FGA to a 30/30 in order to demo improved balance with tandem stance.  Baseline: 28/30 Goal status: INITIAL    ASSESSMENT:  CLINICAL IMPRESSION: Pt's BP initially elevated, but WNL to participate in PT. Worked on elliptical and balance/strength tasks in seated and standing with 3# ankle weight (pt has one at home) to add to HEP. After re-assessing BP during session, it was 142/100, with pt asymptomatic, and pt's BP went down after seated rest break. Educated to monitor at home. Pt fatigues more with exercises with LLE. Will continue per POC.    OBJECTIVE IMPAIRMENTS: decreased balance and decreased strength.   ACTIVITY LIMITATIONS: locomotion level  PARTICIPATION LIMITATIONS: community activity  PERSONAL FACTORS: Age, Past/current experiences, Time since onset of injury/illness/exacerbation, and 3+ comorbidities: S/p R craniotomy 12/23, obesity and OSA.   are also affecting patient's functional outcome.   REHAB POTENTIAL: Good  CLINICAL DECISION MAKING: Stable/uncomplicated  EVALUATION COMPLEXITY: Low  PLAN:  PT FREQUENCY: 1x/week  PT DURATION: 4 weeks  PLANNED INTERVENTIONS: 97164- PT Re-evaluation, 97110-Therapeutic exercises, 97530- Therapeutic activity, 97112- Neuromuscular re-education, 97535- Self Care, [id number]- Manual therapy, [phone]- Gait training, Patient/Family education, Balance training, Stair training, and Vestibular training  PLAN FOR NEXT SESSION: add to HEP as needed and make exercises harder as needed for balance and LLE strength.  Work on LLE strengthening, SLS, and high level balance tasks   MONITOR BP    Seabron Cypress, PT, DPT 04/26/2024, 1:57 PM

## 2024-04-26 NOTE — Progress Notes (Signed)
 REFERRING PROVIDER: Allana Ishikawa, PA-C  PRIMARY PROVIDER:  Plotnikov, Oakley Bellman, MD  PRIMARY REASON FOR VISIT:  Encounter Diagnosis  Name Primary?   Frontal glioblastoma (HCC) Yes   HISTORY OF PRESENT ILLNESS:   Nicholas Stevens, a 53 y.o. male, was seen for a [facility] cancer genetics consultation at the request of Allana Ishikawa, PA due to a personal history of a glioblastoma.  Nicholas Stevens presents to clinic today to discuss the possibility of a hereditary predisposition to cancer, to discuss genetic testing, and to further clarify his future cancer risks, as well as potential cancer risks for family members.   Nicholas Stevens was diagnosed with a glioblastoma at age 57.  CANCER HISTORY:  Oncology History  Frontal glioblastoma (HCC)  12/18/2023 Surgery   Craniotomy, resection of right frontal mass with Dr. Andy Bannister; path is glioblastoma, IDH pending   01/22/2024 -  Chemotherapy   Patient is on Treatment Plan : BRAIN GLIOBLASTOMA Radiation Therapy With Concurrent Temozolomide  75 mg/m2 Daily Followed By Sequential Maintenance Temozolomide  x 6-12 cycles      Past Medical History:  Diagnosis Date   DDD (degenerative disc disease), lumbar    HNP (herniated nucleus pulposus)    Hypertension    Liver hemangioma    Morbid obesity (HCC)    OSA (obstructive sleep apnea)    no cpap used since weight loss   Overweight(278.02)    Plantar fasciitis of right foot    Sleep apnea    Tinea barbae     Past Surgical History:  Procedure Laterality Date   BREATH TEK H PYLORI N/A 08/20/2013   Procedure: BREATH TEK H PYLORI;  Surgeon: Azucena Bollard, MD;  Location: [facility];  Service: General;  Laterality: N/A;   COLONOSCOPY WITH PROPOFOL  N/A 05/05/2022   Procedure: COLONOSCOPY WITH PROPOFOL ;  Surgeon: Nannette Babe, MD;  Location: [facility];  Service: Gastroenterology;  Laterality: N/A;   CRANIOTOMY Right 12/18/2023   Procedure: Right Frontal Stereotactic Craniotomy for Resection of Tumor;   Surgeon: Van Gelinas, MD;  Location: [facility];  Service: Neurosurgery;  Laterality: Right;   ESOPHAGOGASTRODUODENOSCOPY (EGD) WITH PROPOFOL  N/A 03/15/2023   Procedure: ESOPHAGOGASTRODUODENOSCOPY (EGD) WITH PROPOFOL ;  Surgeon: Kenney Peacemaker, MD;  Location: [facility];  Service: Gastroenterology;  Laterality: N/A;   LAPAROSCOPIC APPENDECTOMY  2007   Dr Alethea Andes   LAPAROSCOPIC GASTRIC SLEEVE RESECTION N/A 10/08/2013   Procedure: LAPAROSCOPIC SLEEVE GASTRECTOMY;  Surgeon: Azucena Bollard, MD;  Location: [facility];  Service: General;  Laterality: N/A;   LUMBAR LAMINECTOMY/DECOMPRESSION MICRODISCECTOMY N/A 09/16/2015   Procedure: MICRO LUMBAR DECOMPRESSION, MICRODISCECTOMY L5 - S1;  Surgeon: Orvan Blanch, MD;  Location: [facility];  Service: Orthopedics;  Laterality: N/A;   POLYPECTOMY  05/05/2022   Procedure: POLYPECTOMY;  Surgeon: Nannette Babe, MD;  Location: [facility];  Service: Gastroenterology;;   ROTATOR CUFF REPAIR Right 2005   Dr Julio Ohm    Social History   Socioeconomic History   Marital status: Married    Spouse name: Ygnacio Radek   Number of children: 3   Years of education: Not on file   Highest education level: 12th grade  Occupational History   Occupation: Doctor, general practice PD    Employer: UNEMPLOYED  Tobacco Use   Smoking status: Never    Passive exposure: Never   Smokeless tobacco: Never  Vaping Use   Vaping status: Never Used  Substance and Sexual Activity   Alcohol  use: No   Drug use: No   Sexual activity: Not on  file  Other Topics Concern   Not on file  Social History Narrative   Married 18+ years   3 children - ages approx 86, 78 and 5...daughter age 31 w/ DM type 1   Social Drivers of Corporate investment banker Strain: Low Risk  (04/02/2023)   Overall Financial Resource Strain (CARDIA)    Difficulty of Paying Living Expenses: Not hard at all  Food Insecurity: No Food Insecurity (12/18/2023)   Hunger Vital Sign    Worried About Running Out of Food in the Last Year: Never  true    Ran Out of Food in the Last Year: Never true  Transportation Needs: No Transportation Needs (12/18/2023)   PRAPARE - Administrator, Civil Service (Medical): No    Lack of Transportation (Non-Medical): No  Physical Activity: Insufficiently Active (04/02/2023)   Exercise Vital Sign    Days of Exercise per Week: 4 days    Minutes of Exercise per Session: 30 min  Stress: No Stress Concern Present (04/02/2023)   [employer] of Occupational Health - Occupational Stress Questionnaire    Feeling of Stress : Not at all  Social Connections: Socially Integrated (04/02/2023)   Social Connection and Isolation Panel [NHANES]    Frequency of Communication with Friends and Family: More than three times a week    Frequency of Social Gatherings with Friends and Family: Once a week    Attends Religious Services: More than 4 times per year    Active Member of [employer] or Organizations: Yes    Attends Engineer, structural: More than 4 times per year    Marital Status: Married     FAMILY HISTORY:  Nicholas Stevens maternal great uncle was diagnosed with an unknown type of cancer later in life, he is deceased. Nicholas Stevens is unaware of previous family history of genetic testing for hereditary cancer risks. There is no reported Ashkenazi Jewish ancestry.      GENETIC COUNSELING ASSESSMENT: Nicholas Stevens is a 53 y.o. male with a personal history of cancer which is somewhat suggestive of a hereditary predisposition to cancer. We, therefore, discussed and recommended the following at today's visit.   DISCUSSION: We discussed that 5 - 10% of cancer is hereditary, with some cases of glioblastoma associated with Li-Fraumeni or Lynch Syndrome.  There are other genes that can be associated with hereditary brain cancer syndromes.  We discussed that testing is beneficial for several reasons including knowing how to follow individuals after completing their treatment and understanding if other  family members could be at risk for cancer and allowing them to undergo genetic testing.   We reviewed the characteristics, features and inheritance patterns of hereditary cancer syndromes. We also discussed genetic testing, including the appropriate family members to test, the process of testing, insurance coverage and turn-around-time for results. We discussed the implications of a negative, positive, carrier and/or variant of uncertain significant result. We recommended Nicholas Stevens pursue genetic testing for a panel that includes genes associated with brain cancer.   Nicholas Stevens  was offered a common hereditary cancer panel (40 genes+ CNS genes) and an expanded pan-cancer panel (77 genes). Nicholas Stevens was informed of the benefits and limitations of each panel, including that expanded pan-cancer panels contain genes that do not have clear management guidelines at this point in time.  We also discussed that as the number of genes included on a panel increases, the chances of variants of uncertain significance increases. After considering the benefits  and limitations of each gene panel, Nicholas Stevens elected to have [facility] Custom[facility].  The [facility] CustomNext+RNAinsight Panel (CancerNext+ CNS) includes sequencing, rearrangement analysis, and RNA analysis for the following 54 genes: AIP, ALK, APC, ATM, AXIN2, BAP1, BARD1, BMPR1A, BRCA1, BRCA2, BRIP1, CDH1, CDKN1B, CDKN2A, CHEK2, DICER1, EPCAM, FH, FLCN, GREM1, HOXB13, MBD4, MEN1, MET, MLH1, MSH2, MSH3, MSH6, MUTYH, NF1, NF2, NTHL1, PALB2, PHOX2B, PMS2, POLD1, POLE, PRKAR1A, PTCH1, PTEN, RAD51C, RAD51D, RB1, RPS20, SMAD4, SMARCA4, SMARCB1, SMARCE1, STK11, SUFU, TP53, TSC1, TSC2, VHL  Based on Nicholas Stevens personal history of cancer, he does not meet medical criteria for genetic testing. He may have an out of pocket cost. We discussed that if his out of pocket cost for testing is over $100, the laboratory should contact them to discuss self-pay prices, patient pay  assistance programs, if applicable, and other billing options.  PLAN: After considering the risks, benefits, and limitations, Nicholas Stevens provided informed consent to pursue genetic testing and the blood sample was sent to [facility] for analysis of the CustomNext Panel. Results should be available within approximately 2-3 weeks' time, at which point they will be disclosed by telephone to Nicholas Stevens, as will any additional recommendations warranted by these results. Nicholas Stevens will receive a summary of his genetic counseling visit and a copy of his results once available. This information will also be available in Epic.   Nicholas Stevens questions were answered to his satisfaction today. Our contact information was provided should additional questions or concerns arise. Thank you for the referral and allowing us  to share in the care of your patient.   Pride Gonzales, MS, [facility] Genetic Counselor [email or web address] (P) [phone]  40 minutes were spent on the date of the encounter in service to the patient including preparation, face-to-face consultation, documentation and care coordination.  The patient brought his wife. Drs. Stevens and/or Maryalice Smaller were available to discuss this case as needed.   _______________________________________________________________________ For Office Staff:  Number of people involved in session: 2 Was an Intern/ student involved with case: no

## 2024-04-29 ENCOUNTER — Inpatient Hospital Stay

## 2024-04-29 ENCOUNTER — Inpatient Hospital Stay (HOSPITAL_BASED_OUTPATIENT_CLINIC_OR_DEPARTMENT_OTHER): Admitting: Internal Medicine

## 2024-04-29 VITALS — BP 126/92 | HR 57 | Temp 97.7°F | Resp 18 | Wt 291.2 lb

## 2024-04-29 DIAGNOSIS — C711 Malignant neoplasm of frontal lobe: Secondary | ICD-10-CM

## 2024-04-29 DIAGNOSIS — G40909 Epilepsy, unspecified, not intractable, without status epilepticus: Secondary | ICD-10-CM | POA: Diagnosis not present

## 2024-04-29 LAB — CMP (CANCER CENTER ONLY)
ALT: 39 U/L (ref 0–44)
AST: 16 U/L (ref 15–41)
Albumin: 4.1 g/dL (ref 3.5–5.0)
Alkaline Phosphatase: 38 U/L (ref 38–126)
Anion gap: 5 (ref 5–15)
BUN: 21 mg/dL — ABNORMAL HIGH (ref 6–20)
CO2: 26 mmol/L (ref 22–32)
Calcium: 9.4 mg/dL (ref 8.9–10.3)
Chloride: 105 mmol/L (ref 98–111)
Creatinine: 1.01 mg/dL (ref 0.61–1.24)
GFR, Estimated: >60 mL/min (ref >60)
Glucose, Bld: 86 mg/dL (ref 70–99)
Potassium: 3.8 mmol/L (ref 3.5–5.1)
Sodium: 136 mmol/L (ref 135–145)
Total Bilirubin: 0.7 mg/dL (ref 0.0–1.2)
Total Protein: 6.8 g/dL (ref 6.5–8.1)

## 2024-04-29 LAB — CBC WITH DIFFERENTIAL (CANCER CENTER ONLY)
Abs Immature Granulocytes: 0.1 10*3/uL — ABNORMAL HIGH (ref 0.00–0.07)
Basophils Absolute: 0 10*3/uL (ref 0.0–0.1)
Basophils Relative: 0 %
Eosinophils Absolute: 0 10*3/uL (ref 0.0–0.5)
Eosinophils Relative: 1 %
HCT: 53.2 % — ABNORMAL HIGH (ref 39.0–52.0)
Hemoglobin: 17.7 g/dL — ABNORMAL HIGH (ref 13.0–17.0)
Immature Granulocytes: 1 %
Lymphocytes Relative: 12 %
Lymphs Abs: 0.9 10*3/uL (ref 0.7–4.0)
MCH: 27.4 pg (ref 26.0–34.0)
MCHC: 33.3 g/dL (ref 30.0–36.0)
MCV: 82.5 fL (ref 80.0–100.0)
Monocytes Absolute: 0.8 10*3/uL (ref 0.1–1.0)
Monocytes Relative: 10 %
Neutro Abs: 5.6 10*3/uL (ref 1.7–7.7)
Neutrophils Relative %: 76 %
Platelet Count: 217 10*3/uL (ref 150–400)
RBC: 6.45 MIL/uL — ABNORMAL HIGH (ref 4.22–5.81)
RDW: 17.7 % — ABNORMAL HIGH (ref 11.5–15.5)
WBC Count: 7.4 10*3/uL (ref 4.0–10.5)
nRBC: 0 % (ref 0.0–0.2)

## 2024-04-29 MED ORDER — TEMOZOLOMIDE 250 MG PO CAPS: 200.0000 mg/m2/d | ORAL_CAPSULE | Freq: Every day | ORAL | 0 refills | Status: DC

## 2024-04-29 MED ORDER — DEXAMETHASONE 1 MG PO TABS: 1.0000 mg | ORAL_TABLET | Freq: Two times a day (BID) | ORAL | 1 refills | Status: DC

## 2024-04-29 MED ORDER — APIXABAN 5 MG PO TABS: 5.0000 mg | ORAL_TABLET | Freq: Two times a day (BID) | ORAL | 3 refills | Status: DC

## 2024-04-29 NOTE — Progress Notes (Signed)
 [facility] [address]  [place], [place] [address] [phone]   Interval Evaluation  Date of Service: 04/29/24 Patient Name: Nicholas Stevens Patient MRN: [id number] Patient DOB: 1971-11-25 Provider: Mamie Searles, MD  Identifying Statement:  Nicholas Stevens is a 53 y.o. male with right frontal glioblastoma    Oncologic History: Oncology History  Frontal glioblastoma (HCC)  12/18/2023 Surgery   Craniotomy, resection of right frontal mass with Dr. Andy Bannister; path is glioblastoma, IDH pending   01/22/2024 -  Chemotherapy   Patient is on Treatment Plan : BRAIN GLIOBLASTOMA Radiation Therapy With Concurrent Temozolomide  75 mg/m2 Daily Followed By Sequential Maintenance Temozolomide  x 6-12 cycles         Interval History: SHOTA DUREL presents today following cycle #1 of 5-day Temodar .  Tolerated treatment well overall.  No new or progressive changes.  He continues to dose decadron  2mg  daily, increased from prior 1mg .  No further seizures, denies headaches.  Maintains functional independence with baseline fatigue and "shakiness".  Prior- presents for follow up after recent clinical changes.  He describes relatively rapid onset of left sided weakness starting ~10 days ago.  He was unable to walk and use the left arm meaningfully.  No seizure activity appreciated.  Decadron  was started 4mg  twice per day after 3-4 days of the weakness.  Once the steroid was started, he returned back to his prior baseline.  Continues on the Keppra .  Denies severe headaches.   H+P (01/08/24) Patient presented with several days of left sided clumsiness, impaired coordination.  Was found to have large right frontal mass, c/w primary brain tumor.  He underwent craniotomy, resection with Dr. Andy Bannister on 12/18/23; path demonstrated glioblastoma.  Following surgery, he had persistent right sided weakness, he is currently in outpatient PT following discharge  from rehab.  No seizures, headaches.     Medications: Current Outpatient Medications on File Prior to Visit  Medication Sig Dispense Refill   apixaban  (ELIQUIS ) 5 MG TABS tablet Take 1 tablet (5 mg total) by mouth 2 (two) times daily. 60 tablet 0   camphor-menthol  (SARNA) lotion Apply topically as needed for itching. 222 mL 0   Cyanocobalamin  (B-12 PO) Take 1 tablet by mouth daily.     fluconazole  (DIFLUCAN ) 100 MG tablet Take 2 tabs on day#1, then 1 tab daily on Days #2-10 11 tablet 1   latanoprost  (XALATAN ) 0.005 % ophthalmic solution Place 1 drop into both eyes at bedtime.     levETIRAcetam  (KEPPRA ) 500 MG tablet Take 1 tablet (500 mg total) by mouth 2 (two) times daily. 60 tablet 3   magic mouthwash (nystatin , lidocaine , diphenhydrAMINE , alum & mag hydroxide) suspension Swish and swallow 5 mLs by mouth 4 (four) times daily as needed for mouth pain. 140 mL 1   Multiple Vitamin (MULTIVITAMIN WITH MINERALS) TABS tablet Take 1 tablet by mouth daily.     Omega-3 Fatty Acids (FISH OIL) 1000 MG CAPS Take 1,000 mg by mouth daily.     ondansetron  (ZOFRAN ) 8 MG tablet Take 1 tablet (8 mg total) by mouth every 8 (eight) hours as needed for nausea or vomiting. May take 30-60 minutes prior to Temodar  administration if nausea/vomiting occurs as needed. 30 tablet 1   pantoprazole  (PROTONIX ) 40 MG tablet Take 1 tablet (40 mg total) by mouth 2 (two) times daily. 180 tablet 3   pimecrolimus  (ELIDEL ) 1 % cream Apply 1 Application topically 2 (two) times daily as needed (Dermatitis).  senna-docusate (SENOKOT-S) 8.6-50 MG tablet Take 2 tablets by mouth daily at 6 (six) AM. 60 tablet 0   triamcinolone  cream (KENALOG ) 0.1 % Apply 1 Application topically daily. Use on affected areas for 2 weeks, then stop for 2 weeks. Repeat until clear if needed. 453 g 0   zolpidem  (AMBIEN ) 10 MG tablet TAKE 1 TABLET BY MOUTH EVERYDAY AT BEDTIME 90 tablet 1   No current facility-administered medications on file prior to visit.     Allergies: No Known Allergies Past Medical History:  Past Medical History:  Diagnosis Date   DDD (degenerative disc disease), lumbar    HNP (herniated nucleus pulposus)    Hypertension    Liver hemangioma    Morbid obesity (HCC)    OSA (obstructive sleep apnea)    no cpap used since weight loss   Overweight(278.02)    Plantar fasciitis of right foot    Sleep apnea    Tinea barbae    Past Surgical History:  Past Surgical History:  Procedure Laterality Date   BREATH TEK H PYLORI N/A 08/20/2013   Procedure: BREATH TEK H PYLORI;  Surgeon: Azucena Bollard, MD;  Location: [facility];  Service: General;  Laterality: N/A;   COLONOSCOPY WITH PROPOFOL  N/A 05/05/2022   Procedure: COLONOSCOPY WITH PROPOFOL ;  Surgeon: Nannette Babe, MD;  Location: [facility];  Service: Gastroenterology;  Laterality: N/A;   CRANIOTOMY Right 12/18/2023   Procedure: Right Frontal Stereotactic Craniotomy for Resection of Tumor;  Surgeon: Van Gelinas, MD;  Location: [facility] OR;  Service: Neurosurgery;  Laterality: Right;   ESOPHAGOGASTRODUODENOSCOPY (EGD) WITH PROPOFOL  N/A 03/15/2023   Procedure: ESOPHAGOGASTRODUODENOSCOPY (EGD) WITH PROPOFOL ;  Surgeon: Kenney Peacemaker, MD;  Location: [facility];  Service: Gastroenterology;  Laterality: N/A;   LAPAROSCOPIC APPENDECTOMY  2007   Dr Alethea Andes   LAPAROSCOPIC GASTRIC SLEEVE RESECTION N/A 10/08/2013   Procedure: LAPAROSCOPIC SLEEVE GASTRECTOMY;  Surgeon: Azucena Bollard, MD;  Location: [facility];  Service: General;  Laterality: N/A;   LUMBAR LAMINECTOMY/DECOMPRESSION MICRODISCECTOMY N/A 09/16/2015   Procedure: MICRO LUMBAR DECOMPRESSION, MICRODISCECTOMY L5 - S1;  Surgeon: Orvan Blanch, MD;  Location: [facility];  Service: Orthopedics;  Laterality: N/A;   POLYPECTOMY  05/05/2022   Procedure: POLYPECTOMY;  Surgeon: Nannette Babe, MD;  Location: [facility];  Service: Gastroenterology;;   ROTATOR CUFF REPAIR Right 2005   Dr Julio Ohm   Social History:  Social History    Socioeconomic History   Marital status: Married    Spouse name: Robroy Chevez   Number of children: 3   Years of education: Not on file   Highest education level: 12th grade  Occupational History   Occupation: Doctor, general practice PD    Employer: UNEMPLOYED  Tobacco Use   Smoking status: Never    Passive exposure: Never   Smokeless tobacco: Never  Vaping Use   Vaping status: Never Used  Substance and Sexual Activity   Alcohol  use: No   Drug use: No   Sexual activity: Not on file  Other Topics Concern   Not on file  Social History Narrative   Married 18+ years   3 children - ages approx 75, 9 and 5...daughter age 24 w/ DM type 1   Social Drivers of Corporate investment banker Strain: Low Risk  (04/02/2023)   Overall Financial Resource Strain (CARDIA)    Difficulty of Paying Living Expenses: Not hard at all  Food Insecurity: No Food Insecurity (12/18/2023)   Hunger Vital Sign    Worried About Running  Out of Food in the Last Year: Never true    Ran Out of Food in the Last Year: Never true  Transportation Needs: No Transportation Needs (12/18/2023)   PRAPARE - Administrator, Civil Service (Medical): No    Lack of Transportation (Non-Medical): No  Physical Activity: Insufficiently Active (04/02/2023)   Exercise Vital Sign    Days of Exercise per Week: 4 days    Minutes of Exercise per Session: 30 min  Stress: No Stress Concern Present (04/02/2023)   [employer] of Occupational Health - Occupational Stress Questionnaire    Feeling of Stress : Not at all  Social Connections: Socially Integrated (04/02/2023)   Social Connection and Isolation Panel [NHANES]    Frequency of Communication with Friends and Family: More than three times a week    Frequency of Social Gatherings with Friends and Family: Once a week    Attends Religious Services: More than 4 times per year    Active Member of [employer] or Organizations: Yes    Attends Engineer, structural: More than 4 times per  year    Marital Status: Married  Catering manager Violence: Not At Risk (12/18/2023)   Humiliation, Afraid, Rape, and Kick questionnaire    Fear of Current or Ex-Partner: No    Emotionally Abused: No    Physically Abused: No    Sexually Abused: No   Family History:  Family History  Problem Relation Age of Onset   Other Mother        hx of DJD   Liver disease Father        ETOH, Hep C   Hypertension Father    Colon cancer Neg Hx    Colon polyps Neg Hx    Esophageal cancer Neg Hx    Rectal cancer Neg Hx    Stomach cancer Neg Hx     Review of Systems: Constitutional: Doesn't report fevers, chills or abnormal weight loss Eyes: Doesn't report blurriness of vision Ears, nose, mouth, throat, and face: Doesn't report sore throat Respiratory: Doesn't report cough, dyspnea or wheezes Cardiovascular: Doesn't report palpitation, chest discomfort  Gastrointestinal:  Doesn't report nausea, constipation, diarrhea GU: Doesn't report incontinence Skin: Doesn't report skin rashes Neurological: Per HPI Musculoskeletal: Doesn't report joint pain Behavioral/Psych: Doesn't report anxiety  Physical Exam: Vitals:   04/29/24 1146  BP: (!) 126/92  Pulse: (!) 57  Resp: 18  Temp: 97.7 F (36.5 C)  SpO2: 99%    KPS: 80. General: Alert, cooperative, pleasant, in no acute distress Head: Normal EENT: No conjunctival injection or scleral icterus.  Lungs: Resp effort normal Cardiac: Regular rate Abdomen: Non-distended abdomen Skin: No rashes cyanosis or petechiae. Extremities: No clubbing or edema  Neurologic Exam: Mental Status: Awake, alert, attentive to examiner. Oriented to self and environment. Language is fluent with intact comprehension.  Cranial Nerves: Visual acuity is grossly normal. Visual fields are full. Extra-ocular movements intact. No ptosis. Face is symmetric Motor: Tone and bulk are normal. Power is 4+/5 in left arm and leg. Reflexes are symmetric, no pathologic reflexes  present.  Sensory: Intact to light touch Gait: Normal.   Labs: I have reviewed the data as listed    Component Value Date/Time   NA 136 04/29/2024 1049   K 3.8 04/29/2024 1049   CL 105 04/29/2024 1049   CO2 26 04/29/2024 1049   GLUCOSE 86 04/29/2024 1049   BUN 21 (H) 04/29/2024 1049   CREATININE 1.01 04/29/2024 1049   CALCIUM 9.4  04/29/2024 1049   PROT 6.8 04/29/2024 1049   ALBUMIN 4.1 04/29/2024 1049   AST 16 04/29/2024 1049   ALT 39 04/29/2024 1049   ALKPHOS 38 04/29/2024 1049   BILITOT 0.7 04/29/2024 1049   GFRNONAA >60 04/29/2024 1049   GFRAA >60 09/15/2015 1520   Lab Results  Component Value Date   WBC 7.4 04/29/2024   NEUTROABS 5.6 04/29/2024   HGB 17.7 (H) 04/29/2024   HCT 53.2 (H) 04/29/2024   MCV 82.5 04/29/2024   PLT 217 04/29/2024     Assessment/Plan Frontal glioblastoma (HCC)  Seizure disorder (HCC)  Dontea W Sydney is clinically stable today, now having completed cycle #1 of 5-day Temodar .  Labs are within normal limits.  We recommended initiating treatment with cycle #2 Temozolomide , dose increased to 200 mg/m2, on for five days and off for twenty three days in twenty eight day cycles. The patient will have a complete blood count performed on days 21 and 28 of each cycle, and a comprehensive metabolic panel performed on day 28 of each cycle. Labs may need to be performed more often. Zofran  will prescribed for home use for nausea/vomiting.   Informed consent was obtained verbally at bedside to proceed with oral chemotherapy.  Chemotherapy should be held for the following:  ANC less than 1,000  Platelets less than 100,000  LFT or creatinine greater than 2x ULN  If clinical concerns/contraindications develop  Recommended continuing decadron  2mg  daily for now.  Recommended continuing Keppra  500mg  BID for seizure prevention.  We ask that [employer] return to clinic in 4 weeks with MRI brain prior to cycle #3, or sooner if  needed.  All questions were answered. The patient knows to call the clinic with any problems, questions or concerns. No barriers to learning were detected.  The total time spent in the encounter was 30 minutes and more than 50% was on counseling and review of test results   Mamie Searles, MD Medical Director of Neuro-Oncology [facility] 04/29/24 11:50 AM

## 2024-04-30 ENCOUNTER — Ambulatory Visit: Payer: Commercial Managed Care - PPO | Admitting: Internal Medicine

## 2024-04-30 ENCOUNTER — Encounter: Payer: Self-pay | Admitting: Internal Medicine

## 2024-04-30 ENCOUNTER — Telehealth: Payer: Self-pay | Admitting: *Deleted

## 2024-04-30 VITALS — BP 122/84 | HR 72 | Temp 98.7°F | Ht 74.0 in | Wt 291.8 lb

## 2024-04-30 DIAGNOSIS — I824Z9 Acute embolism and thrombosis of unspecified deep veins of unspecified distal lower extremity: Secondary | ICD-10-CM

## 2024-04-30 DIAGNOSIS — C711 Malignant neoplasm of frontal lobe: Secondary | ICD-10-CM

## 2024-04-30 DIAGNOSIS — G4733 Obstructive sleep apnea (adult) (pediatric): Secondary | ICD-10-CM | POA: Diagnosis not present

## 2024-04-30 DIAGNOSIS — R635 Abnormal weight gain: Secondary | ICD-10-CM

## 2024-04-30 DIAGNOSIS — K219 Gastro-esophageal reflux disease without esophagitis: Secondary | ICD-10-CM | POA: Diagnosis not present

## 2024-04-30 MED ORDER — WEGOVY 0.25 MG/0.5ML ~~LOC~~ SOAJ: 0.2500 mg | SUBCUTANEOUS | 2 refills | Status: DC

## 2024-04-30 MED ORDER — TRIAMTERENE-HCTZ 37.5-25 MG PO TABS: 1.0000 | ORAL_TABLET | Freq: Every day | ORAL | 3 refills | Status: DC

## 2024-04-30 MED ORDER — PANTOPRAZOLE SODIUM 40 MG PO TBEC: 40.0000 mg | DELAYED_RELEASE_TABLET | Freq: Two times a day (BID) | ORAL | 3 refills | Status: DC

## 2024-04-30 MED ORDER — OLMESARTAN MEDOXOMIL 20 MG PO TABS: 20.0000 mg | ORAL_TABLET | Freq: Every day | ORAL | 3 refills | Status: AC

## 2024-04-30 NOTE — Assessment & Plan Note (Signed)
 On Decadrone Re-start Wegovy  w/caution

## 2024-04-30 NOTE — Progress Notes (Signed)
 Subjective:  Patient ID: Nicholas Stevens, male    DOB: [birth year]  Age: 53 y.o. MRN: [id number]  CC: Medical Management of Chronic Issues (3 Month Follow Up. Patient concerned with hypertension. During his oncology appointments BP showing 160-170/90. Not currently checking regularly at home. Wants to know if this is stress related hypertension or medication related)   HPI Nicholas Stevens presents for elevated BP, HTN, glioblastoma - wt gain on Decadron   Outpatient Medications Prior to Visit  Medication Sig Dispense Refill   apixaban  (ELIQUIS ) 5 MG TABS tablet Take 1 tablet (5 mg total) by mouth 2 (two) times daily. 60 tablet 3   camphor-menthol  (SARNA) lotion Apply topically as needed for itching. 222 mL 0   Cyanocobalamin  (B-12 PO) Take 1 tablet by mouth daily.     dexamethasone  (DECADRON ) 1 MG tablet Take 1 tablet (1 mg total) by mouth 2 (two) times daily with a meal. 60 tablet 1   fluconazole  (DIFLUCAN ) 100 MG tablet Take 2 tabs on day#1, then 1 tab daily on Days #2-10 11 tablet 1   latanoprost  (XALATAN ) 0.005 % ophthalmic solution Place 1 drop into both eyes at bedtime.     levETIRAcetam  (KEPPRA ) 500 MG tablet Take 1 tablet (500 mg total) by mouth 2 (two) times daily. 60 tablet 3   magic mouthwash (nystatin , lidocaine , diphenhydrAMINE , alum & mag hydroxide) suspension Swish and swallow 5 mLs by mouth 4 (four) times daily as needed for mouth pain. 140 mL 1   Multiple Vitamin (MULTIVITAMIN WITH MINERALS) TABS tablet Take 1 tablet by mouth daily.     Omega-3 Fatty Acids (FISH OIL) 1000 MG CAPS Take 1,000 mg by mouth daily.     ondansetron  (ZOFRAN ) 8 MG tablet Take 1 tablet (8 mg total) by mouth every 8 (eight) hours as needed for nausea or vomiting. May take 30-60 minutes prior to Temodar  administration if nausea/vomiting occurs as needed. 30 tablet 1   pimecrolimus  (ELIDEL ) 1 % cream Apply 1 Application topically 2 (two) times daily as needed (Dermatitis).     senna-docusate  (SENOKOT-S) 8.6-50 MG tablet Take 2 tablets by mouth daily at 6 (six) AM. 60 tablet 0   temozolomide  (TEMODAR ) 250 MG capsule Take 2 capsules (500 mg total) by mouth daily. (Take with one 180 mg capsule for total daily dose of 380 mg). Take for 5 days on, 23 days off. Repeat every 28 days. May take on an empty stomach to decrease nausea & vomiting. 10 capsule 0   triamcinolone  cream (KENALOG ) 0.1 % Apply 1 Application topically daily. Use on affected areas for 2 weeks, then stop for 2 weeks. Repeat until clear if needed. 453 g 0   zolpidem  (AMBIEN ) 10 MG tablet TAKE 1 TABLET BY MOUTH EVERYDAY AT BEDTIME 90 tablet 1   pantoprazole  (PROTONIX ) 40 MG tablet Take 1 tablet (40 mg total) by mouth 2 (two) times daily. 180 tablet 3   No facility-administered medications prior to visit.    ROS: Review of Systems  Constitutional:  Positive for unexpected weight change. Negative for appetite change and fatigue.  HENT:  Negative for congestion, nosebleeds, sneezing, sore throat and trouble swallowing.   Eyes:  Negative for itching and visual disturbance.  Respiratory:  Negative for cough.   Cardiovascular:  Negative for chest pain, palpitations and leg swelling.  Gastrointestinal:  Negative for abdominal distention, abdominal pain, blood in stool, diarrhea, nausea and vomiting.  Genitourinary:  Negative for frequency and hematuria.  Musculoskeletal:  Negative for back  pain, gait problem, joint swelling and neck pain.  Skin:  Negative for rash.  Neurological:  Negative for dizziness, tremors, speech difficulty and weakness.  Psychiatric/Behavioral:  Negative for agitation, dysphoric mood and sleep disturbance. The patient is not nervous/anxious.     Objective:  BP 122/84   Pulse 72   Temp 98.7 F (37.1 C)   Ht 6\' 2"  (1.88 m)   Wt 291 lb 12.8 oz (132.4 kg)   SpO2 96%   BMI 37.46 kg/m   BP Readings from Last 3 Encounters:  04/30/24 122/84  04/29/24 (!) 126/92  04/26/24 (!) 142/100    Wt  Readings from Last 3 Encounters:  04/30/24 291 lb 12.8 oz (132.4 kg)  04/29/24 291 lb 3.2 oz (132.1 kg)  03/25/24 286 lb 4.8 oz (129.9 kg)    Physical Exam Constitutional:      General: He is not in acute distress.    Appearance: He is well-developed. He is obese.     Comments: NAD  Eyes:     Conjunctiva/sclera: Conjunctivae normal.     Pupils: Pupils are equal, round, and reactive to light.  Neck:     Thyroid : No thyromegaly.     Vascular: No JVD.  Cardiovascular:     Rate and Rhythm: Normal rate and regular rhythm.     Heart sounds: Normal heart sounds. No murmur heard.    No friction rub. No gallop.  Pulmonary:     Effort: Pulmonary effort is normal. No respiratory distress.     Breath sounds: Normal breath sounds. No wheezing or rales.  Chest:     Chest wall: No tenderness.  Abdominal:     General: Bowel sounds are normal. There is no distension.     Palpations: Abdomen is soft. There is no mass.     Tenderness: There is no abdominal tenderness. There is no guarding or rebound.  Musculoskeletal:        General: No tenderness. Normal range of motion.     Cervical back: Normal range of motion.  Lymphadenopathy:     Cervical: No cervical adenopathy.  Skin:    General: Skin is warm and dry.     Findings: No rash.  Neurological:     Mental Status: He is alert and oriented to person, place, and time.     Cranial Nerves: No cranial nerve deficit.     Motor: No abnormal muscle tone.     Coordination: Coordination normal.     Gait: Gait normal.     Deep Tendon Reflexes: Reflexes are normal and symmetric.  Psychiatric:        Behavior: Behavior normal.        Thought Content: Thought content normal.        Judgment: Judgment normal.     Lab Results  Component Value Date   WBC 7.4 04/29/2024   HGB 17.7 (H) 04/29/2024   HCT 53.2 (H) 04/29/2024   PLT 217 04/29/2024   GLUCOSE 86 04/29/2024   CHOL 171 01/19/2023   TRIG 117.0 01/19/2023   HDL 29.10 (L) 01/19/2023    LDLDIRECT 116.0 10/26/2020   LDLCALC 119 (H) 01/19/2023   ALT 39 04/29/2024   AST 16 04/29/2024   NA 136 04/29/2024   K 3.8 04/29/2024   CL 105 04/29/2024   CREATININE 1.01 04/29/2024   BUN 21 (H) 04/29/2024   CO2 26 04/29/2024   TSH 0.715 03/12/2023   PSA 1.01 01/19/2023   INR 1.0 12/15/2023   HGBA1C 5.4  12/15/2023    No results found.  Assessment & Plan:   Problem List Items Addressed This Visit     OSA on CPAP   On CPAP      Frontal glioblastoma (HCC) - Primary   S/p XRT x 6 wks, oral chemo. On steroids  S/p craniotomy with resection of frontal glioblastoma; Dr. Andy Bannister, glioblastoma, 12/18/23 Keppra  was stopped 01/08/24 seizure re-occurred.Aaron AasAaron AasAaron AasOn Keppra        DVT, lower extremity, distal (HCC)   On Eliquis       Relevant Medications   triamterene -hydrochlorothiazide  (MAXZIDE -25) 37.5-25 MG tablet   olmesartan (BENICAR) 20 MG tablet   GERD (gastroesophageal reflux disease)   On Protonix  40 mg bid      Relevant Medications   pantoprazole  (PROTONIX ) 40 MG tablet   Weight gain   On Decadrone Re-start Wegovy  w/caution         Meds ordered this encounter  Medications   triamterene -hydrochlorothiazide  (MAXZIDE -25) 37.5-25 MG tablet    Sig: Take 1 tablet by mouth daily.    Dispense:  90 tablet    Refill:  3   pantoprazole  (PROTONIX ) 40 MG tablet    Sig: Take 1 tablet (40 mg total) by mouth 2 (two) times daily.    Dispense:  180 tablet    Refill:  3   olmesartan (BENICAR) 20 MG tablet    Sig: Take 1 tablet (20 mg total) by mouth daily.    Dispense:  90 tablet    Refill:  3   Semaglutide -Weight Management (WEGOVY ) 0.25 MG/0.5ML SOAJ    Sig: Inject 0.25 mg into the skin once a week.    Dispense:  2 mL    Refill:  2      Follow-up: Return in about 3 months (around 07/31/2024) for a follow-up visit.  Anitra Barn, MD

## 2024-04-30 NOTE — Assessment & Plan Note (Signed)
 On CPAP. ?

## 2024-04-30 NOTE — Assessment & Plan Note (Signed)
 On Protonix  40 mg bid

## 2024-04-30 NOTE — Assessment & Plan Note (Signed)
 On Eliquis

## 2024-04-30 NOTE — Telephone Encounter (Signed)
 Medication Clarification Request received from [facility] regarding patient's temodar  rx - per Dr Mark Sil, new temodar  dose will be 500 mg for 5 days.  Form signed by Dr Mark Sil & faxed to [phone], fax confirmation received.

## 2024-04-30 NOTE — Assessment & Plan Note (Signed)
 S/p XRT x 6 wks, oral chemo. On steroids  S/p craniotomy with resection of frontal glioblastoma; Dr. Andy Bannister, glioblastoma, 12/18/23 Keppra  was stopped 01/08/24 seizure re-occurred.Nicholas AasAaron AasAaron AasOn Keppra 

## 2024-05-01 ENCOUNTER — Ambulatory Visit: Admitting: Physical Therapy

## 2024-05-01 ENCOUNTER — Telehealth: Payer: Self-pay | Admitting: Internal Medicine

## 2024-05-01 NOTE — Telephone Encounter (Signed)
 Patient scheduled appointments. Patient is aware of all appointment details.

## 2024-05-02 ENCOUNTER — Telehealth: Payer: Self-pay

## 2024-05-02 ENCOUNTER — Other Ambulatory Visit (HOSPITAL_COMMUNITY): Payer: Self-pay

## 2024-05-02 ENCOUNTER — Other Ambulatory Visit: Payer: Self-pay

## 2024-05-02 NOTE — Telephone Encounter (Signed)
 Pharmacy Patient Advocate Encounter   Received notification from Patient Pharmacy that prior authorization for Wegovy  is required/requested.   Insurance verification completed.   The patient is insured through [employer] .   Per test claim: PA required; PA submitted to above mentioned insurance via CoverMyMeds Key/confirmation #/EOC [id number] Status is pending

## 2024-05-03 NOTE — Telephone Encounter (Signed)
 Pharmacy Patient Advocate Encounter  Received notification from [facility] that Prior Authorization for Wegovy  has been DENIED.  Full denial letter will be uploaded to the media tab. See denial reason below.   PA #/Case ID/Reference #: [id number]

## 2024-05-06 ENCOUNTER — Telehealth: Payer: Self-pay | Admitting: Pharmacist

## 2024-05-06 DIAGNOSIS — C711 Malignant neoplasm of frontal lobe: Secondary | ICD-10-CM

## 2024-05-06 NOTE — Telephone Encounter (Signed)
 The patient lost a lot of weight on Wegovy .  She gained weight off Wegovy  because he had to use steroids.  Please resubmit PA Thanks

## 2024-05-06 NOTE — Telephone Encounter (Signed)
 Oral Chemotherapy Pharmacist Encounter   Notified by RN that patient had not heard from [facility] regarding next TMZ fill that had been sent in on 04/29/24.   Called [facility] and was notified they have tried to call and have also emailed patient. Representative was able to reach patient while I was on the phone and TMZ shipment has been set to deliver to patient's home tomorrow (05/07/24).  Jude Norton, PharmD, BCPS, BCOP Hematology/Oncology Clinical Pharmacist [facility] and [facility] Oral Chemotherapy Navigation Clinics [phone] 05/06/2024 3:50 PM

## 2024-05-07 ENCOUNTER — Encounter: Payer: Self-pay | Admitting: Internal Medicine

## 2024-05-09 ENCOUNTER — Other Ambulatory Visit: Payer: Self-pay

## 2024-05-09 ENCOUNTER — Ambulatory Visit: Admitting: Physical Therapy

## 2024-05-09 ENCOUNTER — Encounter: Payer: Self-pay | Admitting: Internal Medicine

## 2024-05-09 ENCOUNTER — Other Ambulatory Visit: Payer: Self-pay | Admitting: Internal Medicine

## 2024-05-09 ENCOUNTER — Telehealth: Payer: Self-pay | Admitting: *Deleted

## 2024-05-09 NOTE — Telephone Encounter (Signed)
 PC to patient, spoke with [name] regarding his concerns about not receiving his Temodar  - she states he received it on May 13 & began taking it that night.  I informed her that Dr Mark Sil will evaluate patient's neuropathy sx's at his upcoming appointment on June 2 unless he feels he needs to be seen sooner.  [name] verbalizes understanding.

## 2024-05-13 NOTE — Telephone Encounter (Signed)
 Information has been sent to clinical pharmacist for appeals review. It may take 5-7 days to prepare the necessary documentation to request the appeal from the insurance.

## 2024-05-13 NOTE — Telephone Encounter (Signed)
 The weight can probably be tracked in flowchart.  Thanks

## 2024-05-14 ENCOUNTER — Telehealth: Payer: Self-pay | Admitting: Pharmacist

## 2024-05-14 NOTE — Telephone Encounter (Signed)
 An E-Appeal has been submitted for Wegovy . Will advise when response is received, please be advised that most companies may take 30 days to make a decision. Appeal letter and supporting documentation have been uploaded and submitted via the [facility] website on 05/14/2024 @8 :11am.  Thank you, Dene Fines, PharmD Clinical Pharmacist  [place]  Direct Dial: [phone]

## 2024-05-15 ENCOUNTER — Other Ambulatory Visit: Payer: Self-pay

## 2024-05-15 NOTE — Telephone Encounter (Signed)
 Insurance has approved the appeal for Wegovy :    Thank you, Dene Fines, PharmD Clinical Pharmacist  [place]  Direct Dial: [phone]

## 2024-05-16 ENCOUNTER — Other Ambulatory Visit: Payer: Self-pay

## 2024-05-16 ENCOUNTER — Encounter: Payer: Self-pay | Admitting: Genetic Counselor

## 2024-05-16 ENCOUNTER — Telehealth: Payer: Self-pay | Admitting: Genetic Counselor

## 2024-05-16 DIAGNOSIS — Z1379 Encounter for other screening for genetic and chromosomal anomalies: Secondary | ICD-10-CM | POA: Insufficient documentation

## 2024-05-16 NOTE — Telephone Encounter (Signed)
 I attempted to contact Mr. Nicholas Stevens to discuss his genetic testing results (54 genes). I left a voicemail requesting he call me back at [phone].  Garrie Elenes, MS, [facility] Genetic Counselor [email or web address] (P) [phone]

## 2024-05-17 ENCOUNTER — Ambulatory Visit: Payer: Self-pay | Admitting: Genetic Counselor

## 2024-05-17 ENCOUNTER — Encounter: Payer: Self-pay | Admitting: Genetic Counselor

## 2024-05-17 ENCOUNTER — Telehealth: Payer: Self-pay | Admitting: Genetic Counselor

## 2024-05-17 DIAGNOSIS — Z1379 Encounter for other screening for genetic and chromosomal anomalies: Secondary | ICD-10-CM

## 2024-05-17 NOTE — Telephone Encounter (Signed)
 I contacted Nicholas Stevens to discuss his genetic testing results. [facility] CustomNext Panel+RNA identified a single pathogenic variant in the MUTYH gene (p.G396D). 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 Report date is 05/13/2024. Detailed clinic note to follow.  The test report has been scanned into EPIC and is located under the Molecular Pathology section of the Results Review tab.  A portion of the result report is included below for reference.   Nicholas Weimer, MS, [facility] Genetic Counselor [email or web address] (P) [phone]

## 2024-05-17 NOTE — Progress Notes (Signed)
 HPI:   Mr. Nicholas Stevens was previously seen in the [facility] due to a personal and family history of cancer and concerns regarding a hereditary predisposition to cancer. Please refer to our prior cancer genetics clinic note for more information regarding our discussion, assessment and recommendations, at the time. Mr. Nicholas Stevens recent genetic test results were disclosed to him, as were recommendations warranted by these results. These results and recommendations are discussed in more detail below.  CANCER HISTORY:  Oncology History  Frontal glioblastoma (HCC)  12/18/2023 Surgery   Craniotomy, resection of right frontal mass with Dr. Andy Bannister; path is glioblastoma, IDH pending   01/22/2024 -  Chemotherapy   Patient is on Treatment Plan : BRAIN GLIOBLASTOMA Radiation Therapy With Concurrent Temozolomide  75 mg/m2 Daily Followed By Sequential Maintenance Temozolomide  x 6-12 cycles       FAMILY HISTORY:  Mr. Nicholas Stevens maternal great uncle was diagnosed with an unknown type of cancer later in life, he is deceased. Mr. Nicholas Stevens is unaware of previous family history of genetic testing for hereditary cancer risks. There is no reported Ashkenazi Jewish ancestry.          GENETIC TEST RESULTS:  Mr. Nicholas Stevens tested positive for a single pathogenic variant (harmful genetic change) in the MUTYH gene, indicating he is a carrier of MUTYH-associated polyposis. Specifically, this variant is p.G396D.  The [facility] CustomNext+RNAinsight Panel (CancerNext+ CNS) includes sequencing, rearrangement analysis, and RNA analysis for the following 54 genes: AIP, ALK, APC, ATM, AXIN2, BAP1, BARD1, BMPR1A, BRCA1, BRCA2, BRIP1, CDH1, CDKN1B, CDKN2A, CHEK2, DICER1, EPCAM, FH, FLCN, GREM1, HOXB13, MBD4, MEN1, MET, MLH1, MSH2, MSH3, MSH6, MUTYH, NF1, NF2, NTHL1, PALB2, PHOX2B, PMS2, POLD1, POLE, PRKAR1A, PTCH1, PTEN, RAD51C, RAD51D, RB1, RPS20, SMAD4, SMARCA4, SMARCB1, SMARCE1, STK11, SUFU, TP53, TSC1, TSC2,  VHL  The test report has been scanned into EPIC and is located under the Molecular Pathology section of the Results Review tab.  A portion of the result report is included below for reference. Genetic testing reported out on 05/13/2024.          Clinical Information: Changes in the MUTYH gene are associated with MUTYH-associated Polyposis syndrome (MAP). MAP is a hereditary cancer condition in which patients have high risks for colorectal cancer due to a large number of adenomatous polyps in the gastrointestinal system. MAP is unique among the hereditary cancer syndromes in that it is a "recessive" condition. Most hereditary cancer conditions are "dominant", meaning the condition is present in patients with a mutation in one copy of the gene. Patients have MAP only if there are mutations in both of their copies of the MUTYH gene. Mr. Nicholas Stevens carries only one gene mutation in MUTYH, therefore, he is considered a to be a carrier (heterozygous) for MAP. It is estimated that 1% to 2% of the population has a mutation in one copy of the MUTYH gene. These individuals do not have MAP. There is currently no known increased risk for cancer in individuals who are carriers for MAP.  Implications for Family Members: Mr. Nicholas Stevens first degree relatives are at a 50% risk for also having inherited the MUTYH mutation. Family members may consider genetic testing for this familial pathogenic variant. As there are generally no childhood cancer risks associated with pathogenic variants in the MUTYH gene, individuals in the family are not recommended to have testing until they reach at least 55 years of age. They may contact our office at [phone] for more information or to schedule an appointment.  Complimentary testing  for the familial variant is available for 90 days from the report date.  Family members who live outside of the area are encouraged to find a genetic counselor in their area by visiting:  [email or web address].   Additional Information: Even though a pathogenic variant was not identified that explains the personal and family history of cancer, possible explanations for the cancer in the family may include: There may be no hereditary risk for cancer in the family. The cancers in Mr. Nicholas Stevens and/or his family may be due to other genetic or environmental factors. There may be a gene mutation in one of these genes that current testing methods cannot detect, but that chance is small. There could be another gene that has not yet been discovered, or that we have not yet tested, that is responsible for the cancer diagnoses in the family.   Therefore, it is important to remain in touch with cancer genetics in the future so that we can continue to offer Mr. Nicholas Stevens the most up to date genetic testing.   Follow-Up:  Cancer genetics is a rapidly advancing field and it is possible that new genetic tests will be appropriate for him and/or his family members in the future. We encouraged him to remain in contact with cancer genetics on an annual basis so we can update his personal and family histories and let him know of advances in cancer genetics that may benefit this family.   Our contact number was provided. Mr. Nicholas Stevens questions were answered to his satisfaction, and he knows he is welcome to call us  at anytime with additional questions or concerns.   Malon Branton, MS, [facility] Genetic Counselor [email or web address] (P) [phone]

## 2024-05-21 ENCOUNTER — Other Ambulatory Visit: Payer: Self-pay | Admitting: Internal Medicine

## 2024-05-21 ENCOUNTER — Ambulatory Visit: Payer: Commercial Managed Care - PPO | Admitting: Dermatology

## 2024-05-21 DIAGNOSIS — C711 Malignant neoplasm of frontal lobe: Secondary | ICD-10-CM

## 2024-05-23 ENCOUNTER — Other Ambulatory Visit: Payer: Self-pay

## 2024-05-23 ENCOUNTER — Encounter: Payer: Self-pay | Admitting: Internal Medicine

## 2024-05-27 ENCOUNTER — Ambulatory Visit: Admitting: Internal Medicine

## 2024-05-27 ENCOUNTER — Other Ambulatory Visit

## 2024-05-30 ENCOUNTER — Encounter: Payer: Self-pay | Admitting: Internal Medicine

## 2024-05-31 ENCOUNTER — Encounter: Payer: Self-pay | Admitting: Internal Medicine

## 2024-05-31 ENCOUNTER — Other Ambulatory Visit: Payer: Self-pay

## 2024-05-31 MED ORDER — BACLOFEN 5 MG PO TABS: 5.0000 mg | ORAL_TABLET | Freq: Four times a day (QID) | ORAL | 1 refills | Status: AC | PRN

## 2024-06-02 ENCOUNTER — Encounter: Payer: Self-pay | Admitting: Internal Medicine

## 2024-06-03 ENCOUNTER — Ambulatory Visit (HOSPITAL_COMMUNITY)
Admission: RE | Admit: 2024-06-03 | Discharge: 2024-06-03 | Disposition: A | Discharge status: Home / Self Care | Source: Ambulatory Visit | Attending: Internal Medicine | Admitting: Internal Medicine

## 2024-06-03 ENCOUNTER — Telehealth: Payer: Self-pay | Admitting: *Deleted

## 2024-06-03 DIAGNOSIS — C711 Malignant neoplasm of frontal lobe: Secondary | ICD-10-CM | POA: Insufficient documentation

## 2024-06-03 MED ORDER — GADOBUTROL 1 MMOL/ML IV SOLN
10.0000 mL | Freq: Once | INTRAVENOUS | Status: AC | PRN
  Administered 2024-06-03: 10 mL via INTRAVENOUS

## 2024-06-03 MED ORDER — LEVETIRACETAM 500 MG PO TABS: 500.0000 mg | ORAL_TABLET | Freq: Two times a day (BID) | ORAL | 3 refills | Status: DC

## 2024-06-03 NOTE — Addendum Note (Signed)
 Addended by: Rodriques Badie K on: 06/03/2024 02:58 PM   Modules accepted: Orders

## 2024-06-03 NOTE — Telephone Encounter (Signed)
 PC to patient's wife, Bishop Bullock - informed her Dr Mark Sil is sending a refill in for the patient's keppra  right now and that he can see the patient tomorrow at 3:45.  She verbalizes understanding.

## 2024-06-04 ENCOUNTER — Other Ambulatory Visit: Payer: Self-pay | Admitting: Internal Medicine

## 2024-06-04 ENCOUNTER — Inpatient Hospital Stay

## 2024-06-04 ENCOUNTER — Inpatient Hospital Stay: Attending: Radiation Oncology | Admitting: Internal Medicine

## 2024-06-04 ENCOUNTER — Ambulatory Visit: Admitting: Dermatology

## 2024-06-04 VITALS — BP 132/85 | HR 63 | Temp 97.3°F | Resp 20 | Wt 307.2 lb

## 2024-06-04 DIAGNOSIS — C711 Malignant neoplasm of frontal lobe: Secondary | ICD-10-CM | POA: Insufficient documentation

## 2024-06-04 DIAGNOSIS — G40909 Epilepsy, unspecified, not intractable, without status epilepticus: Secondary | ICD-10-CM | POA: Diagnosis not present

## 2024-06-04 DIAGNOSIS — Z5112 Encounter for antineoplastic immunotherapy: Secondary | ICD-10-CM | POA: Insufficient documentation

## 2024-06-04 LAB — CMP (CANCER CENTER ONLY)
ALT: 47 U/L — ABNORMAL HIGH (ref 0–44)
AST: 19 U/L (ref 15–41)
Albumin: 4 g/dL (ref 3.5–5.0)
Alkaline Phosphatase: 39 U/L (ref 38–126)
Anion gap: 6 (ref 5–15)
BUN: 19 mg/dL (ref 6–20)
CO2: 27 mmol/L (ref 22–32)
Calcium: 9.3 mg/dL (ref 8.9–10.3)
Chloride: 106 mmol/L (ref 98–111)
Creatinine: 1.14 mg/dL (ref 0.61–1.24)
GFR, Estimated: >60 mL/min (ref >60)
Glucose, Bld: 79 mg/dL (ref 70–99)
Potassium: 4 mmol/L (ref 3.5–5.1)
Sodium: 139 mmol/L (ref 135–145)
Total Bilirubin: 0.6 mg/dL (ref 0.0–1.2)
Total Protein: 6.7 g/dL (ref 6.5–8.1)

## 2024-06-04 LAB — CBC WITH DIFFERENTIAL (CANCER CENTER ONLY)
Abs Immature Granulocytes: 0.08 10*3/uL — ABNORMAL HIGH (ref 0.00–0.07)
Basophils Absolute: 0 10*3/uL (ref 0.0–0.1)
Basophils Relative: 0 %
Eosinophils Absolute: 0 10*3/uL (ref 0.0–0.5)
Eosinophils Relative: 0 %
HCT: 51.8 % (ref 39.0–52.0)
Hemoglobin: 17.2 g/dL — ABNORMAL HIGH (ref 13.0–17.0)
Immature Granulocytes: 1 %
Lymphocytes Relative: 15 %
Lymphs Abs: 0.9 10*3/uL (ref 0.7–4.0)
MCH: 28.1 pg (ref 26.0–34.0)
MCHC: 33.2 g/dL (ref 30.0–36.0)
MCV: 84.5 fL (ref 80.0–100.0)
Monocytes Absolute: 0.5 10*3/uL (ref 0.1–1.0)
Monocytes Relative: 9 %
Neutro Abs: 4.5 10*3/uL (ref 1.7–7.7)
Neutrophils Relative %: 75 %
Platelet Count: 127 10*3/uL — ABNORMAL LOW (ref 150–400)
RBC: 6.13 MIL/uL — ABNORMAL HIGH (ref 4.22–5.81)
RDW: 18.8 % — ABNORMAL HIGH (ref 11.5–15.5)
WBC Count: 6.1 10*3/uL (ref 4.0–10.5)
nRBC: 0 % (ref 0.0–0.2)

## 2024-06-04 MED ORDER — DEXAMETHASONE 4 MG PO TABS: 4.0000 mg | ORAL_TABLET | Freq: Two times a day (BID) | ORAL | 1 refills | Status: DC

## 2024-06-04 MED ORDER — TEMOZOLOMIDE 100 MG PO CAPS: 200.0000 mg | ORAL_CAPSULE | Freq: Every day | ORAL | 0 refills | Status: DC

## 2024-06-04 MED ORDER — FUROSEMIDE 20 MG PO TABS: 20.0000 mg | ORAL_TABLET | Freq: Every day | ORAL | 0 refills | Status: DC

## 2024-06-04 MED ORDER — TEMOZOLOMIDE 180 MG PO CAPS: 180.0000 mg | ORAL_CAPSULE | Freq: Every day | ORAL | 0 refills | Status: DC

## 2024-06-04 NOTE — Progress Notes (Signed)
DISCONTINUE ON PATHWAY REGIMEN - Neuro ? ? ?  One cycle, concurrent with RT: ?    Temozolomide  ? ?**Always confirm dose/schedule in your pharmacy ordering system** ? ?REASON: Continuation Of Treatment ?PRIOR TREATMENT: [id number]: Radiation Therapy with Concurrent Temozolomide 75 mg/m2 Daily x 6 Weeks, Followed by Adjuvant Temozolomide ?TREATMENT RESPONSE: Progressive Disease (PD) ? ?START ON PATHWAY REGIMEN - Neuro ? ? ?  A cycle is every 14 days: ?    Bevacizumab-xxxx  ? ?**Always confirm dose/schedule in your pharmacy ordering system** ? ?Patient Characteristics: ?Glioma, Glioblastoma, IDH-wildtype, Recurrent or Progressive, Nonsurgical Candidate, Systemic Therapy Candidate, BRAF V600E Mutation Negative/Unknown and NTRK Fusion Negative/Unknown ?Disease Classification: Glioma ?Disease Classification: Glioblastoma, IDH-wildtype ?Disease Status: Recurrent or Progressive ?Treatment Classification: Nonsurgical Candidate ?Treatment (Nonsurgical/Adjuvant): Systemic Therapy Candidate ?NTRK Gene Fusion Status: Negative ?BRAF V600E Mutation Status: Negative ?Intent of Therapy: ?Non-Curative / Palliative Intent, Discussed with Patient ?

## 2024-06-04 NOTE — Progress Notes (Signed)
 [facility] at [facility] [address]  [place], [place] [address] [phone]   Interval Evaluation  Date of Service: 06/04/24 Patient Name: Nicholas Stevens Patient MRN: [id number] Patient DOB: May 27, 1971 Provider: Mamie Searles, MD  Identifying Statement:  [name] is a 53 y.o. male with right frontal glioblastoma    Oncologic History: Oncology History  Frontal glioblastoma (HCC)  12/18/2023 Surgery   Craniotomy, resection of right frontal mass with Dr. Andy Bannister; path is glioblastoma, IDH pending   01/22/2024 -  Chemotherapy   Patient is on Treatment Plan : BRAIN GLIOBLASTOMA Radiation Therapy With Concurrent Temozolomide  75 mg/m2 Daily Followed By Sequential Maintenance Temozolomide  x 6-12 cycles         Interval History: [name] presents today following cycle #2 of 5-day Temodar , recent MRI brain.  He is declining in recent days, with worsening left leg greater than arm weakness.  He is needing to hold on to the wall or furniture in the home to walk.  Left arm and hand weakness are milder.  Has had several episodes of twitching of the left side of his face, Keppra  was recently increased to 1000mg  twice per day.  Also describes swelling in his lower legs, feet and face since increasing the decadron  to 3mg  daily.  Prior- presents for follow up after recent clinical changes.  He describes relatively rapid onset of left sided weakness starting ~10 days ago.  He was unable to walk and use the left arm meaningfully.  No seizure activity appreciated.  Decadron  was started 4mg  twice per day after 3-4 days of the weakness.  Once the steroid was started, he returned back to his prior baseline.  Continues on the Keppra .  Denies severe headaches.   H+P (01/08/24) Patient presented with several days of left sided clumsiness, impaired coordination.  Was found to have large right frontal mass, c/w primary brain tumor.  He underwent  craniotomy, resection with Dr. Andy Bannister on 12/18/23; path demonstrated glioblastoma.  Following surgery, he had persistent right sided weakness, he is currently in outpatient PT following discharge from rehab.  No seizures, headaches.     Medications: Current Outpatient Medications on File Prior to Visit  Medication Sig Dispense Refill   apixaban  (ELIQUIS ) 5 MG TABS tablet Take 1 tablet (5 mg total) by mouth 2 (two) times daily. 60 tablet 3   Baclofen  5 MG TABS Take 1 tablet (5 mg total) by mouth every 6 (six) hours as needed (hiccups). 90 tablet 1   camphor-menthol  (SARNA) lotion Apply topically as needed for itching. 222 mL 0   Cyanocobalamin  (B-12 PO) Take 1 tablet by mouth daily.     dexamethasone  (DECADRON ) 1 MG tablet Take 1 tablet (1 mg total) by mouth 2 (two) times daily with a meal. 60 tablet 1   latanoprost  (XALATAN ) 0.005 % ophthalmic solution Place 1 drop into both eyes at bedtime.     levETIRAcetam  (KEPPRA ) 500 MG tablet Take 1 tablet (500 mg total) by mouth 2 (two) times daily. 60 tablet 3   magic mouthwash (nystatin , lidocaine , diphenhydrAMINE , alum & mag hydroxide) suspension Swish and swallow 5 mLs by mouth 4 (four) times daily as needed for mouth pain. 140 mL 1   Multiple Vitamin (MULTIVITAMIN WITH MINERALS) TABS tablet Take 1 tablet by mouth daily.     Omega-3 Fatty Acids (FISH OIL) 1000 MG CAPS Take 1,000 mg by mouth daily.     ondansetron  (ZOFRAN ) 8 MG tablet Take 1  tablet (8 mg total) by mouth every 8 (eight) hours as needed for nausea or vomiting. May take 30-60 minutes prior to Temodar  administration if nausea/vomiting occurs as needed. 30 tablet 1   pantoprazole  (PROTONIX ) 40 MG tablet Take 1 tablet (40 mg total) by mouth 2 (two) times daily. 180 tablet 3   pimecrolimus  (ELIDEL ) 1 % cream Apply 1 Application topically 2 (two) times daily as needed (Dermatitis).     Semaglutide -Weight Management (WEGOVY ) 0.25 MG/0.5ML SOAJ Inject 0.25 mg into the skin once a week. 2 mL 2    senna-docusate (SENOKOT-S) 8.6-50 MG tablet Take 2 tablets by mouth daily at 6 (six) AM. 60 tablet 0   temozolomide  (TEMODAR ) 250 MG capsule Take 2 capsules (500 mg total) by mouth daily. (Take with one 180 mg capsule for total daily dose of 380 mg). Take for 5 days on, 23 days off. Repeat every 28 days. May take on an empty stomach to decrease nausea & vomiting. (Patient taking differently: Take 200 mg/m2/day by mouth daily. Take for 5 days on, 23 days off. Repeat every 28 days. May take on an empty stomach to decrease nausea & vomiting.) 10 capsule 0   triamcinolone  cream (KENALOG ) 0.1 % Apply 1 Application topically daily. Use on affected areas for 2 weeks, then stop for 2 weeks. Repeat until clear if needed. 453 g 0   triamterene -hydrochlorothiazide  (MAXZIDE -25) 37.5-25 MG tablet Take 1 tablet by mouth daily. 90 tablet 3   zolpidem  (AMBIEN ) 10 MG tablet TAKE 1 TABLET BY MOUTH EVERYDAY AT BEDTIME 90 tablet 1   fluconazole  (DIFLUCAN ) 100 MG tablet Take 2 tabs on day#1, then 1 tab daily on Days #2-10 11 tablet 1   olmesartan  (BENICAR ) 20 MG tablet Take 1 tablet (20 mg total) by mouth daily. 90 tablet 3   No current facility-administered medications on file prior to visit.    Allergies: No Known Allergies Past Medical History:  Past Medical History:  Diagnosis Date   DDD (degenerative disc disease), lumbar    HNP (herniated nucleus pulposus)    Hypertension    Liver hemangioma    Morbid obesity (HCC)    OSA (obstructive sleep apnea)    no cpap used since weight loss   Overweight(278.02)    Plantar fasciitis of right foot    Sleep apnea    Tinea barbae    Past Surgical History:  Past Surgical History:  Procedure Laterality Date   BREATH TEK H PYLORI N/A 08/20/2013   Procedure: BREATH TEK H PYLORI;  Surgeon: Azucena Bollard, MD;  Location: [facility];  Service: General;  Laterality: N/A;   COLONOSCOPY WITH PROPOFOL  N/A 05/05/2022   Procedure: COLONOSCOPY WITH PROPOFOL ;  Surgeon:  Nannette Babe, MD;  Location: [facility];  Service: Gastroenterology;  Laterality: N/A;   CRANIOTOMY Right 12/18/2023   Procedure: Right Frontal Stereotactic Craniotomy for Resection of Tumor;  Surgeon: Van Gelinas, MD;  Location: [facility] OR;  Service: Neurosurgery;  Laterality: Right;   ESOPHAGOGASTRODUODENOSCOPY (EGD) WITH PROPOFOL  N/A 03/15/2023   Procedure: ESOPHAGOGASTRODUODENOSCOPY (EGD) WITH PROPOFOL ;  Surgeon: Kenney Peacemaker, MD;  Location: [facility];  Service: Gastroenterology;  Laterality: N/A;   LAPAROSCOPIC APPENDECTOMY  2007   Dr Alethea Andes   LAPAROSCOPIC GASTRIC SLEEVE RESECTION N/A 10/08/2013   Procedure: LAPAROSCOPIC SLEEVE GASTRECTOMY;  Surgeon: Azucena Bollard, MD;  Location: [facility];  Service: General;  Laterality: N/A;   LUMBAR LAMINECTOMY/DECOMPRESSION MICRODISCECTOMY N/A 09/16/2015   Procedure: MICRO LUMBAR DECOMPRESSION, MICRODISCECTOMY L5 - S1;  Surgeon:  Orvan Blanch, MD;  Location: [facility];  Service: Orthopedics;  Laterality: N/A;   POLYPECTOMY  05/05/2022   Procedure: POLYPECTOMY;  Surgeon: Nannette Babe, MD;  Location: [facility];  Service: Gastroenterology;;   ROTATOR CUFF REPAIR Right 2005   Dr Julio Ohm   Social History:  Social History   Socioeconomic History   Marital status: Married    Spouse name: Kalev Temme   Number of children: 3   Years of education: Not on file   Highest education level: 12th grade  Occupational History   Occupation: Doctor, general practice PD    Employer: UNEMPLOYED  Tobacco Use   Smoking status: Never    Passive exposure: Never   Smokeless tobacco: Never  Vaping Use   Vaping status: Never Used  Substance and Sexual Activity   Alcohol  use: No   Drug use: No   Sexual activity: Not on file  Other Topics Concern   Not on file  Social History Narrative   Married 18+ years   3 children - ages approx 58, 9 and 5...daughter age 80 w/ DM type 1   Social Drivers of Corporate investment banker Strain: Low Risk  (04/02/2023)   Overall Financial  Resource Strain (CARDIA)    Difficulty of Paying Living Expenses: Not hard at all  Food Insecurity: No Food Insecurity (12/18/2023)   Hunger Vital Sign    Worried About Running Out of Food in the Last Year: Never true    Ran Out of Food in the Last Year: Never true  Transportation Needs: No Transportation Needs (12/18/2023)   PRAPARE - Administrator, Civil Service (Medical): No    Lack of Transportation (Non-Medical): No  Physical Activity: Insufficiently Active (04/02/2023)   Exercise Vital Sign    Days of Exercise per Week: 4 days    Minutes of Exercise per Session: 30 min  Stress: No Stress Concern Present (04/02/2023)   [employer] of Occupational Health - Occupational Stress Questionnaire    Feeling of Stress : Not at all  Social Connections: Socially Integrated (04/02/2023)   Social Connection and Isolation Panel [NHANES]    Frequency of Communication with Friends and Family: More than three times a week    Frequency of Social Gatherings with Friends and Family: Once a week    Attends Religious Services: More than 4 times per year    Active Member of [employer] or Organizations: Yes    Attends Engineer, structural: More than 4 times per year    Marital Status: Married  Catering manager Violence: Not At Risk (12/18/2023)   Humiliation, Afraid, Rape, and Kick questionnaire    Fear of Current or Ex-Partner: No    Emotionally Abused: No    Physically Abused: No    Sexually Abused: No   Family History:  Family History  Problem Relation Age of Onset   Other Mother        hx of DJD   Liver disease Father        ETOH, Hep C   Hypertension Father    Colon cancer Neg Hx    Colon polyps Neg Hx    Esophageal cancer Neg Hx    Rectal cancer Neg Hx    Stomach cancer Neg Hx     Review of Systems: Constitutional: Doesn't report fevers, chills or abnormal weight loss Eyes: Doesn't report blurriness of vision Ears, nose, mouth, throat, and face: Doesn't report  sore throat Respiratory: Doesn't report cough, dyspnea or wheezes  Cardiovascular: Doesn't report palpitation, chest discomfort  Gastrointestinal:  Doesn't report nausea, constipation, diarrhea GU: Doesn't report incontinence Skin: Doesn't report skin rashes Neurological: Per HPI Musculoskeletal: Doesn't report joint pain Behavioral/Psych: Doesn't report anxiety  Physical Exam: Vitals:   06/04/24 1600  BP: 132/85  Pulse: 63  Resp: 20  Temp: (!) 97.3 F (36.3 C)  SpO2: 97%    KPS: 70. General: Alert, cooperative, pleasant, in no acute distress Head: Normal EENT: No conjunctival injection or scleral icterus.  Lungs: Resp effort normal Cardiac: Regular rate Abdomen: Non-distended abdomen Skin: No rashes cyanosis or petechiae. Extremities: No clubbing or edema  Neurologic Exam: Mental Status: Awake, alert, attentive to examiner. Oriented to self and environment. Language is fluent with intact comprehension.  Cranial Nerves: Visual acuity is grossly normal. Visual fields are full. Extra-ocular movements intact. No ptosis. Face is symmetric Motor: Tone and bulk are normal. Power is 4+/5 in left arm and 4/5 leg. Reflexes are symmetric, no pathologic reflexes present.  Sensory: Intact to light touch Gait: Hemiparetic   Labs: I have reviewed the data as listed    Component Value Date/Time   NA 139 06/04/2024 1452   K 4.0 06/04/2024 1452   CL 106 06/04/2024 1452   CO2 27 06/04/2024 1452   GLUCOSE 79 06/04/2024 1452   BUN 19 06/04/2024 1452   CREATININE 1.14 06/04/2024 1452   CALCIUM 9.3 06/04/2024 1452   PROT 6.7 06/04/2024 1452   ALBUMIN 4.0 06/04/2024 1452   AST 19 06/04/2024 1452   ALT 47 (H) 06/04/2024 1452   ALKPHOS 39 06/04/2024 1452   BILITOT 0.6 06/04/2024 1452   GFRNONAA >60 06/04/2024 1452   GFRAA >60 09/15/2015 1520   Lab Results  Component Value Date   WBC 6.1 06/04/2024   NEUTROABS 4.5 06/04/2024   HGB 17.2 (H) 06/04/2024   HCT 51.8 06/04/2024   MCV  84.5 06/04/2024   PLT 127 (L) 06/04/2024    Imaging:  [facility] Clinician Interpretation: I have personally reviewed the CNS images as listed.  My interpretation, in the context of the patient's clinical presentation, is treatment effect vs true progression  MR BRAIN W WO CONTRAST Result Date: 06/03/2024 CLINICAL DATA:  Brain/CNS neoplasm, assess treatment response. Right frontal glioblastoma. EXAM: MRI HEAD WITHOUT AND WITH CONTRAST TECHNIQUE: Multiplanar, multiecho pulse sequences of the brain and surrounding structures were obtained without and with intravenous contrast. CONTRAST:  10mL GADAVIST  GADOBUTROL  1 MMOL/ML IV SOLN COMPARISON:  Head MRI 03/15/2024 FINDINGS: Brain: Sequelae of right frontal craniotomy and tumor resection are again identified. The enhancing, necrotic mass containing chronic blood products at the right frontal resection site has further enlarged, now measuring 2.7 x 4.8 x 4.8 cm. Peripheral enhancement of the mass has increased in thickness. There is increased, extensive nonenhancing T2 hyperintensity in the surrounding right frontal white matter extending into the anterior right parietal lobe, corpus callosum, and external capsule with increased mass effect including partial effacement of the right lateral ventricle and 3 mm of leftward midline shift. More inferiorly, infiltrative nonenhancing T2 hyperintensity compatible with tumor involving cortex in the medial right frontal lobe and insula has not significantly changed. No acute infarct, hydrocephalus, or significant extra-axial fluid collection is identified. Vascular: Major intracranial vascular flow voids are preserved. Skull and upper cervical spine: Right frontal craniotomy. No suspicious marrow lesion. Sinuses/Orbits: Unremarkable orbits. Paranasal sinuses and mastoid air cells are clear. Other: None. IMPRESSION: Further enlargement of the necrotic right frontal mass with increased edema which may reflect worsening post  treatment changes or tumor. Increased mass effect with 3 mm of leftward midline shift. Electronically Signed   By: Aundra Lee M.D.   On: 06/03/2024 14:26    Assessment/Plan Frontal glioblastoma (HCC)  Seizure disorder (HCC)  Kenneth W Shatzer is clinically progressive today, with worsening of left sided motor function and breakthrough seizures; now having completed cycle #2 of 5-day Temodar .  Labs demonstrate relative thrombocytopenia.  MRI brain demonstrates progression of disease, consistent with pseudo-progression or organic tumor growth, favoring the former.    Recommended the following: -Increase decadron  to 4mg  BID today, prescribed 20mg  lasix  daily for excess edema -Obtain FDG-PET brain for metabolic assessment, tumor recurrence vs necrosis -Initiate avastin 10mg /kg for inflammation and (possible) tumor progression refractory to corticosteroids, Temodar  -For now con't with cycle #3 Temodar  but at reduced dose 150mg /m2 -Will repeat MRI brain after cycle #3 -Con't Keppra  1000mg  BID for now  We recommended initiating treatment with cycle #3 Temozolomide , dose reduced to 150 mg/m2, on for five days and off for twenty three days in twenty eight day cycles. The patient will have a complete blood count performed on days 21 and 28 of each cycle, and a comprehensive metabolic panel performed on day 28 of each cycle. Labs may need to be performed more often. Zofran  will prescribed for home use for nausea/vomiting.   Informed consent was obtained verbally at bedside to proceed with oral chemotherapy.  Chemotherapy should be held for the following:  ANC less than 1,000  Platelets less than 100,000  LFT or creatinine greater than 2x ULN  If clinical concerns/contraindications develop  We ask that [employer] return to clinic for initiation of avastin treatment plan, or sooner if needed.  All questions were answered. The patient knows to call the clinic with any problems,  questions or concerns. No barriers to learning were detected.  The total time spent in the encounter was 40 minutes and more than 50% was on counseling and review of test results   Mamie Searles, MD Medical Director of Neuro-Oncology [facility] 06/04/24 4:27 PM

## 2024-06-05 ENCOUNTER — Other Ambulatory Visit: Payer: Self-pay

## 2024-06-06 ENCOUNTER — Other Ambulatory Visit: Admitting: Licensed Clinical Social Worker

## 2024-06-06 ENCOUNTER — Other Ambulatory Visit

## 2024-06-06 ENCOUNTER — Ambulatory Visit: Admitting: Internal Medicine

## 2024-06-07 ENCOUNTER — Other Ambulatory Visit: Payer: Self-pay

## 2024-06-07 MED ORDER — LATANOPROST 0.005 % OP SOLN: 1.0000 [drp] | Freq: Every day | OPHTHALMIC | 2 refills | Status: DC

## 2024-06-10 ENCOUNTER — Other Ambulatory Visit: Payer: Self-pay | Admitting: Internal Medicine

## 2024-06-10 ENCOUNTER — Other Ambulatory Visit: Payer: Self-pay | Admitting: *Deleted

## 2024-06-10 ENCOUNTER — Encounter: Payer: Self-pay | Admitting: Internal Medicine

## 2024-06-10 DIAGNOSIS — C711 Malignant neoplasm of frontal lobe: Secondary | ICD-10-CM

## 2024-06-10 MED ORDER — LATANOPROST 0.005 % OP SOLN: 1.0000 [drp] | Freq: Every day | OPHTHALMIC | 2 refills | Status: DC

## 2024-06-11 ENCOUNTER — Inpatient Hospital Stay

## 2024-06-11 ENCOUNTER — Inpatient Hospital Stay (HOSPITAL_BASED_OUTPATIENT_CLINIC_OR_DEPARTMENT_OTHER): Admitting: Internal Medicine

## 2024-06-11 ENCOUNTER — Encounter: Payer: Self-pay | Admitting: Internal Medicine

## 2024-06-11 VITALS — BP 123/79 | HR 59 | Temp 97.2°F | Resp 20 | Wt 304.0 lb

## 2024-06-11 DIAGNOSIS — G40909 Epilepsy, unspecified, not intractable, without status epilepticus: Secondary | ICD-10-CM

## 2024-06-11 DIAGNOSIS — C711 Malignant neoplasm of frontal lobe: Secondary | ICD-10-CM

## 2024-06-11 DIAGNOSIS — Z5112 Encounter for antineoplastic immunotherapy: Secondary | ICD-10-CM | POA: Diagnosis not present

## 2024-06-11 LAB — CBC WITH DIFFERENTIAL (CANCER CENTER ONLY)
Abs Immature Granulocytes: 0.17 10*3/uL — ABNORMAL HIGH (ref 0.00–0.07)
Basophils Absolute: 0.1 10*3/uL (ref 0.0–0.1)
Basophils Relative: 1 %
Eosinophils Absolute: 0 10*3/uL (ref 0.0–0.5)
Eosinophils Relative: 0 %
HCT: 56.5 % — ABNORMAL HIGH (ref 39.0–52.0)
Hemoglobin: 19 g/dL — ABNORMAL HIGH (ref 13.0–17.0)
Immature Granulocytes: 2 %
Lymphocytes Relative: 10 %
Lymphs Abs: 0.8 10*3/uL (ref 0.7–4.0)
MCH: 28.4 pg (ref 26.0–34.0)
MCHC: 33.6 g/dL (ref 30.0–36.0)
MCV: 84.6 fL (ref 80.0–100.0)
Monocytes Absolute: 0.5 10*3/uL (ref 0.1–1.0)
Monocytes Relative: 6 %
Neutro Abs: 6.8 10*3/uL (ref 1.7–7.7)
Neutrophils Relative %: 81 %
Platelet Count: 207 10*3/uL (ref 150–400)
RBC: 6.68 MIL/uL — ABNORMAL HIGH (ref 4.22–5.81)
RDW: 18.8 % — ABNORMAL HIGH (ref 11.5–15.5)
WBC Count: 8.3 10*3/uL (ref 4.0–10.5)
nRBC: 0 % (ref 0.0–0.2)

## 2024-06-11 LAB — CMP (CANCER CENTER ONLY)
ALT: 81 U/L — ABNORMAL HIGH (ref 0–44)
AST: 26 U/L (ref 15–41)
Albumin: 4 g/dL (ref 3.5–5.0)
Alkaline Phosphatase: 40 U/L (ref 38–126)
Anion gap: 9 (ref 5–15)
BUN: 28 mg/dL — ABNORMAL HIGH (ref 6–20)
CO2: 25 mmol/L (ref 22–32)
Calcium: 9.5 mg/dL (ref 8.9–10.3)
Chloride: 104 mmol/L (ref 98–111)
Creatinine: 1.08 mg/dL (ref 0.61–1.24)
GFR, Estimated: >60 mL/min (ref >60)
Glucose, Bld: 146 mg/dL — ABNORMAL HIGH (ref 70–99)
Potassium: 4.2 mmol/L (ref 3.5–5.1)
Sodium: 138 mmol/L (ref 135–145)
Total Bilirubin: 0.7 mg/dL (ref 0.0–1.2)
Total Protein: 6.7 g/dL (ref 6.5–8.1)

## 2024-06-11 MED ORDER — SODIUM CHLORIDE 0.9 % IV SOLN: INTRAVENOUS | Status: DC

## 2024-06-11 MED ORDER — SODIUM CHLORIDE 0.9 % IV SOLN
10.0000 mg/kg | Freq: Once | INTRAVENOUS | Status: AC
  Administered 2024-06-11: 1400 mg via INTRAVENOUS
  Filled 2024-06-11: qty 50

## 2024-06-11 NOTE — Patient Instructions (Signed)
 [facility] - A DEPT OF [facility]  Discharge Instructions: Thank you for choosing [facility] to provide your oncology and hematology care.   If you have a lab appointment with the Cancer Center, please go directly to the Cancer Center and check in at the registration area.   Wear comfortable clothing and clothing appropriate for easy access to any Portacath or PICC line.   We strive to give you quality time with your provider. You may need to reschedule your appointment if you arrive late (15 or more minutes).  Arriving late affects you and other patients whose appointments are after yours.  Also, if you miss three or more appointments without notifying the office, you may be dismissed from the clinic at the provider's discretion.      For prescription refill requests, have your pharmacy contact our office and allow 72 hours for refills to be completed.    Today you received the following chemotherapy and/or immunotherapy agents: [id number]-awwb      To help prevent nausea and vomiting after your treatment, we encourage you to take your nausea medication as directed.  BELOW ARE SYMPTOMS THAT SHOULD BE REPORTED IMMEDIATELY: *FEVER GREATER THAN 100.4 F (38 C) OR HIGHER *CHILLS OR SWEATING *NAUSEA AND VOMITING THAT IS NOT CONTROLLED WITH YOUR NAUSEA MEDICATION *UNUSUAL SHORTNESS OF BREATH *UNUSUAL BRUISING OR BLEEDING *URINARY PROBLEMS (pain or burning when urinating, or frequent urination) *BOWEL PROBLEMS (unusual diarrhea, constipation, pain near the anus) TENDERNESS IN MOUTH AND THROAT WITH OR WITHOUT PRESENCE OF ULCERS (sore throat, sores in mouth, or a toothache) UNUSUAL RASH, SWELLING OR PAIN  UNUSUAL VAGINAL DISCHARGE OR ITCHING   Items with * indicate a potential emergency and should be followed up as soon as possible or go to the Emergency Department if any problems should occur.  Please show the CHEMOTHERAPY ALERT CARD or  IMMUNOTHERAPY ALERT CARD at check-in to the Emergency Department and triage nurse.  Should you have questions after your visit or need to cancel or reschedule your appointment, please contact [facility] - A DEPT OF [facility]  Dept: [phone]  and follow the prompts.  Office hours are 8:00 a.m. to 4:30 p.m. Monday - Friday. Please note that voicemails left after 4:00 p.m. may not be returned until the following business day.  We are closed weekends and major holidays. You have access to a nurse at all times for urgent questions. Please call the main number to the clinic Dept: [phone] and follow the prompts.   For any non-urgent questions, you may also contact your provider using MyChart. We now offer e-Visits for anyone 12 and older to request care online for non-urgent symptoms. For details visit [email or web address].   Also download the MyChart app! Go to the app store, search "MyChart", open the app, select [place], and log in with your MyChart username and password.

## 2024-06-11 NOTE — Progress Notes (Signed)
 Ok to tx without urine protein today per Dr Mark Sil

## 2024-06-11 NOTE — Progress Notes (Signed)
 Per Dr Mark Sil, ok to proceed with ALT 81 and no urine protein today.

## 2024-06-11 NOTE — Progress Notes (Signed)
 [facility] [address]  [place], [place] [address] [phone]   Interval Evaluation  Date of Service: 06/11/24 Patient Name: Nicholas Stevens Patient MRN: [id number] Patient DOB: Jun 13, 1971 Provider: Mamie Searles, MD  Identifying Statement:  Nicholas Stevens is a 53 y.o. male with right frontal glioblastoma    Oncologic History: Oncology History  Frontal glioblastoma (HCC)  12/18/2023 Surgery   Craniotomy, resection of right frontal mass with Dr. Andy Bannister; path is glioblastoma, IDH pending   01/22/2024 - 01/22/2024 Chemotherapy   Patient is on Treatment Plan : BRAIN GLIOBLASTOMA Radiation Therapy With Concurrent Temozolomide  75 mg/m2 Daily Followed By Sequential Maintenance Temozolomide  x 6-12 cycles     06/10/2024 -  Chemotherapy   Patient is on Treatment Plan : BRAIN Temozolomide  D1-5 + Bevacizumab (10) D1,15 q28d         Interval History: Nicholas Stevens presents today for first avastin infusion, following initiation of cycle #3 of 5-day Temodar  started on 06/09/24.  He is improved with regards to his left sided weakness.  No longer needing to hold on to the wall or furniture in the home to walk.  No further seizures since Keppra  was increased to 1000mg  twice per day.  Swelling in lower legs is actually improved despite increasing decadron  to 4mg  twice per day, he has dosed lasix  only once per his wife.  Facial swelling has not improved.  Prior- presents for follow up after recent clinical changes.  He describes relatively rapid onset of left sided weakness starting ~10 days ago.  He was unable to walk and use the left arm meaningfully.  No seizure activity appreciated.  Decadron  was started 4mg  twice per day after 3-4 days of the weakness.  Once the steroid was started, he returned back to his prior baseline.  Continues on the Keppra .  Denies severe headaches.   H+P (01/08/24) Patient presented with several days of left  sided clumsiness, impaired coordination.  Was found to have large right frontal mass, c/w primary brain tumor.  He underwent craniotomy, resection with Dr. Andy Bannister on 12/18/23; path demonstrated glioblastoma.  Following surgery, he had persistent right sided weakness, he is currently in outpatient PT following discharge from rehab.  No seizures, headaches.     Medications: Current Outpatient Medications on File Prior to Visit  Medication Sig Dispense Refill   apixaban  (ELIQUIS ) 5 MG TABS tablet Take 1 tablet (5 mg total) by mouth 2 (two) times daily. 60 tablet 3   Baclofen  5 MG TABS Take 1 tablet (5 mg total) by mouth every 6 (six) hours as needed (hiccups). 90 tablet 1   camphor-menthol  (SARNA) lotion Apply topically as needed for itching. 222 mL 0   Cyanocobalamin  (B-12 PO) Take 1 tablet by mouth daily.     dexamethasone  (DECADRON ) 4 MG tablet Take 1 tablet (4 mg total) by mouth 2 (two) times daily with a meal. 60 tablet 1   fluconazole  (DIFLUCAN ) 100 MG tablet Take 2 tabs on day#1, then 1 tab daily on Days #2-10 11 tablet 1   furosemide  (LASIX ) 20 MG tablet TAKE 1 TABLET(20 MG) BY MOUTH DAILY 90 tablet 0   latanoprost  (XALATAN ) 0.005 % ophthalmic solution Place 1 drop into both eyes at bedtime. 2.5 mL 2   levETIRAcetam  (KEPPRA ) 500 MG tablet Take 1 tablet (500 mg total) by mouth 2 (two) times daily. (Patient taking differently: Take 1,000 mg by mouth 2 (two) times daily.) 60 tablet 3  magic mouthwash (nystatin , lidocaine , diphenhydrAMINE , alum & mag hydroxide) suspension Swish and swallow 5 mLs by mouth 4 (four) times daily as needed for mouth pain. 140 mL 1   Multiple Vitamin (MULTIVITAMIN WITH MINERALS) TABS tablet Take 1 tablet by mouth daily.     olmesartan  (BENICAR ) 20 MG tablet Take 1 tablet (20 mg total) by mouth daily. 90 tablet 3   Omega-3 Fatty Acids (FISH OIL) 1000 MG CAPS Take 1,000 mg by mouth daily.     pantoprazole  (PROTONIX ) 40 MG tablet Take 1 tablet (40 mg total) by mouth 2  (two) times daily. 180 tablet 3   pimecrolimus  (ELIDEL ) 1 % cream Apply 1 Application topically 2 (two) times daily as needed (Dermatitis).     Semaglutide -Weight Management (WEGOVY ) 0.25 MG/0.5ML SOAJ Inject 0.25 mg into the skin once a week. 2 mL 2   senna-docusate (SENOKOT-S) 8.6-50 MG tablet Take 2 tablets by mouth daily at 6 (six) AM. 60 tablet 0   temozolomide  (TEMODAR ) 100 MG capsule Take 2 capsules (200 mg total) by mouth daily. (Take with one 180 mg capsule for total daily dose of 380 mg). Take for 5 days on, 23 days off. Repeat every 28 days. May take on an empty stomach to decrease nausea & vomiting. 10 capsule 0   temozolomide  (TEMODAR ) 180 MG capsule Take 1 capsule (180 mg total) by mouth daily. (Take with two 100 mg capsules for total daily dose of 380 mg). Take for 5 days on, 23 days off. Repeat every 28 days. May take on an empty stomach to decrease nausea & vomiting. 5 capsule 0   triamcinolone  cream (KENALOG ) 0.1 % Apply 1 Application topically daily. Use on affected areas for 2 weeks, then stop for 2 weeks. Repeat until clear if needed. 453 g 0   triamterene -hydrochlorothiazide  (MAXZIDE -25) 37.5-25 MG tablet Take 1 tablet by mouth daily. 90 tablet 3   zolpidem  (AMBIEN ) 10 MG tablet TAKE 1 TABLET BY MOUTH EVERYDAY AT BEDTIME 90 tablet 1   No current facility-administered medications on file prior to visit.    Allergies: No Known Allergies Past Medical History:  Past Medical History:  Diagnosis Date   DDD (degenerative disc disease), lumbar    HNP (herniated nucleus pulposus)    Hypertension    Liver hemangioma    Morbid obesity (HCC)    OSA (obstructive sleep apnea)    no cpap used since weight loss   Overweight(278.02)    Plantar fasciitis of right foot    Sleep apnea    Tinea barbae    Past Surgical History:  Past Surgical History:  Procedure Laterality Date   BREATH TEK H PYLORI N/A 08/20/2013   Procedure: BREATH TEK H PYLORI;  Surgeon: Azucena Bollard, MD;   Location: [facility];  Service: General;  Laterality: N/A;   COLONOSCOPY WITH PROPOFOL  N/A 05/05/2022   Procedure: COLONOSCOPY WITH PROPOFOL ;  Surgeon: Nannette Babe, MD;  Location: [facility];  Service: Gastroenterology;  Laterality: N/A;   CRANIOTOMY Right 12/18/2023   Procedure: Right Frontal Stereotactic Craniotomy for Resection of Tumor;  Surgeon: Van Gelinas, MD;  Location: [facility] OR;  Service: Neurosurgery;  Laterality: Right;   ESOPHAGOGASTRODUODENOSCOPY (EGD) WITH PROPOFOL  N/A 03/15/2023   Procedure: ESOPHAGOGASTRODUODENOSCOPY (EGD) WITH PROPOFOL ;  Surgeon: Kenney Peacemaker, MD;  Location: [facility];  Service: Gastroenterology;  Laterality: N/A;   LAPAROSCOPIC APPENDECTOMY  2007   Dr Alethea Andes   LAPAROSCOPIC GASTRIC SLEEVE RESECTION N/A 10/08/2013   Procedure: LAPAROSCOPIC SLEEVE GASTRECTOMY;  Surgeon:  Azucena Bollard, MD;  Location: [facility];  Service: General;  Laterality: N/A;   LUMBAR LAMINECTOMY/DECOMPRESSION MICRODISCECTOMY N/A 09/16/2015   Procedure: MICRO LUMBAR DECOMPRESSION, MICRODISCECTOMY L5 - S1;  Surgeon: Orvan Blanch, MD;  Location: [facility];  Service: Orthopedics;  Laterality: N/A;   POLYPECTOMY  05/05/2022   Procedure: POLYPECTOMY;  Surgeon: Nannette Babe, MD;  Location: [facility];  Service: Gastroenterology;;   ROTATOR CUFF REPAIR Right 2005   Dr Julio Ohm   Social History:  Social History   Socioeconomic History   Marital status: Married    Spouse name: Daisuke Bailey   Number of children: 3   Years of education: Not on file   Highest education level: 12th grade  Occupational History   Occupation: Doctor, general practice PD    Employer: UNEMPLOYED  Tobacco Use   Smoking status: Never    Passive exposure: Never   Smokeless tobacco: Never  Vaping Use   Vaping status: Never Used  Substance and Sexual Activity   Alcohol  use: No   Drug use: No   Sexual activity: Not on file  Other Topics Concern   Not on file  Social History Narrative   Married 18+ years   3 children - ages  approx 72, 9 and 5...daughter age 50 w/ DM type 1   Social Drivers of Corporate investment banker Strain: Low Risk  (04/02/2023)   Overall Financial Resource Strain (CARDIA)    Difficulty of Paying Living Expenses: Not hard at all  Food Insecurity: No Food Insecurity (12/18/2023)   Hunger Vital Sign    Worried About Running Out of Food in the Last Year: Never true    Ran Out of Food in the Last Year: Never true  Transportation Needs: No Transportation Needs (12/18/2023)   PRAPARE - Administrator, Civil Service (Medical): No    Lack of Transportation (Non-Medical): No  Physical Activity: Insufficiently Active (04/02/2023)   Exercise Vital Sign    Days of Exercise per Week: 4 days    Minutes of Exercise per Session: 30 min  Stress: No Stress Concern Present (04/02/2023)   [employer] of Occupational Health - Occupational Stress Questionnaire    Feeling of Stress : Not at all  Social Connections: Socially Integrated (04/02/2023)   Social Connection and Isolation Panel    Frequency of Communication with Friends and Family: More than three times a week    Frequency of Social Gatherings with Friends and Family: Once a week    Attends Religious Services: More than 4 times per year    Active Member of [employer] or Organizations: Yes    Attends Engineer, structural: More than 4 times per year    Marital Status: Married  Catering manager Violence: Not At Risk (12/18/2023)   Humiliation, Afraid, Rape, and Kick questionnaire    Fear of Current or Ex-Partner: No    Emotionally Abused: No    Physically Abused: No    Sexually Abused: No   Family History:  Family History  Problem Relation Age of Onset   Other Mother        hx of DJD   Liver disease Father        ETOH, Hep C   Hypertension Father    Colon cancer Neg Hx    Colon polyps Neg Hx    Esophageal cancer Neg Hx    Rectal cancer Neg Hx    Stomach cancer Neg Hx     Review of Systems: Constitutional:  Doesn't  report fevers, chills or abnormal weight loss Eyes: Doesn't report blurriness of vision Ears, nose, mouth, throat, and face: Doesn't report sore throat Respiratory: Doesn't report cough, dyspnea or wheezes Cardiovascular: Doesn't report palpitation, chest discomfort  Gastrointestinal:  Doesn't report nausea, constipation, diarrhea GU: Doesn't report incontinence Skin: Doesn't report skin rashes Neurological: Per HPI Musculoskeletal: Doesn't report joint pain Behavioral/Psych: Doesn't report anxiety  Physical Exam: Vitals:   06/11/24 1501  BP: 123/79  Pulse: (!) 59  Resp: 20  Temp: (!) 97.2 F (36.2 C)  SpO2: 96%    KPS: 70. General: Alert, cooperative, pleasant, in no acute distress Head: Normal EENT: No conjunctival injection or scleral icterus.  Lungs: Resp effort normal Cardiac: Regular rate Abdomen: Non-distended abdomen Skin: No rashes cyanosis or petechiae. Extremities: No clubbing or edema  Neurologic Exam: Mental Status: Awake, alert, attentive to examiner. Oriented to self and environment. Language is fluent with intact comprehension.  Cranial Nerves: Visual acuity is grossly normal. Visual fields are full. Extra-ocular movements intact. No ptosis. Face is symmetric Motor: Tone and bulk are normal. Power is 4+/5 in left arm and 4/5 leg. Reflexes are symmetric, no pathologic reflexes present.  Sensory: Intact to light touch Gait: Hemiparetic   Labs: I have reviewed the data as listed    Component Value Date/Time   NA 139 06/04/2024 1452   K 4.0 06/04/2024 1452   CL 106 06/04/2024 1452   CO2 27 06/04/2024 1452   GLUCOSE 79 06/04/2024 1452   BUN 19 06/04/2024 1452   CREATININE 1.14 06/04/2024 1452   CALCIUM 9.3 06/04/2024 1452   PROT 6.7 06/04/2024 1452   ALBUMIN 4.0 06/04/2024 1452   AST 19 06/04/2024 1452   ALT 47 (H) 06/04/2024 1452   ALKPHOS 39 06/04/2024 1452   BILITOT 0.6 06/04/2024 1452   GFRNONAA >60 06/04/2024 1452   GFRAA >60 09/15/2015 1520    Lab Results  Component Value Date   WBC 6.1 06/04/2024   NEUTROABS 4.5 06/04/2024   HGB 17.2 (H) 06/04/2024   HCT 51.8 06/04/2024   MCV 84.5 06/04/2024   PLT 127 (L) 06/04/2024     Assessment/Plan Frontal glioblastoma (HCC)  Seizure disorder (HCC)  Kohan W Gatta is clinically stable or improved today since increasing decadron  to 4mg  BID.  Cycle #3 Temodar  was dosed on 06/09/24.  Ok to proceed with avastin 10mg /kg initiation today.  Urine protein is pending.  He has FDG-PET upcoming next week for metabolic characterization of recent progression.  Will also plan to repeat MRI brain after this cycle.  Decadron  should decrease to 4mg  daily starting tomorrow, and 2mg  daily in 1 week if tolerated.  We recommended continuing treatment with cycle #3 Temozolomide , dose reduced to 150 mg/m2, on for five days and off for twenty three days in twenty eight day cycles. The patient will have a complete blood count performed on days 21 and 28 of each cycle, and a comprehensive metabolic panel performed on day 28 of each cycle. Labs may need to be performed more often. Zofran  will prescribed for home use for nausea/vomiting.   Informed consent was obtained verbally at bedside to proceed with oral chemotherapy.  Chemotherapy should be held for the following:  ANC less than 1,000  Platelets less than 100,000  LFT or creatinine greater than 2x ULN  If clinical concerns/contraindications develop  We ask that [employer] return to clinic in 2 weeks for avastin/TMZ cycle #3, day 15/28, or sooner as needed.  All questions  were answered. The patient knows to call the clinic with any problems, questions or concerns. No barriers to learning were detected.  The total time spent in the encounter was 30 minutes and more than 50% was on counseling and review of test results   Mamie Searles, MD Medical Director of Neuro-Oncology [facility] at [facility] 06/11/24  3:09 PM

## 2024-06-12 ENCOUNTER — Telehealth: Payer: Self-pay

## 2024-06-12 ENCOUNTER — Encounter: Payer: Self-pay | Admitting: Internal Medicine

## 2024-06-12 NOTE — Telephone Encounter (Signed)
Nicholas Stevens states that he is doing fine. He is eating, drinking, and urinating well.  He knows to call the office at [phone] if he has any questions or concerns.

## 2024-06-12 NOTE — Telephone Encounter (Signed)
-----   Message from Nurse Ladena Picking sent at 06/11/2024  4:46 PM EDT ----- Regarding: 1st Time bevacizumab- Dr Mark Sil 1st Time bevacizumab- Dr Mark Sil No issues during treatment.

## 2024-06-13 ENCOUNTER — Other Ambulatory Visit: Payer: Self-pay

## 2024-06-14 ENCOUNTER — Inpatient Hospital Stay: Admitting: Licensed Clinical Social Worker

## 2024-06-17 ENCOUNTER — Encounter (HOSPITAL_COMMUNITY)
Admission: RE | Admit: 2024-06-17 | Discharge: 2024-06-17 | Disposition: A | Discharge status: Home / Self Care | Source: Ambulatory Visit | Attending: Internal Medicine | Admitting: Internal Medicine

## 2024-06-17 DIAGNOSIS — C711 Malignant neoplasm of frontal lobe: Secondary | ICD-10-CM | POA: Insufficient documentation

## 2024-06-17 LAB — GLUCOSE, CAPILLARY: Glucose-Capillary: 98 mg/dL (ref 70–99)

## 2024-06-17 MED ORDER — FLUDEOXYGLUCOSE F - 18 (FDG) INJECTION
10.0000 | Freq: Once | INTRAVENOUS | Status: AC
  Administered 2024-06-17: 9.99 via INTRAVENOUS

## 2024-06-24 ENCOUNTER — Inpatient Hospital Stay (HOSPITAL_BASED_OUTPATIENT_CLINIC_OR_DEPARTMENT_OTHER): Admitting: Internal Medicine

## 2024-06-24 ENCOUNTER — Inpatient Hospital Stay

## 2024-06-24 ENCOUNTER — Inpatient Hospital Stay: Admitting: Licensed Clinical Social Worker

## 2024-06-24 VITALS — BP 123/91 | HR 74 | Resp 16

## 2024-06-24 VITALS — BP 124/88 | HR 99 | Temp 97.3°F | Resp 20 | Wt 305.7 lb

## 2024-06-24 DIAGNOSIS — C711 Malignant neoplasm of frontal lobe: Secondary | ICD-10-CM

## 2024-06-24 DIAGNOSIS — Z5112 Encounter for antineoplastic immunotherapy: Secondary | ICD-10-CM | POA: Diagnosis not present

## 2024-06-24 DIAGNOSIS — G40909 Epilepsy, unspecified, not intractable, without status epilepticus: Secondary | ICD-10-CM

## 2024-06-24 LAB — CBC WITH DIFFERENTIAL (CANCER CENTER ONLY)
Abs Immature Granulocytes: 0.11 10*3/uL — ABNORMAL HIGH (ref 0.00–0.07)
Basophils Absolute: 0 10*3/uL (ref 0.0–0.1)
Basophils Relative: 0 %
Eosinophils Absolute: 0 10*3/uL (ref 0.0–0.5)
Eosinophils Relative: 0 %
HCT: 54.1 % — ABNORMAL HIGH (ref 39.0–52.0)
Hemoglobin: 18.1 g/dL — ABNORMAL HIGH (ref 13.0–17.0)
Immature Granulocytes: 2 %
Lymphocytes Relative: 9 %
Lymphs Abs: 0.6 10*3/uL — ABNORMAL LOW (ref 0.7–4.0)
MCH: 28.5 pg (ref 26.0–34.0)
MCHC: 33.5 g/dL (ref 30.0–36.0)
MCV: 85.3 fL (ref 80.0–100.0)
Monocytes Absolute: 0.3 10*3/uL (ref 0.1–1.0)
Monocytes Relative: 5 %
Neutro Abs: 5.9 10*3/uL (ref 1.7–7.7)
Neutrophils Relative %: 84 %
Platelet Count: 137 10*3/uL — ABNORMAL LOW (ref 150–400)
RBC: 6.34 MIL/uL — ABNORMAL HIGH (ref 4.22–5.81)
RDW: 19.6 % — ABNORMAL HIGH (ref 11.5–15.5)
WBC Count: 7 10*3/uL (ref 4.0–10.5)
nRBC: 0.4 % — ABNORMAL HIGH (ref 0.0–0.2)

## 2024-06-24 MED ORDER — DEXAMETHASONE 1 MG PO TABS: 1.0000 mg | ORAL_TABLET | Freq: Every day | ORAL | 0 refills | Status: DC

## 2024-06-24 MED ORDER — SODIUM CHLORIDE 0.9 % IV SOLN
10.0000 mg/kg | Freq: Once | INTRAVENOUS | Status: AC
  Administered 2024-06-24: 1400 mg via INTRAVENOUS
  Filled 2024-06-24: qty 48

## 2024-06-24 MED ORDER — SODIUM CHLORIDE 0.9 % IV SOLN
INTRAVENOUS | Status: DC
Start: 2024-06-24 — End: 2024-06-24

## 2024-06-24 NOTE — Patient Instructions (Signed)
 [facility] - A DEPT OF [facility]  Discharge Instructions: Thank you for choosing [facility] to provide your oncology and hematology care.   If you have a lab appointment with the Cancer Center, please go directly to the Cancer Center and check in at the registration area.   Wear comfortable clothing and clothing appropriate for easy access to any Portacath or PICC line.   We strive to give you quality time with your provider. You may need to reschedule your appointment if you arrive late (15 or more minutes).  Arriving late affects you and other patients whose appointments are after yours.  Also, if you miss three or more appointments without notifying the office, you may be dismissed from the clinic at the provider's discretion.      For prescription refill requests, have your pharmacy contact our office and allow 72 hours for refills to be completed.    Today you received the following chemotherapy and/or immunotherapy agents: [id number]-awwb      To help prevent nausea and vomiting after your treatment, we encourage you to take your nausea medication as directed.  BELOW ARE SYMPTOMS THAT SHOULD BE REPORTED IMMEDIATELY: *FEVER GREATER THAN 100.4 F (38 C) OR HIGHER *CHILLS OR SWEATING *NAUSEA AND VOMITING THAT IS NOT CONTROLLED WITH YOUR NAUSEA MEDICATION *UNUSUAL SHORTNESS OF BREATH *UNUSUAL BRUISING OR BLEEDING *URINARY PROBLEMS (pain or burning when urinating, or frequent urination) *BOWEL PROBLEMS (unusual diarrhea, constipation, pain near the anus) TENDERNESS IN MOUTH AND THROAT WITH OR WITHOUT PRESENCE OF ULCERS (sore throat, sores in mouth, or a toothache) UNUSUAL RASH, SWELLING OR PAIN  UNUSUAL VAGINAL DISCHARGE OR ITCHING   Items with * indicate a potential emergency and should be followed up as soon as possible or go to the Emergency Department if any problems should occur.  Please show the CHEMOTHERAPY ALERT CARD or  IMMUNOTHERAPY ALERT CARD at check-in to the Emergency Department and triage nurse.  Should you have questions after your visit or need to cancel or reschedule your appointment, please contact [facility] - A DEPT OF [facility]  Dept: [phone]  and follow the prompts.  Office hours are 8:00 a.m. to 4:30 p.m. Monday - Friday. Please note that voicemails left after 4:00 p.m. may not be returned until the following business day.  We are closed weekends and major holidays. You have access to a nurse at all times for urgent questions. Please call the main number to the clinic Dept: [phone] and follow the prompts.   For any non-urgent questions, you may also contact your provider using MyChart. We now offer e-Visits for anyone 12 and older to request care online for non-urgent symptoms. For details visit [email or web address].   Also download the MyChart app! Go to the app store, search "MyChart", open the app, select [place], and log in with your MyChart username and password.

## 2024-06-24 NOTE — Progress Notes (Signed)
 [facility] at [facility] [address]  [place], [place] [address] [phone]   Interval Evaluation  Date of Service: 06/24/24 Patient Name: Nicholas Stevens Patient MRN: [id number] Patient DOB: 24-Feb-1971 Provider: Arthea MARLA Manns, MD  Identifying Statement:  Nicholas Stevens is a 53 y.o. male with right frontal glioblastoma    Oncologic History: Oncology History  Frontal glioblastoma (HCC)  12/18/2023 Surgery   Craniotomy, resection of right frontal mass with Dr. Debby; path is glioblastoma, IDH pending   01/22/2024 - 01/22/2024 Chemotherapy   Patient is on Treatment Plan : BRAIN GLIOBLASTOMA Radiation Therapy With Concurrent Temozolomide  75 mg/m2 Daily Followed By Sequential Maintenance Temozolomide  x 6-12 cycles     06/11/2024 -  Chemotherapy   Patient is on Treatment Plan : BRAIN Temozolomide  D1-5 + Bevacizumab  (10) D1,15 q28d         Interval History: Nicholas Stevens presents today foravastin infusion following recent PET scan.  He remains improved with regards to his left sided weakness.  No longer needing to hold on to the wall or furniture in the home to walk.  No further seizures since Keppra  was increased to 1000mg  twice per day.  Decadron  has decreased to 4mg  daily.  Facial swelling has not improved, he is having patches of dry skin on his face.  Prior- presents for follow up after recent clinical changes.  He describes relatively rapid onset of left sided weakness starting ~10 days ago.  He was unable to walk and use the left arm meaningfully.  No seizure activity appreciated.  Decadron  was started 4mg  twice per day after 3-4 days of the weakness.  Once the steroid was started, he returned back to his prior baseline.  Continues on the Keppra .  Denies severe headaches.   H+P (01/08/24) Patient presented with several days of left sided clumsiness, impaired coordination.  Was found to have large right frontal mass, c/w primary brain  tumor.  He underwent craniotomy, resection with Dr. Debby on 12/18/23; path demonstrated glioblastoma.  Following surgery, he had persistent right sided weakness, he is currently in outpatient PT following discharge from rehab.  No seizures, headaches.     Medications: Current Outpatient Medications on File Prior to Visit  Medication Sig Dispense Refill   apixaban  (ELIQUIS ) 5 MG TABS tablet Take 1 tablet (5 mg total) by mouth 2 (two) times daily. 60 tablet 3   Baclofen  5 MG TABS Take 1 tablet (5 mg total) by mouth every 6 (six) hours as needed (hiccups). 90 tablet 1   camphor-menthol  (SARNA) lotion Apply topically as needed for itching. 222 mL 0   Cyanocobalamin  (B-12 PO) Take 1 tablet by mouth daily.     dexamethasone  (DECADRON ) 4 MG tablet Take 1 tablet (4 mg total) by mouth 2 (two) times daily with a meal. 60 tablet 1   fluconazole  (DIFLUCAN ) 100 MG tablet Take 2 tabs on day#1, then 1 tab daily on Days #2-10 11 tablet 1   furosemide  (LASIX ) 20 MG tablet TAKE 1 TABLET(20 MG) BY MOUTH DAILY 90 tablet 0   latanoprost  (XALATAN ) 0.005 % ophthalmic solution Place 1 drop into both eyes at bedtime. 2.5 mL 2   levETIRAcetam  (KEPPRA ) 500 MG tablet Take 1 tablet (500 mg total) by mouth 2 (two) times daily. (Patient taking differently: Take 1,000 mg by mouth 2 (two) times daily.) 60 tablet 3   magic mouthwash (nystatin , lidocaine , diphenhydrAMINE , alum & mag hydroxide) suspension Swish and swallow 5 mLs by mouth  4 (four) times daily as needed for mouth pain. 140 mL 1   Multiple Vitamin (MULTIVITAMIN WITH MINERALS) TABS tablet Take 1 tablet by mouth daily.     olmesartan  (BENICAR ) 20 MG tablet Take 1 tablet (20 mg total) by mouth daily. 90 tablet 3   Omega-3 Fatty Acids (FISH OIL) 1000 MG CAPS Take 1,000 mg by mouth daily.     pantoprazole  (PROTONIX ) 40 MG tablet Take 1 tablet (40 mg total) by mouth 2 (two) times daily. 180 tablet 3   pimecrolimus  (ELIDEL ) 1 % cream Apply 1 Application topically 2 (two)  times daily as needed (Dermatitis).     Semaglutide -Weight Management (WEGOVY ) 0.25 MG/0.5ML SOAJ Inject 0.25 mg into the skin once a week. 2 mL 2   senna-docusate (SENOKOT-S) 8.6-50 MG tablet Take 2 tablets by mouth daily at 6 (six) AM. 60 tablet 0   temozolomide  (TEMODAR ) 100 MG capsule Take 2 capsules (200 mg total) by mouth daily. (Take with one 180 mg capsule for total daily dose of 380 mg). Take for 5 days on, 23 days off. Repeat every 28 days. May take on an empty stomach to decrease nausea & vomiting. 10 capsule 0   temozolomide  (TEMODAR ) 180 MG capsule Take 1 capsule (180 mg total) by mouth daily. (Take with two 100 mg capsules for total daily dose of 380 mg). Take for 5 days on, 23 days off. Repeat every 28 days. May take on an empty stomach to decrease nausea & vomiting. 5 capsule 0   triamcinolone  cream (KENALOG ) 0.1 % Apply 1 Application topically daily. Use on affected areas for 2 weeks, then stop for 2 weeks. Repeat until clear if needed. 453 g 0   triamterene -hydrochlorothiazide  (MAXZIDE -25) 37.5-25 MG tablet Take 1 tablet by mouth daily. 90 tablet 3   zolpidem  (AMBIEN ) 10 MG tablet TAKE 1 TABLET BY MOUTH EVERYDAY AT BEDTIME 90 tablet 1   No current facility-administered medications on file prior to visit.    Allergies: No Known Allergies Past Medical History:  Past Medical History:  Diagnosis Date   DDD (degenerative disc disease), lumbar    HNP (herniated nucleus pulposus)    Hypertension    Liver hemangioma    Morbid obesity (HCC)    OSA (obstructive sleep apnea)    no cpap used since weight loss   Overweight(278.02)    Plantar fasciitis of right foot    Sleep apnea    Tinea barbae    Past Surgical History:  Past Surgical History:  Procedure Laterality Date   BREATH TEK H PYLORI N/A 08/20/2013   Procedure: BREATH TEK H PYLORI;  Surgeon: Donnice KATHEE Lunger, MD;  Location: [facility];  Service: General;  Laterality: N/A;   COLONOSCOPY WITH PROPOFOL  N/A 05/05/2022    Procedure: COLONOSCOPY WITH PROPOFOL ;  Surgeon: Albertus Gordy HERO, MD;  Location: [facility];  Service: Gastroenterology;  Laterality: N/A;   CRANIOTOMY Right 12/18/2023   Procedure: Right Frontal Stereotactic Craniotomy for Resection of Tumor;  Surgeon: Debby Dorn MATSU, MD;  Location: [facility] OR;  Service: Neurosurgery;  Laterality: Right;   ESOPHAGOGASTRODUODENOSCOPY (EGD) WITH PROPOFOL  N/A 03/15/2023   Procedure: ESOPHAGOGASTRODUODENOSCOPY (EGD) WITH PROPOFOL ;  Surgeon: Avram Lupita BRAVO, MD;  Location: [facility];  Service: Gastroenterology;  Laterality: N/A;   LAPAROSCOPIC APPENDECTOMY  2007   Dr Curvin   LAPAROSCOPIC GASTRIC SLEEVE RESECTION N/A 10/08/2013   Procedure: LAPAROSCOPIC SLEEVE GASTRECTOMY;  Surgeon: Donnice KATHEE Lunger, MD;  Location: [facility];  Service: General;  Laterality: N/A;   LUMBAR  LAMINECTOMY/DECOMPRESSION MICRODISCECTOMY N/A 09/16/2015   Procedure: MICRO LUMBAR DECOMPRESSION, MICRODISCECTOMY L5 - S1;  Surgeon: Reyes Billing, MD;  Location: [facility];  Service: Orthopedics;  Laterality: N/A;   POLYPECTOMY  05/05/2022   Procedure: POLYPECTOMY;  Surgeon: Albertus Gordy HERO, MD;  Location: [facility];  Service: Gastroenterology;;   ROTATOR CUFF REPAIR Right 2005   Dr Harden   Social History:  Social History   Socioeconomic History   Marital status: Married    Spouse name: Miguelangel Korn   Number of children: 3   Years of education: Not on file   Highest education level: 12th grade  Occupational History   Occupation: Doctor, general practice PD    Employer: UNEMPLOYED  Tobacco Use   Smoking status: Never    Passive exposure: Never   Smokeless tobacco: Never  Vaping Use   Vaping status: Never Used  Substance and Sexual Activity   Alcohol  use: No   Drug use: No   Sexual activity: Not on file  Other Topics Concern   Not on file  Social History Narrative   Married 18+ years   3 children - ages approx 2, 9 and 5...daughter age 88 w/ DM type 1   Social Drivers of Corporate investment banker  Strain: Low Risk  (04/02/2023)   Overall Financial Resource Strain (CARDIA)    Difficulty of Paying Living Expenses: Not hard at all  Food Insecurity: No Food Insecurity (12/18/2023)   Hunger Vital Sign    Worried About Running Out of Food in the Last Year: Never true    Ran Out of Food in the Last Year: Never true  Transportation Needs: No Transportation Needs (12/18/2023)   PRAPARE - Administrator, Civil Service (Medical): No    Lack of Transportation (Non-Medical): No  Physical Activity: Insufficiently Active (04/02/2023)   Exercise Vital Sign    Days of Exercise per Week: 4 days    Minutes of Exercise per Session: 30 min  Stress: No Stress Concern Present (04/02/2023)   [employer] of Occupational Health - Occupational Stress Questionnaire    Feeling of Stress : Not at all  Social Connections: Socially Integrated (04/02/2023)   Social Connection and Isolation Panel    Frequency of Communication with Friends and Family: More than three times a week    Frequency of Social Gatherings with Friends and Family: Once a week    Attends Religious Services: More than 4 times per year    Active Member of [employer] or Organizations: Yes    Attends Engineer, structural: More than 4 times per year    Marital Status: Married  Catering manager Violence: Not At Risk (12/18/2023)   Humiliation, Afraid, Rape, and Kick questionnaire    Fear of Current or Ex-Partner: No    Emotionally Abused: No    Physically Abused: No    Sexually Abused: No   Family History:  Family History  Problem Relation Age of Onset   Other Mother        hx of DJD   Liver disease Father        ETOH, Hep C   Hypertension Father    Colon cancer Neg Hx    Colon polyps Neg Hx    Esophageal cancer Neg Hx    Rectal cancer Neg Hx    Stomach cancer Neg Hx     Review of Systems: Constitutional: Doesn't report fevers, chills or abnormal weight loss Eyes: Doesn't report blurriness of vision Ears, nose,  mouth, throat, and face: Doesn't report sore throat Respiratory: Doesn't report cough, dyspnea or wheezes Cardiovascular: Doesn't report palpitation, chest discomfort  Gastrointestinal:  Doesn't report nausea, constipation, diarrhea GU: Doesn't report incontinence Skin: Doesn't report skin rashes Neurological: Per HPI Musculoskeletal: Doesn't report joint pain Behavioral/Psych: Doesn't report anxiety  Physical Exam: Vitals:   06/24/24 1428  BP: 124/88  Pulse: 99  Resp: 20  Temp: (!) 97.3 F (36.3 C)  SpO2: 97%    KPS: 70. General: Alert, cooperative, pleasant, in no acute distress Head: Normal EENT: No conjunctival injection or scleral icterus.  Lungs: Resp effort normal Cardiac: Regular rate Abdomen: Non-distended abdomen Skin: No rashes cyanosis or petechiae. Extremities: No clubbing or edema  Neurologic Exam: Mental Status: Awake, alert, attentive to examiner. Oriented to self and environment. Language is fluent with intact comprehension.  Cranial Nerves: Visual acuity is grossly normal. Visual fields are full. Extra-ocular movements intact. No ptosis. Face is symmetric Motor: Tone and bulk are normal. Power is 4+/5 in left arm and 4/5 leg. Reflexes are symmetric, no pathologic reflexes present.  Sensory: Intact to light touch Gait: Hemiparetic   Labs: I have reviewed the data as listed    Component Value Date/Time   NA 138 06/11/2024 1442   K 4.2 06/11/2024 1442   CL 104 06/11/2024 1442   CO2 25 06/11/2024 1442   GLUCOSE 146 (H) 06/11/2024 1442   BUN 28 (H) 06/11/2024 1442   CREATININE 1.08 06/11/2024 1442   CALCIUM 9.5 06/11/2024 1442   PROT 6.7 06/11/2024 1442   ALBUMIN 4.0 06/11/2024 1442   AST 26 06/11/2024 1442   ALT 81 (H) 06/11/2024 1442   ALKPHOS 40 06/11/2024 1442   BILITOT 0.7 06/11/2024 1442   GFRNONAA >60 06/11/2024 1442   GFRAA >60 09/15/2015 1520   Lab Results  Component Value Date   WBC 7.0 06/24/2024   NEUTROABS 5.9 06/24/2024   HGB  18.1 (H) 06/24/2024   HCT 54.1 (H) 06/24/2024   MCV 85.3 06/24/2024   PLT 137 (L) 06/24/2024    Imaging:  [facility] Clinician Interpretation: I have personally reviewed the CNS images as listed.  My interpretation, in the context of the patient's clinical presentation, is likely treatment effect  NM PET Metabolic Brain Result Date: 06/17/2024 CLINICAL DATA:  Dementia. Frontotemporal dementia versus Alzheimer's type dementia EXAM: NM PET METABOLIC BRAIN TECHNIQUE: 10.0 mCi F-18 FDG was injected intravenously. Full-ring PET imaging was performed from the vertex to skull base. CT data was obtained and used for attenuation correction and anatomic localization. FASTING BLOOD GLUCOSE:  Value: 98 mg/dl COMPARISON:  Brain MRI 06/04/1999 FINDINGS: The large lesion in the superior RIGHT frontal lobe which demonstrates avid peripheral enhancement on contrast MRI does not have significant metabolic activity associated with the lesion. There is minimal mild central activity within the lesion that is less than normal cortical activity. There is generalized cortical hypodensity within the more anterolateral frontal lobe related to treatment effects and or edema. IMPRESSION: Minimal metabolic activity associated with the enhancing lesion in the high anterior RIGHT frontal lobe. Findings are more suggestive post treatment change/tumor necrosis rather than tumor recurrence. Of note saved fused PET/MRI images are appended on the MRI exam from 06/03/2024. Electronically Signed   By: Jackquline Boxer M.D.   On: 06/17/2024 16:22   MR BRAIN W WO CONTRAST Result Date: 06/03/2024 CLINICAL DATA:  Brain/CNS neoplasm, assess treatment response. Right frontal glioblastoma. EXAM: MRI HEAD WITHOUT AND WITH CONTRAST TECHNIQUE: Multiplanar, multiecho pulse sequences of the  brain and surrounding structures were obtained without and with intravenous contrast. CONTRAST:  10mL GADAVIST  GADOBUTROL  1 MMOL/ML IV SOLN COMPARISON:  Head MRI  03/15/2024 FINDINGS: Brain: Sequelae of right frontal craniotomy and tumor resection are again identified. The enhancing, necrotic mass containing chronic blood products at the right frontal resection site has further enlarged, now measuring 2.7 x 4.8 x 4.8 cm. Peripheral enhancement of the mass has increased in thickness. There is increased, extensive nonenhancing T2 hyperintensity in the surrounding right frontal white matter extending into the anterior right parietal lobe, corpus callosum, and external capsule with increased mass effect including partial effacement of the right lateral ventricle and 3 mm of leftward midline shift. More inferiorly, infiltrative nonenhancing T2 hyperintensity compatible with tumor involving cortex in the medial right frontal lobe and insula has not significantly changed. No acute infarct, hydrocephalus, or significant extra-axial fluid collection is identified. Vascular: Major intracranial vascular flow voids are preserved. Skull and upper cervical spine: Right frontal craniotomy. No suspicious marrow lesion. Sinuses/Orbits: Unremarkable orbits. Paranasal sinuses and mastoid air cells are clear. Other: None. IMPRESSION: Further enlargement of the necrotic right frontal mass with increased edema which may reflect worsening post treatment changes or tumor. Increased mass effect with 3 mm of leftward midline shift. Electronically Signed   By: Dasie Hamburg M.D.   On: 06/03/2024 14:26   Assessment/Plan Frontal glioblastoma (HCC)  Seizure disorder (HCC)  Nicholas Stevens is clinically stable today, here for avastin  infusion.  Cycle #3 Temodar  was dosed on 06/09/24.  FDG-PET was suggestive of pseudo-progression.  Will still plan to repeat MRI after this cycle.  Ok to proceed with avastin  10mg /kg initiation today.  We recommended continuing treatment with cycle #3 Temozolomide , dose reduced to 150 mg/m2, on for five days and off for twenty three days in twenty eight day  cycles. The patient will have a complete blood count performed on days 21 and 28 of each cycle, and a comprehensive metabolic panel performed on day 28 of each cycle. Labs may need to be performed more often. Zofran  will prescribed for home use for nausea/vomiting.   Informed consent was obtained verbally at bedside to proceed with oral chemotherapy.  Chemotherapy should be held for the following:  ANC less than 1,000  Platelets less than 100,000  LFT or creatinine greater than 2x ULN  If clinical concerns/contraindications develop  Decadron  should decrease to 2mg  daily x7 days, then 1mg  x5 days, then stopped if tolerated.  Will con't Eliquis  5mg  BID for DVT.  We ask that Nicholas Stevens return to clinic in 2 weeks for MRI review prior to cycle #4, day 1/28, or sooner as needed.  All questions were answered. The patient knows to call the clinic with any problems, questions or concerns. No barriers to learning were detected.  The total time spent in the encounter was 40 minutes and more than 50% was on counseling and review of test results   Arthea MARLA Manns, MD Medical Director of Neuro-Oncology [facility] 06/24/24 2:45 PM

## 2024-06-25 ENCOUNTER — Encounter: Payer: Self-pay | Admitting: Internal Medicine

## 2024-06-25 ENCOUNTER — Other Ambulatory Visit: Payer: Self-pay | Admitting: Internal Medicine

## 2024-06-25 DIAGNOSIS — C711 Malignant neoplasm of frontal lobe: Secondary | ICD-10-CM

## 2024-06-25 NOTE — Progress Notes (Signed)
 [facility] CSW Progress Note  Visual merchandiser met with patient to follow-up on emotional support.    Interventions: Pt in infusion receiving his second treatment.  Pt reports feeling poorly immediately following the last treatment, but believes there is slight improvement.  Pt and spouse remain optimistic.  Pt remains active as he is able and reports being overwhelmed by the support he and his spouse have received from their community and friends.  Pt not yet up to completing assessments.  CSW will follow up w/ pt in two weeks during infusion to check in.        Follow Up Plan:  CSW will see pt at next infusion.    Devere JONELLE Manna, LCSW Clinical Social Worker [facility]

## 2024-06-26 ENCOUNTER — Telehealth: Payer: Self-pay | Admitting: Internal Medicine

## 2024-06-26 NOTE — Telephone Encounter (Signed)
 Scheduled appointments per WQ. Called and left a VM with appointment details for the patient.

## 2024-06-27 ENCOUNTER — Other Ambulatory Visit: Payer: Self-pay

## 2024-07-01 ENCOUNTER — Encounter: Payer: Self-pay | Admitting: Internal Medicine

## 2024-07-02 ENCOUNTER — Other Ambulatory Visit: Payer: Self-pay

## 2024-07-02 ENCOUNTER — Other Ambulatory Visit: Payer: Self-pay | Admitting: Internal Medicine

## 2024-07-02 ENCOUNTER — Telehealth: Payer: Self-pay | Admitting: *Deleted

## 2024-07-02 ENCOUNTER — Ambulatory Visit (HOSPITAL_COMMUNITY)
Admission: RE | Admit: 2024-07-02 | Discharge: 2024-07-02 | Disposition: A | Discharge status: Home / Self Care | Source: Ambulatory Visit | Attending: Internal Medicine | Admitting: Internal Medicine

## 2024-07-02 DIAGNOSIS — C711 Malignant neoplasm of frontal lobe: Secondary | ICD-10-CM

## 2024-07-02 DIAGNOSIS — R197 Diarrhea, unspecified: Secondary | ICD-10-CM

## 2024-07-02 MED ORDER — GADOBUTROL 1 MMOL/ML IV SOLN
10.0000 mL | Freq: Once | INTRAVENOUS | Status: AC | PRN
  Administered 2024-07-02: 10 mL via INTRAVENOUS

## 2024-07-02 NOTE — Progress Notes (Unsigned)
 Symptom Management Consult Note [facility]    Patient Care Team: Plotnikov, Karlynn GAILS, MD as PCP - General (Internal Medicine) Himmelrich, Camie RAMAN, RD (Inactive) as Dietitian [facility])    Name / MRN / DOBZackery Stevens  [id number]  [birth year]   Date of visit: 07/03/2024   Chief Complaint/Reason for visit: diarrhea   Current Therapy: Mvasi   Last treatment:  Day 15   Cycle 1 on 06/24/24    ASSESSMENT AND PLAN Patient is a 53 y.o. male with oncologic history of right frontal glioblastoma followed by Dr. Buckley.  I have viewed most recent oncology note and lab work.  #Right frontal glioblastoma - Next appointment with oncologist is 07/08/24  #Diarrhea - Acute diarrhea likely due to Mvasi  side effects although also considering viral/bacterial infection - Patient received 1L NS in clinic today for hydration support. - CMP without significant electrolyte derangement., no renal insufficiency. K is 3.4. Would consider PO potassium replacement if diarrhea persists. - Order GI PCR stool panel and C diff testing. - Discussed how to take Imodium if stool testing is negative. Can consider Lomotil if Imodium is ineffective. - Advise on BRAT diet and increased fluid intake. - Patient with soft BP on arrival 95/60. BP improved after IVF. Benign abdominal exam. Instruct to monitor blood pressure at home, withhold antihypertensive if systolic <110 mmHg. - Discussed supportive care with IVF if diarrhea continues with future treatments.   #Leukopenia and thrombocytopenia - AE of treatment. - Stable today. Discussed neutropenic and bleeding precautions. No active bleeding.   Strict ED precautions discussed should symptoms worsen.   HEME/ONC HISTORY Oncology History  Frontal glioblastoma (HCC)  12/18/2023 Surgery   Craniotomy, resection of right frontal mass with Dr. Debby; path is glioblastoma, IDH pending   01/22/2024 - 01/22/2024 Chemotherapy   Patient is on  Treatment Plan : BRAIN GLIOBLASTOMA Radiation Therapy With Concurrent Temozolomide  75 mg/m2 Daily Followed By Sequential Maintenance Temozolomide  x 6-12 cycles     06/11/2024 -  Chemotherapy   Patient is on Treatment Plan : BRAIN Temozolomide  D1-5 + Bevacizumab  (10) D1,15 q28d         INTERVAL HISTORY  Discussed the use of AI scribe software for clinical note transcription with the patient, who gave verbal consent to proceed.    Nicholas Stevens is a 53 y.o. male with oncologic history as above presenting to [facility] today with chief complaint of diarrhea. Accompanied to clinic today by spouse who provides additional history.  He has been experiencing diarrhea for three days, started on Sunday. The diarrhea is mostly liquid and brown, occurring five to six times a day. He took Imodium once yesterday, but it did not alleviate the symptoms. Intense cramping occurs before bowel movements, which subsides afterward. No fever, nausea or vomiting, but he reports chills and cold sweats. He has been taking Tylenol  for a light headache, which is not present at the moment.  His fluid intake has decreased to about 24 to 40 ounces per day from his usual 64 ounces due to feeling unwell. He has been able to eat some fruit, oatmeal, and rice, but reports feeling weak, particularly in his legs, which he describes as 'spaghetti legs'. He has not experienced diarrhea during previous treatments and has not had any recent takeout or travel. He suspects a possible stomach virus, potentially brought home by his four-year-old grandchild who lives with him and recently attended a birthday party.  He has not used antibiotics recently. His  blood pressure has been low. He is on blood pressure medication, Triumvirate, taken once daily at night, and has a history of using Lasix , which he is not currently taking.      ROS  All other systems are reviewed and are negative for acute change except as noted in the HPI.    No  Known Allergies   Past Medical History:  Diagnosis Date   DDD (degenerative disc disease), lumbar    HNP (herniated nucleus pulposus)    Hypertension    Liver hemangioma    Morbid obesity (HCC)    OSA (obstructive sleep apnea)    no cpap used since weight loss   Overweight(278.02)    Plantar fasciitis of right foot    Sleep apnea    Tinea barbae      Past Surgical History:  Procedure Laterality Date   BREATH TEK H PYLORI N/A 08/20/2013   Procedure: BREATH TEK H PYLORI;  Surgeon: Donnice KATHEE Lunger, MD;  Location: [facility];  Service: General;  Laterality: N/A;   COLONOSCOPY WITH PROPOFOL  N/A 05/05/2022   Procedure: COLONOSCOPY WITH PROPOFOL ;  Surgeon: Albertus Gordy HERO, MD;  Location: [facility];  Service: Gastroenterology;  Laterality: N/A;   CRANIOTOMY Right 12/18/2023   Procedure: Right Frontal Stereotactic Craniotomy for Resection of Tumor;  Surgeon: Debby Dorn MATSU, MD;  Location: [facility];  Service: Neurosurgery;  Laterality: Right;   ESOPHAGOGASTRODUODENOSCOPY (EGD) WITH PROPOFOL  N/A 03/15/2023   Procedure: ESOPHAGOGASTRODUODENOSCOPY (EGD) WITH PROPOFOL ;  Surgeon: Avram Lupita BRAVO, MD;  Location: [facility];  Service: Gastroenterology;  Laterality: N/A;   LAPAROSCOPIC APPENDECTOMY  2007   Dr Curvin   LAPAROSCOPIC GASTRIC SLEEVE RESECTION N/A 10/08/2013   Procedure: LAPAROSCOPIC SLEEVE GASTRECTOMY;  Surgeon: Donnice KATHEE Lunger, MD;  Location: [facility];  Service: General;  Laterality: N/A;   LUMBAR LAMINECTOMY/DECOMPRESSION MICRODISCECTOMY N/A 09/16/2015   Procedure: MICRO LUMBAR DECOMPRESSION, MICRODISCECTOMY L5 - S1;  Surgeon: Reyes Billing, MD;  Location: [facility];  Service: Orthopedics;  Laterality: N/A;   POLYPECTOMY  05/05/2022   Procedure: POLYPECTOMY;  Surgeon: Albertus Gordy HERO, MD;  Location: [facility];  Service: Gastroenterology;;   ROTATOR CUFF REPAIR Right 2005   Dr Harden    Social History   Socioeconomic History   Marital status: Married    Spouse name: Nicholas Stevens    Number of children: 3   Years of education: Not on file   Highest education level: 12th grade  Occupational History   Occupation: Doctor, general practice PD    Employer: UNEMPLOYED  Tobacco Use   Smoking status: Never    Passive exposure: Never   Smokeless tobacco: Never  Vaping Use   Vaping status: Never Used  Substance and Sexual Activity   Alcohol  use: No   Drug use: No   Sexual activity: Not on file  Other Topics Concern   Not on file  Social History Narrative   Married 18+ years   3 children - ages approx 64, 28 and 5...daughter age 33 w/ DM type 1   Social Drivers of Corporate investment banker Strain: Low Risk  (04/02/2023)   Overall Financial Resource Strain (CARDIA)    Difficulty of Paying Living Expenses: Not hard at all  Food Insecurity: No Food Insecurity (12/18/2023)   Hunger Vital Sign    Worried About Running Out of Food in the Last Year: Never true    Ran Out of Food in the Last Year: Never true  Transportation Needs: No Transportation Needs (12/18/2023)   PRAPARE -  Administrator, Civil Service (Medical): No    Lack of Transportation (Non-Medical): No  Physical Activity: Insufficiently Active (04/02/2023)   Exercise Vital Sign    Days of Exercise per Week: 4 days    Minutes of Exercise per Session: 30 min  Stress: No Stress Concern Present (04/02/2023)   [employer] of Occupational Health - Occupational Stress Questionnaire    Feeling of Stress : Not at all  Social Connections: Socially Integrated (04/02/2023)   Social Connection and Isolation Panel    Frequency of Communication with Friends and Family: More than three times a week    Frequency of Social Gatherings with Friends and Family: Once a week    Attends Religious Services: More than 4 times per year    Active Member of [employer] or Organizations: Yes    Attends Engineer, structural: More than 4 times per year    Marital Status: Married  Catering manager Violence: Not At Risk (12/18/2023)    Humiliation, Afraid, Rape, and Kick questionnaire    Fear of Current or Ex-Partner: No    Emotionally Abused: No    Physically Abused: No    Sexually Abused: No    Family History  Problem Relation Age of Onset   Other Mother        hx of DJD   Liver disease Father        ETOH, Hep C   Hypertension Father    Colon cancer Neg Hx    Colon polyps Neg Hx    Esophageal cancer Neg Hx    Rectal cancer Neg Hx    Stomach cancer Neg Hx      Current Outpatient Medications:    apixaban  (ELIQUIS ) 5 MG TABS tablet, Take 1 tablet (5 mg total) by mouth 2 (two) times daily., Disp: 60 tablet, Rfl: 3   Baclofen  5 MG TABS, Take 1 tablet (5 mg total) by mouth every 6 (six) hours as needed (hiccups)., Disp: 90 tablet, Rfl: 1   camphor-menthol  (SARNA) lotion, Apply topically as needed for itching., Disp: 222 mL, Rfl: 0   Cyanocobalamin  (B-12 PO), Take 1 tablet by mouth daily., Disp: , Rfl:    dexamethasone  (DECADRON ) 1 MG tablet, Take 1 tablet (1 mg total) by mouth daily with breakfast., Disp: 30 tablet, Rfl: 0   fluconazole  (DIFLUCAN ) 100 MG tablet, Take 2 tabs on day#1, then 1 tab daily on Days #2-10, Disp: 11 tablet, Rfl: 1   furosemide  (LASIX ) 20 MG tablet, TAKE 1 TABLET(20 MG) BY MOUTH DAILY, Disp: 90 tablet, Rfl: 0   latanoprost  (XALATAN ) 0.005 % ophthalmic solution, Place 1 drop into both eyes at bedtime., Disp: 2.5 mL, Rfl: 2   levETIRAcetam  (KEPPRA ) 500 MG tablet, Take 1 tablet (500 mg total) by mouth 2 (two) times daily. (Patient taking differently: Take 1,000 mg by mouth 2 (two) times daily.), Disp: 60 tablet, Rfl: 3   magic mouthwash (nystatin , lidocaine , diphenhydrAMINE , alum & mag hydroxide) suspension, Swish and swallow 5 mLs by mouth 4 (four) times daily as needed for mouth pain., Disp: 140 mL, Rfl: 1   Multiple Vitamin (MULTIVITAMIN WITH MINERALS) TABS tablet, Take 1 tablet by mouth daily., Disp: , Rfl:    olmesartan  (BENICAR ) 20 MG tablet, Take 1 tablet (20 mg total) by mouth daily.,  Disp: 90 tablet, Rfl: 3   Omega-3 Fatty Acids (FISH OIL) 1000 MG CAPS, Take 1,000 mg by mouth daily., Disp: , Rfl:    pantoprazole  (PROTONIX ) 40 MG tablet, Take  1 tablet (40 mg total) by mouth 2 (two) times daily., Disp: 180 tablet, Rfl: 3   pimecrolimus  (ELIDEL ) 1 % cream, Apply 1 Application topically 2 (two) times daily as needed (Dermatitis)., Disp: , Rfl:    Semaglutide -Weight Management (WEGOVY ) 0.25 MG/0.5ML SOAJ, Inject 0.25 mg into the skin once a week., Disp: 2 mL, Rfl: 2   senna-docusate (SENOKOT-S) 8.6-50 MG tablet, Take 2 tablets by mouth daily at 6 (six) AM., Disp: 60 tablet, Rfl: 0   temozolomide  (TEMODAR ) 100 MG capsule, Take 2 capsules (200 mg total) by mouth daily. (Take with one 180 mg capsule for total daily dose of 380 mg). Take for 5 days on, 23 days off. Repeat every 28 days. May take on an empty stomach to decrease nausea & vomiting., Disp: 10 capsule, Rfl: 0   temozolomide  (TEMODAR ) 180 MG capsule, Take 1 capsule (180 mg total) by mouth daily. (Take with two 100 mg capsules for total daily dose of 380 mg). Take for 5 days on, 23 days off. Repeat every 28 days. May take on an empty stomach to decrease nausea & vomiting., Disp: 5 capsule, Rfl: 0   triamcinolone  cream (KENALOG ) 0.1 %, Apply 1 Application topically daily. Use on affected areas for 2 weeks, then stop for 2 weeks. Repeat until clear if needed., Disp: 453 g, Rfl: 0   triamterene -hydrochlorothiazide  (MAXZIDE -25) 37.5-25 MG tablet, Take 1 tablet by mouth daily., Disp: 90 tablet, Rfl: 3   zolpidem  (AMBIEN ) 10 MG tablet, TAKE 1 TABLET BY MOUTH EVERYDAY AT BEDTIME, Disp: 90 tablet, Rfl: 1  PHYSICAL EXAM ECOG FS:1 - Symptomatic but completely ambulatory    Vitals:   07/03/24 0900 07/03/24 1138  BP: 95/60 103/70  Pulse: 88 91  Resp: 17 16  Temp: (!) 97.4 F (36.3 C)   TempSrc: Temporal   SpO2: 96% 97%  Weight: (!) 304 lb (137.9 kg)    Physical Exam Vitals and nursing note reviewed.  Constitutional:       Appearance: He is not ill-appearing or toxic-appearing.  HENT:     Head: Normocephalic.     Mouth/Throat:     Mouth: Mucous membranes are dry.     Pharynx: No oropharyngeal exudate or posterior oropharyngeal erythema.  Eyes:     Conjunctiva/sclera: Conjunctivae normal.  Cardiovascular:     Rate and Rhythm: Normal rate and regular rhythm.     Pulses: Normal pulses.     Heart sounds: Normal heart sounds.  Pulmonary:     Effort: Pulmonary effort is normal.     Breath sounds: Normal breath sounds.  Abdominal:     General: There is no distension.  Musculoskeletal:     Cervical back: Normal range of motion.  Skin:    General: Skin is warm and dry.  Neurological:     Mental Status: He is alert.        LABORATORY DATA I have reviewed the data as listed    Latest Ref Rng & Units 07/03/2024    8:43 AM 06/24/2024    2:06 PM 06/11/2024    2:42 PM  CBC  WBC 4.0 - 10.5 K/uL 2.9  7.0  8.3   Hemoglobin 13.0 - 17.0 g/dL 83.1  81.8  80.9   Hematocrit 39.0 - 52.0 % 49.9  54.1  56.5   Platelets 150 - 400 K/uL 121  137  207         Latest Ref Rng & Units 07/03/2024    8:43 AM 06/11/2024  2:42 PM 06/04/2024    2:52 PM  CMP  Glucose 70 - 99 mg/dL 899  853  79   BUN 6 - 20 mg/dL 9  28  19    Creatinine 0.61 - 1.24 mg/dL 8.83  8.91  8.85   Sodium 135 - 145 mmol/L 139  138  139   Potassium 3.5 - 5.1 mmol/L 3.4  4.2  4.0   Chloride 98 - 111 mmol/L 102  104  106   CO2 22 - 32 mmol/L 29  25  27    Calcium 8.9 - 10.3 mg/dL 9.1  9.5  9.3   Total Protein 6.5 - 8.1 g/dL 6.7  6.7  6.7   Total Bilirubin 0.0 - 1.2 mg/dL 1.5  0.7  0.6   Alkaline Phos 38 - 126 U/L 36  40  39   AST 15 - 41 U/L 22  26  19    ALT 0 - 44 U/L 59  81  47        RADIOGRAPHIC STUDIES (from last 24 hours if applicable) I have personally reviewed the radiological images as listed and agreed with the findings in the report. MR BRAIN W WO CONTRAST Result Date: 07/02/2024 CLINICAL DATA:  Brain/CNS neoplasm, assess treatment  response EXAM: MRI HEAD WITHOUT AND WITH CONTRAST TECHNIQUE: Multiplanar, multiecho pulse sequences of the brain and surrounding structures were obtained without and with intravenous contrast. CONTRAST:  10mL GADAVIST  GADOBUTROL  1 MMOL/ML IV SOLN COMPARISON:  MRI of the head dated June 03, 2024. FINDINGS: Brain: The patient is again noted to be status post right frontal parietal craniotomy for resection of lesion within the right posteromedial frontal lobe. There has been significant interval decrease in size of the surgical resection cavity from approximately 2.7 x 4.8 x 4.8 cm to approximately 1.9 x 3.0 x 3.4 cm. There is also significantly less peripheral enhancement now present. There is markedly less surrounding cerebral edema and mass effect. There is no longer shift of the midline structures to the left. There is residual increased T2 signal present throughout the white matter of the right frontal lobe. There is residual hemosiderin staining around the margins of the resection cavity. Vascular: Normal vascular flow voids. Skull and upper cervical spine: Status post right frontal parietal craniotomy. Normal bone marrow signal. No osseous lesions. Sinuses/Orbits: Clear paranasal sinuses.  Normal orbits. Other: None. IMPRESSION: 1. There has been significant interval improvement of the surgical resection cavity in the right posterior frontal lobe. The cavity has decreased in size and demonstrates markedly less peripheral enhancement. There has also been interval marked improvement of extensive cerebral edema. The findings are compatible with a positive response to therapy. Electronically Signed   By: Evalene Coho M.D.   On: 07/02/2024 13:50        Visit Diagnosis: 1. Diarrhea, unspecified type   2. Frontal glioblastoma (HCC)   3. Thrombocytopenia (HCC)   4. Chemotherapy-induced neutropenia (HCC)      No orders of the defined types were placed in this encounter.   All questions were answered.  The patient knows to call the clinic with any problems, questions or concerns. No barriers to learning was detected.  A total of more than 30 minutes were spent on this encounter with face-to-face time and non-face-to-face time, including preparing to see the patient, ordering tests and/or medications, counseling the patient and coordination of care as outlined above.    Thank you for allowing me to participate in the care of this patient.  Michelina Mexicano E  Walisiewicz, PA-C Department of Hematology/Oncology [facility] at [facility] Phone: [phone]  [phone]    07/03/2024 12:15 PM

## 2024-07-02 NOTE — Telephone Encounter (Signed)
 PC to patient's wife, Adonna, to obtain more information - according to MyChart message, patient has been having diarrhea for the last 2-3 days, every time he eats, no N&V. He has had chills but no fever. He had a sore throat 3 days ago but not currently, no cough or congestion, no dysuria. He had a negative home covid test today. He has abdominal pain & is very fatigued. He is drinking but not eating, he has not taken immodium.  Per Dr Buckley, patient may be seen in [facility].  Myles Adonna that patient may take immodium as directed on the box & he needs to continue drinking as much fluid as possible.  [name] informed of [facility] appointments tomorrow, labs at 8:30 & [facility] at 9:00.  She verbalizes understanding.

## 2024-07-03 ENCOUNTER — Inpatient Hospital Stay (HOSPITAL_BASED_OUTPATIENT_CLINIC_OR_DEPARTMENT_OTHER): Admitting: Physician Assistant

## 2024-07-03 ENCOUNTER — Inpatient Hospital Stay: Attending: Radiation Oncology

## 2024-07-03 ENCOUNTER — Inpatient Hospital Stay

## 2024-07-03 VITALS — BP 103/70 | HR 91 | Temp 97.4°F | Resp 16 | Wt 304.0 lb

## 2024-07-03 DIAGNOSIS — Z5112 Encounter for antineoplastic immunotherapy: Secondary | ICD-10-CM | POA: Insufficient documentation

## 2024-07-03 DIAGNOSIS — D701 Agranulocytosis secondary to cancer chemotherapy: Secondary | ICD-10-CM

## 2024-07-03 DIAGNOSIS — T451X5A Adverse effect of antineoplastic and immunosuppressive drugs, initial encounter: Secondary | ICD-10-CM

## 2024-07-03 DIAGNOSIS — R197 Diarrhea, unspecified: Secondary | ICD-10-CM

## 2024-07-03 DIAGNOSIS — D696 Thrombocytopenia, unspecified: Secondary | ICD-10-CM

## 2024-07-03 DIAGNOSIS — C711 Malignant neoplasm of frontal lobe: Secondary | ICD-10-CM | POA: Insufficient documentation

## 2024-07-03 LAB — CBC WITH DIFFERENTIAL (CANCER CENTER ONLY)
Abs Immature Granulocytes: 0.02 K/uL (ref 0.00–0.07)
Basophils Absolute: 0 K/uL (ref 0.0–0.1)
Basophils Relative: 1 %
Eosinophils Absolute: 0 K/uL (ref 0.0–0.5)
Eosinophils Relative: 1 %
HCT: 49.9 % (ref 39.0–52.0)
Hemoglobin: 16.8 g/dL (ref 13.0–17.0)
Immature Granulocytes: 1 %
Lymphocytes Relative: 21 %
Lymphs Abs: 0.6 K/uL — ABNORMAL LOW (ref 0.7–4.0)
MCH: 29.1 pg (ref 26.0–34.0)
MCHC: 33.7 g/dL (ref 30.0–36.0)
MCV: 86.5 fL (ref 80.0–100.0)
Monocytes Absolute: 0.3 K/uL (ref 0.1–1.0)
Monocytes Relative: 11 %
Neutro Abs: 1.9 K/uL (ref 1.7–7.7)
Neutrophils Relative %: 65 %
Platelet Count: 121 K/uL — ABNORMAL LOW (ref 150–400)
RBC: 5.77 MIL/uL (ref 4.22–5.81)
RDW: 17.3 % — ABNORMAL HIGH (ref 11.5–15.5)
WBC Count: 2.9 K/uL — ABNORMAL LOW (ref 4.0–10.5)
nRBC: 0 % (ref 0.0–0.2)

## 2024-07-03 LAB — GASTROINTESTINAL PANEL BY PCR, STOOL (REPLACES STOOL CULTURE)

## 2024-07-03 LAB — CMP (CANCER CENTER ONLY)
ALT: 59 U/L — ABNORMAL HIGH (ref 0–44)
AST: 22 U/L (ref 15–41)
Albumin: 3.8 g/dL (ref 3.5–5.0)
Alkaline Phosphatase: 36 U/L — ABNORMAL LOW (ref 38–126)
Anion gap: 8 (ref 5–15)
BUN: 9 mg/dL (ref 6–20)
CO2: 29 mmol/L (ref 22–32)
Calcium: 9.1 mg/dL (ref 8.9–10.3)
Chloride: 102 mmol/L (ref 98–111)
Creatinine: 1.16 mg/dL (ref 0.61–1.24)
GFR, Estimated: >60 mL/min (ref >60)
Glucose, Bld: 100 mg/dL — ABNORMAL HIGH (ref 70–99)
Potassium: 3.4 mmol/L — ABNORMAL LOW (ref 3.5–5.1)
Sodium: 139 mmol/L (ref 135–145)
Total Bilirubin: 1.5 mg/dL — ABNORMAL HIGH (ref 0.0–1.2)
Total Protein: 6.7 g/dL (ref 6.5–8.1)

## 2024-07-03 LAB — C DIFFICILE QUICK SCREEN W PCR REFLEX
C Diff antigen: NEGATIVE
C Diff interpretation: NOT DETECTED
C Diff toxin: NEGATIVE

## 2024-07-03 LAB — MAGNESIUM: Magnesium: 1.9 mg/dL (ref 1.7–2.4)

## 2024-07-03 MED ORDER — SODIUM CHLORIDE 0.9 % IV SOLN: Freq: Once | INTRAVENOUS | Status: AC

## 2024-07-03 NOTE — Patient Instructions (Signed)

## 2024-07-03 NOTE — Patient Instructions (Addendum)
 Check your blood pressure before you take your blood pressure medicine at night. If the systolic number (top number) is below 110 you should skip that dose.   If you continue to have diarrhea please let us  know on Thursday and we can prescribe potassium tablets.  The stool sample testing should result tomorrow. If it is all negative then you can take Imodium for the diarrhea. Take it as directed on the package. If diarrhea continues despite taking the maximum amount of Imodium daily then please let us  know and I will prescribe lomotil.

## 2024-07-05 ENCOUNTER — Other Ambulatory Visit: Payer: Self-pay | Admitting: *Deleted

## 2024-07-05 DIAGNOSIS — C711 Malignant neoplasm of frontal lobe: Secondary | ICD-10-CM

## 2024-07-08 ENCOUNTER — Other Ambulatory Visit: Payer: Self-pay | Admitting: Physician Assistant

## 2024-07-08 ENCOUNTER — Inpatient Hospital Stay (HOSPITAL_BASED_OUTPATIENT_CLINIC_OR_DEPARTMENT_OTHER): Admitting: Internal Medicine

## 2024-07-08 ENCOUNTER — Inpatient Hospital Stay

## 2024-07-08 VITALS — BP 118/84 | HR 85 | Temp 97.5°F | Resp 20 | Wt 300.6 lb

## 2024-07-08 DIAGNOSIS — G40909 Epilepsy, unspecified, not intractable, without status epilepticus: Secondary | ICD-10-CM

## 2024-07-08 DIAGNOSIS — C711 Malignant neoplasm of frontal lobe: Secondary | ICD-10-CM | POA: Diagnosis not present

## 2024-07-08 DIAGNOSIS — Z5112 Encounter for antineoplastic immunotherapy: Secondary | ICD-10-CM | POA: Diagnosis not present

## 2024-07-08 LAB — CMP (CANCER CENTER ONLY)
ALT: 42 U/L (ref 0–44)
AST: 22 U/L (ref 15–41)
Albumin: 3.9 g/dL (ref 3.5–5.0)
Alkaline Phosphatase: 37 U/L — ABNORMAL LOW (ref 38–126)
Anion gap: 7 (ref 5–15)
BUN: 10 mg/dL (ref 6–20)
CO2: 28 mmol/L (ref 22–32)
Calcium: 9 mg/dL (ref 8.9–10.3)
Chloride: 103 mmol/L (ref 98–111)
Creatinine: 1.09 mg/dL (ref 0.61–1.24)
GFR, Estimated: >60 mL/min (ref >60)
Glucose, Bld: 91 mg/dL (ref 70–99)
Potassium: 3.4 mmol/L — ABNORMAL LOW (ref 3.5–5.1)
Sodium: 138 mmol/L (ref 135–145)
Total Bilirubin: 1 mg/dL (ref 0.0–1.2)
Total Protein: 7 g/dL (ref 6.5–8.1)

## 2024-07-08 LAB — CBC WITH DIFFERENTIAL (CANCER CENTER ONLY)
Abs Immature Granulocytes: 0.03 K/uL (ref 0.00–0.07)
Basophils Absolute: 0 K/uL (ref 0.0–0.1)
Basophils Relative: 1 %
Eosinophils Absolute: 0 K/uL (ref 0.0–0.5)
Eosinophils Relative: 1 %
HCT: 46.4 % (ref 39.0–52.0)
Hemoglobin: 15.5 g/dL (ref 13.0–17.0)
Immature Granulocytes: 1 %
Lymphocytes Relative: 27 %
Lymphs Abs: 0.9 K/uL (ref 0.7–4.0)
MCH: 29.2 pg (ref 26.0–34.0)
MCHC: 33.4 g/dL (ref 30.0–36.0)
MCV: 87.4 fL (ref 80.0–100.0)
Monocytes Absolute: 0.5 K/uL (ref 0.1–1.0)
Monocytes Relative: 13 %
Neutro Abs: 1.9 K/uL (ref 1.7–7.7)
Neutrophils Relative %: 57 %
Platelet Count: 230 K/uL (ref 150–400)
RBC: 5.31 MIL/uL (ref 4.22–5.81)
RDW: 16.2 % — ABNORMAL HIGH (ref 11.5–15.5)
WBC Count: 3.5 K/uL — ABNORMAL LOW (ref 4.0–10.5)
nRBC: 0 % (ref 0.0–0.2)

## 2024-07-08 MED ORDER — DIPHENOXYLATE-ATROPINE 2.5-0.025 MG PO TABS: 2.0000 | ORAL_TABLET | Freq: Four times a day (QID) | ORAL | 0 refills | Status: DC | PRN

## 2024-07-08 MED ORDER — DIPHENOXYLATE-ATROPINE 2.5-0.025 MG PO TABS: 2.0000 | ORAL_TABLET | Freq: Four times a day (QID) | ORAL | 0 refills | Status: AC | PRN

## 2024-07-08 NOTE — Progress Notes (Signed)
 Prescription sent to pharmacy for Lomotil  for diarrhea per patient's request.

## 2024-07-08 NOTE — Progress Notes (Signed)
 [facility] [address]  [place], [place] [address] [phone]   Interval Evaluation  Date of Service: 07/08/24 Patient Name: Nicholas Stevens Patient MRN: [id number] Patient DOB: Jun 12, 1971 Provider: Arthea MARLA Manns, MD  Identifying Statement:  [name] is a 53 y.o. male with right frontal glioblastoma    Oncologic History: Oncology History  Frontal glioblastoma (HCC)  12/18/2023 Surgery   Craniotomy, resection of right frontal mass with Dr. Debby; path is glioblastoma, IDH pending   01/22/2024 - 01/22/2024 Chemotherapy   Patient is on Treatment Plan : BRAIN GLIOBLASTOMA Radiation Therapy With Concurrent Temozolomide  75 mg/m2 Daily Followed By Sequential Maintenance Temozolomide  x 6-12 cycles     06/11/2024 -  Chemotherapy   Patient is on Treatment Plan : BRAIN Temozolomide  D1-5 + Bevacizumab  (10) D1,15 q28d         Interval History: KIMMIE DOREN presents today following recent MRI brain.  This past week he has persistent diarrhea, accompanied by abdominal cramping.  Several other family members have also been ill.  Imodium has been modestly helpful, Lomotil  has also been prescribed. He remains improved with regards to his left sided weakness.  No longer needing to hold on to the wall or furniture in the home to walk.  No further seizures since Keppra  was increased to 1000mg  twice per day.  Decadron  has been discontinued.    Prior- presents for follow up after recent clinical changes.  He describes relatively rapid onset of left sided weakness starting ~10 days ago.  He was unable to walk and use the left arm meaningfully.  No seizure activity appreciated.  Decadron  was started 4mg  twice per day after 3-4 days of the weakness.  Once the steroid was started, he returned back to his prior baseline.  Continues on the Keppra .  Denies severe headaches.   H+P (01/08/24) Patient presented with several days of left sided  clumsiness, impaired coordination.  Was found to have large right frontal mass, c/w primary brain tumor.  He underwent craniotomy, resection with Dr. Debby on 12/18/23; path demonstrated glioblastoma.  Following surgery, he had persistent right sided weakness, he is currently in outpatient PT following discharge from rehab.  No seizures, headaches.     Medications: Current Outpatient Medications on File Prior to Visit  Medication Sig Dispense Refill   apixaban  (ELIQUIS ) 5 MG TABS tablet Take 1 tablet (5 mg total) by mouth 2 (two) times daily. 60 tablet 3   Baclofen  5 MG TABS Take 1 tablet (5 mg total) by mouth every 6 (six) hours as needed (hiccups). 90 tablet 1   camphor-menthol  (SARNA) lotion Apply topically as needed for itching. 222 mL 0   Cyanocobalamin  (B-12 PO) Take 1 tablet by mouth daily.     dexamethasone  (DECADRON ) 1 MG tablet Take 1 tablet (1 mg total) by mouth daily with breakfast. 30 tablet 0   fluconazole  (DIFLUCAN ) 100 MG tablet Take 2 tabs on day#1, then 1 tab daily on Days #2-10 11 tablet 1   furosemide  (LASIX ) 20 MG tablet TAKE 1 TABLET(20 MG) BY MOUTH DAILY 90 tablet 0   latanoprost  (XALATAN ) 0.005 % ophthalmic solution Place 1 drop into both eyes at bedtime. 2.5 mL 2   levETIRAcetam  (KEPPRA ) 500 MG tablet Take 1 tablet (500 mg total) by mouth 2 (two) times daily. (Patient taking differently: Take 1,000 mg by mouth 2 (two) times daily.) 60 tablet 3   magic mouthwash (nystatin , lidocaine , diphenhydrAMINE , alum & mag  hydroxide) suspension Swish and swallow 5 mLs by mouth 4 (four) times daily as needed for mouth pain. 140 mL 1   Multiple Vitamin (MULTIVITAMIN WITH MINERALS) TABS tablet Take 1 tablet by mouth daily.     olmesartan  (BENICAR ) 20 MG tablet Take 1 tablet (20 mg total) by mouth daily. 90 tablet 3   Omega-3 Fatty Acids (FISH OIL) 1000 MG CAPS Take 1,000 mg by mouth daily.     pantoprazole  (PROTONIX ) 40 MG tablet Take 1 tablet (40 mg total) by mouth 2 (two) times daily.  180 tablet 3   pimecrolimus  (ELIDEL ) 1 % cream Apply 1 Application topically 2 (two) times daily as needed (Dermatitis).     Semaglutide -Weight Management (WEGOVY ) 0.25 MG/0.5ML SOAJ Inject 0.25 mg into the skin once a week. 2 mL 2   senna-docusate (SENOKOT-S) 8.6-50 MG tablet Take 2 tablets by mouth daily at 6 (six) AM. 60 tablet 0   temozolomide  (TEMODAR ) 100 MG capsule Take 2 capsules (200 mg total) by mouth daily. (Take with one 180 mg capsule for total daily dose of 380 mg). Take for 5 days on, 23 days off. Repeat every 28 days. May take on an empty stomach to decrease nausea & vomiting. (Patient not taking: Reported on 07/08/2024) 10 capsule 0   temozolomide  (TEMODAR ) 180 MG capsule Take 1 capsule (180 mg total) by mouth daily. (Take with two 100 mg capsules for total daily dose of 380 mg). Take for 5 days on, 23 days off. Repeat every 28 days. May take on an empty stomach to decrease nausea & vomiting. (Patient not taking: Reported on 07/08/2024) 5 capsule 0   triamcinolone  cream (KENALOG ) 0.1 % Apply 1 Application topically daily. Use on affected areas for 2 weeks, then stop for 2 weeks. Repeat until clear if needed. 453 g 0   triamterene -hydrochlorothiazide  (MAXZIDE -25) 37.5-25 MG tablet Take 1 tablet by mouth daily. 90 tablet 3   zolpidem  (AMBIEN ) 10 MG tablet TAKE 1 TABLET BY MOUTH EVERYDAY AT BEDTIME 90 tablet 1   No current facility-administered medications on file prior to visit.    Allergies: No Known Allergies Past Medical History:  Past Medical History:  Diagnosis Date   DDD (degenerative disc disease), lumbar    HNP (herniated nucleus pulposus)    Hypertension    Liver hemangioma    Morbid obesity (HCC)    OSA (obstructive sleep apnea)    no cpap used since weight loss   Overweight(278.02)    Plantar fasciitis of right foot    Sleep apnea    Tinea barbae    Past Surgical History:  Past Surgical History:  Procedure Laterality Date   BREATH TEK H PYLORI N/A 08/20/2013    Procedure: BREATH TEK H PYLORI;  Surgeon: Donnice KATHEE Lunger, MD;  Location: [facility];  Service: General;  Laterality: N/A;   COLONOSCOPY WITH PROPOFOL  N/A 05/05/2022   Procedure: COLONOSCOPY WITH PROPOFOL ;  Surgeon: Albertus Gordy HERO, MD;  Location: [facility];  Service: Gastroenterology;  Laterality: N/A;   CRANIOTOMY Right 12/18/2023   Procedure: Right Frontal Stereotactic Craniotomy for Resection of Tumor;  Surgeon: Debby Dorn MATSU, MD;  Location[facility];  Service: Neurosurgery;  Laterality: Right;   ESOPHAGOGASTRODUODENOSCOPY (EGD) WITH PROPOFOL  N/A 03/15/2023   Procedure: ESOPHAGOGASTRODUODENOSCOPY (EGD) WITH PROPOFOL ;  Surgeon: Avram Lupita BRAVO, MD;  Location: [facility];  Service: Gastroenterology;  Laterality: N/A;   LAPAROSCOPIC APPENDECTOMY  2007   Dr Curvin   LAPAROSCOPIC GASTRIC SLEEVE RESECTION N/A 10/08/2013   Procedure: LAPAROSCOPIC  SLEEVE GASTRECTOMY;  Surgeon: Donnice KATHEE Lunger, MD;  Location: [facility];  Service: General;  Laterality: N/A;   LUMBAR LAMINECTOMY/DECOMPRESSION MICRODISCECTOMY N/A 09/16/2015   Procedure: MICRO LUMBAR DECOMPRESSION, MICRODISCECTOMY L5 - S1;  Surgeon: Reyes Billing, MD;  Location: [facility];  Service: Orthopedics;  Laterality: N/A;   POLYPECTOMY  05/05/2022   Procedure: POLYPECTOMY;  Surgeon: Albertus Gordy HERO, MD;  Location: [facility];  Service: Gastroenterology;;   ROTATOR CUFF REPAIR Right 2005   Dr Harden   Social History:  Social History   Socioeconomic History   Marital status: Married    Spouse name: Tymeir Weathington   Number of children: 3   Years of education: Not on file   Highest education level: 12th grade  Occupational History   Occupation: Doctor, general practice PD    Employer: UNEMPLOYED  Tobacco Use   Smoking status: Never    Passive exposure: Never   Smokeless tobacco: Never  Vaping Use   Vaping status: Never Used  Substance and Sexual Activity   Alcohol  use: No   Drug use: No   Sexual activity: Not on file  Other Topics Concern   Not on file   Social History Narrative   Married 18+ years   3 children - ages approx 71, 9 and 5...daughter age 41 w/ DM type 1   Social Drivers of Corporate investment banker Strain: Low Risk  (04/02/2023)   Overall Financial Resource Strain (CARDIA)    Difficulty of Paying Living Expenses: Not hard at all  Food Insecurity: No Food Insecurity (12/18/2023)   Hunger Vital Sign    Worried About Running Out of Food in the Last Year: Never true    Ran Out of Food in the Last Year: Never true  Transportation Needs: No Transportation Needs (12/18/2023)   PRAPARE - Administrator, Civil Service (Medical): No    Lack of Transportation (Non-Medical): No  Physical Activity: Insufficiently Active (04/02/2023)   Exercise Vital Sign    Days of Exercise per Week: 4 days    Minutes of Exercise per Session: 30 min  Stress: No Stress Concern Present (04/02/2023)   [employer] of Occupational Health - Occupational Stress Questionnaire    Feeling of Stress : Not at all  Social Connections: Socially Integrated (04/02/2023)   Social Connection and Isolation Panel    Frequency of Communication with Friends and Family: More than three times a week    Frequency of Social Gatherings with Friends and Family: Once a week    Attends Religious Services: More than 4 times per year    Active Member of [employer] or Organizations: Yes    Attends Engineer, structural: More than 4 times per year    Marital Status: Married  Catering manager Violence: Not At Risk (12/18/2023)   Humiliation, Afraid, Rape, and Kick questionnaire    Fear of Current or Ex-Partner: No    Emotionally Abused: No    Physically Abused: No    Sexually Abused: No   Family History:  Family History  Problem Relation Age of Onset   Other Mother        hx of DJD   Liver disease Father        ETOH, Hep C   Hypertension Father    Colon cancer Neg Hx    Colon polyps Neg Hx    Esophageal cancer Neg Hx    Rectal cancer Neg Hx    Stomach  cancer Neg Hx  Review of Systems: Constitutional: Doesn't report fevers, chills or abnormal weight loss Eyes: Doesn't report blurriness of vision Ears, nose, mouth, throat, and face: Doesn't report sore throat Respiratory: Doesn't report cough, dyspnea or wheezes Cardiovascular: Doesn't report palpitation, chest discomfort  Gastrointestinal:  Doesn't report nausea, constipation, diarrhea GU: Doesn't report incontinence Skin: Doesn't report skin rashes Neurological: Per HPI Musculoskeletal: Doesn't report joint pain Behavioral/Psych: Doesn't report anxiety  Physical Exam: Vitals:   07/08/24 1415  BP: 118/84  Pulse: 85  Resp: 20  Temp: (!) 97.5 F (36.4 C)  SpO2: 96%    KPS: 70. General: Alert, cooperative, pleasant, in no acute distress Head: Normal EENT: No conjunctival injection or scleral icterus.  Lungs: Resp effort normal Cardiac: Regular rate Abdomen: Non-distended abdomen Skin: No rashes cyanosis or petechiae. Extremities: No clubbing or edema  Neurologic Exam: Mental Status: Awake, alert, attentive to examiner. Oriented to self and environment. Language is fluent with intact comprehension.  Cranial Nerves: Visual acuity is grossly normal. Visual fields are full. Extra-ocular movements intact. No ptosis. Face is symmetric Motor: Tone and bulk are normal. Power is 4+/5 in left arm and 4/5 leg. Reflexes are symmetric, no pathologic reflexes present.  Sensory: Intact to light touch Gait: Hemiparetic   Labs: I have reviewed the data as listed    Component Value Date/Time   NA 139 07/03/2024 0843   K 3.4 (L) 07/03/2024 0843   CL 102 07/03/2024 0843   CO2 29 07/03/2024 0843   GLUCOSE 100 (H) 07/03/2024 0843   BUN 9 07/03/2024 0843   CREATININE 1.16 07/03/2024 0843   CALCIUM 9.1 07/03/2024 0843   PROT 6.7 07/03/2024 0843   ALBUMIN 3.8 07/03/2024 0843   AST 22 07/03/2024 0843   ALT 59 (H) 07/03/2024 0843   ALKPHOS 36 (L) 07/03/2024 0843   BILITOT 1.5 (H)  07/03/2024 0843   GFRNONAA >60 07/03/2024 0843   GFRAA >60 09/15/2015 1520   Lab Results  Component Value Date   WBC 3.5 (L) 07/08/2024   NEUTROABS 1.9 07/08/2024   HGB 15.5 07/08/2024   HCT 46.4 07/08/2024   MCV 87.4 07/08/2024   PLT 230 07/08/2024    Imaging:  [facility] Clinician Interpretation: I have personally reviewed the CNS images as listed.  My interpretation, in the context of the patient's clinical presentation, is stable disease  MR BRAIN W WO CONTRAST Result Date: 07/02/2024 CLINICAL DATA:  Brain/CNS neoplasm, assess treatment response EXAM: MRI HEAD WITHOUT AND WITH CONTRAST TECHNIQUE: Multiplanar, multiecho pulse sequences of the brain and surrounding structures were obtained without and with intravenous contrast. CONTRAST:  10mL GADAVIST  GADOBUTROL  1 MMOL/ML IV SOLN COMPARISON:  MRI of the head dated June 03, 2024. FINDINGS: Brain: The patient is again noted to be status post right frontal parietal craniotomy for resection of lesion within the right posteromedial frontal lobe. There has been significant interval decrease in size of the surgical resection cavity from approximately 2.7 x 4.8 x 4.8 cm to approximately 1.9 x 3.0 x 3.4 cm. There is also significantly less peripheral enhancement now present. There is markedly less surrounding cerebral edema and mass effect. There is no longer shift of the midline structures to the left. There is residual increased T2 signal present throughout the white matter of the right frontal lobe. There is residual hemosiderin staining around the margins of the resection cavity. Vascular: Normal vascular flow voids. Skull and upper cervical spine: Status post right frontal parietal craniotomy. Normal bone marrow signal. No osseous lesions. Sinuses/Orbits: Clear paranasal  sinuses.  Normal orbits. Other: None. IMPRESSION: 1. There has been significant interval improvement of the surgical resection cavity in the right posterior frontal lobe. The cavity has  decreased in size and demonstrates markedly less peripheral enhancement. There has also been interval marked improvement of extensive cerebral edema. The findings are compatible with a positive response to therapy. Electronically Signed   By: Evalene Coho M.D.   On: 07/02/2024 13:50   NM PET Metabolic Brain Result Date: 06/17/2024 CLINICAL DATA:  Dementia. Frontotemporal dementia versus Alzheimer's type dementia EXAM: NM PET METABOLIC BRAIN TECHNIQUE: 10.0 mCi F-18 FDG was injected intravenously. Full-ring PET imaging was performed from the vertex to skull base. CT data was obtained and used for attenuation correction and anatomic localization. FASTING BLOOD GLUCOSE:  Value: 98 mg/dl COMPARISON:  Brain MRI 06/04/1999 FINDINGS: The large lesion in the superior RIGHT frontal lobe which demonstrates avid peripheral enhancement on contrast MRI does not have significant metabolic activity associated with the lesion. There is minimal mild central activity within the lesion that is less than normal cortical activity. There is generalized cortical hypodensity within the more anterolateral frontal lobe related to treatment effects and or edema. IMPRESSION: Minimal metabolic activity associated with the enhancing lesion in the high anterior RIGHT frontal lobe. Findings are more suggestive post treatment change/tumor necrosis rather than tumor recurrence. Of note saved fused PET/MRI images are appended on the MRI exam from 06/03/2024. Electronically Signed   By: Jackquline Boxer M.D.   On: 06/17/2024 16:22   Assessment/Plan Frontal glioblastoma (HCC)  Seizure disorder (HCC)  Nicholas W Stevens is clinically stable today, following cycle #3 Temodar  and avastin .  MRI brain demonstrates stable findings, encouraging response to treatment thus far.  Lower GI symptoms are suspected secondary to gastroenteritis, based on close contacts symptoms.  He has improved today compared to earlier in the week.    Recommended  deferring cycle #4 Temozolomide  and Avastin  an additional week based on clinical infection symptoms.  He is agreeable with this.    We recommended continuing treatment with cycle #3 Temozolomide , 150 mg/m2, on for five days and off for twenty three days in twenty eight day cycles. The patient will have a complete blood count performed on days 21 and 28 of each cycle, and a comprehensive metabolic panel performed on day 28 of each cycle. Labs may need to be performed more often. Zofran  will prescribed for home use for nausea/vomiting.   Informed consent was obtained verbally at bedside to proceed with oral chemotherapy.  Chemotherapy should be held for the following:  ANC less than 1,000  Platelets less than 100,000  LFT or creatinine greater than 2x ULN  If clinical concerns/contraindications develop  Decadron  should remain off if tolerated.  Will con't Eliquis  5mg  BID for DVT.  We ask that SHADD DUNSTAN return to clinic in 1 weeks with labs prior to cycle #4, day 1/28, or sooner as needed.  All questions were answered. The patient knows to call the clinic with any problems, questions or concerns. No barriers to learning were detected.  The total time spent in the encounter was 40 minutes and more than 50% was on counseling and review of test results   Arthea MARLA Manns, MD Medical Director of Neuro-Oncology [facility] [place] 07/08/24 2:27 PM

## 2024-07-10 ENCOUNTER — Other Ambulatory Visit: Payer: Self-pay

## 2024-07-11 ENCOUNTER — Other Ambulatory Visit: Payer: Self-pay

## 2024-07-15 ENCOUNTER — Inpatient Hospital Stay: Admitting: Licensed Clinical Social Worker

## 2024-07-15 ENCOUNTER — Inpatient Hospital Stay (HOSPITAL_BASED_OUTPATIENT_CLINIC_OR_DEPARTMENT_OTHER): Admitting: Internal Medicine

## 2024-07-15 ENCOUNTER — Inpatient Hospital Stay

## 2024-07-15 ENCOUNTER — Other Ambulatory Visit: Payer: Self-pay | Admitting: *Deleted

## 2024-07-15 VITALS — BP 131/96 | HR 80 | Temp 97.8°F | Resp 20 | Wt 297.5 lb

## 2024-07-15 DIAGNOSIS — C711 Malignant neoplasm of frontal lobe: Secondary | ICD-10-CM

## 2024-07-15 DIAGNOSIS — Z5112 Encounter for antineoplastic immunotherapy: Secondary | ICD-10-CM | POA: Diagnosis not present

## 2024-07-15 LAB — CBC WITH DIFFERENTIAL (CANCER CENTER ONLY)
Abs Immature Granulocytes: 0.03 K/uL (ref 0.00–0.07)
Basophils Absolute: 0 K/uL (ref 0.0–0.1)
Basophils Relative: 0 %
Eosinophils Absolute: 0 K/uL (ref 0.0–0.5)
Eosinophils Relative: 1 %
HCT: 45 % (ref 39.0–52.0)
Hemoglobin: 15.6 g/dL (ref 13.0–17.0)
Immature Granulocytes: 1 %
Lymphocytes Relative: 25 %
Lymphs Abs: 1 K/uL (ref 0.7–4.0)
MCH: 29.2 pg (ref 26.0–34.0)
MCHC: 34.7 g/dL (ref 30.0–36.0)
MCV: 84.1 fL (ref 80.0–100.0)
Monocytes Absolute: 0.4 K/uL (ref 0.1–1.0)
Monocytes Relative: 9 %
Neutro Abs: 2.5 K/uL (ref 1.7–7.7)
Neutrophils Relative %: 64 %
Platelet Count: 285 K/uL (ref 150–400)
RBC: 5.35 MIL/uL (ref 4.22–5.81)
RDW: 15.9 % — ABNORMAL HIGH (ref 11.5–15.5)
WBC Count: 3.8 K/uL — ABNORMAL LOW (ref 4.0–10.5)
nRBC: 0 % (ref 0.0–0.2)

## 2024-07-15 LAB — CMP (CANCER CENTER ONLY)
ALT: 55 U/L — ABNORMAL HIGH (ref 0–44)
AST: 30 U/L (ref 15–41)
Albumin: 4 g/dL (ref 3.5–5.0)
Alkaline Phosphatase: 38 U/L (ref 38–126)
Anion gap: 7 (ref 5–15)
BUN: 10 mg/dL (ref 6–20)
CO2: 26 mmol/L (ref 22–32)
Calcium: 9.6 mg/dL (ref 8.9–10.3)
Chloride: 104 mmol/L (ref 98–111)
Creatinine: 1.05 mg/dL (ref 0.61–1.24)
GFR, Estimated: >60 mL/min (ref >60)
Glucose, Bld: 115 mg/dL — ABNORMAL HIGH (ref 70–99)
Potassium: 3.7 mmol/L (ref 3.5–5.1)
Sodium: 137 mmol/L (ref 135–145)
Total Bilirubin: 0.6 mg/dL (ref 0.0–1.2)
Total Protein: 7.3 g/dL (ref 6.5–8.1)

## 2024-07-15 MED ORDER — SODIUM CHLORIDE 0.9% FLUSH: 10.0000 mL | INTRAVENOUS | Status: DC | PRN

## 2024-07-15 MED ORDER — SODIUM CHLORIDE 0.9 % IV SOLN: INTRAVENOUS | Status: DC

## 2024-07-15 MED ORDER — TEMOZOLOMIDE 140 MG PO CAPS: 280.0000 mg | ORAL_CAPSULE | Freq: Every day | ORAL | 0 refills | Status: DC

## 2024-07-15 MED ORDER — HEPARIN SOD (PORK) LOCK FLUSH 100 UNIT/ML IV SOLN: 500.0000 [IU] | Freq: Once | INTRAVENOUS | Status: DC | PRN

## 2024-07-15 MED ORDER — TEMOZOLOMIDE 100 MG PO CAPS: 100.0000 mg | ORAL_CAPSULE | Freq: Every day | ORAL | 0 refills | Status: DC

## 2024-07-15 MED ORDER — SODIUM CHLORIDE 0.9 % IV SOLN
10.0000 mg/kg | Freq: Once | INTRAVENOUS | Status: AC
  Administered 2024-07-15: 1400 mg via INTRAVENOUS
  Filled 2024-07-15: qty 8

## 2024-07-15 NOTE — Patient Instructions (Signed)
 [facility] - A DEPT OF [facility]  Discharge Instructions: Thank you for choosing [facility] to provide your oncology and hematology care.   If you have a lab appointment with the Cancer Center, please go directly to the Cancer Center and check in at the registration area.   Wear comfortable clothing and clothing appropriate for easy access to any Portacath or PICC line.   We strive to give you quality time with your provider. You may need to reschedule your appointment if you arrive late (15 or more minutes).  Arriving late affects you and other patients whose appointments are after yours.  Also, if you miss three or more appointments without notifying the office, you may be dismissed from the clinic at the provider's discretion.      For prescription refill requests, have your pharmacy contact our office and allow 72 hours for refills to be completed.    Today you received the following chemotherapy and/or immunotherapy agents Mvasi      To help prevent nausea and vomiting after your treatment, we encourage you to take your nausea medication as directed.  BELOW ARE SYMPTOMS THAT SHOULD BE REPORTED IMMEDIATELY: *FEVER GREATER THAN 100.4 F (38 C) OR HIGHER *CHILLS OR SWEATING *NAUSEA AND VOMITING THAT IS NOT CONTROLLED WITH YOUR NAUSEA MEDICATION *UNUSUAL SHORTNESS OF BREATH *UNUSUAL BRUISING OR BLEEDING *URINARY PROBLEMS (pain or burning when urinating, or frequent urination) *BOWEL PROBLEMS (unusual diarrhea, constipation, pain near the anus) TENDERNESS IN MOUTH AND THROAT WITH OR WITHOUT PRESENCE OF ULCERS (sore throat, sores in mouth, or a toothache) UNUSUAL RASH, SWELLING OR PAIN  UNUSUAL VAGINAL DISCHARGE OR ITCHING   Items with * indicate a potential emergency and should be followed up as soon as possible or go to the Emergency Department if any problems should occur.  Please show the CHEMOTHERAPY ALERT CARD or IMMUNOTHERAPY ALERT  CARD at check-in to the Emergency Department and triage nurse.  Should you have questions after your visit or need to cancel or reschedule your appointment, please contact [facility] - A DEPT OF [facility]  Dept: [phone]  and follow the prompts.  Office hours are 8:00 a.m. to 4:30 p.m. Monday - Friday. Please note that voicemails left after 4:00 p.m. may not be returned until the following business day.  We are closed weekends and major holidays. You have access to a nurse at all times for urgent questions. Please call the main number to the clinic Dept: [phone] and follow the prompts.   For any non-urgent questions, you may also contact your provider using MyChart. We now offer e-Visits for anyone 38 and older to request care online for non-urgent symptoms. For details visit [email or web address].   Also download the MyChart app! Go to the app store, search "MyChart", open the app, select [facility], and log in with your MyChart username and password.

## 2024-07-15 NOTE — Progress Notes (Signed)
 [facility] at [facility] [address]  [place], [place] [address] [phone]   Interval Evaluation  Date of Service: 07/15/24 Patient Name: Nicholas Stevens Patient MRN: [id number] Patient DOB: 09-Jan-1971 Provider: Arthea MARLA Manns, MD  Identifying Statement:  Nicholas Stevens is a 53 y.o. male with right frontal glioblastoma    Oncologic History: Oncology History  Frontal glioblastoma (HCC)  12/18/2023 Surgery   Craniotomy, resection of right frontal mass with Dr. Debby; path is glioblastoma, IDH pending   01/22/2024 - 01/22/2024 Chemotherapy   Patient is on Treatment Plan : BRAIN GLIOBLASTOMA Radiation Therapy With Concurrent Temozolomide  75 mg/m2 Daily Followed By Sequential Maintenance Temozolomide  x 6-12 cycles     06/11/2024 -  Chemotherapy   Patient is on Treatment Plan : BRAIN Temozolomide  D1-5 + Bevacizumab  (10) D1,15 q28d         Interval History: Nicholas Stevens presents today after completing cycle #3 5-day TMZ.  GI symptoms have improved.  He remains off decadron .  No new or progressive changes.  Denies headaches or seizures.  Prior- presents for follow up after recent clinical changes.  He describes relatively rapid onset of left sided weakness starting ~10 days ago.  He was unable to walk and use the left arm meaningfully.  No seizure activity appreciated.  Decadron  was started 4mg  twice per day after 3-4 days of the weakness.  Once the steroid was started, he returned back to his prior baseline.  Continues on the Keppra .  Denies severe headaches.   H+P (01/08/24) Patient presented with several days of left sided clumsiness, impaired coordination.  Was found to have large right frontal mass, c/w primary brain tumor.  He underwent craniotomy, resection with Dr. Debby on 12/18/23; path demonstrated glioblastoma.  Following surgery, he had persistent right sided weakness, he is currently in outpatient PT following discharge from  rehab.  No seizures, headaches.     Medications: Current Outpatient Medications on File Prior to Visit  Medication Sig Dispense Refill   apixaban  (ELIQUIS ) 5 MG TABS tablet Take 1 tablet (5 mg total) by mouth 2 (two) times daily. 60 tablet 3   Baclofen  5 MG TABS Take 1 tablet (5 mg total) by mouth every 6 (six) hours as needed (hiccups). 90 tablet 1   camphor-menthol  (SARNA) lotion Apply topically as needed for itching. 222 mL 0   Cyanocobalamin  (B-12 PO) Take 1 tablet by mouth daily.     diphenoxylate -atropine  (LOMOTIL ) 2.5-0.025 MG tablet Take 2 tablets by mouth 4 (four) times daily as needed for up to 10 days for diarrhea or loose stools. Can reduce to 1 tablet by mouth 4 times daily as needed once diarrhea is controlled. 80 tablet 0   latanoprost  (XALATAN ) 0.005 % ophthalmic solution Place 1 drop into both eyes at bedtime. 2.5 mL 2   levETIRAcetam  (KEPPRA ) 500 MG tablet Take 1 tablet (500 mg total) by mouth 2 (two) times daily. (Patient taking differently: Take 1,000 mg by mouth 2 (two) times daily.) 60 tablet 3   magic mouthwash (nystatin , lidocaine , diphenhydrAMINE , alum & mag hydroxide) suspension Swish and swallow 5 mLs by mouth 4 (four) times daily as needed for mouth pain. 140 mL 1   Multiple Vitamin (MULTIVITAMIN WITH MINERALS) TABS tablet Take 1 tablet by mouth daily.     olmesartan  (BENICAR ) 20 MG tablet Take 1 tablet (20 mg total) by mouth daily. 90 tablet 3   Omega-3 Fatty Acids (FISH OIL) 1000 MG CAPS Take 1,000  mg by mouth daily.     pantoprazole  (PROTONIX ) 40 MG tablet Take 1 tablet (40 mg total) by mouth 2 (two) times daily. 180 tablet 3   pimecrolimus  (ELIDEL ) 1 % cream Apply 1 Application topically 2 (two) times daily as needed (Dermatitis).     Semaglutide -Weight Management (WEGOVY ) 0.25 MG/0.5ML SOAJ Inject 0.25 mg into the skin once a week. 2 mL 2   senna-docusate (SENOKOT-S) 8.6-50 MG tablet Take 2 tablets by mouth daily at 6 (six) AM. 60 tablet 0   temozolomide  (TEMODAR )  100 MG capsule Take 2 capsules (200 mg total) by mouth daily. (Take with one 180 mg capsule for total daily dose of 380 mg). Take for 5 days on, 23 days off. Repeat every 28 days. May take on an empty stomach to decrease nausea & vomiting. 10 capsule 0   temozolomide  (TEMODAR ) 180 MG capsule Take 1 capsule (180 mg total) by mouth daily. (Take with two 100 mg capsules for total daily dose of 380 mg). Take for 5 days on, 23 days off. Repeat every 28 days. May take on an empty stomach to decrease nausea & vomiting. 5 capsule 0   triamcinolone  cream (KENALOG ) 0.1 % Apply 1 Application topically daily. Use on affected areas for 2 weeks, then stop for 2 weeks. Repeat until clear if needed. 453 g 0   zolpidem  (AMBIEN ) 10 MG tablet TAKE 1 TABLET BY MOUTH EVERYDAY AT BEDTIME 90 tablet 1   dexamethasone  (DECADRON ) 1 MG tablet Take 1 tablet (1 mg total) by mouth daily with breakfast. 30 tablet 0   fluconazole  (DIFLUCAN ) 100 MG tablet Take 2 tabs on day#1, then 1 tab daily on Days #2-10 11 tablet 1   furosemide  (LASIX ) 20 MG tablet TAKE 1 TABLET(20 MG) BY MOUTH DAILY 90 tablet 0   triamterene -hydrochlorothiazide  (MAXZIDE -25) 37.5-25 MG tablet Take 1 tablet by mouth daily. 90 tablet 3   No current facility-administered medications on file prior to visit.    Allergies: No Known Allergies Past Medical History:  Past Medical History:  Diagnosis Date   DDD (degenerative disc disease), lumbar    HNP (herniated nucleus pulposus)    Hypertension    Liver hemangioma    Morbid obesity (HCC)    OSA (obstructive sleep apnea)    no cpap used since weight loss   Overweight(278.02)    Plantar fasciitis of right foot    Sleep apnea    Tinea barbae    Past Surgical History:  Past Surgical History:  Procedure Laterality Date   BREATH TEK H PYLORI N/A 08/20/2013   Procedure: BREATH TEK H PYLORI;  Surgeon: Donnice KATHEE Lunger, MD;  Location: [facility];  Service: General;  Laterality: N/A;   COLONOSCOPY WITH PROPOFOL   N/A 05/05/2022   Procedure: COLONOSCOPY WITH PROPOFOL ;  Surgeon: Albertus Gordy HERO, MD;  Location: [facility];  Service: Gastroenterology;  Laterality: N/A;   CRANIOTOMY Right 12/18/2023   Procedure: Right Frontal Stereotactic Craniotomy for Resection of Tumor;  Surgeon: Debby Dorn MATSU, MD;  Location: [facility] OR;  Service: Neurosurgery;  Laterality: Right;   ESOPHAGOGASTRODUODENOSCOPY (EGD) WITH PROPOFOL  N/A 03/15/2023   Procedure: ESOPHAGOGASTRODUODENOSCOPY (EGD) WITH PROPOFOL ;  Surgeon: Avram Lupita BRAVO, MD;  Location: [facility];  Service: Gastroenterology;  Laterality: N/A;   LAPAROSCOPIC APPENDECTOMY  2007   Dr Curvin   LAPAROSCOPIC GASTRIC SLEEVE RESECTION N/A 10/08/2013   Procedure: LAPAROSCOPIC SLEEVE GASTRECTOMY;  Surgeon: Donnice KATHEE Lunger, MD;  Location: [facility];  Service: General;  Laterality: N/A;   LUMBAR  LAMINECTOMY/DECOMPRESSION MICRODISCECTOMY N/A 09/16/2015   Procedure: MICRO LUMBAR DECOMPRESSION, MICRODISCECTOMY L5 - S1;  Surgeon: Reyes Billing, MD;  Location: [facility];  Service: Orthopedics;  Laterality: N/A;   POLYPECTOMY  05/05/2022   Procedure: POLYPECTOMY;  Surgeon: Albertus Gordy HERO, MD;  Location: [facility];  Service: Gastroenterology;;   ROTATOR CUFF REPAIR Right 2005   Dr Harden   Social History:  Social History   Socioeconomic History   Marital status: Married    Spouse name: Zaide Kardell   Number of children: 3   Years of education: Not on file   Highest education level: 12th grade  Occupational History   Occupation: Doctor, general practice PD    Employer: UNEMPLOYED  Tobacco Use   Smoking status: Never    Passive exposure: Never   Smokeless tobacco: Never  Vaping Use   Vaping status: Never Used  Substance and Sexual Activity   Alcohol  use: No   Drug use: No   Sexual activity: Not on file  Other Topics Concern   Not on file  Social History Narrative   Married 18+ years   3 children - ages approx 38, 9 and 5...daughter age 96 w/ DM type 1   Social Drivers of Manufacturing engineer Strain: Low Risk  (04/02/2023)   Overall Financial Resource Strain (CARDIA)    Difficulty of Paying Living Expenses: Not hard at all  Food Insecurity: No Food Insecurity (12/18/2023)   Hunger Vital Sign    Worried About Running Out of Food in the Last Year: Never true    Ran Out of Food in the Last Year: Never true  Transportation Needs: No Transportation Needs (12/18/2023)   PRAPARE - Administrator, Civil Service (Medical): No    Lack of Transportation (Non-Medical): No  Physical Activity: Insufficiently Active (04/02/2023)   Exercise Vital Sign    Days of Exercise per Week: 4 days    Minutes of Exercise per Session: 30 min  Stress: No Stress Concern Present (04/02/2023)   [employer] of Occupational Health - Occupational Stress Questionnaire    Feeling of Stress : Not at all  Social Connections: Socially Integrated (04/02/2023)   Social Connection and Isolation Panel    Frequency of Communication with Friends and Family: More than three times a week    Frequency of Social Gatherings with Friends and Family: Once a week    Attends Religious Services: More than 4 times per year    Active Member of [employer] or Organizations: Yes    Attends Engineer, structural: More than 4 times per year    Marital Status: Married  Catering manager Violence: Not At Risk (12/18/2023)   Humiliation, Afraid, Rape, and Kick questionnaire    Fear of Current or Ex-Partner: No    Emotionally Abused: No    Physically Abused: No    Sexually Abused: No   Family History:  Family History  Problem Relation Age of Onset   Other Mother        hx of DJD   Liver disease Father        ETOH, Hep C   Hypertension Father    Colon cancer Neg Hx    Colon polyps Neg Hx    Esophageal cancer Neg Hx    Rectal cancer Neg Hx    Stomach cancer Neg Hx     Review of Systems: Constitutional: Doesn't report fevers, chills or abnormal weight loss Eyes: Doesn't report blurriness of  vision Ears, nose,  mouth, throat, and face: Doesn't report sore throat Respiratory: Doesn't report cough, dyspnea or wheezes Cardiovascular: Doesn't report palpitation, chest discomfort  Gastrointestinal:  Doesn't report nausea, constipation, diarrhea GU: Doesn't report incontinence Skin: Doesn't report skin rashes Neurological: Per HPI Musculoskeletal: Doesn't report joint pain Behavioral/Psych: Doesn't report anxiety  Physical Exam: Vitals:   07/15/24 1223  BP: (!) 131/96  Pulse: 80  Resp: 20  Temp: 97.8 F (36.6 C)  SpO2: 96%    KPS: 70. General: Alert, cooperative, pleasant, in no acute distress Head: Normal EENT: No conjunctival injection or scleral icterus.  Lungs: Resp effort normal Cardiac: Regular rate Abdomen: Non-distended abdomen Skin: No rashes cyanosis or petechiae. Extremities: No clubbing or edema  Neurologic Exam: Mental Status: Awake, alert, attentive to examiner. Oriented to self and environment. Language is fluent with intact comprehension.  Cranial Nerves: Visual acuity is grossly normal. Visual fields are full. Extra-ocular movements intact. No ptosis. Face is symmetric Motor: Tone and bulk are normal. Power is 4+/5 in left arm and 4/5 leg. Reflexes are symmetric, no pathologic reflexes present.  Sensory: Intact to light touch Gait: Hemiparetic   Labs: I have reviewed the data as listed    Component Value Date/Time   NA 137 07/15/2024 1140   K 3.7 07/15/2024 1140   CL 104 07/15/2024 1140   CO2 26 07/15/2024 1140   GLUCOSE 115 (H) 07/15/2024 1140   BUN 10 07/15/2024 1140   CREATININE 1.05 07/15/2024 1140   CALCIUM 9.6 07/15/2024 1140   PROT 7.3 07/15/2024 1140   ALBUMIN 4.0 07/15/2024 1140   AST 30 07/15/2024 1140   ALT 55 (H) 07/15/2024 1140   ALKPHOS 38 07/15/2024 1140   BILITOT 0.6 07/15/2024 1140   GFRNONAA >60 07/15/2024 1140   GFRAA >60 09/15/2015 1520   Lab Results  Component Value Date   WBC 3.8 (L) 07/15/2024   NEUTROABS  2.5 07/15/2024   HGB 15.6 07/15/2024   HCT 45.0 07/15/2024   MCV 84.1 07/15/2024   PLT 285 07/15/2024    Imaging:  [facility] Clinician Interpretation: I have personally reviewed the CNS images as listed.  My interpretation, in the context of the patient's clinical presentation, is stable disease  MR BRAIN W WO CONTRAST Result Date: 07/02/2024 CLINICAL DATA:  Brain/CNS neoplasm, assess treatment response EXAM: MRI HEAD WITHOUT AND WITH CONTRAST TECHNIQUE: Multiplanar, multiecho pulse sequences of the brain and surrounding structures were obtained without and with intravenous contrast. CONTRAST:  10mL GADAVIST  GADOBUTROL  1 MMOL/ML IV SOLN COMPARISON:  MRI of the head dated June 03, 2024. FINDINGS: Brain: The patient is again noted to be status post right frontal parietal craniotomy for resection of lesion within the right posteromedial frontal lobe. There has been significant interval decrease in size of the surgical resection cavity from approximately 2.7 x 4.8 x 4.8 cm to approximately 1.9 x 3.0 x 3.4 cm. There is also significantly less peripheral enhancement now present. There is markedly less surrounding cerebral edema and mass effect. There is no longer shift of the midline structures to the left. There is residual increased T2 signal present throughout the white matter of the right frontal lobe. There is residual hemosiderin staining around the margins of the resection cavity. Vascular: Normal vascular flow voids. Skull and upper cervical spine: Status post right frontal parietal craniotomy. Normal bone marrow signal. No osseous lesions. Sinuses/Orbits: Clear paranasal sinuses.  Normal orbits. Other: None. IMPRESSION: 1. There has been significant interval improvement of the surgical resection cavity in the right posterior  frontal lobe. The cavity has decreased in size and demonstrates markedly less peripheral enhancement. There has also been interval marked improvement of extensive cerebral edema. The  findings are compatible with a positive response to therapy. Electronically Signed   By: Evalene Coho M.D.   On: 07/02/2024 13:50   NM PET Metabolic Brain Result Date: 06/17/2024 CLINICAL DATA:  Dementia. Frontotemporal dementia versus Alzheimer's type dementia EXAM: NM PET METABOLIC BRAIN TECHNIQUE: 10.0 mCi F-18 FDG was injected intravenously. Full-ring PET imaging was performed from the vertex to skull base. CT data was obtained and used for attenuation correction and anatomic localization. FASTING BLOOD GLUCOSE:  Value: 98 mg/dl COMPARISON:  Brain MRI 06/04/1999 FINDINGS: The large lesion in the superior RIGHT frontal lobe which demonstrates avid peripheral enhancement on contrast MRI does not have significant metabolic activity associated with the lesion. There is minimal mild central activity within the lesion that is less than normal cortical activity. There is generalized cortical hypodensity within the more anterolateral frontal lobe related to treatment effects and or edema. IMPRESSION: Minimal metabolic activity associated with the enhancing lesion in the high anterior RIGHT frontal lobe. Findings are more suggestive post treatment change/tumor necrosis rather than tumor recurrence. Of note saved fused PET/MRI images are appended on the MRI exam from 06/03/2024. Electronically Signed   By: Jackquline Boxer M.D.   On: 06/17/2024 16:22   Assessment/Plan Frontal glioblastoma (HCC) - Plan: temozolomide  (TEMODAR ) 140 MG capsule, temozolomide  (TEMODAR ) 100 MG capsule, Infusion Appointment Request, Clinic Appointment Request, Lab Appointment Request, Total Protein, Urine dipstick, Infusion Appointment Request, Clinic Appointment Request, Lab Appointment Request, CBC with Differential (Cancer Center Only)  Nicholas Stevens is clinically stable today, following cycle #3 Temodar  and avastin .  MRI brain demonstrates stable findings, encouraging response to treatment thus far.  GI symptoms have  resolved, likely secondary to viral enteritis.  We recommended continuing treatment with cycle #3 Temozolomide , 150 mg/m2, on for five days and off for twenty three days in twenty eight day cycles. The patient will have a complete blood count performed on days 21 and 28 of each cycle, and a comprehensive metabolic panel performed on day 28 of each cycle. Labs may need to be performed more often. Zofran  will prescribed for home use for nausea/vomiting.   Informed consent was obtained verbally at bedside to proceed with oral chemotherapy.  Chemotherapy should be held for the following:  ANC less than 1,000  Platelets less than 100,000  LFT or creatinine greater than 2x ULN  If clinical concerns/contraindications develop  Decadron  should remain off if tolerated.  Will con't Eliquis  5mg  BID for DVT.  We ask that Nicholas Stevens return to clinic in 2 weeks with labs prior to cycle #4, day 15/28, or sooner as needed.  All questions were answered. The patient knows to call the clinic with any problems, questions or concerns. No barriers to learning were detected.  The total time spent in the encounter was 30 minutes and more than 50% was on counseling and review of test results   Arthea MARLA Manns, MD Medical Director of Neuro-Oncology [facility] at [facility] 07/15/24 12:56 PM

## 2024-07-15 NOTE — Progress Notes (Signed)
 Per Vaslow MD, ok to treat today without urine protein. LAB to obtain one before next treatment.

## 2024-07-16 ENCOUNTER — Encounter: Payer: Self-pay | Admitting: Internal Medicine

## 2024-07-16 NOTE — Progress Notes (Signed)
 [facility] CSW Progress Note  Visual merchandiser met with patient to follow-up on scheduling assessments.    Interventions: CSW spoke with pt and his wife to check in.  Pt has not been feeling well for a couple of weeks, but reports today he is feeling stronger than he has been.  Pt having treatment today.  CSW to contact pt next week to check in, if pt is feeling up to it assessments will be scheduled at that time.        Follow Up Plan:  CSW will follow-up with patient by phone     Devere JONELLE Manna, LCSW Clinical Social Worker [facility]

## 2024-07-17 ENCOUNTER — Other Ambulatory Visit: Payer: Self-pay

## 2024-07-17 ENCOUNTER — Encounter: Payer: Self-pay | Admitting: Internal Medicine

## 2024-07-18 ENCOUNTER — Encounter: Payer: Self-pay | Admitting: Internal Medicine

## 2024-07-18 ENCOUNTER — Other Ambulatory Visit: Payer: Self-pay | Admitting: Internal Medicine

## 2024-07-18 MED ORDER — LEVETIRACETAM 500 MG PO TABS: 1000.0000 mg | ORAL_TABLET | Freq: Two times a day (BID) | ORAL | 2 refills | Status: DC

## 2024-07-22 ENCOUNTER — Ambulatory Visit

## 2024-07-22 ENCOUNTER — Ambulatory Visit: Admitting: Internal Medicine

## 2024-07-22 ENCOUNTER — Other Ambulatory Visit

## 2024-07-28 ENCOUNTER — Encounter: Payer: Self-pay | Admitting: Internal Medicine

## 2024-07-29 ENCOUNTER — Inpatient Hospital Stay: Admitting: Licensed Clinical Social Worker

## 2024-07-29 ENCOUNTER — Inpatient Hospital Stay

## 2024-07-29 ENCOUNTER — Inpatient Hospital Stay (HOSPITAL_BASED_OUTPATIENT_CLINIC_OR_DEPARTMENT_OTHER): Admitting: Internal Medicine

## 2024-07-29 ENCOUNTER — Inpatient Hospital Stay: Attending: Radiation Oncology

## 2024-07-29 ENCOUNTER — Encounter: Payer: Self-pay | Admitting: Internal Medicine

## 2024-07-29 VITALS — BP 111/99 | HR 81 | Temp 97.0°F | Resp 20 | Wt 292.9 lb

## 2024-07-29 DIAGNOSIS — Z5112 Encounter for antineoplastic immunotherapy: Secondary | ICD-10-CM | POA: Insufficient documentation

## 2024-07-29 DIAGNOSIS — Z79630 Long term (current) use of alkylating agent: Secondary | ICD-10-CM | POA: Insufficient documentation

## 2024-07-29 DIAGNOSIS — C711 Malignant neoplasm of frontal lobe: Secondary | ICD-10-CM | POA: Insufficient documentation

## 2024-07-29 DIAGNOSIS — G40909 Epilepsy, unspecified, not intractable, without status epilepticus: Secondary | ICD-10-CM

## 2024-07-29 LAB — CBC WITH DIFFERENTIAL (CANCER CENTER ONLY)
Abs Immature Granulocytes: 0.01 K/uL (ref 0.00–0.07)
Basophils Absolute: 0 K/uL (ref 0.0–0.1)
Basophils Relative: 1 %
Eosinophils Absolute: 0.1 K/uL (ref 0.0–0.5)
Eosinophils Relative: 3 %
HCT: 44.3 % (ref 39.0–52.0)
Hemoglobin: 15.1 g/dL (ref 13.0–17.0)
Immature Granulocytes: 0 %
Lymphocytes Relative: 31 %
Lymphs Abs: 1.1 K/uL (ref 0.7–4.0)
MCH: 29.3 pg (ref 26.0–34.0)
MCHC: 34.1 g/dL (ref 30.0–36.0)
MCV: 86 fL (ref 80.0–100.0)
Monocytes Absolute: 0.4 K/uL (ref 0.1–1.0)
Monocytes Relative: 12 %
Neutro Abs: 1.9 K/uL (ref 1.7–7.7)
Neutrophils Relative %: 53 %
Platelet Count: 205 K/uL (ref 150–400)
RBC: 5.15 MIL/uL (ref 4.22–5.81)
RDW: 14.7 % (ref 11.5–15.5)
WBC Count: 3.5 K/uL — ABNORMAL LOW (ref 4.0–10.5)
nRBC: 0 % (ref 0.0–0.2)

## 2024-07-29 LAB — TOTAL PROTEIN, URINE DIPSTICK: Protein, ur: NEGATIVE mg/dL

## 2024-07-29 MED ORDER — SODIUM CHLORIDE 0.9 % IV SOLN
7.5000 mg/kg | Freq: Once | INTRAVENOUS | Status: AC
  Administered 2024-07-29: 1000 mg via INTRAVENOUS
  Filled 2024-07-29: qty 32

## 2024-07-29 MED ORDER — SODIUM CHLORIDE 0.9 % IV SOLN: INTRAVENOUS | Status: DC

## 2024-07-29 MED ORDER — NYSTATIN 100000 UNIT/ML MT SUSP: 5.0000 mL | Freq: Four times a day (QID) | OROMUCOSAL | 1 refills | Status: AC | PRN

## 2024-07-29 NOTE — Progress Notes (Signed)
 [facility] [address]  [place], [place] [address] [phone]   Interval Evaluation  Date of Service: 07/29/24 Patient Name: Nicholas Stevens Patient MRN: [id number] Patient DOB: 10-Apr-1971 Provider: Arthea MARLA Manns, MD  Identifying Statement:  Nicholas Stevens is a 53 y.o. male with right frontal glioblastoma    Oncologic History: Oncology History  Frontal glioblastoma (HCC)  12/18/2023 Surgery   Craniotomy, resection of right frontal mass with Dr. Debby; path is glioblastoma, IDH pending   01/22/2024 - 01/22/2024 Chemotherapy   Patient is on Treatment Plan : BRAIN GLIOBLASTOMA Radiation Therapy With Concurrent Temozolomide  75 mg/m2 Daily Followed By Sequential Maintenance Temozolomide  x 6-12 cycles     06/11/2024 -  Chemotherapy   Patient is on Treatment Plan : BRAIN Temozolomide  D1-5 + Bevacizumab  (10) D1,15 q28d         Interval History: Nicholas Stevens presents today; he dosed cycle #4 5-day TMZ starting yesterday 07/28/24.  Today he complains of some bleeding from his gums, teeth, new from prior.  Duration has been two weeks since prior avastin  infusion.  He remains off decadron .  No new or progressive changes.  Denies headaches or seizures.  Prior- presents for follow up after recent clinical changes.  He describes relatively rapid onset of left sided weakness starting ~10 days ago.  He was unable to walk and use the left arm meaningfully.  No seizure activity appreciated.  Decadron  was started 4mg  twice per day after 3-4 days of the weakness.  Once the steroid was started, he returned back to his prior baseline.  Continues on the Keppra .  Denies severe headaches.   H+P (01/08/24) Patient presented with several days of left sided clumsiness, impaired coordination.  Was found to have large right frontal mass, c/w primary brain tumor.  He underwent craniotomy, resection with Dr. Debby on 12/18/23; path demonstrated  glioblastoma.  Following surgery, he had persistent right sided weakness, he is currently in outpatient PT following discharge from rehab.  No seizures, headaches.     Medications: Current Outpatient Medications on File Prior to Visit  Medication Sig Dispense Refill   apixaban  (ELIQUIS ) 5 MG TABS tablet Take 1 tablet (5 mg total) by mouth 2 (two) times daily. 60 tablet 3   Baclofen  5 MG TABS Take 1 tablet (5 mg total) by mouth every 6 (six) hours as needed (hiccups). 90 tablet 1   camphor-menthol  (SARNA) lotion Apply topically as needed for itching. 222 mL 0   Cyanocobalamin  (B-12 PO) Take 1 tablet by mouth daily.     fluconazole  (DIFLUCAN ) 100 MG tablet Take 2 tabs on day#1, then 1 tab daily on Days #2-10 11 tablet 1   furosemide  (LASIX ) 20 MG tablet TAKE 1 TABLET(20 MG) BY MOUTH DAILY 90 tablet 0   latanoprost  (XALATAN ) 0.005 % ophthalmic solution Place 1 drop into both eyes at bedtime. 2.5 mL 2   levETIRAcetam  (KEPPRA ) 500 MG tablet Take 2 tablets (1,000 mg total) by mouth 2 (two) times daily. 120 tablet 2   magic mouthwash (nystatin , lidocaine , diphenhydrAMINE , alum & mag hydroxide) suspension Swish and swallow 5 mLs by mouth 4 (four) times daily as needed for mouth pain. 140 mL 1   Multiple Vitamin (MULTIVITAMIN WITH MINERALS) TABS tablet Take 1 tablet by mouth daily.     olmesartan  (BENICAR ) 20 MG tablet Take 1 tablet (20 mg total) by mouth daily. 90 tablet 3   Omega-3 Fatty Acids (FISH OIL) 1000 MG CAPS  Take 1,000 mg by mouth daily.     pantoprazole  (PROTONIX ) 40 MG tablet Take 1 tablet (40 mg total) by mouth 2 (two) times daily. 180 tablet 3   pimecrolimus  (ELIDEL ) 1 % cream Apply 1 Application topically 2 (two) times daily as needed (Dermatitis).     Semaglutide -Weight Management (WEGOVY ) 0.25 MG/0.5ML SOAJ Inject 0.25 mg into the skin once a week. 2 mL 2   senna-docusate (SENOKOT-S) 8.6-50 MG tablet Take 2 tablets by mouth daily at 6 (six) AM. 60 tablet 0   temozolomide  (TEMODAR ) 100  MG capsule Take 2 capsules (200 mg total) by mouth daily. (Take with one 180 mg capsule for total daily dose of 380 mg). Take for 5 days on, 23 days off. Repeat every 28 days. May take on an empty stomach to decrease nausea & vomiting. 10 capsule 0   temozolomide  (TEMODAR ) 100 MG capsule Take 1 capsule (100 mg total) by mouth daily. Take for 5 days on, 23 days off. Repeat every 28 days. Take on an empty stomach 1 hour before or 2 hours after a meal. 5 capsule 0   temozolomide  (TEMODAR ) 140 MG capsule Take 2 capsules (280 mg total) by mouth daily. Take for 5 days on, 23 days off. Repeat every 28 days. Take on an empty stomach 1 hour before or 2 hours after a meal. 10 capsule 0   triamcinolone  cream (KENALOG ) 0.1 % Apply 1 Application topically daily. Use on affected areas for 2 weeks, then stop for 2 weeks. Repeat until clear if needed. 453 g 0   triamterene -hydrochlorothiazide  (MAXZIDE -25) 37.5-25 MG tablet Take 1 tablet by mouth daily. 90 tablet 3   zolpidem  (AMBIEN ) 10 MG tablet TAKE 1 TABLET BY MOUTH EVERYDAY AT BEDTIME 90 tablet 1   temozolomide  (TEMODAR ) 180 MG capsule Take 1 capsule (180 mg total) by mouth daily. (Take with two 100 mg capsules for total daily dose of 380 mg). Take for 5 days on, 23 days off. Repeat every 28 days. May take on an empty stomach to decrease nausea & vomiting. (Patient not taking: Reported on 07/29/2024) 5 capsule 0   No current facility-administered medications on file prior to visit.    Allergies: No Known Allergies Past Medical History:  Past Medical History:  Diagnosis Date   DDD (degenerative disc disease), lumbar    HNP (herniated nucleus pulposus)    Hypertension    Liver hemangioma    Morbid obesity (HCC)    OSA (obstructive sleep apnea)    no cpap used since weight loss   Overweight(278.02)    Plantar fasciitis of right foot    Sleep apnea    Tinea barbae    Past Surgical History:  Past Surgical History:  Procedure Laterality Date   BREATH TEK H  PYLORI N/A 08/20/2013   Procedure: BREATH TEK H PYLORI;  Surgeon: Donnice KATHEE Lunger, MD;  Location: [facility];  Service: General;  Laterality: N/A;   COLONOSCOPY WITH PROPOFOL  N/A 05/05/2022   Procedure: COLONOSCOPY WITH PROPOFOL ;  Surgeon: Albertus Gordy HERO, MD;  Location: [facility];  Service: Gastroenterology;  Laterality: N/A;   CRANIOTOMY Right 12/18/2023   Procedure: Right Frontal Stereotactic Craniotomy for Resection of Tumor;  Surgeon: Debby Dorn MATSU, MD;  Location: [facility];  Service: Neurosurgery;  Laterality: Right;   ESOPHAGOGASTRODUODENOSCOPY (EGD) WITH PROPOFOL  N/A 03/15/2023   Procedure: ESOPHAGOGASTRODUODENOSCOPY (EGD) WITH PROPOFOL ;  Surgeon: Avram Lupita BRAVO, MD;  Location: [facility];  Service: Gastroenterology;  Laterality: N/A;   LAPAROSCOPIC APPENDECTOMY  2007   Dr Curvin   LAPAROSCOPIC GASTRIC SLEEVE RESECTION N/A 10/08/2013   Procedure: LAPAROSCOPIC SLEEVE GASTRECTOMY;  Surgeon: Donnice KATHEE Lunger, MD;  Location: [facility];  Service: General;  Laterality: N/A;   LUMBAR LAMINECTOMY/DECOMPRESSION MICRODISCECTOMY N/A 09/16/2015   Procedure: MICRO LUMBAR DECOMPRESSION, MICRODISCECTOMY L5 - S1;  Surgeon: Reyes Billing, MD;  Location: [facility];  Service: Orthopedics;  Laterality: N/A;   POLYPECTOMY  05/05/2022   Procedure: POLYPECTOMY;  Surgeon: Albertus Gordy HERO, MD;  Location: [facility];  Service: Gastroenterology;;   ROTATOR CUFF REPAIR Right 2005   Dr Harden   Social History:  Social History   Socioeconomic History   Marital status: Married    Spouse name: Bernardino Dowell   Number of children: 3   Years of education: Not on file   Highest education level: 12th grade  Occupational History   Occupation: Doctor, general practice PD    Employer: UNEMPLOYED  Tobacco Use   Smoking status: Never    Passive exposure: Never   Smokeless tobacco: Never  Vaping Use   Vaping status: Never Used  Substance and Sexual Activity   Alcohol  use: No   Drug use: No   Sexual activity: Not on file  Other Topics  Concern   Not on file  Social History Narrative   Married 18+ years   3 children - ages approx 75, 9 and 5...daughter age 94 w/ DM type 1   Social Drivers of Corporate investment banker Strain: Low Risk  (04/02/2023)   Overall Financial Resource Strain (CARDIA)    Difficulty of Paying Living Expenses: Not hard at all  Food Insecurity: No Food Insecurity (12/18/2023)   Hunger Vital Sign    Worried About Running Out of Food in the Last Year: Never true    Ran Out of Food in the Last Year: Never true  Transportation Needs: No Transportation Needs (12/18/2023)   PRAPARE - Administrator, Civil Service (Medical): No    Lack of Transportation (Non-Medical): No  Physical Activity: Insufficiently Active (04/02/2023)   Exercise Vital Sign    Days of Exercise per Week: 4 days    Minutes of Exercise per Session: 30 min  Stress: No Stress Concern Present (04/02/2023)   [employer] of Occupational Health - Occupational Stress Questionnaire    Feeling of Stress : Not at all  Social Connections: Socially Integrated (04/02/2023)   Social Connection and Isolation Panel    Frequency of Communication with Friends and Family: More than three times a week    Frequency of Social Gatherings with Friends and Family: Once a week    Attends Religious Services: More than 4 times per year    Active Member of [employer] or Organizations: Yes    Attends Engineer, structural: More than 4 times per year    Marital Status: Married  Catering manager Violence: Not At Risk (12/18/2023)   Humiliation, Afraid, Rape, and Kick questionnaire    Fear of Current or Ex-Partner: No    Emotionally Abused: No    Physically Abused: No    Sexually Abused: No   Family History:  Family History  Problem Relation Age of Onset   Other Mother        hx of DJD   Liver disease Father        ETOH, Hep C   Hypertension Father    Colon cancer Neg Hx    Colon polyps Neg Hx    Esophageal cancer Neg Hx  Rectal  cancer Neg Hx    Stomach cancer Neg Hx     Review of Systems: Constitutional: Doesn't report fevers, chills or abnormal weight loss Eyes: Doesn't report blurriness of vision Ears, nose, mouth, throat, and face: Doesn't report sore throat Respiratory: Doesn't report cough, dyspnea or wheezes Cardiovascular: Doesn't report palpitation, chest discomfort  Gastrointestinal:  Doesn't report nausea, constipation, diarrhea GU: Doesn't report incontinence Skin: Doesn't report skin rashes Neurological: Per HPI Musculoskeletal: Doesn't report joint pain Behavioral/Psych: Doesn't report anxiety  Physical Exam: Vitals:   07/29/24 1357  BP: (!) 111/99  Pulse: 81  Resp: 20  Temp: (!) 97 F (36.1 C)  SpO2: 98%    KPS: 70. General: Alert, cooperative, pleasant, in no acute distress Head: Normal EENT: No conjunctival injection or scleral icterus.  Lungs: Resp effort normal Cardiac: Regular rate Abdomen: Non-distended abdomen Skin: No rashes cyanosis or petechiae. Extremities: No clubbing or edema  Neurologic Exam: Mental Status: Awake, alert, attentive to examiner. Oriented to self and environment. Language is fluent with intact comprehension.  Cranial Nerves: Visual acuity is grossly normal. Visual fields are full. Extra-ocular movements intact. No ptosis. Face is symmetric Motor: Tone and bulk are normal. Power is 4+/5 in left arm and 4/5 leg. Reflexes are symmetric, no pathologic reflexes present.  Sensory: Intact to light touch Gait: Hemiparetic   Labs: I have reviewed the data as listed    Component Value Date/Time   NA 137 07/15/2024 1140   K 3.7 07/15/2024 1140   CL 104 07/15/2024 1140   CO2 26 07/15/2024 1140   GLUCOSE 115 (H) 07/15/2024 1140   BUN 10 07/15/2024 1140   CREATININE 1.05 07/15/2024 1140   CALCIUM 9.6 07/15/2024 1140   PROT 7.3 07/15/2024 1140   ALBUMIN 4.0 07/15/2024 1140   AST 30 07/15/2024 1140   ALT 55 (H) 07/15/2024 1140   ALKPHOS 38 07/15/2024  1140   BILITOT 0.6 07/15/2024 1140   GFRNONAA >60 07/15/2024 1140   GFRAA >60 09/15/2015 1520   Lab Results  Component Value Date   WBC 3.5 (L) 07/29/2024   NEUTROABS 1.9 07/29/2024   HGB 15.1 07/29/2024   HCT 44.3 07/29/2024   MCV 86.0 07/29/2024   PLT 205 07/29/2024    Imaging:  [facility] Clinician Interpretation: I have personally reviewed the CNS images as listed.  My interpretation, in the context of the patient's clinical presentation, is stable disease  MR BRAIN W WO CONTRAST Result Date: 07/02/2024 CLINICAL DATA:  Brain/CNS neoplasm, assess treatment response EXAM: MRI HEAD WITHOUT AND WITH CONTRAST TECHNIQUE: Multiplanar, multiecho pulse sequences of the brain and surrounding structures were obtained without and with intravenous contrast. CONTRAST:  10mL GADAVIST  GADOBUTROL  1 MMOL/ML IV SOLN COMPARISON:  MRI of the head dated June 03, 2024. FINDINGS: Brain: The patient is again noted to be status post right frontal parietal craniotomy for resection of lesion within the right posteromedial frontal lobe. There has been significant interval decrease in size of the surgical resection cavity from approximately 2.7 x 4.8 x 4.8 cm to approximately 1.9 x 3.0 x 3.4 cm. There is also significantly less peripheral enhancement now present. There is markedly less surrounding cerebral edema and mass effect. There is no longer shift of the midline structures to the left. There is residual increased T2 signal present throughout the white matter of the right frontal lobe. There is residual hemosiderin staining around the margins of the resection cavity. Vascular: Normal vascular flow voids. Skull and upper cervical spine: Status post  right frontal parietal craniotomy. Normal bone marrow signal. No osseous lesions. Sinuses/Orbits: Clear paranasal sinuses.  Normal orbits. Other: None. IMPRESSION: 1. There has been significant interval improvement of the surgical resection cavity in the right posterior frontal  lobe. The cavity has decreased in size and demonstrates markedly less peripheral enhancement. There has also been interval marked improvement of extensive cerebral edema. The findings are compatible with a positive response to therapy. Electronically Signed   By: Evalene Coho M.D.   On: 07/02/2024 13:50    Assessment/Plan Frontal glioblastoma (HCC) - Plan: Total Protein, Urine dipstick  Seizure disorder (HCC)  Nicholas Stevens is clinically stable today, having initiated cycle #4 Temodar  (8/3) and avastin .  Labs are within normal limits.  We recommended continuing treatment with cycle #4 Temozolomide , 150 mg/m2, on for five days and off for twenty three days in twenty eight day cycles. The patient will have a complete blood count performed on days 21 and 28 of each cycle, and a comprehensive metabolic panel performed on day 28 of each cycle. Labs may need to be performed more often. Zofran  will prescribed for home use for nausea/vomiting.   Patient elected to proceed with avastin , dose reduced to 7.5mg /kg IV 2q weeks given recent minor bleeding.  Avastin  will be helpful as concurrent therapy given burden of enhancing tumor and steroid requirement.  We reviewed side effects of avastin , including hypertension, bleeding/clotting events, wound healing impairment.  The patient will have a complete blood count, a comprehensive metabolic panel, and urine protein performed prior to each avastin  infusion. Labs may need to be performed more often. Zofran  will prescribed for home use for nausea/vomiting.    Informed consent was obtained verbally at bedside to proceed with oral chemotherapy and avastin .  Chemotherapy should be held for the following:  ANC less than 1,000  Platelets less than 100,000  LFT or creatinine greater than 2x ULN  If clinical concerns/contraindications develop   Avastin  should be held for the following:  ANC less than 500  Platelets less than 50,000  LFT or  creatinine greater than 2x ULN  If clinical concerns/contraindications develop Informed consent was obtained verbally at bedside to proceed with oral chemotherapy.  Decadron  should remain off if tolerated.  Will con't Eliquis  5mg  BID for DVT.  We ask that Nicholas Stevens return to clinic in 2 weeks with labs prior to cycle #4, day 16/28, or sooner as needed.  All questions were answered. The patient knows to call the clinic with any problems, questions or concerns. No barriers to learning were detected.  The total time spent in the encounter was 30 minutes and more than 50% was on counseling and review of test results   Arthea MARLA Manns, MD Medical Director of Neuro-Oncology [facility] 07/29/24 2:06 PM

## 2024-07-29 NOTE — Patient Instructions (Signed)
 [facility] - A DEPT OF [facility]  Discharge Instructions: Thank you for choosing [facility] to provide your oncology and hematology care.   If you have a lab appointment with the Cancer Center, please go directly to the Cancer Center and check in at the registration area.   Wear comfortable clothing and clothing appropriate for easy access to any Portacath or PICC line.   We strive to give you quality time with your provider. You may need to reschedule your appointment if you arrive late (15 or more minutes).  Arriving late affects you and other patients whose appointments are after yours.  Also, if you miss three or more appointments without notifying the office, you may be dismissed from the clinic at the provider's discretion.      For prescription refill requests, have your pharmacy contact our office and allow 72 hours for refills to be completed.    Today you received the following chemotherapy and/or immunotherapy agents: Bevacizumab      To help prevent nausea and vomiting after your treatment, we encourage you to take your nausea medication as directed.  BELOW ARE SYMPTOMS THAT SHOULD BE REPORTED IMMEDIATELY: *FEVER GREATER THAN 100.4 F (38 C) OR HIGHER *CHILLS OR SWEATING *NAUSEA AND VOMITING THAT IS NOT CONTROLLED WITH YOUR NAUSEA MEDICATION *UNUSUAL SHORTNESS OF BREATH *UNUSUAL BRUISING OR BLEEDING *URINARY PROBLEMS (pain or burning when urinating, or frequent urination) *BOWEL PROBLEMS (unusual diarrhea, constipation, pain near the anus) TENDERNESS IN MOUTH AND THROAT WITH OR WITHOUT PRESENCE OF ULCERS (sore throat, sores in mouth, or a toothache) UNUSUAL RASH, SWELLING OR PAIN  UNUSUAL VAGINAL DISCHARGE OR ITCHING   Items with * indicate a potential emergency and should be followed up as soon as possible or go to the Emergency Department if any problems should occur.  Please show the CHEMOTHERAPY ALERT CARD or  IMMUNOTHERAPY ALERT CARD at check-in to the Emergency Department and triage nurse.  Should you have questions after your visit or need to cancel or reschedule your appointment, please contact [facility] - A DEPT OF [facility]  Dept: [phone]  and follow the prompts.  Office hours are 8:00 a.m. to 4:30 p.m. Monday - Friday. Please note that voicemails left after 4:00 p.m. may not be returned until the following business day.  We are closed weekends and major holidays. You have access to a nurse at all times for urgent questions. Please call the main number to the clinic Dept: [phone] and follow the prompts.   For any non-urgent questions, you may also contact your provider using MyChart. We now offer e-Visits for anyone 67 and older to request care online for non-urgent symptoms. For details visit [email or web address].   Also download the MyChart app! Go to the app store, search "MyChart", open the app, select [facility], and log in with your MyChart username and password.

## 2024-07-30 ENCOUNTER — Encounter: Payer: Self-pay | Admitting: Internal Medicine

## 2024-08-01 ENCOUNTER — Inpatient Hospital Stay: Admitting: Internal Medicine

## 2024-08-02 ENCOUNTER — Encounter: Payer: Self-pay | Admitting: Internal Medicine

## 2024-08-04 ENCOUNTER — Other Ambulatory Visit: Payer: Self-pay | Admitting: Internal Medicine

## 2024-08-04 DIAGNOSIS — C711 Malignant neoplasm of frontal lobe: Secondary | ICD-10-CM

## 2024-08-05 ENCOUNTER — Other Ambulatory Visit: Payer: Self-pay

## 2024-08-06 ENCOUNTER — Encounter: Payer: Self-pay | Admitting: Internal Medicine

## 2024-08-06 NOTE — Progress Notes (Signed)
 Brain Tumor Assessment   Clinical social worker met with patient / caregiver wife to complete assessments.      Patient distress screen score:     08/06/2024    9:52 AM  ONCBCN DISTRESS SCREENING  Screening Type Other (comment)  How much distress have you been experiencing in the past week? (0-10) 1  Emotional concerns type --   Caregiver distress screen score: 4 Significant physical limits/changes identified:  Yes: pt experiencing more trouble w/ appetite, nausea/vomiting and mouth sores       MOCA score:     08/06/2024    9:47 AM 02/12/2024    2:06 PM  Montreal Cognitive Assessment   Visuospatial/ Executive (0/5) 4 4  Naming (0/3) 3 3  Attention: Read list of digits (0/2) 2 2  Attention: Read list of letters (0/1) 1 1  Attention: Serial 7 subtraction starting at 100 (0/3) 3 3  Language: Repeat phrase (0/2) 2 2  Language : Fluency (0/1) 1 1  Abstraction (0/2) 2 2  Delayed Recall (0/5) 2 4  Orientation (0/6) 6 6  Total 26 28     WHO Quality of Life scores:   Overall quality of life: 50   Physical health: 29   Psychological: 37   Social relationship: 60   Environment: 78 Significant findings: physical issues have impacted score     Devere JONELLE Manna, LCSW

## 2024-08-09 ENCOUNTER — Other Ambulatory Visit: Payer: Self-pay

## 2024-08-09 ENCOUNTER — Telehealth: Payer: Self-pay

## 2024-08-09 ENCOUNTER — Encounter: Payer: Self-pay | Admitting: Internal Medicine

## 2024-08-09 DIAGNOSIS — C711 Malignant neoplasm of frontal lobe: Secondary | ICD-10-CM

## 2024-08-09 NOTE — Telephone Encounter (Signed)
 Called Mr. Heindel to let him know we shifted his appt times around to a little later and he was fine with the changed. Appts have been adjusted. Andrea CHRISTELLA Plunk, RN

## 2024-08-12 ENCOUNTER — Encounter: Payer: Self-pay | Admitting: Internal Medicine

## 2024-08-12 ENCOUNTER — Inpatient Hospital Stay

## 2024-08-12 ENCOUNTER — Inpatient Hospital Stay (HOSPITAL_BASED_OUTPATIENT_CLINIC_OR_DEPARTMENT_OTHER): Admitting: Internal Medicine

## 2024-08-12 VITALS — BP 128/93 | HR 84 | Temp 98.4°F | Resp 18 | Ht 74.0 in | Wt 287.6 lb

## 2024-08-12 DIAGNOSIS — C711 Malignant neoplasm of frontal lobe: Secondary | ICD-10-CM

## 2024-08-12 DIAGNOSIS — G40909 Epilepsy, unspecified, not intractable, without status epilepticus: Secondary | ICD-10-CM | POA: Diagnosis not present

## 2024-08-12 DIAGNOSIS — Z5112 Encounter for antineoplastic immunotherapy: Secondary | ICD-10-CM | POA: Diagnosis not present

## 2024-08-12 LAB — CMP (CANCER CENTER ONLY)
ALT: 51 U/L — ABNORMAL HIGH (ref 0–44)
AST: 39 U/L (ref 15–41)
Albumin: 4.2 g/dL (ref 3.5–5.0)
Alkaline Phosphatase: 34 U/L — ABNORMAL LOW (ref 38–126)
Anion gap: 7 (ref 5–15)
BUN: 11 mg/dL (ref 6–20)
CO2: 26 mmol/L (ref 22–32)
Calcium: 9.4 mg/dL (ref 8.9–10.3)
Chloride: 105 mmol/L (ref 98–111)
Creatinine: 1.03 mg/dL (ref 0.61–1.24)
GFR, Estimated: >60 mL/min (ref >60)
Glucose, Bld: 126 mg/dL — ABNORMAL HIGH (ref 70–99)
Potassium: 3.1 mmol/L — ABNORMAL LOW (ref 3.5–5.1)
Sodium: 138 mmol/L (ref 135–145)
Total Bilirubin: 1 mg/dL (ref 0.0–1.2)
Total Protein: 6.9 g/dL (ref 6.5–8.1)

## 2024-08-12 LAB — CBC WITH DIFFERENTIAL (CANCER CENTER ONLY)
Abs Immature Granulocytes: 0 K/uL (ref 0.00–0.07)
Basophils Absolute: 0 K/uL (ref 0.0–0.1)
Basophils Relative: 0 %
Eosinophils Absolute: 0.1 K/uL (ref 0.0–0.5)
Eosinophils Relative: 3 %
HCT: 40.1 % (ref 39.0–52.0)
Hemoglobin: 14.2 g/dL (ref 13.0–17.0)
Immature Granulocytes: 0 %
Lymphocytes Relative: 30 %
Lymphs Abs: 1.1 K/uL (ref 0.7–4.0)
MCH: 30.1 pg (ref 26.0–34.0)
MCHC: 35.4 g/dL (ref 30.0–36.0)
MCV: 85 fL (ref 80.0–100.0)
Monocytes Absolute: 0.3 K/uL (ref 0.1–1.0)
Monocytes Relative: 9 %
Neutro Abs: 2.2 K/uL (ref 1.7–7.7)
Neutrophils Relative %: 58 %
Platelet Count: 229 K/uL (ref 150–400)
RBC: 4.72 MIL/uL (ref 4.22–5.81)
RDW: 14.5 % (ref 11.5–15.5)
WBC Count: 3.7 K/uL — ABNORMAL LOW (ref 4.0–10.5)
nRBC: 0 % (ref 0.0–0.2)

## 2024-08-12 LAB — TOTAL PROTEIN, URINE DIPSTICK: Protein, ur: NEGATIVE mg/dL

## 2024-08-12 MED ORDER — SODIUM CHLORIDE 0.9% FLUSH: 10.0000 mL | INTRAVENOUS | Status: DC | PRN

## 2024-08-12 MED ORDER — SODIUM CHLORIDE 0.9 % IV SOLN
INTRAVENOUS | Status: DC
Start: 2024-08-12 — End: 2024-08-12

## 2024-08-12 MED ORDER — APIXABAN 2.5 MG PO TABS: 2.5000 mg | ORAL_TABLET | Freq: Two times a day (BID) | ORAL | 2 refills | Status: DC

## 2024-08-12 MED ORDER — SODIUM CHLORIDE 0.9 % IV SOLN
7.5000 mg/kg | Freq: Once | INTRAVENOUS | Status: AC
  Administered 2024-08-12: 1000 mg via INTRAVENOUS
  Filled 2024-08-12: qty 32

## 2024-08-12 NOTE — Progress Notes (Signed)
 [facility] at [facility] [address]  [place], [place] [address] [phone]   Interval Evaluation  Date of Service: 08/12/24 Patient Name: Nicholas Stevens Patient MRN: [id number] Patient DOB: [birth year] Provider: Arthea MARLA Manns, MD  Identifying Statement:  Nicholas Stevens is a 53 y.o. male with right frontal glioblastoma    Oncologic History: Oncology History  Frontal glioblastoma (HCC)  12/18/2023 Surgery   Craniotomy, resection of right frontal mass with Dr. Debby; path is glioblastoma, IDH pending   01/22/2024 - 01/22/2024 Chemotherapy   Patient is on Treatment Plan : BRAIN GLIOBLASTOMA Radiation Therapy With Concurrent Temozolomide  75 mg/m2 Daily Followed By Sequential Maintenance Temozolomide  x 6-12 cycles     06/11/2024 -  Chemotherapy   Patient is on Treatment Plan : BRAIN Temozolomide  D1-5 + Bevacizumab  (10) D1,15 q28d         Interval History: Nicholas Stevens presents today; he dosed cycle #4 5-day TMZ starting yesterday 07/28/24.  Today he complains of ongoing bleeding from his gums, teeth.  He received some mouth washes from his dentist.  He remains off decadron .  No new or progressive changes.  Denies headaches or seizures.  Prior- presents for follow up after recent clinical changes.  He describes relatively rapid onset of left sided weakness starting ~10 days ago.  He was unable to walk and use the left arm meaningfully.  No seizure activity appreciated.  Decadron  was started 4mg  twice per day after 3-4 days of the weakness.  Once the steroid was started, he returned back to his prior baseline.  Continues on the Keppra .  Denies severe headaches.   H+P (01/08/24) Patient presented with several days of left sided clumsiness, impaired coordination.  Was found to have large right frontal mass, c/w primary brain tumor.  He underwent craniotomy, resection with Dr. Debby on 12/18/23; path demonstrated glioblastoma.  Following  surgery, he had persistent right sided weakness, he is currently in outpatient PT following discharge from rehab.  No seizures, headaches.     Medications: Current Outpatient Medications on File Prior to Visit  Medication Sig Dispense Refill   apixaban  (ELIQUIS ) 5 MG TABS tablet Take 1 tablet (5 mg total) by mouth 2 (two) times daily. 60 tablet 3   Baclofen  5 MG TABS Take 1 tablet (5 mg total) by mouth every 6 (six) hours as needed (hiccups). 90 tablet 1   camphor-menthol  (SARNA) lotion Apply topically as needed for itching. 222 mL 0   Cyanocobalamin  (B-12 PO) Take 1 tablet by mouth daily.     fluconazole  (DIFLUCAN ) 100 MG tablet Take 2 tabs on day#1, then 1 tab daily on Days #2-10 11 tablet 1   furosemide  (LASIX ) 20 MG tablet TAKE 1 TABLET(20 MG) BY MOUTH DAILY 90 tablet 0   latanoprost  (XALATAN ) 0.005 % ophthalmic solution Place 1 drop into both eyes at bedtime. 2.5 mL 2   levETIRAcetam  (KEPPRA ) 500 MG tablet Take 2 tablets (1,000 mg total) by mouth 2 (two) times daily. 120 tablet 2   magic mouthwash (nystatin , lidocaine , diphenhydrAMINE , alum & mag hydroxide) suspension Swish and swallow 5 mLs by mouth 4 (four) times daily as needed for mouth pain. 140 mL 1   Multiple Vitamin (MULTIVITAMIN WITH MINERALS) TABS tablet Take 1 tablet by mouth daily.     olmesartan  (BENICAR ) 20 MG tablet Take 1 tablet (20 mg total) by mouth daily. 90 tablet 3   Omega-3 Fatty Acids (FISH OIL) 1000 MG CAPS Take 1,000 mg by  mouth daily.     pantoprazole  (PROTONIX ) 40 MG tablet Take 1 tablet (40 mg total) by mouth 2 (two) times daily. 180 tablet 3   pimecrolimus  (ELIDEL ) 1 % cream Apply 1 Application topically 2 (two) times daily as needed (Dermatitis).     Semaglutide -Weight Management (WEGOVY ) 0.25 MG/0.5ML SOAJ Inject 0.25 mg into the skin once a week. 2 mL 2   senna-docusate (SENOKOT-S) 8.6-50 MG tablet Take 2 tablets by mouth daily at 6 (six) AM. 60 tablet 0   temozolomide  (TEMODAR ) 100 MG capsule Take 2 capsules  (200 mg total) by mouth daily. (Take with one 180 mg capsule for total daily dose of 380 mg). Take for 5 days on, 23 days off. Repeat every 28 days. May take on an empty stomach to decrease nausea & vomiting. 10 capsule 0   temozolomide  (TEMODAR ) 100 MG capsule Take 1 capsule (100 mg total) by mouth daily. Take for 5 days on, 23 days off. Repeat every 28 days. Take on an empty stomach 1 hour before or 2 hours after a meal. 5 capsule 0   temozolomide  (TEMODAR ) 140 MG capsule Take 2 capsules (280 mg total) by mouth daily. Take for 5 days on, 23 days off. Repeat every 28 days. Take on an empty stomach 1 hour before or 2 hours after a meal. 10 capsule 0   temozolomide  (TEMODAR ) 180 MG capsule Take 1 capsule (180 mg total) by mouth daily. (Take with two 100 mg capsules for total daily dose of 380 mg). Take for 5 days on, 23 days off. Repeat every 28 days. May take on an empty stomach to decrease nausea & vomiting. (Patient not taking: Reported on 07/29/2024) 5 capsule 0   triamcinolone  cream (KENALOG ) 0.1 % Apply 1 Application topically daily. Use on affected areas for 2 weeks, then stop for 2 weeks. Repeat until clear if needed. 453 g 0   triamterene -hydrochlorothiazide  (MAXZIDE -25) 37.5-25 MG tablet Take 1 tablet by mouth daily. 90 tablet 3   zolpidem  (AMBIEN ) 10 MG tablet TAKE 1 TABLET BY MOUTH EVERYDAY AT BEDTIME 90 tablet 1   No current facility-administered medications on file prior to visit.    Allergies: No Known Allergies Past Medical History:  Past Medical History:  Diagnosis Date   DDD (degenerative disc disease), lumbar    HNP (herniated nucleus pulposus)    Hypertension    Liver hemangioma    Morbid obesity (HCC)    OSA (obstructive sleep apnea)    no cpap used since weight loss   Overweight(278.02)    Plantar fasciitis of right foot    Sleep apnea    Tinea barbae    Past Surgical History:  Past Surgical History:  Procedure Laterality Date   BREATH TEK H PYLORI N/A 08/20/2013    Procedure: BREATH TEK H PYLORI;  Surgeon: Donnice KATHEE Lunger, MD;  Location: [facility];  Service: General;  Laterality: N/A;   COLONOSCOPY WITH PROPOFOL  N/A 05/05/2022   Procedure: COLONOSCOPY WITH PROPOFOL ;  Surgeon: Albertus Gordy HERO, MD;  Location: [facility];  Service: Gastroenterology;  Laterality: N/A;   CRANIOTOMY Right 12/18/2023   Procedure: Right Frontal Stereotactic Craniotomy for Resection of Tumor;  Surgeon: Debby Dorn MATSU, MD;  Location: [facility];  Service: Neurosurgery;  Laterality: Right;   ESOPHAGOGASTRODUODENOSCOPY (EGD) WITH PROPOFOL  N/A 03/15/2023   Procedure: ESOPHAGOGASTRODUODENOSCOPY (EGD) WITH PROPOFOL ;  Surgeon: Avram Lupita BRAVO, MD;  Location: [facility];  Service: Gastroenterology;  Laterality: N/A;   LAPAROSCOPIC APPENDECTOMY  2007   Dr  Toth   LAPAROSCOPIC GASTRIC SLEEVE RESECTION N/A 10/08/2013   Procedure: LAPAROSCOPIC SLEEVE GASTRECTOMY;  Surgeon: Donnice KATHEE Lunger, MD;  Location: [facility];  Service: General;  Laterality: N/A;   LUMBAR LAMINECTOMY/DECOMPRESSION MICRODISCECTOMY N/A 09/16/2015   Procedure: MICRO LUMBAR DECOMPRESSION, MICRODISCECTOMY L5 - S1;  Surgeon: Reyes Billing, MD;  Location: [facility];  Service: Orthopedics;  Laterality: N/A;   POLYPECTOMY  05/05/2022   Procedure: POLYPECTOMY;  Surgeon: Albertus Gordy HERO, MD;  Location: [facility];  Service: Gastroenterology;;   ROTATOR CUFF REPAIR Right 2005   Dr Harden   Social History:  Social History   Socioeconomic History   Marital status: Married    Spouse name: Willmar Stockinger   Number of children: 3   Years of education: Not on file   Highest education level: 12th grade  Occupational History   Occupation: Doctor, general practice PD    Employer: UNEMPLOYED  Tobacco Use   Smoking status: Never    Passive exposure: Never   Smokeless tobacco: Never  Vaping Use   Vaping status: Never Used  Substance and Sexual Activity   Alcohol  use: No   Drug use: No   Sexual activity: Not on file  Other Topics Concern   Not on file   Social History Narrative   Married 18+ years   3 children - ages approx 62, 9 and 5...daughter age 42 w/ DM type 1   Social Drivers of Corporate investment banker Strain: Low Risk  (04/02/2023)   Overall Financial Resource Strain (CARDIA)    Difficulty of Paying Living Expenses: Not hard at all  Food Insecurity: No Food Insecurity (12/18/2023)   Hunger Vital Sign    Worried About Running Out of Food in the Last Year: Never true    Ran Out of Food in the Last Year: Never true  Transportation Needs: No Transportation Needs (12/18/2023)   PRAPARE - Administrator, Civil Service (Medical): No    Lack of Transportation (Non-Medical): No  Physical Activity: Insufficiently Active (04/02/2023)   Exercise Vital Sign    Days of Exercise per Week: 4 days    Minutes of Exercise per Session: 30 min  Stress: No Stress Concern Present (04/02/2023)   [employer] of Occupational Health - Occupational Stress Questionnaire    Feeling of Stress : Not at all  Social Connections: Socially Integrated (04/02/2023)   Social Connection and Isolation Panel    Frequency of Communication with Friends and Family: More than three times a week    Frequency of Social Gatherings with Friends and Family: Once a week    Attends Religious Services: More than 4 times per year    Active Member of [employer] or Organizations: Yes    Attends Engineer, structural: More than 4 times per year    Marital Status: Married  Catering manager Violence: Not At Risk (12/18/2023)   Humiliation, Afraid, Rape, and Kick questionnaire    Fear of Current or Ex-Partner: No    Emotionally Abused: No    Physically Abused: No    Sexually Abused: No   Family History:  Family History  Problem Relation Age of Onset   Other Mother        hx of DJD   Liver disease Father        ETOH, Hep C   Hypertension Father    Colon cancer Neg Hx    Colon polyps Neg Hx    Esophageal cancer Neg Hx    Rectal cancer  Neg Hx    Stomach  cancer Neg Hx     Review of Systems: Constitutional: Doesn't report fevers, chills or abnormal weight loss Eyes: Doesn't report blurriness of vision Ears, nose, mouth, throat, and face: Doesn't report sore throat Respiratory: Doesn't report cough, dyspnea or wheezes Cardiovascular: Doesn't report palpitation, chest discomfort  Gastrointestinal:  Doesn't report nausea, constipation, diarrhea GU: Doesn't report incontinence Skin: Doesn't report skin rashes Neurological: Per HPI Musculoskeletal: Doesn't report joint pain Behavioral/Psych: Doesn't report anxiety  Physical Exam: Vitals:   08/12/24 1501  BP: (!) 128/93  Pulse: 84  Resp: 18  Temp: 98.4 F (36.9 C)  SpO2: 99%    KPS: 70. General: Alert, cooperative, pleasant, in no acute distress Head: Normal EENT: No conjunctival injection or scleral icterus.  Lungs: Resp effort normal Cardiac: Regular rate Abdomen: Non-distended abdomen Skin: No rashes cyanosis or petechiae. Extremities: No clubbing or edema  Neurologic Exam: Mental Status: Awake, alert, attentive to examiner. Oriented to self and environment. Language is fluent with intact comprehension.  Cranial Nerves: Visual acuity is grossly normal. Visual fields are full. Extra-ocular movements intact. No ptosis. Face is symmetric Motor: Tone and bulk are normal. Power is 4+/5 in left arm and 4/5 leg. Reflexes are symmetric, no pathologic reflexes present.  Sensory: Intact to light touch Gait: Hemiparetic   Labs: I have reviewed the data as listed    Component Value Date/Time   NA 137 07/15/2024 1140   K 3.7 07/15/2024 1140   CL 104 07/15/2024 1140   CO2 26 07/15/2024 1140   GLUCOSE 115 (H) 07/15/2024 1140   BUN 10 07/15/2024 1140   CREATININE 1.05 07/15/2024 1140   CALCIUM 9.6 07/15/2024 1140   PROT 7.3 07/15/2024 1140   ALBUMIN 4.0 07/15/2024 1140   AST 30 07/15/2024 1140   ALT 55 (H) 07/15/2024 1140   ALKPHOS 38 07/15/2024 1140   BILITOT 0.6 07/15/2024  1140   GFRNONAA >60 07/15/2024 1140   GFRAA >60 09/15/2015 1520   Lab Results  Component Value Date   WBC 3.7 (L) 08/12/2024   NEUTROABS 2.2 08/12/2024   HGB 14.2 08/12/2024   HCT 40.1 08/12/2024   MCV 85.0 08/12/2024   PLT 229 08/12/2024     Assessment/Plan Frontal glioblastoma (HCC)  Seizure disorder (HCC)  Nicholas Stevens is clinically stable today, now in midst of cycle #4 Temodar  (day 16/28) and avastin .  Labs are within normal limits.  We recommended continuing treatment with cycle #4 Temozolomide , 150 mg/m2, on for five days and off for twenty three days in twenty eight day cycles. The patient will have a complete blood count performed on days 21 and 28 of each cycle, and a comprehensive metabolic panel performed on day 28 of each cycle. Labs may need to be performed more often. Zofran  will prescribed for home use for nausea/vomiting.   Patient elected to proceed with avastin , dose reduced to 7.5mg /kg IV 2q weeks given recent minor bleeding.  Avastin  will be helpful as concurrent therapy given burden of enhancing tumor and steroid requirement.  We reviewed side effects of avastin , including hypertension, bleeding/clotting events, wound healing impairment.  The patient will have a complete blood count, a comprehensive metabolic panel, and urine protein performed prior to each avastin  infusion. Labs may need to be performed more often. Zofran  will prescribed for home use for nausea/vomiting.    Informed consent was obtained verbally at bedside to proceed with oral chemotherapy and avastin .  Chemotherapy should be held for the following:  ANC less than 1,000  Platelets less than 100,000  LFT or creatinine greater than 2x ULN  If clinical concerns/contraindications develop   Avastin  should be held for the following:  ANC less than 500  Platelets less than 50,000  LFT or creatinine greater than 2x ULN  If clinical concerns/contraindications develop Informed  consent was obtained verbally at bedside to proceed with oral chemotherapy.  Decadron  should remain off if tolerated.  Recommended reducing Eliquis  to 2.5mg  BID given minor oral bleeding.  We ask that [employer] return to clinic in 2 weeks with labs prior to cycle #5, or sooner as needed.  Next MRI can follow cycle #5.  All questions were answered. The patient knows to call the clinic with any problems, questions or concerns. No barriers to learning were detected.  The total time spent in the encounter was 30 minutes and more than 50% was on counseling and review of test results   Arthea MARLA Manns, MD Medical Director of Neuro-Oncology [facility] [place] [facility] 08/12/24 3:05 PM

## 2024-08-21 ENCOUNTER — Other Ambulatory Visit: Payer: Self-pay | Admitting: Internal Medicine

## 2024-08-27 ENCOUNTER — Inpatient Hospital Stay

## 2024-08-27 ENCOUNTER — Encounter: Payer: Self-pay | Admitting: Internal Medicine

## 2024-08-27 ENCOUNTER — Inpatient Hospital Stay: Attending: Radiation Oncology

## 2024-08-27 ENCOUNTER — Inpatient Hospital Stay (HOSPITAL_BASED_OUTPATIENT_CLINIC_OR_DEPARTMENT_OTHER): Admitting: Internal Medicine

## 2024-08-27 VITALS — BP 125/90 | HR 87 | Temp 97.7°F | Resp 17 | Ht 74.0 in | Wt 281.0 lb

## 2024-08-27 DIAGNOSIS — Z79630 Long term (current) use of alkylating agent: Secondary | ICD-10-CM | POA: Insufficient documentation

## 2024-08-27 DIAGNOSIS — T451X5A Adverse effect of antineoplastic and immunosuppressive drugs, initial encounter: Secondary | ICD-10-CM | POA: Diagnosis not present

## 2024-08-27 DIAGNOSIS — D701 Agranulocytosis secondary to cancer chemotherapy: Secondary | ICD-10-CM | POA: Diagnosis not present

## 2024-08-27 DIAGNOSIS — G4700 Insomnia, unspecified: Secondary | ICD-10-CM

## 2024-08-27 DIAGNOSIS — C711 Malignant neoplasm of frontal lobe: Secondary | ICD-10-CM

## 2024-08-27 DIAGNOSIS — Z5112 Encounter for antineoplastic immunotherapy: Secondary | ICD-10-CM | POA: Insufficient documentation

## 2024-08-27 DIAGNOSIS — G40909 Epilepsy, unspecified, not intractable, without status epilepticus: Secondary | ICD-10-CM | POA: Diagnosis not present

## 2024-08-27 LAB — CBC WITH DIFFERENTIAL (CANCER CENTER ONLY)
Abs Immature Granulocytes: 0 K/uL (ref 0.00–0.07)
Basophils Absolute: 0 K/uL (ref 0.0–0.1)
Basophils Relative: 1 %
Eosinophils Absolute: 0.1 K/uL (ref 0.0–0.5)
Eosinophils Relative: 2 %
HCT: 43.2 % (ref 39.0–52.0)
Hemoglobin: 15 g/dL (ref 13.0–17.0)
Immature Granulocytes: 0 %
Lymphocytes Relative: 37 %
Lymphs Abs: 1 K/uL (ref 0.7–4.0)
MCH: 30 pg (ref 26.0–34.0)
MCHC: 34.7 g/dL (ref 30.0–36.0)
MCV: 86.4 fL (ref 80.0–100.0)
Monocytes Absolute: 0.3 K/uL (ref 0.1–1.0)
Monocytes Relative: 11 %
Neutro Abs: 1.4 K/uL — ABNORMAL LOW (ref 1.7–7.7)
Neutrophils Relative %: 49 %
Platelet Count: 201 K/uL (ref 150–400)
RBC: 5 MIL/uL (ref 4.22–5.81)
RDW: 14 % (ref 11.5–15.5)
WBC Count: 2.8 K/uL — ABNORMAL LOW (ref 4.0–10.5)
nRBC: 0 % (ref 0.0–0.2)

## 2024-08-27 MED ORDER — ZOLPIDEM TARTRATE 10 MG PO TABS: ORAL_TABLET | ORAL | 1 refills | Status: DC

## 2024-08-27 MED ORDER — SODIUM CHLORIDE 0.9 % IV SOLN
7.5000 mg/kg | Freq: Once | INTRAVENOUS | Status: AC
  Administered 2024-08-27: 1000 mg via INTRAVENOUS
  Filled 2024-08-27: qty 32

## 2024-08-27 MED ORDER — SODIUM CHLORIDE 0.9 % IV SOLN: INTRAVENOUS | Status: DC

## 2024-08-27 NOTE — Addendum Note (Signed)
 Addended by: Clemon Devaul V on: 08/27/2024 11:54 PM   Modules accepted: Orders

## 2024-08-27 NOTE — Progress Notes (Signed)
 [facility] [address]  [place], [place] [address] [phone]   Interval Evaluation  Date of Service: 08/27/24 Patient Name: Nicholas Stevens Patient MRN: [id number] Patient DOB: 03/17/1971 Provider: Arthea MARLA Manns, MD  Identifying Statement:  Nicholas Stevens is a 52 y.o. male with right frontal glioblastoma    Oncologic History: Oncology History  Frontal glioblastoma (HCC)  12/18/2023 Surgery   Craniotomy, resection of right frontal mass with Dr. Debby; path is glioblastoma, IDH pending   01/22/2024 - 01/22/2024 Chemotherapy   Patient is on Treatment Plan : BRAIN GLIOBLASTOMA Radiation Therapy With Concurrent Temozolomide  75 mg/m2 Daily Followed By Sequential Maintenance Temozolomide  x 6-12 cycles     06/11/2024 -  Chemotherapy   Patient is on Treatment Plan : BRAIN Temozolomide  D1-5 + Bevacizumab  (10) D1,15 q28d         Interval History: Nicholas Stevens presents today having completed cycle #4 5-day TMZ with concurrent avastin .  Recently returned from cruise vacation with his family.  He remains off decadron .  No new or progressive changes.  Denies headaches or seizures.  Prior- presents for follow up after recent clinical changes.  He describes relatively rapid onset of left sided weakness starting ~10 days ago.  He was unable to walk and use the left arm meaningfully.  No seizure activity appreciated.  Decadron  was started 4mg  twice per day after 3-4 days of the weakness.  Once the steroid was started, he returned back to his prior baseline.  Continues on the Keppra .  Denies severe headaches.   H+P (01/08/24) Patient presented with several days of left sided clumsiness, impaired coordination.  Was found to have large right frontal mass, c/w primary brain tumor.  He underwent craniotomy, resection with Dr. Debby on 12/18/23; path demonstrated glioblastoma.  Following surgery, he had persistent right sided weakness, he is  currently in outpatient PT following discharge from rehab.  No seizures, headaches.     Medications: Current Outpatient Medications on File Prior to Visit  Medication Sig Dispense Refill   apixaban  (ELIQUIS ) 2.5 MG TABS tablet Take 1 tablet (2.5 mg total) by mouth 2 (two) times daily. 60 tablet 2   Baclofen  5 MG TABS Take 1 tablet (5 mg total) by mouth every 6 (six) hours as needed (hiccups). 90 tablet 1   camphor-menthol  (SARNA) lotion Apply topically as needed for itching. 222 mL 0   chlorhexidine  (PERIDEX ) 0.12 % solution Use as directed 15 mLs in the mouth or throat 3 (three) times daily.     Cyanocobalamin  (B-12 PO) Take 1 tablet by mouth daily.     fluconazole  (DIFLUCAN ) 100 MG tablet Take 2 tabs on day#1, then 1 tab daily on Days #2-10 11 tablet 1   furosemide  (LASIX ) 20 MG tablet TAKE 1 TABLET(20 MG) BY MOUTH DAILY (Patient taking differently: Take 20 mg by mouth daily as needed.) 90 tablet 0   latanoprost  (XALATAN ) 0.005 % ophthalmic solution INSTILL 1 DROP IN BOTH EYES AT BEDTIME 2.5 mL 2   levETIRAcetam  (KEPPRA ) 500 MG tablet Take 2 tablets (1,000 mg total) by mouth 2 (two) times daily. 120 tablet 2   magic mouthwash (nystatin , lidocaine , diphenhydrAMINE , alum & mag hydroxide) suspension Swish and swallow 5 mLs by mouth 4 (four) times daily as needed for mouth pain. 140 mL 1   Multiple Vitamin (MULTIVITAMIN WITH MINERALS) TABS tablet Take 1 tablet by mouth daily.     olmesartan  (BENICAR ) 20 MG tablet Take 1 tablet (  20 mg total) by mouth daily. 90 tablet 3   Omega-3 Fatty Acids (FISH OIL) 1000 MG CAPS Take 1,000 mg by mouth daily.     pantoprazole  (PROTONIX ) 40 MG tablet Take 1 tablet (40 mg total) by mouth 2 (two) times daily. 180 tablet 3   pimecrolimus  (ELIDEL ) 1 % cream Apply 1 Application topically 2 (two) times daily as needed (Dermatitis).     senna-docusate (SENOKOT-S) 8.6-50 MG tablet Take 2 tablets by mouth daily at 6 (six) AM. 60 tablet 0   temozolomide  (TEMODAR ) 100 MG  capsule Take 2 capsules (200 mg total) by mouth daily. (Take with one 180 mg capsule for total daily dose of 380 mg). Take for 5 days on, 23 days off. Repeat every 28 days. May take on an empty stomach to decrease nausea & vomiting. 10 capsule 0   temozolomide  (TEMODAR ) 100 MG capsule Take 1 capsule (100 mg total) by mouth daily. Take for 5 days on, 23 days off. Repeat every 28 days. Take on an empty stomach 1 hour before or 2 hours after a meal. 5 capsule 0   temozolomide  (TEMODAR ) 140 MG capsule Take 2 capsules (280 mg total) by mouth daily. Take for 5 days on, 23 days off. Repeat every 28 days. Take on an empty stomach 1 hour before or 2 hours after a meal. 10 capsule 0   temozolomide  (TEMODAR ) 180 MG capsule Take 1 capsule (180 mg total) by mouth daily. (Take with two 100 mg capsules for total daily dose of 380 mg). Take for 5 days on, 23 days off. Repeat every 28 days. May take on an empty stomach to decrease nausea & vomiting. 5 capsule 0   triamcinolone  cream (KENALOG ) 0.1 % Apply 1 Application topically daily. Use on affected areas for 2 weeks, then stop for 2 weeks. Repeat until clear if needed. 453 g 0   triamterene -hydrochlorothiazide  (MAXZIDE -25) 37.5-25 MG tablet Take 1 tablet by mouth daily. 90 tablet 3   zolpidem  (AMBIEN ) 10 MG tablet TAKE 1 TABLET BY MOUTH EVERYDAY AT BEDTIME 90 tablet 1   No current facility-administered medications on file prior to visit.    Allergies: No Known Allergies Past Medical History:  Past Medical History:  Diagnosis Date   DDD (degenerative disc disease), lumbar    HNP (herniated nucleus pulposus)    Hypertension    Liver hemangioma    Morbid obesity (HCC)    OSA (obstructive sleep apnea)    no cpap used since weight loss   Overweight(278.02)    Plantar fasciitis of right foot    Sleep apnea    Tinea barbae    Past Surgical History:  Past Surgical History:  Procedure Laterality Date   BREATH TEK H PYLORI N/A 08/20/2013   Procedure: BREATH TEK  H PYLORI;  Surgeon: Donnice KATHEE Lunger, MD;  Location: [facility];  Service: General;  Laterality: N/A;   COLONOSCOPY WITH PROPOFOL  N/A 05/05/2022   Procedure: COLONOSCOPY WITH PROPOFOL ;  Surgeon: Albertus Gordy HERO, MD;  Location: [facility];  Service: Gastroenterology;  Laterality: N/A;   CRANIOTOMY Right 12/18/2023   Procedure: Right Frontal Stereotactic Craniotomy for Resection of Tumor;  Surgeon: Debby Dorn MATSU, MD;  Location: [facility];  Service: Neurosurgery;  Laterality: Right;   ESOPHAGOGASTRODUODENOSCOPY (EGD) WITH PROPOFOL  N/A 03/15/2023   Procedure: ESOPHAGOGASTRODUODENOSCOPY (EGD) WITH PROPOFOL ;  Surgeon: Avram Lupita BRAVO, MD;  Location: [facility];  Service: Gastroenterology;  Laterality: N/A;   LAPAROSCOPIC APPENDECTOMY  2007   Dr Curvin  LAPAROSCOPIC GASTRIC SLEEVE RESECTION N/A 10/08/2013   Procedure: LAPAROSCOPIC SLEEVE GASTRECTOMY;  Surgeon: Donnice KATHEE Lunger, MD;  Location: [facility];  Service: General;  Laterality: N/A;   LUMBAR LAMINECTOMY/DECOMPRESSION MICRODISCECTOMY N/A 09/16/2015   Procedure: MICRO LUMBAR DECOMPRESSION, MICRODISCECTOMY L5 - S1;  Surgeon: Reyes Billing, MD;  Location: [facility];  Service: Orthopedics;  Laterality: N/A;   POLYPECTOMY  05/05/2022   Procedure: POLYPECTOMY;  Surgeon: Albertus Gordy HERO, MD;  Location: [facility];  Service: Gastroenterology;;   ROTATOR CUFF REPAIR Right 2005   Dr Harden   Social History:  Social History   Socioeconomic History   Marital status: Married    Spouse name: Reynald Woods   Number of children: 3   Years of education: Not on file   Highest education level: 12th grade  Occupational History   Occupation: Doctor, general practice PD    Employer: UNEMPLOYED  Tobacco Use   Smoking status: Never    Passive exposure: Never   Smokeless tobacco: Never  Vaping Use   Vaping status: Never Used  Substance and Sexual Activity   Alcohol  use: No   Drug use: No   Sexual activity: Not on file  Other Topics Concern   Not on file  Social History  Narrative   Married 18+ years   3 children - ages approx 102, 9 and 5...daughter age 39 w/ DM type 1   Social Drivers of Corporate investment banker Strain: Low Risk  (04/02/2023)   Overall Financial Resource Strain (CARDIA)    Difficulty of Paying Living Expenses: Not hard at all  Food Insecurity: No Food Insecurity (12/18/2023)   Hunger Vital Sign    Worried About Running Out of Food in the Last Year: Never true    Ran Out of Food in the Last Year: Never true  Transportation Needs: No Transportation Needs (12/18/2023)   PRAPARE - Administrator, Civil Service (Medical): No    Lack of Transportation (Non-Medical): No  Physical Activity: Insufficiently Active (04/02/2023)   Exercise Vital Sign    Days of Exercise per Week: 4 days    Minutes of Exercise per Session: 30 min  Stress: No Stress Concern Present (04/02/2023)   [employer] of Occupational Health - Occupational Stress Questionnaire    Feeling of Stress : Not at all  Social Connections: Socially Integrated (04/02/2023)   Social Connection and Isolation Panel    Frequency of Communication with Friends and Family: More than three times a week    Frequency of Social Gatherings with Friends and Family: Once a week    Attends Religious Services: More than 4 times per year    Active Member of [employer] or Organizations: Yes    Attends Engineer, structural: More than 4 times per year    Marital Status: Married  Catering manager Violence: Not At Risk (12/18/2023)   Humiliation, Afraid, Rape, and Kick questionnaire    Fear of Current or Ex-Partner: No    Emotionally Abused: No    Physically Abused: No    Sexually Abused: No   Family History:  Family History  Problem Relation Age of Onset   Other Mother        hx of DJD   Liver disease Father        ETOH, Hep C   Hypertension Father    Colon cancer Neg Hx    Colon polyps Neg Hx    Esophageal cancer Neg Hx    Rectal cancer Neg Hx  Stomach cancer Neg Hx      Review of Systems: Constitutional: Doesn't report fevers, chills or abnormal weight loss Eyes: Doesn't report blurriness of vision Ears, nose, mouth, throat, and face: Doesn't report sore throat Respiratory: Doesn't report cough, dyspnea or wheezes Cardiovascular: Doesn't report palpitation, chest discomfort  Gastrointestinal:  Doesn't report nausea, constipation, diarrhea GU: Doesn't report incontinence Skin: Doesn't report skin rashes Neurological: Per HPI Musculoskeletal: Doesn't report joint pain Behavioral/Psych: Doesn't report anxiety  Physical Exam: Vitals:   08/27/24 1411  BP: (!) 125/90  Pulse: 87  Resp: 17  Temp: 97.7 F (36.5 C)  SpO2: 97%    KPS: 70. General: Alert, cooperative, pleasant, in no acute distress Head: Normal EENT: No conjunctival injection or scleral icterus.  Lungs: Resp effort normal Cardiac: Regular rate Abdomen: Non-distended abdomen Skin: No rashes cyanosis or petechiae. Extremities: No clubbing or edema  Neurologic Exam: Mental Status: Awake, alert, attentive to examiner. Oriented to self and environment. Language is fluent with intact comprehension.  Cranial Nerves: Visual acuity is grossly normal. Visual fields are full. Extra-ocular movements intact. No ptosis. Face is symmetric Motor: Tone and bulk are normal. Power is 4+/5 in left arm and 4/5 leg. Reflexes are symmetric, no pathologic reflexes present.  Sensory: Intact to light touch Gait: Hemiparetic   Labs: I have reviewed the data as listed    Component Value Date/Time   NA 138 08/12/2024 1451   K 3.1 (L) 08/12/2024 1451   CL 105 08/12/2024 1451   CO2 26 08/12/2024 1451   GLUCOSE 126 (H) 08/12/2024 1451   BUN 11 08/12/2024 1451   CREATININE 1.03 08/12/2024 1451   CALCIUM 9.4 08/12/2024 1451   PROT 6.9 08/12/2024 1451   ALBUMIN 4.2 08/12/2024 1451   AST 39 08/12/2024 1451   ALT 51 (H) 08/12/2024 1451   ALKPHOS 34 (L) 08/12/2024 1451   BILITOT 1.0 08/12/2024 1451    GFRNONAA >60 08/12/2024 1451   GFRAA >60 09/15/2015 1520   Lab Results  Component Value Date   WBC 2.8 (L) 08/27/2024   NEUTROABS 1.4 (L) 08/27/2024   HGB 15.0 08/27/2024   HCT 43.2 08/27/2024   MCV 86.4 08/27/2024   PLT 201 08/27/2024     Assessment/Plan Frontal glioblastoma (HCC)  Seizure disorder (HCC)  Kadan W Gentzler is clinically stable today, now having completed cycle #4 Temodar  and avastin .  Labs demonstrate modest neutropenia 2/2 TMZ.  We recommended continuing treatment with cycle #5 Temozolomide , 150 mg/m2, on for five days and off for twenty three days in twenty eight day cycles. The patient will have a complete blood count performed on days 21 and 28 of each cycle, and a comprehensive metabolic panel performed on day 28 of each cycle. Labs may need to be performed more often. Zofran  will prescribed for home use for nausea/vomiting.   Patient elected to proceed with avastin , dose reduced to 7.5mg /kg IV 2q weeks given recent minor bleeding.  Avastin  will be helpful as concurrent therapy given burden of enhancing tumor and steroid requirement.  We reviewed side effects of avastin , including hypertension, bleeding/clotting events, wound healing impairment.  The patient will have a complete blood count, a comprehensive metabolic panel, and urine protein performed prior to each avastin  infusion. Labs may need to be performed more often. Zofran  will prescribed for home use for nausea/vomiting.    Will defer cycle #5 an additional 2 weeks given relative neutropenia today.  Chemotherapy should be held for the following:  ANC less than 1,000  Platelets less than 100,000  LFT or creatinine greater than 2x ULN  If clinical concerns/contraindications develop   Avastin  should be held for the following:  ANC less than 500  Platelets less than 50,000  LFT or creatinine greater than 2x ULN  If clinical concerns/contraindications develop Informed consent was obtained  verbally at bedside to proceed with oral chemotherapy.  Decadron  should remain off if tolerated.  Recommended continuing reduced dose Eliquis  to 2.5mg  BID given minor oral bleeding.  We ask that [employer] return to clinic in 2 weeks prior to dosing cycle #5, or sooner as needed.  MRI brain will be requested for 09/06/24.  All questions were answered. The patient knows to call the clinic with any problems, questions or concerns. No barriers to learning were detected.  The total time spent in the encounter was 30 minutes and more than 50% was on counseling and review of test results   Arthea MARLA Manns, MD Medical Director of Neuro-Oncology [facility] 08/27/24 2:30 PM

## 2024-08-27 NOTE — Telephone Encounter (Signed)
 Name of Medication: Zolpidem  10mg  Name of Pharmacy: [id number] - [place], [place] - [address] AT [facility] OF [address]  Last Fill or Written Date and Quantity: 01/30/24 90tab 1refill Last Office Visit and Type: 04/30/24 for follow up Next Office Visit and Type: none Last Controlled Substance Agreement Date: none Last UDS: none

## 2024-08-27 NOTE — Progress Notes (Signed)
 Proceed with Mvasi  7.5mg /kg today per Dr. Buckley.  Colleen Donahoe, PharmD, MBA

## 2024-08-27 NOTE — Patient Instructions (Signed)
 [facility] - A DEPT OF [facility]  Discharge Instructions: Thank you for choosing [facility] to provide your oncology and hematology care.   If you have a lab appointment with the Cancer Center, please go directly to the Cancer Center and check in at the registration area.   Wear comfortable clothing and clothing appropriate for easy access to any Portacath or PICC line.   We strive to give you quality time with your provider. You may need to reschedule your appointment if you arrive late (15 or more minutes).  Arriving late affects you and other patients whose appointments are after yours.  Also, if you miss three or more appointments without notifying the office, you may be dismissed from the clinic at the provider's discretion.      For prescription refill requests, have your pharmacy contact our office and allow 72 hours for refills to be completed.    Today you received the following chemotherapy and/or immunotherapy agents Bevacizumab       To help prevent nausea and vomiting after your treatment, we encourage you to take your nausea medication as directed.  BELOW ARE SYMPTOMS THAT SHOULD BE REPORTED IMMEDIATELY: *FEVER GREATER THAN 100.4 F (38 C) OR HIGHER *CHILLS OR SWEATING *NAUSEA AND VOMITING THAT IS NOT CONTROLLED WITH YOUR NAUSEA MEDICATION *UNUSUAL SHORTNESS OF BREATH *UNUSUAL BRUISING OR BLEEDING *URINARY PROBLEMS (pain or burning when urinating, or frequent urination) *BOWEL PROBLEMS (unusual diarrhea, constipation, pain near the anus) TENDERNESS IN MOUTH AND THROAT WITH OR WITHOUT PRESENCE OF ULCERS (sore throat, sores in mouth, or a toothache) UNUSUAL RASH, SWELLING OR PAIN  UNUSUAL VAGINAL DISCHARGE OR ITCHING   Items with * indicate a potential emergency and should be followed up as soon as possible or go to the Emergency Department if any problems should occur.  Please show the CHEMOTHERAPY ALERT CARD or IMMUNOTHERAPY  ALERT CARD at check-in to the Emergency Department and triage nurse.  Should you have questions after your visit or need to cancel or reschedule your appointment, please contact [facility] - A DEPT OF [facility]  Dept: [phone]  and follow the prompts.  Office hours are 8:00 a.m. to 4:30 p.m. Monday - Friday. Please note that voicemails left after 4:00 p.m. may not be returned until the following business day.  We are closed weekends and major holidays. You have access to a nurse at all times for urgent questions. Please call the main number to the clinic Dept: [phone] and follow the prompts.   For any non-urgent questions, you may also contact your provider using MyChart. We now offer e-Visits for anyone 64 and older to request care online for non-urgent symptoms. For details visit [email or web address].   Also download the MyChart app! Go to the app store, search MyChart, open the app, select [place], and log in with your MyChart username and password.

## 2024-08-28 ENCOUNTER — Telehealth: Payer: Self-pay | Admitting: Internal Medicine

## 2024-08-28 ENCOUNTER — Encounter: Payer: Self-pay | Admitting: Internal Medicine

## 2024-08-28 NOTE — Telephone Encounter (Signed)
 Scheduled appointments per WQ. Talked with the patient and he is aware of the made appointments.

## 2024-08-29 ENCOUNTER — Other Ambulatory Visit: Payer: Self-pay

## 2024-09-01 ENCOUNTER — Other Ambulatory Visit: Payer: Self-pay | Admitting: Internal Medicine

## 2024-09-03 ENCOUNTER — Other Ambulatory Visit: Payer: Self-pay | Admitting: Internal Medicine

## 2024-09-03 ENCOUNTER — Encounter: Payer: Self-pay | Admitting: Internal Medicine

## 2024-09-06 ENCOUNTER — Other Ambulatory Visit: Payer: Self-pay | Admitting: Internal Medicine

## 2024-09-06 ENCOUNTER — Ambulatory Visit (HOSPITAL_COMMUNITY)
Admission: RE | Admit: 2024-09-06 | Discharge: 2024-09-06 | Disposition: A | Discharge status: Home / Self Care | Source: Ambulatory Visit | Attending: Internal Medicine | Admitting: Internal Medicine

## 2024-09-06 ENCOUNTER — Encounter: Payer: Self-pay | Admitting: Internal Medicine

## 2024-09-06 DIAGNOSIS — C711 Malignant neoplasm of frontal lobe: Secondary | ICD-10-CM | POA: Insufficient documentation

## 2024-09-06 MED ORDER — GADOBUTROL 1 MMOL/ML IV SOLN
10.0000 mL | Freq: Once | INTRAVENOUS | Status: AC | PRN
  Administered 2024-09-06: 10 mL via INTRAVENOUS

## 2024-09-09 ENCOUNTER — Inpatient Hospital Stay (HOSPITAL_BASED_OUTPATIENT_CLINIC_OR_DEPARTMENT_OTHER): Admitting: Internal Medicine

## 2024-09-09 ENCOUNTER — Other Ambulatory Visit: Payer: Self-pay | Admitting: *Deleted

## 2024-09-09 ENCOUNTER — Encounter: Payer: Self-pay | Admitting: Internal Medicine

## 2024-09-09 ENCOUNTER — Inpatient Hospital Stay

## 2024-09-09 VITALS — BP 125/92 | HR 80 | Temp 98.8°F | Resp 17 | Ht 74.0 in | Wt 284.0 lb

## 2024-09-09 DIAGNOSIS — C711 Malignant neoplasm of frontal lobe: Secondary | ICD-10-CM | POA: Diagnosis not present

## 2024-09-09 DIAGNOSIS — Z5112 Encounter for antineoplastic immunotherapy: Secondary | ICD-10-CM | POA: Diagnosis not present

## 2024-09-09 DIAGNOSIS — G40909 Epilepsy, unspecified, not intractable, without status epilepticus: Secondary | ICD-10-CM | POA: Diagnosis not present

## 2024-09-09 LAB — CBC WITH DIFFERENTIAL (CANCER CENTER ONLY)
Abs Immature Granulocytes: 0.01 K/uL (ref 0.00–0.07)
Basophils Absolute: 0 K/uL (ref 0.0–0.1)
Basophils Relative: 1 %
Eosinophils Absolute: 0.1 K/uL (ref 0.0–0.5)
Eosinophils Relative: 3 %
HCT: 41.6 % (ref 39.0–52.0)
Hemoglobin: 14.2 g/dL (ref 13.0–17.0)
Immature Granulocytes: 0 %
Lymphocytes Relative: 34 %
Lymphs Abs: 1.2 K/uL (ref 0.7–4.0)
MCH: 29.8 pg (ref 26.0–34.0)
MCHC: 34.1 g/dL (ref 30.0–36.0)
MCV: 87.2 fL (ref 80.0–100.0)
Monocytes Absolute: 0.3 K/uL (ref 0.1–1.0)
Monocytes Relative: 8 %
Neutro Abs: 1.9 K/uL (ref 1.7–7.7)
Neutrophils Relative %: 54 %
Platelet Count: 225 K/uL (ref 150–400)
RBC: 4.77 MIL/uL (ref 4.22–5.81)
RDW: 13.7 % (ref 11.5–15.5)
WBC Count: 3.5 K/uL — ABNORMAL LOW (ref 4.0–10.5)
nRBC: 0 % (ref 0.0–0.2)

## 2024-09-09 LAB — CMP (CANCER CENTER ONLY)
ALT: 29 U/L (ref 0–44)
AST: 28 U/L (ref 15–41)
Albumin: 4 g/dL (ref 3.5–5.0)
Alkaline Phosphatase: 36 U/L — ABNORMAL LOW (ref 38–126)
Anion gap: 6 (ref 5–15)
BUN: 7 mg/dL (ref 6–20)
CO2: 27 mmol/L (ref 22–32)
Calcium: 9.4 mg/dL (ref 8.9–10.3)
Chloride: 107 mmol/L (ref 98–111)
Creatinine: 0.94 mg/dL (ref 0.61–1.24)
GFR, Estimated: >60 mL/min (ref >60)
Glucose, Bld: 86 mg/dL (ref 70–99)
Potassium: 3.4 mmol/L — ABNORMAL LOW (ref 3.5–5.1)
Sodium: 140 mmol/L (ref 135–145)
Total Bilirubin: 0.7 mg/dL (ref 0.0–1.2)
Total Protein: 6.9 g/dL (ref 6.5–8.1)

## 2024-09-09 MED ORDER — SODIUM CHLORIDE 0.9 % IV SOLN
7.5000 mg/kg | Freq: Once | INTRAVENOUS | Status: AC
  Administered 2024-09-09: 1000 mg via INTRAVENOUS
  Filled 2024-09-09: qty 32

## 2024-09-09 MED ORDER — SODIUM CHLORIDE 0.9 % IV SOLN: INTRAVENOUS | Status: DC

## 2024-09-09 MED ORDER — TEMOZOLOMIDE 100 MG PO CAPS: 150.0000 mg/m2/d | ORAL_CAPSULE | Freq: Every day | ORAL | 0 refills | Status: DC

## 2024-09-09 NOTE — Patient Instructions (Signed)
 [facility] - A DEPT OF [facility]  Discharge Instructions: Thank you for choosing [facility] to provide your oncology and hematology care.   If you have a lab appointment with the Cancer Center, please go directly to the Cancer Center and check in at the registration area.   Wear comfortable clothing and clothing appropriate for easy access to any Portacath or PICC line.   We strive to give you quality time with your provider. You may need to reschedule your appointment if you arrive late (15 or more minutes).  Arriving late affects you and other patients whose appointments are after yours.  Also, if you miss three or more appointments without notifying the office, you may be dismissed from the clinic at the provider's discretion.      For prescription refill requests, have your pharmacy contact our office and allow 72 hours for refills to be completed.    Today you received the following chemotherapy and/or immunotherapy agents: bevacizumab      To help prevent nausea and vomiting after your treatment, we encourage you to take your nausea medication as directed.  BELOW ARE SYMPTOMS THAT SHOULD BE REPORTED IMMEDIATELY: *FEVER GREATER THAN 100.4 F (38 C) OR HIGHER *CHILLS OR SWEATING *NAUSEA AND VOMITING THAT IS NOT CONTROLLED WITH YOUR NAUSEA MEDICATION *UNUSUAL SHORTNESS OF BREATH *UNUSUAL BRUISING OR BLEEDING *URINARY PROBLEMS (pain or burning when urinating, or frequent urination) *BOWEL PROBLEMS (unusual diarrhea, constipation, pain near the anus) TENDERNESS IN MOUTH AND THROAT WITH OR WITHOUT PRESENCE OF ULCERS (sore throat, sores in mouth, or a toothache) UNUSUAL RASH, SWELLING OR PAIN  UNUSUAL VAGINAL DISCHARGE OR ITCHING   Items with * indicate a potential emergency and should be followed up as soon as possible or go to the Emergency Department if any problems should occur.  Please show the CHEMOTHERAPY ALERT CARD or  IMMUNOTHERAPY ALERT CARD at check-in to the Emergency Department and triage nurse.  Should you have questions after your visit or need to cancel or reschedule your appointment, please contact [facility] DE[facility]  Dept: [phone]  and follow the prompts.  Office hours are 8:00 a.m. to 4:30 p.m. Monday - Friday. Please note that voicemails left after 4:00 p.m. may not be returned until the following business day.  We are closed weekends and major holidays. You have access to a nurse at all times for urgent questions. Please call the main number to the clinic Dept: [phone] and follow the prompts.   For any non-urgent questions, you may also contact your provider using MyChart. We now offer e-Visits for anyone 81 and older to request care online for non-urgent symptoms. For details visit [email or web address].   Also download the MyChart app! Go to the app store, search "MyChart", open the app, select [place], and log in with your MyChart username and password.

## 2024-09-09 NOTE — Progress Notes (Signed)
 [facility] [address]  [place], [place] [address] [phone]   Interval Evaluation  Date of Service: 09/09/24 Patient Name: Nicholas Stevens Patient MRN: [id number] Patient DOB: [birth year] Provider: Arthea MARLA Manns, MD  Identifying Statement:  Nicholas Stevens is a 53 y.o. male with right frontal glioblastoma    Oncologic History: Oncology History  Frontal glioblastoma (HCC)  12/18/2023 Surgery   Craniotomy, resection of right frontal mass with Dr. Debby; path is glioblastoma, IDH pending   01/22/2024 - 01/22/2024 Chemotherapy   Patient is on Treatment Plan : BRAIN GLIOBLASTOMA Radiation Therapy With Concurrent Temozolomide  75 mg/m2 Daily Followed By Sequential Maintenance Temozolomide  x 6-12 cycles     06/11/2024 -  Chemotherapy   Patient is on Treatment Plan : BRAIN Temozolomide  D1-5 + Bevacizumab  (10) D1,15 q28d         Interval History: Nicholas Stevens presents today having completed cycle #4 5-day TMZ with concurrent avastin , also recent MRI brain.  Continues to do well with treatment overall.  He remains off decadron .  No new or progressive changes.  Denies headaches or seizures.  Prior- presents for follow up after recent clinical changes.  He describes relatively rapid onset of left sided weakness starting ~10 days ago.  He was unable to walk and use the left arm meaningfully.  No seizure activity appreciated.  Decadron  was started 4mg  twice per day after 3-4 days of the weakness.  Once the steroid was started, he returned back to his prior baseline.  Continues on the Keppra .  Denies severe headaches.   H+P (01/08/24) Patient presented with several days of left sided clumsiness, impaired coordination.  Was found to have large right frontal mass, c/w primary brain tumor.  He underwent craniotomy, resection with Dr. Debby on 12/18/23; path demonstrated glioblastoma.  Following surgery, he had persistent right sided  weakness, he is currently in outpatient PT following discharge from rehab.  No seizures, headaches.     Medications: Current Outpatient Medications on File Prior to Visit  Medication Sig Dispense Refill   apixaban  (ELIQUIS ) 2.5 MG TABS tablet Take 1 tablet (2.5 mg total) by mouth 2 (two) times daily. 60 tablet 2   Baclofen  5 MG TABS Take 1 tablet (5 mg total) by mouth every 6 (six) hours as needed (hiccups). 90 tablet 1   camphor-menthol  (SARNA) lotion Apply topically as needed for itching. 222 mL 0   chlorhexidine  (PERIDEX ) 0.12 % solution Use as directed 15 mLs in the mouth or throat 3 (three) times daily.     Cyanocobalamin  (B-12 PO) Take 1 tablet by mouth daily.     fluconazole  (DIFLUCAN ) 100 MG tablet Take 2 tabs on day#1, then 1 tab daily on Days #2-10 11 tablet 1   furosemide  (LASIX ) 20 MG tablet Take 1 tablet (20 mg total) by mouth daily as needed. 30 tablet 0   latanoprost  (XALATAN ) 0.005 % ophthalmic solution INSTILL 1 DROP IN BOTH EYES AT BEDTIME 2.5 mL 2   levETIRAcetam  (KEPPRA ) 500 MG tablet Take 2 tablets (1,000 mg total) by mouth 2 (two) times daily. 120 tablet 2   magic mouthwash (nystatin , lidocaine , diphenhydrAMINE , alum & mag hydroxide) suspension Swish and swallow 5 mLs by mouth 4 (four) times daily as needed for mouth pain. 140 mL 1   Multiple Vitamin (MULTIVITAMIN WITH MINERALS) TABS tablet Take 1 tablet by mouth daily.     olmesartan  (BENICAR ) 20 MG tablet Take 1 tablet (20 mg total) by  mouth daily. 90 tablet 3   Omega-3 Fatty Acids (FISH OIL) 1000 MG CAPS Take 1,000 mg by mouth daily.     pantoprazole  (PROTONIX ) 40 MG tablet Take 1 tablet (40 mg total) by mouth 2 (two) times daily. 180 tablet 3   pimecrolimus  (ELIDEL ) 1 % cream Apply 1 Application topically 2 (two) times daily as needed (Dermatitis).     senna-docusate (SENOKOT-S) 8.6-50 MG tablet Take 2 tablets by mouth daily at 6 (six) AM. 60 tablet 0   temozolomide  (TEMODAR ) 100 MG capsule Take 2 capsules (200 mg  total) by mouth daily. (Take with one 180 mg capsule for total daily dose of 380 mg). Take for 5 days on, 23 days off. Repeat every 28 days. May take on an empty stomach to decrease nausea & vomiting. 10 capsule 0   temozolomide  (TEMODAR ) 100 MG capsule Take 1 capsule (100 mg total) by mouth daily. Take for 5 days on, 23 days off. Repeat every 28 days. Take on an empty stomach 1 hour before or 2 hours after a meal. 5 capsule 0   temozolomide  (TEMODAR ) 140 MG capsule Take 2 capsules (280 mg total) by mouth daily. Take for 5 days on, 23 days off. Repeat every 28 days. Take on an empty stomach 1 hour before or 2 hours after a meal. 10 capsule 0   temozolomide  (TEMODAR ) 180 MG capsule Take 1 capsule (180 mg total) by mouth daily. (Take with two 100 mg capsules for total daily dose of 380 mg). Take for 5 days on, 23 days off. Repeat every 28 days. May take on an empty stomach to decrease nausea & vomiting. 5 capsule 0   triamcinolone  cream (KENALOG ) 0.1 % Apply 1 Application topically daily. Use on affected areas for 2 weeks, then stop for 2 weeks. Repeat until clear if needed. 453 g 0   triamterene -hydrochlorothiazide  (MAXZIDE -25) 37.5-25 MG tablet Take 1 tablet by mouth daily. 90 tablet 3   zolpidem  (AMBIEN ) 10 MG tablet TAKE 1 TABLET BY MOUTH EVERYDAY AT BEDTIME 90 tablet 1   No current facility-administered medications on file prior to visit.    Allergies: No Known Allergies Past Medical History:  Past Medical History:  Diagnosis Date   DDD (degenerative disc disease), lumbar    HNP (herniated nucleus pulposus)    Hypertension    Liver hemangioma    Morbid obesity (HCC)    OSA (obstructive sleep apnea)    no cpap used since weight loss   Overweight(278.02)    Plantar fasciitis of right foot    Sleep apnea    Tinea barbae    Past Surgical History:  Past Surgical History:  Procedure Laterality Date   BREATH TEK H PYLORI N/A 08/20/2013   Procedure: BREATH TEK H PYLORI;  Surgeon: Donnice KATHEE Lunger, MD;  Location: [facility];  Service: General;  Laterality: N/A;   COLONOSCOPY WITH PROPOFOL  N/A 05/05/2022   Procedure: COLONOSCOPY WITH PROPOFOL ;  Surgeon: Albertus Gordy HERO, MD;  Location: [facility];  Service: Gastroenterology;  Laterality: N/A;   CRANIOTOMY Right 12/18/2023   Procedure: Right Frontal Stereotactic Craniotomy for Resection of Tumor;  Surgeon: Debby Dorn MATSU, MD;  Location: [facility] OR;  Service: Neurosurgery;  Laterality: Right;   ESOPHAGOGASTRODUODENOSCOPY (EGD) WITH PROPOFOL  N/A 03/15/2023   Procedure: ESOPHAGOGASTRODUODENOSCOPY (EGD) WITH PROPOFOL ;  Surgeon: Avram Lupita BRAVO, MD;  Location: [facility];  Service: Gastroenterology;  Laterality: N/A;   LAPAROSCOPIC APPENDECTOMY  2007   Dr Curvin   LAPAROSCOPIC GASTRIC SLEEVE RESECTION  N/A 10/08/2013   Procedure: LAPAROSCOPIC SLEEVE GASTRECTOMY;  Surgeon: Donnice KATHEE Lunger, MD;  Location: [facility];  Service: General;  Laterality: N/A;   LUMBAR LAMINECTOMY/DECOMPRESSION MICRODISCECTOMY N/A 09/16/2015   Procedure: MICRO LUMBAR DECOMPRESSION, MICRODISCECTOMY L5 - S1;  Surgeon: Reyes Billing, MD;  Location: [facility];  Service: Orthopedics;  Laterality: N/A;   POLYPECTOMY  05/05/2022   Procedure: POLYPECTOMY;  Surgeon: Albertus Gordy HERO, MD;  Location: [facility];  Service: Gastroenterology;;   ROTATOR CUFF REPAIR Right 2005   Dr Harden   Social History:  Social History   Socioeconomic History   Marital status: Married    Spouse name: Victorio Creeden   Number of children: 3   Years of education: Not on file   Highest education level: 12th grade  Occupational History   Occupation: Doctor, general practice PD    Employer: UNEMPLOYED  Tobacco Use   Smoking status: Never    Passive exposure: Never   Smokeless tobacco: Never  Vaping Use   Vaping status: Never Used  Substance and Sexual Activity   Alcohol  use: No   Drug use: No   Sexual activity: Not on file  Other Topics Concern   Not on file  Social History Narrative   Married 18+ years   3  children - ages approx 76, 9 and 5...daughter age 29 w/ DM type 1   Social Drivers of Corporate investment banker Strain: Low Risk  (04/02/2023)   Overall Financial Resource Strain (CARDIA)    Difficulty of Paying Living Expenses: Not hard at all  Food Insecurity: No Food Insecurity (12/18/2023)   Hunger Vital Sign    Worried About Running Out of Food in the Last Year: Never true    Ran Out of Food in the Last Year: Never true  Transportation Needs: No Transportation Needs (12/18/2023)   PRAPARE - Administrator, Civil Service (Medical): No    Lack of Transportation (Non-Medical): No  Physical Activity: Insufficiently Active (04/02/2023)   Exercise Vital Sign    Days of Exercise per Week: 4 days    Minutes of Exercise per Session: 30 min  Stress: No Stress Concern Present (04/02/2023)   [employer] of Occupational Health - Occupational Stress Questionnaire    Feeling of Stress : Not at all  Social Connections: Socially Integrated (04/02/2023)   Social Connection and Isolation Panel    Frequency of Communication with Friends and Family: More than three times a week    Frequency of Social Gatherings with Friends and Family: Once a week    Attends Religious Services: More than 4 times per year    Active Member of [employer] or Organizations: Yes    Attends Engineer, structural: More than 4 times per year    Marital Status: Married  Catering manager Violence: Not At Risk (12/18/2023)   Humiliation, Afraid, Rape, and Kick questionnaire    Fear of Current or Ex-Partner: No    Emotionally Abused: No    Physically Abused: No    Sexually Abused: No   Family History:  Family History  Problem Relation Age of Onset   Other Mother        hx of DJD   Liver disease Father        ETOH, Hep C   Hypertension Father    Colon cancer Neg Hx    Colon polyps Neg Hx    Esophageal cancer Neg Hx    Rectal cancer Neg Hx    Stomach cancer  Neg Hx     Review of  Systems: Constitutional: Doesn't report fevers, chills or abnormal weight loss Eyes: Doesn't report blurriness of vision Ears, nose, mouth, throat, and face: Doesn't report sore throat Respiratory: Doesn't report cough, dyspnea or wheezes Cardiovascular: Doesn't report palpitation, chest discomfort  Gastrointestinal:  Doesn't report nausea, constipation, diarrhea GU: Doesn't report incontinence Skin: Doesn't report skin rashes Neurological: Per HPI Musculoskeletal: Doesn't report joint pain Behavioral/Psych: Doesn't report anxiety  Physical Exam: Vitals:   09/09/24 1416  BP: (!) 125/92  Pulse: 80  Resp: 17  Temp: 98.8 F (37.1 C)  SpO2: 100%   KPS: 70. General: Alert, cooperative, pleasant, in no acute distress Head: Normal EENT: No conjunctival injection or scleral icterus.  Lungs: Resp effort normal Cardiac: Regular rate Abdomen: Non-distended abdomen Skin: No rashes cyanosis or petechiae. Extremities: No clubbing or edema  Neurologic Exam: Mental Status: Awake, alert, attentive to examiner. Oriented to self and environment. Language is fluent with intact comprehension.  Cranial Nerves: Visual acuity is grossly normal. Visual fields are full. Extra-ocular movements intact. No ptosis. Face is symmetric Motor: Tone and bulk are normal. Power is 4+/5 in left arm and 4/5 leg. Reflexes are symmetric, no pathologic reflexes present.  Sensory: Intact to light touch Gait: Hemiparetic   Labs: I have reviewed the data as listed    Component Value Date/Time   NA 138 08/12/2024 1451   K 3.1 (L) 08/12/2024 1451   CL 105 08/12/2024 1451   CO2 26 08/12/2024 1451   GLUCOSE 126 (H) 08/12/2024 1451   BUN 11 08/12/2024 1451   CREATININE 1.03 08/12/2024 1451   CALCIUM 9.4 08/12/2024 1451   PROT 6.9 08/12/2024 1451   ALBUMIN 4.2 08/12/2024 1451   AST 39 08/12/2024 1451   ALT 51 (H) 08/12/2024 1451   ALKPHOS 34 (L) 08/12/2024 1451   BILITOT 1.0 08/12/2024 1451   GFRNONAA >60  08/12/2024 1451   GFRAA >60 09/15/2015 1520   Lab Results  Component Value Date   WBC 2.8 (L) 08/27/2024   NEUTROABS 1.4 (L) 08/27/2024   HGB 15.0 08/27/2024   HCT 43.2 08/27/2024   MCV 86.4 08/27/2024   PLT 201 08/27/2024    Imaging:  [facility] Clinician Interpretation: I have personally reviewed the CNS images as listed.  My interpretation, in the context of the patient's clinical presentation, is stable disease  MR BRAIN W WO CONTRAST Result Date: 09/06/2024 EXAM: MRI BRAIN WITH AND WITHOUT CONTRAST 09/06/2024 10:06:10 AM TECHNIQUE: Multiplanar multisequence MRI of the head/brain was performed with and without the administration of intravenous contrast. COMPARISON: MRI head 07/02/2024 CLINICAL HISTORY: Brain/CNS neoplasm, assess treatment response. Brain/CNS neoplasm, assess treatment response, Frontal glioblastoma. FINDINGS: BRAIN AND VENTRICLES: Postsurgical changes of right frontoparietal craniotomy redemonstrated. Resection cavity centered in the right superior frontal gyrus partially extending into the right cingulate gyrus. The resection cavity measures 1.7 x 3.2 x 3.4 cm, previously measuring 1.9 x 3.0 x 3.4 cm. There is decreased curvilinear enhancement along the margin of the resection cavity. There has been further interval decrease in edema surrounding the resection cavity. Edema along the lateral and posterolateral aspects within the centrum semiovale are significantly decreased. There is also slightly decreased edema within the anterior right frontal lobe and right insular cortex. Susceptibility along the margin of the resection cavity is similar to slightly increased. Similar interval restricted diffusion suggestive of necrotic/devitalized tissue. Several areas of heterogeneous intrinsic T1 hyperintensity within the resection cavity. No evidence of acute infarct. No midline shift. ORBITS:  No acute abnormality. SINUSES: No acute abnormality. BONES AND SOFT TISSUES: Normal bone marrow  signal and enhancement. No acute soft tissue abnormality. CONTRAST: 10 mL of gadavist  given. IMPRESSION: 1. Interval decrease in curvilinear enhancement along the margin of the resection cavity in the right frontal lobe with decreased edema as described above. 2. No new intracranial lesions. Electronically signed by: Donnice Mania MD 09/06/2024 11:34 AM EDT RP Workstation: [id number]    Assessment/Plan Frontal glioblastoma (HCC)  Seizure disorder (HCC)  Nicholas Stevens is clinically stable today, now having completed cycle #4 Temodar  and avastin .  MRI demonstrates stable findings.  Relative neutropenia has improved on CBC.  We recommended continuing treatment with cycle #5 Temozolomide , 150 mg/m2, on for five days and off for twenty three days in twenty eight day cycles. The patient will have a complete blood count performed on days 21 and 28 of each cycle, and a comprehensive metabolic panel performed on day 28 of each cycle. Labs may need to be performed more often. Zofran  will prescribed for home use for nausea/vomiting.   Patient elected to proceed with avastin , dose reduced to 7.5mg /kg IV 2q weeks given recent minor bleeding.  Avastin  will be helpful as concurrent therapy given burden of enhancing tumor and steroid requirement.  We reviewed side effects of avastin , including hypertension, bleeding/clotting events, wound healing impairment.  The patient will have a complete blood count, a comprehensive metabolic panel, and urine protein performed prior to each avastin  infusion. Labs may need to be performed more often. Zofran  will prescribed for home use for nausea/vomiting.   Chemotherapy should be held for the following:  ANC less than 1,000  Platelets less than 100,000  LFT or creatinine greater than 2x ULN  If clinical concerns/contraindications develop   Avastin  should be held for the following:  ANC less than 500  Platelets less than 50,000  LFT or creatinine greater than  2x ULN  If clinical concerns/contraindications develop Informed consent was obtained verbally at bedside to proceed with oral chemotherapy.  Decadron  should remain off if tolerated.  Recommended continuing reduced dose Eliquis  to 2.5mg  BID given minor oral bleeding.  We ask that [employer] return to clinic in 2 weeks prior to dosing cycle #6, or sooner as needed.    All questions were answered. The patient knows to call the clinic with any problems, questions or concerns. No barriers to learning were detected.  The total time spent in the encounter was 40 minutes and more than 50% was on counseling and review of test results   Arthea MARLA Manns, MD Medical Director of Neuro-Oncology [facility] at [facility] 09/09/24 2:14 PM

## 2024-09-10 ENCOUNTER — Ambulatory Visit: Admitting: Internal Medicine

## 2024-09-10 ENCOUNTER — Other Ambulatory Visit

## 2024-09-10 ENCOUNTER — Ambulatory Visit

## 2024-09-16 ENCOUNTER — Telehealth: Payer: Self-pay | Admitting: Internal Medicine

## 2024-09-16 NOTE — Telephone Encounter (Signed)
 Scheduled appointments per WQ. Called and left VM with the appointment details for the patient.

## 2024-09-17 ENCOUNTER — Other Ambulatory Visit: Payer: Self-pay

## 2024-09-18 ENCOUNTER — Encounter: Payer: Self-pay | Admitting: Internal Medicine

## 2024-09-23 ENCOUNTER — Other Ambulatory Visit: Payer: Self-pay | Admitting: *Deleted

## 2024-09-23 ENCOUNTER — Inpatient Hospital Stay

## 2024-09-23 ENCOUNTER — Inpatient Hospital Stay: Admitting: Internal Medicine

## 2024-09-23 VITALS — BP 128/91 | HR 71 | Temp 97.2°F | Resp 17 | Ht 74.0 in | Wt 278.0 lb

## 2024-09-23 DIAGNOSIS — C711 Malignant neoplasm of frontal lobe: Secondary | ICD-10-CM

## 2024-09-23 DIAGNOSIS — Z5112 Encounter for antineoplastic immunotherapy: Secondary | ICD-10-CM | POA: Diagnosis not present

## 2024-09-23 DIAGNOSIS — G40909 Epilepsy, unspecified, not intractable, without status epilepticus: Secondary | ICD-10-CM | POA: Diagnosis not present

## 2024-09-23 LAB — CBC WITH DIFFERENTIAL (CANCER CENTER ONLY)
Abs Immature Granulocytes: 0 K/uL (ref 0.00–0.07)
Basophils Absolute: 0 K/uL (ref 0.0–0.1)
Basophils Relative: 1 %
Eosinophils Absolute: 0.1 K/uL (ref 0.0–0.5)
Eosinophils Relative: 3 %
HCT: 42.1 % (ref 39.0–52.0)
Hemoglobin: 14.5 g/dL (ref 13.0–17.0)
Immature Granulocytes: 0 %
Lymphocytes Relative: 30 %
Lymphs Abs: 1.1 K/uL (ref 0.7–4.0)
MCH: 30.1 pg (ref 26.0–34.0)
MCHC: 34.4 g/dL (ref 30.0–36.0)
MCV: 87.3 fL (ref 80.0–100.0)
Monocytes Absolute: 0.4 K/uL (ref 0.1–1.0)
Monocytes Relative: 10 %
Neutro Abs: 2 K/uL (ref 1.7–7.7)
Neutrophils Relative %: 56 %
Platelet Count: 228 K/uL (ref 150–400)
RBC: 4.82 MIL/uL (ref 4.22–5.81)
RDW: 13.4 % (ref 11.5–15.5)
WBC Count: 3.6 K/uL — ABNORMAL LOW (ref 4.0–10.5)
nRBC: 0 % (ref 0.0–0.2)

## 2024-09-23 LAB — CMP (CANCER CENTER ONLY)
ALT: 49 U/L — ABNORMAL HIGH (ref 0–44)
AST: 37 U/L (ref 15–41)
Albumin: 4.3 g/dL (ref 3.5–5.0)
Alkaline Phosphatase: 40 U/L (ref 38–126)
Anion gap: 7 (ref 5–15)
BUN: 11 mg/dL (ref 6–20)
CO2: 26 mmol/L (ref 22–32)
Calcium: 9.7 mg/dL (ref 8.9–10.3)
Chloride: 105 mmol/L (ref 98–111)
Creatinine: 0.96 mg/dL (ref 0.61–1.24)
GFR, Estimated: >60 mL/min (ref >60)
Glucose, Bld: 108 mg/dL — ABNORMAL HIGH (ref 70–99)
Potassium: 3.5 mmol/L (ref 3.5–5.1)
Sodium: 138 mmol/L (ref 135–145)
Total Bilirubin: 0.7 mg/dL (ref 0.0–1.2)
Total Protein: 7.4 g/dL (ref 6.5–8.1)

## 2024-09-23 LAB — TOTAL PROTEIN, URINE DIPSTICK: Protein, ur: NEGATIVE mg/dL

## 2024-09-23 MED ORDER — SODIUM CHLORIDE 0.9 % IV SOLN: INTRAVENOUS | Status: DC

## 2024-09-23 MED ORDER — SODIUM CHLORIDE 0.9 % IV SOLN
7.5000 mg/kg | Freq: Once | INTRAVENOUS | Status: AC
  Administered 2024-09-23: 1000 mg via INTRAVENOUS
  Filled 2024-09-23: qty 32

## 2024-09-23 NOTE — Progress Notes (Signed)
 [facility] at [facility] [address]  [place], [place] [address] [phone]   Interval Evaluation  Date of Service: 09/23/24 Patient Name: Nicholas Stevens Patient MRN: [id number] Patient DOB: Oct 17, 1971 Provider: Arthea MARLA Manns, MD  Identifying Statement:  Nicholas Stevens is a 53 y.o. male with right frontal glioblastoma    Oncologic History: Oncology History  Frontal glioblastoma (HCC)  12/18/2023 Surgery   Craniotomy, resection of right frontal mass with Dr. Debby; path is glioblastoma, IDH pending   01/22/2024 - 01/22/2024 Chemotherapy   Patient is on Treatment Plan : BRAIN GLIOBLASTOMA Radiation Therapy With Concurrent Temozolomide  75 mg/m2 Daily Followed By Sequential Maintenance Temozolomide  x 6-12 cycles     06/11/2024 -  Chemotherapy   Patient is on Treatment Plan : BRAIN Temozolomide  D1-5 + Bevacizumab  (10) D1,15 q28d         Interval History: Nicholas Stevens presents today having dosed cycle #5 5-day TMZ (09/16/24) with concurrent avastin , also recent MRI brain.  Continues to do well with treatment overall.  He remains off decadron .  Denies new or progressive changes today.  Denies headaches or seizures.  Prior- presents for follow up after recent clinical changes.  He describes relatively rapid onset of left sided weakness starting ~10 days ago.  He was unable to walk and use the left arm meaningfully.  No seizure activity appreciated.  Decadron  was started 4mg  twice per day after 3-4 days of the weakness.  Once the steroid was started, he returned back to his prior baseline.  Continues on the Keppra .  Denies severe headaches.   H+P (01/08/24) Patient presented with several days of left sided clumsiness, impaired coordination.  Was found to have large right frontal mass, c/w primary brain tumor.  He underwent craniotomy, resection with Dr. Debby on 12/18/23; path demonstrated glioblastoma.  Following surgery, he had persistent  right sided weakness, he is currently in outpatient PT following discharge from rehab.  No seizures, headaches.     Medications: Current Outpatient Medications on File Prior to Visit  Medication Sig Dispense Refill   apixaban  (ELIQUIS ) 2.5 MG TABS tablet Take 1 tablet (2.5 mg total) by mouth 2 (two) times daily. 60 tablet 2   Baclofen  5 MG TABS Take 1 tablet (5 mg total) by mouth every 6 (six) hours as needed (hiccups). 90 tablet 1   camphor-menthol  (SARNA) lotion Apply topically as needed for itching. 222 mL 0   chlorhexidine  (PERIDEX ) 0.12 % solution Use as directed 15 mLs in the mouth or throat 3 (three) times daily.     Cyanocobalamin  (B-12 PO) Take 1 tablet by mouth daily.     fluconazole  (DIFLUCAN ) 100 MG tablet Take 2 tabs on day#1, then 1 tab daily on Days #2-10 11 tablet 1   furosemide  (LASIX ) 20 MG tablet Take 1 tablet (20 mg total) by mouth daily as needed. 30 tablet 0   latanoprost  (XALATAN ) 0.005 % ophthalmic solution INSTILL 1 DROP IN BOTH EYES AT BEDTIME 2.5 mL 2   levETIRAcetam  (KEPPRA ) 500 MG tablet Take 2 tablets (1,000 mg total) by mouth 2 (two) times daily. 120 tablet 2   magic mouthwash (nystatin , lidocaine , diphenhydrAMINE , alum & mag hydroxide) suspension Swish and swallow 5 mLs by mouth 4 (four) times daily as needed for mouth pain. 140 mL 1   Multiple Vitamin (MULTIVITAMIN WITH MINERALS) TABS tablet Take 1 tablet by mouth daily.     olmesartan  (BENICAR ) 20 MG tablet Take 1 tablet (20 mg  total) by mouth daily. 90 tablet 3   Omega-3 Fatty Acids (FISH OIL) 1000 MG CAPS Take 1,000 mg by mouth daily.     pantoprazole  (PROTONIX ) 40 MG tablet Take 1 tablet (40 mg total) by mouth 2 (two) times daily. 180 tablet 3   pimecrolimus  (ELIDEL ) 1 % cream Apply 1 Application topically 2 (two) times daily as needed (Dermatitis).     senna-docusate (SENOKOT-S) 8.6-50 MG tablet Take 2 tablets by mouth daily at 6 (six) AM. 60 tablet 0   temozolomide  (TEMODAR ) 100 MG capsule Take 2 capsules  (200 mg total) by mouth daily. (Take with one 180 mg capsule for total daily dose of 380 mg). Take for 5 days on, 23 days off. Repeat every 28 days. May take on an empty stomach to decrease nausea & vomiting. 10 capsule 0   temozolomide  (TEMODAR ) 100 MG capsule Take 1 capsule (100 mg total) by mouth daily. Take for 5 days on, 23 days off. Repeat every 28 days. Take on an empty stomach 1 hour before or 2 hours after a meal. 5 capsule 0   temozolomide  (TEMODAR ) 100 MG capsule Take 4 capsules (400 mg total) by mouth daily. Take for 5 days on, 23 days off. Repeat every 28 days. Take on an empty stomach 1 hour before or 2 hours after a meal. 20 capsule 0   temozolomide  (TEMODAR ) 140 MG capsule Take 2 capsules (280 mg total) by mouth daily. Take for 5 days on, 23 days off. Repeat every 28 days. Take on an empty stomach 1 hour before or 2 hours after a meal. 10 capsule 0   temozolomide  (TEMODAR ) 180 MG capsule Take 1 capsule (180 mg total) by mouth daily. (Take with two 100 mg capsules for total daily dose of 380 mg). Take for 5 days on, 23 days off. Repeat every 28 days. May take on an empty stomach to decrease nausea & vomiting. 5 capsule 0   triamcinolone  cream (KENALOG ) 0.1 % Apply 1 Application topically daily. Use on affected areas for 2 weeks, then stop for 2 weeks. Repeat until clear if needed. 453 g 0   triamterene -hydrochlorothiazide  (MAXZIDE -25) 37.5-25 MG tablet Take 1 tablet by mouth daily. 90 tablet 3   zolpidem  (AMBIEN ) 10 MG tablet TAKE 1 TABLET BY MOUTH EVERYDAY AT BEDTIME 90 tablet 1   No current facility-administered medications on file prior to visit.    Allergies: No Known Allergies Past Medical History:  Past Medical History:  Diagnosis Date   DDD (degenerative disc disease), lumbar    HNP (herniated nucleus pulposus)    Hypertension    Liver hemangioma    Morbid obesity (HCC)    OSA (obstructive sleep apnea)    no cpap used since weight loss   Overweight(278.02)    Plantar  fasciitis of right foot    Sleep apnea    Tinea barbae    Past Surgical History:  Past Surgical History:  Procedure Laterality Date   BREATH TEK H PYLORI N/A 08/20/2013   Procedure: BREATH TEK H PYLORI;  Surgeon: Donnice KATHEE Lunger, MD;  Location: [facility];  Service: General;  Laterality: N/A;   COLONOSCOPY WITH PROPOFOL  N/A 05/05/2022   Procedure: COLONOSCOPY WITH PROPOFOL ;  Surgeon: Albertus Gordy HERO, MD;  Location: [facility];  Service: Gastroenterology;  Laterality: N/A;   CRANIOTOMY Right 12/18/2023   Procedure: Right Frontal Stereotactic Craniotomy for Resection of Tumor;  Surgeon: Debby Dorn MATSU, MD;  Location: [facility];  Service: Neurosurgery;  Laterality: Right;  ESOPHAGOGASTRODUODENOSCOPY (EGD) WITH PROPOFOL  N/A 03/15/2023   Procedure: ESOPHAGOGASTRODUODENOSCOPY (EGD) WITH PROPOFOL ;  Surgeon: Avram Lupita BRAVO, MD;  Location: [facility];  Service: Gastroenterology;  Laterality: N/A;   LAPAROSCOPIC APPENDECTOMY  2007   Dr Curvin   LAPAROSCOPIC GASTRIC SLEEVE RESECTION N/A 10/08/2013   Procedure: LAPAROSCOPIC SLEEVE GASTRECTOMY;  Surgeon: Donnice KATHEE Lunger, MD;  Location: [facility];  Service: General;  Laterality: N/A;   LUMBAR LAMINECTOMY/DECOMPRESSION MICRODISCECTOMY N/A 09/16/2015   Procedure: MICRO LUMBAR DECOMPRESSION, MICRODISCECTOMY L5 - S1;  Surgeon: Reyes Billing, MD;  Location: [facility];  Service: Orthopedics;  Laterality: N/A;   POLYPECTOMY  05/05/2022   Procedure: POLYPECTOMY;  Surgeon: Albertus Gordy HERO, MD;  Location: [facility];  Service: Gastroenterology;;   ROTATOR CUFF REPAIR Right 2005   Dr Harden   Social History:  Social History   Socioeconomic History   Marital status: Married    Spouse name: Littleton Haub   Number of children: 3   Years of education: Not on file   Highest education level: 12th grade  Occupational History   Occupation: Doctor, general practice PD    Employer: UNEMPLOYED  Tobacco Use   Smoking status: Never    Passive exposure: Never   Smokeless tobacco: Never   Vaping Use   Vaping status: Never Used  Substance and Sexual Activity   Alcohol  use: No   Drug use: No   Sexual activity: Not on file  Other Topics Concern   Not on file  Social History Narrative   Married 18+ years   3 children - ages approx 63, 9 and 5...daughter age 33 w/ DM type 1   Social Drivers of Corporate investment banker Strain: Low Risk  (04/02/2023)   Overall Financial Resource Strain (CARDIA)    Difficulty of Paying Living Expenses: Not hard at all  Food Insecurity: No Food Insecurity (12/18/2023)   Hunger Vital Sign    Worried About Running Out of Food in the Last Year: Never true    Ran Out of Food in the Last Year: Never true  Transportation Needs: No Transportation Needs (12/18/2023)   PRAPARE - Administrator, Civil Service (Medical): No    Lack of Transportation (Non-Medical): No  Physical Activity: Insufficiently Active (04/02/2023)   Exercise Vital Sign    Days of Exercise per Week: 4 days    Minutes of Exercise per Session: 30 min  Stress: No Stress Concern Present (04/02/2023)   [employer] of Occupational Health - Occupational Stress Questionnaire    Feeling of Stress : Not at all  Social Connections: Socially Integrated (04/02/2023)   Social Connection and Isolation Panel    Frequency of Communication with Friends and Family: More than three times a week    Frequency of Social Gatherings with Friends and Family: Once a week    Attends Religious Services: More than 4 times per year    Active Member of [employer] or Organizations: Yes    Attends Engineer, structural: More than 4 times per year    Marital Status: Married  Catering manager Violence: Not At Risk (12/18/2023)   Humiliation, Afraid, Rape, and Kick questionnaire    Fear of Current or Ex-Partner: No    Emotionally Abused: No    Physically Abused: No    Sexually Abused: No   Family History:  Family History  Problem Relation Age of Onset   Other Mother        hx of DJD    Liver disease Father  ETOH, Hep C   Hypertension Father    Colon cancer Neg Hx    Colon polyps Neg Hx    Esophageal cancer Neg Hx    Rectal cancer Neg Hx    Stomach cancer Neg Hx     Review of Systems: Constitutional: Doesn't report fevers, chills or abnormal weight loss Eyes: Doesn't report blurriness of vision Ears, nose, mouth, throat, and face: Doesn't report sore throat Respiratory: Doesn't report cough, dyspnea or wheezes Cardiovascular: Doesn't report palpitation, chest discomfort  Gastrointestinal:  Doesn't report nausea, constipation, diarrhea GU: Doesn't report incontinence Skin: Doesn't report skin rashes Neurological: Per HPI Musculoskeletal: Doesn't report joint pain Behavioral/Psych: Doesn't report anxiety  Physical Exam: Vitals:   09/23/24 1427  BP: (!) 128/91  Pulse: 71  Resp: 17  Temp: (!) 97.2 F (36.2 C)  SpO2: 100%    KPS: 70. General: Alert, cooperative, pleasant, in no acute distress Head: Normal EENT: No conjunctival injection or scleral icterus.  Lungs: Resp effort normal Cardiac: Regular rate Abdomen: Non-distended abdomen Skin: No rashes cyanosis or petechiae. Extremities: No clubbing or edema  Neurologic Exam: Mental Status: Awake, alert, attentive to examiner. Oriented to self and environment. Language is fluent with intact comprehension.  Cranial Nerves: Visual acuity is grossly normal. Visual fields are full. Extra-ocular movements intact. No ptosis. Face is symmetric Motor: Tone and bulk are normal. Power is 4+/5 in left arm and 4/5 leg. Reflexes are symmetric, no pathologic reflexes present.  Sensory: Intact to light touch Gait: Hemiparetic   Labs: I have reviewed the data as listed    Component Value Date/Time   NA 140 09/09/2024 1402   K 3.4 (L) 09/09/2024 1402   CL 107 09/09/2024 1402   CO2 27 09/09/2024 1402   GLUCOSE 86 09/09/2024 1402   BUN 7 09/09/2024 1402   CREATININE 0.94 09/09/2024 1402   CALCIUM 9.4  09/09/2024 1402   PROT 6.9 09/09/2024 1402   ALBUMIN 4.0 09/09/2024 1402   AST 28 09/09/2024 1402   ALT 29 09/09/2024 1402   ALKPHOS 36 (L) 09/09/2024 1402   BILITOT 0.7 09/09/2024 1402   GFRNONAA >60 09/09/2024 1402   GFRAA >60 09/15/2015 1520   Lab Results  Component Value Date   WBC 3.6 (L) 09/23/2024   NEUTROABS 2.0 09/23/2024   HGB 14.5 09/23/2024   HCT 42.1 09/23/2024   MCV 87.3 09/23/2024   PLT 228 09/23/2024    Assessment/Plan Frontal glioblastoma (HCC)  Seizure disorder (HCC)  Nicholas Stevens is clinically stable today, now having dosed cycle #5 Temodar  (day 8/28) with concurrent avastin .  Labs are within normal limits today.  We recommended continuing treatment with cycle #5 Temozolomide , 150 mg/m2, on for five days and off for twenty three days in twenty eight day cycles. The patient will have a complete blood count performed on days 21 and 28 of each cycle, and a comprehensive metabolic panel performed on day 28 of each cycle. Labs may need to be performed more often. Zofran  will prescribed for home use for nausea/vomiting.   Patient elected to proceed with avastin , dose reduced to 7.5mg /kg IV 2q weeks given recent minor bleeding.  Avastin  will be helpful as concurrent therapy given burden of enhancing tumor and steroid requirement.  We reviewed side effects of avastin , including hypertension, bleeding/clotting events, wound healing impairment.  The patient will have a complete blood count, a comprehensive metabolic panel, and urine protein performed prior to each avastin  infusion. Labs may need to be performed more often.  Zofran  will prescribed for home use for nausea/vomiting.   Chemotherapy should be held for the following:  ANC less than 1,000  Platelets less than 100,000  LFT or creatinine greater than 2x ULN  If clinical concerns/contraindications develop   Avastin  should be held for the following:  ANC less than 500  Platelets less than 50,000   LFT or creatinine greater than 2x ULN  If clinical concerns/contraindications develop Informed consent was obtained verbally at bedside to proceed with oral chemotherapy.  Decadron  should remain off if tolerated.  Recommended continuing reduced dose Eliquis  to 2.5mg  BID given minor oral bleeding.  We ask that [employer] return to clinic in 2 weeks prior to dosing cycle #6, or sooner as needed.    All questions were answered. The patient knows to call the clinic with any problems, questions or concerns. No barriers to learning were detected.  The total time spent in the encounter was 30 minutes and more than 50% was on counseling and review of test results   Arthea MARLA Manns, MD Medical Director of Neuro-Oncology [facility] 09/23/24 2:22 PM

## 2024-09-23 NOTE — Patient Instructions (Signed)
 [facility] - A DEPT OF [facility]  Discharge Instructions: Thank you for choosing [facility] to provide your oncology and hematology care.   If you have a lab appointment with the Cancer Center, please go directly to the Cancer Center and check in at the registration area.   Wear comfortable clothing and clothing appropriate for easy access to any Portacath or PICC line.   We strive to give you quality time with your provider. You may need to reschedule your appointment if you arrive late (15 or more minutes).  Arriving late affects you and other patients whose appointments are after yours.  Also, if you miss three or more appointments without notifying the office, you may be dismissed from the clinic at the provider's discretion.      For prescription refill requests, have your pharmacy contact our office and allow 72 hours for refills to be completed.    Today you received the following chemotherapy and/or immunotherapy agents: bevacizumab      To help prevent nausea and vomiting after your treatment, we encourage you to take your nausea medication as directed.  BELOW ARE SYMPTOMS THAT SHOULD BE REPORTED IMMEDIATELY: *FEVER GREATER THAN 100.4 F (38 C) OR HIGHER *CHILLS OR SWEATING *NAUSEA AND VOMITING THAT IS NOT CONTROLLED WITH YOUR NAUSEA MEDICATION *UNUSUAL SHORTNESS OF BREATH *UNUSUAL BRUISING OR BLEEDING *URINARY PROBLEMS (pain or burning when urinating, or frequent urination) *BOWEL PROBLEMS (unusual diarrhea, constipation, pain near the anus) TENDERNESS IN MOUTH AND THROAT WITH OR WITHOUT PRESENCE OF ULCERS (sore throat, sores in mouth, or a toothache) UNUSUAL RASH, SWELLING OR PAIN  UNUSUAL VAGINAL DISCHARGE OR ITCHING   Items with * indicate a potential emergency and should be followed up as soon as possible or go to the Emergency Department if any problems should occur.  Please show the CHEMOTHERAPY ALERT CARD or  IMMUNOTHERAPY ALERT CARD at check-in to the Emergency Department and triage nurse.  Should you have questions after your visit or need to cancel or reschedule your appointment, please contact [facility] DE[facility]  Dept: [phone]  and follow the prompts.  Office hours are 8:00 a.m. to 4:30 p.m. Monday - Friday. Please note that voicemails left after 4:00 p.m. may not be returned until the following business day.  We are closed weekends and major holidays. You have access to a nurse at all times for urgent questions. Please call the main number to the clinic Dept: [phone] and follow the prompts.   For any non-urgent questions, you may also contact your provider using MyChart. We now offer e-Visits for anyone 81 and older to request care online for non-urgent symptoms. For details visit [email or web address].   Also download the MyChart app! Go to the app store, search "MyChart", open the app, select [place], and log in with your MyChart username and password.

## 2024-09-24 ENCOUNTER — Ambulatory Visit

## 2024-09-24 ENCOUNTER — Ambulatory Visit: Admitting: Internal Medicine

## 2024-09-24 ENCOUNTER — Other Ambulatory Visit

## 2024-10-07 ENCOUNTER — Inpatient Hospital Stay: Attending: Radiation Oncology | Admitting: Internal Medicine

## 2024-10-07 ENCOUNTER — Inpatient Hospital Stay

## 2024-10-07 ENCOUNTER — Encounter: Payer: Self-pay | Admitting: Internal Medicine

## 2024-10-07 VITALS — BP 132/92 | HR 92 | Temp 97.8°F | Resp 17 | Ht 74.0 in | Wt 276.0 lb

## 2024-10-07 DIAGNOSIS — Z79630 Long term (current) use of alkylating agent: Secondary | ICD-10-CM | POA: Insufficient documentation

## 2024-10-07 DIAGNOSIS — C711 Malignant neoplasm of frontal lobe: Secondary | ICD-10-CM | POA: Diagnosis not present

## 2024-10-07 DIAGNOSIS — G40909 Epilepsy, unspecified, not intractable, without status epilepticus: Secondary | ICD-10-CM | POA: Diagnosis not present

## 2024-10-07 LAB — CMP (CANCER CENTER ONLY)
ALT: 31 U/L (ref 0–44)
AST: 27 U/L (ref 15–41)
Albumin: 4.6 g/dL (ref 3.5–5.0)
Alkaline Phosphatase: 46 U/L (ref 38–126)
Anion gap: 9 (ref 5–15)
BUN: 15 mg/dL (ref 6–20)
CO2: 26 mmol/L (ref 22–32)
Calcium: 10.4 mg/dL — ABNORMAL HIGH (ref 8.9–10.3)
Chloride: 104 mmol/L (ref 98–111)
Creatinine: 1.07 mg/dL (ref 0.61–1.24)
GFR, Estimated: >60 mL/min (ref >60)
Glucose, Bld: 118 mg/dL — ABNORMAL HIGH (ref 70–99)
Potassium: 3.4 mmol/L — ABNORMAL LOW (ref 3.5–5.1)
Sodium: 139 mmol/L (ref 135–145)
Total Bilirubin: 0.7 mg/dL (ref 0.0–1.2)
Total Protein: 7.8 g/dL (ref 6.5–8.1)

## 2024-10-07 LAB — CBC WITH DIFFERENTIAL (CANCER CENTER ONLY)
Abs Immature Granulocytes: 0 K/uL (ref 0.00–0.07)
Basophils Absolute: 0 K/uL (ref 0.0–0.1)
Basophils Relative: 1 %
Eosinophils Absolute: 0.2 K/uL (ref 0.0–0.5)
Eosinophils Relative: 4 %
HCT: 43.2 % (ref 39.0–52.0)
Hemoglobin: 15 g/dL (ref 13.0–17.0)
Immature Granulocytes: 0 %
Lymphocytes Relative: 29 %
Lymphs Abs: 1.2 K/uL (ref 0.7–4.0)
MCH: 30.2 pg (ref 26.0–34.0)
MCHC: 34.7 g/dL (ref 30.0–36.0)
MCV: 86.9 fL (ref 80.0–100.0)
Monocytes Absolute: 0.3 K/uL (ref 0.1–1.0)
Monocytes Relative: 8 %
Neutro Abs: 2.3 K/uL (ref 1.7–7.7)
Neutrophils Relative %: 58 %
Platelet Count: 242 K/uL (ref 150–400)
RBC: 4.97 MIL/uL (ref 4.22–5.81)
RDW: 13.2 % (ref 11.5–15.5)
WBC Count: 3.9 K/uL — ABNORMAL LOW (ref 4.0–10.5)
nRBC: 0 % (ref 0.0–0.2)

## 2024-10-07 MED ORDER — APIXABAN 2.5 MG PO TABS: 2.5000 mg | ORAL_TABLET | Freq: Two times a day (BID) | ORAL | 2 refills | Status: DC

## 2024-10-07 MED ORDER — TEMOZOLOMIDE 100 MG PO CAPS: 150.0000 mg/m2/d | ORAL_CAPSULE | Freq: Every day | ORAL | 0 refills | Status: AC

## 2024-10-07 NOTE — Progress Notes (Signed)
 [facility] at [facility] [address]  [place], [place] [address] [phone]   Interval Evaluation  Date of Service: 10/07/24 Patient Name: Nicholas Stevens Patient MRN: [id number] Patient DOB: [birth year] Provider: Arthea MARLA Manns, MD  Identifying Statement:  Nicholas Stevens is a 54 y.o. male with right frontal glioblastoma    Oncologic History: Oncology History  Frontal glioblastoma (HCC)  12/18/2023 Surgery   Craniotomy, resection of right frontal mass with Dr. Debby; path is glioblastoma, IDH pending   01/22/2024 - 01/22/2024 Chemotherapy   Patient is on Treatment Plan : BRAIN GLIOBLASTOMA Radiation Therapy With Concurrent Temozolomide  75 mg/m2 Daily Followed By Sequential Maintenance Temozolomide  x 6-12 cycles     06/11/2024 -  Chemotherapy   Patient is on Treatment Plan : BRAIN Temozolomide  D1-5 + Bevacizumab  (10) D1,15 q28d         Interval History: Nicholas Stevens presents today having dosed cycle #5 5-day TMZ (09/16/24) with concurrent avastin .  Today he describes aches and pain in his shoulders knees which he has noticed following avastin  last 2 treatments.  He remains off decadron .  Denies new or progressive neurologic changes today.  Denies headaches or seizures.  Prior- presents for follow up after recent clinical changes.  He describes relatively rapid onset of left sided weakness starting ~10 days ago.  He was unable to walk and use the left arm meaningfully.  No seizure activity appreciated.  Decadron  was started 4mg  twice per day after 3-4 days of the weakness.  Once the steroid was started, he returned back to his prior baseline.  Continues on the Keppra .  Denies severe headaches.   H+P (01/08/24) Patient presented with several days of left sided clumsiness, impaired coordination.  Was found to have large right frontal mass, c/w primary brain tumor.  He underwent craniotomy, resection with Dr. Debby on 12/18/23; path  demonstrated glioblastoma.  Following surgery, he had persistent right sided weakness, he is currently in outpatient PT following discharge from rehab.  No seizures, headaches.     Medications: Current Outpatient Medications on File Prior to Visit  Medication Sig Dispense Refill   apixaban  (ELIQUIS ) 2.5 MG TABS tablet Take 1 tablet (2.5 mg total) by mouth 2 (two) times daily. 60 tablet 2   Baclofen  5 MG TABS Take 1 tablet (5 mg total) by mouth every 6 (six) hours as needed (hiccups). 90 tablet 1   camphor-menthol  (SARNA) lotion Apply topically as needed for itching. 222 mL 0   chlorhexidine  (PERIDEX ) 0.12 % solution Use as directed 15 mLs in the mouth or throat 3 (three) times daily.     Cyanocobalamin  (B-12 PO) Take 1 tablet by mouth daily.     fluconazole  (DIFLUCAN ) 100 MG tablet Take 2 tabs on day#1, then 1 tab daily on Days #2-10 11 tablet 1   furosemide  (LASIX ) 20 MG tablet Take 1 tablet (20 mg total) by mouth daily as needed. 30 tablet 0   latanoprost  (XALATAN ) 0.005 % ophthalmic solution INSTILL 1 DROP IN BOTH EYES AT BEDTIME 2.5 mL 2   levETIRAcetam  (KEPPRA ) 500 MG tablet Take 2 tablets (1,000 mg total) by mouth 2 (two) times daily. 120 tablet 2   magic mouthwash (nystatin , lidocaine , diphenhydrAMINE , alum & mag hydroxide) suspension Swish and swallow 5 mLs by mouth 4 (four) times daily as needed for mouth pain. 140 mL 1   Multiple Vitamin (MULTIVITAMIN WITH MINERALS) TABS tablet Take 1 tablet by mouth daily.     olmesartan  (  BENICAR ) 20 MG tablet Take 1 tablet (20 mg total) by mouth daily. 90 tablet 3   Omega-3 Fatty Acids (FISH OIL) 1000 MG CAPS Take 1,000 mg by mouth daily.     pantoprazole  (PROTONIX ) 40 MG tablet Take 1 tablet (40 mg total) by mouth 2 (two) times daily. 180 tablet 3   pimecrolimus  (ELIDEL ) 1 % cream Apply 1 Application topically 2 (two) times daily as needed (Dermatitis).     senna-docusate (SENOKOT-S) 8.6-50 MG tablet Take 2 tablets by mouth daily at 6 (six) AM. 60  tablet 0   triamcinolone  cream (KENALOG ) 0.1 % Apply 1 Application topically daily. Use on affected areas for 2 weeks, then stop for 2 weeks. Repeat until clear if needed. 453 g 0   triamterene -hydrochlorothiazide  (MAXZIDE -25) 37.5-25 MG tablet Take 1 tablet by mouth daily. 90 tablet 3   zolpidem  (AMBIEN ) 10 MG tablet TAKE 1 TABLET BY MOUTH EVERYDAY AT BEDTIME 90 tablet 1   No current facility-administered medications on file prior to visit.    Allergies: No Known Allergies Past Medical History:  Past Medical History:  Diagnosis Date   DDD (degenerative disc disease), lumbar    HNP (herniated nucleus pulposus)    Hypertension    Liver hemangioma    Morbid obesity (HCC)    OSA (obstructive sleep apnea)    no cpap used since weight loss   Overweight(278.02)    Plantar fasciitis of right foot    Sleep apnea    Tinea barbae    Past Surgical History:  Past Surgical History:  Procedure Laterality Date   BREATH TEK H PYLORI N/A 08/20/2013   Procedure: BREATH TEK H PYLORI;  Surgeon: Donnice KATHEE Lunger, MD;  Location: [facility];  Service: General;  Laterality: N/A;   COLONOSCOPY WITH PROPOFOL  N/A 05/05/2022   Procedure: COLONOSCOPY WITH PROPOFOL ;  Surgeon: Albertus Gordy HERO, MD;  Location: [facility];  Service: Gastroenterology;  Laterality: N/A;   CRANIOTOMY Right 12/18/2023   Procedure: Right Frontal Stereotactic Craniotomy for Resection of Tumor;  Surgeon: Debby Dorn MATSU, MD;  Location: [facility];  Service: Neurosurgery;  Laterality: Right;   ESOPHAGOGASTRODUODENOSCOPY (EGD) WITH PROPOFOL  N/A 03/15/2023   Procedure: ESOPHAGOGASTRODUODENOSCOPY (EGD) WITH PROPOFOL ;  Surgeon: Avram Lupita BRAVO, MD;  Location: [facility];  Service: Gastroenterology;  Laterality: N/A;   LAPAROSCOPIC APPENDECTOMY  2007   Dr Curvin   LAPAROSCOPIC GASTRIC SLEEVE RESECTION N/A 10/08/2013   Procedure: LAPAROSCOPIC SLEEVE GASTRECTOMY;  Surgeon: Donnice KATHEE Lunger, MD;  Location: [facility];  Service: General;  Laterality:  N/A;   LUMBAR LAMINECTOMY/DECOMPRESSION MICRODISCECTOMY N/A 09/16/2015   Procedure: MICRO LUMBAR DECOMPRESSION, MICRODISCECTOMY L5 - S1;  Surgeon: Reyes Billing, MD;  Location: [facility];  Service: Orthopedics;  Laterality: N/A;   POLYPECTOMY  05/05/2022   Procedure: POLYPECTOMY;  Surgeon: Albertus Gordy HERO, MD;  Location: [facility];  Service: Gastroenterology;;   ROTATOR CUFF REPAIR Right 2005   Dr Harden   Social History:  Social History   Socioeconomic History   Marital status: Married    Spouse name: Obrian Bulson   Number of children: 3   Years of education: Not on file   Highest education level: 12th grade  Occupational History   Occupation: Doctor, general practice PD    Employer: UNEMPLOYED  Tobacco Use   Smoking status: Never    Passive exposure: Never   Smokeless tobacco: Never  Vaping Use   Vaping status: Never Used  Substance and Sexual Activity   Alcohol  use: No   Drug use: No  Sexual activity: Not on file  Other Topics Concern   Not on file  Social History Narrative   Married 18+ years   3 children - ages approx 81, 41 and 5...daughter age 43 w/ DM type 1   Social Drivers of Corporate investment banker Strain: Low Risk  (04/02/2023)   Overall Financial Resource Strain (CARDIA)    Difficulty of Paying Living Expenses: Not hard at all  Food Insecurity: No Food Insecurity (12/18/2023)   Hunger Vital Sign    Worried About Running Out of Food in the Last Year: Never true    Ran Out of Food in the Last Year: Never true  Transportation Needs: No Transportation Needs (12/18/2023)   PRAPARE - Administrator, Civil Service (Medical): No    Lack of Transportation (Non-Medical): No  Physical Activity: Insufficiently Active (04/02/2023)   Exercise Vital Sign    Days of Exercise per Week: 4 days    Minutes of Exercise per Session: 30 min  Stress: No Stress Concern Present (04/02/2023)   [employer] of Occupational Health - Occupational Stress Questionnaire    Feeling of Stress :  Not at all  Social Connections: Socially Integrated (04/02/2023)   Social Connection and Isolation Panel    Frequency of Communication with Friends and Family: More than three times a week    Frequency of Social Gatherings with Friends and Family: Once a week    Attends Religious Services: More than 4 times per year    Active Member of [employer] or Organizations: Yes    Attends Engineer, structural: More than 4 times per year    Marital Status: Married  Catering manager Violence: Not At Risk (12/18/2023)   Humiliation, Afraid, Rape, and Kick questionnaire    Fear of Current or Ex-Partner: No    Emotionally Abused: No    Physically Abused: No    Sexually Abused: No   Family History:  Family History  Problem Relation Age of Onset   Other Mother        hx of DJD   Liver disease Father        ETOH, Hep C   Hypertension Father    Colon cancer Neg Hx    Colon polyps Neg Hx    Esophageal cancer Neg Hx    Rectal cancer Neg Hx    Stomach cancer Neg Hx     Review of Systems: Constitutional: Doesn't report fevers, chills or abnormal weight loss Eyes: Doesn't report blurriness of vision Ears, nose, mouth, throat, and face: Doesn't report sore throat Respiratory: Doesn't report cough, dyspnea or wheezes Cardiovascular: Doesn't report palpitation, chest discomfort  Gastrointestinal:  Doesn't report nausea, constipation, diarrhea GU: Doesn't report incontinence Skin: Doesn't report skin rashes Neurological: Per HPI Musculoskeletal: Doesn't report joint pain Behavioral/Psych: Doesn't report anxiety  Physical Exam: Vitals:   10/07/24 1520 10/07/24 1525  BP: (!) 140/91 (!) 132/92  Pulse: 92   Resp: 17   Temp: 97.8 F (36.6 C)   SpO2: 100%     KPS: 70. General: Alert, cooperative, pleasant, in no acute distress Head: Normal EENT: No conjunctival injection or scleral icterus.  Lungs: Resp effort normal Cardiac: Regular rate Abdomen: Non-distended abdomen Skin: No rashes  cyanosis or petechiae. Extremities: No clubbing or edema  Neurologic Exam: Mental Status: Awake, alert, attentive to examiner. Oriented to self and environment. Language is fluent with intact comprehension.  Cranial Nerves: Visual acuity is grossly normal. Visual fields are full. Extra-ocular movements  intact. No ptosis. Face is symmetric Motor: Tone and bulk are normal. Power is 4+/5 in left arm and 4/5 leg. Reflexes are symmetric, no pathologic reflexes present.  Sensory: Intact to light touch Gait: Hemiparetic   Labs: I have reviewed the data as listed    Component Value Date/Time   NA 138 09/23/2024 1401   K 3.5 09/23/2024 1401   CL 105 09/23/2024 1401   CO2 26 09/23/2024 1401   GLUCOSE 108 (H) 09/23/2024 1401   BUN 11 09/23/2024 1401   CREATININE 0.96 09/23/2024 1401   CALCIUM 9.7 09/23/2024 1401   PROT 7.4 09/23/2024 1401   ALBUMIN 4.3 09/23/2024 1401   AST 37 09/23/2024 1401   ALT 49 (H) 09/23/2024 1401   ALKPHOS 40 09/23/2024 1401   BILITOT 0.7 09/23/2024 1401   GFRNONAA >60 09/23/2024 1401   GFRAA >60 09/15/2015 1520   Lab Results  Component Value Date   WBC 3.9 (L) 10/07/2024   NEUTROABS 2.3 10/07/2024   HGB 15.0 10/07/2024   HCT 43.2 10/07/2024   MCV 86.9 10/07/2024   PLT 242 10/07/2024    Assessment/Plan Frontal glioblastoma (HCC)  Seizure disorder (HCC)  Nicholas Stevens is clinically stable today, now nearly completed cycle #5 Temodar  (day 22/28) with concurrent avastin .  Labs are within normal limits today.  We recommended continuing treatment with cycle #6 Temozolomide , 150 mg/m2, on for five days and off for twenty three days in twenty eight day cycles. The patient will have a complete blood count performed on days 21 and 28 of each cycle, and a comprehensive metabolic panel performed on day 28 of each cycle. Labs may need to be performed more often. Zofran  will prescribed for home use for nausea/vomiting.   Patient elected to proceed with  avastin , dose reduced to 7.5mg /kg IV 2q weeks given recent minor bleeding.  Avastin  will be helpful as concurrent therapy given burden of enhancing tumor and steroid requirement.  We reviewed side effects of avastin , including hypertension, bleeding/clotting events, wound healing impairment.  The patient will have a complete blood count, a comprehensive metabolic panel, and urine protein performed prior to each avastin  infusion. Labs may need to be performed more often. Zofran  will prescribed for home use for nausea/vomiting.   Avastin  will be held today due to arthralgias, patient preference.  We will plan to administer again in 2 weeks.    Chemotherapy should be held for the following:  ANC less than 1,000  Platelets less than 100,000  LFT or creatinine greater than 2x ULN  If clinical concerns/contraindications develop   Avastin  should be held for the following:  ANC less than 500  Platelets less than 50,000  LFT or creatinine greater than 2x ULN  If clinical concerns/contraindications develop Informed consent was obtained verbally at bedside to proceed with oral chemotherapy.  Decadron  should remain off if tolerated.  Recommended continuing reduced dose Eliquis  to 2.5mg  BID given minor oral bleeding.  We ask that [employer] return to clinic in 2 weeks for avastin  infusion, or sooner as needed.  MRI will be requested for 11/07/24.  All questions were answered. The patient knows to call the clinic with any problems, questions or concerns. No barriers to learning were detected.  The total time spent in the encounter was 30 minutes and more than 50% was on counseling and review of test results   Arthea MARLA Manns, MD Medical Director of Neuro-Oncology [facility] 10/07/24 3:29 PM

## 2024-10-14 ENCOUNTER — Other Ambulatory Visit: Payer: Self-pay | Admitting: Internal Medicine

## 2024-10-16 ENCOUNTER — Telehealth: Payer: Self-pay | Admitting: *Deleted

## 2024-10-16 NOTE — Telephone Encounter (Signed)
 PC to patient, informed him MRI brain is scheduled on 11/07/24 at 12:00.  Patient states he will need to reschedule to a different date.  Central Scheduling number given.

## 2024-10-17 ENCOUNTER — Other Ambulatory Visit: Payer: Self-pay

## 2024-10-18 ENCOUNTER — Other Ambulatory Visit: Payer: Self-pay

## 2024-10-18 ENCOUNTER — Encounter: Payer: Self-pay | Admitting: Internal Medicine

## 2024-10-18 DIAGNOSIS — N521 Erectile dysfunction due to diseases classified elsewhere: Secondary | ICD-10-CM

## 2024-10-22 ENCOUNTER — Telehealth: Payer: Self-pay | Admitting: Internal Medicine

## 2024-10-22 ENCOUNTER — Inpatient Hospital Stay

## 2024-10-22 ENCOUNTER — Inpatient Hospital Stay (HOSPITAL_BASED_OUTPATIENT_CLINIC_OR_DEPARTMENT_OTHER): Admitting: Internal Medicine

## 2024-10-22 VITALS — BP 127/98 | HR 90 | Temp 98.1°F | Resp 20 | Wt 273.9 lb

## 2024-10-22 DIAGNOSIS — C711 Malignant neoplasm of frontal lobe: Secondary | ICD-10-CM | POA: Diagnosis not present

## 2024-10-22 DIAGNOSIS — G40909 Epilepsy, unspecified, not intractable, without status epilepticus: Secondary | ICD-10-CM | POA: Diagnosis not present

## 2024-10-22 LAB — CMP (CANCER CENTER ONLY)
ALT: 44 U/L (ref 0–44)
AST: 37 U/L (ref 15–41)
Albumin: 4.6 g/dL (ref 3.5–5.0)
Alkaline Phosphatase: 39 U/L (ref 38–126)
Anion gap: 8 (ref 5–15)
BUN: 16 mg/dL (ref 6–20)
CO2: 25 mmol/L (ref 22–32)
Calcium: 9.9 mg/dL (ref 8.9–10.3)
Chloride: 105 mmol/L (ref 98–111)
Creatinine: 1.33 mg/dL — ABNORMAL HIGH (ref 0.61–1.24)
GFR, Estimated: >60 mL/min (ref >60)
Glucose, Bld: 158 mg/dL — ABNORMAL HIGH (ref 70–99)
Potassium: 3.3 mmol/L — ABNORMAL LOW (ref 3.5–5.1)
Sodium: 138 mmol/L (ref 135–145)
Total Bilirubin: 0.7 mg/dL (ref 0.0–1.2)
Total Protein: 7.8 g/dL (ref 6.5–8.1)

## 2024-10-22 LAB — CBC WITH DIFFERENTIAL (CANCER CENTER ONLY)
Abs Immature Granulocytes: 0.01 K/uL (ref 0.00–0.07)
Basophils Absolute: 0 K/uL (ref 0.0–0.1)
Basophils Relative: 1 %
Eosinophils Absolute: 0.1 K/uL (ref 0.0–0.5)
Eosinophils Relative: 3 %
HCT: 45.1 % (ref 39.0–52.0)
Hemoglobin: 15.8 g/dL (ref 13.0–17.0)
Immature Granulocytes: 0 %
Lymphocytes Relative: 28 %
Lymphs Abs: 1.1 K/uL (ref 0.7–4.0)
MCH: 29.8 pg (ref 26.0–34.0)
MCHC: 35 g/dL (ref 30.0–36.0)
MCV: 85.1 fL (ref 80.0–100.0)
Monocytes Absolute: 0.3 K/uL (ref 0.1–1.0)
Monocytes Relative: 8 %
Neutro Abs: 2.3 K/uL (ref 1.7–7.7)
Neutrophils Relative %: 60 %
Platelet Count: 205 K/uL (ref 150–400)
RBC: 5.3 MIL/uL (ref 4.22–5.81)
RDW: 13.5 % (ref 11.5–15.5)
WBC Count: 3.7 K/uL — ABNORMAL LOW (ref 4.0–10.5)
nRBC: 0 % (ref 0.0–0.2)

## 2024-10-22 NOTE — Telephone Encounter (Signed)
 Scheduled patient for next appointment. Called and spoke with the patient, he is aware.

## 2024-10-22 NOTE — Progress Notes (Signed)
 [facility] [place] at [facility] [address]  [place], [place] [address] [phone]   Interval Evaluation  Date of Service: 10/22/24 Patient Name: Nicholas Stevens Patient MRN: [id number] Patient DOB: [birth year] Provider: Arthea MARLA Manns, MD  Identifying Statement:  Nicholas Stevens is a 53 y.o. male with right frontal glioblastoma    Oncologic History: Oncology History  Frontal glioblastoma (HCC)  12/18/2023 Surgery   Craniotomy, resection of right frontal mass with Dr. Debby; path is glioblastoma, IDH pending   01/22/2024 - 01/22/2024 Chemotherapy   Patient is on Treatment Plan : BRAIN GLIOBLASTOMA Radiation Therapy With Concurrent Temozolomide  75 mg/m2 Daily Followed By Sequential Maintenance Temozolomide  x 6-12 cycles     06/11/2024 -  Chemotherapy   Patient is on Treatment Plan : BRAIN Temozolomide  D1-5 + Bevacizumab  (10) D1,15 q28d         Interval History: Nicholas Stevens presents today having dosed cycle #6 5-day TMZ starting on 10/13/24.  Today he describes considerable improvement in aches and pain in his shoulders knees, which he had noticed following avastin  treatments.  He remains off decadron .  Denies new or progressive neurologic changes today.  Denies headaches or seizures.  Prior- presents for follow up after recent clinical changes.  He describes relatively rapid onset of left sided weakness starting ~10 days ago.  He was unable to walk and use the left arm meaningfully.  No seizure activity appreciated.  Decadron  was started 4mg  twice per day after 3-4 days of the weakness.  Once the steroid was started, he returned back to his prior baseline.  Continues on the Keppra .  Denies severe headaches.   H+P (01/08/24) Patient presented with several days of left sided clumsiness, impaired coordination.  Was found to have large right frontal mass, c/w primary brain tumor.  He underwent craniotomy, resection with Dr. Debby on 12/18/23;  path demonstrated glioblastoma.  Following surgery, he had persistent right sided weakness, he is currently in outpatient PT following discharge from rehab.  No seizures, headaches.     Medications: Current Outpatient Medications on File Prior to Visit  Medication Sig Dispense Refill   apixaban  (ELIQUIS ) 2.5 MG TABS tablet Take 1 tablet (2.5 mg total) by mouth 2 (two) times daily. 60 tablet 2   Baclofen  5 MG TABS Take 1 tablet (5 mg total) by mouth every 6 (six) hours as needed (hiccups). 90 tablet 1   camphor-menthol  (SARNA) lotion Apply topically as needed for itching. 222 mL 0   chlorhexidine  (PERIDEX ) 0.12 % solution Use as directed 15 mLs in the mouth or throat 3 (three) times daily.     Cyanocobalamin  (B-12 PO) Take 1 tablet by mouth daily.     fluconazole  (DIFLUCAN ) 100 MG tablet Take 2 tabs on day#1, then 1 tab daily on Days #2-10 11 tablet 1   furosemide  (LASIX ) 20 MG tablet Take 1 tablet (20 mg total) by mouth daily as needed. 30 tablet 0   latanoprost  (XALATAN ) 0.005 % ophthalmic solution INSTILL 1 DROP IN BOTH EYES AT BEDTIME 2.5 mL 2   levETIRAcetam  (KEPPRA ) 500 MG tablet TAKE 2 TABLETS(1000 MG) BY MOUTH TWICE DAILY 120 tablet 2   magic mouthwash (nystatin , lidocaine , diphenhydrAMINE , alum & mag hydroxide) suspension Swish and swallow 5 mLs by mouth 4 (four) times daily as needed for mouth pain. 140 mL 1   Multiple Vitamin (MULTIVITAMIN WITH MINERALS) TABS tablet Take 1 tablet by mouth daily.     olmesartan  (BENICAR ) 20 MG tablet  Take 1 tablet (20 mg total) by mouth daily. 90 tablet 3   Omega-3 Fatty Acids (FISH OIL) 1000 MG CAPS Take 1,000 mg by mouth daily.     pantoprazole  (PROTONIX ) 40 MG tablet Take 1 tablet (40 mg total) by mouth 2 (two) times daily. 180 tablet 3   pimecrolimus  (ELIDEL ) 1 % cream Apply 1 Application topically 2 (two) times daily as needed (Dermatitis).     senna-docusate (SENOKOT-S) 8.6-50 MG tablet Take 2 tablets by mouth daily at 6 (six) AM. 60 tablet 0    temozolomide  (TEMODAR ) 100 MG capsule Take 4 capsules (400 mg total) by mouth daily. Take for 5 days on, 23 days off. Repeat every 28 days. Take on an empty stomach 1 hour before or 2 hours after a meal. 20 capsule 0   triamcinolone  cream (KENALOG ) 0.1 % Apply 1 Application topically daily. Use on affected areas for 2 weeks, then stop for 2 weeks. Repeat until clear if needed. 453 g 0   triamterene -hydrochlorothiazide  (MAXZIDE -25) 37.5-25 MG tablet Take 1 tablet by mouth daily. 90 tablet 3   zolpidem  (AMBIEN ) 10 MG tablet TAKE 1 TABLET BY MOUTH EVERYDAY AT BEDTIME 90 tablet 1   No current facility-administered medications on file prior to visit.    Allergies: No Known Allergies Past Medical History:  Past Medical History:  Diagnosis Date   DDD (degenerative disc disease), lumbar    HNP (herniated nucleus pulposus)    Hypertension    Liver hemangioma    Morbid obesity (HCC)    OSA (obstructive sleep apnea)    no cpap used since weight loss   Overweight(278.02)    Plantar fasciitis of right foot    Sleep apnea    Tinea barbae    Past Surgical History:  Past Surgical History:  Procedure Laterality Date   BREATH TEK H PYLORI N/A 08/20/2013   Procedure: BREATH TEK H PYLORI;  Surgeon: Donnice KATHEE Lunger, MD;  Location: [facility];  Service: General;  Laterality: N/A;   COLONOSCOPY WITH PROPOFOL  N/A 05/05/2022   Procedure: COLONOSCOPY WITH PROPOFOL ;  Surgeon: Albertus Gordy HERO, MD;  Location: [facility];  Service: Gastroenterology;  Laterality: N/A;   CRANIOTOMY Right 12/18/2023   Procedure: Right Frontal Stereotactic Craniotomy for Resection of Tumor;  Surgeon: Debby Dorn MATSU, MD;  Location: [facility];  Service: Neurosurgery;  Laterality: Right;   ESOPHAGOGASTRODUODENOSCOPY (EGD) WITH PROPOFOL  N/A 03/15/2023   Procedure: ESOPHAGOGASTRODUODENOSCOPY (EGD) WITH PROPOFOL ;  Surgeon: Avram Lupita BRAVO, MD;  Location: [facility];  Service: Gastroenterology;  Laterality: N/A;   LAPAROSCOPIC  APPENDECTOMY  2007   Dr Curvin   LAPAROSCOPIC GASTRIC SLEEVE RESECTION N/A 10/08/2013   Procedure: LAPAROSCOPIC SLEEVE GASTRECTOMY;  Surgeon: Donnice KATHEE Lunger, MD;  Location: [facility];  Service: General;  Laterality: N/A;   LUMBAR LAMINECTOMY/DECOMPRESSION MICRODISCECTOMY N/A 09/16/2015   Procedure: MICRO LUMBAR DECOMPRESSION, MICRODISCECTOMY L5 - S1;  Surgeon: Reyes Billing, MD;  Location: [facility];  Service: Orthopedics;  Laterality: N/A;   POLYPECTOMY  05/05/2022   Procedure: POLYPECTOMY;  Surgeon: Albertus Gordy HERO, MD;  Location: [facility];  Service: Gastroenterology;;   ROTATOR CUFF REPAIR Right 2005   Dr Harden   Social History:  Social History   Socioeconomic History   Marital status: Married    Spouse name: Sohrab Keelan   Number of children: 3   Years of education: Not on file   Highest education level: 12th grade  Occupational History   Occupation: Doctor, General Practice PD    Employer: UNEMPLOYED  Tobacco  Use   Smoking status: Never    Passive exposure: Never   Smokeless tobacco: Never  Vaping Use   Vaping status: Never Used  Substance and Sexual Activity   Alcohol  use: No   Drug use: No   Sexual activity: Not on file  Other Topics Concern   Not on file  Social History Narrative   Married 18+ years   3 children - ages approx 63, 20 and 5...daughter age 12 w/ DM type 1   Social Drivers of Corporate Investment Banker Strain: Low Risk  (04/02/2023)   Overall Financial Resource Strain (CARDIA)    Difficulty of Paying Living Expenses: Not hard at all  Food Insecurity: No Food Insecurity (12/18/2023)   Hunger Vital Sign    Worried About Running Out of Food in the Last Year: Never true    Ran Out of Food in the Last Year: Never true  Transportation Needs: No Transportation Needs (12/18/2023)   PRAPARE - Administrator, Civil Service (Medical): No    Lack of Transportation (Non-Medical): No  Physical Activity: Insufficiently Active (04/02/2023)   Exercise Vital Sign    Days of  Exercise per Week: 4 days    Minutes of Exercise per Session: 30 min  Stress: No Stress Concern Present (04/02/2023)   [employer] of Occupational Health - Occupational Stress Questionnaire    Feeling of Stress : Not at all  Social Connections: Socially Integrated (04/02/2023)   Social Connection and Isolation Panel    Frequency of Communication with Friends and Family: More than three times a week    Frequency of Social Gatherings with Friends and Family: Once a week    Attends Religious Services: More than 4 times per year    Active Member of [employer] or Organizations: Yes    Attends Engineer, Structural: More than 4 times per year    Marital Status: Married  Catering Manager Violence: Not At Risk (12/18/2023)   Humiliation, Afraid, Rape, and Kick questionnaire    Fear of Current or Ex-Partner: No    Emotionally Abused: No    Physically Abused: No    Sexually Abused: No   Family History:  Family History  Problem Relation Age of Onset   Other Mother        hx of DJD   Liver disease Father        ETOH, Hep C   Hypertension Father    Colon cancer Neg Hx    Colon polyps Neg Hx    Esophageal cancer Neg Hx    Rectal cancer Neg Hx    Stomach cancer Neg Hx     Review of Systems: Constitutional: Doesn't report fevers, chills or abnormal weight loss Eyes: Doesn't report blurriness of vision Ears, nose, mouth, throat, and face: Doesn't report sore throat Respiratory: Doesn't report cough, dyspnea or wheezes Cardiovascular: Doesn't report palpitation, chest discomfort  Gastrointestinal:  Doesn't report nausea, constipation, diarrhea GU: Doesn't report incontinence Skin: Doesn't report skin rashes Neurological: Per HPI Musculoskeletal: Doesn't report joint pain Behavioral/Psych: Doesn't report anxiety  Physical Exam: Vitals:   10/22/24 1220  BP: (!) 127/98  Pulse: 90  Resp: 20  Temp: 98.1 F (36.7 C)  SpO2: 98%     KPS: 70. General: Alert, cooperative,  pleasant, in no acute distress Head: Normal EENT: No conjunctival injection or scleral icterus.  Lungs: Resp effort normal Cardiac: Regular rate Abdomen: Non-distended abdomen Skin: No rashes cyanosis or petechiae. Extremities: No clubbing or  edema  Neurologic Exam: Mental Status: Awake, alert, attentive to examiner. Oriented to self and environment. Language is fluent with intact comprehension.  Cranial Nerves: Visual acuity is grossly normal. Visual fields are full. Extra-ocular movements intact. No ptosis. Face is symmetric Motor: Tone and bulk are normal. Power is 4+/5 in left arm and 4/5 leg. Reflexes are symmetric, no pathologic reflexes present.  Sensory: Intact to light touch Gait: Hemiparetic   Labs: I have reviewed the data as listed    Component Value Date/Time   NA 139 10/07/2024 1443   K 3.4 (L) 10/07/2024 1443   CL 104 10/07/2024 1443   CO2 26 10/07/2024 1443   GLUCOSE 118 (H) 10/07/2024 1443   BUN 15 10/07/2024 1443   CREATININE 1.07 10/07/2024 1443   CALCIUM 10.4 (H) 10/07/2024 1443   PROT 7.8 10/07/2024 1443   ALBUMIN 4.6 10/07/2024 1443   AST 27 10/07/2024 1443   ALT 31 10/07/2024 1443   ALKPHOS 46 10/07/2024 1443   BILITOT 0.7 10/07/2024 1443   GFRNONAA >60 10/07/2024 1443   GFRAA >60 09/15/2015 1520   Lab Results  Component Value Date   WBC 3.9 (L) 10/07/2024   NEUTROABS 2.3 10/07/2024   HGB 15.0 10/07/2024   HCT 43.2 10/07/2024   MCV 86.9 10/07/2024   PLT 242 10/07/2024    Assessment/Plan Frontal glioblastoma (HCC)  Seizure disorder (HCC)  Nicholas Stevens is clinically stable today, now having dosed cycle #6 Temodar  (day 10/28) with concurrent avastin .  Labs are within normal limits today.  We recommended continuing treatment with cycle #6 Temozolomide , 150 mg/m2, on for five days and off for twenty three days in twenty eight day cycles. The patient will have a complete blood count performed on days 21 and 28 of each cycle, and a  comprehensive metabolic panel performed on day 28 of each cycle. Labs may need to be performed more often. Zofran  will prescribed for home use for nausea/vomiting.   Avastin  will continue to be held due to arthralgias, physician and patient preference.  Chemotherapy should be held for the following:  ANC less than 1,000  Platelets less than 100,000  LFT or creatinine greater than 2x ULN  If clinical concerns/contraindications develop   Avastin  should be held for the following:  ANC less than 500  Platelets less than 50,000  LFT or creatinine greater than 2x ULN  If clinical concerns/contraindications develop  Informed consent was obtained verbally at bedside to proceed with oral chemotherapy.  Decadron  should remain off if tolerated.  Recommended continuing reduced dose Eliquis  to 2.5mg  BID.  We ask that [employer] return to clinic in 2 weeks with MRI brain for review.  All questions were answered. The patient knows to call the clinic with any problems, questions or concerns. No barriers to learning were detected.  The total time spent in the encounter was 30 minutes and more than 50% was on counseling and review of test results   Arthea MARLA Manns, MD Medical Director of Neuro-Oncology [facility] at [facility] [place] 10/22/24 12:14 PM

## 2024-10-23 ENCOUNTER — Other Ambulatory Visit: Payer: Self-pay

## 2024-10-24 ENCOUNTER — Other Ambulatory Visit: Payer: Self-pay

## 2024-10-26 ENCOUNTER — Encounter: Payer: Self-pay | Admitting: Internal Medicine

## 2024-10-27 ENCOUNTER — Other Ambulatory Visit: Payer: Self-pay | Admitting: Internal Medicine

## 2024-10-27 DIAGNOSIS — C711 Malignant neoplasm of frontal lobe: Secondary | ICD-10-CM

## 2024-10-28 ENCOUNTER — Other Ambulatory Visit: Payer: Self-pay

## 2024-10-28 MED ORDER — TRIAMTERENE-HCTZ 37.5-25 MG PO TABS: 1.0000 | ORAL_TABLET | Freq: Every day | ORAL | 3 refills | Status: AC

## 2024-10-31 ENCOUNTER — Other Ambulatory Visit: Payer: Self-pay | Admitting: Internal Medicine

## 2024-10-31 ENCOUNTER — Ambulatory Visit: Admitting: Internal Medicine

## 2024-10-31 ENCOUNTER — Encounter: Payer: Self-pay | Admitting: Internal Medicine

## 2024-10-31 VITALS — BP 106/78 | HR 72 | Temp 98.4°F | Ht 74.0 in | Wt 276.8 lb

## 2024-10-31 DIAGNOSIS — Z7901 Long term (current) use of anticoagulants: Secondary | ICD-10-CM | POA: Diagnosis not present

## 2024-10-31 DIAGNOSIS — M25512 Pain in left shoulder: Secondary | ICD-10-CM | POA: Diagnosis not present

## 2024-10-31 DIAGNOSIS — K219 Gastro-esophageal reflux disease without esophagitis: Secondary | ICD-10-CM | POA: Diagnosis not present

## 2024-10-31 MED ORDER — CELECOXIB 200 MG PO CAPS
ORAL_CAPSULE | ORAL | 1 refills | Status: DC
Start: 2024-10-31 — End: 2024-11-01

## 2024-10-31 NOTE — Assessment & Plan Note (Signed)
 On pantoprazole  twice daily.  Pantoprazole  should protect stomach lining from Celebrex potential side effect

## 2024-10-31 NOTE — Telephone Encounter (Signed)
 Patient has labs and see's provider prior to refilling oral chemo. Appointment already scheduled.

## 2024-10-31 NOTE — Patient Instructions (Signed)
 USEFUL THINGS FOR ARTHRITIS and musculoskeletal pains:    A rice sock heating pad refers to a homemade heating pad created by filling a sock with uncooked rice, which can be heated in a microwave to provide a warm compress for sore muscles, pain relief, or other applications; essentially, it's a simple way to generate heat using readily available materials.  Key points about rice sock heat: How to make it: Fill a clean sock (preferably a tube sock) about 2/3 full with uncooked rice, tie a knot at the top to secure the rice inside.  Heating it up: Place the rice sock in the microwave and heat in short intervals (usually around 30 seconds at a time) until it reaches the desired warmth.  Important considerations: Check temperature before applying: Always test the temperature of the rice sock before applying it to your skin to avoid burns.  Use a towel to protect skin: Wrap the rice sock in a thin towel to distribute the heat evenly and protect your skin.  Uses: Muscle aches and pains  Menstrual cramps  Neck pain  Arthritis discomfort   painful areas

## 2024-10-31 NOTE — Assessment & Plan Note (Signed)
 Acute pain in the left shoulder likely due to rotator cuff and bicipital tendon strain.  Options to treat were discussed.  Prescribed Celebrex 200 mg twice a day for a few days then once a day.  He is on pantoprazole  twice a day.  Anticoagulation was acknowledged.  Range of motion exercises, heat/ice. The patient denied physical therapy. Orthopedic surgery consultation

## 2024-10-31 NOTE — Progress Notes (Signed)
 Subjective:  Patient ID: Nicholas Stevens, male    DOB: 08-17-1971  Age: 53 y.o. MRN: [id number]  CC: Shoulder Pain (Shoulder pain (left) for three weeks. History of torn rotator cuff on right shoulder, pain/immobility seems to be similar)   HPI Nicholas Stevens presents for L shoulder pain x 3 weeks.  The pain started suddenly when he turned back and passed a drink to his granddaughter in the car.  The pain has been severe with range of motion.  No pain at rest.  She tried ice/heat.  The pain is not getting any better. Off Avastin  since 4 wks ago - scan is pending.  Not on steroids.  She is on Eliquis   Outpatient Medications Prior to Visit  Medication Sig Dispense Refill   apixaban  (ELIQUIS ) 2.5 MG TABS tablet Take 1 tablet (2.5 mg total) by mouth 2 (two) times daily. 60 tablet 2   Baclofen  5 MG TABS Take 1 tablet (5 mg total) by mouth every 6 (six) hours as needed (hiccups). 90 tablet 1   camphor-menthol  (SARNA) lotion Apply topically as needed for itching. 222 mL 0   chlorhexidine  (PERIDEX ) 0.12 % solution Use as directed 15 mLs in the mouth or throat 3 (three) times daily.     Cyanocobalamin  (B-12 PO) Take 1 tablet by mouth daily.     fluconazole  (DIFLUCAN ) 100 MG tablet Take 2 tabs on day#1, then 1 tab daily on Days #2-10 11 tablet 1   furosemide  (LASIX ) 20 MG tablet Take 1 tablet (20 mg total) by mouth daily as needed. 30 tablet 0   latanoprost  (XALATAN ) 0.005 % ophthalmic solution INSTILL 1 DROP IN BOTH EYES AT BEDTIME 2.5 mL 2   levETIRAcetam  (KEPPRA ) 500 MG tablet TAKE 2 TABLETS(1000 MG) BY MOUTH TWICE DAILY 120 tablet 2   magic mouthwash (nystatin , lidocaine , diphenhydrAMINE , alum & mag hydroxide) suspension Swish and swallow 5 mLs by mouth 4 (four) times daily as needed for mouth pain. 140 mL 1   Multiple Vitamin (MULTIVITAMIN WITH MINERALS) TABS tablet Take 1 tablet by mouth daily.     olmesartan  (BENICAR ) 20 MG tablet Take 1 tablet (20 mg total) by mouth daily. 90 tablet  3   Omega-3 Fatty Acids (FISH OIL) 1000 MG CAPS Take 1,000 mg by mouth daily.     pantoprazole  (PROTONIX ) 40 MG tablet Take 1 tablet (40 mg total) by mouth 2 (two) times daily. 180 tablet 3   pimecrolimus  (ELIDEL ) 1 % cream Apply 1 Application topically 2 (two) times daily as needed (Dermatitis).     senna-docusate (SENOKOT-S) 8.6-50 MG tablet Take 2 tablets by mouth daily at 6 (six) AM. 60 tablet 0   temozolomide  (TEMODAR ) 100 MG capsule Take 4 capsules (400 mg total) by mouth daily. Take for 5 days on, 23 days off. Repeat every 28 days. Take on an empty stomach 1 hour before or 2 hours after a meal. 20 capsule 0   triamcinolone  cream (KENALOG ) 0.1 % Apply 1 Application topically daily. Use on affected areas for 2 weeks, then stop for 2 weeks. Repeat until clear if needed. 453 g 0   triamterene -hydrochlorothiazide  (MAXZIDE -25) 37.5-25 MG tablet Take 1 tablet by mouth daily. 90 tablet 3   zolpidem  (AMBIEN ) 10 MG tablet TAKE 1 TABLET BY MOUTH EVERYDAY AT BEDTIME 90 tablet 1   No facility-administered medications prior to visit.    ROS: Review of Systems  Constitutional:  Negative for appetite change, fatigue and unexpected weight change.  HENT:  Negative  for congestion, nosebleeds, sneezing, sore throat and trouble swallowing.   Eyes:  Negative for itching and visual disturbance.  Respiratory:  Negative for cough.   Cardiovascular:  Negative for chest pain, palpitations and leg swelling.  Gastrointestinal:  Negative for abdominal distention, blood in stool, diarrhea and nausea.  Genitourinary:  Negative for frequency and hematuria.  Musculoskeletal:  Positive for arthralgias. Negative for back pain, gait problem, joint swelling and neck pain.  Skin:  Negative for rash.  Neurological:  Negative for dizziness, tremors, speech difficulty and weakness.  Psychiatric/Behavioral:  Negative for agitation, dysphoric mood and sleep disturbance. The patient is not nervous/anxious.     Objective:  BP  106/78   Pulse 72   Temp 98.4 F (36.9 C)   Ht 6' 2 (1.88 m)   Wt 276 lb 12.8 oz (125.6 kg)   SpO2 99%   BMI 35.54 kg/m   BP Readings from Last 3 Encounters:  10/31/24 106/78  10/22/24 (!) 127/98  10/07/24 (!) 132/92    Wt Readings from Last 3 Encounters:  10/31/24 276 lb 12.8 oz (125.6 kg)  10/22/24 273 lb 14.4 oz (124.2 kg)  10/07/24 276 lb (125.2 kg)    Physical Exam Constitutional:      General: He is not in acute distress.    Appearance: He is well-developed. He is obese.     Comments: NAD  Eyes:     Conjunctiva/sclera: Conjunctivae normal.     Pupils: Pupils are equal, round, and reactive to light.  Neck:     Thyroid : No thyromegaly.     Vascular: No JVD.  Cardiovascular:     Rate and Rhythm: Normal rate and regular rhythm.     Heart sounds: Normal heart sounds. No murmur heard.    No friction rub. No gallop.  Pulmonary:     Effort: Pulmonary effort is normal. No respiratory distress.     Breath sounds: Normal breath sounds. No wheezing or rales.  Chest:     Chest wall: No tenderness.  Abdominal:     General: Bowel sounds are normal. There is no distension.     Palpations: Abdomen is soft. There is no mass.     Tenderness: There is no abdominal tenderness. There is no guarding or rebound.  Musculoskeletal:        General: Tenderness present. Normal range of motion.     Cervical back: Normal range of motion.  Lymphadenopathy:     Cervical: No cervical adenopathy.  Skin:    General: Skin is warm and dry.     Findings: No rash.  Neurological:     Mental Status: He is alert and oriented to person, place, and time.     Cranial Nerves: No cranial nerve deficit.     Motor: No abnormal muscle tone.     Coordination: Coordination normal.     Gait: Gait normal.     Deep Tendon Reflexes: Reflexes are normal and symmetric.  Psychiatric:        Behavior: Behavior normal.        Thought Content: Thought content normal.        Judgment: Judgment normal.    Right shoulder is tender to palpation over subacromial bursa and over bicipital tendon.  Range of motion is restricted due to pain. Muscle strength seems to be intact  Lab Results  Component Value Date   WBC 3.7 (L) 10/22/2024   HGB 15.8 10/22/2024   HCT 45.1 10/22/2024   PLT 205 10/22/2024  GLUCOSE 158 (H) 10/22/2024   CHOL 171 01/19/2023   TRIG 117.0 01/19/2023   HDL 29.10 (L) 01/19/2023   LDLDIRECT 116.0 10/26/2020   LDLCALC 119 (H) 01/19/2023   ALT 44 10/22/2024   AST 37 10/22/2024   NA 138 10/22/2024   K 3.3 (L) 10/22/2024   CL 105 10/22/2024   CREATININE 1.33 (H) 10/22/2024   BUN 16 10/22/2024   CO2 25 10/22/2024   TSH 0.715 03/12/2023   PSA 1.01 01/19/2023   INR 1.0 12/15/2023   HGBA1C 5.4 12/15/2023    MR BRAIN W WO CONTRAST Result Date: 09/06/2024 EXAM: MRI BRAIN WITH AND WITHOUT CONTRAST 09/06/2024 10:06:10 AM TECHNIQUE: Multiplanar multisequence MRI of the head/brain was performed with and without the administration of intravenous contrast. COMPARISON: MRI head 07/02/2024 CLINICAL HISTORY: Brain/CNS neoplasm, assess treatment response. Brain/CNS neoplasm, assess treatment response, Frontal glioblastoma. FINDINGS: BRAIN AND VENTRICLES: Postsurgical changes of right frontoparietal craniotomy redemonstrated. Resection cavity centered in the right superior frontal gyrus partially extending into the right cingulate gyrus. The resection cavity measures 1.7 x 3.2 x 3.4 cm, previously measuring 1.9 x 3.0 x 3.4 cm. There is decreased curvilinear enhancement along the margin of the resection cavity. There has been further interval decrease in edema surrounding the resection cavity. Edema along the lateral and posterolateral aspects within the centrum semiovale are significantly decreased. There is also slightly decreased edema within the anterior right frontal lobe and right insular cortex. Susceptibility along the margin of the resection cavity is similar to slightly increased.  Similar interval restricted diffusion suggestive of necrotic/devitalized tissue. Several areas of heterogeneous intrinsic T1 hyperintensity within the resection cavity. No evidence of acute infarct. No midline shift. ORBITS: No acute abnormality. SINUSES: No acute abnormality. BONES AND SOFT TISSUES: Normal bone marrow signal and enhancement. No acute soft tissue abnormality. CONTRAST: 10 mL of gadavist  given. IMPRESSION: 1. Interval decrease in curvilinear enhancement along the margin of the resection cavity in the right frontal lobe with decreased edema as described above. 2. No new intracranial lesions. Electronically signed by: Donnice Mania MD 09/06/2024 11:34 AM EDT RP Workstation: [id number]    Assessment & Plan:   Problem List Items Addressed This Visit     Acute pain of left shoulder - Primary   Acute pain in the left shoulder likely due to rotator cuff and bicipital tendon strain.  Options to treat were discussed.  Prescribed Celebrex 200 mg twice a day for a few days then once a day.  He is on pantoprazole  twice a day.  Anticoagulation was acknowledged.  Range of motion exercises, heat/ice. The patient denied physical therapy. Orthopedic surgery consultation      Relevant Orders   Ambulatory referral to Orthopedic Surgery   Chronic anticoagulation   On Eliquis .  Will use Celebrex with caution.  The patient is on Protonix .      GERD (gastroesophageal reflux disease)   On pantoprazole  twice daily.  Pantoprazole  should protect stomach lining from Celebrex potential side effect         Meds ordered this encounter  Medications   celecoxib (CELEBREX) 200 MG capsule    Sig: Take 1 po every day-bid pc prn pain    Dispense:  60 capsule    Refill:  1      Follow-up: Return for a follow-up visit.  Marolyn Noel, MD

## 2024-10-31 NOTE — Assessment & Plan Note (Signed)
 On Eliquis .  Will use Celebrex with caution.  The patient is on Protonix .

## 2024-11-07 ENCOUNTER — Ambulatory Visit (HOSPITAL_COMMUNITY)

## 2024-11-11 ENCOUNTER — Ambulatory Visit (HOSPITAL_COMMUNITY)
Admission: RE | Admit: 2024-11-11 | Discharge: 2024-11-11 | Disposition: A | Discharge status: Home / Self Care | Source: Ambulatory Visit | Attending: Internal Medicine | Admitting: Internal Medicine

## 2024-11-11 DIAGNOSIS — C711 Malignant neoplasm of frontal lobe: Secondary | ICD-10-CM | POA: Diagnosis present

## 2024-11-11 MED ORDER — GADOBUTROL 1 MMOL/ML IV SOLN
10.0000 mL | Freq: Once | INTRAVENOUS | Status: AC | PRN
  Administered 2024-11-11: 10 mL via INTRAVENOUS

## 2024-11-12 ENCOUNTER — Inpatient Hospital Stay (HOSPITAL_BASED_OUTPATIENT_CLINIC_OR_DEPARTMENT_OTHER): Admitting: Internal Medicine

## 2024-11-12 ENCOUNTER — Inpatient Hospital Stay

## 2024-11-12 ENCOUNTER — Inpatient Hospital Stay: Attending: Radiation Oncology

## 2024-11-12 VITALS — BP 124/85 | HR 64 | Temp 97.2°F | Resp 17 | Ht 74.0 in | Wt 275.0 lb

## 2024-11-12 DIAGNOSIS — C711 Malignant neoplasm of frontal lobe: Secondary | ICD-10-CM | POA: Diagnosis present

## 2024-11-12 DIAGNOSIS — Z79899 Other long term (current) drug therapy: Secondary | ICD-10-CM | POA: Diagnosis not present

## 2024-11-12 DIAGNOSIS — G40909 Epilepsy, unspecified, not intractable, without status epilepticus: Secondary | ICD-10-CM

## 2024-11-12 DIAGNOSIS — Z79630 Long term (current) use of alkylating agent: Secondary | ICD-10-CM | POA: Insufficient documentation

## 2024-11-12 LAB — CMP (CANCER CENTER ONLY)
ALT: 21 U/L (ref 0–44)
AST: 22 U/L (ref 15–41)
Albumin: 4.4 g/dL (ref 3.5–5.0)
Alkaline Phosphatase: 36 U/L — ABNORMAL LOW (ref 38–126)
Anion gap: 7 (ref 5–15)
BUN: 15 mg/dL (ref 6–20)
CO2: 27 mmol/L (ref 22–32)
Calcium: 9.6 mg/dL (ref 8.9–10.3)
Chloride: 106 mmol/L (ref 98–111)
Creatinine: 1.04 mg/dL (ref 0.61–1.24)
GFR, Estimated: >60 mL/min (ref >60)
Glucose, Bld: 97 mg/dL (ref 70–99)
Potassium: 3.6 mmol/L (ref 3.5–5.1)
Sodium: 140 mmol/L (ref 135–145)
Total Bilirubin: 0.7 mg/dL (ref 0.0–1.2)
Total Protein: 7.2 g/dL (ref 6.5–8.1)

## 2024-11-12 LAB — CBC WITH DIFFERENTIAL (CANCER CENTER ONLY)
Abs Immature Granulocytes: 0 K/uL (ref 0.00–0.07)
Basophils Absolute: 0 K/uL (ref 0.0–0.1)
Basophils Relative: 1 %
Eosinophils Absolute: 0.2 K/uL (ref 0.0–0.5)
Eosinophils Relative: 5 %
HCT: 45.4 % (ref 39.0–52.0)
Hemoglobin: 15.6 g/dL (ref 13.0–17.0)
Immature Granulocytes: 0 %
Lymphocytes Relative: 35 %
Lymphs Abs: 1 K/uL (ref 0.7–4.0)
MCH: 29.7 pg (ref 26.0–34.0)
MCHC: 34.4 g/dL (ref 30.0–36.0)
MCV: 86.5 fL (ref 80.0–100.0)
Monocytes Absolute: 0.3 K/uL (ref 0.1–1.0)
Monocytes Relative: 9 %
Neutro Abs: 1.5 K/uL — ABNORMAL LOW (ref 1.7–7.7)
Neutrophils Relative %: 50 %
Platelet Count: 176 K/uL (ref 150–400)
RBC: 5.25 MIL/uL (ref 4.22–5.81)
RDW: 14 % (ref 11.5–15.5)
WBC Count: 3 K/uL — ABNORMAL LOW (ref 4.0–10.5)
nRBC: 0 % (ref 0.0–0.2)

## 2024-11-12 LAB — TOTAL PROTEIN, URINE DIPSTICK: Protein, ur: NEGATIVE mg/dL

## 2024-11-12 MED ORDER — TEMOZOLOMIDE 100 MG PO CAPS: 100.0000 mg | ORAL_CAPSULE | Freq: Every day | ORAL | 0 refills | Status: AC

## 2024-11-12 NOTE — Progress Notes (Signed)
 [facility] [address]  [place], [place] [address] [phone]   Interval Evaluation  Date of Service: 11/12/24 Patient Name: Nicholas Stevens Patient MRN: [id number] Patient DOB: 1971/02/17 Provider: Arthea MARLA Manns, MD  Identifying Statement:  Nicholas Stevens is a 53 y.o. male with right frontal glioblastoma    Oncologic History: Oncology History  Frontal glioblastoma (HCC)  12/18/2023 Surgery   Craniotomy, resection of right frontal mass with Dr. Debby; path is glioblastoma, IDH pending   01/22/2024 - 01/22/2024 Chemotherapy   Patient is on Treatment Plan : BRAIN GLIOBLASTOMA Radiation Therapy With Concurrent Temozolomide  75 mg/m2 Daily Followed By Sequential Maintenance Temozolomide  x 6-12 cycles     06/11/2024 -  Chemotherapy   Patient is on Treatment Plan : BRAIN Temozolomide  D1-5 + Bevacizumab  (10) D1,15 q28d         Interval History: Nicholas Stevens presents today having completed cycle #6 5-day TMZ, recent MRI brain.  He did experience a brief focal seizure during period of physical and mental stress (home move).  Remains on Keppra  1000mg  twice per day.  Today he again describes considerable improvement in aches and pain in his shoulders knees, which he had noticed following avastin  treatments.  Last infusion was on 09/23/24.  He remains off decadron .  Denies new or progressive neurologic changes today.  Denies headaches.  Prior- presents for follow up after recent clinical changes.  He describes relatively rapid onset of left sided weakness starting ~10 days ago.  He was unable to walk and use the left arm meaningfully.  No seizure activity appreciated.  Decadron  was started 4mg  twice per day after 3-4 days of the weakness.  Once the steroid was started, he returned back to his prior baseline.  Continues on the Keppra .  Denies severe headaches.   H+P (01/08/24) Patient presented with several days of left sided  clumsiness, impaired coordination.  Was found to have large right frontal mass, c/w primary brain tumor.  He underwent craniotomy, resection with Dr. Debby on 12/18/23; path demonstrated glioblastoma.  Following surgery, he had persistent right sided weakness, he is currently in outpatient PT following discharge from rehab.  No seizures, headaches.     Medications: Current Outpatient Medications on File Prior to Visit  Medication Sig Dispense Refill   apixaban  (ELIQUIS ) 2.5 MG TABS tablet Take 1 tablet (2.5 mg total) by mouth 2 (two) times daily. 60 tablet 2   Baclofen  5 MG TABS Take 1 tablet (5 mg total) by mouth every 6 (six) hours as needed (hiccups). 90 tablet 1   camphor-menthol  (SARNA) lotion Apply topically as needed for itching. 222 mL 0   celecoxib (CELEBREX) 200 MG capsule TAKE 1 CAPSULE BY MOUTH 1 TO 2 TIMES DAILY AFTER A MEAL AS NEEDED FOR PAIN 180 capsule 0   chlorhexidine  (PERIDEX ) 0.12 % solution Use as directed 15 mLs in the mouth or throat 3 (three) times daily.     Cyanocobalamin  (B-12 PO) Take 1 tablet by mouth daily.     fluconazole  (DIFLUCAN ) 100 MG tablet Take 2 tabs on day#1, then 1 tab daily on Days #2-10 11 tablet 1   furosemide  (LASIX ) 20 MG tablet Take 1 tablet (20 mg total) by mouth daily as needed. 30 tablet 0   latanoprost  (XALATAN ) 0.005 % ophthalmic solution INSTILL 1 DROP IN BOTH EYES AT BEDTIME 2.5 mL 2   levETIRAcetam  (KEPPRA ) 500 MG tablet TAKE 2 TABLETS(1000 MG) BY MOUTH TWICE DAILY 120  tablet 2   magic mouthwash (nystatin , lidocaine , diphenhydrAMINE , alum & mag hydroxide) suspension Swish and swallow 5 mLs by mouth 4 (four) times daily as needed for mouth pain. 140 mL 1   Multiple Vitamin (MULTIVITAMIN WITH MINERALS) TABS tablet Take 1 tablet by mouth daily.     olmesartan  (BENICAR ) 20 MG tablet Take 1 tablet (20 mg total) by mouth daily. 90 tablet 3   Omega-3 Fatty Acids (FISH OIL) 1000 MG CAPS Take 1,000 mg by mouth daily.     pantoprazole  (PROTONIX ) 40 MG  tablet Take 1 tablet (40 mg total) by mouth 2 (two) times daily. 180 tablet 3   pimecrolimus  (ELIDEL ) 1 % cream Apply 1 Application topically 2 (two) times daily as needed (Dermatitis).     senna-docusate (SENOKOT-S) 8.6-50 MG tablet Take 2 tablets by mouth daily at 6 (six) AM. 60 tablet 0   temozolomide  (TEMODAR ) 100 MG capsule Take 4 capsules (400 mg total) by mouth daily. Take for 5 days on, 23 days off. Repeat every 28 days. Take on an empty stomach 1 hour before or 2 hours after a meal. 20 capsule 0   triamcinolone  cream (KENALOG ) 0.1 % Apply 1 Application topically daily. Use on affected areas for 2 weeks, then stop for 2 weeks. Repeat until clear if needed. 453 g 0   triamterene -hydrochlorothiazide  (MAXZIDE -25) 37.5-25 MG tablet Take 1 tablet by mouth daily. 90 tablet 3   zolpidem  (AMBIEN ) 10 MG tablet TAKE 1 TABLET BY MOUTH EVERYDAY AT BEDTIME 90 tablet 1   No current facility-administered medications on file prior to visit.    Allergies: No Known Allergies Past Medical History:  Past Medical History:  Diagnosis Date   Arthritis    DDD (degenerative disc disease), lumbar    HNP (herniated nucleus pulposus)    Hypertension    Liver hemangioma    Morbid obesity (HCC)    OSA (obstructive sleep apnea)    no cpap used since weight loss   Overweight(278.02)    Plantar fasciitis of right foot    Sleep apnea    Tinea barbae    Past Surgical History:  Past Surgical History:  Procedure Laterality Date   APPENDECTOMY     BREATH TEK H PYLORI N/A 08/20/2013   Procedure: BREATH TEK H PYLORI;  Surgeon: Donnice KATHEE Lunger, MD;  Location: [facility];  Service: General;  Laterality: N/A;   COLONOSCOPY WITH PROPOFOL  N/A 05/05/2022   Procedure: COLONOSCOPY WITH PROPOFOL ;  Surgeon: Albertus Gordy HERO, MD;  Location: [facility];  Service: Gastroenterology;  Laterality: N/A;   CRANIOTOMY Right 12/18/2023   Procedure: Right Frontal Stereotactic Craniotomy for Resection of Tumor;  Surgeon: Debby Dorn MATSU, MD;  Location: [facility] OR;  Service: Neurosurgery;  Laterality: Right;   ESOPHAGOGASTRODUODENOSCOPY (EGD) WITH PROPOFOL  N/A 03/15/2023   Procedure: ESOPHAGOGASTRODUODENOSCOPY (EGD) WITH PROPOFOL ;  Surgeon: Avram Lupita BRAVO, MD;  Location: [facility];  Service: Gastroenterology;  Laterality: N/A;   LAPAROSCOPIC APPENDECTOMY  12/26/2005   Dr Curvin   LAPAROSCOPIC GASTRIC SLEEVE RESECTION N/A 10/08/2013   Procedure: LAPAROSCOPIC SLEEVE GASTRECTOMY;  Surgeon: Donnice KATHEE Lunger, MD;  Location: [facility];  Service: General;  Laterality: N/A;   LUMBAR LAMINECTOMY/DECOMPRESSION MICRODISCECTOMY N/A 09/16/2015   Procedure: MICRO LUMBAR DECOMPRESSION, MICRODISCECTOMY L5 - S1;  Surgeon: Reyes Billing, MD;  Location: [facility];  Service: Orthopedics;  Laterality: N/A;   POLYPECTOMY  05/05/2022   Procedure: POLYPECTOMY;  Surgeon: Albertus Gordy HERO, MD;  Location: [facility];  Service: Gastroenterology;;   MARCELINE CUFF  REPAIR Right 12/27/2003   Dr Harden   SPINE SURGERY     Social History:  Social History   Socioeconomic History   Marital status: Married    Spouse name: Ely Ballen   Number of children: 3   Years of education: Not on file   Highest education level: 12th grade  Occupational History   Occupation: Doctor, General Practice PD    Employer: UNEMPLOYED  Tobacco Use   Smoking status: Never    Passive exposure: Never   Smokeless tobacco: Never  Vaping Use   Vaping status: Never Used  Substance and Sexual Activity   Alcohol  use: No   Drug use: No   Sexual activity: Not on file  Other Topics Concern   Not on file  Social History Narrative   Married 18+ years   3 children - ages approx 84, 42 and 5...daughter age 30 w/ DM type 1   Social Drivers of Corporate Investment Banker Strain: Low Risk  (04/02/2023)   Overall Financial Resource Strain (CARDIA)    Difficulty of Paying Living Expenses: Not hard at all  Food Insecurity: No Food Insecurity (12/18/2023)   Hunger Vital Sign    Worried About Running Out  of Food in the Last Year: Never true    Ran Out of Food in the Last Year: Never true  Transportation Needs: No Transportation Needs (12/18/2023)   PRAPARE - Administrator, Civil Service (Medical): No    Lack of Transportation (Non-Medical): No  Physical Activity: Insufficiently Active (04/02/2023)   Exercise Vital Sign    Days of Exercise per Week: 4 days    Minutes of Exercise per Session: 30 min  Stress: No Stress Concern Present (04/02/2023)   [employer] of Occupational Health - Occupational Stress Questionnaire    Feeling of Stress : Not at all  Social Connections: Socially Integrated (04/02/2023)   Social Connection and Isolation Panel    Frequency of Communication with Friends and Family: More than three times a week    Frequency of Social Gatherings with Friends and Family: Once a week    Attends Religious Services: More than 4 times per year    Active Member of [employer] or Organizations: Yes    Attends Engineer, Structural: More than 4 times per year    Marital Status: Married  Catering Manager Violence: Not At Risk (12/18/2023)   Humiliation, Afraid, Rape, and Kick questionnaire    Fear of Current or Ex-Partner: No    Emotionally Abused: No    Physically Abused: No    Sexually Abused: No   Family History:  Family History  Problem Relation Age of Onset   Other Mother        hx of DJD   Obesity Mother    Liver disease Father        ETOH, Hep C   Hypertension Father    Diabetes Father    Early death Father    Obesity Father    Colon cancer Neg Hx    Colon polyps Neg Hx    Esophageal cancer Neg Hx    Rectal cancer Neg Hx    Stomach cancer Neg Hx     Review of Systems: Constitutional: Doesn't report fevers, chills or abnormal weight loss Eyes: Doesn't report blurriness of vision Ears, nose, mouth, throat, and face: Doesn't report sore throat Respiratory: Doesn't report cough, dyspnea or wheezes Cardiovascular: Doesn't report palpitation,  chest discomfort  Gastrointestinal:  Doesn't report nausea, constipation,  diarrhea GU: Doesn't report incontinence Skin: Doesn't report skin rashes Neurological: Per HPI Musculoskeletal: Doesn't report joint pain Behavioral/Psych: Doesn't report anxiety  Physical Exam: Vitals:   11/12/24 1123  BP: 124/85  Pulse: 64  Resp: 17  Temp: (!) 97.2 F (36.2 C)  SpO2: 100%     KPS: 70. General: Alert, cooperative, pleasant, in no acute distress Head: Normal EENT: No conjunctival injection or scleral icterus.  Lungs: Resp effort normal Cardiac: Regular rate Abdomen: Non-distended abdomen Skin: No rashes cyanosis or petechiae. Extremities: No clubbing or edema  Neurologic Exam: Mental Status: Awake, alert, attentive to examiner. Oriented to self and environment. Language is fluent with intact comprehension.  Cranial Nerves: Visual acuity is grossly normal. Visual fields are full. Extra-ocular movements intact. No ptosis. Face is symmetric Motor: Tone and bulk are normal. Power is 4+/5 in left arm and 4/5 leg. Reflexes are symmetric, no pathologic reflexes present.  Sensory: Intact to light touch Gait: Hemiparetic   Labs: I have reviewed the data as listed    Component Value Date/Time   NA 140 11/12/2024 1036   K 3.6 11/12/2024 1036   CL 106 11/12/2024 1036   CO2 27 11/12/2024 1036   GLUCOSE 97 11/12/2024 1036   BUN 15 11/12/2024 1036   CREATININE 1.04 11/12/2024 1036   CALCIUM 9.6 11/12/2024 1036   PROT 7.2 11/12/2024 1036   ALBUMIN 4.4 11/12/2024 1036   AST 22 11/12/2024 1036   ALT 21 11/12/2024 1036   ALKPHOS 36 (L) 11/12/2024 1036   BILITOT 0.7 11/12/2024 1036   GFRNONAA >60 11/12/2024 1036   GFRAA >60 09/15/2015 1520   Lab Results  Component Value Date   WBC 3.0 (L) 11/12/2024   NEUTROABS 1.5 (L) 11/12/2024   HGB 15.6 11/12/2024   HCT 45.4 11/12/2024   MCV 86.5 11/12/2024   PLT 176 11/12/2024   Imaging:  [facility] Clinician Interpretation: I have personally  reviewed the CNS images as listed.  My interpretation, in the context of the patient's clinical presentation, is stable disease  MR BRAIN W WO CONTRAST Result Date: 11/11/2024 EXAM: MRI BRAIN WITH AND WITHOUT CONTRAST 11/11/2024 01:18:53 PM TECHNIQUE: Multiplanar multisequence MRI of the head/brain was performed with and without the administration of intravenous contrast. COMPARISON: MRI head  09/06/24 CLINICAL HISTORY: Brain/CNS neoplasm, assess treatment response FINDINGS: BRAIN AND VENTRICLES: No substantial change in enhancement along the margins of the resection cavity in the right front lobe. No evidence of new enhancement. Similar intrinsic T1 hyperintensity and susceptibility artifact compatible with prior hemorrhage. Similar surrounding T2 hyperintensity. No significant mass effect. No acute infarct. No acute intracranial hemorrhage. No hydrocephalus. Normal flow voids. ORBITS: No acute abnormality. SINUSES: No acute abnormality. BONES AND SOFT TISSUES: Normal bone marrow signal and enhancement. No acute soft tissue abnormality. IMPRESSION: 1. No substantial change in enhancement along the margins of the resection cavity in the right front lobe with similar surrounding T2 hyperintensity. 2. No progressive findings or new acute abnormality. Electronically signed by: Gilmore Molt MD 11/11/2024 02:22 PM EST RP Workstation: [id number]   Assessment/Plan Frontal glioblastoma (HCC)  Seizure disorder (HCC)  Nicholas Stevens is clinically stable today, now having completed cycle #6 Temodar  with concurrent avastin .  MRI brain demonstrates stable findings, now beyond avastin  washout period.    We recommended continuing treatment with cycle #7 Temozolomide , 150 mg/m2, on for five days and off for twenty three days in twenty eight day cycles. The patient will have a complete blood count performed on days 21  and 28 of each cycle, and a comprehensive metabolic panel performed on day 28 of each cycle.  Labs may need to be performed more often. Zofran  will prescribed for home use for nausea/vomiting.   Chemotherapy should be held for the following:  ANC less than 1,000  Platelets less than 100,000  LFT or creatinine greater than 2x ULN  If clinical concerns/contraindications develop   Discussed and recommended holding Avastin  indefinitely due to arthralgias, physician and patient preference.  Decadron  should remain off if tolerated.  Keppra  will keep at 1000mg  BID due to provoked nature of recent focal seizure, stable imaging.  Recommended continuing reduced dose Eliquis  to 2.5mg  BID.  We ask that [employer] return to clinic in 4 weeks with labs prior to cycle #8, or sooner if needed.  All questions were answered. The patient knows to call the clinic with any problems, questions or concerns. No barriers to learning were detected.  The total time spent in the encounter was 40 minutes and more than 50% was on counseling and review of test results   Arthea MARLA Manns, MD Medical Director of Neuro-Oncology [facility] at [facility] 11/12/24 11:34 AM

## 2024-11-13 ENCOUNTER — Telehealth: Payer: Self-pay | Admitting: Internal Medicine

## 2024-11-13 NOTE — Telephone Encounter (Signed)
 Scheduled patient for next appointment. Called and spoke with the patient, he is aware.

## 2024-11-18 ENCOUNTER — Other Ambulatory Visit: Payer: Self-pay | Admitting: Internal Medicine

## 2024-11-18 ENCOUNTER — Encounter: Payer: Self-pay | Admitting: Internal Medicine

## 2024-11-18 DIAGNOSIS — C711 Malignant neoplasm of frontal lobe: Secondary | ICD-10-CM

## 2024-11-18 MED ORDER — TEMOZOLOMIDE 100 MG PO CAPS: 150.0000 mg/m2/d | ORAL_CAPSULE | Freq: Every day | ORAL | 0 refills | Status: AC

## 2024-12-04 ENCOUNTER — Encounter: Payer: Self-pay | Admitting: Internal Medicine

## 2024-12-04 ENCOUNTER — Other Ambulatory Visit: Payer: Self-pay | Admitting: Internal Medicine

## 2024-12-04 ENCOUNTER — Other Ambulatory Visit: Payer: Self-pay | Admitting: *Deleted

## 2024-12-04 MED ORDER — LEVETIRACETAM 500 MG PO TABS: 500.0000 mg | ORAL_TABLET | Freq: Two times a day (BID) | ORAL | 2 refills | Status: DC

## 2024-12-07 ENCOUNTER — Encounter: Payer: Self-pay | Admitting: Internal Medicine

## 2024-12-08 ENCOUNTER — Other Ambulatory Visit: Payer: Self-pay | Admitting: Internal Medicine

## 2024-12-08 DIAGNOSIS — C711 Malignant neoplasm of frontal lobe: Secondary | ICD-10-CM

## 2024-12-09 ENCOUNTER — Other Ambulatory Visit: Payer: Self-pay | Admitting: Family

## 2024-12-09 DIAGNOSIS — G4700 Insomnia, unspecified: Secondary | ICD-10-CM

## 2024-12-09 MED ORDER — PANTOPRAZOLE SODIUM 40 MG PO TBEC: 40.0000 mg | DELAYED_RELEASE_TABLET | Freq: Two times a day (BID) | ORAL | 3 refills | Status: DC

## 2024-12-09 MED ORDER — ZOLPIDEM TARTRATE 10 MG PO TABS: ORAL_TABLET | ORAL | 1 refills | Status: AC

## 2024-12-10 ENCOUNTER — Inpatient Hospital Stay: Admitting: Internal Medicine

## 2024-12-10 ENCOUNTER — Inpatient Hospital Stay: Attending: Radiation Oncology

## 2024-12-10 ENCOUNTER — Telehealth: Payer: Self-pay | Admitting: Internal Medicine

## 2024-12-10 ENCOUNTER — Inpatient Hospital Stay: Admitting: Licensed Clinical Social Worker

## 2024-12-10 VITALS — BP 112/93 | HR 85 | Temp 97.2°F | Resp 17 | Ht 74.0 in | Wt 269.0 lb

## 2024-12-10 DIAGNOSIS — R569 Unspecified convulsions: Secondary | ICD-10-CM | POA: Insufficient documentation

## 2024-12-10 DIAGNOSIS — C711 Malignant neoplasm of frontal lobe: Secondary | ICD-10-CM

## 2024-12-10 DIAGNOSIS — Z79899 Other long term (current) drug therapy: Secondary | ICD-10-CM | POA: Insufficient documentation

## 2024-12-10 LAB — CMP (CANCER CENTER ONLY)
ALT: 30 U/L (ref 0–44)
AST: 28 U/L (ref 15–41)
Albumin: 4.7 g/dL (ref 3.5–5.0)
Alkaline Phosphatase: 48 U/L (ref 38–126)
Anion gap: 9 (ref 5–15)
BUN: 14 mg/dL (ref 6–20)
CO2: 29 mmol/L (ref 22–32)
Calcium: 9.9 mg/dL (ref 8.9–10.3)
Chloride: 101 mmol/L (ref 98–111)
Creatinine: 1.32 mg/dL — ABNORMAL HIGH (ref 0.61–1.24)
GFR, Estimated: >60 mL/min (ref >60)
Glucose, Bld: 108 mg/dL — ABNORMAL HIGH (ref 70–99)
Potassium: 3.8 mmol/L (ref 3.5–5.1)
Sodium: 139 mmol/L (ref 135–145)
Total Bilirubin: 0.7 mg/dL (ref 0.0–1.2)
Total Protein: 7.9 g/dL (ref 6.5–8.1)

## 2024-12-10 LAB — CBC WITH DIFFERENTIAL (CANCER CENTER ONLY)
Abs Immature Granulocytes: 0 K/uL (ref 0.00–0.07)
Basophils Absolute: 0 K/uL (ref 0.0–0.1)
Basophils Relative: 1 %
Eosinophils Absolute: 0.1 K/uL (ref 0.0–0.5)
Eosinophils Relative: 4 %
HCT: 46.7 % (ref 39.0–52.0)
Hemoglobin: 15.9 g/dL (ref 13.0–17.0)
Immature Granulocytes: 0 %
Lymphocytes Relative: 37 %
Lymphs Abs: 1.2 K/uL (ref 0.7–4.0)
MCH: 29.5 pg (ref 26.0–34.0)
MCHC: 34 g/dL (ref 30.0–36.0)
MCV: 86.6 fL (ref 80.0–100.0)
Monocytes Absolute: 0.3 K/uL (ref 0.1–1.0)
Monocytes Relative: 9 %
Neutro Abs: 1.6 K/uL — ABNORMAL LOW (ref 1.7–7.7)
Neutrophils Relative %: 49 %
Platelet Count: 231 K/uL (ref 150–400)
RBC: 5.39 MIL/uL (ref 4.22–5.81)
RDW: 14 % (ref 11.5–15.5)
WBC Count: 3.2 K/uL — ABNORMAL LOW (ref 4.0–10.5)
nRBC: 0 % (ref 0.0–0.2)

## 2024-12-10 LAB — TSH: TSH: 1.52 u[IU]/mL (ref 0.350–4.500)

## 2024-12-10 MED ORDER — LEVETIRACETAM 500 MG PO TABS: 1500.0000 mg | ORAL_TABLET | Freq: Two times a day (BID) | ORAL | 3 refills | Status: DC

## 2024-12-10 MED ORDER — TEMOZOLOMIDE 100 MG PO CAPS: 150.0000 mg/m2/d | ORAL_CAPSULE | Freq: Every day | ORAL | 0 refills | Status: AC

## 2024-12-10 NOTE — Progress Notes (Signed)
 [facility] at [facility] [address]  [place], [place] [address] [phone]   Interval Evaluation  Date of Service: 12/10/2024 Patient Name: Nicholas Stevens Patient MRN: [id number] Patient DOB: Mar 23, 1971 Provider: Arthea MARLA Manns, MD  Identifying Statement:  Nicholas Stevens is a 53 y.o. male with right frontal glioblastoma    Oncologic History: Oncology History  Frontal glioblastoma (HCC)  12/18/2023 Surgery   Craniotomy, resection of right frontal mass with Dr. Debby; path is glioblastoma, IDH pending   01/22/2024 - 01/22/2024 Chemotherapy   Patient is on Treatment Plan : BRAIN GLIOBLASTOMA Radiation Therapy With Concurrent Temozolomide  75 mg/m2 Daily Followed By Sequential Maintenance Temozolomide  x 6-12 cycles     06/11/2024 -  Chemotherapy   Patient is on Treatment Plan : BRAIN Temozolomide  D1-5 + Bevacizumab  (10) D1,15 q28d         Interval History: Nicholas Stevens presents today having dosed cycle #7 5-day TMZ 12/6-12/10.  He did experience a focal seizure this past weekend, typical semiology.  Remains on Keppra  1000mg  twice per day.  Today he again describes ongoing improvement in aches and pain in his shoulders knees, which he had noticed following avastin  treatments.  Last infusion was on 09/23/24.  He remains off decadron .  Denies new or progressive neurologic changes today.  Denies headaches.  Prior- presents for follow up after recent clinical changes.  He describes relatively rapid onset of left sided weakness starting ~10 days ago.  He was unable to walk and use the left arm meaningfully.  No seizure activity appreciated.  Decadron  was started 4mg  twice per day after 3-4 days of the weakness.  Once the steroid was started, he returned back to his prior baseline.  Continues on the Keppra .  Denies severe headaches.   H+P (01/08/24) Patient presented with several days of left sided clumsiness, impaired coordination.  Was found  to have large right frontal mass, c/w primary brain tumor.  He underwent craniotomy, resection with Dr. Debby on 12/18/23; path demonstrated glioblastoma.  Following surgery, he had persistent right sided weakness, he is currently in outpatient PT following discharge from rehab.  No seizures, headaches.     Medications: Current Outpatient Medications on File Prior to Visit  Medication Sig Dispense Refill   apixaban  (ELIQUIS ) 2.5 MG TABS tablet Take 1 tablet (2.5 mg total) by mouth 2 (two) times daily. 60 tablet 2   Baclofen  5 MG TABS Take 1 tablet (5 mg total) by mouth every 6 (six) hours as needed (hiccups). 90 tablet 1   camphor-menthol  (SARNA) lotion Apply topically as needed for itching. 222 mL 0   celecoxib  (CELEBREX ) 200 MG capsule TAKE 1 CAPSULE BY MOUTH 1 TO 2 TIMES DAILY AFTER A MEAL AS NEEDED FOR PAIN 180 capsule 0   chlorhexidine  (PERIDEX ) 0.12 % solution Use as directed 15 mLs in the mouth or throat 3 (three) times daily.     Cyanocobalamin  (B-12 PO) Take 1 tablet by mouth daily.     fluconazole  (DIFLUCAN ) 100 MG tablet Take 2 tabs on day#1, then 1 tab daily on Days #2-10 11 tablet 1   furosemide  (LASIX ) 20 MG tablet Take 1 tablet (20 mg total) by mouth daily as needed. 30 tablet 0   latanoprost  (XALATAN ) 0.005 % ophthalmic solution INSTILL 1 DROP IN BOTH EYES AT BEDTIME 2.5 mL 2   levETIRAcetam  (KEPPRA ) 500 MG tablet Take 1 tablet (500 mg total) by mouth 2 (two) times daily. 120 tablet 2  magic mouthwash (nystatin , lidocaine , diphenhydrAMINE , alum & mag hydroxide) suspension Swish and swallow 5 mLs by mouth 4 (four) times daily as needed for mouth pain. 140 mL 1   Multiple Vitamin (MULTIVITAMIN WITH MINERALS) TABS tablet Take 1 tablet by mouth daily.     olmesartan  (BENICAR ) 20 MG tablet Take 1 tablet (20 mg total) by mouth daily. 90 tablet 3   Omega-3 Fatty Acids (FISH OIL) 1000 MG CAPS Take 1,000 mg by mouth daily.     pantoprazole  (PROTONIX ) 40 MG tablet Take 1 tablet (40 mg  total) by mouth 2 (two) times daily. 180 tablet 3   pimecrolimus  (ELIDEL ) 1 % cream Apply 1 Application topically 2 (two) times daily as needed (Dermatitis).     senna-docusate (SENOKOT-S) 8.6-50 MG tablet Take 2 tablets by mouth daily at 6 (six) AM. 60 tablet 0   temozolomide  (TEMODAR ) 100 MG capsule Take 4 capsules (400 mg total) by mouth daily. Take for 5 days on, 23 days off. Repeat every 28 days. Take on an empty stomach 1 hour before or 2 hours after a meal. 20 capsule 0   temozolomide  (TEMODAR ) 100 MG capsule Take 1 capsule (100 mg total) by mouth daily. Take for 5 days on, 23 days off. Repeat every 28 days. Take on an empty stomach 1 hour before or 2 hours after a meal. 5 capsule 0   temozolomide  (TEMODAR ) 100 MG capsule Take 4 capsules (400 mg total) by mouth daily. Take for 5 days on, 23 days off. Repeat every 28 days. Take on an empty stomach 1 hour before or 2 hours after a meal. 20 capsule 0   triamcinolone  cream (KENALOG ) 0.1 % Apply 1 Application topically daily. Use on affected areas for 2 weeks, then stop for 2 weeks. Repeat until clear if needed. 453 g 0   triamterene -hydrochlorothiazide  (MAXZIDE -25) 37.5-25 MG tablet Take 1 tablet by mouth daily. 90 tablet 3   zolpidem  (AMBIEN ) 10 MG tablet TAKE 1 TABLET BY MOUTH EVERYDAY AT BEDTIME 90 tablet 1   No current facility-administered medications on file prior to visit.    Allergies: No Known Allergies Past Medical History:  Past Medical History:  Diagnosis Date   Arthritis    DDD (degenerative disc disease), lumbar    HNP (herniated nucleus pulposus)    Hypertension    Liver hemangioma    Morbid obesity (HCC)    OSA (obstructive sleep apnea)    no cpap used since weight loss   Overweight(278.02)    Plantar fasciitis of right foot    Sleep apnea    Tinea barbae    Past Surgical History:  Past Surgical History:  Procedure Laterality Date   APPENDECTOMY     BREATH TEK H PYLORI N/A 08/20/2013   Procedure: BREATH TEK H  PYLORI;  Surgeon: Donnice KATHEE Lunger, MD;  Location: [facility];  Service: General;  Laterality: N/A;   COLONOSCOPY WITH PROPOFOL  N/A 05/05/2022   Procedure: COLONOSCOPY WITH PROPOFOL ;  Surgeon: Albertus Gordy HERO, MD;  Location: [facility];  Service: Gastroenterology;  Laterality: N/A;   CRANIOTOMY Right 12/18/2023   Procedure: Right Frontal Stereotactic Craniotomy for Resection of Tumor;  Surgeon: Debby Dorn MATSU, MD;  Location: [facility];  Service: Neurosurgery;  Laterality: Right;   ESOPHAGOGASTRODUODENOSCOPY (EGD) WITH PROPOFOL  N/A 03/15/2023   Procedure: ESOPHAGOGASTRODUODENOSCOPY (EGD) WITH PROPOFOL ;  Surgeon: Avram Lupita BRAVO, MD;  Location: [facility];  Service: Gastroenterology;  Laterality: N/A;   LAPAROSCOPIC APPENDECTOMY  12/26/2005   Dr Curvin  LAPAROSCOPIC GASTRIC SLEEVE RESECTION N/A 10/08/2013   Procedure: LAPAROSCOPIC SLEEVE GASTRECTOMY;  Surgeon: Donnice KATHEE Lunger, MD;  Location: [facility];  Service: General;  Laterality: N/A;   LUMBAR LAMINECTOMY/DECOMPRESSION MICRODISCECTOMY N/A 09/16/2015   Procedure: MICRO LUMBAR DECOMPRESSION, MICRODISCECTOMY L5 - S1;  Surgeon: Reyes Billing, MD;  Location: [facility];  Service: Orthopedics;  Laterality: N/A;   POLYPECTOMY  05/05/2022   Procedure: POLYPECTOMY;  Surgeon: Albertus Gordy HERO, MD;  Location: [facility];  Service: Gastroenterology;;   ROTATOR CUFF REPAIR Right 12/27/2003   Dr Harden   SPINE SURGERY     Social History:  Social History   Socioeconomic History   Marital status: Married    Spouse name: Trason Shifflet   Number of children: 3   Years of education: Not on file   Highest education level: 12th grade  Occupational History   Occupation: Doctor, General Practice PD    Employer: UNEMPLOYED  Tobacco Use   Smoking status: Never    Passive exposure: Never   Smokeless tobacco: Never  Vaping Use   Vaping status: Never Used  Substance and Sexual Activity   Alcohol  use: No   Drug use: No   Sexual activity: Not on file  Other Topics Concern   Not  on file  Social History Narrative   Married 18+ years   3 children - ages approx 110, 9 and 5...daughter age 87 w/ DM type 1   Social Drivers of Health   Tobacco Use: Low Risk (10/31/2024)   Patient History    Smoking Tobacco Use: Never    Smokeless Tobacco Use: Never    Passive Exposure: Never  Financial Resource Strain: Low Risk (04/02/2023)   Overall Financial Resource Strain (CARDIA)    Difficulty of Paying Living Expenses: Not hard at all  Food Insecurity: No Food Insecurity (12/18/2023)   Hunger Vital Sign    Worried About Running Out of Food in the Last Year: Never true    Ran Out of Food in the Last Year: Never true  Transportation Needs: No Transportation Needs (12/18/2023)   PRAPARE - Administrator, Civil Service (Medical): No    Lack of Transportation (Non-Medical): No  Physical Activity: Insufficiently Active (04/02/2023)   Exercise Vital Sign    Days of Exercise per Week: 4 days    Minutes of Exercise per Session: 30 min  Stress: No Stress Concern Present (04/02/2023)   [employer] of Occupational Health - Occupational Stress Questionnaire    Feeling of Stress : Not at all  Social Connections: Socially Integrated (04/02/2023)   Social Connection and Isolation Panel    Frequency of Communication with Friends and Family: More than three times a week    Frequency of Social Gatherings with Friends and Family: Once a week    Attends Religious Services: More than 4 times per year    Active Member of [employer] or Organizations: Yes    Attends Banker Meetings: More than 4 times per year    Marital Status: Married  Catering Manager Violence: Not At Risk (12/18/2023)   Humiliation, Afraid, Rape, and Kick questionnaire    Fear of Current or Ex-Partner: No    Emotionally Abused: No    Physically Abused: No    Sexually Abused: No  Depression (PHQ2-9): Low Risk (11/12/2024)   Depression (PHQ2-9)    PHQ-2 Score: 0  Alcohol  Screen: Not on file  Housing:  Low Risk (12/20/2023)   Housing Stability Vital Sign    Unable to Pay  for Housing in the Last Year: No    Number of Times Moved in the Last Year: 0    Homeless in the Last Year: No  Utilities: Not At Risk (12/18/2023)   AHC Utilities    Threatened with loss of utilities: No  Health Literacy: Not on file   Family History:  Family History  Problem Relation Age of Onset   Other Mother        hx of DJD   Obesity Mother    Liver disease Father        ETOH, Hep C   Hypertension Father    Diabetes Father    Early death Father    Obesity Father    Colon cancer Neg Hx    Colon polyps Neg Hx    Esophageal cancer Neg Hx    Rectal cancer Neg Hx    Stomach cancer Neg Hx     Review of Systems: Constitutional: Doesn't report fevers, chills or abnormal weight loss Eyes: Doesn't report blurriness of vision Ears, nose, mouth, throat, and face: Doesn't report sore throat Respiratory: Doesn't report cough, dyspnea or wheezes Cardiovascular: Doesn't report palpitation, chest discomfort  Gastrointestinal:  Doesn't report nausea, constipation, diarrhea GU: Doesn't report incontinence Skin: Doesn't report skin rashes Neurological: Per HPI Musculoskeletal: Doesn't report joint pain Behavioral/Psych: Doesn't report anxiety  Physical Exam: Vitals:   12/10/24 0926  BP: (!) 112/93  Pulse: 85  Resp: 17  Temp: (!) 97.2 F (36.2 C)  SpO2: 100%     KPS: 70. General: Alert, cooperative, pleasant, in no acute distress Head: Normal EENT: No conjunctival injection or scleral icterus.  Lungs: Resp effort normal Cardiac: Regular rate Abdomen: Non-distended abdomen Skin: No rashes cyanosis or petechiae. Extremities: No clubbing or edema  Neurologic Exam: Mental Status: Awake, alert, attentive to examiner. Oriented to self and environment. Language is fluent with intact comprehension.  Cranial Nerves: Visual acuity is grossly normal. Visual fields are full. Extra-ocular movements intact. No  ptosis. Face is symmetric Motor: Tone and bulk are normal. Power is 4+/5 in left arm and 4/5 leg. Reflexes are symmetric, no pathologic reflexes present.  Sensory: Intact to light touch Gait: Hemiparetic   Labs: I have reviewed the data as listed    Component Value Date/Time   NA 139 12/10/2024 0906   K 3.8 12/10/2024 0906   CL 101 12/10/2024 0906   CO2 29 12/10/2024 0906   GLUCOSE 108 (H) 12/10/2024 0906   BUN 14 12/10/2024 0906   CREATININE 1.32 (H) 12/10/2024 0906   CALCIUM 9.9 12/10/2024 0906   PROT 7.9 12/10/2024 0906   ALBUMIN 4.7 12/10/2024 0906   AST 28 12/10/2024 0906   ALT 30 12/10/2024 0906   ALKPHOS 48 12/10/2024 0906   BILITOT 0.7 12/10/2024 0906   GFRNONAA >60 12/10/2024 0906   GFRAA >60 09/15/2015 1520   Lab Results  Component Value Date   WBC 3.2 (L) 12/10/2024   NEUTROABS 1.6 (L) 12/10/2024   HGB 15.9 12/10/2024   HCT 46.7 12/10/2024   MCV 86.6 12/10/2024   PLT 231 12/10/2024   Imaging:  [facility] Clinician Interpretation: I have personally reviewed the CNS images as listed.  My interpretation, in the context of the patient's clinical presentation, is stable disease  MR BRAIN W WO CONTRAST Result Date: 11/11/2024 EXAM: MRI BRAIN WITH AND WITHOUT CONTRAST 11/11/2024 01:18:53 PM TECHNIQUE: Multiplanar multisequence MRI of the head/brain was performed with and without the administration of intravenous contrast. COMPARISON: MRI head  09/06/24 CLINICAL  HISTORY: Brain/CNS neoplasm, assess treatment response FINDINGS: BRAIN AND VENTRICLES: No substantial change in enhancement along the margins of the resection cavity in the right front lobe. No evidence of new enhancement. Similar intrinsic T1 hyperintensity and susceptibility artifact compatible with prior hemorrhage. Similar surrounding T2 hyperintensity. No significant mass effect. No acute infarct. No acute intracranial hemorrhage. No hydrocephalus. Normal flow voids. ORBITS: No acute abnormality. SINUSES: No acute  abnormality. BONES AND SOFT TISSUES: Normal bone marrow signal and enhancement. No acute soft tissue abnormality. IMPRESSION: 1. No substantial change in enhancement along the margins of the resection cavity in the right front lobe with similar surrounding T2 hyperintensity. 2. No progressive findings or new acute abnormality. Electronically signed by: Gilmore Molt MD 11/11/2024 02:22 PM EST RP Workstation: [id number]   Assessment/Plan Frontal glioblastoma (HCC)  Focal seizure (HCC)  Khyren W Mcvay is clinically stable today, now in midst of cycle #7 Temodar  with concurrent avastin .  Labs are within normal limits.  We recommended continuing treatment with cycle #8 Temozolomide , 150 mg/m2, on for five days and off for twenty three days in twenty eight day cycles. The patient will have a complete blood count performed on days 21 and 28 of each cycle, and a comprehensive metabolic panel performed on day 28 of each cycle. Labs may need to be performed more often. Zofran  will prescribed for home use for nausea/vomiting.   Chemotherapy should be held for the following:  ANC less than 1,000  Platelets less than 100,000  LFT or creatinine greater than 2x ULN  If clinical concerns/contraindications develop   Cycle #8 can be dosed starting 12/28/24.  Decadron  should remain off if tolerated.  Recommended increasing Keppra  to 1500mg  BID due to recent breakthrough focal seizures.  Recommended continuing reduced dose Eliquis  to 2.5mg  BID.  We ask that [employer] return to clinic in 6 weeks with MRI brain prior to cycle #9, or sooner if needed.  All questions were answered. The patient knows to call the clinic with any problems, questions or concerns. No barriers to learning were detected.  The total time spent in the encounter was 30 minutes and more than 50% was on counseling and review of test results   Arthea MARLA Manns, MD Medical Director of Neuro-Oncology [facility] at [facility] 12/10/2024 9:47 AM

## 2024-12-10 NOTE — Telephone Encounter (Signed)
 Scheduled patient for next appointment. Called and spoke with the patient, he is aware.

## 2024-12-11 ENCOUNTER — Other Ambulatory Visit: Payer: Self-pay

## 2024-12-13 ENCOUNTER — Encounter: Payer: Self-pay | Admitting: Internal Medicine

## 2024-12-23 ENCOUNTER — Other Ambulatory Visit: Payer: Self-pay

## 2024-12-23 MED ORDER — PANTOPRAZOLE SODIUM 40 MG PO TBEC: 40.0000 mg | DELAYED_RELEASE_TABLET | Freq: Two times a day (BID) | ORAL | 3 refills | Status: DC

## 2024-12-24 ENCOUNTER — Ambulatory Visit: Admitting: Urology

## 2024-12-24 ENCOUNTER — Other Ambulatory Visit (HOSPITAL_COMMUNITY): Payer: Self-pay

## 2024-12-24 ENCOUNTER — Telehealth: Payer: Self-pay

## 2024-12-24 ENCOUNTER — Other Ambulatory Visit: Payer: Self-pay

## 2024-12-24 NOTE — Telephone Encounter (Signed)
 Pharmacy Patient Advocate Encounter  Received notification from [facility] that Prior Authorization for Pantoprazole  40 DR tabs has been APPROVED from 12/24/24 to 12/24/25. Ran test claim, Copay is $25.67 for 90 day supply. This test claim was processed through [facility]- copay amounts may vary at other pharmacies due to pharmacy/plan contracts, or as the patient moves through the different stages of their insurance plan.   PA #/Case ID/Reference #: # [id number]

## 2024-12-24 NOTE — Telephone Encounter (Signed)
 Pharmacy Patient Advocate Encounter   Received notification from Onbase that prior authorization for Pantoprazole  40 DR tabs is required/requested.   Insurance verification completed.   The patient is insured through [facility].   Per test claim: PA required; PA submitted to above mentioned insurance via Latent Key/confirmation #/EOC [id number] Status is pending

## 2024-12-30 ENCOUNTER — Other Ambulatory Visit (HOSPITAL_COMMUNITY): Payer: Self-pay

## 2024-12-30 ENCOUNTER — Encounter: Payer: Self-pay | Admitting: Internal Medicine

## 2024-12-30 ENCOUNTER — Telehealth: Payer: Self-pay

## 2024-12-30 NOTE — Telephone Encounter (Signed)
 Oral Oncology Patient Advocate Encounter   Received notification that prior authorization for temozolomide  (TEMODAR ) 100 MG capsule  is required.   PA submitted on 12/30/24 Submitted through PromptPA ID: [id number] Status is pending     Lucie Lamer, CPhT [place]  [facility] Oncology Pharmacy Patient Advocate Specialist [facility] [place] Phone: [phone]  Fax: [phone] [email or web address]

## 2024-12-31 NOTE — Telephone Encounter (Signed)
 Oral Oncology Patient Advocate Encounter  Prior Authorization for Temozolomide  has been approved.    PA# [id number] Effective dates: 12/30/24 through 12/29/25  Patient must get at [facility], CPhT [place]  [facility] Oncology Pharmacy Patient Advocate Specialist [facility] [place] Phone: [phone]  Fax: [phone] [email or web address]

## 2025-01-03 ENCOUNTER — Other Ambulatory Visit (HOSPITAL_COMMUNITY): Payer: Self-pay

## 2025-01-07 ENCOUNTER — Other Ambulatory Visit: Payer: Self-pay | Admitting: Internal Medicine

## 2025-01-09 ENCOUNTER — Encounter: Payer: Self-pay | Admitting: Internal Medicine

## 2025-01-16 ENCOUNTER — Encounter: Payer: Self-pay | Admitting: Internal Medicine

## 2025-01-16 ENCOUNTER — Telehealth: Payer: Self-pay | Admitting: *Deleted

## 2025-01-16 NOTE — Telephone Encounter (Signed)
 PC to patient, informed him me need to change Monday's appointment due to possiblity of inclement weather.  Appointments changed to Thursday, 01/23/25 - labs at 3:00, MD visit at 3:30, he verbalizes understanding.

## 2025-01-17 ENCOUNTER — Ambulatory Visit (HOSPITAL_COMMUNITY)
Admission: RE | Admit: 2025-01-17 | Discharge: 2025-01-17 | Disposition: A | Source: Ambulatory Visit | Attending: Internal Medicine

## 2025-01-17 DIAGNOSIS — C711 Malignant neoplasm of frontal lobe: Secondary | ICD-10-CM | POA: Diagnosis present

## 2025-01-17 MED ORDER — GADOBUTROL 1 MMOL/ML IV SOLN
10.0000 mL | Freq: Once | INTRAVENOUS | Status: AC | PRN
  Administered 2025-01-17: 10 mL via INTRAVENOUS

## 2025-01-20 ENCOUNTER — Inpatient Hospital Stay: Admitting: Internal Medicine

## 2025-01-20 ENCOUNTER — Inpatient Hospital Stay: Attending: Radiation Oncology

## 2025-01-20 ENCOUNTER — Inpatient Hospital Stay: Admitting: Licensed Clinical Social Worker

## 2025-01-23 ENCOUNTER — Inpatient Hospital Stay: Attending: Radiation Oncology | Admitting: Internal Medicine

## 2025-01-23 ENCOUNTER — Inpatient Hospital Stay: Admitting: Licensed Clinical Social Worker

## 2025-01-23 ENCOUNTER — Inpatient Hospital Stay

## 2025-01-23 VITALS — BP 139/86 | HR 64 | Temp 97.6°F | Resp 16 | Ht 74.0 in | Wt 270.5 lb

## 2025-01-23 DIAGNOSIS — C711 Malignant neoplasm of frontal lobe: Secondary | ICD-10-CM | POA: Diagnosis present

## 2025-01-23 DIAGNOSIS — G40909 Epilepsy, unspecified, not intractable, without status epilepticus: Secondary | ICD-10-CM | POA: Diagnosis not present

## 2025-01-23 DIAGNOSIS — Z79899 Other long term (current) drug therapy: Secondary | ICD-10-CM | POA: Insufficient documentation

## 2025-01-23 LAB — CMP (CANCER CENTER ONLY)
ALT: 23 U/L (ref 0–44)
AST: 23 U/L (ref 15–41)
Albumin: 4.5 g/dL (ref 3.5–5.0)
Alkaline Phosphatase: 59 U/L (ref 38–126)
Anion gap: 11 (ref 5–15)
BUN: 12 mg/dL (ref 6–20)
CO2: 28 mmol/L (ref 22–32)
Calcium: 9.9 mg/dL (ref 8.9–10.3)
Chloride: 102 mmol/L (ref 98–111)
Creatinine: 1.02 mg/dL (ref 0.61–1.24)
GFR, Estimated: >60 mL/min
Glucose, Bld: 115 mg/dL — ABNORMAL HIGH (ref 70–99)
Potassium: 3.5 mmol/L (ref 3.5–5.1)
Sodium: 140 mmol/L (ref 135–145)
Total Bilirubin: 0.5 mg/dL (ref 0.0–1.2)
Total Protein: 7.7 g/dL (ref 6.5–8.1)

## 2025-01-23 LAB — CBC WITH DIFFERENTIAL (CANCER CENTER ONLY)
Abs Immature Granulocytes: 0.01 10*3/uL (ref 0.00–0.07)
Basophils Absolute: 0 10*3/uL (ref 0.0–0.1)
Basophils Relative: 1 %
Eosinophils Absolute: 0.1 10*3/uL (ref 0.0–0.5)
Eosinophils Relative: 3 %
HCT: 44.4 % (ref 39.0–52.0)
Hemoglobin: 15 g/dL (ref 13.0–17.0)
Immature Granulocytes: 0 %
Lymphocytes Relative: 31 %
Lymphs Abs: 1.2 10*3/uL (ref 0.7–4.0)
MCH: 28.9 pg (ref 26.0–34.0)
MCHC: 33.8 g/dL (ref 30.0–36.0)
MCV: 85.5 fL (ref 80.0–100.0)
Monocytes Absolute: 0.3 10*3/uL (ref 0.1–1.0)
Monocytes Relative: 8 %
Neutro Abs: 2.2 10*3/uL (ref 1.7–7.7)
Neutrophils Relative %: 57 %
Platelet Count: 211 10*3/uL (ref 150–400)
RBC: 5.19 MIL/uL (ref 4.22–5.81)
RDW: 13.7 % (ref 11.5–15.5)
WBC Count: 3.9 10*3/uL — ABNORMAL LOW (ref 4.0–10.5)
nRBC: 0 % (ref 0.0–0.2)

## 2025-01-23 NOTE — Progress Notes (Signed)
 "  [facility] [address]  [place], [place] [address] [phone]   Interval Evaluation  Date of Service: 01/23/25 Patient Name: Nicholas Stevens Patient MRN: [id number] Patient DOB: [birth year] Provider: Arthea MARLA Manns, MD  Identifying Statement:  Nicholas Stevens is a 54 y.o. male with right frontal glioblastoma    Oncologic History: Oncology History  Frontal glioblastoma (HCC)  12/18/2023 Surgery   Craniotomy, resection of right frontal mass with Dr. Debby; path is glioblastoma, IDH pending   01/22/2024 - 03/01/2024 Radiation Therapy   Completes 6 weeks IMRT and concurrent Temodar  Valene)   03/12/2024 -  Chemotherapy   Initiates adjuvant 5-day Temozolomide  150-200mg /m2    06/11/2024 - 09/23/2024 Chemotherapy   Doses Avastin  7.5-10mg /kg IV q2-3 weeks for symptomatic radionecrosis refractory to steroids        Interval History: KEMANI DEMARAIS presents today having completed cycle #8 Temozolomide , recent MRI brain.  No further seizures since last month's visit.  Remains on Keppra  1500mg  twice per day.  Today he again describes ongoing improvement in aches and pain in his shoulders knees, which he had noticed following avastin  treatments.  Last infusion was on 09/23/24.  He remains off decadron .  Denies new or progressive neurologic changes today.  Denies headaches.  Prior- presents for follow up after recent clinical changes.  He describes relatively rapid onset of left sided weakness starting ~10 days ago.  He was unable to walk and use the left arm meaningfully.  No seizure activity appreciated.  Decadron  was started 4mg  twice per day after 3-4 days of the weakness.  Once the steroid was started, he returned back to his prior baseline.  Continues on the Keppra .  Denies severe headaches.   H+P (01/08/24) Patient presented with several days of left sided clumsiness, impaired coordination.  Was found to have large right frontal  mass, c/w primary brain tumor.  He underwent craniotomy, resection with Dr. Debby on 12/18/23; path demonstrated glioblastoma.  Following surgery, he had persistent right sided weakness, he is currently in outpatient PT following discharge from rehab.  No seizures, headaches.     Medications: Current Outpatient Medications on File Prior to Visit  Medication Sig Dispense Refill   Baclofen  5 MG TABS Take 1 tablet (5 mg total) by mouth every 6 (six) hours as needed (hiccups). 90 tablet 1   camphor-menthol  (SARNA) lotion Apply topically as needed for itching. 222 mL 0   celecoxib  (CELEBREX ) 200 MG capsule TAKE 1 CAPSULE BY MOUTH 1 TO 2 TIMES DAILY AFTER A MEAL AS NEEDED FOR PAIN 180 capsule 0   chlorhexidine  (PERIDEX ) 0.12 % solution Use as directed 15 mLs in the mouth or throat 3 (three) times daily.     Cyanocobalamin  (B-12 PO) Take 1 tablet by mouth daily.     ELIQUIS  2.5 MG TABS tablet TAKE 1 TABLET(2.5 MG) BY MOUTH TWICE DAILY 60 tablet 2   fluconazole  (DIFLUCAN ) 100 MG tablet Take 2 tabs on day#1, then 1 tab daily on Days #2-10 11 tablet 1   furosemide  (LASIX ) 20 MG tablet Take 1 tablet (20 mg total) by mouth daily as needed. 30 tablet 0   latanoprost  (XALATAN ) 0.005 % ophthalmic solution INSTILL 1 DROP IN BOTH EYES AT BEDTIME 2.5 mL 2   levETIRAcetam  (KEPPRA ) 500 MG tablet Take 3 tablets (1,500 mg total) by mouth 2 (two) times daily. 120 tablet 3   magic mouthwash (nystatin , lidocaine , diphenhydrAMINE , alum & mag hydroxide) suspension Swish and  swallow 5 mLs by mouth 4 (four) times daily as needed for mouth pain. 140 mL 1   Multiple Vitamin (MULTIVITAMIN WITH MINERALS) TABS tablet Take 1 tablet by mouth daily.     olmesartan  (BENICAR ) 20 MG tablet Take 1 tablet (20 mg total) by mouth daily. 90 tablet 3   Omega-3 Fatty Acids (FISH OIL) 1000 MG CAPS Take 1,000 mg by mouth daily.     pantoprazole  (PROTONIX ) 40 MG tablet Take 1 tablet (40 mg total) by mouth 2 (two) times daily. 180 tablet 3    pimecrolimus  (ELIDEL ) 1 % cream Apply 1 Application topically 2 (two) times daily as needed (Dermatitis).     senna-docusate (SENOKOT-S) 8.6-50 MG tablet Take 2 tablets by mouth daily at 6 (six) AM. 60 tablet 0   temozolomide  (TEMODAR ) 100 MG capsule Take 4 capsules (400 mg total) by mouth daily. Take for 5 days on, 23 days off. Repeat every 28 days. Take on an empty stomach 1 hour before or 2 hours after a meal. 20 capsule 0   temozolomide  (TEMODAR ) 100 MG capsule Take 1 capsule (100 mg total) by mouth daily. Take for 5 days on, 23 days off. Repeat every 28 days. Take on an empty stomach 1 hour before or 2 hours after a meal. 5 capsule 0   temozolomide  (TEMODAR ) 100 MG capsule Take 4 capsules (400 mg total) by mouth daily. Take for 5 days on, 23 days off. Repeat every 28 days. Take on an empty stomach 1 hour before or 2 hours after a meal. 20 capsule 0   temozolomide  (TEMODAR ) 100 MG capsule Take 4 capsules (400 mg total) by mouth daily. Take for 5 days on, 23 days off. Repeat every 28 days. Take on an empty stomach 1 hour before or 2 hours after a meal. 20 capsule 0   triamcinolone  cream (KENALOG ) 0.1 % Apply 1 Application topically daily. Use on affected areas for 2 weeks, then stop for 2 weeks. Repeat until clear if needed. 453 g 0   triamterene -hydrochlorothiazide  (MAXZIDE -25) 37.5-25 MG tablet Take 1 tablet by mouth daily. 90 tablet 3   zolpidem  (AMBIEN ) 10 MG tablet TAKE 1 TABLET BY MOUTH EVERYDAY AT BEDTIME 90 tablet 1   No current facility-administered medications on file prior to visit.    Allergies: No Known Allergies Past Medical History:  Past Medical History:  Diagnosis Date   Arthritis    DDD (degenerative disc disease), lumbar    HNP (herniated nucleus pulposus)    Hypertension    Liver hemangioma    Morbid obesity (HCC)    OSA (obstructive sleep apnea)    no cpap used since weight loss   Overweight(278.02)    Plantar fasciitis of right foot    Sleep apnea    Tinea barbae     Past Surgical History:  Past Surgical History:  Procedure Laterality Date   APPENDECTOMY     BREATH TEK H PYLORI N/A 08/20/2013   Procedure: BREATH TEK H PYLORI;  Surgeon: Donnice KATHEE Lunger, MD;  Location: [facility];  Service: General;  Laterality: N/A;   COLONOSCOPY WITH PROPOFOL  N/A 05/05/2022   Procedure: COLONOSCOPY WITH PROPOFOL ;  Surgeon: Albertus Gordy HERO, MD;  Location: [facility];  Service: Gastroenterology;  Laterality: N/A;   CRANIOTOMY Right 12/18/2023   Procedure: Right Frontal Stereotactic Craniotomy for Resection of Tumor;  Surgeon: Debby Dorn MATSU, MD;  Location: [facility];  Service: Neurosurgery;  Laterality: Right;   ESOPHAGOGASTRODUODENOSCOPY (EGD) WITH PROPOFOL  N/A 03/15/2023  Procedure: ESOPHAGOGASTRODUODENOSCOPY (EGD) WITH PROPOFOL ;  Surgeon: Avram Lupita BRAVO, MD;  Location: [facility];  Service: Gastroenterology;  Laterality: N/A;   LAPAROSCOPIC APPENDECTOMY  12/26/2005   Dr Curvin   LAPAROSCOPIC GASTRIC SLEEVE RESECTION N/A 10/08/2013   Procedure: LAPAROSCOPIC SLEEVE GASTRECTOMY;  Surgeon: Donnice KATHEE Lunger, MD;  Location: [facility];  Service: General;  Laterality: N/A;   LUMBAR LAMINECTOMY/DECOMPRESSION MICRODISCECTOMY N/A 09/16/2015   Procedure: MICRO LUMBAR DECOMPRESSION, MICRODISCECTOMY L5 - S1;  Surgeon: Reyes Billing, MD;  Location: [facility];  Service: Orthopedics;  Laterality: N/A;   POLYPECTOMY  05/05/2022   Procedure: POLYPECTOMY;  Surgeon: Albertus Gordy HERO, MD;  Location: [facility];  Service: Gastroenterology;;   ROTATOR CUFF REPAIR Right 12/27/2003   Dr Harden   SPINE SURGERY     Social History:  Social History   Socioeconomic History   Marital status: Married    Spouse name: Zubin Pontillo   Number of children: 3   Years of education: Not on file   Highest education level: 12th grade  Occupational History   Occupation: Doctor, General Practice PD    Employer: UNEMPLOYED  Tobacco Use   Smoking status: Never    Passive exposure: Never   Smokeless tobacco: Never  Vaping  Use   Vaping status: Never Used  Substance and Sexual Activity   Alcohol  use: No   Drug use: No   Sexual activity: Not on file  Other Topics Concern   Not on file  Social History Narrative   Married 18+ years   3 children - ages approx 8, 43 and 5...daughter age 85 w/ DM type 1   Social Drivers of Health   Tobacco Use: Low Risk (10/31/2024)   Patient History    Smoking Tobacco Use: Never    Smokeless Tobacco Use: Never    Passive Exposure: Never  Financial Resource Strain: Low Risk (04/02/2023)   Overall Financial Resource Strain (CARDIA)    Difficulty of Paying Living Expenses: Not hard at all  Food Insecurity: No Food Insecurity (12/18/2023)   Hunger Vital Sign    Worried About Running Out of Food in the Last Year: Never true    Ran Out of Food in the Last Year: Never true  Transportation Needs: No Transportation Needs (12/18/2023)   PRAPARE - Administrator, Civil Service (Medical): No    Lack of Transportation (Non-Medical): No  Physical Activity: Insufficiently Active (04/02/2023)   Exercise Vital Sign    Days of Exercise per Week: 4 days    Minutes of Exercise per Session: 30 min  Stress: No Stress Concern Present (04/02/2023)   [employer] of Occupational Health - Occupational Stress Questionnaire    Feeling of Stress : Not at all  Social Connections: Socially Integrated (04/02/2023)   Social Connection and Isolation Panel    Frequency of Communication with Friends and Family: More than three times a week    Frequency of Social Gatherings with Friends and Family: Once a week    Attends Religious Services: More than 4 times per year    Active Member of [employer] or Organizations: Yes    Attends Banker Meetings: More than 4 times per year    Marital Status: Married  Catering Manager Violence: Not At Risk (12/18/2023)   Humiliation, Afraid, Rape, and Kick questionnaire    Fear of Current or Ex-Partner: No    Emotionally Abused: No    Physically  Abused: No    Sexually Abused: No  Depression (PHQ2-9): Low Risk (11/12/2024)  Depression (PHQ2-9)    PHQ-2 Score: 0  Alcohol  Screen: Not on file  Housing: Low Risk (12/20/2023)   Housing Stability Vital Sign    Unable to Pay for Housing in the Last Year: No    Number of Times Moved in the Last Year: 0    Homeless in the Last Year: No  Utilities: Not At Risk (12/18/2023)   AHC Utilities    Threatened with loss of utilities: No  Health Literacy: Not on file   Family History:  Family History  Problem Relation Age of Onset   Other Mother        hx of DJD   Obesity Mother    Liver disease Father        ETOH, Hep C   Hypertension Father    Diabetes Father    Early death Father    Obesity Father    Colon cancer Neg Hx    Colon polyps Neg Hx    Esophageal cancer Neg Hx    Rectal cancer Neg Hx    Stomach cancer Neg Hx     Review of Systems: Constitutional: Doesn't report fevers, chills or abnormal weight loss Eyes: Doesn't report blurriness of vision Ears, nose, mouth, throat, and face: Doesn't report sore throat Respiratory: Doesn't report cough, dyspnea or wheezes Cardiovascular: Doesn't report palpitation, chest discomfort  Gastrointestinal:  Doesn't report nausea, constipation, diarrhea GU: Doesn't report incontinence Skin: Doesn't report skin rashes Neurological: Per HPI Musculoskeletal: Doesn't report joint pain Behavioral/Psych: Doesn't report anxiety  Physical Exam: Vitals:   01/23/25 1534  BP: 139/86  Pulse: 64  Resp: 16  Temp: 97.6 F (36.4 C)  SpO2: 100%   KPS: 70. General: Alert, cooperative, pleasant, in no acute distress Head: Normal EENT: No conjunctival injection or scleral icterus.  Lungs: Resp effort normal Cardiac: Regular rate Abdomen: Non-distended abdomen Skin: No rashes cyanosis or petechiae. Extremities: No clubbing or edema  Neurologic Exam: Mental Status: Awake, alert, attentive to examiner. Oriented to self and environment.  Language is fluent with intact comprehension.  Cranial Nerves: Visual acuity is grossly normal. Visual fields are full. Extra-ocular movements intact. No ptosis. Face is symmetric Motor: Tone and bulk are normal. Power is 4+/5 in left arm and 4/5 leg. Reflexes are symmetric, no pathologic reflexes present.  Sensory: Intact to light touch Gait: Hemiparetic   Labs: I have reviewed the data as listed    Component Value Date/Time   NA 139 12/10/2024 0906   K 3.8 12/10/2024 0906   CL 101 12/10/2024 0906   CO2 29 12/10/2024 0906   GLUCOSE 108 (H) 12/10/2024 0906   BUN 14 12/10/2024 0906   CREATININE 1.32 (H) 12/10/2024 0906   CALCIUM 9.9 12/10/2024 0906   PROT 7.9 12/10/2024 0906   ALBUMIN 4.7 12/10/2024 0906   AST 28 12/10/2024 0906   ALT 30 12/10/2024 0906   ALKPHOS 48 12/10/2024 0906   BILITOT 0.7 12/10/2024 0906   GFRNONAA >60 12/10/2024 0906   GFRAA >60 09/15/2015 1520   Lab Results  Component Value Date   WBC 3.2 (L) 12/10/2024   NEUTROABS 1.6 (L) 12/10/2024   HGB 15.9 12/10/2024   HCT 46.7 12/10/2024   MCV 86.6 12/10/2024   PLT 231 12/10/2024   Imaging:  [facility] Clinician Interpretation: I have personally reviewed the CNS images as listed.  My interpretation, in the context of the patient's clinical presentation, is treatment effect vs true progression  MR BRAIN W WO CONTRAST Result Date: 01/17/2025 EXAM: MRI BRAIN  WITH AND WITHOUT CONTRAST 01/17/2025 02:00:46 PM TECHNIQUE: Multiplanar multisequence MRI of the head/brain was performed with and without the administration of intravenous contrast. CONTRAST: 10 mL of Gadavist . COMPARISON: MR Head without then with IV contrast 11/11/2024. CLINICAL HISTORY: Brain/CNS neoplasm, assess treatment response. FINDINGS: BRAIN AND VENTRICLES: Postoperative changes from prior right frontal craniotomy and debulking of the tumor in the posterior aspect of the right superior frontal gyrus. Increased enhancement of the resection cavity margins  with new, more nodular appearing areas of resection along the anterior resection cavity margin seen on axial image 130 series 16, and the posterior margin of the resection cavity seen on axial image 142 series 16. Unchanged extent of infiltrative appearing T2 hyperintensity extending anteriorly and inferiorly from the resection cavity to involve the right cingulate gyrus, right middle frontal gyrus and right anterior insula. Old blood products within the resection cavity. No acute infarct. No acute intracranial hemorrhage. No mass effect or midline shift. No hydrocephalus. The sella is unremarkable. Normal flow voids. ORBITS: No significant abnormality. SINUSES: No significant abnormality. BONES AND SOFT TISSUES: Normal bone marrow signal. No soft tissue abnormality. IMPRESSION: 1. Increased enhancement along the high right frontal resection cavity margins with new nodular areas of enhancement along the anterior and posterior margins. Shorter interval follow-up is recommended. 2. Unchanged extent of infiltrative T2 hyperintensity extending anteriorly and inferiorly from the resection cavity to involve the right cingulate gyrus, right middle frontal gyrus, and right anterior insula. Electronically signed by: Ryan Chess MD 01/17/2025 02:58 PM EST RP Workstation: [id number]   Assessment/Plan Frontal glioblastoma (HCC)  Seizure disorder (HCC)  Tacari W Lofgren is clinically stable today, now having completed cycle #8 Temodar .  Labs remain within normal limits.  MRI brain demonstrates modest increase in enhancement which is symmetric and may be consistent with avastin  washout artifact.   Will recommend continuing current treatment plan and close imaging surveillance given possibility of tumor recurrence.  We recommended continuing treatment with cycle #9 Temozolomide , 150 mg/m2, on for five days and off for twenty three days in twenty eight day cycles. The patient will have a complete blood count  performed on days 21 and 28 of each cycle, and a comprehensive metabolic panel performed on day 28 of each cycle. Labs may need to be performed more often. Zofran  will prescribed for home use for nausea/vomiting.   Chemotherapy should be held for the following:  ANC less than 1,000  Platelets less than 100,000  LFT or creatinine greater than 2x ULN  If clinical concerns/contraindications develop  Decadron  should remain off if tolerated.  Recommended continuing Keppra  1500mg  BID after recent breakthrough focal seizures.  Recommended continuing reduced dose Eliquis  to 2.5mg  BID.  We ask that [employer] return to clinic in 4 weeks with MRI brain prior to cycle #10, or sooner if needed.  All questions were answered. The patient knows to call the clinic with any problems, questions or concerns. No barriers to learning were detected.  The total time spent in the encounter was 40 minutes and more than 50% was on counseling and review of test results   Arthea MARLA Manns, MD Medical Director of Neuro-Oncology [facility] [place] 01/23/25 3:30 PM "

## 2025-01-26 ENCOUNTER — Other Ambulatory Visit: Payer: Self-pay | Admitting: Internal Medicine

## 2025-01-26 DIAGNOSIS — C711 Malignant neoplasm of frontal lobe: Secondary | ICD-10-CM

## 2025-01-28 ENCOUNTER — Telehealth: Payer: Self-pay | Admitting: Internal Medicine

## 2025-01-28 ENCOUNTER — Other Ambulatory Visit: Payer: Self-pay | Admitting: Internal Medicine

## 2025-01-30 ENCOUNTER — Encounter (HOSPITAL_BASED_OUTPATIENT_CLINIC_OR_DEPARTMENT_OTHER): Payer: Self-pay | Admitting: Pulmonary Disease

## 2025-01-31 ENCOUNTER — Other Ambulatory Visit: Payer: Self-pay | Admitting: Internal Medicine

## 2025-02-14 ENCOUNTER — Ambulatory Visit (HOSPITAL_COMMUNITY)

## 2025-02-20 ENCOUNTER — Inpatient Hospital Stay: Admitting: Internal Medicine

## 2025-02-20 ENCOUNTER — Inpatient Hospital Stay: Attending: Radiation Oncology

## 2025-03-07 ENCOUNTER — Ambulatory Visit: Admitting: Urology
